# Patient Record
Sex: Female | Born: 1945
Health system: Southern US, Community
[De-identification: ages and names within clinical notes are randomized; demographics above are authoritative.]

## PROBLEM LIST (undated history)

## (undated) DIAGNOSIS — E119 Type 2 diabetes mellitus without complications: Secondary | ICD-10-CM

## (undated) DIAGNOSIS — E785 Hyperlipidemia, unspecified: Secondary | ICD-10-CM

## (undated) DIAGNOSIS — B059 Measles without complication: Secondary | ICD-10-CM

## (undated) DIAGNOSIS — M1990 Unspecified osteoarthritis, unspecified site: Secondary | ICD-10-CM

## (undated) DIAGNOSIS — N993 Prolapse of vaginal vault 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: Secondary | ICD-10-CM

## (undated) DIAGNOSIS — I10 Essential (primary) hypertension: Secondary | ICD-10-CM

## (undated) DIAGNOSIS — K635 Polyp of colon: Secondary | ICD-10-CM

## (undated) DIAGNOSIS — K08109 Complete loss of teeth, unspecified cause, unspecified class: Secondary | ICD-10-CM

## (undated) DIAGNOSIS — D649 Anemia, unspecified: Secondary | ICD-10-CM

## (undated) DIAGNOSIS — K259 Gastric ulcer, unspecified as acute or chronic, without hemorrhage or perforation: Secondary | ICD-10-CM

## (undated) DIAGNOSIS — Z96642 Presence of left artificial hip joint: Secondary | ICD-10-CM

## (undated) DIAGNOSIS — Z96659 Presence of unspecified artificial knee joint: Secondary | ICD-10-CM

## (undated) DIAGNOSIS — Z960 Presence of urogenital implants: Secondary | ICD-10-CM

## (undated) DIAGNOSIS — E876 Hypokalemia: Secondary | ICD-10-CM

## (undated) DIAGNOSIS — N3946 Mixed incontinence: Secondary | ICD-10-CM

## (undated) DIAGNOSIS — Z972 Presence of dental prosthetic device (complete) (partial): Secondary | ICD-10-CM

## (undated) DIAGNOSIS — N183 Chronic kidney disease, stage 3 unspecified: Secondary | ICD-10-CM

## (undated) DIAGNOSIS — N189 Chronic kidney disease, unspecified: Secondary | ICD-10-CM

## (undated) DIAGNOSIS — G43909 Migraine, unspecified, not intractable, without status migrainosus: Secondary | ICD-10-CM

## (undated) DIAGNOSIS — Z780 Asymptomatic menopausal state: Secondary | ICD-10-CM

## (undated) DIAGNOSIS — Z973 Presence of spectacles and contact lenses: Secondary | ICD-10-CM

## (undated) HISTORY — DX: Anemia, unspecified: D64.9

## (undated) HISTORY — DX: Presence of left artificial hip joint: Z96.642

## (undated) HISTORY — PX: CATARACT EXTRACTION: SUR2

## (undated) HISTORY — DX: Hypokalemia: E87.6

## (undated) HISTORY — DX: Unspecified osteoarthritis, unspecified site: M19.90

## (undated) HISTORY — DX: Hyperlipidemia, unspecified: E78.5

## (undated) HISTORY — DX: Migraine, unspecified, not intractable, without status migrainosus: G43.909

## (undated) HISTORY — DX: Polyp of colon: K63.5

## (undated) HISTORY — DX: Presence of unspecified artificial knee joint: Z96.659

## (undated) HISTORY — DX: Chronic kidney disease, unspecified: N18.9

## (undated) HISTORY — PX: RETINAL DETACHMENT SURGERY: SHX105

## (undated) HISTORY — DX: Type 2 diabetes mellitus without complications: E11.9

## (undated) HISTORY — PX: JOINT REPLACEMENT: SHX530

## (undated) HISTORY — PX: CARPAL TUNNEL RELEASE: SHX101

## (undated) HISTORY — DX: Gastric ulcer, unspecified as acute or chronic, without hemorrhage or perforation: K25.9

## (undated) HISTORY — DX: Essential (primary) hypertension: I10

---

## 1998-01-21 ENCOUNTER — Ambulatory Visit (HOSPITAL_BASED_OUTPATIENT_CLINIC_OR_DEPARTMENT_OTHER): Admission: RE | Admit: 1998-01-21 | Discharge: 1998-01-21 | Payer: Self-pay | Admitting: Orthopedic Surgery

## 2000-11-07 ENCOUNTER — Other Ambulatory Visit: Admission: RE | Admit: 2000-11-07 | Discharge: 2000-11-07 | Payer: Self-pay | Admitting: Obstetrics and Gynecology

## 2001-05-26 ENCOUNTER — Ambulatory Visit (HOSPITAL_COMMUNITY): Admission: RE | Admit: 2001-05-26 | Discharge: 2001-05-27 | Payer: Self-pay | Admitting: Ophthalmology

## 2001-05-26 ENCOUNTER — Encounter: Payer: Self-pay | Admitting: Ophthalmology

## 2008-02-22 ENCOUNTER — Ambulatory Visit: Payer: Self-pay | Admitting: Obstetrics & Gynecology

## 2008-02-23 ENCOUNTER — Ambulatory Visit (HOSPITAL_COMMUNITY): Admission: RE | Admit: 2008-02-23 | Discharge: 2008-02-23 | Payer: Self-pay | Admitting: Obstetrics & Gynecology

## 2008-04-22 ENCOUNTER — Encounter: Payer: Self-pay | Admitting: Obstetrics & Gynecology

## 2008-04-22 ENCOUNTER — Inpatient Hospital Stay (HOSPITAL_COMMUNITY): Admission: RE | Admit: 2008-04-22 | Discharge: 2008-04-23 | Payer: Self-pay | Admitting: Obstetrics & Gynecology

## 2008-04-22 ENCOUNTER — Ambulatory Visit: Payer: Self-pay | Admitting: Obstetrics & Gynecology

## 2008-05-30 ENCOUNTER — Ambulatory Visit: Payer: Self-pay | Admitting: Obstetrics & Gynecology

## 2008-07-31 ENCOUNTER — Ambulatory Visit: Payer: Self-pay | Admitting: Obstetrics & Gynecology

## 2008-08-22 ENCOUNTER — Ambulatory Visit: Payer: Self-pay | Admitting: Obstetrics & Gynecology

## 2008-09-20 ENCOUNTER — Ambulatory Visit: Payer: Self-pay | Admitting: Obstetrics & Gynecology

## 2008-09-20 ENCOUNTER — Encounter: Payer: Self-pay | Admitting: Obstetrics and Gynecology

## 2008-09-23 ENCOUNTER — Ambulatory Visit (HOSPITAL_COMMUNITY): Admission: RE | Admit: 2008-09-23 | Discharge: 2008-09-23 | Payer: Self-pay | Admitting: Obstetrics and Gynecology

## 2008-10-11 ENCOUNTER — Ambulatory Visit: Payer: Self-pay | Admitting: Obstetrics & Gynecology

## 2009-03-15 DIAGNOSIS — Z96642 Presence of left artificial hip joint: Secondary | ICD-10-CM

## 2009-03-15 HISTORY — DX: Presence of left artificial hip joint: Z96.642

## 2009-03-15 HISTORY — PX: HIP ARTHROPLASTY: SHX981

## 2009-03-19 ENCOUNTER — Ambulatory Visit: Payer: Self-pay | Admitting: Family Medicine

## 2009-04-19 ENCOUNTER — Encounter: Payer: Self-pay | Admitting: Cardiology

## 2009-09-29 ENCOUNTER — Ambulatory Visit: Payer: Self-pay | Admitting: Obstetrics & Gynecology

## 2009-10-09 ENCOUNTER — Ambulatory Visit (HOSPITAL_COMMUNITY): Admission: RE | Admit: 2009-10-09 | Discharge: 2009-10-09 | Payer: Self-pay | Admitting: Obstetrics & Gynecology

## 2009-10-21 ENCOUNTER — Encounter
Admission: RE | Admit: 2009-10-21 | Discharge: 2009-10-21 | Payer: Self-pay | Source: Home / Self Care | Admitting: Obstetrics & Gynecology

## 2009-10-24 ENCOUNTER — Encounter: Admission: RE | Admit: 2009-10-24 | Discharge: 2009-10-24 | Payer: Self-pay | Admitting: Obstetrics & Gynecology

## 2010-02-16 ENCOUNTER — Encounter: Payer: Self-pay | Admitting: Cardiology

## 2010-02-17 ENCOUNTER — Encounter: Payer: Self-pay | Admitting: Cardiology

## 2010-02-25 ENCOUNTER — Ambulatory Visit: Payer: Self-pay | Admitting: Cardiology

## 2010-02-25 DIAGNOSIS — E663 Overweight: Secondary | ICD-10-CM

## 2010-02-25 DIAGNOSIS — I10 Essential (primary) hypertension: Secondary | ICD-10-CM

## 2010-02-25 DIAGNOSIS — E1159 Type 2 diabetes mellitus with other circulatory complications: Secondary | ICD-10-CM | POA: Insufficient documentation

## 2010-02-26 ENCOUNTER — Encounter: Payer: Self-pay | Admitting: Cardiology

## 2010-02-26 ENCOUNTER — Ambulatory Visit (HOSPITAL_COMMUNITY)
Admission: RE | Admit: 2010-02-26 | Discharge: 2010-02-26 | Payer: Self-pay | Source: Home / Self Care | Attending: Internal Medicine | Admitting: Internal Medicine

## 2010-02-26 ENCOUNTER — Ambulatory Visit: Payer: Self-pay

## 2010-02-27 ENCOUNTER — Encounter: Payer: Self-pay | Admitting: Cardiology

## 2010-02-27 ENCOUNTER — Telehealth: Payer: Self-pay | Admitting: Cardiology

## 2010-03-02 ENCOUNTER — Inpatient Hospital Stay (HOSPITAL_COMMUNITY)
Admission: RE | Admit: 2010-03-02 | Discharge: 2010-03-06 | Payer: Self-pay | Source: Home / Self Care | Attending: Orthopedic Surgery | Admitting: Orthopedic Surgery

## 2010-03-15 HISTORY — PX: TOTAL VAGINAL HYSTERECTOMY: SHX2548

## 2010-03-30 ENCOUNTER — Ambulatory Visit: Admit: 2010-03-30 | Payer: Self-pay | Admitting: Obstetrics & Gynecology

## 2010-04-05 ENCOUNTER — Encounter: Payer: Self-pay | Admitting: Obstetrics & Gynecology

## 2010-04-15 NOTE — Discharge Summary (Signed)
[name], [name]               ACCOUNT NO.:  [id number]  MEDICAL RECORD NO.:  [id number]          PATIENT TYPE:  INP  LOCATION:  1617                         FACILITY:  [facility]  PHYSICIAN:  Ollen Gross, M.D.    DATE OF BIRTH:  1946-01-29  DATE OF ADMISSION:  03/02/2010 DATE OF DISCHARGE:  03/06/2010                              DISCHARGE SUMMARY   ADMITTING DIAGNOSES: 1. Osteoarthritis, left hip. 2. Migraines. 3. Cataracts. 4. Borderline hypertension. 5. Degenerative disk disease. 6. Postmenopausal. 7. Childhood illnesses of measles, mumps, and rubella.  DISCHARGE DIAGNOSES: 1. Osteoarthritis, left hip status post left total hip replacement     arthroplasty. 2. Postop acute blood loss anemia. 3. Postop hypokalemia, mild. 4. Postop orthostatic hypotension. 5. Remaining discharge diagnoses same as admitting diagnoses.  PROCEDURE:  On March 02, 2010, left total hip.  Surgeon: Dr. Lequita Halt. Assistant Avel Peace, PA-C.  Anesthesia: General.  Blood loss 300 mL.  CONSULTS:  None.  BRIEF HISTORY:  Ms Misty Cervantes is a 65 year old female who has had advanced end-stage arthritis of left hip with progressive worsening pain dysfunction.  She has failed nonoperative management and now presents for a total hip arthroplasty.  LABORATORY DATA:  Preop CBC showed a hemoglobin of 14.5, hematocrit of 44.6, white cell count 5.1, platelets 231.  Postop hemoglobin 11.5 and then 10.4, and got as low as 9.3.  Her last hemoglobin and hematocrit at discharge was 8.9 with a hematocrit of 27.6.  PT/INR 12.7 and 0.93 with a PTT of 28.  Serial pro time followed per Coumadin protocol.  Last PT/INR 20.0 and 1.68.  Chemistry panel on admission:  Elevated ALT at 50, low sodium of 134.  Remaining chemistry panel all within normal limits.  Serial BMETs were followed for 3 days.  Sodium came up to 138, potassium dropped to 3.9, and got as low as 3.4 were it stabilized. Preop UA:  Positive nitrite,  trace leukocyte esterase, 11-20 white cells, many bacteria addressed preoperatively.  Blood group type O+. Nasal swabs were negative for Staphylococcus aureus and negative for MRSA.  X-RAYS:  Left hip film:  Preop advanced arthritis, left hip.  Portable pelvis and left hip films show a left total hip replacement in good position and alignment.  HOSPITAL COURSE: 1. The patient was admitted to [facility], taken to OR, and     underwent the above-stated procedure without complication.  The     patient tolerated the procedure well and later transferred to the     recovery room on orthopedic floor.  Started on p.o. and IV     analgesic pain control following surgery and had decent pain     control on day 1, diuresing fluids well.  Day 1 along the evening     of surgery, doing well.  On day 1, started to get up with partial     weightbearing. 2. Borderline hypertension.  Her blood pressure medications were     started back on her parameter.  By day two, she was doing well with     therapy.  Blood pressure was stable.  Dressing changes and incision  looked good.  Hemoglobin was 10.4 by day 3.  There was a     possibility for going home; however, when she was getting up with     therapy, she was getting lightheaded and dizzy.  We checked     orthostatics, and she was found to be a little hypotensive with     change of position and felt to be volume issue, so we gave her     extra fluids and kept her that day.  She tolerated the fluids well.     By the following day, postop day #4, she was doing well.  She had     no dizziness with therapies, tolerating medications, and discharged     home.  DISCHARGE/PLAN: 1. The patient discharged home on March 14, 2010. 2. Discharge diagnoses: Please see above. 3. Discharge medications: Vicodin, Robaxin, Coumadin.  Continue her     amlodipine, ex-lax, Lasix, and potassium.  DIET:  Heart-healthy diet.  ACTIVITY:  She is partial  weightbearing, 25% to 30% in left lower extremity.  Hip precautions, total hip protocol.  FOLLOW-UP:  2 weeks.  DISPOSITION:  Home.  CONDITION ON DISCHARGE:  Improved.     Alexzandrew L. Julien Girt, P.A.C.   ______________________________ Ollen Gross, M.D.    ALP/MEDQ  D:  03/26/2010  T:  03/26/2010  Job:  [id number]  cc:   Ernestina Penna, M.D. Fax: [phone]  Electronically Signed by Patrica Duel P.A.C. on 04/02/2010 11:08:12 AM Electronically Signed by Ollen Gross M.D. on 04/15/2010 03:32:47 PM

## 2010-04-16 NOTE — Progress Notes (Signed)
Summary: status of echo results- surgery on monday  Phone Note Call from Patient Call back at [facility] Phone [phone]   Caller: Patient Reason for Call: Talk to Nurse, Lab or Test Results Summary of Call: echo done on yesterday @ 11:30. pt having surgery on monday. pt need to know if she is clear for surgery or not. Initial call taken by: Lorne Skeens,  February 27, 2010 12:34 PM  Follow-up for Phone Call        Per Cordelia Pen Dr Despina Hick ofc pt needs clearance today. Is she ok to have this done, ofc [phone] faz [phone]  February 27, 2010 2:34 PM  Per Dr Jacinto Reap - Echo is normal with NL EF of 65%, no sign. valvular issues.  Per Dr Antoine Poche if echo is normal - pt is OK for surgery.  Will fax note to surgeon Follow-up by: Charolotte Capuchin, RN,  February 27, 2010 5:09 PM

## 2010-04-16 NOTE — Assessment & Plan Note (Signed)
Summary: [facility]   Visit Type:  Pre-op Evaluation Primary Provider:  Dr. Elana Alm  CC:  Pre op and Abnormal EKG.  History of Present Illness: The patient presents for evaluation of an abnormal ECG. This was done preoperatively prior to hip replacement. This surgery is to be done Monday. However, her EKG demonstrated old anteroseptal infarct. There were inferior Q waves which did not meet criteria for an inferior infarct. She reports an abnormal EKG years ago and was evaluated apparently with an echo and perhaps other testing and was told she had no heart disease. She did undergo an hysterectomy last year without problems and was told that her EKG was abnormal then. She denies any cardiovascular symptoms. She is limited recently by hip pain but can still climb stairs. She denies any chest pressure, neck or arm discomfort. She does not report palpitations, presyncope or syncope. He has had no PND or orthopnea. She has had no weight gain or edema.   Current Medications (verified): 1)  Multivitamins   Tabs (Multiple Vitamin) .Marland Kitchen.. 1 By Mouth Daily 2)  Potassium 95 Mg Cr-Tabs (Potassium Gluconate) .Marland Kitchen.. 1 By Mouth Daily 3)  Diclofenac Sodium 75 Mg Tbec (Diclofenac Sodium) .Marland Kitchen.. 1 By Mouth Two Times A Day--As Needed 4)  Vicodin 5-500 Mg Tabs (Hydrocodone-Acetaminophen) .... As Needed 5)  Furosemide 40 Mg Tabs (Furosemide) .Marland Kitchen.. 1 By Mouth As Needed 6)  Amlodipine Besylate 5 Mg Tabs (Amlodipine Besylate) .Marland Kitchen.. 1 By Mouth Dialy  Allergies (verified): No Known Drug Allergies  Past History:  Past Medical History: Cataracts Recent hypertension  Past Surgical History: Hysterectomy Cataract Retinal repair  Family History: Mother died with diabetes, two sisters died with diabetes.  Social History: Retired, divorced, never smoked. Two children.  Review of Systems       As stated in the HPI and negative for all other systems.   Vital Signs:  Patient profile:   65 year old  female Height:      60 inches Weight:      162 pounds BMI:     31.75 Pulse rate:   65 / minute Resp:     18 per minute BP sitting:   138 / 88  (right arm)  Vitals Entered By: Marrion Coy, CNA (February 25, 2010 2:25 PM)  Physical Exam  General:  Well developed, well nourished, in no acute distress. Head:  normocephalic and atraumatic Eyes:  PERRLA/EOM intact; conjunctiva and lids normal. Mouth:  Teeth, gums and palate normal. Oral mucosa normal. Neck:  Neck supple, no JVD. No masses, thyromegaly or abnormal cervical nodes. Chest Wall:  no deformities or breast masses noted Lungs:  Clear bilaterally to auscultation and percussion. Abdomen:  Bowel sounds positive; abdomen soft and non-tender without masses, organomegaly, or hernias noted. No hepatosplenomegaly. Msk:  Back normal, normal gait. Muscle strength and tone normal. Extremities:  No clubbing or cyanosis. Neurologic:  Alert and oriented x 3. Skin:  Intact without lesions or rashes. Cervical Nodes:  no significant adenopathy Axillary Nodes:  no significant adenopathy Inguinal Nodes:  no significant adenopathy Psych:  Normal affect.   Detailed Cardiovascular Exam  Neck    Carotids: Carotids full and equal bilaterally without bruits.      Neck Veins: Normal, no JVD.    Heart    Inspection: no deformities or lifts noted.      Palpation: normal PMI with no thrills palpable.      Auscultation: regular rate and rhythm, S1, S2 without murmurs, rubs, gallops, or clicks.  Vascular    Abdominal Aorta: no palpable masses, pulsations, or audible bruits.      Femoral Pulses: normal femoral pulses bilaterally.      Pedal Pulses: normal pedal pulses bilaterally.      Radial Pulses: normal radial pulses bilaterally.      Peripheral Circulation: no clubbing, cyanosis, or edema noted with normal capillary refill.     EKG  Procedure date:  02/16/2010  Findings:      Sinus rhythm, rate 91, axis within normal limits, QTC  slightly prolonged, poor anterior R-wave progression suggestive of old anteroseptal infarct  Impression & Recommendations:  Problem # 1:  PREOPERATIVE EXAMINATION (ICD-V72.84)  The patient has no overt symptoms are reasonable functional level. She does have an abnormal EKG. I cannot find an old EKG for comparison. I would like to have an echocardiogram to make sure she has normal wall motion. If so I do not think further cardiovascular testing is suggested prior to surgery.  Orders: Echocardiogram (Echo)  Problem # 2:  ESSENTIAL HYPERTENSION, BENIGN (ICD-401.1) Her blood pressure control. She will continue the meds as listed.  Problem # 3:  OVERWEIGHT (ICD-278.02) she understands the need to lose weight with diet and exercise.  Patient Instructions: 1)  Your physician has requested that you have an echocardiogram.  Echocardiography is a painless test that uses sound waves to create images of your heart. It provides your doctor with information about the size and shape of your heart and how well your heart's chambers and valves are working.  This procedure takes approximately one hour. There are no restrictions for this procedure.

## 2010-04-16 NOTE — Letter (Signed)
Summary: Clearance Letter  [facility] [employer], Main Office  [address]   [place], [place] [address]   Phone: [phone]  Fax: [phone]        February 27, 2010  Re:     Caryn Section Address:   [address] [place], [place]  [address] DOB:     [birth year] MRN:     [id number]   Dear Dr. Lequita Halt,    The above patient has been cleared for surgery.       Sincerely,      Charolotte Capuchin, RN for Dr. Rollene Rotunda

## 2010-04-17 ENCOUNTER — Inpatient Hospital Stay (HOSPITAL_COMMUNITY): Admission: RE | Admit: 2010-04-17 | Payer: MEDICARE | Source: Home / Self Care | Admitting: Orthopedic Surgery

## 2010-05-14 ENCOUNTER — Ambulatory Visit: Payer: Self-pay | Admitting: Obstetrics and Gynecology

## 2010-05-25 LAB — CBC
HCT: 27.6 % — ABNORMAL LOW (ref 36.0–46.0)
HCT: 28.5 % — ABNORMAL LOW (ref 36.0–46.0)
HCT: 29.2 % — ABNORMAL LOW (ref 36.0–46.0)
HCT: 31.7 % — ABNORMAL LOW (ref 36.0–46.0)
Hemoglobin: 10.4 g/dL — ABNORMAL LOW (ref 12.0–15.0)
Hemoglobin: 11.5 g/dL — ABNORMAL LOW (ref 12.0–15.0)
Hemoglobin: 8.9 g/dL — ABNORMAL LOW (ref 12.0–15.0)
MCH: 31.7 pg (ref 26.0–34.0)
MCH: 31.9 pg (ref 26.0–34.0)
MCHC: 32.2 g/dL (ref 30.0–36.0)
MCHC: 32.8 g/dL (ref 30.0–36.0)
MCHC: 33 g/dL (ref 30.0–36.0)
MCV: 97.2 fL (ref 78.0–100.0)
MCV: 97.5 fL (ref 78.0–100.0)
MCV: 97.6 fL (ref 78.0–100.0)
Platelets: 189 10*3/uL (ref 150–400)
Platelets: 194 K/uL (ref 150–400)
Platelets: 198 10*3/uL (ref 150–400)
Platelets: 220 10*3/uL (ref 150–400)
Platelets: 237 10*3/uL (ref 150–400)
RBC: 2.92 MIL/uL — ABNORMAL LOW (ref 3.87–5.11)
RBC: 3 MIL/uL — ABNORMAL LOW (ref 3.87–5.11)
RBC: 3.26 MIL/uL — ABNORMAL LOW (ref 3.87–5.11)
RBC: 3.6 MIL/uL — ABNORMAL LOW (ref 3.87–5.11)
RDW: 12.3 % (ref 11.5–15.5)
WBC: 5.1 10*3/uL (ref 4.0–10.5)
WBC: 6.2 10*3/uL (ref 4.0–10.5)
WBC: 7.6 K/uL (ref 4.0–10.5)
WBC: 7.7 10*3/uL (ref 4.0–10.5)
WBC: 8 10*3/uL (ref 4.0–10.5)

## 2010-05-25 LAB — BASIC METABOLIC PANEL WITH GFR
BUN: 7 mg/dL (ref 6–23)
CO2: 28 meq/L (ref 19–32)
Calcium: 8.3 mg/dL — ABNORMAL LOW (ref 8.4–10.5)
Chloride: 103 meq/L (ref 96–112)
Creatinine, Ser: 0.66 mg/dL (ref 0.4–1.2)
GFR calc non Af Amer: >60 mL/min
Glucose, Bld: 141 mg/dL — ABNORMAL HIGH (ref 70–99)
Potassium: 4 meq/L (ref 3.5–5.1)
Sodium: 137 meq/L (ref 135–145)

## 2010-05-25 LAB — ABO/RH: ABO/RH(D): O POS

## 2010-05-25 LAB — PROTIME-INR
INR: 1.02 (ref 0.00–1.49)
INR: 1.26 (ref 0.00–1.49)
INR: 1.33 (ref 0.00–1.49)
Prothrombin Time: 13.6 s (ref 11.6–15.2)
Prothrombin Time: 16 s — ABNORMAL HIGH (ref 11.6–15.2)
Prothrombin Time: 16.7 s — ABNORMAL HIGH (ref 11.6–15.2)
Prothrombin Time: 20 seconds — ABNORMAL HIGH (ref 11.6–15.2)

## 2010-05-25 LAB — BASIC METABOLIC PANEL
BUN: 12 mg/dL (ref 6–23)
BUN: 6 mg/dL (ref 6–23)
CO2: 29 mEq/L (ref 19–32)
Calcium: 8.3 mg/dL — ABNORMAL LOW (ref 8.4–10.5)
GFR calc non Af Amer: >60 mL/min (ref >60)
Glucose, Bld: 144 mg/dL — ABNORMAL HIGH (ref 70–99)
Sodium: 135 mEq/L (ref 135–145)
Sodium: 138 mEq/L (ref 135–145)

## 2010-05-25 LAB — TYPE AND SCREEN

## 2010-05-26 LAB — URINALYSIS, ROUTINE W REFLEX MICROSCOPIC
Ketones, ur: NEGATIVE mg/dL
Nitrite: POSITIVE — AB
Protein, ur: NEGATIVE mg/dL

## 2010-05-26 LAB — CBC
MCH: 31.7 pg (ref 26.0–34.0)
Platelets: 231 10*3/uL (ref 150–400)
WBC: 5.1 10*3/uL (ref 4.0–10.5)

## 2010-05-26 LAB — PROTIME-INR: Prothrombin Time: 12.7 seconds (ref 11.6–15.2)

## 2010-05-26 LAB — COMPREHENSIVE METABOLIC PANEL
Albumin: 4.1 g/dL (ref 3.5–5.2)
Alkaline Phosphatase: 74 U/L (ref 39–117)
Chloride: 101 mEq/L (ref 96–112)
Glucose, Bld: 108 mg/dL — ABNORMAL HIGH (ref 70–99)
Sodium: 134 mEq/L — ABNORMAL LOW (ref 135–145)

## 2010-05-26 LAB — URINE MICROSCOPIC-ADD ON

## 2010-05-26 LAB — APTT: aPTT: 28 seconds (ref 24–37)

## 2010-06-08 ENCOUNTER — Ambulatory Visit: Payer: MEDICARE | Admitting: Advanced Practice Midwife

## 2010-06-08 ENCOUNTER — Ambulatory Visit: Payer: Self-pay | Admitting: Obstetrics and Gynecology

## 2010-06-08 DIAGNOSIS — N814 Uterovaginal prolapse, unspecified: Secondary | ICD-10-CM

## 2010-06-09 NOTE — Progress Notes (Unsigned)
NAMEMarland Kitchen  SAVREEN, GEBHARDT NO.:  [id number]  MEDICAL RECORD NO.:  [id number]           PATIENT TYPE:  A  LOCATION:  [facility]                   FACILITY:  [facility]  PHYSICIAN:  Wynelle Bourgeois, CNM    DATE OF BIRTH:  1945-03-29  DATE OF SERVICE:  06/08/2010                                 CLINIC NOTE  This is a 65 year old gravida 2, para 0 who is here today for pessary recheck.  She underwent a TVH with anterior repair for cystocele in 2010, by Dr. Marice Potter for an uterine prolapse in the third-degree and symptomatic cystocele.  She had a total vaginal hysterectomy and tension- free vaginal tape with anterior repair done.  She has done well but redeveloped a cystocele after surgery and was having some incontinence with that.  She was treated with a pessary which she originally was wearing every other day and got relief from that with no problems. Recently she has not had where her pessary very much.  She said she only wears when she is on her feet for long time and does not have leaking very often.  She does feels some pressure when she sits on the toilet and feels like she can feel the bulging through the vagina.  She has recently had a total hip replacement 3 months ago and has done well with that and is currently walking 2 miles a day.  PHYSICAL EXAMINATION:  VITAL SIGNS:  Temperature 97.5, pulse 77, blood pressure 147/86, weight 160.8 which is down 4-1/2 pounds. GU:  External genitalia normal with some atrophy.  Vagina is atrophic. Cystocele is noted with Valsalva and also possible small rectocele. There are no vaginal wall erosions and no abnormal discharge or lesions. She does not have her pessary with her today but states that it still fits well and is not causing any discomfort.  IMPRESSION: 28. A 65 year old gravida 2, para 2 female with a vaginal wall defect     status post repair. 2. Status post hip replacement on the left knee  PLAN:  The patient reassured  that vagina is without signs of any trauma. She will continue to use her pessary as needed.  She asked about having another repair since she redeveloped her cystocele, but we discussed the fact that she is not having very much incontinence with it and is doing well with her pessary, so the patient will think about it and if she decides that she is interested in any more surgery, she will come back and make an appointment with Dr. Marice Potter to discuss that, but for now she wants to just keep on the same plan that she has been for now and continue her followup with orthopedics.          ______________________________ Wynelle Bourgeois, CNM    MW/MEDQ  D:  06/08/2010  T:  06/09/2010  Job:  [id number]

## 2010-06-30 LAB — CBC
HCT: 34.9 % — ABNORMAL LOW (ref 36.0–46.0)
HCT: 38.9 % (ref 36.0–46.0)
Hemoglobin: 11.7 g/dL — ABNORMAL LOW (ref 12.0–15.0)
MCHC: 33.5 g/dL (ref 30.0–36.0)
MCHC: 34 g/dL (ref 30.0–36.0)
MCV: 95.4 fL (ref 78.0–100.0)
MCV: 95.7 fL (ref 78.0–100.0)
Platelets: 214 10*3/uL (ref 150–400)
RDW: 12.6 % (ref 11.5–15.5)
RDW: 12.7 % (ref 11.5–15.5)

## 2010-07-28 NOTE — Group Therapy Note (Signed)
NAME:  Misty Cervantes, Misty Cervantes NO.:  [id number]   MEDICAL RECORD NO.:  [id number]          PATIENT TYPE:  WOC   LOCATION:  [facility]                   FACILITY:  [facility]   PHYSICIAN:  Elsie Lincoln, MD      DATE OF BIRTH:  1946/02/12   DATE OF SERVICE:  10/11/2008                                  CLINIC NOTE   This is a 65 year old Caucasian female who is here for results of MRI  lumbar spine, which was done on September 23, 2008.  Since last seen here by  Dr. Okey Dupre on September 20, 2008, she has continued to have left lower back  pain, which radiates into her left groin.  She has difficulty in  changing position from standing to sitting and vice versa.  She does  have an appointment Dr. Penni Bombard, [facility], appointment was  changed to October 24, 2008.  She is requesting more analgesia.  She  states that for postop pain she did better with Vicodin and Tylenol No.  3, so would like to have that instead.   As far as her GYN issues, she is 7 months status post TVH and anterior  repair done by Drs. Marice Potter and Debroah Loop and  since then she has a pessary  for her vaginal wall prolapse with cystocele and she is doing well with  that.  Her next appointment for recheck of that is already made and she  is happy with the pessary.   OBJECTIVE:  Gait is guarded and she does have apparent pain with  position changes.  Orthopedic exam is deferred.  Please see MRI report  of September 24, 2008, regarding her degenerative joint disease of the lumbar  spine and central disk protrusion at L2 and L3.   PLAN:  The results were discussed, she declined handout from [place], and  will be looking up her condition on the internet herself prior to her  repeat appointment at Orthopedics.  She was given a prescription for  Vicodin 5/500, #30, to take one every 4 hours as needed, not to exceed 5  per day, and she was given one refill on that prescription.   Addendum to above, of note, her creatinine is normal at  0.9 and her MRI  results have been faxed to Dr. Penni Bombard.     ______________________________  Caren Griffins, CNM    ______________________________  Elsie Lincoln, MD    DP/MEDQ  D:  10/11/2008  T:  10/12/2008  Job:  [id number]

## 2010-07-28 NOTE — Op Note (Signed)
NAME:  Misty Cervantes, Misty Cervantes               ACCOUNT NO.:  [id number]   MEDICAL RECORD NO.:  [id number]          PATIENT TYPE:  INP   LOCATION:  9305                          [facility]   PHYSICIAN:  Allie Bossier, MD        DATE OF BIRTH:  05/11/1945   DATE OF PROCEDURE:  DATE OF DISCHARGE:                               OPERATIVE REPORT   PREOPERATIVE DIAGNOSES:  1. General stress urinary incontinence.  2. Third-degree uterine prolapse.  3. Symptomatic cystocele.   POSTOPERATIVE DIAGNOSES:  1. General stress urinary incontinence.  2. Third-degree uterine prolapse.  3. Symptomatic cystocele.   PROCEDURE:  Total vaginal hysterectomy, tension-free vaginal tape, and  anterior repair.   SURGEON:  Allie Bossier, MD   ASSISTANT:  Scheryl Darter, MD   ANESTHESIA:  General anesthesia.   COMPLICATIONS:  None.   ESTIMATED BLOOD LOSS:  200 mL.   SPECIMENS:  Uterus.   FINDINGS:  Atrophic adnexa.   DETAILED PROCEDURE AND FINDINGS:  The risks, benefits, alternatives of  the surgery were explained, understood, and accepted.  Consents were  signed.  She was taken to the operating room and once she was  comfortable in the dorsal lithotomy position, general anesthesia was  applied without complication.  Her vagina and lower abdomen were prepped  and draped in usual sterile fashion.  Bimanual exam revealed nonpalpable  adnexa and a small anteverted mobile uterus.  With traction, the uterus  with prolapse to the introitus.  At the beginning of the case, a  weighted speculum was placed posteriorly, Deaver anteriorly and the  cervix was grasped with single-tooth tenaculum.  A mixture of injectable  solution was made up, it consisted of 30 mL of 0.5% Marcaine and 100 mL  of injectable saline.  The mixture injection was used throughout this  case.  Approximately 80 mL of this mixture was injected in a  circumferential fashion at the cervicovaginal junction.  Circumferential  incision was made at the  same site.  The posterior peritoneum was  entered, tagged, and held.  The anterior peritoneum was entered, tagged,  and held.  Please note that 2-0 Vicryl suture was used throughout this  case once otherwise specified.  The uterosacral ligaments were  identified, clamped, cut, and doubly ligated.  These were tagged and  held.  The remainder of the pelvic attachments of the very small uterus  were clamped, cut, and ligated.  All pedicles were noted to be  hemostatic.  Once the very tiny uterus was removed, I placed a sponge  stick to keep the bowel out of the way and I looked at all pedicle, they  were all hemostatic.  I was able to visualize the ovaries, they were no  bigger than half a centimeter of each and they were very small and  appeared quite normal, but atrophic.  I left them in situ because I  deemed that it would be dangerous to try to remove them since they were  so small.  At this point, I used the tagged uterosacral ligament sutures  to do a mattress  suture at each angle for vaginal support in the future.  The cuff was hemostatic.  The peritoneum was closed in a pursestring  fashion.  I then proceeded with the anterior repair portion of the case.  The vaginal mucosa was injected with the injection solution for  hydrodissection.  A vertical incision was made from the middle of the  vaginal cuff towards the urethra, stopped about 2 cm distal to the  urethra.  The overlying vaginal mucosa was sharply and bluntly dissected  off the underlying vesicovaginal fashion.  The defect was closed with  interrupted 2-0 Vicryl sutures.  The excess vaginal mucosa was excised  and the vaginal mucosa was then closed with a 2-0 Vicryl running locking  suture.  Excellent cosmetic results were obtained.  The bladder was in a  normal anatomic position at this point.  I then closed the remainder of  the vaginal cuff in a running locking fashion with 2-0 Vicryl suture.  Excellent hemostasis was noted  throughout the vagina.  I then proceeded  with a TVT portion.  I measured and marked 1.5 cm lateral to the midline  just behind the symphysis pubis.  I then measured and clamped the  vaginal mucosa approximately a centimeter below the urethral meatus.  I  made a vertical incision with a #11 blade and hydrodissected with the  solution.  The bladder was drained once more, clear urine was noted, and  the stylus was placed for bladder manipulation.  I began on the  patient's right-hand side with the GYNECARE tension-free vaginal tape  trocar.  I placed it with the bladder deviated well out of the operative  site.  I then proceeded to do the patient's left-hand side.  At the  completion, I removed the Foley and did a cystoscopy.  There were no  perforations noted.  The urine was clear.  The Foley was again placed in  the bladder and was emptied.  The tension-free vaginal tapes were cut,  and there was no tension between the tape and the urethra as I placed a  Kelly clamp in between those walls elevating the tapes.  The tapes were  cut at the skin level, skin was closed with a Steri-Strip.  The vaginal  incision was then closed with a 3-0 Vicryl running locking suture  yielding excellent hemostasis.  She was extubated and taken to recovery  room in stable condition.  Instrument, sponge, and needle counts were  correct.      Allie Bossier, MD  Electronically Signed     MCD/MEDQ  D:  04/22/2008  T:  04/23/2008  Job:  [id number]

## 2010-07-28 NOTE — Group Therapy Note (Signed)
NAME:  Misty, Cervantes NO.:  [id number]   MEDICAL RECORD NO.:  [id number]          PATIENT TYPE:  WOC   LOCATION:  [facility]                   FACILITY:  [facility]   PHYSICIAN:  Allie Bossier, MD        DATE OF BIRTH:  January 23, 1946   DATE OF SERVICE:                                  CLINIC NOTE   HISTORY OF PRESENT ILLNESS:  Misty Cervantes is a 65 year old divorced white  gravida 2, para 2 who comes here as a referral from the health  department for a prolapsed uterus.  She says that she noted that her  uterus fell down approximately one year ago, but has become much worse  in the last several months.  She has had increasing pain and pelvic  pressure, especially in her lower back and her vaginal area.  She has  had to manually replace her uterus.  She has done internet searches and  feels that she might have a fibroid as well.  She denies any problems  with bowel movements.  She about a bowel movement three times a day and  does not have to require splinting.  She does have stress urinary  incontinence.   PAST MEDICAL HISTORY:  1. Obesity.  2. Stress incontinence.  3. Enlarged uterus uterine prolapse.   REVIEW OF SYSTEMS:  She is unemployed.  She has been divorced for the  last 20 years and lives with her son.  She has been abstinent for the  last 2 years.   PAST SURGICAL HISTORY:  She has had right detached retina repair.  She  has had bilateral cataract surgeries and a rectal abscess drainage in  the distant past.   ALLERGIES:  NO LATEX ALLERGIES.  NO DRUG ALLERGIES.   GYNECOLOGY HISTORY:  Her Pap smear done at the health department was  negative in October 2009.   PHYSICAL EXAMINATION:  VITAL SIGNS:  Weight 164 pounds, height 5 feet 1  inch, blood pressure 140/85, pulse 77, afebrile.  HEENT:  Normal.  HEART:  Regular rate and rhythm.  ABDOMEN:  Obese, benign.  EXTERNAL GENITALIA:  Positive for third degree prolapse.  There is  hypertrophy noted of her cervix.  This  was also seen on her Pap smear.  With bimanual exam,m I am able to palpate the prolapsed uterus up to 2  cm below the umbilicus.  This is consistent with a fibroid.   ASSESSMENT/PLAN:  1. Enlarged uterus on exam.  I will order an ultrasound.  2. Symptomatic uterine prolapse, and generous stress urinary      incontinence.  Will go ahead and turn in the      paperwork to schedule a TAH-BSO and Burch urethropexy as surgeries      are now not even being scheduled until March or April, but in the      meantime, I will have an ultrasound done.      Allie Bossier, MD     MCD/MEDQ  D:  02/22/2008  T:  02/22/2008  Job:  [id number]

## 2010-07-28 NOTE — Group Therapy Note (Signed)
NAME:  Misty Cervantes, MANS NO.:  [id number]   MEDICAL RECORD NO.:  [id number]          PATIENT TYPE:  WOC   LOCATION:  [facility]                   FACILITY:  [facility]   PHYSICIAN:  Argentina Donovan, MD        DATE OF BIRTH:  05/03/1945   DATE OF SERVICE:  09/20/2008                                  CLINIC NOTE   The patient is a 65 year old, white female, who underwent total vaginal  hysterectomy with tension-free vaginal tape and anterior repair about 7  months ago.  She came back 2 months ago because of pelvic pressure and  she is having a recurrence of some amount of vaginal vault prolapse with  cystocele.  She was given a pessary to use which she is able to put in  and remove herself which left when last seen 6 weeks ago gave her a very  good relief.  Since that time, however, she has developed severe pain on  her left groin and lower back that radiates down into her left leg,  thigh, and into her foot even makes it difficult sometimes for her to  walk and she tends to have some pressure and pain when you push on the  sacroiliac area and when we try and extend or retract the leg on that  side.  I think that she has probably some type of nerve compression, and  I am going to get an MRI of her lumbar spine and have or come back for  results in a couple weeks, and then decide whether refer her to a  neurosurgeon or an orthopedic doctor at that point.   IMPRESSION:  Recurrent vaginal wall prolapse with cystocele, severe back  pain with radiation to left groin, and leg into foot.           ______________________________  Argentina Donovan, MD     PR/MEDQ  D:  09/20/2008  T:  09/20/2008  Job:  [id number]

## 2010-07-31 NOTE — Op Note (Signed)
[facility]  Patient:    Misty Cervantes, Misty Cervantes Visit Number: [id number] MRN: [id number]          Service Type: DSU Location: [phone] Attending Physician:  Ernesto Rutherford Dictated by:   Ernesto Rutherford, M.D. Proc. 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  PROCEDURE: 1. Scleral buckle with 287, 240, and 70 elements combined with retinal    cryopexy, OD. 2. External drainage of subretinal fluid, OD.  SURGEON:  Ernesto Rutherford, M.D.  ANESTHESIA:  General endotracheal anesthesia.  INDICATION FOR PROCEDURE:  The patient is a 65 year old woman with profound visual loss of the right eye on the basis of rhegmatogenous retinal detachment.  This is an attempt to reattach the retina and allow for visual acuity improvement.  The patient understands the risks and benefits of anesthesia including the occurence of death, possible injury to the eye including hemorrhage, infection, scarring, need for further surgery, no change in vision, loss of vision or progression of the disease despite intervention.  DESCRIPTION OF PROCEDURE:  After appropriate signed consent was obtained, the patient was taken to the operating room.  In the operating room, general endotracheal anesthesia was instituted without difficulty. The right eye perioperative region was shaved, prepped, and draped in the usual ophthalmic fashion. A lid speculum applied to the conjunctiva in routine fashion. A 360 degrees relaxing incision was made superotemporally and inferonasally. At this time, the rectus muscle was isolated with 2-0 silk ties.   Indirect ophthalmoscopy confirmed the presence of a retinal tear centered on the positioner approximately at 11 oclock.  The edges of the tear were surrounded with retinal cryopexy under direct observation.  The posterior margin of the break was then marked with a sterile diathermy for positioning on the scleral buckle.  A type 287 solid silicone implant was selected and passed under each of the rectus muscles.  The therapeutic paracentesis was performed to allow for softening of the globe to allow for passing of the buckle.  At this time, the buckle was placed under the lateral superior rectus, and the band was placed under all the muscles and joined in the inferonasal quadrant with 7-0 Watzke sleeve.  Temporary 5-0 Mersilene mattress sutures were placed in each of the quadrants.  At this time, external drainage of subretinal fluid then indirect ophthalmoscopy was then carried out in the superotemporal quadrant.  All subretinal fluid in this fashion was drained.  Continuous tension was applied to the globe so as to avoid softening.  At this time, the Mersilene mattress sutures were tied permanently.  At this time, the bed of the buckle was irrigated with Bug Juice.  The conjunctiva and Tenons were brought forward and closed with interrupted and running 7-0 Vicryl sutures.  Subconjunctival injection of antibiotics were applied. Intraocular pressure was found to be adequate.  Indirect ophthalmoscopy confirmed all subretinal fluid had been drained.  The optic nerve was perfused.  The patient was awakened from anesthesia and taken to the recovery room in good and stable condition. Dictated by:   Ernesto Rutherford, M.D. Attending Physician:  Ernesto Rutherford  DD:  05/26/01 TD:  05/29/01 Job: [id number]

## 2010-08-12 ENCOUNTER — Other Ambulatory Visit: Payer: Self-pay | Admitting: Obstetrics & Gynecology

## 2010-08-12 DIAGNOSIS — R921 Mammographic calcification found on diagnostic imaging of breast: Secondary | ICD-10-CM

## 2010-08-24 ENCOUNTER — Ambulatory Visit
Admission: RE | Admit: 2010-08-24 | Discharge: 2010-08-24 | Disposition: A | Discharge status: Home / Self Care | Payer: Medicare Other | Source: Ambulatory Visit | Attending: Obstetrics & Gynecology | Admitting: Obstetrics & Gynecology

## 2010-08-24 DIAGNOSIS — R921 Mammographic calcification found on diagnostic imaging of breast: Secondary | ICD-10-CM

## 2010-10-16 ENCOUNTER — Encounter: Payer: Self-pay | Admitting: Cardiology

## 2011-01-20 ENCOUNTER — Other Ambulatory Visit: Payer: Self-pay | Admitting: Obstetrics & Gynecology

## 2011-01-20 DIAGNOSIS — R921 Mammographic calcification found on diagnostic imaging of breast: Secondary | ICD-10-CM

## 2011-02-03 ENCOUNTER — Ambulatory Visit
Admission: RE | Admit: 2011-02-03 | Discharge: 2011-02-03 | Disposition: A | Discharge status: Home / Self Care | Payer: Medicare Other | Source: Ambulatory Visit | Attending: Obstetrics & Gynecology | Admitting: Obstetrics & Gynecology

## 2011-02-03 DIAGNOSIS — R921 Mammographic calcification found on diagnostic imaging of breast: Secondary | ICD-10-CM

## 2011-03-16 DIAGNOSIS — Z8711 Personal history of peptic ulcer disease: Secondary | ICD-10-CM

## 2011-03-16 HISTORY — DX: Personal history of peptic ulcer disease: Z87.11

## 2011-10-14 ENCOUNTER — Other Ambulatory Visit: Payer: Self-pay | Admitting: Family Medicine

## 2011-10-14 DIAGNOSIS — R1012 Left upper quadrant pain: Secondary | ICD-10-CM

## 2011-10-15 ENCOUNTER — Inpatient Hospital Stay
Admission: RE | Admit: 2011-10-15 | Discharge: 2011-10-15 | Payer: Medicare Other | Source: Ambulatory Visit | Attending: Family Medicine | Admitting: Family Medicine

## 2011-10-15 ENCOUNTER — Ambulatory Visit
Admission: RE | Admit: 2011-10-15 | Discharge: 2011-10-15 | Disposition: A | Discharge status: Home / Self Care | Payer: Medicare Other | Source: Ambulatory Visit | Attending: Family Medicine | Admitting: Family Medicine

## 2011-10-15 DIAGNOSIS — R1012 Left upper quadrant pain: Secondary | ICD-10-CM

## 2011-11-08 ENCOUNTER — Encounter: Payer: Self-pay | Admitting: *Deleted

## 2011-11-08 ENCOUNTER — Other Ambulatory Visit: Payer: Self-pay | Admitting: Family Medicine

## 2011-11-08 DIAGNOSIS — N281 Cyst of kidney, acquired: Secondary | ICD-10-CM

## 2011-11-09 ENCOUNTER — Other Ambulatory Visit (INDEPENDENT_AMBULATORY_CARE_PROVIDER_SITE_OTHER): Payer: Medicare Other

## 2011-11-09 ENCOUNTER — Encounter: Payer: Self-pay | Admitting: Gastroenterology

## 2011-11-09 ENCOUNTER — Ambulatory Visit (INDEPENDENT_AMBULATORY_CARE_PROVIDER_SITE_OTHER): Payer: Medicare Other | Admitting: Gastroenterology

## 2011-11-09 VITALS — BP 102/70 | HR 88 | Ht 60.0 in | Wt 150.0 lb

## 2011-11-09 DIAGNOSIS — R110 Nausea: Secondary | ICD-10-CM

## 2011-11-09 DIAGNOSIS — D649 Anemia, unspecified: Secondary | ICD-10-CM

## 2011-11-09 DIAGNOSIS — Z79899 Other long term (current) drug therapy: Secondary | ICD-10-CM

## 2011-11-09 DIAGNOSIS — K760 Fatty (change of) liver, not elsewhere classified: Secondary | ICD-10-CM

## 2011-11-09 DIAGNOSIS — R1012 Left upper quadrant pain: Secondary | ICD-10-CM

## 2011-11-09 DIAGNOSIS — Z1211 Encounter for screening for malignant neoplasm of colon: Secondary | ICD-10-CM

## 2011-11-09 DIAGNOSIS — K7689 Other specified diseases of liver: Secondary | ICD-10-CM

## 2011-11-09 DIAGNOSIS — R1013 Epigastric pain: Secondary | ICD-10-CM

## 2011-11-09 LAB — CBC WITH DIFFERENTIAL/PLATELET
Eosinophils Relative: 3.5 % (ref 0.0–5.0)
HCT: 37.8 % (ref 36.0–46.0)
Hemoglobin: 12.5 g/dL (ref 12.0–15.0)
Lymphs Abs: 1.6 10*3/uL (ref 0.7–4.0)
Monocytes Relative: 7.8 % (ref 3.0–12.0)
Platelets: 245 10*3/uL (ref 150.0–400.0)
RBC: 4 Mil/uL (ref 3.87–5.11)
WBC: 5.3 10*3/uL (ref 4.5–10.5)

## 2011-11-09 LAB — FOLATE: Folate: 17.2 ng/mL (ref >5.9)

## 2011-11-09 LAB — IBC PANEL: Saturation Ratios: 14.8 % — ABNORMAL LOW (ref 20.0–50.0)

## 2011-11-09 NOTE — Progress Notes (Signed)
History of Present Illness:  This is a very pleasant 66 year old Caucasian female referred by Dr. Leodis Sias at [facility] for evaluation of 2 months of left lower quadrant discomfort described as a recurrent nagging sensation alleviated mostly by assuming the supine position. She denies dyspepsia or her acid reflux, but does have severe nausea with this pain, he gives a history of peptic ulcer disease some 34 years ago. She has been on diclofenac 75 mg twice a day for many years because of chronic back pain. Patient also uses when necessary hydrocodone for pain. There is no history of severe constipation, melena, hematochezia, or specific hepatobiliary or other general medical problems. She was recently treated for urinary tract infection, and had a rather severe allergic mucosal reaction to Bactrim medication. CT scan of the abdomen in June was unremarkable except for some left peripelvic renal cysts. Labs show a normal metabolic profile except for hyperlipidemia. The patient's family history is noncontributory except for a sister with autoimmune hepatitis. Patient follows a regular diet, and denies a specific food intolerances, anorexia or weight loss. She has had previous hip replacement surgery, and vaginal hysterectomy. She does suffer from hypertension but gives no history of MIs, angina, or arrhythmias.   ,I have reviewed this patient's present history, medical and surgical past history, allergies and medications.     ROS: The remainder of the 10 point ROS is negative   Allergies  Allergen Reactions  . Probiotic (Acidophilus)     Pt not sure of which probiotic it was   Outpatient Prescriptions Prior to Visit  Medication Sig Dispense Refill  . amLODipine (NORVASC) 5 MG tablet Take 5 mg by mouth daily.        . diclofenac (VOLTAREN) 75 MG EC tablet Take 75 mg by mouth 2 (two) times daily as needed.        . furosemide (LASIX) 40 MG tablet Take 40 mg by mouth as  needed.        Marland Kitchen HYDROcodone-acetaminophen (VICODIN) 5-500 MG per tablet Take 1 tablet by mouth every 6 (six) hours as needed.        . Multiple Vitamin (MULTIVITAMIN) tablet Take 1 tablet by mouth daily.        . Potassium 95 MG TBCR Take 1 tablet by mouth daily.        . Rosuvastatin Calcium (CRESTOR PO) Take by mouth. Takes every other day       Past Medical History  Diagnosis Date  . Cataract   . HTN (hypertension)   . Osteoarthritis   . Hyperlipemia    Past Surgical History  Procedure Date  . Total vaginal hysterectomy 2012   . Cataract extraction   . Retinal detachment surgery   . Hip arthroplasty 2011    Left    History   Social History  . Marital Status: Divorced    Spouse Name: N/A    Number of Children: N/A  . Years of Education: N/A   Occupational History  . retired    Social History Main Topics  . Smoking status: Never Smoker   . Smokeless tobacco: Never Used  . Alcohol Use: No  . Drug Use: No  . Sexually Active: None   Other Topics Concern  . None   Social History Narrative   Divorced, 2 children; retired.    Family History  Problem Relation Age of Onset  . Diabetes Mother     deceased  . Diabetes Sister  x2  . Kidney cancer Father        Physical Exam: Blood pressure 102/70, pulse 80 and regular, weight 150 pounds with a BMI of 29.83. General well developed well nourished patient in no acute distress, appearing their stated age Eyes PERRLA, no icterus, fundoscopic exam per opthamologist Skin no lesions noted Neck supple, no adenopathy, no thyroid enlargement, no tenderness Chest clear to percussion and auscultation Heart no significant murmurs, gallops or rubs noted Abdomen no hepatosplenomegaly masses or tenderness, BS normal.  Rectal inspection normal no fissures, or fistulae noted.  No masses or tenderness on digital exam. Stool guaiac negative. Extremities no acute joint lesions, edema, phlebitis or evidence of  cellulitis. Neurologic patient oriented x 3, cranial nerves intact, no focal neurologic deficits noted. Psychological mental status normal and normal affect.  Assessment and plan: Probable NSAID gastric ulceration with her left upper quadrant pain and severe nausea. I have placed her on Nexium 40 mg a day and scheduled endoscopic exam. This patient has not had previous colonoscopy which also has been scheduled as a colon cancer screening procedure. She had recent CT scan of the abdomen that was negative except for fatty infiltration of her liver. I have ordered CBC and anemia profile.  Encounter Diagnoses  Name Primary?  . Epigastric abdominal pain Yes  . Anemia

## 2011-11-09 NOTE — Patient Instructions (Addendum)
Colonoscopy has been scheduled. Go to the basement today for your labs. We sent your prescription for Nexium to your pharmacy

## 2011-11-11 ENCOUNTER — Ambulatory Visit
Admission: RE | Admit: 2011-11-11 | Discharge: 2011-11-11 | Disposition: A | Discharge status: Home / Self Care | Payer: Medicare Other | Source: Ambulatory Visit | Attending: Family Medicine | Admitting: Family Medicine

## 2011-11-11 DIAGNOSIS — N281 Cyst of kidney, acquired: Secondary | ICD-10-CM

## 2011-11-11 MED ORDER — IOHEXOL 300 MG/ML  SOLN
100.0000 mL | Freq: Once | INTRAMUSCULAR | Status: AC | PRN
  Administered 2011-11-11: 100 mL via INTRAVENOUS

## 2011-11-12 ENCOUNTER — Telehealth: Payer: Self-pay | Admitting: Gastroenterology

## 2011-11-12 MED ORDER — ESOMEPRAZOLE MAGNESIUM 40 MG PO CPDR: 40.0000 mg | DELAYED_RELEASE_CAPSULE | Freq: Every day | ORAL | Status: DC

## 2011-11-12 MED ORDER — PEG-KCL-NACL-NASULF-NA ASC-C 100 G PO SOLR: 1.0000 | Freq: Once | ORAL | Status: DC

## 2011-11-12 NOTE — Telephone Encounter (Signed)
Notified patient the prep and Nexium were sent to her pharmacy.

## 2011-11-17 ENCOUNTER — Ambulatory Visit (AMBULATORY_SURGERY_CENTER): Payer: Medicare Other | Admitting: Gastroenterology

## 2011-11-17 ENCOUNTER — Encounter: Payer: Self-pay | Admitting: Gastroenterology

## 2011-11-17 VITALS — BP 120/69 | HR 73 | Temp 98.3°F | Resp 17 | Ht 60.0 in | Wt 150.0 lb

## 2011-11-17 DIAGNOSIS — K635 Polyp of colon: Secondary | ICD-10-CM

## 2011-11-17 DIAGNOSIS — D126 Benign neoplasm of colon, unspecified: Secondary | ICD-10-CM

## 2011-11-17 DIAGNOSIS — K259 Gastric ulcer, unspecified as acute or chronic, without hemorrhage or perforation: Secondary | ICD-10-CM

## 2011-11-17 DIAGNOSIS — K2970 Gastritis, unspecified, without bleeding: Secondary | ICD-10-CM

## 2011-11-17 DIAGNOSIS — Z1211 Encounter for screening for malignant neoplasm of colon: Secondary | ICD-10-CM

## 2011-11-17 DIAGNOSIS — D649 Anemia, unspecified: Secondary | ICD-10-CM

## 2011-11-17 DIAGNOSIS — R1012 Left upper quadrant pain: Secondary | ICD-10-CM

## 2011-11-17 HISTORY — DX: Polyp of colon: K63.5

## 2011-11-17 MED ORDER — SODIUM CHLORIDE 0.9 % IV SOLN: 500.0000 mL | INTRAVENOUS | Status: DC

## 2011-11-17 NOTE — Op Note (Signed)
[facility] [address] [place], [address]   ENDOSCOPY PROCEDURE REPORT  PATIENT: Misty, Cervantes  MR#: [id number] BIRTHDATE: 03-07-1946 , 65  yrs. old GENDER: Female ENDOSCOPIST:David Hale Bogus, MD, Clementeen Graham REFERRED BY: Leodis Sias, M.D. PROCEDURE DATE:  11/17/2011 PROCEDURE:   EGD w/ biopsy for H.pylori ASA CLASS:    Class II INDICATIONS: luq pain. MEDICATION: There was residual sedation effect present from prior procedure and Propofol (Diprivan) 50 mg IV TOPICAL ANESTHETIC:  DESCRIPTION OF PROCEDURE:   After the risks and benefits of the procedure were explained, informed consent was obtained.  The LB GIF-H180 [id number]  endoscope was introduced through the mouth  and advanced to the second portion of the duodenum .  The instrument was slowly withdrawn as the mucosa was fully examined.    The upper, middle and distal third of the esophagus were carefully inspected and no abnormalities were noted.  The z-line was well seen at the GEJ.  The endoscope was pushed into the fundus which was normal including a retroflexed view.  The antrum, gastric body, first and second part of the duodenum were unremarkable.  A biopsy CLO was performed. Heaking fundal ulcer noted,see pictures. Retroflexed views revealed .    The scope was then withdrawn from the patient and the procedure completed.  COMPLICATIONS: There were no complications.   ENDOSCOPIC IMPRESSION: healing NSAID induced gastric ulcer in fundus,r/o H.pylori infection.  RECOMMENDATIONS: 1.  await biopsy results 2.  Avoid NSAIDS for two weeks 3.  continue PPI 4.  Rx CLO if positive    _______________________________ eSigned:  Mardella Layman, MD, [facility] 11/17/2011 9:10 AM   standard discharge

## 2011-11-17 NOTE — Patient Instructions (Addendum)

## 2011-11-17 NOTE — Progress Notes (Signed)
Patient did not experience any of the following events: a burn prior to discharge; a fall within the facility; wrong site/side/patient/procedure/implant event; or a hospital transfer or hospital admission upon discharge from the facility. (G8907) Patient did not have preoperative order for IV antibiotic SSI prophylaxis. (G8918)  

## 2011-11-17 NOTE — Op Note (Addendum)
[facility] [address] [place], [address]   COLONOSCOPY PROCEDURE REPORT  PATIENT: Misty Cervantes, Misty Cervantes  MR#: [id number] BIRTHDATE: [birth year] , 65  yrs. old GENDER: Female ENDOSCOPIST: Mardella Layman, MD, Clementeen Graham REFERRED BY:  Leodis Sias, M.D. PROCEDURE DATE:  11/17/2011 PROCEDURE:   Colonoscopy with biopsy ASA CLASS:   Class II INDICATIONS:average risk patient for colon cancer. MEDICATIONS: Propofol (Diprivan) 80 mg IV  DESCRIPTION OF PROCEDURE:   After the risks and benefits and of the procedure were explained, informed consent was obtained.  A digital rectal exam revealed no abnormalities of the rectum.    The LB CF-H180AL [id number]  endoscope was introduced through the anus and advanced to the cecum, which was identified by both the appendix and ileocecal valve .  The quality of the prep was excellent, using MoviPrep .  The instrument was then slowly withdrawn as the colon was fully examined.     COLON FINDINGS: A diminutive flat polyp was found.  Multiple biopsies were performed using cold forceps.   The colon was otherwise normal.  There was no diverticulosis, inflamation, polyps or cancers unless previously stated.     Retroflexed views revealed no abnormalities.     The scope was then withdrawn from the patient and the procedure completed.  COMPLICATIONS: There were no complications. ENDOSCOPIC IMPRESSION: 1.   Diminutive flat polyp was found; multiple biopsies were performed using cold forceps 2.   The colon was otherwise normal  RECOMMENDATIONS: 1.  Repeat colonoscopy in 5 years if polyp adenomatous; otherwise 10 years 2.  Upper endoscopy will be scheduled 3.  await pathology results   REPEAT EXAM:  cc:  _______________________________ eSignedMardella Layman, MD, [facility] 11/17/2011 9:00 AM

## 2011-11-18 ENCOUNTER — Telehealth: Payer: Self-pay | Admitting: *Deleted

## 2011-11-18 NOTE — Telephone Encounter (Signed)
  Follow up Call-  Call back number 11/17/2011  Post procedure Call Back phone  # [phone]  Permission to leave phone message Yes     Patient questions:  Do you have a fever, pain , or abdominal swelling? no Pain Score  0 *  Have you tolerated food without any problems? yes  Have you been able to return to your normal activities? yes  Do you have any questions about your discharge instructions: Diet   no Medications  no Follow up visit  no  Do you have questions or concerns about your Care? no  Actions: * If pain score is 4 or above: No action needed, pain <4.

## 2011-11-22 ENCOUNTER — Encounter: Payer: Self-pay | Admitting: Gastroenterology

## 2011-12-24 ENCOUNTER — Ambulatory Visit: Payer: Medicare Other | Admitting: Gastroenterology

## 2012-01-06 ENCOUNTER — Encounter: Payer: Self-pay | Admitting: Gastroenterology

## 2012-01-06 ENCOUNTER — Ambulatory Visit (INDEPENDENT_AMBULATORY_CARE_PROVIDER_SITE_OTHER): Payer: Medicare Other | Admitting: Gastroenterology

## 2012-01-06 VITALS — BP 128/80 | HR 86 | Ht 60.0 in | Wt 148.3 lb

## 2012-01-06 DIAGNOSIS — G894 Chronic pain syndrome: Secondary | ICD-10-CM

## 2012-01-06 DIAGNOSIS — R1012 Left upper quadrant pain: Secondary | ICD-10-CM

## 2012-01-06 DIAGNOSIS — K259 Gastric ulcer, unspecified as acute or chronic, without hemorrhage or perforation: Secondary | ICD-10-CM

## 2012-01-06 DIAGNOSIS — T490X5A Adverse effect of local antifungal, anti-infective and anti-inflammatory drugs, initial encounter: Secondary | ICD-10-CM

## 2012-01-06 DIAGNOSIS — M7918 Myalgia, other site: Secondary | ICD-10-CM

## 2012-01-06 DIAGNOSIS — IMO0001 Reserved for inherently not codable concepts without codable children: Secondary | ICD-10-CM

## 2012-01-06 MED ORDER — OMEPRAZOLE 20 MG PO CPDR: 20.0000 mg | DELAYED_RELEASE_CAPSULE | Freq: Every day | ORAL | Status: DC

## 2012-01-06 MED ORDER — PROMETHAZINE-PHENYLEPHRINE 6.25-5 MG/5ML PO SYRP: 5.0000 mL | ORAL_SOLUTION | Freq: Four times a day (QID) | ORAL | Status: DC | PRN

## 2012-01-06 NOTE — Progress Notes (Signed)
This is a 66 year old Caucasian female undergoing workup for periodic left upper quadrant pain. The patient been on chronic NSAIDs, and recent endoscopy showed a healed fundal ulcer with negative biopsies for H. pylori. She is on daily PPI therapy and his restart of all tear and 75 mg once a day for her arthritis problems in her knees. She continues with occasional left upper quadrant musculoskeletal pain without other GI relationships are issues.  Current Medications, Allergies, Past Medical History, Past Surgical History, Family History and Social History were reviewed in [employer] record.  Pertinent Review of Systems Negative   Physical Exam: Healthy patient in no distress. Blood pressure 120/80, pulse 86 and regular, weight 148 pounds with BMI of 28.96. Chest is clear and I cannot appreciate murmurs gallops or rubs. She appear to be in a regular rhythm. Abdominal exam shows some obesity but no organomegaly, or definite masses. She does have a floating rib and her left upper quadrant area which is tender to palpation. Bowel sounds are normal. Mental status is normal.    Assessment and Plan: Musculoskeletal left upper quadrant pain related to a floating rib palpable in that area. She had evidence on endoscopy of a previous NSAID ulcer that had healed on PPI therapy. I have urged her to continue daily PPI therapy as per her insurance company. She relates that Nexium is too expensive for her. She is up-to-date on her colonoscopy and endoscopy, and we will see her on a when necessary basis as needed. I do not think further GI evaluation is indicated at this time. No diagnosis found.

## 2012-01-06 NOTE — Patient Instructions (Addendum)
We have sent the following medications to your pharmacy for you to pick up at your convenience: Prilosec Tussionex  CC: Dr Leodis Sias

## 2012-05-30 ENCOUNTER — Other Ambulatory Visit: Payer: Self-pay | Admitting: *Deleted

## 2012-05-30 MED ORDER — TRIAMTERENE-HCTZ 37.5-25 MG PO CAPS: 1.0000 | ORAL_CAPSULE | Freq: Every day | ORAL | Status: DC

## 2012-06-28 ENCOUNTER — Other Ambulatory Visit: Payer: Self-pay

## 2012-06-28 DIAGNOSIS — Z1231 Encounter for screening mammogram for malignant neoplasm of breast: Secondary | ICD-10-CM

## 2012-07-07 ENCOUNTER — Other Ambulatory Visit: Payer: Self-pay | Admitting: Family Medicine

## 2012-07-10 ENCOUNTER — Ambulatory Visit: Payer: Self-pay | Admitting: Family Medicine

## 2012-07-10 NOTE — Telephone Encounter (Signed)
Called rx to [facility].

## 2012-07-10 NOTE — Telephone Encounter (Signed)
LAST OV 1/14. LAST LABS 7/13. HAS APPT TODAY

## 2012-07-10 NOTE — Telephone Encounter (Signed)
Okay to refill x2 

## 2012-08-03 ENCOUNTER — Ambulatory Visit
Admission: RE | Admit: 2012-08-03 | Discharge: 2012-08-03 | Disposition: A | Discharge status: Home / Self Care | Payer: Medicare Other | Source: Ambulatory Visit

## 2012-08-03 DIAGNOSIS — Z1231 Encounter for screening mammogram for malignant neoplasm of breast: Secondary | ICD-10-CM

## 2012-08-11 ENCOUNTER — Ambulatory Visit (INDEPENDENT_AMBULATORY_CARE_PROVIDER_SITE_OTHER): Payer: Medicare Other | Admitting: Family Medicine

## 2012-08-11 ENCOUNTER — Encounter: Payer: Self-pay | Admitting: Family Medicine

## 2012-08-11 VITALS — BP 136/92 | HR 87 | Temp 97.1°F | Wt 152.0 lb

## 2012-08-11 DIAGNOSIS — M21069 Valgus deformity, not elsewhere classified, unspecified knee: Secondary | ICD-10-CM | POA: Insufficient documentation

## 2012-08-11 DIAGNOSIS — E663 Overweight: Secondary | ICD-10-CM

## 2012-08-11 DIAGNOSIS — E785 Hyperlipidemia, unspecified: Secondary | ICD-10-CM

## 2012-08-11 DIAGNOSIS — K259 Gastric ulcer, unspecified as acute or chronic, without hemorrhage or perforation: Secondary | ICD-10-CM

## 2012-08-11 DIAGNOSIS — M170 Bilateral primary osteoarthritis of knee: Secondary | ICD-10-CM

## 2012-08-11 DIAGNOSIS — I10 Essential (primary) hypertension: Secondary | ICD-10-CM

## 2012-08-11 DIAGNOSIS — M1710 Unilateral primary osteoarthritis, unspecified knee: Secondary | ICD-10-CM

## 2012-08-11 LAB — COMPLETE METABOLIC PANEL WITH GFR
ALT: 48 U/L — ABNORMAL HIGH (ref 0–35)
AST: 34 U/L (ref 0–37)
Albumin: 4.9 g/dL (ref 3.5–5.2)
Alkaline Phosphatase: 69 U/L (ref 39–117)
BUN: 21 mg/dL (ref 6–23)
CO2: 29 mEq/L (ref 19–32)
Calcium: 9.7 mg/dL (ref 8.4–10.5)
Chloride: 98 mEq/L (ref 96–112)
Creat: 1 mg/dL (ref 0.50–1.10)
GFR, Est African American: 67 mL/min
GFR, Est Non African American: 58 mL/min — ABNORMAL LOW
Glucose, Bld: 132 mg/dL — ABNORMAL HIGH (ref 70–99)
Potassium: 3.8 mEq/L (ref 3.5–5.3)
Sodium: 138 mEq/L (ref 135–145)
Total Bilirubin: 0.8 mg/dL (ref 0.3–1.2)
Total Protein: 7.3 g/dL (ref 6.0–8.3)

## 2012-08-11 MED ORDER — CELECOXIB 200 MG PO CAPS: 200.0000 mg | ORAL_CAPSULE | Freq: Two times a day (BID) | ORAL | Status: DC

## 2012-08-11 NOTE — Progress Notes (Signed)
Patient ID: Misty Cervantes, female   DOB: 1945-08-17, 67 y.o.   MRN: [id number] SUBJECTIVE: HPI: Patient is here for follow up of hyperlipidemia/Hypertension/ :denies Headache;denies Chest Pain;denies weakness;denies Shortness of Breath and orthopnea;denies Visual changes;denies palpitations;denies cough;denies pedal edema;denies symptoms of TIA or stroke;deniesClaudication symptoms. admits to Compliance with medications; denies Problems with medications.  GERD:stable.  OA of the knees: has had 3 shots into the knees with orthopedics. The shots work. Has been doing fine.  Diet: loves cheese.   PMH/PSH: reviewed/updated in Epic  SH/FH: reviewed/updated in Epic  Allergies: reviewed/updated in Epic  Medications: reviewed/updated in Epic  Immunizations: reviewed/updated in Epic  ROS: As above in the HPI. All other systems are stable or negative.  OBJECTIVE: APPEARANCE:  Patient in no acute distress.The patient appeared well nourished and normally developed. Acyanotic. Waist: VITAL SIGNS:BP 136/92  Pulse 87  Temp(Src) 97.1 F (36.2 C) (Oral)  Wt 152 lb (68.947 kg)  BMI 29.69 kg/m2 WF overweight  SKIN: warm and  Dry without overt rashes, tattoos and scars  HEAD and Neck: without JVD, Head and scalp: normal Eyes:No scleral icterus. Fundi normal, eye movements normal. Ears: Auricle normal, canal normal, Tympanic membranes normal, insufflation normal. Nose: normal Throat: normal Neck & thyroid: normal  CHEST & LUNGS: Chest wall: normal Lungs: Clear  CVS: Reveals the PMI to be normally located. Regular rhythm, First and Second Heart sounds are normal,  absence of murmurs, rubs or gallops. Peripheral vasculature: Radial pulses: normal Dorsal pedis pulses: normal Posterior pulses: normal  ABDOMEN:  Appearance:obese Benign,, no organomegaly, no masses, no Abdominal Aortic enlargement. No Guarding , no rebound. No Bruits. Bowel sounds: normal  RECTAL: N/A GU:  N/A  EXTREMETIES: nonedematous. Both Femoral and Pedal pulses are normal.  MUSCULOSKELETAL:  Spine: normal Valgus deformity, with arthritic findings of the knees.  NEUROLOGIC: oriented to time,place and person; nonfocal.  ASSESSMENT: OVERWEIGHT  Essential hypertension, benign - Plan: COMPLETE METABOLIC PANEL WITH GFR  Other and unspecified hyperlipidemia - Plan: COMPLETE METABOLIC PANEL WITH GFR, NMR Lipoprofile with Lipids  Arthritis of both knees - Plan: celecoxib (CELEBREX) 200 MG capsule  Genu valgum, unspecified laterality - Plan: celecoxib (CELEBREX) 200 MG capsule  Gastric ulcer, unspecified ulcer chronicity  PLAN: D/C the voltaren because of nausea and risk of recurrence of gastric ulcer.  Orders Placed This Encounter  Procedures  . COMPLETE METABOLIC PANEL WITH GFR  . NMR Lipoprofile with Lipids   Meds ordered this encounter  Medications  . celecoxib (CELEBREX) 200 MG capsule    Sig: Take 1 capsule (200 mg total) by mouth 2 (two) times daily.    Dispense:  30 capsule    Refill:  2  samples #15 of  celebrex given      Dr Woodroe Mode Recommendations  Diet and Exercise discussed with patient.  For nutrition information, I recommend books:  1).Eat to Live by Dr Misty Cervantes. 2).Prevent and Reverse Heart Disease by Dr Misty Cervantes. 3) Dr Misty Cervantes Book: Reversing Diabetes  Exercise recommendations are:  If unable to walk, then the patient can exercise in a chair 3 times a day. By flapping arms like a bird gently and raising legs outwards to the front.  If ambulatory, the patient can go for walks for 30 minutes 3 times a week. Then increase the intensity and duration as tolerated.  Goal is to try to attain exercise frequency to 5 times a week.  If applicable: Best to perform resistance exercises (machines or weights) 2 days  a week and cardio type exercises 3 days per week.  Return in about 3 months (around 11/11/2012) for Recheck medical  problems.  Misty Cervantes P. Modesto Charon, M.D.

## 2012-08-11 NOTE — Patient Instructions (Addendum)
      Dr Tresa Jolley's Recommendations  Diet and Exercise discussed with patient.  For nutrition information, I recommend books:  1).Eat to Live by Dr Joel Fuhrman. 2).Prevent and Reverse Heart Disease by Dr Caldwell Esselstyn. 3) Dr Neal Barnard's Book: Reversing Diabetes  Exercise recommendations are:  If unable to walk, then the patient can exercise in a chair 3 times a day. By flapping arms like a bird gently and raising legs outwards to the front.  If ambulatory, the patient can go for walks for 30 minutes 3 times a week. Then increase the intensity and duration as tolerated.  Goal is to try to attain exercise frequency to 5 times a week.  If applicable: Best to perform resistance exercises (machines or weights) 2 days a week and cardio type exercises 3 days per week.  

## 2012-08-15 ENCOUNTER — Other Ambulatory Visit: Payer: Self-pay | Admitting: Family Medicine

## 2012-08-15 ENCOUNTER — Telehealth: Payer: Self-pay | Admitting: Family Medicine

## 2012-08-15 LAB — NMR LIPOPROFILE WITH LIPIDS
Cholesterol, Total: 203 mg/dL — ABNORMAL HIGH (ref <200)
HDL Particle Number: 33 umol/L (ref >=30.5)
HDL Size: 8.3 nm — ABNORMAL LOW (ref >=9.2)
HDL-C: 42 mg/dL (ref >=40)
LDL (calc): 126 mg/dL — ABNORMAL HIGH (ref <100)
LDL Particle Number: 2045 nmol/L — ABNORMAL HIGH (ref <1000)
LDL Size: 20.2 nm — ABNORMAL LOW (ref >20.5)
LP-IR Score: 79 — ABNORMAL HIGH (ref <=45)
Large HDL-P: 1.4 umol/L — ABNORMAL LOW (ref >=4.8)
Large VLDL-P: 4.4 nmol/L — ABNORMAL HIGH (ref <=2.7)
Small LDL Particle Number: 1279 nmol/L — ABNORMAL HIGH (ref <=527)
Triglycerides: 173 mg/dL — ABNORMAL HIGH (ref <150)
VLDL Size: 50.5 nm — ABNORMAL HIGH (ref <=46.6)

## 2012-08-15 NOTE — Progress Notes (Signed)
Quick Note:  Labs abnormal. Needs a HGBA1C because of the elevated sugar and need to double the pravastatin because the lipids are high. Please call in the higher dose of the pravastatin 40 mg #30 refill 3 ______

## 2012-08-16 LAB — POCT GLYCOSYLATED HEMOGLOBIN (HGB A1C): Hemoglobin A1C: 6.2

## 2012-08-16 NOTE — Addendum Note (Signed)
Addended by: Orma Render F on: 08/16/2012 11:10 AM   Modules accepted: Orders

## 2012-08-16 NOTE — Telephone Encounter (Signed)
Misty Cervantes called in this am

## 2012-08-18 ENCOUNTER — Encounter: Payer: Self-pay | Admitting: Obstetrics & Gynecology

## 2012-08-18 ENCOUNTER — Ambulatory Visit (INDEPENDENT_AMBULATORY_CARE_PROVIDER_SITE_OTHER): Payer: Medicare Other | Admitting: Obstetrics & Gynecology

## 2012-08-18 VITALS — BP 134/75 | HR 81 | Temp 97.0°F | Ht 59.75 in | Wt 156.0 lb

## 2012-08-18 DIAGNOSIS — Z4689 Encounter for fitting and adjustment of other specified devices: Secondary | ICD-10-CM

## 2012-08-18 NOTE — Progress Notes (Signed)
Subjective:     Patient ID: Misty Cervantes, female   DOB: 1945-05-01, 67 y.o.   MRN: [id number]  HPI Pt with h/o cystocele.  She has a pessary that she is comfortable removing but, noted a slight reddish brown color and was worried.  She denies pain or frank bleeding. She has assumed custody of her 2 grandchildren and want to make sure she's healthy.  Feels her bladder more with voiding but, does not feel like it is a problem.  Mammogram:  May 2014 normal   Review of Systems     Objective:   Physical Exam BP 134/75  Pulse 81  Temp(Src) 97 F (36.1 C)  Ht 4' 11.75" (1.518 m)  Wt 156 lb (70.761 kg)  BMI 30.71 kg/m2 Pt in NAD GU: EGBUS: no lesions Vagina: no blood in vault; no abnormal discharge noted Tissue well healed; slightly atrophic; grade III cystocele Pessary replaced and held the prolapse in place            Assessment:     Grade III cystocele- discussed with pt additional treatment options including referral to Urogyn for repeat procedure or topical EES for atrophic vaginitis.   Pt reports that she would like to stay the course for now.  She feel better knowing that there is no acute problem       Plan:     F/u in 4 months or sooner prn

## 2012-08-18 NOTE — Patient Instructions (Addendum)
Prolapse  Prolapse means the falling down, bulging, dropping, or drooping of a body part. Organs that commonly prolapse include the rectum, small intestine, bladder, urethra, vagina (birth canal), uterus (womb), and cervix. Prolapse occurs when the ligaments and muscle tissue around the rectum, bladder, and uterus are damaged or weakened.  CAUSES  This happens especially with:  Childbirth. Some women feel pelvic pressure or have trouble holding their urine right after childbirth, because of stretching and tearing of pelvic tissues. This generally gets better with time and the feeling usually goes away, but it may return with aging.  Chronic heavy lifting.  Aging.  Menopause, with loss of estrogen production weakening the pelvic ligaments and muscles.  Past pelvic surgery.  Obesity.  Chronic constipation.  Chronic cough. Prolapse may affect a single organ, or several organs may prolapse at the same time. The front wall of the vagina holds up the bladder. The back wall holds up part of the lower intestine, or rectum. The uterus fills a spot in the middle. All these organs can be involved when the ligaments and muscles around the vagina relax too much. This often gets worse when women stop producing estrogen (menopause). SYMPTOMS  Uncontrolled loss of urine (incontinence) with cough, sneeze, straining, and exercise.  More force may be required to have a bowel movement, due to trapping of the stool.  When part of an organ bulges through the opening of the vagina, there is sometimes a feeling of heaviness or pressure. It may feel as though something is falling out. This sensation increases with coughing or bearing down.  If the organs protrude through the opening of the vagina and rub against the clothing, there may be soreness, ulcers, infection, pain, and bleeding.  Lower back pain.  Pushing in the upper or lower part of the vagina, to pass urine or have a bowel movement.  Problems  having sexual intercourse.  Being unable to insert a tampon or applicator. DIAGNOSIS  Usually, a physical exam is all that is needed to identify the problem. During the examination, you may be asked to cough and strain while lying down, sitting up, and standing up. Your caregiver will determine if more testing is required, such as bladder function tests. Some diagnoses are:  Cystocele: Bulging and falling of the bladder into the top of the vagina.  Rectocele: Part of the rectum bulging into the vagina.  Prolapse of the uterus: The uterus falls or drops into the vagina.  Enterocele: Bulging of the top of the vagina, after a hysterectomy (uterus removal), with the small intestine bulging into the vagina. A hernia in the top of the vagina.  Urethrocele: The urethra (urine carrying tube) bulging into the vagina. TREATMENT  In most cases, prolapse needs to be treated only if it produces symptoms. If the symptoms are interfering with your usual daily or sexual activities, treatment may be necessary. The following are some measures that may be used to treat prolapse.  Estrogen may help elderly women with mild prolapse.  Kegel exercises may help mild cases of prolapse, by strengthening and tightening the muscles of the pelvic floor.  Pessaries are used in women who choose not to, or are unable to, have surgery. A pessary is a doughnut-shaped piece of plastic or rubber that is put into the vagina to keep the organs in place. This device must be fitted by your caregiver. Your caregiver will also explain how to care for yourself with the pessary. If it works well for you,   this may be the only treatment required.  Surgery is often the only form of treatment for more severe prolapses. There are different types of surgery available. You should discuss what the best procedure is for you. If the uterus is prolapsed, it may be removed (hysterectomy) as part of the surgical treatment. Your caregiver will  discuss the risks and benefits with you.  Uterine-vaginal suspension (surgery to hold up the organs) may be used, especially if you want to maintain your fertility. No form of treatment is guaranteed to correct the prolapse or relieve the symptoms. HOME CARE INSTRUCTIONS   Wear a sanitary pad or absorbent product if you have incontinence of urine.  Avoid heavy lifting and straining with exercise and work.  Take over-the-counter pain medicine for minor discomfort.  Try taking estrogen or using estrogen vaginal cream.  Try Kegel exercises or use a pessary, before deciding to have surgery.  Do Kegel exercises after having a baby. SEEK MEDICAL CARE IF:   Your symptoms interfere with your daily activities.  You need medicine to help with the discomfort.  You need to be fitted with a pessary.  You notice bleeding from the vagina.  You think you have ulcers or you notice ulcers on the cervix.  You have an oral temperature above 102 F (38.9 C).  You develop pain or blood with urination.  You have bleeding with a bowel movement.  The symptoms are interfering with your sex life.  You have urinary incontinence that interferes with your daily activities.  You lose urine with sexual intercourse.  You have a chronic cough.  You have chronic constipation. Document Released: 09/05/2002 Document Revised: 05/24/2011 Document Reviewed: 03/16/2009 [employer] Patient Information 2014 [employer].  

## 2012-08-18 NOTE — Progress Notes (Signed)
Here history of hysterectomy with bladder issues, has pessary and states last few weeks is causing her some discomfort and vaginal discharge.

## 2012-11-02 ENCOUNTER — Other Ambulatory Visit: Payer: Self-pay | Admitting: Family Medicine

## 2012-11-14 ENCOUNTER — Ambulatory Visit: Payer: Medicare Other | Admitting: Family Medicine

## 2012-12-14 ENCOUNTER — Ambulatory Visit (INDEPENDENT_AMBULATORY_CARE_PROVIDER_SITE_OTHER): Payer: Medicare Other | Admitting: Family Medicine

## 2012-12-14 ENCOUNTER — Encounter: Payer: Self-pay | Admitting: Family Medicine

## 2012-12-14 VITALS — BP 128/81 | HR 96 | Temp 98.0°F | Wt 159.0 lb

## 2012-12-14 DIAGNOSIS — E663 Overweight: Secondary | ICD-10-CM

## 2012-12-14 DIAGNOSIS — R635 Abnormal weight gain: Secondary | ICD-10-CM

## 2012-12-14 DIAGNOSIS — M170 Bilateral primary osteoarthritis of knee: Secondary | ICD-10-CM

## 2012-12-14 DIAGNOSIS — M21069 Valgus deformity, not elsewhere classified, unspecified knee: Secondary | ICD-10-CM

## 2012-12-14 DIAGNOSIS — R7309 Other abnormal glucose: Secondary | ICD-10-CM

## 2012-12-14 DIAGNOSIS — M1710 Unilateral primary osteoarthritis, unspecified knee: Secondary | ICD-10-CM

## 2012-12-14 DIAGNOSIS — E785 Hyperlipidemia, unspecified: Secondary | ICD-10-CM

## 2012-12-14 DIAGNOSIS — R739 Hyperglycemia, unspecified: Secondary | ICD-10-CM

## 2012-12-14 DIAGNOSIS — I10 Essential (primary) hypertension: Secondary | ICD-10-CM

## 2012-12-14 LAB — POCT GLYCOSYLATED HEMOGLOBIN (HGB A1C): Hemoglobin A1C: 6.3

## 2012-12-14 MED ORDER — PRAVASTATIN SODIUM 40 MG PO TABS: 40.0000 mg | ORAL_TABLET | Freq: Every day | ORAL | Status: DC

## 2012-12-14 NOTE — Progress Notes (Signed)
Patient ID: Misty Cervantes, female   DOB: Apr 13, 1945, 67 y.o.   MRN: [id number] SUBJECTIVE: CC: Chief Complaint  Patient presents with  . Follow-up  . Medication Refill    refill paravastatain dyazide,    HPI: Has been fine. The knees still bothers her, starting a new round of the injections onto the knees. Saw dermatologist and this was good. 10 lesions were frozen off.  Patient is here for follow up of hyperlipidemia/Hypertension.: denies Headache;denies Chest Pain;denies weakness;denies Shortness of Breath and orthopnea;denies Visual changes;denies palpitations;denies cough;denies pedal edema;denies symptoms of TIA or stroke;deniesClaudication symptoms. admits to Compliance with medications; denies Problems with medications.  Diet: could be better Past Medical History  Diagnosis Date  . Cataract   . HTN (hypertension)   . Osteoarthritis   . Hyperlipemia   . Anemia   . Gastric ulcer 11-17-11    egd  . Hyperplastic polyp of intestine 11-17-11    colonoscopy   Past Surgical History  Procedure Laterality Date  . Total vaginal hysterectomy  2012   . Cataract extraction    . Retinal detachment surgery    . Hip arthroplasty  2011    Left   . Colonoscopy  2013  . Upper gi endoscopy  2013   History   Social History  . Marital Status: Divorced    Spouse Name: N/A    Number of Children: N/A  . Years of Education: N/A   Occupational History  . retired    Social History Main Topics  . Smoking status: Never Smoker   . Smokeless tobacco: Never Used  . Alcohol Use: No  . Drug Use: No  . Sexual Activity: No   Other Topics Concern  . Not on file   Social History Narrative   Divorced, 2 children; retired.    Family History  Problem Relation Age of Onset  . Diabetes Mother     deceased  . Diabetes Sister     x2  . Kidney cancer Father    Current Outpatient Prescriptions on File Prior to Visit  Medication Sig Dispense Refill  . aspirin 81 MG chewable tablet Chew  81 mg by mouth daily.      . celecoxib (CELEBREX) 200 MG capsule Take 1 capsule (200 mg total) by mouth 2 (two) times daily.  30 capsule  2  . DM-Phenylephrine-Acetaminophen (ALKA-SELTZER PLS SINUS & COUGH) 10-5-325 MG CAPS Take 2 capsules by mouth as needed.      Marland Kitchen HYDROcodone-acetaminophen (VICODIN) 5-500 MG per tablet Take 1 tablet by mouth every 6 (six) hours as needed.        . Multiple Vitamin (MULTIVITAMIN) tablet Take 1 tablet by mouth daily.        Marland Kitchen omeprazole (PRILOSEC) 20 MG capsule Take 1 capsule (20 mg total) by mouth daily.  90 capsule  3  . Potassium 95 MG TBCR Take 1 tablet by mouth daily.        . pravastatin (PRAVACHOL) 40 MG tablet Take 40 mg by mouth daily.      Marland Kitchen triamterene-hydrochlorothiazide (DYAZIDE) 37.5-25 MG per capsule Take one capsule by mouth one time daily  30 capsule  2   No current facility-administered medications on file prior to visit.   Allergies  Allergen Reactions  . Probiotic [Acidophilus]     Pt not sure of which probiotic it was    There is no immunization history on file for this patient. Prior to Admission medications   Medication Sig Start Date End  Date Taking? Authorizing Provider  aspirin 81 MG chewable tablet Chew 81 mg by mouth daily.   Yes Historical Provider, MD  celecoxib (CELEBREX) 200 MG capsule Take 1 capsule (200 mg total) by mouth 2 (two) times daily. 08/11/12  Yes Ileana Ladd, MD  DM-Phenylephrine-Acetaminophen (ALKA-SELTZER PLS SINUS & COUGH) 10-5-325 MG CAPS Take 2 capsules by mouth as needed.   Yes Historical Provider, MD  HYDROcodone-acetaminophen (VICODIN) 5-500 MG per tablet Take 1 tablet by mouth every 6 (six) hours as needed.     Yes Historical Provider, MD  Multiple Vitamin (MULTIVITAMIN) tablet Take 1 tablet by mouth daily.     Yes Historical Provider, MD  omeprazole (PRILOSEC) 20 MG capsule Take 1 capsule (20 mg total) by mouth daily. 01/06/12  Yes Mardella Layman, MD  Potassium 95 MG TBCR Take 1 tablet by mouth  daily.     Yes Historical Provider, MD  pravastatin (PRAVACHOL) 40 MG tablet Take 40 mg by mouth daily.   Yes Historical Provider, MD  triamterene-hydrochlorothiazide (DYAZIDE) 37.5-25 MG per capsule Take one capsule by mouth one time daily 11/02/12  Yes Ernestina Penna, MD     ROS: As above in the HPI. All other systems are stable or negative.  OBJECTIVE: APPEARANCE:  Patient in no acute distress.The patient appeared well nourished and normally developed. Acyanotic. Waist: VITAL SIGNS:BP 128/81  Pulse 96  Temp(Src) 98 F (36.7 C) (Oral)  Wt 159 lb (72.122 kg)  BMI 31.3 kg/m2 WF Obese  SKIN: warm and  Dry without overt rashes, tattoos and scars  HEAD and Neck: without JVD, Head and scalp: normal Eyes:No scleral icterus. Fundi normal, eye movements normal. Ears: Auricle normal, canal normal, Tympanic membranes normal, insufflation normal. Nose: normal Throat: normal Neck & thyroid: normal  CHEST & LUNGS: Chest wall: normal Lungs: Clear  CVS: Reveals the PMI to be normally located. Regular rhythm, First and Second Heart sounds are normal,  absence of murmurs, rubs or gallops. Peripheral vasculature: Radial pulses: normal Dorsal pedis pulses: normal Posterior pulses: normal  ABDOMEN:  Appearance: Obese Benign, no organomegaly, no masses, no Abdominal Aortic enlargement. No Guarding , no rebound. No Bruits. Bowel sounds: normal  RECTAL: N/A GU: N/A  EXTREMETIES: nonedematous.  MUSCULOSKELETAL:  Spine: normal Joints: Genu Valgum, bilaterally  NEUROLOGIC: oriented to time,place and person; nonfocal.  ASSESSMENT: Other and unspecified hyperlipidemia - Plan: CMP14+EGFR, NMR, lipoprofile, pravastatin (PRAVACHOL) 40 MG tablet  Essential hypertension, benign - Plan: CMP14+EGFR  Genu valgum, unspecified laterality  OVERWEIGHT  Arthritis of both knees  Hyperglycemia - Plan: POCT glycosylated hemoglobin (Hb A1C)   PLAN: Orders Placed This Encounter   Procedures  . CMP14+EGFR  . NMR, lipoprofile  . POCT glycosylated hemoglobin (Hb A1C)    Meds ordered this encounter  Medications  . pravastatin (PRAVACHOL) 40 MG tablet    Sig: Take 1 tablet (40 mg total) by mouth daily.    Dispense:  30 tablet    Refill:  11    Diet, exercise weight loss.  Continue with present medication regimen.  Return in about 4 months (around 04/16/2013) for Recheck medical problems.  Kerry Odonohue P. [place]esto Charon, M.D.

## 2012-12-16 LAB — CMP14+EGFR
ALT: 59 IU/L — ABNORMAL HIGH (ref 0–32)
AST: 37 IU/L (ref 0–40)
Albumin/Globulin Ratio: 2.1 (ref 1.1–2.5)
Albumin: 4.6 g/dL (ref 3.6–4.8)
Alkaline Phosphatase: 76 IU/L (ref 39–117)
BUN/Creatinine Ratio: 18 (ref 11–26)
BUN: 20 mg/dL (ref 8–27)
CO2: 28 mmol/L (ref 18–29)
Calcium: 9.7 mg/dL (ref 8.6–10.2)
Chloride: 98 mmol/L (ref 97–108)
Creatinine, Ser: 1.11 mg/dL — ABNORMAL HIGH (ref 0.57–1.00)
GFR calc Af Amer: 59 mL/min/{1.73_m2} — ABNORMAL LOW (ref >59)
GFR calc non Af Amer: 52 mL/min/{1.73_m2} — ABNORMAL LOW (ref >59)
Globulin, Total: 2.2 g/dL (ref 1.5–4.5)
Glucose: 122 mg/dL — ABNORMAL HIGH (ref 65–99)
Potassium: 4 mmol/L (ref 3.5–5.2)
Sodium: 141 mmol/L (ref 134–144)
Total Bilirubin: 0.7 mg/dL (ref 0.0–1.2)
Total Protein: 6.8 g/dL (ref 6.0–8.5)

## 2012-12-16 LAB — NMR, LIPOPROFILE
Cholesterol: 216 mg/dL — ABNORMAL HIGH (ref <200)
HDL Cholesterol by NMR: 46 mg/dL (ref >=40)
HDL Particle Number: 35.6 umol/L (ref >=30.5)
LDL Particle Number: 2031 nmol/L — ABNORMAL HIGH (ref <1000)
LDL Size: 20.3 nm — ABNORMAL LOW (ref >20.5)
LDLC SERPL CALC-MCNC: 130 mg/dL — ABNORMAL HIGH (ref <100)
LP-IR Score: 75 — ABNORMAL HIGH (ref <=45)
Small LDL Particle Number: 1218 nmol/L — ABNORMAL HIGH (ref <=527)
Triglycerides by NMR: 200 mg/dL — ABNORMAL HIGH (ref <150)

## 2013-01-09 ENCOUNTER — Other Ambulatory Visit: Payer: Self-pay | Admitting: Gastroenterology

## 2013-02-06 ENCOUNTER — Other Ambulatory Visit: Payer: Self-pay | Admitting: Family Medicine

## 2013-02-19 ENCOUNTER — Ambulatory Visit (INDEPENDENT_AMBULATORY_CARE_PROVIDER_SITE_OTHER): Payer: Medicare Other | Admitting: Pharmacist

## 2013-02-19 VITALS — BP 128/70 | HR 74 | Ht 59.25 in | Wt 161.0 lb

## 2013-02-19 DIAGNOSIS — R7303 Prediabetes: Secondary | ICD-10-CM | POA: Insufficient documentation

## 2013-02-19 DIAGNOSIS — E785 Hyperlipidemia, unspecified: Secondary | ICD-10-CM

## 2013-02-19 DIAGNOSIS — R7309 Other abnormal glucose: Secondary | ICD-10-CM

## 2013-02-19 DIAGNOSIS — R739 Hyperglycemia, unspecified: Secondary | ICD-10-CM

## 2013-02-19 MED ORDER — ATORVASTATIN CALCIUM 40 MG PO TABS: 40.0000 mg | ORAL_TABLET | Freq: Every day | ORAL | Status: DC

## 2013-02-19 NOTE — Progress Notes (Signed)
Lipid Clinic Consultation  Chief Complaint:   Chief Complaint  Patient presents with  . Hyperlipidemia     HPI:  This is patient's first visit to lipid clinic.  She has been taking pravasatin 40mg  daily but this medication has not gotten her lipids to goal.  She has taken other statins in the past but she experienced nausea which she actually thinks may have been related to an ulcer.   Patient states she is the legal guardian of her 68yo and 11yo grandchildren and she wishes to be around for them for a long time.   She has a family significant for CAD / MI and type 2 DM.      Component Value Date/Time   CHOL 216 12/14/2012 0927   LDL-P 2031 12/14/2012   LDL-C 130 12/14/2012   TRIG 200 12/14/2012    CHD/CHF Risk Equivalents:  CAD and elevated BG / Pre diabetes NCEP Risk Factors Present:  HTN, family history and age Primary Problem(s):  LDL or LDL-P elevated and TG elevated  Current NCEP Goals: LDL Goal < 100 HDL Goal >/= 45 Tg Goal < 409 Non-HDL Goal < 130  Secondary cause of hyperlipidemia present:  Elevated Tg and high CHO intake Low fat diet followed?  No  Low carb diet followed?  No  Exercise?  No   Filed Vitals:   02/19/13 1030  BP: 128/70  Pulse: 74   Body mass index is 32.24 kg/(m^2). Filed Weights   02/19/13 1030  Weight: 161 lb (73.029 kg)      Assessment: Dyslipidemia Pre Diabetes / Metabolic Syndrome  Plan: D/c pravastatin and change to atorvastatin 40mg  daily Reviewed limiting fat and CHO to help with both LDL and Tg. Recommended start exercise program - patient to look into Silver [employer]neakers program or aquatics program. Goal is to work up to 30 minutes daily as able.  Recheck Lipid Panel:  4-6 weeks Other labs needed:  LFTs and A1c  Time spent counseling patient:  30 minutes    Henrene Pastor, PharmD, CPP

## 2013-02-26 ENCOUNTER — Telehealth: Payer: Self-pay | Admitting: Pharmacist

## 2013-02-26 NOTE — Telephone Encounter (Signed)
Called patient to let her know we have flu vaccine available now.   She has a cold and will call back when feeling better to schedule appt for vaccine.

## 2013-03-05 ENCOUNTER — Telehealth: Payer: Self-pay | Admitting: Family Medicine

## 2013-03-05 NOTE — Telephone Encounter (Signed)
Spoke with pt regarding appt She is currently taking an antibiotic for pneumonia She states she is feeling better Denies fever or SOB Will come in for follow up after Christmas or sooner if worsens

## 2013-03-12 ENCOUNTER — Ambulatory Visit (INDEPENDENT_AMBULATORY_CARE_PROVIDER_SITE_OTHER): Payer: Medicare Other | Admitting: Family Medicine

## 2013-03-12 ENCOUNTER — Ambulatory Visit (INDEPENDENT_AMBULATORY_CARE_PROVIDER_SITE_OTHER): Payer: Medicare Other

## 2013-03-12 ENCOUNTER — Encounter: Payer: Self-pay | Admitting: Family Medicine

## 2013-03-12 ENCOUNTER — Telehealth: Payer: Self-pay | Admitting: Family Medicine

## 2013-03-12 VITALS — BP 153/99 | HR 87 | Temp 97.8°F | Ht 59.0 in | Wt 160.0 lb

## 2013-03-12 DIAGNOSIS — R05 Cough: Secondary | ICD-10-CM

## 2013-03-12 DIAGNOSIS — J209 Acute bronchitis, unspecified: Secondary | ICD-10-CM

## 2013-03-12 MED ORDER — BENZONATATE 100 MG PO CAPS: 100.0000 mg | ORAL_CAPSULE | Freq: Three times a day (TID) | ORAL | Status: DC | PRN

## 2013-03-12 MED ORDER — AZITHROMYCIN 250 MG PO TABS: ORAL_TABLET | ORAL | Status: DC

## 2013-03-12 NOTE — Telephone Encounter (Signed)
appt today with Misty Cervantes

## 2013-03-12 NOTE — Progress Notes (Signed)
   Subjective:    Patient ID: Misty Cervantes, female    DOB: May 12, 1945, 67 y.o.   MRN: [id number]  HPI This 67 y.o. female presents for evaluation of follow up on pneumonia.  She was seen At an Urgent Care and she was tx'd with doxycycline and tessalon perles.  She was seen 11days ago and she took 10 days of doxycycline and she is still coughing and having Fever.   Review of Systems C/o fever, sob, and cough No chest pain, SOB, HA, dizziness, vision change, N/V, diarrhea, constipation, dysuria, urinary urgency or frequency, myalgias, arthralgias or rash.     Objective:   Physical Exam Vital signs noted  Well developed well nourished female.  HEENT - Head atraumatic Normocephalic                Eyes - PERRLA, Conjuctiva - clear Sclera- Clear EOMI                Ears - EAC's Wnl TM's Wnl Gross Hearing                 Throat - oropharanx wnl Respiratory - Lungs CTA bilateral Cardiac - RRR S1 and S2 without murmur GI - Abdomen soft Nontender and bowel sounds active x 4 Extremities - No edema. Neuro - Grossly intact.   cxr - no infiltrates Prelimnary reading by Angeline Slim    Assessment & Plan:  Cough - Plan: DG Chest 2 View, azithromycin (ZITHROMAX) 250 MG tablet, benzonatate (TESSALON PERLES) 100 MG capsule  Acute bronchitis - Plan: azithromycin (ZITHROMAX) 250 MG tablet, benzonatate (TESSALON PERLES) 100 MG capsule Push po fluids, rest, tylenol and motrin otc prn as directed for fever, arthralgias, and myalgias.  Follow up prn if sx's continue or persist.  Deatra Canter FNP

## 2013-03-12 NOTE — Patient Instructions (Signed)

## 2013-04-10 ENCOUNTER — Other Ambulatory Visit: Payer: Self-pay | Admitting: Pharmacist

## 2013-04-10 ENCOUNTER — Ambulatory Visit: Payer: Medicare Other

## 2013-04-10 DIAGNOSIS — Z13820 Encounter for screening for osteoporosis: Secondary | ICD-10-CM

## 2013-04-17 ENCOUNTER — Ambulatory Visit: Payer: Medicare Other | Admitting: Family Medicine

## 2013-05-02 ENCOUNTER — Other Ambulatory Visit: Payer: Medicare Other

## 2013-05-02 ENCOUNTER — Ambulatory Visit: Payer: Medicare Other

## 2013-05-04 ENCOUNTER — Ambulatory Visit: Payer: Medicare Other | Admitting: Family Medicine

## 2013-05-24 ENCOUNTER — Ambulatory Visit: Payer: Medicare Other | Admitting: Family Medicine

## 2013-05-31 ENCOUNTER — Ambulatory Visit: Payer: Medicare Other | Admitting: Family Medicine

## 2013-06-05 ENCOUNTER — Ambulatory Visit: Payer: Medicare Other | Admitting: Family Medicine

## 2013-06-13 ENCOUNTER — Other Ambulatory Visit: Payer: Self-pay | Admitting: Family Medicine

## 2013-07-06 ENCOUNTER — Ambulatory Visit: Payer: Medicare Other | Admitting: Family Medicine

## 2013-07-31 ENCOUNTER — Other Ambulatory Visit: Payer: Self-pay

## 2013-07-31 DIAGNOSIS — Z1231 Encounter for screening mammogram for malignant neoplasm of breast: Secondary | ICD-10-CM

## 2013-08-16 ENCOUNTER — Ambulatory Visit
Admission: RE | Admit: 2013-08-16 | Discharge: 2013-08-16 | Disposition: A | Discharge status: Home / Self Care | Payer: Medicare Other | Source: Ambulatory Visit

## 2013-08-16 ENCOUNTER — Encounter (INDEPENDENT_AMBULATORY_CARE_PROVIDER_SITE_OTHER): Payer: Self-pay

## 2013-08-16 DIAGNOSIS — Z1231 Encounter for screening mammogram for malignant neoplasm of breast: Secondary | ICD-10-CM

## 2013-09-24 ENCOUNTER — Telehealth: Payer: Self-pay | Admitting: Family Medicine

## 2013-09-24 MED ORDER — TRIAMTERENE-HCTZ 37.5-25 MG PO CAPS: ORAL_CAPSULE | ORAL | Status: DC

## 2013-09-24 NOTE — Telephone Encounter (Signed)
done

## 2013-09-25 ENCOUNTER — Other Ambulatory Visit: Payer: Self-pay | Admitting: *Deleted

## 2013-09-25 ENCOUNTER — Telehealth: Payer: Self-pay | Admitting: *Deleted

## 2013-09-25 MED ORDER — TRIAMTERENE-HCTZ 37.5-25 MG PO CAPS
ORAL_CAPSULE | ORAL | Status: DC
Start: 2013-09-25 — End: 2013-10-30

## 2013-09-25 MED ORDER — TRIAMTERENE-HCTZ 37.5-25 MG PO CAPS: ORAL_CAPSULE | ORAL | Status: DC

## 2013-09-25 NOTE — Telephone Encounter (Signed)
done

## 2013-10-29 ENCOUNTER — Encounter: Payer: Self-pay | Admitting: Family Medicine

## 2013-10-29 ENCOUNTER — Ambulatory Visit (INDEPENDENT_AMBULATORY_CARE_PROVIDER_SITE_OTHER): Payer: Medicare Other | Admitting: Family Medicine

## 2013-10-29 ENCOUNTER — Other Ambulatory Visit: Payer: Self-pay | Admitting: Family Medicine

## 2013-10-29 VITALS — BP 143/84 | HR 80 | Temp 97.9°F | Ht 59.0 in | Wt 158.2 lb

## 2013-10-29 DIAGNOSIS — E785 Hyperlipidemia, unspecified: Secondary | ICD-10-CM

## 2013-10-29 DIAGNOSIS — I10 Essential (primary) hypertension: Secondary | ICD-10-CM

## 2013-10-29 MED ORDER — PRAVASTATIN SODIUM 40 MG PO TABS: 40.0000 mg | ORAL_TABLET | Freq: Every day | ORAL | Status: DC

## 2013-10-29 MED ORDER — FUROSEMIDE 20 MG PO TABS: 20.0000 mg | ORAL_TABLET | Freq: Every day | ORAL | Status: DC

## 2013-10-29 NOTE — Progress Notes (Addendum)
   Subjective:    Patient ID: Misty Cervantes, female    DOB: 04/06/1945, 68 y.o.   MRN: [id number]  HPI 68 year old female who is here to followup hypertension and hyperlipidemia. She is status post left hip replacement but still has issues with her niece and will probably result in the replacement in the future. She has guardianship of her oldest son's 2 children, a boy and girl. They are with her today. 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 She does complain of some intermittent dependent edema and is requesting furosemide to take on an as-needed basis; I have no problem with this. She was given atorvastatin but it seemed to disagree with her stomach and she has gone back to the pravastatin.    Review of Systems  Constitutional: Negative.   HENT: Negative.   Eyes: Negative.   Respiratory: Negative.   Cardiovascular: Negative.   Gastrointestinal: Negative.   Endocrine: Negative.   Genitourinary: Negative.   Musculoskeletal: Positive for myalgias.       Bilateral thumb pain  Skin: Rash: not true rash but healing intertrigo.  Hematological: Negative.   Psychiatric/Behavioral: Negative.        Objective:   Physical Exam  Constitutional: She is oriented to person, place, and time. She appears well-developed and well-nourished.  Eyes: Conjunctivae and EOM are normal.  Neck: Normal range of motion. Neck supple.  Cardiovascular: Normal rate, regular rhythm and normal heart sounds.   Pulmonary/Chest: Effort normal and breath sounds normal.  Abdominal: Soft. Bowel sounds are normal.  Musculoskeletal: Normal range of motion.  R knee with valgus deformity and crepitance on exam  Neurological: She is alert and oriented to person, place, and time. She has normal reflexes.  Skin: Skin is warm and dry.  Psychiatric: She has a normal mood and affect. Her behavior is normal. Thought content normal.   BP 143/84  Pulse 80  Temp(Src) 97.9 F  (36.6 C) (Oral)  Ht _0  (1.499 m)  Wt 158 lb 3.2 oz (71.759 kg)  BMI 31.94 kg/m2       Assessment & Plan:  1. Essential hypertension, benign Continue with triamterterene - CMP14+EGFR  2. Other and unspecified hyperlipidemia Continue pravastatin - Lipid panel  Wardell Honour MD

## 2013-10-29 NOTE — Patient Instructions (Signed)

## 2013-10-30 ENCOUNTER — Telehealth: Payer: Self-pay | Admitting: Family Medicine

## 2013-10-30 LAB — LIPID PANEL
CHOLESTEROL TOTAL: 230 mg/dL — AB (ref 100–199)
Chol/HDL Ratio: 5.8 ratio units — ABNORMAL HIGH (ref 0.0–4.4)
HDL: 40 mg/dL (ref >39)
LDL Calculated: 148 mg/dL — ABNORMAL HIGH (ref 0–99)
TRIGLYCERIDES: 212 mg/dL — AB (ref 0–149)
VLDL Cholesterol Cal: 42 mg/dL — ABNORMAL HIGH (ref 5–40)

## 2013-10-30 LAB — CMP14+EGFR
ALT: 59 IU/L — AB (ref 0–32)
AST: 40 IU/L (ref 0–40)
Albumin/Globulin Ratio: 2 (ref 1.1–2.5)
Albumin: 4.2 g/dL (ref 3.6–4.8)
Alkaline Phosphatase: 74 IU/L (ref 39–117)
BUN/Creatinine Ratio: 14 (ref 11–26)
BUN: 13 mg/dL (ref 8–27)
CALCIUM: 9.2 mg/dL (ref 8.7–10.3)
CO2: 26 mmol/L (ref 18–29)
Chloride: 103 mmol/L (ref 97–108)
Creatinine, Ser: 0.96 mg/dL (ref 0.57–1.00)
GFR calc Af Amer: 70 mL/min/{1.73_m2} (ref >59)
GFR calc non Af Amer: 61 mL/min/{1.73_m2} (ref >59)
GLUCOSE: 150 mg/dL — AB (ref 65–99)
Globulin, Total: 2.1 g/dL (ref 1.5–4.5)
POTASSIUM: 4.5 mmol/L (ref 3.5–5.2)
SODIUM: 141 mmol/L (ref 134–144)
Total Bilirubin: 0.6 mg/dL (ref 0.0–1.2)
Total Protein: 6.3 g/dL (ref 6.0–8.5)

## 2013-10-30 MED ORDER — TRIAMTERENE-HCTZ 37.5-25 MG PO CAPS: ORAL_CAPSULE | ORAL | Status: DC

## 2013-10-30 NOTE — Telephone Encounter (Signed)
done

## 2013-11-26 ENCOUNTER — Encounter: Payer: Self-pay | Admitting: *Deleted

## 2014-01-14 ENCOUNTER — Encounter: Payer: Self-pay | Admitting: Family Medicine

## 2014-03-04 ENCOUNTER — Ambulatory Visit (INDEPENDENT_AMBULATORY_CARE_PROVIDER_SITE_OTHER): Payer: Medicare Other | Admitting: Obstetrics & Gynecology

## 2014-03-04 ENCOUNTER — Encounter: Payer: Self-pay | Admitting: Obstetrics & Gynecology

## 2014-03-04 VITALS — BP 122/77 | HR 80 | Temp 97.5°F | Wt 153.7 lb

## 2014-03-04 DIAGNOSIS — N819 Female genital prolapse, unspecified: Secondary | ICD-10-CM

## 2014-03-04 DIAGNOSIS — N952 Postmenopausal atrophic vaginitis: Secondary | ICD-10-CM

## 2014-03-04 MED ORDER — ESTROGENS, CONJUGATED 0.625 MG/GM VA CREA: 1.0000 | TOPICAL_CREAM | VAGINAL | Status: DC

## 2014-03-04 NOTE — Progress Notes (Signed)
Subjective:     Patient ID: Misty Cervantes, female   DOB: 03/07/1946, 68 y.o.   MRN: [id number]  HPI Pt noted bleeding when removing her pessary and for 2 days after the fact.  There are also very small clots that were passed.  She had a hyst years prev.  The pt reports that she feels more pressure but, she feels like the pessary is working well. She is worried tha tmaybe her 'ovary' is affected as her ovary was left in place after the hyst.    Review of Systems     Objective:   Physical Exam BP 122/77 mmHg  Pulse 80  Temp(Src) 97.5 F (36.4 C)  Wt 153 lb 11.2 oz (69.718 kg) Pt in NAD GU: EGBUS: no lesions Vagina: very small amount of blood in vault; lots of redundant tissue in vagina.  The cuff is prolapsing to the introitus. Tissue is atrophic Adnexa: no masses; non tender      Assessment:     Pelvic prolapse.  Atrophic vaginitis      Plan:     EES cream when putting on the pessary F/u in 3 months or sooner prn

## 2014-03-04 NOTE — Progress Notes (Signed)
Pt states she is bleeding after taking out her device.

## 2014-03-04 NOTE — Patient Instructions (Signed)

## 2014-03-06 ENCOUNTER — Telehealth: Payer: Self-pay | Admitting: *Deleted

## 2014-03-06 NOTE — Telephone Encounter (Signed)
Pt called the clinic requesting a different medication than the one ordered on 03/04/14 due to cost ($96) and she still continues to bleed with the pessary and wants to know if this is normal.  Contacted [facility] pharmacy, Premarin is $305.42, Estrace $278 without filing insurance at both locations. Informed patient of the cost of medication at both stores.  Discussed with Dr. Ihor Dow the cost of the medication and the bleeding the patient is experiencing.  She states the bleeding is normal.  Notified patient that bleeding is normal. Pt verbalizes understanding.

## 2014-05-01 ENCOUNTER — Ambulatory Visit: Payer: Medicare Other | Admitting: Family Medicine

## 2014-05-03 ENCOUNTER — Ambulatory Visit: Payer: Medicare Other | Admitting: Family Medicine

## 2014-05-04 ENCOUNTER — Ambulatory Visit (INDEPENDENT_AMBULATORY_CARE_PROVIDER_SITE_OTHER): Payer: Medicare Other | Admitting: Family Medicine

## 2014-05-04 VITALS — BP 149/90 | HR 84 | Temp 97.3°F | Ht 59.0 in | Wt 154.0 lb

## 2014-05-04 DIAGNOSIS — R7309 Other abnormal glucose: Secondary | ICD-10-CM

## 2014-05-04 DIAGNOSIS — R52 Pain, unspecified: Secondary | ICD-10-CM

## 2014-05-04 DIAGNOSIS — R61 Generalized hyperhidrosis: Secondary | ICD-10-CM

## 2014-05-04 DIAGNOSIS — B349 Viral infection, unspecified: Secondary | ICD-10-CM

## 2014-05-04 DIAGNOSIS — R7303 Prediabetes: Secondary | ICD-10-CM

## 2014-05-04 LAB — POCT INFLUENZA A/B
INFLUENZA A, POC: NEGATIVE
Influenza B, POC: NEGATIVE

## 2014-05-04 MED ORDER — OSELTAMIVIR PHOSPHATE 75 MG PO CAPS: 75.0000 mg | ORAL_CAPSULE | Freq: Two times a day (BID) | ORAL | Status: DC

## 2014-05-04 NOTE — Progress Notes (Signed)
Subjective:  Patient ID: Misty Cervantes, female    DOB: 1945/08/20  Age: 69 y.o. MRN: [id number]  CC: cold sweats; Cough; Nausea; and Headache   HPI Misty Cervantes presents for 5 days of night sweats over the last 2 days has been associated with fatigue. No fever but perhaps a little bit of a chill. The headache has been frontal.. She does note some vague chest discomfort. There has been no radiation. She says that she has been told she had an unusual EKG in the past she is concerned that the sweats might represent angina. Cardiac risk factors include age postmenopausal. Hypertension as well as family history. She is not a diabetic denies smoking. She has been diagnosed with prediabetes and hyperlipidemia. She did not check her blood sugar during the sweating spells. There was no change in level of consciousness associated. However, they do interfere with sleep and have caused moderate exhaustion.  History Misty Cervantes has a past medical history of Cataract; HTN (hypertension); Osteoarthritis; Hyperlipemia; Anemia; Gastric ulcer (11-17-11); and Hyperplastic polyp of intestine (11-17-11).   She has past surgical history that includes Total vaginal hysterectomy (2012 ); Cataract extraction; Retinal detachment surgery; Hip Arthroplasty (2011); Colonoscopy (2013); and Upper gi endoscopy (2013).   Her family history includes Diabetes in her mother, sister, and sister; Heart attack in her father and mother; Kidney cancer in her father.She reports that she has never smoked. She has never used smokeless tobacco. She reports that she does not drink alcohol or use illicit drugs.  Current Outpatient Prescriptions on File Prior to Visit  Medication Sig Dispense Refill  . aspirin 81 MG chewable tablet Chew 81 mg by mouth daily.    . diclofenac (VOLTAREN) 75 MG EC tablet Take 75 mg by mouth 2 (two) times daily.    Marland Kitchen DM-Phenylephrine-Acetaminophen (ALKA-SELTZER PLS SINUS & COUGH) 10-5-325 MG CAPS Take 2 capsules by  mouth as needed.    . furosemide (LASIX) 20 MG tablet Take 1 tablet (20 mg total) by mouth daily. 30 tablet 3  . HYDROcodone-acetaminophen (VICODIN) 5-500 MG per tablet Take 1 tablet by mouth every 6 (six) hours as needed.      . Multiple Vitamin (MULTIVITAMIN) tablet Take 1 tablet by mouth daily.      . Potassium 95 MG TBCR Take 1 tablet by mouth daily.      . pravastatin (PRAVACHOL) 40 MG tablet Take 1 tablet (40 mg total) by mouth daily. 90 tablet 3  . triamterene-hydrochlorothiazide (DYAZIDE) 37.5-25 MG per capsule TAKE ONE CAPSULE BY MOUTH ONE TIME DAILY 90 capsule 1   No current facility-administered medications on file prior to visit.    ROS Review of Systems  Constitutional: Negative for fever, appetite change and unexpected weight change.  HENT: Negative for congestion, ear pain, hearing loss, postnasal drip, rhinorrhea, sneezing, sore throat and trouble swallowing.   Eyes: Negative for pain.  Respiratory: Positive for chest tightness. Negative for shortness of breath.   Cardiovascular: Negative for palpitations.  Gastrointestinal: Negative for nausea, vomiting, abdominal pain, diarrhea and constipation.  Genitourinary: Negative for dysuria, frequency and menstrual problem.  Musculoskeletal: Negative for joint swelling and arthralgias.  Skin: Negative for rash.  Neurological: Negative for dizziness, weakness, numbness and headaches.  Psychiatric/Behavioral: Negative for dysphoric mood and agitation.    Objective:  BP 149/90 mmHg  Pulse 84  Temp(Src) 97.3 F (36.3 C) (Oral)  Ht 4' 11"  (1.499 m)  Wt 154 lb (69.854 kg)  BMI 31.09 kg/m2  BP Readings  from Last 3 Encounters:  05/04/14 149/90  03/04/14 122/77  10/29/13 143/84    Wt Readings from Last 3 Encounters:  05/04/14 154 lb (69.854 kg)  03/04/14 153 lb 11.2 oz (69.718 kg)  10/29/13 158 lb 3.2 oz (71.759 kg)     Physical Exam  Constitutional: She is oriented to person, place, and time. She appears  well-developed and well-nourished. No distress.  HENT:  Head: Normocephalic and atraumatic.  Right Ear: External ear normal.  Left Ear: External ear normal.  Nose: Nose normal.  Mouth/Throat: Oropharynx is clear and moist.  Eyes: Conjunctivae and EOM are normal. Pupils are equal, round, and reactive to light.  Neck: Normal range of motion. Neck supple. No thyromegaly present.  Cardiovascular: Normal rate, regular rhythm and normal heart sounds.   No murmur heard. Pulmonary/Chest: Effort normal and breath sounds normal. No respiratory distress. She has no wheezes. She has no rales.  Abdominal: Soft. Bowel sounds are normal. She exhibits no distension. There is no tenderness.  Lymphadenopathy:    She has no cervical adenopathy.  Neurological: She is alert and oriented to person, place, and time. She has normal reflexes.  Skin: Skin is warm and dry.  Psychiatric: She has a normal mood and affect. Her behavior is normal. Judgment and thought content normal.    Lab Results  Component Value Date   HGBA1C 6.3% 12/14/2012   HGBA1C 6.2% 08/16/2012    Lab Results  Component Value Date   WBC 5.3 11/09/2011   HGB 12.5 11/09/2011   HCT 37.8 11/09/2011   PLT 245.0 11/09/2011   GLUCOSE 150* 10/29/2013   CHOL 216* 12/14/2012   TRIG 212* 10/29/2013   HDL 40 10/29/2013   LDLCALC 148* 10/29/2013   ALT 59* 10/29/2013   AST 40 10/29/2013   NA 141 10/29/2013   K 4.5 10/29/2013   CL 103 10/29/2013   CREATININE 0.96 10/29/2013   BUN 13 10/29/2013   CO2 26 10/29/2013   INR 1.68* 03/06/2010   HGBA1C 6.3% 12/14/2012    Mm Digital Screening Bilateral  08/18/2013   CLINICAL DATA:  Screening. Prior benign stereotactic biopsy of the right breast in 2011.  EXAM: DIGITAL SCREENING BILATERAL MAMMOGRAM WITH CAD  COMPARISON:  Previous exam(s)  ACR Breast Density Category a: The breast tissue is almost entirely fatty.  FINDINGS: There are no findings suspicious for malignancy. Images were processed with  CAD.  IMPRESSION: No mammographic evidence of malignancy. A result letter of this screening mammogram will be mailed directly to the patient.  RECOMMENDATION: Screening mammogram in one year. (Code:SM-B-01Y)  BI-RADS CATEGORY  1: Negative.   Electronically Signed   By: Willadean Carol M.D.   On: 08/18/2013 13:18    Assessment & Plan:   Misty Cervantes was seen today for cold sweats, cough, nausea and headache.  Diagnoses and all orders for this visit:  Acute viral syndrome Orders: -     CBC with Differential/Platelet -     CMP14+EGFR -     oseltamivir (TAMIFLU) 75 MG capsule; Take 1 capsule (75 mg total) by mouth 2 (two) times daily.  Body aches Orders: -     POCT Influenza A/B -     CBC with Differential/Platelet -     CMP14+EGFR -     TSH  Night sweats Orders: -     EKG 12-Lead -     CBC with Differential/Platelet -     CMP14+EGFR -     TSH  Pre-diabetes Orders: -  CBC with Differential/Platelet -     CMP14+EGFR -     TSH   I have discontinued Ms. Fahrner's omeprazole and conjugated estrogens. I am also having her start on oseltamivir. Additionally, I am having her maintain her multivitamin, Potassium, HYDROcodone-acetaminophen, DM-Phenylephrine-Acetaminophen, aspirin, diclofenac, pravastatin, furosemide, and triamterene-hydrochlorothiazide.  Meds ordered this encounter  Medications  . oseltamivir (TAMIFLU) 75 MG capsule    Sig: Take 1 capsule (75 mg total) by mouth 2 (two) times daily.    Dispense:  10 capsule    Refill:  0    EKG shows no acute changes. Follow-up: Return if symptoms worsen or fail to improve.  Claretta Fraise, M.D.

## 2014-05-08 ENCOUNTER — Other Ambulatory Visit: Payer: Self-pay | Admitting: Family Medicine

## 2014-05-10 ENCOUNTER — Telehealth: Payer: Self-pay | Admitting: Family Medicine

## 2014-05-10 NOTE — Telephone Encounter (Signed)
Mailbox was full  Unable to leave message Lab order is entered in [employer]

## 2014-06-19 ENCOUNTER — Ambulatory Visit: Payer: Medicare Other

## 2014-06-19 ENCOUNTER — Other Ambulatory Visit: Payer: Medicare Other

## 2014-06-26 ENCOUNTER — Ambulatory Visit: Payer: Self-pay | Admitting: Family Medicine

## 2014-06-26 ENCOUNTER — Other Ambulatory Visit: Payer: Self-pay | Admitting: Family

## 2014-06-26 ENCOUNTER — Telehealth: Payer: Self-pay | Admitting: Family

## 2014-06-26 DIAGNOSIS — Z780 Asymptomatic menopausal state: Secondary | ICD-10-CM

## 2014-07-04 ENCOUNTER — Ambulatory Visit (INDEPENDENT_AMBULATORY_CARE_PROVIDER_SITE_OTHER): Payer: Medicare Other | Admitting: Obstetrics & Gynecology

## 2014-07-04 ENCOUNTER — Encounter: Payer: Self-pay | Admitting: Obstetrics & Gynecology

## 2014-07-04 VITALS — BP 122/89 | HR 82 | Temp 98.0°F | Ht 60.5 in | Wt 153.8 lb

## 2014-07-04 DIAGNOSIS — N952 Postmenopausal atrophic vaginitis: Secondary | ICD-10-CM | POA: Diagnosis not present

## 2014-07-04 NOTE — Progress Notes (Signed)
Called Estradiol vaginal cream .2mg /.02% (1/2 gram per vagina 3X per week; dispense 30grams) into [facility] [phone]. Information regarding prescription and pharmacy number and location given to patient.

## 2014-07-04 NOTE — Progress Notes (Signed)
Subjective:     Patient ID: Misty Cervantes, female   DOB: 1945-11-08, 69 y.o.   MRN: [id number]  HPI Pt presents with c/o bleeding from vagina today.  She reports that her pessary was out for a week but she went ot wipe and noticed blood.  (she brought in her tissue to show the blood).  This is the first bleeding she noted since 02/2014 when she was seen for the same thing.  She  Was prescribed topical EES but, she was not able to get it due to cost.  She is s/p hyst    Review of Systems     Objective:   Physical Exam BP 122/89 mmHg  Pulse 82  Temp(Src) 98 F (36.7 C)  Ht 5' 0.5" (1.537 m)  Wt 153 lb 12.8 oz (69.763 kg)  BMI 29.53 kg/m2 GU: EGBUS: no lesions Vagina: no blood in vault; there is some evidence on the outer vulva of prev blood.  This is very scant.  (she did bring tissue with blood on it in a small baggie).  The tissue is very thin and atrophic. Adnexa: no masses; non tender       Assessment:     PMPB- thought due to atrophic vaginitis as prev. Pt not able to get topical EES due to high cost of meds    Plan:     Topical EES will order from [facility] F/u in 3 month or sooner prn

## 2014-07-04 NOTE — Progress Notes (Signed)
Patient ID: Misty Cervantes, female   DOB: June 29, 1945, 69 y.o.   MRN: [id number] Pt has not had pessary in for the past week, seen bright red bleeding with clot today after going to the bathroom.;

## 2014-07-10 ENCOUNTER — Ambulatory Visit: Payer: Medicare Other

## 2014-07-10 ENCOUNTER — Other Ambulatory Visit: Payer: Medicare Other

## 2014-07-15 ENCOUNTER — Other Ambulatory Visit: Payer: Medicare Other

## 2014-07-15 ENCOUNTER — Ambulatory Visit: Payer: Medicare Other | Admitting: Family

## 2014-07-23 ENCOUNTER — Ambulatory Visit: Payer: Medicare Other

## 2014-07-23 ENCOUNTER — Ambulatory Visit (INDEPENDENT_AMBULATORY_CARE_PROVIDER_SITE_OTHER): Payer: Medicare Other | Admitting: Family Medicine

## 2014-07-23 ENCOUNTER — Encounter: Payer: Self-pay | Admitting: Family Medicine

## 2014-07-23 VITALS — BP 126/86 | HR 90 | Temp 97.1°F | Ht 60.6 in | Wt 154.0 lb

## 2014-07-23 DIAGNOSIS — I10 Essential (primary) hypertension: Secondary | ICD-10-CM

## 2014-07-23 DIAGNOSIS — E785 Hyperlipidemia, unspecified: Secondary | ICD-10-CM

## 2014-07-23 DIAGNOSIS — E1169 Type 2 diabetes mellitus with other specified complication: Secondary | ICD-10-CM | POA: Insufficient documentation

## 2014-07-23 NOTE — Progress Notes (Deleted)
   Subjective:    Patient ID: Misty Cervantes, female    DOB: 07/16/1945, 68 y.o.   MRN: [id number]  HPI 68-year-old female here to follow-up hypertension hyperlipidemia and early diabetes. She really has no complaints but is having some problems with bladder prolapse. She had surgery that failed and has been using a pessary but now has some bleeding. She was seen by gynecologist who wanted to give her estrogen cream but cost was prohibitive. I did find a source who would compounded cream and she asked about using that today Otherwise she is exercising regularly about 4 days a week at the [employer]  Patient Active Problem List   Diagnosis Date Noted  . Pre-diabetes 02/19/2013  . Hyperglycemia 12/14/2012  . Other and unspecified hyperlipidemia 08/11/2012  . Genu valgum 08/11/2012  . Arthritis of both knees 08/11/2012  . Overweight(278.02) 02/25/2010  . Essential hypertension, benign 02/25/2010   Outpatient Encounter Prescriptions as of 07/23/2014  Medication Sig  . aspirin 81 MG chewable tablet Chew 81 mg by mouth daily.  . diclofenac (VOLTAREN) 75 MG EC tablet Take 75 mg by mouth 2 (two) times daily.  . furosemide (LASIX) 20 MG tablet Take 1 tablet (20 mg total) by mouth daily.  . HYDROcodone-acetaminophen (VICODIN) 5-500 MG per tablet Take 1 tablet by mouth every 6 (six) hours as needed.    . Multiple Vitamin (MULTIVITAMIN) tablet Take 1 tablet by mouth daily.    . pravastatin (PRAVACHOL) 40 MG tablet Take 1 tablet (40 mg total) by mouth daily.  . triamterene-hydrochlorothiazide (DYAZIDE) 37.5-25 MG per capsule TAKE ONE CAPSULE BY MOUTH ONE TIME DAILY   . DM-Phenylephrine-Acetaminophen (ALKA-SELTZER PLS SINUS & COUGH) 10-5-325 MG CAPS Take 2 capsules by mouth as needed.  . Potassium 95 MG TBCR Take 1 tablet by mouth daily.     No facility-administered encounter medications on file as of 07/23/2014.      Review of Systems  Constitutional: Negative.   HENT: Negative.   Eyes: Negative.     Respiratory: Negative.   Cardiovascular: Negative.   Gastrointestinal: Negative.   Endocrine: Negative.   Genitourinary: Positive for vaginal bleeding.  Musculoskeletal: Positive for arthralgias.  Hematological: Negative.   Psychiatric/Behavioral: Negative.        Objective:   Physical Exam  Constitutional: She is oriented to person, place, and time. She appears well-developed and well-nourished.  Cardiovascular: Normal rate and regular rhythm.   Pulmonary/Chest: Effort normal and breath sounds normal.  Neurological: She is alert and oriented to person, place, and time.  Psychiatric: She has a normal mood and affect.          Assessment & Plan:  1. Essential hypertension, benign Blood pressure fairly well controlled on current regimen of triamterene hydrochlorothiazide plan continue same - CMP14+EGFR; Future  2. Hyperlipidemia When last checked, LDL cholesterol was not at goal. This takes on more importance if she truly does have diabetes which we will check when we do the lipids on a fasting specimen in the morning  Stephen M Miller MD    - Lipid panel; Future 

## 2014-07-24 ENCOUNTER — Other Ambulatory Visit (INDEPENDENT_AMBULATORY_CARE_PROVIDER_SITE_OTHER): Payer: Medicare Other

## 2014-07-24 DIAGNOSIS — E785 Hyperlipidemia, unspecified: Secondary | ICD-10-CM

## 2014-07-24 DIAGNOSIS — I10 Essential (primary) hypertension: Secondary | ICD-10-CM

## 2014-07-24 NOTE — Progress Notes (Signed)
Lab only 

## 2014-07-25 LAB — LIPID PANEL
CHOL/HDL RATIO: 4.7 ratio — AB (ref 0.0–4.4)
Cholesterol, Total: 208 mg/dL — ABNORMAL HIGH (ref 100–199)
HDL: 44 mg/dL (ref >39)
LDL Calculated: 125 mg/dL — ABNORMAL HIGH (ref 0–99)
TRIGLYCERIDES: 196 mg/dL — AB (ref 0–149)
VLDL Cholesterol Cal: 39 mg/dL (ref 5–40)

## 2014-07-25 LAB — CMP14+EGFR
ALT: 48 IU/L — AB (ref 0–32)
AST: 35 IU/L (ref 0–40)
Albumin/Globulin Ratio: 1.8 (ref 1.1–2.5)
Albumin: 4.6 g/dL (ref 3.6–4.8)
Alkaline Phosphatase: 81 IU/L (ref 39–117)
BUN/Creatinine Ratio: 20 (ref 11–26)
BUN: 21 mg/dL (ref 8–27)
Bilirubin Total: 0.7 mg/dL (ref 0.0–1.2)
CO2: 26 mmol/L (ref 18–29)
Calcium: 9.7 mg/dL (ref 8.7–10.3)
Chloride: 95 mmol/L — ABNORMAL LOW (ref 97–108)
Creatinine, Ser: 1.04 mg/dL — ABNORMAL HIGH (ref 0.57–1.00)
GFR calc Af Amer: 63 mL/min/{1.73_m2} (ref >59)
GFR, EST NON AFRICAN AMERICAN: 55 mL/min/{1.73_m2} — AB (ref >59)
Globulin, Total: 2.6 g/dL (ref 1.5–4.5)
Glucose: 132 mg/dL — ABNORMAL HIGH (ref 65–99)
POTASSIUM: 3.7 mmol/L (ref 3.5–5.2)
SODIUM: 138 mmol/L (ref 134–144)
Total Protein: 7.2 g/dL (ref 6.0–8.5)

## 2014-09-26 NOTE — Progress Notes (Signed)
   Subjective:    Patient ID: Misty Cervantes, female    DOB: [birth year], 69 y.o.   MRN: [id number]  HPI 69 year old female here to follow-up hypertension hyperlipidemia and early diabetes. She really has no complaints but is having some problems with bladder prolapse. She had surgery that failed and has been using a pessary but now has some bleeding. She was seen by gynecologist who wanted to give her estrogen cream but cost was prohibitive. I did find a source who would compounded cream and she asked about using that today Otherwise she is exercising regularly about 4 days a week at the [facility]  Patient Active Problem List   Diagnosis Date Noted  . Pre-diabetes 02/19/2013  . Hyperglycemia 12/14/2012  . Other and unspecified hyperlipidemia 08/11/2012  . Genu valgum 08/11/2012  . Arthritis of both knees 08/11/2012  . Overweight(278.02) 02/25/2010  . Essential hypertension, benign 02/25/2010   Outpatient Encounter Prescriptions as of 07/23/2014  Medication Sig  . aspirin 81 MG chewable tablet Chew 81 mg by mouth daily.  . diclofenac (VOLTAREN) 75 MG EC tablet Take 75 mg by mouth 2 (two) times daily.  . furosemide (LASIX) 20 MG tablet Take 1 tablet (20 mg total) by mouth daily.  Marland Kitchen HYDROcodone-acetaminophen (VICODIN) 5-500 MG per tablet Take 1 tablet by mouth every 6 (six) hours as needed.    . Multiple Vitamin (MULTIVITAMIN) tablet Take 1 tablet by mouth daily.    . pravastatin (PRAVACHOL) 40 MG tablet Take 1 tablet (40 mg total) by mouth daily.  Marland Kitchen triamterene-hydrochlorothiazide (DYAZIDE) 37.5-25 MG per capsule TAKE ONE CAPSULE BY MOUTH ONE TIME DAILY   . DM-Phenylephrine-Acetaminophen (ALKA-SELTZER PLS SINUS & COUGH) 10-5-325 MG CAPS Take 2 capsules by mouth as needed.  . Potassium 95 MG TBCR Take 1 tablet by mouth daily.     No facility-administered encounter medications on file as of 07/23/2014.      Review of Systems  Constitutional: Negative.   HENT: Negative.   Eyes: Negative.     Respiratory: Negative.   Cardiovascular: Negative.   Gastrointestinal: Negative.   Endocrine: Negative.   Genitourinary: Positive for vaginal bleeding.  Musculoskeletal: Positive for arthralgias.  Hematological: Negative.   Psychiatric/Behavioral: Negative.        Objective:   Physical Exam  Constitutional: She is oriented to person, place, and time. She appears well-developed and well-nourished.  Cardiovascular: Normal rate and regular rhythm.   Pulmonary/Chest: Effort normal and breath sounds normal.  Neurological: She is alert and oriented to person, place, and time.  Psychiatric: She has a normal mood and affect.          Assessment & Plan:  1. Essential hypertension, benign Blood pressure fairly well controlled on current regimen of triamterene hydrochlorothiazide plan continue same - CMP14+EGFR; Future  2. Hyperlipidemia When last checked, LDL cholesterol was not at goal. This takes on more importance if she truly does have diabetes which we will check when we do the lipids on a fasting specimen in the morning  Wardell Honour MD    - Lipid panel; Future

## 2014-10-04 ENCOUNTER — Other Ambulatory Visit: Payer: Self-pay | Admitting: *Deleted

## 2014-10-04 MED ORDER — FUROSEMIDE 20 MG PO TABS: 20.0000 mg | ORAL_TABLET | Freq: Every day | ORAL | Status: DC

## 2014-11-06 ENCOUNTER — Other Ambulatory Visit: Payer: Self-pay

## 2014-11-06 MED ORDER — PRAVASTATIN SODIUM 40 MG PO TABS: 40.0000 mg | ORAL_TABLET | Freq: Every day | ORAL | Status: DC

## 2014-11-06 MED ORDER — TRIAMTERENE-HCTZ 37.5-25 MG PO CAPS
1.0000 | ORAL_CAPSULE | Freq: Every day | ORAL | Status: DC
Start: 2014-11-06 — End: 2015-01-28

## 2015-01-28 ENCOUNTER — Encounter: Payer: Self-pay | Admitting: Family Medicine

## 2015-01-28 ENCOUNTER — Ambulatory Visit (INDEPENDENT_AMBULATORY_CARE_PROVIDER_SITE_OTHER): Payer: Medicare Other | Admitting: Family Medicine

## 2015-01-28 VITALS — BP 123/70 | HR 85 | Temp 97.3°F | Ht 60.6 in | Wt 158.0 lb

## 2015-01-28 DIAGNOSIS — Z23 Encounter for immunization: Secondary | ICD-10-CM | POA: Diagnosis not present

## 2015-01-28 DIAGNOSIS — I10 Essential (primary) hypertension: Secondary | ICD-10-CM

## 2015-01-28 DIAGNOSIS — R739 Hyperglycemia, unspecified: Secondary | ICD-10-CM | POA: Diagnosis not present

## 2015-01-28 LAB — POCT GLYCOSYLATED HEMOGLOBIN (HGB A1C): HEMOGLOBIN A1C: 6.2

## 2015-01-28 MED ORDER — TRIAMTERENE-HCTZ 37.5-25 MG PO CAPS: 1.0000 | ORAL_CAPSULE | Freq: Every day | ORAL | Status: DC

## 2015-01-28 NOTE — Progress Notes (Signed)
   Subjective:    Patient ID: Misty Cervantes, female    DOB: 04/22/1945, 69 y.o.   MRN: [id number]  HPI 69 year old female with borderline diabetes, hyperlipidemia, and hypertension. She is status post left hip replacement with pending knee replacement whenever symptoms warrant. Her A1c was elevated at 6.32 years ago. Fasting blood sugar 6 months ago showed mild diabetes. She was screened for cognitive abilities depression both of which are negative. She exercises regularly at [facility] and has primary responsibilities for a 51 an 34-year-old.  Patient Active Problem List   Diagnosis Date Noted  . Hyperlipidemia 07/23/2014  . Pre-diabetes 02/19/2013  . Hyperglycemia 12/14/2012  . Other and unspecified hyperlipidemia 08/11/2012  . Genu valgum 08/11/2012  . Arthritis of both knees 08/11/2012  . Overweight(278.02) 02/25/2010  . Essential hypertension, benign 02/25/2010   Outpatient Encounter Prescriptions as of 01/28/2015  Medication Sig  . aspirin 81 MG chewable tablet Chew 81 mg by mouth daily.  . diclofenac (VOLTAREN) 75 MG EC tablet Take 75 mg by mouth 2 (two) times daily.  Marland Kitchen DM-Phenylephrine-Acetaminophen (ALKA-SELTZER PLS SINUS & COUGH) 10-5-325 MG CAPS Take 2 capsules by mouth as needed.  . furosemide (LASIX) 20 MG tablet Take 1 tablet (20 mg total) by mouth daily.  Marland Kitchen HYDROcodone-acetaminophen (VICODIN) 5-500 MG per tablet Take 1 tablet by mouth every 6 (six) hours as needed.    . Multiple Vitamin (MULTIVITAMIN) tablet Take 1 tablet by mouth daily.    . Potassium 95 MG TBCR Take 1 tablet by mouth daily.    . pravastatin (PRAVACHOL) 40 MG tablet Take 1 tablet (40 mg total) by mouth daily.  Marland Kitchen triamterene-hydrochlorothiazide (DYAZIDE) 37.5-25 MG per capsule Take 1 each (1 capsule total) by mouth daily.   No facility-administered encounter medications on file as of 01/28/2015.      Review of Systems  Constitutional: Negative.   HENT: Negative.   Cardiovascular: Negative.     Gastrointestinal: Negative.   Psychiatric/Behavioral: Negative.        Objective:   Physical Exam  Constitutional: She is oriented to person, place, and time. She appears well-developed and well-nourished.  Cardiovascular: Normal rate, regular rhythm and normal heart sounds.   Pulmonary/Chest: Effort normal and breath sounds normal.  Neurological: She is alert and oriented to person, place, and time.          Assessment & Plan:  1. Essential hypertension, benign Blood pressures are controlled with diuretic - POCT glycosylated hemoglobin (Hb A1C)  2. Hyperglycemia As noted above she probably has mild diabetes. Even though do not typically use A1c to make a diagnosis if greater than 6.5 I think that is consistent with diabetes.  Wardell Honour MD

## 2015-01-29 ENCOUNTER — Telehealth: Payer: Self-pay | Admitting: Family Medicine

## 2015-01-29 NOTE — Telephone Encounter (Signed)
Patient aware of results.

## 2015-02-23 ENCOUNTER — Other Ambulatory Visit: Payer: Self-pay | Admitting: Family Medicine

## 2015-03-13 ENCOUNTER — Other Ambulatory Visit: Payer: Self-pay

## 2015-03-13 DIAGNOSIS — Z1231 Encounter for screening mammogram for malignant neoplasm of breast: Secondary | ICD-10-CM

## 2015-03-16 DIAGNOSIS — Z96659 Presence of unspecified artificial knee joint: Secondary | ICD-10-CM

## 2015-03-16 HISTORY — DX: Presence of unspecified artificial knee joint: Z96.659

## 2015-04-10 ENCOUNTER — Ambulatory Visit
Admission: RE | Admit: 2015-04-10 | Discharge: 2015-04-10 | Disposition: A | Discharge status: Home / Self Care | Payer: Medicare Other | Source: Ambulatory Visit

## 2015-04-10 DIAGNOSIS — Z1231 Encounter for screening mammogram for malignant neoplasm of breast: Secondary | ICD-10-CM

## 2015-04-14 ENCOUNTER — Other Ambulatory Visit: Payer: Self-pay | Admitting: Family Medicine

## 2015-04-14 DIAGNOSIS — R928 Other abnormal and inconclusive findings on diagnostic imaging of breast: Secondary | ICD-10-CM

## 2015-04-18 ENCOUNTER — Ambulatory Visit
Admission: RE | Admit: 2015-04-18 | Discharge: 2015-04-18 | Disposition: A | Discharge status: Home / Self Care | Payer: Medicare Other | Source: Ambulatory Visit | Attending: Family Medicine | Admitting: Family Medicine

## 2015-04-18 DIAGNOSIS — R928 Other abnormal and inconclusive findings on diagnostic imaging of breast: Secondary | ICD-10-CM

## 2015-05-21 ENCOUNTER — Other Ambulatory Visit: Payer: Self-pay | Admitting: Family Medicine

## 2015-06-18 ENCOUNTER — Other Ambulatory Visit: Payer: Self-pay

## 2015-06-18 MED ORDER — FUROSEMIDE 20 MG PO TABS: 20.0000 mg | ORAL_TABLET | Freq: Every day | ORAL | Status: DC

## 2015-07-24 ENCOUNTER — Other Ambulatory Visit: Payer: Self-pay | Admitting: Family Medicine

## 2015-08-16 ENCOUNTER — Other Ambulatory Visit: Payer: Self-pay | Admitting: Family Medicine

## 2015-08-25 ENCOUNTER — Ambulatory Visit (INDEPENDENT_AMBULATORY_CARE_PROVIDER_SITE_OTHER): Payer: Medicare Other | Admitting: Family Medicine

## 2015-08-25 ENCOUNTER — Encounter: Payer: Self-pay | Admitting: Family Medicine

## 2015-08-25 VITALS — BP 129/76 | HR 91 | Temp 97.4°F | Ht 60.6 in | Wt 156.0 lb

## 2015-08-25 DIAGNOSIS — I10 Essential (primary) hypertension: Secondary | ICD-10-CM

## 2015-08-25 DIAGNOSIS — E785 Hyperlipidemia, unspecified: Secondary | ICD-10-CM

## 2015-08-25 DIAGNOSIS — Z0000 Encounter for general adult medical examination without abnormal findings: Secondary | ICD-10-CM

## 2015-08-25 DIAGNOSIS — R7303 Prediabetes: Secondary | ICD-10-CM

## 2015-08-25 MED ORDER — PRAVASTATIN SODIUM 40 MG PO TABS: 40.0000 mg | ORAL_TABLET | Freq: Every day | ORAL | Status: DC

## 2015-08-25 NOTE — Patient Instructions (Signed)
Medicare Annual Wellness Visit  [place] and the medical providers at [facility] strive to bring you the best medical care.  In doing so we not only want to address your current medical conditions and concerns but also to detect new conditions early and prevent illness, disease and health-related problems.    Medicare offers a yearly Wellness Visit which allows our clinical staff to assess your need for preventative services including immunizations, lifestyle education, counseling to decrease risk of preventable diseases and screening for fall risk and other medical concerns.    This visit is provided free of charge (no copay) for all Medicare recipients. The clinical pharmacists at [facility] have begun to conduct these Wellness Visits which will also include a thorough review of all your medications.    As you primary medical provider recommend that you make an appointment for your Annual Wellness Visit if you have not done so already this year.  You may set up this appointment before you leave today or you may call back ([phone]) and schedule an appointment.  Please make sure when you call that you mention that you are scheduling your Annual Wellness Visit with the clinical pharmacist so that the appointment may be made for the proper length of time.     Continue current medications. Continue good therapeutic lifestyle changes which include good diet and exercise. Fall precautions discussed with patient. If an FOBT was given today- please return it to our front desk. If you are over 50 years old - you may need Prevnar 13 or the adult Pneumonia vaccine.  **Flu shots are available--- please call and schedule a FLU-CLINIC appointment**  After your visit with us today you will receive a survey in the mail or online from [employer] regarding your care with us. Please take a moment to fill this out. Your feedback is very  important to us as you can help us better understand your patient needs as well as improve your experience and satisfaction. WE CARE ABOUT YOU!!!    

## 2015-08-25 NOTE — Progress Notes (Signed)
Subjective:    Patient ID: Misty Cervantes, female    DOB: [birth year], 70 y.o.   MRN: [id number]  HPI Patient is here today for annual wellness exam and follow up of chronic medical problems which includes hypertension and hyperlipidemia. She is taking medications regularly. She is scheduled for Dr Elmyra Ricks to do a Total right knee replacement on 11/03/15.  No other specific complaints today we did not do EKG or chest x-ray but she may need that prior to her surgery which is scheduled for third week in August     Patient Active Problem List   Diagnosis Date Noted  . Hyperlipidemia 07/23/2014  . Pre-diabetes 02/19/2013  . Hyperglycemia 12/14/2012  . Other and unspecified hyperlipidemia 08/11/2012  . Genu valgum 08/11/2012  . Arthritis of both knees 08/11/2012  . Overweight(278.02) 02/25/2010  . Essential hypertension, benign 02/25/2010   Outpatient Encounter Prescriptions as of 08/25/2015  Medication Sig  . aspirin 81 MG chewable tablet Chew 81 mg by mouth daily.  . diclofenac (VOLTAREN) 75 MG EC tablet Take 75 mg by mouth 2 (two) times daily.  Marland Kitchen HYDROcodone-acetaminophen (VICODIN) 5-500 MG per tablet Take 1 tablet by mouth every 6 (six) hours as needed.    . Multiple Vitamin (MULTIVITAMIN) tablet Take 1 tablet by mouth daily.    . Potassium 95 MG TBCR Take 1 tablet by mouth daily.    . pravastatin (PRAVACHOL) 40 MG tablet Take 1 tablet (40 mg total) by mouth daily.  Marland Kitchen triamterene-hydrochlorothiazide (DYAZIDE) 37.5-25 MG capsule TAKE 1 CAPSULE EACH BY MOUTH DAILY.  . [DISCONTINUED] DM-Phenylephrine-Acetaminophen (ALKA-SELTZER PLS SINUS & COUGH) 10-5-325 MG CAPS Take 2 capsules by mouth as needed.  . [DISCONTINUED] pravastatin (PRAVACHOL) 40 MG tablet TAKE 1 TABLET BY MOUTH DAILY.  . furosemide (LASIX) 20 MG tablet Take 1 tablet (20 mg total) by mouth daily. (Patient not taking: Reported on 08/25/2015)   No facility-administered encounter medications on file as of 08/25/2015.       Review of Systems  Constitutional: Negative.   HENT: Negative.   Eyes: Negative.   Respiratory: Negative.   Cardiovascular: Negative.   Gastrointestinal: Negative.   Endocrine: Negative.   Genitourinary: Negative.   Musculoskeletal: Positive for arthralgias (right knee pain).  Skin: Negative.   Allergic/Immunologic: Negative.   Neurological: Negative.   Hematological: Negative.   Psychiatric/Behavioral: Negative.        Objective:   Physical Exam  Constitutional: She is oriented to person, place, and time. She appears well-developed and well-nourished.  Eyes: Conjunctivae and EOM are normal.  Neck: Normal range of motion. Neck supple.  Cardiovascular: Normal rate, regular rhythm and normal heart sounds.   Pulmonary/Chest: Effort normal and breath sounds normal.  Abdominal: Soft. Bowel sounds are normal.  Musculoskeletal: Normal range of motion.  Neurological: She is alert and oriented to person, place, and time. She has normal reflexes.  Skin: Skin is warm and dry.  Psychiatric: She has a normal mood and affect. Her behavior is normal. Thought content normal.    BP 129/76 mmHg  Pulse 91  Temp(Src) 97.4 F (36.3 C) (Oral)  Ht 5' 0.6" (1.539 m)  Wt 156 lb (70.761 kg)  BMI 29.88 kg/m2       Assessment & Plan:  1. Annual physical exam Exam within normal limits. Blood pressure is well controlled. - CMP14+EGFR - Lipid panel  2. Essential hypertension, benign Continue with triamterene hydrochlorothiazide. Blood pressure is good - CMP14+EGFR  3. Hyperlipidemia Lipids were last checked in May  2016. LDL not quite at goal - Lipid panel  Wardell Honour MD

## 2015-08-26 LAB — CMP14+EGFR
ALBUMIN: 4.4 g/dL (ref 3.5–4.8)
ALT: 48 IU/L — ABNORMAL HIGH (ref 0–32)
AST: 34 IU/L (ref 0–40)
Albumin/Globulin Ratio: 1.8 (ref 1.2–2.2)
Alkaline Phosphatase: 90 IU/L (ref 39–117)
BUN / CREAT RATIO: 20 (ref 12–28)
BUN: 23 mg/dL (ref 8–27)
Bilirubin Total: 0.7 mg/dL (ref 0.0–1.2)
CO2: 27 mmol/L (ref 18–29)
CREATININE: 1.16 mg/dL — AB (ref 0.57–1.00)
Calcium: 9.9 mg/dL (ref 8.7–10.3)
Chloride: 92 mmol/L — ABNORMAL LOW (ref 96–106)
GFR, EST AFRICAN AMERICAN: 55 mL/min/{1.73_m2} — AB (ref >59)
GFR, EST NON AFRICAN AMERICAN: 48 mL/min/{1.73_m2} — AB (ref >59)
Globulin, Total: 2.4 g/dL (ref 1.5–4.5)
Glucose: 229 mg/dL — ABNORMAL HIGH (ref 65–99)
Potassium: 4.1 mmol/L (ref 3.5–5.2)
Sodium: 137 mmol/L (ref 134–144)
TOTAL PROTEIN: 6.8 g/dL (ref 6.0–8.5)

## 2015-08-26 LAB — LIPID PANEL
Chol/HDL Ratio: 5 ratio units — ABNORMAL HIGH (ref 0.0–4.4)
Cholesterol, Total: 203 mg/dL — ABNORMAL HIGH (ref 100–199)
HDL: 41 mg/dL (ref >39)
LDL CALC: 116 mg/dL — AB (ref 0–99)
Triglycerides: 232 mg/dL — ABNORMAL HIGH (ref 0–149)
VLDL CHOLESTEROL CAL: 46 mg/dL — AB (ref 5–40)

## 2015-08-28 NOTE — Addendum Note (Signed)
Addended by: Michaela Corner on: 08/28/2015 11:53 AM   Modules accepted: Orders

## 2015-09-24 ENCOUNTER — Encounter: Payer: Self-pay | Admitting: Family Medicine

## 2015-09-24 ENCOUNTER — Ambulatory Visit (INDEPENDENT_AMBULATORY_CARE_PROVIDER_SITE_OTHER): Payer: Medicare Other | Admitting: Family Medicine

## 2015-09-24 ENCOUNTER — Other Ambulatory Visit: Payer: Medicare Other

## 2015-09-24 VITALS — BP 110/73 | HR 104 | Temp 97.2°F | Ht 60.5 in | Wt 153.8 lb

## 2015-09-24 DIAGNOSIS — J069 Acute upper respiratory infection, unspecified: Secondary | ICD-10-CM | POA: Diagnosis not present

## 2015-09-24 DIAGNOSIS — R7303 Prediabetes: Secondary | ICD-10-CM

## 2015-09-24 LAB — GLUCOSE HEMOCUE WAIVED: GLU HEMOCUE WAIVED: 245 mg/dL — AB (ref 65–99)

## 2015-09-24 MED ORDER — FLUTICASONE PROPIONATE 50 MCG/ACT NA SUSP: 1.0000 | Freq: Two times a day (BID) | NASAL | Status: DC | PRN

## 2015-09-24 NOTE — Progress Notes (Signed)
BP 110/73 mmHg  Pulse 104  Temp(Src) 97.2 F (36.2 C) (Oral)  Ht 5' 0.5" (1.537 m)  Wt 153 lb 12.8 oz (69.763 kg)  BMI 29.53 kg/m2   Subjective:    Patient ID: Misty Cervantes, female    DOB: March 27, 1945, 70 y.o.   MRN: [id number]  HPI: Misty Cervantes is a 70 y.o. female presenting on 09/24/2015 for Cough   HPI Cough and congestion Patient comes in today because she's been having 2-3 days of cough and congestion and sinus drainage. She denies any fevers or chills or shortness of breath. She does feel like she started to get some in her upper chest. Her cough is productive of yellow-green sputum and then she's been having a lot of nasal drainage and congestion she denies any shortness of breath or wheezing. She has been using Delsym which been helping some.  Relevant past medical, surgical, family and social history reviewed and updated as indicated. Interim medical history since our last visit reviewed. Allergies and medications reviewed and updated.  Review of Systems  Constitutional: Negative for fever and chills.  HENT: Positive for postnasal drip, rhinorrhea, sinus pressure, sneezing and sore throat. Negative for congestion, ear discharge and ear pain.   Eyes: Negative for pain, redness and visual disturbance.  Respiratory: Negative for chest tightness and shortness of breath.   Cardiovascular: Negative for chest pain and leg swelling.  Genitourinary: Negative for dysuria and difficulty urinating.  Musculoskeletal: Negative for back pain and gait problem.  Skin: Negative for rash.  Neurological: Negative for light-headedness and headaches.  Psychiatric/Behavioral: Negative for behavioral problems and agitation.  All other systems reviewed and are negative.   Per HPI unless specifically indicated above     Medication List       This list is accurate as of: 09/24/15  2:53 PM.  Always use your most recent med list.               aspirin 81 MG chewable tablet  Chew  81 mg by mouth daily.     diclofenac 75 MG EC tablet  Commonly known as:  VOLTAREN  Take 75 mg by mouth 2 (two) times daily.     fluticasone 50 MCG/ACT nasal spray  Commonly known as:  FLONASE  Place 1 spray into both nostrils 2 (two) times daily as needed for allergies or rhinitis.     furosemide 20 MG tablet  Commonly known as:  LASIX  Take 1 tablet (20 mg total) by mouth daily.     HYDROcodone-acetaminophen 5-500 MG tablet  Commonly known as:  VICODIN  Take 1 tablet by mouth every 6 (six) hours as needed.     HYDROcodone-acetaminophen 5-325 MG tablet  Commonly known as:  NORCO/VICODIN  TAKE 1-2 TABLET BY MOUTH DAILY     multivitamin tablet  Take 1 tablet by mouth daily.     Potassium 95 MG Tbcr  Take 1 tablet by mouth daily.     pravastatin 40 MG tablet  Commonly known as:  PRAVACHOL  Take 1 tablet (40 mg total) by mouth daily.     triamterene-hydrochlorothiazide 37.5-25 MG capsule  Commonly known as:  DYAZIDE  TAKE 1 CAPSULE EACH BY MOUTH DAILY.           Objective:    BP 110/73 mmHg  Pulse 104  Temp(Src) 97.2 F (36.2 C) (Oral)  Ht 5' 0.5" (1.537 m)  Wt 153 lb 12.8 oz (69.763 kg)  BMI 29.53 kg/m2  Wt Readings from Last 3 Encounters:  09/24/15 153 lb 12.8 oz (69.763 kg)  08/25/15 156 lb (70.761 kg)  01/28/15 158 lb (71.668 kg)    Physical Exam  Constitutional: She is oriented to person, place, and time. She appears well-developed and well-nourished. No distress.  HENT:  Right Ear: Tympanic membrane, external ear and ear canal normal.  Left Ear: Tympanic membrane, external ear and ear canal normal.  Nose: Mucosal edema and rhinorrhea present. No epistaxis. Right sinus exhibits no maxillary sinus tenderness and no frontal sinus tenderness. Left sinus exhibits no maxillary sinus tenderness and no frontal sinus tenderness.  Mouth/Throat: Uvula is midline and mucous membranes are normal. Posterior oropharyngeal edema and posterior oropharyngeal erythema  present. No oropharyngeal exudate or tonsillar abscesses.  Eyes: Conjunctivae and EOM are normal.  Cardiovascular: Normal rate, regular rhythm, normal heart sounds and intact distal pulses.   No murmur heard. Pulmonary/Chest: Effort normal and breath sounds normal. No respiratory distress. She has no wheezes.  Musculoskeletal: Normal range of motion. She exhibits no edema or tenderness.  Neurological: She is alert and oriented to person, place, and time. Coordination normal.  Skin: Skin is warm and dry. No rash noted. She is not diaphoretic.  Psychiatric: She has a normal mood and affect. Her behavior is normal.  Vitals reviewed.     Assessment & Plan:   Problem List Items Addressed This Visit    None    Visit Diagnoses    Viral upper respiratory infection    -  Primary    Relevant Medications    fluticasone (FLONASE) 50 MCG/ACT nasal spray        Follow up plan: Return if symptoms worsen or fail to improve.  Counseling provided for all of the vaccine components No orders of the defined types were placed in this encounter.    Caryl Pina, MD [facility] 09/24/2015, 2:53 PM

## 2015-09-29 ENCOUNTER — Ambulatory Visit (INDEPENDENT_AMBULATORY_CARE_PROVIDER_SITE_OTHER): Payer: Medicare Other | Admitting: Family Medicine

## 2015-09-29 ENCOUNTER — Encounter: Payer: Self-pay | Admitting: Family Medicine

## 2015-09-29 VITALS — BP 139/90 | HR 91 | Temp 98.0°F | Ht 60.5 in | Wt 152.0 lb

## 2015-09-29 DIAGNOSIS — J209 Acute bronchitis, unspecified: Secondary | ICD-10-CM | POA: Diagnosis not present

## 2015-09-29 MED ORDER — AZITHROMYCIN 250 MG PO TABS: ORAL_TABLET | ORAL | Status: DC

## 2015-09-29 NOTE — Progress Notes (Signed)
BP 139/90 mmHg  Pulse 91  Temp(Src) 98 F (36.7 C) (Oral)  Ht 5' 0.5" (1.537 m)  Wt 152 lb (68.947 kg)  BMI 29.19 kg/m2   Subjective:    Patient ID: Misty Cervantes, female    DOB: October 15, 1945, 70 y.o.   MRN: [id number]  HPI: Misty Cervantes is a 70 y.o. female presenting on 09/29/2015 for Nasal Congestion and Cough   HPI Nasal congestion and cough and chest congestion Patient has been having nasal congestion and cough and chest congestion. She denies any fevers or chills. She denies any shortness of breath or wheezing. She does feel like she is starting get some rattling down in her chest. She has been trying to use Flonase and Mucinex without much success. She is also been using Allegra Delsym and even used one Alka-Seltzer. She was seen 5 days ago and has not seen any improvement.  Relevant past medical, surgical, family and social history reviewed and updated as indicated. Interim medical history since our last visit reviewed. Allergies and medications reviewed and updated.  Review of Systems  Constitutional: Negative for chills and fever.  HENT: Positive for congestion, postnasal drip, rhinorrhea, sinus pressure, sneezing and sore throat. Negative for ear discharge and ear pain.   Eyes: Negative for pain, redness and visual disturbance.  Respiratory: Positive for cough. Negative for chest tightness, shortness of breath and wheezing.   Cardiovascular: Negative for chest pain and leg swelling.  Genitourinary: Negative for difficulty urinating and dysuria.  Musculoskeletal: Negative for back pain and gait problem.  Skin: Negative for rash.  Neurological: Negative for light-headedness and headaches.  Psychiatric/Behavioral: Negative for agitation and behavioral problems.  All other systems reviewed and are negative.   Per HPI unless specifically indicated above     Medication List       This list is accurate as of: 09/29/15  6:37 PM.  Always use your most recent med list.                aspirin 81 MG chewable tablet  Chew 81 mg by mouth daily.     azithromycin 250 MG tablet  Commonly known as:  ZITHROMAX  Take 2 the first day and then one each day after.     diclofenac 75 MG EC tablet  Commonly known as:  VOLTAREN  Take 75 mg by mouth 2 (two) times daily.     fluticasone 50 MCG/ACT nasal spray  Commonly known as:  FLONASE  Place 1 spray into both nostrils 2 (two) times daily as needed for allergies or rhinitis.     furosemide 20 MG tablet  Commonly known as:  LASIX  Take 1 tablet (20 mg total) by mouth daily.     HYDROcodone-acetaminophen 5-500 MG tablet  Commonly known as:  VICODIN  Take 1 tablet by mouth every 6 (six) hours as needed.     HYDROcodone-acetaminophen 5-325 MG tablet  Commonly known as:  NORCO/VICODIN  TAKE 1-2 TABLET BY MOUTH DAILY     multivitamin tablet  Take 1 tablet by mouth daily.     Potassium 95 MG Tbcr  Take 1 tablet by mouth daily.     pravastatin 40 MG tablet  Commonly known as:  PRAVACHOL  Take 1 tablet (40 mg total) by mouth daily.     triamterene-hydrochlorothiazide 37.5-25 MG capsule  Commonly known as:  DYAZIDE  TAKE 1 CAPSULE EACH BY MOUTH DAILY.           Objective:  BP 139/90 mmHg  Pulse 91  Temp(Src) 98 F (36.7 C) (Oral)  Ht 5' 0.5" (1.537 m)  Wt 152 lb (68.947 kg)  BMI 29.19 kg/m2  Wt Readings from Last 3 Encounters:  09/29/15 152 lb (68.947 kg)  09/24/15 153 lb 12.8 oz (69.763 kg)  08/25/15 156 lb (70.761 kg)    Physical Exam  Constitutional: She is oriented to person, place, and time. She appears well-developed and well-nourished. No distress.  HENT:  Right Ear: Tympanic membrane, external ear and ear canal normal.  Left Ear: Tympanic membrane, external ear and ear canal normal.  Nose: Mucosal edema and rhinorrhea present. No epistaxis. Right sinus exhibits no maxillary sinus tenderness and no frontal sinus tenderness. Left sinus exhibits no maxillary sinus tenderness and no  frontal sinus tenderness.  Mouth/Throat: Uvula is midline and mucous membranes are normal. Posterior oropharyngeal edema and posterior oropharyngeal erythema present. No oropharyngeal exudate or tonsillar abscesses.  Eyes: Conjunctivae and EOM are normal.  Cardiovascular: Normal rate, regular rhythm, normal heart sounds and intact distal pulses.   No murmur heard. Pulmonary/Chest: Effort normal and breath sounds normal. No respiratory distress. She has no wheezes.  Musculoskeletal: Normal range of motion. She exhibits no edema or tenderness.  Neurological: She is alert and oriented to person, place, and time. Coordination normal.  Skin: Skin is warm and dry. No rash noted. She is not diaphoretic.  Psychiatric: She has a normal mood and affect. Her behavior is normal.  Vitals reviewed.     Assessment & Plan:       Problem List Items Addressed This Visit    None    Visit Diagnoses    Acute bronchitis, unspecified organism    -  Primary    Relevant Medications    azithromycin (ZITHROMAX) 250 MG tablet        Follow up plan: Return if symptoms worsen or fail to improve.  Counseling provided for all of the vaccine components No orders of the defined types were placed in this encounter.    Caryl Pina, MD [facility] 09/29/2015, 6:37 PM

## 2015-10-17 ENCOUNTER — Ambulatory Visit: Payer: Self-pay | Admitting: Orthopedic Surgery

## 2015-10-22 ENCOUNTER — Telehealth: Payer: Self-pay | Admitting: Family Medicine

## 2015-10-22 NOTE — Telephone Encounter (Signed)
Patient saw you on 6-12 for surgical clearance and wants to know if you need to see her again because now she needs a clearance letter. Surgery is scheduled for 8-21. Also wants to know if labwork was reviewed that she had rechecked and what she should do next. Please advise

## 2015-10-23 ENCOUNTER — Other Ambulatory Visit: Payer: Self-pay | Admitting: Family Medicine

## 2015-10-24 NOTE — Telephone Encounter (Signed)
My exam done in June is sufficient for me to clear.  The most recent labs done on 8-4 are not visible to me

## 2015-10-24 NOTE — Telephone Encounter (Signed)
Patient aware and will have [place] office fax form

## 2015-10-27 ENCOUNTER — Encounter (HOSPITAL_COMMUNITY): Payer: Self-pay | Admitting: *Deleted

## 2015-10-27 ENCOUNTER — Encounter (HOSPITAL_COMMUNITY)
Admission: RE | Admit: 2015-10-27 | Discharge: 2015-10-27 | Disposition: A | Discharge status: Home / Self Care | Payer: Medicare Other | Source: Ambulatory Visit | Attending: Orthopedic Surgery | Admitting: Orthopedic Surgery

## 2015-10-27 DIAGNOSIS — M1711 Unilateral primary osteoarthritis, right knee: Secondary | ICD-10-CM | POA: Diagnosis not present

## 2015-10-27 DIAGNOSIS — I10 Essential (primary) hypertension: Secondary | ICD-10-CM | POA: Insufficient documentation

## 2015-10-27 DIAGNOSIS — Z01818 Encounter for other preprocedural examination: Secondary | ICD-10-CM | POA: Insufficient documentation

## 2015-10-27 DIAGNOSIS — R9431 Abnormal electrocardiogram [ECG] [EKG]: Secondary | ICD-10-CM | POA: Diagnosis not present

## 2015-10-27 DIAGNOSIS — I440 Atrioventricular block, first degree: Secondary | ICD-10-CM | POA: Insufficient documentation

## 2015-10-27 DIAGNOSIS — Z0182 Encounter for allergy testing: Secondary | ICD-10-CM | POA: Insufficient documentation

## 2015-10-27 DIAGNOSIS — Z0183 Encounter for blood typing: Secondary | ICD-10-CM | POA: Insufficient documentation

## 2015-10-27 HISTORY — DX: Asymptomatic menopausal state: Z78.0

## 2015-10-27 HISTORY — DX: Measles without complication: B05.9

## 2015-10-27 HISTORY — DX: Essential (primary) hypertension: I10

## 2015-10-27 LAB — SURGICAL PCR SCREEN
MRSA, PCR: NEGATIVE
STAPHYLOCOCCUS AUREUS: NEGATIVE

## 2015-10-27 LAB — CBC
HEMATOCRIT: 38.8 % (ref 36.0–46.0)
Hemoglobin: 12.9 g/dL (ref 12.0–15.0)
MCH: 31.2 pg (ref 26.0–34.0)
MCHC: 33.2 g/dL (ref 30.0–36.0)
MCV: 93.9 fL (ref 78.0–100.0)
Platelets: 211 10*3/uL (ref 150–400)
RBC: 4.13 MIL/uL (ref 3.87–5.11)
RDW: 12.7 % (ref 11.5–15.5)
WBC: 4.6 10*3/uL (ref 4.0–10.5)

## 2015-10-27 LAB — URINALYSIS, ROUTINE W REFLEX MICROSCOPIC
Bilirubin Urine: NEGATIVE
Glucose, UA: >1000 mg/dL — AB
Hgb urine dipstick: NEGATIVE
Ketones, ur: NEGATIVE mg/dL
NITRITE: NEGATIVE
PH: 7 (ref 5.0–8.0)
Protein, ur: NEGATIVE mg/dL
SPECIFIC GRAVITY, URINE: 1.016 (ref 1.005–1.030)

## 2015-10-27 LAB — COMPREHENSIVE METABOLIC PANEL
ALT: 47 U/L (ref 14–54)
AST: 38 U/L (ref 15–41)
Albumin: 4.4 g/dL (ref 3.5–5.0)
Alkaline Phosphatase: 79 U/L (ref 38–126)
Anion gap: 8 (ref 5–15)
BILIRUBIN TOTAL: 0.4 mg/dL (ref 0.3–1.2)
BUN: 22 mg/dL — AB (ref 6–20)
CO2: 26 mmol/L (ref 22–32)
Calcium: 9.4 mg/dL (ref 8.9–10.3)
Chloride: 98 mmol/L — ABNORMAL LOW (ref 101–111)
Creatinine, Ser: 1.12 mg/dL — ABNORMAL HIGH (ref 0.44–1.00)
GFR, EST AFRICAN AMERICAN: 56 mL/min — AB (ref >60)
GFR, EST NON AFRICAN AMERICAN: 49 mL/min — AB (ref >60)
Glucose, Bld: 342 mg/dL — ABNORMAL HIGH (ref 65–99)
POTASSIUM: 3.4 mmol/L — AB (ref 3.5–5.1)
Sodium: 132 mmol/L — ABNORMAL LOW (ref 135–145)
TOTAL PROTEIN: 7.2 g/dL (ref 6.5–8.1)

## 2015-10-27 LAB — URINE MICROSCOPIC-ADD ON

## 2015-10-27 LAB — APTT: aPTT: 27 seconds (ref 24–36)

## 2015-10-27 LAB — PROTIME-INR
INR: 0.98
PROTHROMBIN TIME: 13 s (ref 11.4–15.2)

## 2015-10-27 NOTE — Progress Notes (Addendum)
CMP, urinalysis and micro results/epic per PAT visit 10/27/2015 sent to Dr Wynelle Link

## 2015-10-27 NOTE — Patient Instructions (Signed)
Misty Cervantes  10/27/2015   Your procedure is scheduled on: Monday November 03, 2015  Report to [facility] Main  Entrance take [place]  elevators to 3rd floor to  [place] at 6:15 AM.  Call this number if you have problems the morning of surgery [phone]   Remember: ONLY 1 PERSON MAY GO WITH YOU TO SHORT STAY TO GET  READY MORNING OF [place].  Do not eat food or drink liquids :After Midnight.     Take these medicines the morning of surgery with A SIP OF WATER: Hydrocodone-Acetaminophen if needed                                You may not have any metal on your body including hair pins and              piercings  Do not wear jewelry, make-up, lotions, powders or perfumes, deodorant             Do not wear nail polish.  Do not shave  48 hours prior to surgery.               Do not bring valuables to the hospital. [place].  Contacts, dentures or bridgework may not be worn into surgery.  Leave suitcase in the car. After surgery it may be brought to your room.      Special Instructions: NO SMOKING 24 HOURS PRIOR TO SURGICAL PROCEDURE DATE               Please read over the following fact sheets you were given: MRSA INFORMATION SHEET; INCENTIVE SPIROMETER; BLOOD TRANSFUSION INFORMATION SHEET  _____________________________________________________________________             [facility] - Preparing for Surgery Before surgery, you can play an important role.  Because skin is not sterile, your skin needs to be as free of germs as possible.  You can reduce the number of germs on your skin by washing with CHG (chlorahexidine gluconate) soap before surgery.  CHG is an antiseptic cleaner which kills germs and bonds with the skin to continue killing germs even after washing. Please DO NOT use if you have an allergy to CHG or antibacterial soaps.  If your skin becomes reddened/irritated stop using the CHG and  inform your nurse when you arrive at Short Stay. Do not shave (including legs and underarms) for at least 48 hours prior to the first CHG shower.  You may shave your face/neck. Please follow these instructions carefully:  1.  Shower with CHG Soap the night before surgery and the  morning of Surgery.  2.  If you choose to wash your hair, wash your hair first as usual with your  normal  shampoo.  3.  After you shampoo, rinse your hair and body thoroughly to remove the  shampoo.                           4.  Use CHG as you would any other liquid soap.  You can apply chg directly  to the skin and wash  Gently with a scrungie or clean washcloth.  5.  Apply the CHG Soap to your body ONLY FROM THE NECK DOWN.   Do not use on face/ open                           Wound or open sores. Avoid contact with eyes, ears mouth and genitals (private parts).                       Wash face,  Genitals (private parts) with your normal soap.             6.  Wash thoroughly, paying special attention to the area where your surgery  will be performed.  7.  Thoroughly rinse your body with warm water from the neck down.  8.  DO NOT shower/wash with your normal soap after using and rinsing off  the CHG Soap.                9.  Pat yourself dry with a clean towel.            10.  Wear clean pajamas.            11.  Place clean sheets on your bed the night of your first shower and do not  sleep with pets. Day of Surgery : Do not apply any lotions/deodorants the morning of surgery.  Please wear clean clothes to the hospital/surgery center.  FAILURE TO FOLLOW THESE INSTRUCTIONS MAY RESULT IN THE CANCELLATION OF YOUR SURGERY PATIENT SIGNATURE_________________________________  NURSE SIGNATURE__________________________________  ________________________________________________________________________   Adam Phenix  An incentive spirometer is a tool that can help keep your lungs clear and  active. This tool measures how well you are filling your lungs with each breath. Taking long deep breaths may help reverse or decrease the chance of developing breathing (pulmonary) problems (especially infection) following:  A long period of time when you are unable to move or be active. BEFORE THE PROCEDURE   If the spirometer includes an indicator to show your best effort, your nurse or respiratory therapist will set it to a desired goal.  If possible, sit up straight or lean slightly forward. Try not to slouch.  Hold the incentive spirometer in an upright position. INSTRUCTIONS FOR USE  1. Sit on the edge of your bed if possible, or sit up as far as you can in bed or on a chair. 2. Hold the incentive spirometer in an upright position. 3. Breathe out normally. 4. Place the mouthpiece in your mouth and seal your lips tightly around it. 5. Breathe in slowly and as deeply as possible, raising the piston or the ball toward the top of the column. 6. Hold your breath for 3-5 seconds or for as long as possible. Allow the piston or ball to fall to the bottom of the column. 7. Remove the mouthpiece from your mouth and breathe out normally. 8. Rest for a few seconds and repeat Steps 1 through 7 at least 10 times every 1-2 hours when you are awake. Take your time and take a few normal breaths between deep breaths. 9. The spirometer may include an indicator to show your best effort. Use the indicator as a goal to work toward during each repetition. 10. After each set of 10 deep breaths, practice coughing to be sure your lungs are clear. If you have an incision (the cut made at the time of surgery),  support your incision when coughing by placing a pillow or rolled up towels firmly against it. Once you are able to get out of bed, walk around indoors and cough well. You may stop using the incentive spirometer when instructed by your caregiver.  RISKS AND COMPLICATIONS  Take your time so you do not get  dizzy or light-headed.  If you are in pain, you may need to take or ask for pain medication before doing incentive spirometry. It is harder to take a deep breath if you are having pain. AFTER USE  Rest and breathe slowly and easily.  It can be helpful to keep track of a log of your progress. Your caregiver can provide you with a simple table to help with this. If you are using the spirometer at home, follow these instructions: [place] IF:   You are having difficultly using the spirometer.  You have trouble using the spirometer as often as instructed.  Your pain medication is not giving enough relief while using the spirometer.  You develop fever of 100.5 F (38.1 C) or higher. SEEK IMMEDIATE MEDICAL CARE IF:   You cough up bloody sputum that had not been present before.  You develop fever of 102 F (38.9 C) or greater.  You develop worsening pain at or near the incision site. MAKE SURE YOU:   Understand these instructions.  Will watch your condition.  Will get help right away if you are not doing well or get worse. Document Released: 07/12/2006 Document Revised: 05/24/2011 Document Reviewed: 09/12/2006 Exit[employer] Patient Information 2014 [employer], [place].   ________________________________________________________________________  WHAT IS A BLOOD TRANSFUSION? Blood Transfusion Information  A transfusion is the replacement of blood or some of its parts. Blood is made up of multiple cells which provide different functions.  Red blood cells carry oxygen and are used for blood loss replacement.  White blood cells fight against infection.  Platelets control bleeding.  Plasma helps clot blood.  Other blood products are available for specialized needs, such as hemophilia or other clotting disorders. BEFORE THE TRANSFUSION  Who gives blood for transfusions?   Healthy volunteers who are fully evaluated to make sure their blood is safe. This is blood bank  blood. Transfusion therapy is the safest it has ever been in the practice of medicine. Before blood is taken from a donor, a complete history is taken to make sure that person has no history of diseases nor engages in risky social behavior (examples are intravenous drug use or sexual activity with multiple partners). The donor's travel history is screened to minimize risk of transmitting infections, such as malaria. The donated blood is tested for signs of infectious diseases, such as HIV and hepatitis. The blood is then tested to be sure it is compatible with you in order to minimize the chance of a transfusion reaction. If you or a relative donates blood, this is often done in anticipation of surgery and is not appropriate for emergency situations. It takes many days to process the donated blood. RISKS AND COMPLICATIONS Although transfusion therapy is very safe and saves many lives, the main dangers of transfusion include:   Getting an infectious disease.  Developing a transfusion reaction. This is an allergic reaction to something in the blood you were given. Every precaution is taken to prevent this. The decision to have a blood transfusion has been considered carefully by your caregiver before blood is given. Blood is not given unless the benefits outweigh the risks. AFTER THE TRANSFUSION  Right after receiving a blood transfusion, you will usually feel much better and more energetic. This is especially true if your red blood cells have gotten low (anemic). The transfusion raises the level of the red blood cells which carry oxygen, and this usually causes an energy increase.  The nurse administering the transfusion will monitor you carefully for complications. HOME CARE INSTRUCTIONS  No special instructions are needed after a transfusion. You may find your energy is better. Speak with your caregiver about any limitations on activity for underlying diseases you may have. SEEK MEDICAL CARE IF:    Your condition is not improving after your transfusion.  You develop redness or irritation at the intravenous (IV) site. SEEK IMMEDIATE MEDICAL CARE IF:  Any of the following symptoms occur over the next 12 hours:  Shaking chills.  You have a temperature by mouth above 102 F (38.9 C), not controlled by medicine.  Chest, back, or muscle pain.  People around you feel you are not acting correctly or are confused.  Shortness of breath or difficulty breathing.  Dizziness and fainting.  You get a rash or develop hives.  You have a decrease in urine output.  Your urine turns a dark color or changes to pink, red, or brown. Any of the following symptoms occur over the next 10 days:  You have a temperature by mouth above 102 F (38.9 C), not controlled by medicine.  Shortness of breath.  Weakness after normal activity.  The white part of the eye turns yellow (jaundice).  You have a decrease in the amount of urine or are urinating less often.  Your urine turns a dark color or changes to pink, red, or brown. Document Released: 02/27/2000 Document Revised: 05/24/2011 Document Reviewed: 10/16/2007 [facility] Patient Information 2014 [place], [place].  _______________________________________________________________________

## 2015-10-29 NOTE — Progress Notes (Signed)
Spoke with [employer] Mc[employer]ride/scheduler with Dr Wynelle Link in regards to lab results from 10/27/2015 that were faxed to surgeon. Velvet stated she would make sure they received and review.

## 2015-11-02 ENCOUNTER — Ambulatory Visit: Payer: Self-pay | Admitting: Orthopedic Surgery

## 2015-11-02 NOTE — H&P (Signed)
Misty Cervantes DOB: 06/16/1945 Divorced / Language: Cleophus Molt / Race: White Female Date of Admission:  11/03/2015 CC:  Right Knee Pain History of Present Illness  The patient is a 70 year old female who comes in for a preoperative History and Physical. The patient is scheduled for a right total knee arthroplasty to be performed by Dr. Dione Plover. Aluisio, MD at [facility] on 11/03/2015. The patient is a 70 year old female who presented for follow up of their knee. The patient is being followed for their right osteoarthritis. They are now 1 1/2 years out from Synvisc series (4 weeks s/p new injury). Symptoms reported include: pain, aching and difficulty ambulating. The patient feels that they are doing poorly and report their pain level to be moderate. Current treatment includes: home exercise program, bracing (economy hinge), NSAIDs and icing. The following medication has been used for pain control: Hydrocodone (needs refill; she normally only takes it 2-3 times per day). The patient has reported improvement of their symptoms with: bracing and ice. She is scheduled for TKA on 11/03/15. She is ready to proceed with knee replacement. I have previously replaced her hip and she did beautifully with that. She was not really familiar with the knee replacement. Knee has gotten progressively worse over time. Injections are no longer beneficial. They have been treated conservatively in the past for the above stated problem and despite conservative measures, they continue to have progressive pain and severe functional limitations and dysfunction. They have failed non-operative management including home exercise, medications, and injections. It is felt that they would benefit from undergoing total joint replacement. Risks and benefits of the procedure have been discussed with the patient and they elect to proceed with surgery. There are no active contraindications to surgery such as ongoing infection or rapidly  progressive neurological disease.  Problem List/Past Medical S/P Left total hip arthroplasty (V43.64)  Primary osteoarthritis of one knee, right (M17.11)  NSAID long-term use (Z79.1)  High blood pressure  Migraine Headache  Osteoarthritis  Bronchitis  Pneumonia  Past History Hypercholesterolemia  Urinary Tract Infection  Menopause  Measles  Cataract  Allergies Probiotic & Acidophilus Ex St *ANTIDIARRHEALS*   Family History  Cancer  father and grandfather mothers side Congestive Heart Failure  mother Diabetes Mellitus  mother, sister and child Drug / Alcohol Addiction  child Heart disease in female family member before age 53  Kidney disease  father Liver Disease, Chronic  sister  Social History Alcohol use  never consumed alcohol Children  2 Current work status  retired Engineer, agricultural (Currently)  no Exercise  Exercises daily; does running / walking Illicit drug use  no Living situation  live alone Marital status  divorced Number of flights of stairs before winded  2-3 Previously in rehab  no Tobacco / smoke exposure  no Tobacco use  Never smoker. never smoker  Medication History  Aspirin (81MG  Tablet, 1 Oral) Active. Diclofenac Sodium (75MG  Tablet DR, 1 (one) Oral two times daily, Taken starting 09/18/2015) Active. Flonase Active. Furosemide (20MG  Tablet, Oral) Active. Norco (5-325MG  Tablet, 1-2 Oral po qd, Taken starting 10/14/2015) Active. Multiple Vitamin (1 Oral) Active. (Centrum Silver) Potassium Chloride (Oral) Specific strength unknown - Active. Pravastatin Sodium (40MG  Tablet, Oral) Active. Triamterene-HCTZ (37.5-25MG  Capsule, Oral) Active. Omeprazole (20MG  Capsule DR, Oral) Active.  Past Surgical History Carpal Tunnel Repair  right Cataract Surgery  bilateral Total Hip Replacement  Date: 02/2010. left   Review of Systems  General Not Present- Chills, Fatigue, Fever,  Memory Loss, Night Sweats,  Weight Gain and Weight Loss. Skin Not Present- Eczema, Hives, Itching, Lesions and Rash. HEENT Not Present- Dentures, Double Vision, Headache, Hearing Loss, Tinnitus and Visual Loss. Respiratory Not Present- Allergies, Chronic Cough, Coughing up blood, Shortness of breath at rest and Shortness of breath with exertion. Cardiovascular Not Present- Chest Pain, Difficulty Breathing Lying Down, Murmur, Palpitations, Racing/skipping heartbeats and Swelling. Gastrointestinal Not Present- Abdominal Pain, Bloody Stool, Constipation, Diarrhea, Difficulty Swallowing, Heartburn, Jaundice, Loss of appetitie, Nausea and Vomiting. Female Genitourinary Not Present- Blood in Urine, Discharge, Flank Pain, Incontinence, Painful Urination, Urgency, Urinary frequency, Urinary Retention, Urinating at Night and Weak urinary stream. Musculoskeletal Present- Joint Pain, Joint Swelling and Morning Stiffness. Not Present- Back Pain, Muscle Pain, Muscle Weakness and Spasms. Neurological Not Present- Blackout spells, Difficulty with balance, Dizziness, Paralysis, Tremor and Weakness. Psychiatric Not Present- Insomnia.  Vitals Weight: 152 lb Height: 60in Weight was reported by patient. Height was reported by patient. Body Surface Area: 1.66 m Body Mass Index: 29.69 kg/m  Pulse: 84 (Regular)  BP: 138/76 (Sitting, Left Arm, Standard)   Physical Exam General Mental Status -Alert, cooperative and good historian. General Appearance-pleasant, Not in acute distress. Orientation-Oriented X3. Build & Nutrition-Well nourished and Well developed.  Head and Neck Head-normocephalic, atraumatic . Neck Global Assessment - supple, no bruit auscultated on the right, no bruit auscultated on the left.  Eye Pupil - Bilateral-Regular and Round. Motion - Bilateral-EOMI.  Chest and Lung Exam Auscultation Breath sounds - clear at anterior chest wall and clear at posterior chest wall. Adventitious sounds -  No Adventitious sounds.  Cardiovascular Auscultation Rhythm - Regular rate and rhythm. Heart Sounds - S1 WNL and S2 WNL. Murmurs & Other Heart Sounds - Auscultation of the heart reveals - No Murmurs.  Abdomen Palpation/Percussion Tenderness - Abdomen is non-tender to palpation. Rigidity (guarding) - Abdomen is soft. Auscultation Auscultation of the abdomen reveals - Bowel sounds normal.  Female Genitourinary Note: Not done, not pertinent to present illness   Musculoskeletal Note: She is alert and oriented in no apparent distress. Her right hip has excellent range of motion with no discomfort. Her right knee shows no effusion. Her range is about 5 to 125. She has got a valgus deformity. She is tender lateral greater than medial and she has got some slight laxity to varus and valgus stressing. Pulse, sensation and motor are intact. Left lower extremity exam is unremarkable.  RADIOGRAPHS Reviewed AP and lateral of the right knee showing valgus deformity with bone on bone change in the lateral and patellofemoral compartments.   Assessment & Plan  Primary osteoarthritis of one knee, right (M17.11)  Note:Surgical Plans: Right Total Knee Replacement  Disposition: Home with Son  PCP: Dr. Sabra Heck, [facility]  IV TXA  Anesthesia Issues: None  Veritas Study Patient Traditional PT  Signed electronically by Ok Edwards, III PA-C

## 2015-11-03 ENCOUNTER — Inpatient Hospital Stay (HOSPITAL_COMMUNITY): Payer: Medicare Other | Admitting: Certified Registered"

## 2015-11-03 ENCOUNTER — Encounter (HOSPITAL_COMMUNITY)
Admission: RE | Disposition: A | Discharge status: Home Health | Payer: Self-pay | Source: Ambulatory Visit | Attending: Orthopedic Surgery

## 2015-11-03 ENCOUNTER — Encounter (HOSPITAL_COMMUNITY): Payer: Self-pay | Admitting: Certified Registered"

## 2015-11-03 ENCOUNTER — Inpatient Hospital Stay (HOSPITAL_COMMUNITY)
Admission: RE | Admit: 2015-11-03 | Discharge: 2015-11-06 | DRG: 470 | Disposition: A | Discharge status: Home Health | Payer: Medicare Other | Source: Ambulatory Visit | Attending: Orthopedic Surgery | Admitting: Orthopedic Surgery

## 2015-11-03 DIAGNOSIS — E7800 Pure hypercholesterolemia, unspecified: Secondary | ICD-10-CM | POA: Diagnosis present

## 2015-11-03 DIAGNOSIS — Z7951 Long term (current) use of inhaled steroids: Secondary | ICD-10-CM | POA: Diagnosis not present

## 2015-11-03 DIAGNOSIS — Z87440 Personal history of urinary (tract) infections: Secondary | ICD-10-CM | POA: Diagnosis not present

## 2015-11-03 DIAGNOSIS — Z888 Allergy status to other drugs, medicaments and biological substances status: Secondary | ICD-10-CM | POA: Diagnosis not present

## 2015-11-03 DIAGNOSIS — Z79899 Other long term (current) drug therapy: Secondary | ICD-10-CM

## 2015-11-03 DIAGNOSIS — Z8619 Personal history of other infectious and parasitic diseases: Secondary | ICD-10-CM | POA: Diagnosis not present

## 2015-11-03 DIAGNOSIS — M25561 Pain in right knee: Secondary | ICD-10-CM | POA: Diagnosis present

## 2015-11-03 DIAGNOSIS — Z96642 Presence of left artificial hip joint: Secondary | ICD-10-CM | POA: Diagnosis present

## 2015-11-03 DIAGNOSIS — M25761 Osteophyte, right knee: Secondary | ICD-10-CM | POA: Diagnosis present

## 2015-11-03 DIAGNOSIS — M1711 Unilateral primary osteoarthritis, right knee: Secondary | ICD-10-CM | POA: Diagnosis present

## 2015-11-03 DIAGNOSIS — H269 Unspecified cataract: Secondary | ICD-10-CM | POA: Diagnosis present

## 2015-11-03 DIAGNOSIS — I10 Essential (primary) hypertension: Secondary | ICD-10-CM | POA: Diagnosis present

## 2015-11-03 DIAGNOSIS — Z8701 Personal history of pneumonia (recurrent): Secondary | ICD-10-CM | POA: Diagnosis not present

## 2015-11-03 DIAGNOSIS — Z7982 Long term (current) use of aspirin: Secondary | ICD-10-CM | POA: Diagnosis not present

## 2015-11-03 DIAGNOSIS — M21061 Valgus deformity, not elsewhere classified, right knee: Secondary | ICD-10-CM | POA: Diagnosis present

## 2015-11-03 DIAGNOSIS — M1710 Unilateral primary osteoarthritis, unspecified knee: Secondary | ICD-10-CM | POA: Diagnosis present

## 2015-11-03 DIAGNOSIS — Z8249 Family history of ischemic heart disease and other diseases of the circulatory system: Secondary | ICD-10-CM

## 2015-11-03 DIAGNOSIS — Z8711 Personal history of peptic ulcer disease: Secondary | ICD-10-CM | POA: Diagnosis not present

## 2015-11-03 DIAGNOSIS — M179 Osteoarthritis of knee, unspecified: Secondary | ICD-10-CM | POA: Diagnosis present

## 2015-11-03 HISTORY — PX: TOTAL KNEE ARTHROPLASTY: SHX125

## 2015-11-03 LAB — TYPE AND SCREEN
ABO/RH(D): O POS
ANTIBODY SCREEN: NEGATIVE

## 2015-11-03 LAB — GLUCOSE, CAPILLARY: GLUCOSE-CAPILLARY: 231 mg/dL — AB (ref 65–99)

## 2015-11-03 SURGERY — ARTHROPLASTY, KNEE, TOTAL
Anesthesia: Spinal | Site: Knee | Laterality: Right

## 2015-11-03 MED ORDER — MIDAZOLAM HCL 2 MG/2ML IJ SOLN
INTRAMUSCULAR | Status: AC
  Filled 2015-11-03: qty 2

## 2015-11-03 MED ORDER — TRIAMTERENE-HCTZ 37.5-25 MG PO CAPS
1.0000 | ORAL_CAPSULE | Freq: Every day | ORAL | Status: DC
  Administered 2015-11-04 – 2015-11-05 (×2): 1 via ORAL
  Filled 2015-11-03 (×3): qty 1

## 2015-11-03 MED ORDER — ACETAMINOPHEN 10 MG/ML IV SOLN
INTRAVENOUS | Status: AC
  Filled 2015-11-03: qty 100

## 2015-11-03 MED ORDER — FENTANYL CITRATE (PF) 100 MCG/2ML IJ SOLN
INTRAMUSCULAR | Status: AC
  Filled 2015-11-03: qty 2

## 2015-11-03 MED ORDER — LACTATED RINGERS IV SOLN
INTRAVENOUS | Status: DC
  Administered 2015-11-03: 11:00:00 via INTRAVENOUS

## 2015-11-03 MED ORDER — LACTATED RINGERS IV SOLN: INTRAVENOUS | Status: DC

## 2015-11-03 MED ORDER — CHLORHEXIDINE GLUCONATE 4 % EX LIQD
60.0000 mL | Freq: Once | CUTANEOUS | Status: DC
Start: 2015-11-03 — End: 2015-11-03

## 2015-11-03 MED ORDER — ONDANSETRON HCL 4 MG/2ML IJ SOLN
INTRAMUSCULAR | Status: DC | PRN
  Administered 2015-11-03: 4 mg via INTRAVENOUS

## 2015-11-03 MED ORDER — ACETAMINOPHEN 10 MG/ML IV SOLN
1000.0000 mg | Freq: Once | INTRAVENOUS | Status: DC
  Filled 2015-11-03: qty 100

## 2015-11-03 MED ORDER — PHENYLEPHRINE HCL 10 MG/ML IJ SOLN
INTRAMUSCULAR | Status: DC | PRN
  Administered 2015-11-03 (×3): 80 ug via INTRAVENOUS

## 2015-11-03 MED ORDER — ACETAMINOPHEN 10 MG/ML IV SOLN
1000.0000 mg | Freq: Once | INTRAVENOUS | Status: AC
  Administered 2015-11-03: 1000 mg via INTRAVENOUS
  Filled 2015-11-03: qty 100

## 2015-11-03 MED ORDER — SODIUM CHLORIDE 0.9 % IV SOLN
1000.0000 mg | INTRAVENOUS | Status: AC
  Administered 2015-11-03: 1000 mg via INTRAVENOUS
  Filled 2015-11-03: qty 10

## 2015-11-03 MED ORDER — METOCLOPRAMIDE HCL 5 MG/ML IJ SOLN
5.0000 mg | Freq: Three times a day (TID) | INTRAMUSCULAR | Status: DC | PRN
Start: 2015-11-03 — End: 2015-11-06

## 2015-11-03 MED ORDER — FLEET ENEMA 7-19 GM/118ML RE ENEM: 1.0000 | ENEMA | Freq: Once | RECTAL | Status: DC | PRN

## 2015-11-03 MED ORDER — TRAMADOL HCL 50 MG PO TABS: 50.0000 mg | ORAL_TABLET | Freq: Four times a day (QID) | ORAL | Status: DC | PRN

## 2015-11-03 MED ORDER — SODIUM CHLORIDE 0.9 % IV SOLN
INTRAVENOUS | Status: DC
  Administered 2015-11-03: 13:00:00 via INTRAVENOUS

## 2015-11-03 MED ORDER — LACTATED RINGERS IV SOLN
INTRAVENOUS | Status: DC | PRN
  Administered 2015-11-03 (×2): via INTRAVENOUS

## 2015-11-03 MED ORDER — BUPIVACAINE HCL 0.25 % IJ SOLN
INTRAMUSCULAR | Status: DC | PRN
  Administered 2015-11-03: 30 mL

## 2015-11-03 MED ORDER — POLYETHYLENE GLYCOL 3350 17 G PO PACK: 17.0000 g | PACK | Freq: Every day | ORAL | Status: DC | PRN

## 2015-11-03 MED ORDER — SODIUM CHLORIDE 0.9 % IJ SOLN
INTRAMUSCULAR | Status: AC
  Filled 2015-11-03: qty 50

## 2015-11-03 MED ORDER — BUPIVACAINE LIPOSOME 1.3 % IJ SUSP
20.0000 mL | Freq: Once | INTRAMUSCULAR | Status: DC
  Filled 2015-11-03: qty 20

## 2015-11-03 MED ORDER — TRANEXAMIC ACID 1000 MG/10ML IV SOLN
1000.0000 mg | Freq: Once | INTRAVENOUS | Status: AC
  Administered 2015-11-03: 1000 mg via INTRAVENOUS
  Filled 2015-11-03: qty 1100

## 2015-11-03 MED ORDER — CEFAZOLIN SODIUM-DEXTROSE 2-4 GM/100ML-% IV SOLN
2.0000 g | Freq: Four times a day (QID) | INTRAVENOUS | Status: AC
  Administered 2015-11-03 (×2): 2 g via INTRAVENOUS
  Filled 2015-11-03 (×2): qty 100

## 2015-11-03 MED ORDER — BISACODYL 10 MG RE SUPP: 10.0000 mg | Freq: Every day | RECTAL | Status: DC | PRN

## 2015-11-03 MED ORDER — FENTANYL CITRATE (PF) 100 MCG/2ML IJ SOLN
INTRAMUSCULAR | Status: DC | PRN
  Administered 2015-11-03: 100 ug via INTRAVENOUS

## 2015-11-03 MED ORDER — PHENOL 1.4 % MT LIQD
1.0000 | OROMUCOSAL | Status: DC | PRN
  Filled 2015-11-03: qty 177

## 2015-11-03 MED ORDER — DEXTROSE 5 % IV SOLN
500.0000 mg | Freq: Four times a day (QID) | INTRAVENOUS | Status: DC | PRN
  Administered 2015-11-03: 500 mg via INTRAVENOUS
  Filled 2015-11-03: qty 5
  Filled 2015-11-03: qty 550

## 2015-11-03 MED ORDER — OXYCODONE HCL 5 MG PO TABS
5.0000 mg | ORAL_TABLET | ORAL | Status: DC | PRN
  Administered 2015-11-03 – 2015-11-05 (×13): 10 mg via ORAL
  Filled 2015-11-03 (×13): qty 2

## 2015-11-03 MED ORDER — ACETAMINOPHEN 500 MG PO TABS
1000.0000 mg | ORAL_TABLET | Freq: Four times a day (QID) | ORAL | Status: AC
  Administered 2015-11-03 – 2015-11-04 (×4): 1000 mg via ORAL
  Filled 2015-11-03 (×4): qty 2

## 2015-11-03 MED ORDER — MIDAZOLAM HCL 5 MG/5ML IJ SOLN
INTRAMUSCULAR | Status: DC | PRN
  Administered 2015-11-03: 2 mg via INTRAVENOUS

## 2015-11-03 MED ORDER — FUROSEMIDE 20 MG PO TABS: 20.0000 mg | ORAL_TABLET | ORAL | Status: DC | PRN

## 2015-11-03 MED ORDER — METHOCARBAMOL 500 MG PO TABS
500.0000 mg | ORAL_TABLET | Freq: Four times a day (QID) | ORAL | Status: DC | PRN
  Administered 2015-11-03 – 2015-11-06 (×7): 500 mg via ORAL
  Filled 2015-11-03 (×8): qty 1

## 2015-11-03 MED ORDER — RIVAROXABAN 10 MG PO TABS
10.0000 mg | ORAL_TABLET | Freq: Every day | ORAL | Status: DC
  Administered 2015-11-04 – 2015-11-06 (×3): 10 mg via ORAL
  Filled 2015-11-03 (×3): qty 1

## 2015-11-03 MED ORDER — DEXAMETHASONE SODIUM PHOSPHATE 10 MG/ML IJ SOLN
10.0000 mg | Freq: Once | INTRAMUSCULAR | Status: AC
  Administered 2015-11-04: 10 mg via INTRAVENOUS
  Filled 2015-11-03: qty 1

## 2015-11-03 MED ORDER — FLUTICASONE PROPIONATE 50 MCG/ACT NA SUSP
1.0000 | Freq: Two times a day (BID) | NASAL | Status: DC | PRN
  Filled 2015-11-03: qty 16

## 2015-11-03 MED ORDER — PNEUMOCOCCAL VAC POLYVALENT 25 MCG/0.5ML IJ INJ
0.5000 mL | INJECTION | INTRAMUSCULAR | Status: DC
  Filled 2015-11-03 (×2): qty 0.5

## 2015-11-03 MED ORDER — PROMETHAZINE HCL 25 MG/ML IJ SOLN: 6.2500 mg | INTRAMUSCULAR | Status: DC | PRN

## 2015-11-03 MED ORDER — PHENYLEPHRINE 40 MCG/ML (10ML) SYRINGE FOR IV PUSH (FOR BLOOD PRESSURE SUPPORT)
PREFILLED_SYRINGE | INTRAVENOUS | Status: AC
  Filled 2015-11-03: qty 10

## 2015-11-03 MED ORDER — MENTHOL 3 MG MT LOZG: 1.0000 | LOZENGE | OROMUCOSAL | Status: DC | PRN

## 2015-11-03 MED ORDER — ONDANSETRON HCL 4 MG PO TABS: 4.0000 mg | ORAL_TABLET | Freq: Four times a day (QID) | ORAL | Status: DC | PRN

## 2015-11-03 MED ORDER — PROPOFOL 500 MG/50ML IV EMUL
INTRAVENOUS | Status: DC | PRN
  Administered 2015-11-03: 75 ug/kg/min via INTRAVENOUS

## 2015-11-03 MED ORDER — METOCLOPRAMIDE HCL 5 MG PO TABS: 5.0000 mg | ORAL_TABLET | Freq: Three times a day (TID) | ORAL | Status: DC | PRN

## 2015-11-03 MED ORDER — LIDOCAINE HCL (CARDIAC) 20 MG/ML IV SOLN
INTRAVENOUS | Status: AC
  Filled 2015-11-03: qty 5

## 2015-11-03 MED ORDER — TRANEXAMIC ACID 1000 MG/10ML IV SOLN
1000.0000 mg | INTRAVENOUS | Status: DC
  Filled 2015-11-03: qty 10

## 2015-11-03 MED ORDER — PROPOFOL 10 MG/ML IV BOLUS
INTRAVENOUS | Status: AC
  Filled 2015-11-03: qty 20

## 2015-11-03 MED ORDER — HYDROMORPHONE HCL 1 MG/ML IJ SOLN: 0.2500 mg | INTRAMUSCULAR | Status: DC | PRN

## 2015-11-03 MED ORDER — BUPIVACAINE IN DEXTROSE 0.75-8.25 % IT SOLN
INTRATHECAL | Status: DC | PRN
  Administered 2015-11-03: 1.5 mL via INTRATHECAL

## 2015-11-03 MED ORDER — DOCUSATE SODIUM 100 MG PO CAPS
100.0000 mg | ORAL_CAPSULE | Freq: Two times a day (BID) | ORAL | Status: DC
  Administered 2015-11-03 – 2015-11-05 (×5): 100 mg via ORAL
  Filled 2015-11-03 (×6): qty 1

## 2015-11-03 MED ORDER — MEPERIDINE HCL 50 MG/ML IJ SOLN: 6.2500 mg | INTRAMUSCULAR | Status: DC | PRN

## 2015-11-03 MED ORDER — ONDANSETRON HCL 4 MG/2ML IJ SOLN
4.0000 mg | Freq: Four times a day (QID) | INTRAMUSCULAR | Status: DC | PRN
  Administered 2015-11-05: 4 mg via INTRAVENOUS
  Filled 2015-11-03 (×2): qty 2

## 2015-11-03 MED ORDER — BUPIVACAINE LIPOSOME 1.3 % IJ SUSP
INTRAMUSCULAR | Status: DC | PRN
  Administered 2015-11-03: 20 mL

## 2015-11-03 MED ORDER — ONDANSETRON HCL 4 MG/2ML IJ SOLN
INTRAMUSCULAR | Status: AC
  Filled 2015-11-03: qty 2

## 2015-11-03 MED ORDER — PRAVASTATIN SODIUM 20 MG PO TABS
40.0000 mg | ORAL_TABLET | Freq: Every day | ORAL | Status: DC
  Administered 2015-11-03 – 2015-11-05 (×3): 40 mg via ORAL
  Filled 2015-11-03 (×3): qty 2

## 2015-11-03 MED ORDER — DEXAMETHASONE SODIUM PHOSPHATE 10 MG/ML IJ SOLN
INTRAMUSCULAR | Status: DC | PRN
  Administered 2015-11-03: 10 mg via INTRAVENOUS

## 2015-11-03 MED ORDER — DEXAMETHASONE SODIUM PHOSPHATE 10 MG/ML IJ SOLN
INTRAMUSCULAR | Status: AC
  Filled 2015-11-03: qty 1

## 2015-11-03 MED ORDER — CEFAZOLIN SODIUM-DEXTROSE 2-4 GM/100ML-% IV SOLN
INTRAVENOUS | Status: AC
  Filled 2015-11-03: qty 100

## 2015-11-03 MED ORDER — ACETAMINOPHEN 325 MG PO TABS: 650.0000 mg | ORAL_TABLET | Freq: Four times a day (QID) | ORAL | Status: DC | PRN

## 2015-11-03 MED ORDER — SODIUM CHLORIDE 0.9 % IJ SOLN
INTRAMUSCULAR | Status: DC | PRN
Start: 2015-11-03 — End: 2015-11-03
  Administered 2015-11-03: 30 mL

## 2015-11-03 MED ORDER — DEXAMETHASONE SODIUM PHOSPHATE 10 MG/ML IJ SOLN: 10.0000 mg | Freq: Once | INTRAMUSCULAR | Status: DC

## 2015-11-03 MED ORDER — CEFAZOLIN SODIUM-DEXTROSE 2-4 GM/100ML-% IV SOLN
2.0000 g | Freq: Once | INTRAVENOUS | Status: AC
Start: 2015-11-03 — End: 2015-11-03
  Administered 2015-11-03: 2 g via INTRAVENOUS
  Filled 2015-11-03: qty 100

## 2015-11-03 MED ORDER — BUPIVACAINE HCL (PF) 0.25 % IJ SOLN
INTRAMUSCULAR | Status: AC
  Filled 2015-11-03: qty 30

## 2015-11-03 MED ORDER — CEFAZOLIN SODIUM-DEXTROSE 2-4 GM/100ML-% IV SOLN
2.0000 g | INTRAVENOUS | Status: DC
  Filled 2015-11-03: qty 100

## 2015-11-03 MED ORDER — DIPHENHYDRAMINE HCL 12.5 MG/5ML PO ELIX: 12.5000 mg | ORAL_SOLUTION | ORAL | Status: DC | PRN

## 2015-11-03 MED ORDER — MORPHINE SULFATE (PF) 2 MG/ML IV SOLN
1.0000 mg | INTRAVENOUS | Status: DC | PRN
  Administered 2015-11-03 (×2): 1 mg via INTRAVENOUS
  Filled 2015-11-03 (×2): qty 1

## 2015-11-03 MED ORDER — ACETAMINOPHEN 650 MG RE SUPP: 650.0000 mg | Freq: Four times a day (QID) | RECTAL | Status: DC | PRN

## 2015-11-03 MED ORDER — 0.9 % SODIUM CHLORIDE (POUR BTL) OPTIME
TOPICAL | Status: DC | PRN
  Administered 2015-11-03: 1000 mL

## 2015-11-03 SURGICAL SUPPLY — 54 items
BAG DECANTER FOR FLEXI CONT (MISCELLANEOUS) ×1 IMPLANT
BAG SPEC THK2 15X12 ZIP CLS (MISCELLANEOUS) ×1
BAG ZIPLOCK 12X15 (MISCELLANEOUS) ×3 IMPLANT
BANDAGE ACE 6X5 VEL STRL LF (GAUZE/BANDAGES/DRESSINGS) ×3 IMPLANT
BLADE SAG 18X100X1.27 (BLADE) ×3 IMPLANT
BLADE SAW SGTL 11.0X1.19X90.0M (BLADE) ×3 IMPLANT
BOWL SMART MIX CTS (DISPOSABLE) ×3 IMPLANT
CEMENT HV SMART SET (Cement) ×6 IMPLANT
CLOSURE WOUND 1/2 X4 (GAUZE/BANDAGES/DRESSINGS) ×2
CLOTH BEACON ORANGE TIMEOUT ST (SAFETY) ×3 IMPLANT
CUFF TOURN SGL QUICK 34 (TOURNIQUET CUFF) ×3
CUFF TRNQT CYL 34X4X40X1 (TOURNIQUET CUFF) ×1 IMPLANT
DECANTER SPIKE VIAL GLASS SM (MISCELLANEOUS) ×3 IMPLANT
DRAPE U-SHAPE 47X51 STRL (DRAPES) ×3 IMPLANT
DRSG ADAPTIC 3X8 NADH LF (GAUZE/BANDAGES/DRESSINGS) ×3 IMPLANT
DRSG PAD ABDOMINAL 8X10 ST (GAUZE/BANDAGES/DRESSINGS) ×3 IMPLANT
DURAPREP 26ML APPLICATOR (WOUND CARE) ×3 IMPLANT
ELECT REM PT RETURN 9FT ADLT (ELECTROSURGICAL) ×3
ELECTRODE REM PT RTRN 9FT ADLT (ELECTROSURGICAL) ×1 IMPLANT
EVACUATOR 1/8 PVC DRAIN (DRAIN) ×3 IMPLANT
GAUZE SPONGE 4X4 12PLY STRL (GAUZE/BANDAGES/DRESSINGS) ×3 IMPLANT
GLOVE BIO SURGEON STRL SZ7.5 (GLOVE) IMPLANT
GLOVE BIO SURGEON STRL SZ8 (GLOVE) ×3 IMPLANT
GLOVE BIOGEL PI IND STRL 6.5 (GLOVE) IMPLANT
GLOVE BIOGEL PI IND STRL 8 (GLOVE) ×1 IMPLANT
GLOVE BIOGEL PI INDICATOR 6.5 (GLOVE)
GLOVE BIOGEL PI INDICATOR 8 (GLOVE) ×2
GLOVE SURG SS PI 6.5 STRL IVOR (GLOVE) IMPLANT
GOWN STRL REUS W/TWL LRG LVL3 (GOWN DISPOSABLE) ×3 IMPLANT
GOWN STRL REUS W/TWL XL LVL3 (GOWN DISPOSABLE) IMPLANT
HANDPIECE INTERPULSE COAX TIP (DISPOSABLE) ×3
IMMOBILIZER KNEE 20 (SOFTGOODS) ×5 IMPLANT
IMMOBILIZER KNEE 20 THIGH 36 (SOFTGOODS) ×1 IMPLANT
MANIFOLD NEPTUNE II (INSTRUMENTS) ×3 IMPLANT
NS IRRIG 1000ML POUR BTL (IV SOLUTION) ×3 IMPLANT
PACK TOTAL KNEE CUSTOM (KITS) ×3 IMPLANT
PAD ABD 8X10 STRL (GAUZE/BANDAGES/DRESSINGS) ×2 IMPLANT
PADDING CAST ABS 6INX4YD NS (CAST SUPPLIES) ×4
PADDING CAST ABS COTTON 6X4 NS (CAST SUPPLIES) IMPLANT
PADDING CAST COTTON 6X4 STRL (CAST SUPPLIES) ×9 IMPLANT
POSITIONER SURGICAL ARM (MISCELLANEOUS) ×3 IMPLANT
SET HNDPC FAN SPRY TIP SCT (DISPOSABLE) ×1 IMPLANT
STRIP CLOSURE SKIN 1/2X4 (GAUZE/BANDAGES/DRESSINGS) ×4 IMPLANT
SUT MNCRL AB 4-0 PS2 18 (SUTURE) ×3 IMPLANT
SUT VIC AB 2-0 CT1 27 (SUTURE) ×9
SUT VIC AB 2-0 CT1 TAPERPNT 27 (SUTURE) ×3 IMPLANT
SUT VLOC 180 0 24IN GS25 (SUTURE) ×3 IMPLANT
SYR 50ML LL SCALE MARK (SYRINGE) ×3 IMPLANT
TRAY FOLEY W/METER SILVER 14FR (SET/KITS/TRAYS/PACK) ×3 IMPLANT
TRAY FOLEY W/METER SILVER 16FR (SET/KITS/TRAYS/PACK) ×1 IMPLANT
WATER STERILE IRR 1000ML POUR (IV SOLUTION) ×4 IMPLANT
WATER STERILE IRR 1500ML POUR (IV SOLUTION) ×1 IMPLANT
WRAP KNEE MAXI GEL POST OP (GAUZE/BANDAGES/DRESSINGS) ×3 IMPLANT
YANKAUER SUCT BULB TIP 10FT TU (MISCELLANEOUS) ×3 IMPLANT

## 2015-11-03 NOTE — H&P (View-Only) (Signed)
Misty Cervantes DOB: [birth year] Divorced / Language: Cleophus Molt / Race: White Female Date of Admission:  11/03/2015 CC:  Right Knee Pain History of Present Illness  The patient is a 70 year old female who comes in for a preoperative History and Physical. The patient is scheduled for a right total knee arthroplasty to be performed by Dr. Dione Plover. Aluisio, MD at [facility] on 11/03/2015. The patient is a 70 year old female who presented for follow up of their knee. The patient is being followed for their right osteoarthritis. They are now 1 1/2 years out from Synvisc series (4 weeks s/p new injury). Symptoms reported include: pain, aching and difficulty ambulating. The patient feels that they are doing poorly and report their pain level to be moderate. Current treatment includes: home exercise program, bracing (economy hinge), NSAIDs and icing. The following medication has been used for pain control: Hydrocodone (needs refill; she normally only takes it 2-3 times per day). The patient has reported improvement of their symptoms with: bracing and ice. She is scheduled for TKA on 11/03/15. She is ready to proceed with knee replacement. I have previously replaced her hip and she did beautifully with that. She was not really familiar with the knee replacement. Knee has gotten progressively worse over time. Injections are no longer beneficial. They have been treated conservatively in the past for the above stated problem and despite conservative measures, they continue to have progressive pain and severe functional limitations and dysfunction. They have failed non-operative management including home exercise, medications, and injections. It is felt that they would benefit from undergoing total joint replacement. Risks and benefits of the procedure have been discussed with the patient and they elect to proceed with surgery. There are no active contraindications to surgery such as ongoing infection or rapidly  progressive neurological disease.  Problem List/Past Medical S/P Left total hip arthroplasty (V43.64)  Primary osteoarthritis of one knee, right (M17.11)  NSAID long-term use (Z79.1)  High blood pressure  Migraine Headache  Osteoarthritis  Bronchitis  Pneumonia  Past History Hypercholesterolemia  Urinary Tract Infection  Menopause  Measles  Cataract  Allergies Probiotic & Acidophilus Ex St *ANTIDIARRHEALS*   Family History  Cancer  father and grandfather mothers side Congestive Heart Failure  mother Diabetes Mellitus  mother, sister and child Drug / Alcohol Addiction  child Heart disease in female family member before age 68  Kidney disease  father Liver Disease, Chronic  sister  Social History Alcohol use  never consumed alcohol Children  2 Current work status  retired Engineer, agricultural (Currently)  no Exercise  Exercises daily; does running / walking Illicit drug use  no Living situation  live alone Marital status  divorced Number of flights of stairs before winded  2-3 Previously in rehab  no Tobacco / smoke exposure  no Tobacco use  Never smoker. never smoker  Medication History  Aspirin (81MG  Tablet, 1 Oral) Active. Diclofenac Sodium (75MG  Tablet DR, 1 (one) Oral two times daily, Taken starting 09/18/2015) Active. Flonase Active. Furosemide (20MG  Tablet, Oral) Active. Norco (5-325MG  Tablet, 1-2 Oral po qd, Taken starting 10/14/2015) Active. Multiple Vitamin (1 Oral) Active. (Centrum Silver) Potassium Chloride (Oral) Specific strength unknown - Active. Pravastatin Sodium (40MG  Tablet, Oral) Active. Triamterene-HCTZ (37.5-25MG  Capsule, Oral) Active. Omeprazole (20MG  Capsule DR, Oral) Active.  Past Surgical History Carpal Tunnel Repair  right Cataract Surgery  bilateral Total Hip Replacement  Date: 02/2010. left   Review of Systems  General Not Present- Chills, Fatigue, Fever,  Memory Loss, Night Sweats,  Weight Gain and Weight Loss. Skin Not Present- Eczema, Hives, Itching, Lesions and Rash. HEENT Not Present- Dentures, Double Vision, Headache, Hearing Loss, Tinnitus and Visual Loss. Respiratory Not Present- Allergies, Chronic Cough, Coughing up blood, Shortness of breath at rest and Shortness of breath with exertion. Cardiovascular Not Present- Chest Pain, Difficulty Breathing Lying Down, Murmur, Palpitations, Racing/skipping heartbeats and Swelling. Gastrointestinal Not Present- Abdominal Pain, Bloody Stool, Constipation, Diarrhea, Difficulty Swallowing, Heartburn, Jaundice, Loss of appetitie, Nausea and Vomiting. Female Genitourinary Not Present- Blood in Urine, Discharge, Flank Pain, Incontinence, Painful Urination, Urgency, Urinary frequency, Urinary Retention, Urinating at Night and Weak urinary stream. Musculoskeletal Present- Joint Pain, Joint Swelling and Morning Stiffness. Not Present- Back Pain, Muscle Pain, Muscle Weakness and Spasms. Neurological Not Present- Blackout spells, Difficulty with balance, Dizziness, Paralysis, Tremor and Weakness. Psychiatric Not Present- Insomnia.  Vitals Weight: 152 lb Height: 60in Weight was reported by patient. Height was reported by patient. Body Surface Area: 1.66 m Body Mass Index: 29.69 kg/m  Pulse: 84 (Regular)  BP: 138/76 (Sitting, Left Arm, Standard)   Physical Exam General Mental Status -Alert, cooperative and good historian. General Appearance-pleasant, Not in acute distress. Orientation-Oriented X3. Build & Nutrition-Well nourished and Well developed.  Head and Neck Head-normocephalic, atraumatic . Neck Global Assessment - supple, no bruit auscultated on the right, no bruit auscultated on the left.  Eye Pupil - Bilateral-Regular and Round. Motion - Bilateral-EOMI.  Chest and Lung Exam Auscultation Breath sounds - clear at anterior chest wall and clear at posterior chest wall. Adventitious sounds -  No Adventitious sounds.  Cardiovascular Auscultation Rhythm - Regular rate and rhythm. Heart Sounds - S1 WNL and S2 WNL. Murmurs & Other Heart Sounds - Auscultation of the heart reveals - No Murmurs.  Abdomen Palpation/Percussion Tenderness - Abdomen is non-tender to palpation. Rigidity (guarding) - Abdomen is soft. Auscultation Auscultation of the abdomen reveals - Bowel sounds normal.  Female Genitourinary Note: Not done, not pertinent to present illness   Musculoskeletal Note: She is alert and oriented in no apparent distress. Her right hip has excellent range of motion with no discomfort. Her right knee shows no effusion. Her range is about 5 to 125. She has got a valgus deformity. She is tender lateral greater than medial and she has got some slight laxity to varus and valgus stressing. Pulse, sensation and motor are intact. Left lower extremity exam is unremarkable.  RADIOGRAPHS Reviewed AP and lateral of the right knee showing valgus deformity with bone on bone change in the lateral and patellofemoral compartments.   Assessment & Plan  Primary osteoarthritis of one knee, right (M17.11)  Note:Surgical Plans: Right Total Knee Replacement  Disposition: Home with Son  PCP: Dr. Sabra Heck, [facility]  IV TXA  Anesthesia Issues: None  Veritas Study Patient Traditional PT  Signed electronically by Ok Edwards, III PA-C

## 2015-11-03 NOTE — Interval H&P Note (Signed)
History and Physical Interval Note:  11/03/2015 6:47 AM  Misty Cervantes  has presented today for surgery, with the diagnosis of RIGHT KNEE OA  The various methods of treatment have been discussed with the patient and family. After consideration of risks, benefits and other options for treatment, the patient has consented to  Procedure(s): RIGHT TOTAL KNEE ARTHROPLASTY (Right) as a surgical intervention .  The patient's history has been reviewed, patient examined, no change in status, stable for surgery.  I have reviewed the patient's chart and labs.  Questions were answered to the patient's satisfaction.     Gearlean Alf

## 2015-11-03 NOTE — Anesthesia Preprocedure Evaluation (Addendum)
Anesthesia Evaluation  Patient identified by MRN, date of birth, ID band Patient awake    Reviewed: Allergy & Precautions, NPO status , Patient's Chart, lab work & pertinent test results  Airway Mallampati: II  TM Distance: >3 FB Neck ROM: Full    Dental  (+) Dental Advisory Given, Upper Dentures, Lower Dentures   Pulmonary neg pulmonary ROS,    breath sounds clear to auscultation       Cardiovascular hypertension,  Rhythm:Regular Rate:Normal     Neuro/Psych  Headaches, negative psych ROS   GI/Hepatic Neg liver ROS, PUD,   Endo/Other  negative endocrine ROS  Renal/GU negative Renal ROS  negative genitourinary   Musculoskeletal  (+) Arthritis ,   Abdominal   Peds negative pediatric ROS (+)  Hematology negative hematology ROS (+)   Anesthesia Other Findings   Reproductive/Obstetrics negative OB ROS                            Lab Results  Component Value Date   WBC 4.6 10/27/2015   HGB 12.9 10/27/2015   HCT 38.8 10/27/2015   MCV 93.9 10/27/2015   PLT 211 10/27/2015   Lab Results  Component Value Date   INR 0.98 10/27/2015   INR 1.68 (H) 03/06/2010   INR 1.33 03/05/2010   10/2015 EKG: normal sinus rhythm, 1st degree AV block.  Anesthesia Physical Anesthesia Plan  ASA: II  Anesthesia Plan: Spinal   Post-op Pain Management:    Induction: Intravenous  Airway Management Planned: Natural Airway  Additional Equipment:   Intra-op Plan:   Post-operative Plan:   Informed Consent: I have reviewed the patients History and Physical, chart, labs and discussed the procedure including the risks, benefits and alternatives for the proposed anesthesia with the patient or authorized representative who has indicated his/her understanding and acceptance.     Plan Discussed with: CRNA  Anesthesia Plan Comments:        Anesthesia Quick Evaluation

## 2015-11-03 NOTE — Evaluation (Signed)
Physical Therapy Evaluation Patient Details Name: Misty Cervantes MRN: [id number] DOB: 1946/02/07 Today's Date: 11/03/2015   History of Present Illness  s/p RTKA; PMHx: L THA  Clinical Impression  Pt admitted with above diagnosis. Pt currently with functional limitations due to the deficits listed below (see PT Problem List). Pt will benefit from skilled PT to increase their independence and safety with mobility to allow discharge to the venue listed below.  Pt should progress well, amb 40' with min/guard     Follow Up Recommendations Home health PT    Equipment Recommendations  None recommended by PT    Recommendations for Other Services       Precautions / Restrictions Precautions Precautions: Fall;Knee Required Braces or Orthoses: Knee Immobilizer - Right Knee Immobilizer - Right: Discontinue once straight leg raise with < 10 degree lag Restrictions Weight Bearing Restrictions: No RLE Weight Bearing: Weight bearing as tolerated      Mobility  Bed Mobility Overal bed mobility: Needs Assistance Bed Mobility: Supine to Sit     Supine to sit: Min guard     General bed mobility comments: for safety, incr time  Transfers Overall transfer level: Needs assistance Equipment used: Rolling walker (2 wheeled) Transfers: Sit to/from Stand Sit to Stand: Min assist         General transfer comment: cues for hand placement and RLE position  Ambulation/Gait Ambulation/Gait assistance: Min guard;Min assist Ambulation Distance (Feet): 40 Feet Assistive device: Rolling walker (2 wheeled) Gait Pattern/deviations: Step-to pattern;Decreased step length - right;Decreased step length - left;Decreased weight shift to right;Antalgic     General Gait Details: cues for sequence and RW safety  Stairs            Wheelchair Mobility    Modified Rankin (Stroke Patients Only)       Balance                                             Pertinent  Vitals/Pain Pain Assessment: 0-10 Pain Score: 7  Pain Location: righ tknee Pain Descriptors / Indicators: Sore Pain Intervention(s): Limited activity within patient's tolerance;Monitored during session;Premedicated before session;Ice applied;Repositioned    Home Living Family/patient expects to be discharged to:: Private residence Living Arrangements:  (son and grandchildren) Available Help at Discharge: Family Type of Home: House (townhouse) Home Access: Stairs to enter   Technical brewer of Steps: 3 separated  Home Layout: Two level;1/2 bath on main level Home Equipment: Walker - 2 wheels;Bedside commode;Grab bars - toilet;Cane - single point      Prior Function Level of Independence: Independent               Hand Dominance        Extremity/Trunk Assessment   Upper Extremity Assessment: Overall WFL for tasks assessed;Defer to OT evaluation           Lower Extremity Assessment: RLE deficits/detail RLE Deficits / Details: ankle WFL, knee ext and hip flexion 2/5, limited by post op pain and weakness       Communication   Communication: No difficulties  Cognition Arousal/Alertness: Awake/alert Behavior During Therapy: WFL for tasks assessed/performed Overall Cognitive Status: Within Functional Limits for tasks assessed                      General Comments      Exercises Total Joint Exercises Ankle  Circles/Pumps: AROM;Both;10 reps Quad Sets: AROM;10 reps;Both      Assessment/Plan    PT Assessment Patient needs continued PT services  PT Diagnosis Difficulty walking   PT Problem List Decreased strength;Decreased range of motion;Decreased activity tolerance;Decreased balance;Decreased mobility;Decreased knowledge of use of DME;Pain  PT Treatment Interventions Functional mobility training;Gait training;DME instruction;Therapeutic activities;Therapeutic exercise;Stair training;Patient/family education   PT Goals (Current goals can be found  in the Care Plan section) Acute Rehab PT Goals Patient Stated Goal: return to [facility], be able to take care of her grandchildren PT Goal Formulation: With patient Time For Goal Achievement: 11/06/15 Potential to Achieve Goals: Good    Frequency 7X/week   Barriers to discharge        Co-evaluation               End of Session Equipment Utilized During Treatment: Gait belt;Right knee immobilizer Activity Tolerance: Patient tolerated treatment well Patient left: with call bell/phone within reach;in bed;with bed alarm set;with family/visitor present           Time: [id number] PT Time Calculation (min) (ACUTE ONLY): 23 min   Charges:   PT Evaluation $PT Eval Low Complexity: 1 Procedure PT Treatments $Gait Training: 8-22 mins   PT G Codes:        Misty Cervantes 2015/11/18, 6:07 PM

## 2015-11-03 NOTE — Anesthesia Procedure Notes (Signed)
Spinal  Patient location during procedure: OR End time: 11/03/2015 9:22 AM Staffing Resident/CRNA: Noralyn Pick D Performed: anesthesiologist and resident/CRNA  Preanesthetic Checklist Completed: patient identified, site marked, surgical consent, pre-op evaluation, timeout performed, IV checked, risks and benefits discussed and monitors and equipment checked Spinal Block Patient position: sitting Prep: Betadine Patient monitoring: heart rate, continuous pulse ox and blood pressure Approach: midline Location: L3-4 Injection technique: single-shot Needle Needle type: Sprotte  Needle gauge: 24 G Needle length: 9 cm Assessment Sensory level: T6 Additional Notes Expiration date of kit checked and confirmed. Patient tolerated procedure well, without complications.

## 2015-11-03 NOTE — Op Note (Signed)
Pre-operative diagnosis- Osteoarthritis Right knee(s)  Post-operative diagnosis- Osteoarthritis  Right knee(s)  Procedure-   Right Total Knee Arthroplasty  Surgeon- Dione Plover. Cyrus Ramsburg, MD  Assistant- Ardeen Jourdain, PA-C   Anesthesia-  Spinal   EBL- * No blood loss amount entered *   Drains Hemovac x 1   Tourniquet time  Total Tourniquet Time Documented: Thigh (Right) - 30 minutes Total: Thigh (Right) - 30 minutes    Complications- None  Condition-PACU - hemodynamically stable.   Brief Clinical Note  Misty Cervantes is a 70 y.o. year old female with end stage OA of her right knee with progressively worsening pain and dysfunction. She has constant pain, with activity and at rest and significant functional deficits with difficulties even with ADLs. She has had extensive non-op management including analgesics, injections of cortisone and viscosupplements, and home exercise program, but remains in significant pain with significant dysfunction.Radiographs show bone on bone arthritis lateral and patellofemoral with severe valgus deformity. She presents now for right Total Knee Arthroplasty.    Procedure in detail---       The patient is brought into the operating room and positioned supine on the operating table. After successful administration of Spinal anesthetic, a tourniquet is placed high on the Right thigh(s) and the lower extremity is prepped and draped in the usual sterile fashion. Time out is performed by the operating team and then the Right  lower extremity is wrapped in Esmarch, knee flexed and the tourniquet inflated to 300 mmHg.       A midline incision is made with a ten blade through the subcutaneous tissue to the level of the extensor mechanism. A fresh blade is used to make a lateral parapatellar arthrotomy due to the patients' valgus deformity. Soft tissue over the proximal lateral tibia is subperiosteally elevated to the joint line with a knife to the posterolateral  corner but not including the structures of the posterolateral corner. Soft tissue over the proximal medial tibia is elevated with attention being paid to avoiding the patellar tendon on the tibial tubercle. The patella is everted medially, knee flexed 90 degrees and the ACL and PCL are removed. Findings are bone on bone lateral and patellofemoral with large global osteophytes. .       The drill is used to create a starting hole in the distal femur and the canal is thoroughly irrigated with sterile saline to remove the fatty contents. The 5 degree Right  valgus alignment guide is placed into the femoral canal and the distal femoral cutting block is pinned to remove 10  mm off the distal femur. Resection is made with an oscillating saw.      The tibia is subluxed forward and the menisci are removed. The extramedullary alignment guide is placed referencing proximally at the medial aspect of the tibial tubercle and distally along the second metatarsal axis and tibial crest. The block is pinned to remove 63mm off the more deficient lateral side. Resection is made with an oscillating saw. Size 2.5  is the most appropriate size for the tibia and the proximal tibia is prepared with the modular drill and keel punch for that size.      The femoral sizing guide is placed and size 2.5  is most appropriate. Rotation is marked off the epicondylar axis and confirmed by creating a rectangular flexion gap at 90 degrees. The size 2.5  cutting block is pinned in this rotation and the anterior, posterior and chamfer cuts are made with the  oscillating saw. The intercondylar block is then placed and that cut is made.      Trial size 2.5  tibial component, trial size 2.5  posterior stabilized femur and a 12.5  mm posterior stabilized rotating platform insert trial is placed. Full extension is achieved with excellent varus/valgus and   anterior/posterior balance throughout full range of motion. The patella is everted and thickness  measured to be 22  mm. Free hand resection is taken to 12 mm, a 35 template is placed, lug holes are drilled, trial patella is placed, and it tracks normally. Osteophytes are removed off the posterior femur with the trial in place. All trials are removed and the cut bone surfaces prepared with pulsatile lavage. Cement is mixed and once ready for implantation, the size 2.5  tibial implant, size 2.5 posterior stabilized femoral component, and the size 35  patella are cemented in place and the patella is held with the clamp. The trial insert is placed and the knee held in full extension. The Exparel (20 ml mixed with 30 ml saline) and then 20 ml of .25% Bupivicaine is injected into the extensor mechanism, posterior capsule, medial and lateral gutters and subcutaneous tissues. All extruded cement is removed and once the cement is hard the permanent 12.5  mm posterior stabilized rotating platform insert is placed into the tibial tray.      The wound is copiously irrigated with saline solution and the tourniquet is released for a total   tourniquet time of 30  minutes. Bleeding is identified and controlled with electrocautery. The extensor mechanism is closed with interrupted #1 V-loc leaving open a small area from the superior to inferior pole of the patella to serve as a mini lateral release. Flexion against gravity is 140  degrees and the patella tracks normally. Subcutaneous tissue is closed with 2.0 vicryl and subcuticular with running 4.0 Monocryl.The incision is cleaned and dried and steri-strips and a bulky sterile dressing are applied. The limb is placed into a knee immobilizer and the patient is awakened and transported to recovery in stable condition.      Please note that a surgical assistant was a medical necessity for this procedure in order to perform it in a safe and expeditious manner. Surgical assistant was necessary to retract the ligaments and vital neurovascular structures to prevent injury to them  and also necessary for proper positioning of the limb to allow for anatomic placement of the prosthesis.    Dione Plover Etheridge Geil, MD    11/03/2015, 10:18 AM

## 2015-11-03 NOTE — Anesthesia Postprocedure Evaluation (Signed)
Anesthesia Post Note  Patient: Misty Cervantes  Procedure(s) Performed: Procedure(s) (LRB): RIGHT TOTAL KNEE ARTHROPLASTY (Right)  Patient location during evaluation: PACU Anesthesia Type: Spinal Level of consciousness: oriented and awake and alert Pain management: pain level controlled Vital Signs Assessment: post-procedure vital signs reviewed and stable Respiratory status: spontaneous breathing, respiratory function stable and patient connected to nasal cannula oxygen Cardiovascular status: blood pressure returned to baseline and stable Postop Assessment: no headache and no backache Anesthetic complications: no    Last Vitals:  Vitals:   11/03/15 1200 11/03/15 1219  BP: 115/64 113/62  Pulse: 70 70  Resp: 20 18  Temp:  36.7 C    Last Pain:  Vitals:   11/03/15 1238  TempSrc:   PainSc: 0-No pain    LLE Motor Response: (P) Purposeful movement (pt is able to move knee from side to side) (11/03/15 1239) LLE Sensation: (P) Numbness;Tingling (11/03/15 1239) RLE Motor Response: (P) Purposeful movement (pt is able to move knee from side to side) (11/03/15 1239) RLE Sensation: (P) Numbness;Tingling (11/03/15 1239) L Sensory Level: (P) L4-Anterior knee, lower leg (11/03/15 1239) R Sensory Level: (P) L4-Anterior knee, lower leg (11/03/15 1239)  Effie Berkshire

## 2015-11-03 NOTE — Transfer of Care (Signed)
Immediate Anesthesia Transfer of Care Note  Patient: Misty Cervantes  Procedure(s) Performed: Procedure(s): RIGHT TOTAL KNEE ARTHROPLASTY (Right)  Patient Location: PACU  Anesthesia Type:Spinal  Level of Consciousness:  sedated, patient cooperative and responds to stimulation  Airway & Oxygen Therapy:Patient Spontanous Breathing and Patient connected to face mask oxgen  Post-op Assessment:  Report given to PACU RN and Post -op Vital signs reviewed and stable  Post vital signs:  Reviewed and stable  Last Vitals:  Vitals:   11/03/15 0634  BP: (!) 145/102  Pulse: 75  Resp: 16  Temp: [id number] C    Complications: No apparent anesthesia complications

## 2015-11-04 LAB — CBC
HCT: 33.9 % — ABNORMAL LOW (ref 36.0–46.0)
HEMOGLOBIN: 11.4 g/dL — AB (ref 12.0–15.0)
MCH: 31.3 pg (ref 26.0–34.0)
MCHC: 33.6 g/dL (ref 30.0–36.0)
MCV: 93.1 fL (ref 78.0–100.0)
PLATELETS: 207 10*3/uL (ref 150–400)
RBC: 3.64 MIL/uL — ABNORMAL LOW (ref 3.87–5.11)
RDW: 12.3 % (ref 11.5–15.5)
WBC: 9 10*3/uL (ref 4.0–10.5)

## 2015-11-04 LAB — BASIC METABOLIC PANEL
Anion gap: 9 (ref 5–15)
BUN: 22 mg/dL — AB (ref 6–20)
CALCIUM: 8.2 mg/dL — AB (ref 8.9–10.3)
CHLORIDE: 98 mmol/L — AB (ref 101–111)
CO2: 26 mmol/L (ref 22–32)
CREATININE: 1.12 mg/dL — AB (ref 0.44–1.00)
GFR, EST AFRICAN AMERICAN: 56 mL/min — AB (ref >60)
GFR, EST NON AFRICAN AMERICAN: 49 mL/min — AB (ref >60)
Glucose, Bld: 319 mg/dL — ABNORMAL HIGH (ref 65–99)
Potassium: 3.8 mmol/L (ref 3.5–5.1)
SODIUM: 133 mmol/L — AB (ref 135–145)

## 2015-11-04 MED ORDER — RIVAROXABAN 10 MG PO TABS: 10.0000 mg | ORAL_TABLET | Freq: Every day | ORAL | 0 refills | Status: DC

## 2015-11-04 MED ORDER — TRAMADOL HCL 50 MG PO TABS: 50.0000 mg | ORAL_TABLET | Freq: Four times a day (QID) | ORAL | 1 refills | Status: DC | PRN

## 2015-11-04 MED ORDER — METHOCARBAMOL 500 MG PO TABS: 500.0000 mg | ORAL_TABLET | Freq: Four times a day (QID) | ORAL | 0 refills | Status: DC | PRN

## 2015-11-04 MED ORDER — OXYCODONE HCL 5 MG PO TABS: 5.0000 mg | ORAL_TABLET | ORAL | 0 refills | Status: DC | PRN

## 2015-11-04 NOTE — Evaluation (Signed)
Occupational Therapy Evaluation Patient Details Name: Misty Cervantes MRN: [id number] DOB: [birth year] Today's Date: 11/04/2015    History of Present Illness s/p RTKA; PMHx: L THA in '11   Clinical Impression   Pt was admitted for the above sx. She got up into chair but was limited by pain and nausea for further mobility. Will follow in acute setting with supervision level goals.    Follow Up Recommendations  Supervision/Assistance - 24 hour    Equipment Recommendations  None recommended by OT    Recommendations for Other Services       Precautions / Restrictions Precautions Precautions: Fall;Knee Required Braces or Orthoses: Knee Immobilizer - Right Knee Immobilizer - Right: Discontinue once straight leg raise with < 10 degree lag Restrictions RLE Weight Bearing: Weight bearing as tolerated      Mobility Bed Mobility   Bed Mobility: Supine to Sit     Supine to sit: Min guard     General bed mobility comments: KI on, extra time  Transfers   Equipment used: Rolling walker (2 wheeled) Transfers: Sit to/from Stand Sit to Stand: Min assist         General transfer comment: cues for hand placement and RLE position    Balance                                            ADL Overall ADL's : Needs assistance/impaired             Lower Body Bathing: Minimal assistance;Sit to/from stand       Lower Body Dressing: Sit to/from stand;Minimal assistance   Toilet Transfer: Minimal assistance;Stand-pivot;BSC             General ADL Comments: pt plans to sponge bathe initially. She will have family's assistance.  She is able to perform UB adls with set up. Educated on knee precautions     Vision     Perception     Praxis      Pertinent Vitals/Pain Pain Assessment: 0-10 Pain Score: 6  Pain Location: R knee Pain Descriptors / Indicators: Sore Pain Intervention(s): Limited activity within patient's tolerance;Monitored during  session;Premedicated before session;Repositioned;Ice applied     Hand Dominance     Extremity/Trunk Assessment Upper Extremity Assessment Upper Extremity Assessment: Overall WFL for tasks assessed           Communication Communication Communication: No difficulties   Cognition Arousal/Alertness: Awake/alert Behavior During Therapy: WFL for tasks assessed/performed Overall Cognitive Status: Within Functional Limits for tasks assessed                     General Comments       Exercises       Shoulder Instructions      Home Living Family/patient expects to be discharged to:: Private residence   Available Help at Discharge: Family               Bathroom Shower/Tub: Teacher, early years/pre: Standard     Home Equipment: Environmental consultant - 2 wheels;Bedside commode;Grab bars - toilet;Cane - single point   Additional Comments: 1/2 bath downstairs      Prior Functioning/Environment Level of Independence: Independent             OT Diagnosis: Acute pain   OT Problem List:     OT Treatment/Interventions: Self-care/ADL training;DME and/or AE  instruction;Patient/family education    OT Goals(Current goals can be found in the care plan section) Acute Rehab OT Goals Patient Stated Goal: return to [facility], be able to take care of her grandchildren OT Goal Formulation: With patient Time For Goal Achievement: 11/11/15 Potential to Achieve Goals: Good ADL Goals Pt Will Perform Grooming: with supervision;standing Pt Will Transfer to Toilet: with supervision;ambulating;bedside commode Pt Will Perform Toileting - Clothing Manipulation and hygiene: with supervision;sit to/from stand  OT Frequency: Min 2X/week   Barriers to D/C:            Co-evaluation              End of Session    Activity Tolerance: Patient limited by pain (felt a little nauseous but passed) Patient left: in chair;with call bell/phone within reach;with chair alarm set    Time: [id number] OT Time Calculation (min): 24 min Charges:  OT General Charges $OT Visit: 1 Procedure OT Evaluation $OT Eval Low Complexity: 1 Procedure G-Codes:    Misty Cervantes 11/12/2015, 8:52 AM  Misty Cervantes, OTR/L [phone] 11/12/15

## 2015-11-04 NOTE — Progress Notes (Signed)
   Subjective: 1 Day Post-Op Procedure(s) (LRB): RIGHT TOTAL KNEE ARTHROPLASTY (Right) Patient reports pain as mild.   Patient seen in rounds with Dr. Wynelle Link. Patient is well, but has had some minor complaints of pain in the knee, requiring pain medications We will resume therapy today.  She walked 40 feet day of surgery. Plan is to go Home after hospital stay.  Objective: Vital signs in last 24 hours: Temp:  [97.8 F (36.6 C)-98.8 F (37.1 C)] 98.2 F (36.8 C) (08/22 0550) Pulse Rate:  [66-104] 84 (08/22 0550) Resp:  [16-21] 16 (08/22 0550) BP: (106-145)/(61-89) 145/89 (08/22 0550) SpO2:  [94 %-100 %] 96 % (08/22 0550)  Intake/Output from previous day:  Intake/Output Summary (Last 24 hours) at 11/04/15 0845 Last data filed at 11/04/15 0600  Gross per 24 hour  Intake          3848.75 ml  Output             2235 ml  Net          1613.75 ml    Intake/Output this shift: No intake/output data recorded.  Labs:  Recent Labs  11/04/15 0417  HGB 11.4*    Recent Labs  11/04/15 0417  WBC 9.0  RBC 3.64*  HCT 33.9*  PLT 207    Recent Labs  11/04/15 0417  NA 133*  K 3.8  CL 98*  CO2 26  BUN 22*  CREATININE 1.12*  GLUCOSE 319*  CALCIUM 8.2*   No results for input(s): LABPT, INR in the last 72 hours.  EXAM General - Patient is Alert, Appropriate and Oriented Extremity - Neurovascular intact Sensation intact distally Dorsiflexion/Plantar flexion intact Dressing - dressing C/D/I Motor Function - intact, moving foot and toes well on exam.  Hemovac pulled without difficulty.  Past Medical History:  Diagnosis Date  . Anemia   . Cataract   . Gastric ulcer 11-17-11   egd  . Headache    migraines  . History of bronchitis   . History of urinary tract infection   . HTN (hypertension)   . Hypercholesterolemia   . Hyperlipemia   . Hyperplastic polyp of intestine 11-17-11   colonoscopy  . Hypertension   . Measles   . Menopause   . Osteoarthritis   .  Pneumonia     Assessment/Plan: 1 Day Post-Op Procedure(s) (LRB): RIGHT TOTAL KNEE ARTHROPLASTY (Right) Principal Problem:   OA (osteoarthritis) of knee  Estimated body mass index is 30.48 kg/m as calculated from the following:   Height as of this encounter: 5' (1.524 m).   Weight as of this encounter: 70.8 kg (156 lb 1 oz). Advance diet Up with therapy Plan for discharge tomorrow Discharge home with home health  DVT Prophylaxis - Xarelto Weight-Bearing as tolerated to right leg D/C O2 and Pulse OX and try on Room Air  Arlee Muslim, PA-C Orthopaedic Surgery 11/04/2015, 8:45 AM

## 2015-11-04 NOTE — Discharge Summary (Signed)
Physician Discharge Summary   Patient ID: Misty Cervantes MRN: [id number] DOB/AGE: 71-Nov-1947 71 y.o.  Admit date: 11/03/2015 Discharge date: 11/06/2015  Primary Diagnosis:  Osteoarthritis Right knee(s)  Admission Diagnoses:  Past Medical History:  Diagnosis Date  . Anemia   . Cataract   . Gastric ulcer 11-17-11   egd  . Headache    migraines  . History of bronchitis   . History of urinary tract infection   . HTN (hypertension)   . Hypercholesterolemia   . Hyperlipemia   . Hyperplastic polyp of intestine 11-17-11   colonoscopy  . Hypertension   . Measles   . Menopause   . Osteoarthritis   . Pneumonia    Discharge Diagnoses:   Principal Problem:   OA (osteoarthritis) of knee  Estimated body mass index is 30.48 kg/m as calculated from the following:   Height as of this encounter: 5' (1.524 m).   Weight as of this encounter: 70.8 kg (156 lb 1 oz).  Procedure:  Procedure(s) (LRB): RIGHT TOTAL KNEE ARTHROPLASTY (Right)   Consults: None  HPI: Misty Cervantes is a 70 y.o. year old female with end stage OA of her right knee with progressively worsening pain and dysfunction. She has constant pain, with activity and at rest and significant functional deficits with difficulties even with ADLs. She has had extensive non-op management including analgesics, injections of cortisone and viscosupplements, and home exercise program, but remains in significant pain with significant dysfunction.Radiographs show bone on bone arthritis lateral and patellofemoral with severe valgus deformity. She presents now for right Total Knee Arthroplasty.  Laboratory Data: Admission on 11/03/2015  Component Date Value Ref Range Status  . Glucose-Capillary 11/03/2015 231* 65 - 99 mg/dL Final  . WBC 11/04/2015 9.0  4.0 - 10.5 K/uL Final  . RBC 11/04/2015 3.64* 3.87 - 5.11 MIL/uL Final  . Hemoglobin 11/04/2015 11.4* 12.0 - 15.0 g/dL Final  . HCT 11/04/2015 33.9* 36.0 - 46.0 % Final  . MCV 11/04/2015  93.1  78.0 - 100.0 fL Final  . MCH 11/04/2015 31.3  26.0 - 34.0 pg Final  . MCHC 11/04/2015 33.6  30.0 - 36.0 g/dL Final  . RDW 11/04/2015 12.3  11.5 - 15.5 % Final  . Platelets 11/04/2015 207  150 - 400 K/uL Final  . Sodium 11/04/2015 133* 135 - 145 mmol/L Final  . Potassium 11/04/2015 3.8  3.5 - 5.1 mmol/L Final  . Chloride 11/04/2015 98* 101 - 111 mmol/L Final  . CO2 11/04/2015 26  22 - 32 mmol/L Final  . Glucose, Bld 11/04/2015 319* 65 - 99 mg/dL Final  . BUN 11/04/2015 22* 6 - 20 mg/dL Final  . Creatinine, Ser 11/04/2015 1.12* 0.44 - 1.00 mg/dL Final  . Calcium 11/04/2015 8.2* 8.9 - 10.3 mg/dL Final  . GFR calc non Af Amer 11/04/2015 49* >60 mL/min Final  . GFR calc Af Amer 11/04/2015 56* >60 mL/min Final   Comment: (NOTE) The eGFR has been calculated using the CKD EPI equation. This calculation has not been validated in all clinical situations. eGFR's persistently <60 mL/min signify possible Chronic Kidney Disease.   Georgiann Hahn gap 11/04/2015 9  5 - 15 Final  Hospital Outpatient Visit on 10/27/2015  Component Date Value Ref Range Status  . aPTT 10/27/2015 27  24 - 36 seconds Final  . WBC 10/27/2015 4.6  4.0 - 10.5 K/uL Final  . RBC 10/27/2015 4.13  3.87 - 5.11 MIL/uL Final  . Hemoglobin 10/27/2015 12.9  12.0 - 15.0  g/dL Final  . HCT 10/27/2015 38.8  36.0 - 46.0 % Final  . MCV 10/27/2015 93.9  78.0 - 100.0 fL Final  . MCH 10/27/2015 31.2  26.0 - 34.0 pg Final  . MCHC 10/27/2015 33.2  30.0 - 36.0 g/dL Final  . RDW 10/27/2015 12.7  11.5 - 15.5 % Final  . Platelets 10/27/2015 211  150 - 400 K/uL Final  . Sodium 10/27/2015 132* 135 - 145 mmol/L Final  . Potassium 10/27/2015 3.4* 3.5 - 5.1 mmol/L Final  . Chloride 10/27/2015 98* 101 - 111 mmol/L Final  . CO2 10/27/2015 26  22 - 32 mmol/L Final  . Glucose, Bld 10/27/2015 342* 65 - 99 mg/dL Final  . BUN 10/27/2015 22* 6 - 20 mg/dL Final  . Creatinine, Ser 10/27/2015 1.12* 0.44 - 1.00 mg/dL Final  . Calcium 10/27/2015 9.4  8.9 -  10.3 mg/dL Final  . Total Protein 10/27/2015 7.2  6.5 - 8.1 g/dL Final  . Albumin 10/27/2015 4.4  3.5 - 5.0 g/dL Final  . AST 10/27/2015 38  15 - 41 U/L Final  . ALT 10/27/2015 47  14 - 54 U/L Final  . Alkaline Phosphatase 10/27/2015 79  38 - 126 U/L Final  . Total Bilirubin 10/27/2015 0.4  0.3 - 1.2 mg/dL Final  . GFR calc non Af Amer 10/27/2015 49* >60 mL/min Final  . GFR calc Af Amer 10/27/2015 56* >60 mL/min Final   Comment: (NOTE) The eGFR has been calculated using the CKD EPI equation. This calculation has not been validated in all clinical situations. eGFR's persistently <60 mL/min signify possible Chronic Kidney Disease.   . Anion gap 10/27/2015 8  5 - 15 Final  . Prothrombin Time 10/27/2015 13.0  11.4 - 15.2 seconds Final  . INR 10/27/2015 0.98   Final  . ABO/RH(D) 11/03/2015 O POS   Final  . Antibody Screen 11/03/2015 NEG   Final  . Sample Expiration 11/03/2015 11/06/2015   Final  . Extend sample reason 11/03/2015 NO TRANSFUSIONS OR PREGNANCY IN THE PAST 3 MONTHS   Final  . Color, Urine 10/27/2015 YELLOW  YELLOW Final  . APPearance 10/27/2015 CLEAR  CLEAR Final  . Specific Gravity, Urine 10/27/2015 1.016  1.005 - 1.030 Final  . pH 10/27/2015 7.0  5.0 - 8.0 Final  . Glucose, UA 10/27/2015 >1000* NEGATIVE mg/dL Final  . Hgb urine dipstick 10/27/2015 NEGATIVE  NEGATIVE Final  . Bilirubin Urine 10/27/2015 NEGATIVE  NEGATIVE Final  . Ketones, ur 10/27/2015 NEGATIVE  NEGATIVE mg/dL Final  . Protein, ur 10/27/2015 NEGATIVE  NEGATIVE mg/dL Final  . Nitrite 10/27/2015 NEGATIVE  NEGATIVE Final  . Leukocytes, UA 10/27/2015 MODERATE* NEGATIVE Final  . MRSA, PCR 10/27/2015 NEGATIVE  NEGATIVE Final  . Staphylococcus aureus 10/27/2015 NEGATIVE  NEGATIVE Final   Comment:        The Xpert SA Assay (FDA approved for NASAL specimens in patients over 45 years of age), is one component of a comprehensive surveillance program.  Test performance has been validated by [facility] for  patients greater than or equal to 43 year old. It is not intended to diagnose infection nor to guide or monitor treatment.   . Squamous Epithelial / LPF 10/27/2015 0-5* NONE SEEN Final  . WBC, UA 10/27/2015 0-5  0 - 5 WBC/hpf Final  . RBC / HPF 10/27/2015 0-5  0 - 5 RBC/hpf Final  . Bacteria, UA 10/27/2015 RARE* NONE SEEN Final  Appointment on 09/24/2015  Component Date Value Ref Range Status  .  Glu Hemocue Waived 09/24/2015 245* 65 - 99 mg/dL Final     X-Rays:No results found.  EKG: Orders placed or performed during the hospital encounter of 10/27/15  . EKG 12 lead  . EKG 12 lead     Hospital Course: Misty Cervantes is a 70 y.o. who was admitted to [facility]. They were brought to the operating room on 11/03/2015 and underwent Procedure(s): RIGHT TOTAL KNEE ARTHROPLASTY.  Patient tolerated the procedure well and was later transferred to the recovery room and then to the orthopaedic floor for postoperative care.  They were given PO and IV analgesics for pain control following their surgery.  They were given 24 hours of postoperative antibiotics of  Anti-infectives    Start     Dose/Rate Route Frequency Ordered Stop   11/03/15 1600  ceFAZolin (ANCEF) IVPB 2g/100 mL premix     2 g 200 mL/hr over 30 Minutes Intravenous Every 6 hours 11/03/15 1236 11/03/15 2223   11/03/15 0930  ceFAZolin (ANCEF) IVPB 2g/100 mL premix     2 g 200 mL/hr over 30 Minutes Intravenous  Once 11/03/15 0908 11/03/15 0925   11/03/15 0643  ceFAZolin (ANCEF) IVPB 2g/100 mL premix  Status:  Discontinued     2 g 200 mL/hr over 30 Minutes Intravenous On call to O.R. 11/03/15 2831 11/03/15 0729     and started on DVT prophylaxis in the form of Xarelto.   PT and OT were ordered for total joint protocol.  Discharge planning consulted to help with postop disposition and equipment needs.  Patient had a decent night on the evening of surgery and walked 40 feet day of surgery.  They started to get up OOB with  therapy on day one. Hemovac drain was pulled without difficulty.  Continued to work with therapy into day two.  Dressing was changed on day two and the incision was healing well.  Had some dizziness with ambulation so medications were changed and kept one more day.  By day three, the patient had progressed with therapy and meeting their goals.  Incision was healing well.  Patient was seen in rounds and was ready to go home.  Diet - Cardiac diet Follow up - in 2 weeks Activity - WBAT Disposition - Home Condition Upon Discharge - pending D/C Meds - See DC Summary DVT Prophylaxis - Xarelto    Medication List    STOP taking these medications   aspirin 81 MG chewable tablet   diclofenac 75 MG EC tablet Commonly known as:  VOLTAREN   HYDROcodone-acetaminophen 5-325 MG tablet Commonly known as:  NORCO/VICODIN   HYDROcodone-acetaminophen 5-500 MG tablet Commonly known as:  VICODIN   L-LYSINE HCL PO   multivitamin tablet     TAKE these medications   fluticasone 50 MCG/ACT nasal spray Commonly known as:  FLONASE Place 1 spray into both nostrils 2 (two) times daily as needed for allergies or rhinitis.   furosemide 20 MG tablet Commonly known as:  LASIX Take 1 tablet (20 mg total) by mouth daily. What changed:  when to take this  reasons to take this   methocarbamol 500 MG tablet Commonly known as:  ROBAXIN Take 1 tablet (500 mg total) by mouth every 6 (six) hours as needed for muscle spasms.   oxyCODONE 5 MG immediate release tablet Commonly known as:  Oxy IR/ROXICODONE Take 1-2 tablets (5-10 mg total) by mouth every 3 (three) hours as needed for moderate pain or severe pain.   pravastatin 40  MG tablet Commonly known as:  PRAVACHOL Take 1 tablet (40 mg total) by mouth daily.   rivaroxaban 10 MG Tabs tablet Commonly known as:  XARELTO Take 1 tablet (10 mg total) by mouth daily with breakfast. Take Xarelto for two and a half more weeks, then discontinue Xarelto. Once the  patient has completed the Xarelto, they may resume the 81 mg Aspirin.   traMADol 50 MG tablet Commonly known as:  ULTRAM Take 1-2 tablets (50-100 mg total) by mouth every 6 (six) hours as needed for moderate pain.   triamterene-hydrochlorothiazide 37.5-25 MG capsule Commonly known as:  DYAZIDE TAKE 1 CAPSULE EACH BY MOUTH DAILY.      Follow-up [facility] .   Why:  hospital bed Contact information: [address] [address] [place] .   Why:  now known as [facility]; will provide your home health physical therapy Contact information: [address] [place] [address] [phone]        Gearlean Alf, MD. Schedule an appointment as soon as possible for a visit on 11/18/2015.   Specialty:  Orthopedic Surgery Why:  Call office to setup appt on Tuesday 11/18/2015 Contact information: [address] [place] [address] [phone]           Signed: Arlee Muslim, PA-C Orthopaedic Surgery 11/04/2015, 10:31 PM

## 2015-11-04 NOTE — Discharge Instructions (Addendum)
° °Dr. Frank Aluisio °Total Joint Specialist °[facility] °[address] °[place], [place] [address] °[phone] ° °TOTAL KNEE REPLACEMENT POSTOPERATIVE DIRECTIONS ° °Knee Rehabilitation, Guidelines Following Surgery  °Results after knee surgery are often greatly improved when you follow the exercise, range of motion and muscle strengthening exercises prescribed by your doctor. Safety measures are also important to protect the knee from further injury. Any time any of these exercises cause you to have increased pain or swelling in your knee joint, decrease the amount until you are comfortable again and slowly increase them. If you have problems or questions, call your caregiver or physical therapist for advice.  ° °HOME CARE INSTRUCTIONS  °Remove items at home which could result in a fall. This includes throw rugs or furniture in walking pathways.  °· ICE to the affected knee every three hours for 30 minutes at a time and then as needed for pain and swelling.  Continue to use ice on the knee for pain and swelling from surgery. You may notice swelling that will progress down to the foot and ankle.  This is normal after surgery.  Elevate the leg when you are not up walking on it.   °· Continue to use the breathing machine which will help keep your temperature down.  It is common for your temperature to cycle up and down following surgery, especially at night when you are not up moving around and exerting yourself.  The breathing machine keeps your lungs expanded and your temperature down. °· Do not place pillow under knee, focus on keeping the knee straight while resting ° °DIET °You may resume your previous home diet once your are discharged from the hospital. ° °DRESSING / WOUND CARE / SHOWERING °You may shower 3 days after surgery, but keep the wounds dry during showering.  You may use an occlusive plastic wrap (Press'n Seal for example), NO SOAKING/SUBMERGING IN THE BATHTUB.  If the  bandage gets wet, change with a clean dry gauze.  If the incision gets wet, pat the wound dry with a clean towel. °You may start showering once you are discharged home but do not submerge the incision under water. Just pat the incision dry and apply a dry gauze dressing on daily. °Change the surgical dressing daily and reapply a dry dressing each time. ° °ACTIVITY °Walk with your walker as instructed. °Use walker as long as suggested by your caregivers. °Avoid periods of inactivity such as sitting longer than an hour when not asleep. This helps prevent blood clots.  °You may resume a sexual relationship in one month or when given the OK by your doctor.  °You may return to work once you are cleared by your doctor.  °Do not drive a car for 6 weeks or until released by you surgeon.  °Do not drive while taking narcotics. ° °WEIGHT BEARING °Weight bearing as tolerated with assist device (walker, cane, etc) as directed, use it as long as suggested by your surgeon or therapist, typically at least 4-6 weeks. ° °POSTOPERATIVE CONSTIPATION PROTOCOL °Constipation - defined medically as fewer than three stools per week and severe constipation as less than one stool per week. ° °One of the most common issues patients have following surgery is constipation.  Even if you have a regular bowel pattern at home, your normal regimen is likely to be disrupted due to multiple reasons following surgery.  Combination of anesthesia, postoperative narcotics, change in appetite and fluid intake all can affect your bowels.    In order to avoid complications following surgery, here are some recommendations in order to help you during your recovery period. ° °Colace (docusate) - Pick up an over-the-counter form of Colace or another stool softener and take twice a day as long as you are requiring postoperative pain medications.  Take with a full glass of water daily.  If you experience loose stools or diarrhea, hold the colace until you stool forms  back up.  If your symptoms do not get better within 1 week or if they get worse, check with your doctor. ° °Dulcolax (bisacodyl) - Pick up over-the-counter and take as directed by the product packaging as needed to assist with the movement of your bowels.  Take with a full glass of water.  Use this product as needed if not relieved by Colace only.  ° °MiraLax (polyethylene glycol) - Pick up over-the-counter to have on hand.  MiraLax is a solution that will increase the amount of water in your bowels to assist with bowel movements.  Take as directed and can mix with a glass of water, juice, soda, coffee, or tea.  Take if you go more than two days without a movement. °Do not use MiraLax more than once per day. Call your doctor if you are still constipated or irregular after using this medication for 7 days in a row. ° °If you continue to have problems with postoperative constipation, please contact the office for further assistance and recommendations.  If you experience "the worst abdominal pain ever" or develop nausea or vomiting, please contact the office immediatly for further recommendations for treatment. ° °ITCHING ° If you experience itching with your medications, try taking only a single pain pill, or even half a pain pill at a time.  You can also use Benadryl over the counter for itching or also to help with sleep.  ° °TED HOSE STOCKINGS °Wear the elastic stockings on both legs for three weeks following surgery during the day but you may remove then at night for sleeping. ° °MEDICATIONS °See your medication summary on the “After Visit Summary” that the nursing staff will review with you prior to discharge.  You may have some home medications which will be placed on hold until you complete the course of blood thinner medication.  It is important for you to complete the blood thinner medication as prescribed by your surgeon.  Continue your approved medications as instructed at time of  discharge. ° °PRECAUTIONS °If you experience chest pain or shortness of breath - call 911 immediately for transfer to the hospital emergency department.  °If you develop a fever greater that 101 F, purulent drainage from wound, increased redness or drainage from wound, foul odor from the wound/dressing, or calf pain - CONTACT YOUR SURGEON.   °                                                °FOLLOW-UP APPOINTMENTS °Make sure you keep all of your appointments after your operation with your surgeon and caregivers. You should call the office at the above phone number and make an appointment for approximately two weeks after the date of your surgery or on the date instructed by your surgeon outlined in the "After Visit Summary". ° ° °RANGE OF MOTION AND STRENGTHENING EXERCISES  °Rehabilitation of the knee is important following a knee injury or   an operation. After just a few days of immobilization, the muscles of the thigh which control the knee become weakened and shrink (atrophy). Knee exercises are designed to build up the tone and strength of the thigh muscles and to improve knee motion. Often times heat used for twenty to thirty minutes before working out will loosen up your tissues and help with improving the range of motion but do not use heat for the first two weeks following surgery. These exercises can be done on a training (exercise) mat, on the floor, on a table or on a bed. Use what ever works the best and is most comfortable for you Knee exercises include:  °Leg Lifts - While your knee is still immobilized in a splint or cast, you can do straight leg raises. Lift the leg to 60 degrees, hold for 3 sec, and slowly lower the leg. Repeat 10-20 times 2-3 times daily. Perform this exercise against resistance later as your knee gets better.  °Quad and Hamstring Sets - Tighten up the muscle on the front of the thigh (Quad) and hold for 5-10 sec. Repeat this 10-20 times hourly. Hamstring sets are done by pushing the  foot backward against an object and holding for 5-10 sec. Repeat as with quad sets.  °· Leg Slides: Lying on your back, slowly slide your foot toward your buttocks, bending your knee up off the floor (only go as far as is comfortable). Then slowly slide your foot back down until your leg is flat on the floor again. °· Angel Wings: Lying on your back spread your legs to the side as far apart as you can without causing discomfort.  °A rehabilitation program following serious knee injuries can speed recovery and prevent re-injury in the future due to weakened muscles. Contact your doctor or a physical therapist for more information on knee rehabilitation.  ° °IF YOU ARE TRANSFERRED TO A SKILLED REHAB FACILITY °If the patient is transferred to a skilled rehab facility following release from the hospital, a list of the current medications will be sent to the facility for the patient to continue.  When discharged from the skilled rehab facility, please have the facility set up the patient's Home Health Physical Therapy prior to being released. Also, the skilled facility will be responsible for providing the patient with their medications at time of release from the facility to include their pain medication, the muscle relaxants, and their blood thinner medication. If the patient is still at the rehab facility at time of the two week follow up appointment, the skilled rehab facility will also need to assist the patient in arranging follow up appointment in our office and any transportation needs. ° °MAKE SURE YOU:  °Understand these instructions.  °Get help right away if you are not doing well or get worse.  ° ° °Pick up stool softner and laxative for home use following surgery while on pain medications. °Do not submerge incision under water. °Please use good hand washing techniques while changing dressing each day. °May shower starting three days after surgery. °Please use a clean towel to pat the incision dry following  showers. °Continue to use ice for pain and swelling after surgery. °Do not use any lotions or creams on the incision until instructed by your surgeon. ° °Take Xarelto for two and a half more weeks, then discontinue Xarelto. °Once the patient has completed the Xarelto, they may resume the 81 mg Aspirin. ° ° °Information on my medicine - XARELTO® (Rivaroxaban) ° °  medication education was reviewed with me or my healthcare representative as part of my discharge preparation.  The pharmacist that spoke with me during my hospital stay was:  Michael D Li ° °Why was Xarelto® prescribed for you? °Xarelto® was prescribed for you to reduce the risk of blood clots forming after orthopedic surgery. The medical term for these abnormal blood clots is venous thromboembolism (VTE). ° °What do you need to know about xarelto® ? °Take your Xarelto® ONCE DAILY at the same time every day. °You may take it either with or without food. ° °If you have difficulty swallowing the tablet whole, you may crush it and mix in applesauce just prior to taking your dose. ° °Take Xarelto® exactly as prescribed by your doctor and DO NOT stop taking Xarelto® without talking to the doctor who prescribed the medication.  Stopping without other VTE prevention medication to take the place of Xarelto® may increase your risk of developing a clot. ° °After discharge, you should have regular check-up appointments with your healthcare provider that is prescribing your Xarelto®.   ° °What do you do if you miss a dose? °If you miss a dose, take it as soon as you remember on the same day then continue your regularly scheduled once daily regimen the next day. Do not take two doses of Xarelto® on the same day.  ° °Important Safety Information °A possible side effect of Xarelto® is bleeding. You should call your healthcare provider right away if you experience any of the following: °? Bleeding from an injury or your nose that does not stop. °? Unusual colored urine  (red or dark brown) or unusual colored stools (red or black). °? Unusual bruising for unknown reasons. °? A serious fall or if you hit your head (even if there is no bleeding). ° °Some medicines may interact with Xarelto® and might increase your risk of bleeding while on Xarelto®. To help avoid this, consult your healthcare provider or pharmacist prior to using any new prescription or non-prescription medications, including herbals, vitamins, non-steroidal anti-inflammatory drugs (NSAIDs) and supplements. ° °This website has more information on Xarelto®: [email or web address].xarelto.com. ° ° ° °

## 2015-11-04 NOTE — Progress Notes (Signed)
Physical Therapy Treatment Patient Details Name: Misty Cervantes MRN: [id number] DOB: 1945/04/03 Today's Date: 11/04/2015    History of Present Illness s/p RTKA; PMHx: L THA    PT Comments    POD # 1 am session Applied KI and instructed on use for amb and stairs for increased support.  Amb to bathroom to void then in hallway a limited distance.  Assisted back to bed per pt request to rest.  Performed some TKR TE's followed by ICE.    Follow Up Recommendations  Home health PT     Equipment Recommendations  None recommended by PT    Recommendations for Other Services       Precautions / Restrictions Precautions Precautions: Fall;Knee Precaution Comments: instructed on KI use for amb and stairs Required Braces or Orthoses: Knee Immobilizer - Right Knee Immobilizer - Right: Discontinue once straight leg raise with < 10 degree lag Restrictions Weight Bearing Restrictions: No RLE Weight Bearing: Weight bearing as tolerated    Mobility  Bed Mobility Overal bed mobility: Needs Assistance Bed Mobility: Sit to Supine           General bed mobility comments: assisted back to bed Min Assist for R LE  Transfers Overall transfer level: Needs assistance Equipment used: Rolling walker (2 wheeled) Transfers: Sit to/from Stand Sit to Stand: Min guard;Min assist         General transfer comment: 50% VC's on proper tech and handplacement  Ambulation/Gait Ambulation/Gait assistance: Min guard;Min assist Ambulation Distance (Feet): 28 Feet Assistive device: Rolling walker (2 wheeled) Gait Pattern/deviations: Step-to pattern;Decreased stance time - right Gait velocity: decreased   General Gait Details: increased, increased time and 25% VC's on proper sequencing and walker to self distance   Science writer    Modified Rankin (Stroke Patients Only)       Balance                                    Cognition  Arousal/Alertness: Awake/alert Behavior During Therapy: WFL for tasks assessed/performed Overall Cognitive Status: Within Functional Limits for tasks assessed                      Exercises   Total Knee Replacement TE's 10 reps B LE ankle pumps 10 reps towel squeezes 10 reps knee presses 10 reps heel slides  10 reps SLR's 10 reps ABD Followed by ICE     General Comments        Pertinent Vitals/Pain Pain Assessment: 0-10 Pain Score: 5  Pain Location: R knee Pain Descriptors / Indicators: Sore Pain Intervention(s): Monitored during session;Repositioned;Ice applied    Home Living                      Prior Function            PT Goals (current goals can now be found in the care plan section) Progress towards PT goals: Progressing toward goals    Frequency  7X/week    PT Plan Current plan remains appropriate    Co-evaluation             End of Session Equipment Utilized During Treatment: Gait belt;Right knee immobilizer Activity Tolerance: Patient tolerated treatment well Patient left: in bed;with call bell/phone within reach;with bed alarm set     Time: [id number]  PT Time Calculation (min) (ACUTE ONLY): 26 min  Charges:  $Gait Training: 8-22 mins $Therapeutic Activity: 8-22 mins                    G Codes:      Rica Koyanagi  PTA [facility]  Acute  Rehab Pager      [phone]

## 2015-11-04 NOTE — Care Management Note (Signed)
Case Management Note  Patient Details  Name: Misty Cervantes MRN: [id number] Date of Birth: Jun 01, 1945  Subjective/Objective:                  RIGHT TOTAL KNEE ARTHROPLASTY (Right) Action/Plan: Discharge planning Expected Discharge Date:                  Expected Discharge Plan:  [place]  In-House Referral:     Discharge planning Services  CM Consult  Post Acute Care Choice:  Durable Medical Equipment, Home Health Choice offered to:     DME Arranged:  Hospital bed DME Agency:  [place]:  PT [place]:  [facility] ([facility])  Status of Service:  Completed, signed off  If discussed at [employer] of Stay Meetings, dates discussed:    Additional Comments: CM met with pt in room to offer choice of home health agency. Pt chooses [facility] to render HHPT.  CM notified [place] DME rep, Jermaine to please arrange for hospital bed.  Referral given to [facility] rep.  NO other CM needs were communicated. Dellie Catholic, RN 11/04/2015, 3:12 PM

## 2015-11-04 NOTE — Progress Notes (Signed)
Physical Therapy Treatment Patient Details Name: Misty Cervantes MRN: [id number] DOB: [birth year] Today's Date: 11/04/2015    History of Present Illness s/p RTKA; PMHx: L THA    PT Comments    POD # 1 pm session Assisted OOB to amb a second time an increased distance then assisted back to bed for CPM. Pt worried about getting upstairs to her bedroom.  Instructed her that we would practice stairs in the am before discharge.   Follow Up Recommendations  Home health PT     Equipment Recommendations  None recommended by PT    Recommendations for Other Services       Precautions / Restrictions Precautions Precautions: Fall;Knee Precaution Comments: instructed on KI use for amb and stairs Required Braces or Orthoses: Knee Immobilizer - Right Knee Immobilizer - Right: Discontinue once straight leg raise with < 10 degree lag Restrictions Weight Bearing Restrictions: No RLE Weight Bearing: Weight bearing as tolerated    Mobility  Bed Mobility Overal bed mobility: Needs Assistance Bed Mobility: Sit to Supine;Supine to Sit           General bed mobility comments: assisted OOB and back to bed with increased time  Transfers Overall transfer level: Needs assistance Equipment used: Rolling walker (2 wheeled) Transfers: Sit to/from Stand Sit to Stand: Min guard;Min assist         General transfer comment: 50% VC's on proper tech and handplacement  Ambulation/Gait Ambulation/Gait assistance: Min guard;Min assist Ambulation Distance (Feet): 34 Feet Assistive device: Rolling walker (2 wheeled) Gait Pattern/deviations: Step-to pattern;Decreased stance time - right Gait velocity: decreased   General Gait Details: increased, increased time and 25% VC's on proper sequencing and walker to self distance   Stairs            Wheelchair Mobility    Modified Rankin (Stroke Patients Only)       Balance                                    Cognition  Arousal/Alertness: Awake/alert Behavior During Therapy: WFL for tasks assessed/performed Overall Cognitive Status: Within Functional Limits for tasks assessed                      Exercises      General Comments        Pertinent Vitals/Pain Pain Assessment: 0-10 Pain Score: 5  Pain Location: R knee Pain Descriptors / Indicators: Sore Pain Intervention(s): Monitored during session;Repositioned;Ice applied    Home Living                      Prior Function            PT Goals (current goals can now be found in the care plan section) Progress towards PT goals: Progressing toward goals    Frequency  7X/week    PT Plan Current plan remains appropriate    Co-evaluation             End of Session Equipment Utilized During Treatment: Gait belt;Right knee immobilizer Activity Tolerance: Patient tolerated treatment well Patient left: in bed;with call bell/phone within reach;with bed alarm set     Time: [id number] PT Time Calculation (min) (ACUTE ONLY): 27 min  Charges:  $Gait Training: 8-22 mins $Therapeutic Activity: 8-22 mins  G Codes:      Rica Koyanagi  PTA [facility]  Acute  Rehab Pager      [phone]

## 2015-11-05 LAB — CBC
HCT: 33.3 % — ABNORMAL LOW (ref 36.0–46.0)
Hemoglobin: 11.5 g/dL — ABNORMAL LOW (ref 12.0–15.0)
MCH: 31.5 pg (ref 26.0–34.0)
MCHC: 34.5 g/dL (ref 30.0–36.0)
MCV: 91.2 fL (ref 78.0–100.0)
PLATELETS: 228 10*3/uL (ref 150–400)
RBC: 3.65 MIL/uL — AB (ref 3.87–5.11)
RDW: 12.4 % (ref 11.5–15.5)
WBC: 9.9 10*3/uL (ref 4.0–10.5)

## 2015-11-05 LAB — BASIC METABOLIC PANEL
ANION GAP: 9 (ref 5–15)
BUN: 25 mg/dL — ABNORMAL HIGH (ref 6–20)
CALCIUM: 8.7 mg/dL — AB (ref 8.9–10.3)
CO2: 26 mmol/L (ref 22–32)
Chloride: 98 mmol/L — ABNORMAL LOW (ref 101–111)
Creatinine, Ser: 0.91 mg/dL (ref 0.44–1.00)
Glucose, Bld: 284 mg/dL — ABNORMAL HIGH (ref 65–99)
POTASSIUM: 3.9 mmol/L (ref 3.5–5.1)
Sodium: 133 mmol/L — ABNORMAL LOW (ref 135–145)

## 2015-11-05 MED ORDER — TRAMADOL HCL 50 MG PO TABS: 50.0000 mg | ORAL_TABLET | Freq: Four times a day (QID) | ORAL | 1 refills | Status: DC | PRN

## 2015-11-05 MED ORDER — HYDROCODONE-ACETAMINOPHEN 5-325 MG PO TABS
1.0000 | ORAL_TABLET | ORAL | Status: DC | PRN
  Administered 2015-11-05 – 2015-11-06 (×6): 2 via ORAL
  Filled 2015-11-05 (×6): qty 2

## 2015-11-05 MED ORDER — HYDROCODONE-ACETAMINOPHEN 5-325 MG PO TABS: 1.0000 | ORAL_TABLET | ORAL | 0 refills | Status: DC | PRN

## 2015-11-05 MED ORDER — METHOCARBAMOL 500 MG PO TABS: 500.0000 mg | ORAL_TABLET | Freq: Four times a day (QID) | ORAL | 0 refills | Status: DC | PRN

## 2015-11-05 MED ORDER — RIVAROXABAN 10 MG PO TABS: 10.0000 mg | ORAL_TABLET | Freq: Every day | ORAL | 0 refills | Status: DC

## 2015-11-05 NOTE — Progress Notes (Signed)
Physical Therapy Treatment Patient Details Name: Misty Cervantes MRN: [id number] DOB: July 08, 1945 Today's Date: 11/05/2015    History of Present Illness s/p RTKA; PMHx: L THA    PT Comments    POD # 2 am session Pt having a rough morning with increased pain and increased c/o dizziness.  Limited amb distance.  Assisted back to bed.  Will reattempt later in day.  Follow Up Recommendations  Home health PT     Equipment Recommendations  None recommended by PT    Recommendations for Other Services       Precautions / Restrictions Precautions Precautions: Fall;Knee Restrictions Weight Bearing Restrictions: No RLE Weight Bearing: Weight bearing as tolerated    Mobility  Bed Mobility Overal bed mobility: Needs Assistance Bed Mobility: Sit to Supine       Sit to supine: Min assist   General bed mobility comments: assist for RLE  Transfers Overall transfer level: Needs assistance Equipment used: Rolling walker (2 wheeled) Transfers: Sit to/from Stand Sit to Stand: Min guard         General transfer comment: for safety. Cues for UE placement  Ambulation/Gait Ambulation/Gait assistance: Min guard Ambulation Distance (Feet): 8 Feet Assistive device: Rolling walker (2 wheeled) Gait Pattern/deviations: Step-to pattern Gait velocity: decreased   General Gait Details: decreased amb distance due to increased c/o dizziness and fatigue.      Stairs            Wheelchair Mobility    Modified Rankin (Stroke Patients Only)       Balance                                    Cognition                            Exercises      General Comments        Pertinent Vitals/Pain Pain Assessment: 0-10    Home Living                      Prior Function            PT Goals (current goals can now be found in the care plan section) Progress towards PT goals: Progressing toward goals    Frequency  7X/week    PT Plan  Current plan remains appropriate    Co-evaluation             End of Session Equipment Utilized During Treatment: Gait belt Activity Tolerance: Other (comment) Patient left: in bed;with call bell/phone within reach     Time: [id number] PT Time Calculation (min) (ACUTE ONLY): 27 min  Charges:  $Gait Training: 8-22 mins $Therapeutic Activity: 8-22 mins                    G Codes:      Rica Koyanagi  PTA [facility]  Acute  Rehab Pager      [phone]

## 2015-11-05 NOTE — Progress Notes (Signed)
Physical Therapy Treatment Patient Details Name: Misty Cervantes MRN: [id number] DOB: 23-Nov-1945 Today's Date: 11/05/2015    History of Present Illness s/p RTKA; PMHx: L THA    PT Comments    POD # 2 pm session Pt performed slightly better but was unable to attempt stairs.  Pt having increased c/o pain and dizziness.   Follow Up Recommendations  Home health PT     Equipment Recommendations  None recommended by PT    Recommendations for Other Services       Precautions / Restrictions Precautions Precautions: Fall;Knee Restrictions Weight Bearing Restrictions: No RLE Weight Bearing: Weight bearing as tolerated    Mobility  Bed Mobility Overal bed mobility: Needs Assistance Bed Mobility: Supine to Sit;Sit to Supine     Supine to sit: Min assist Sit to supine: Min assist   General bed mobility comments: assist for RLE OOB and back onto bed due to pain level  Transfers Overall transfer level: Needs assistance Equipment used: Rolling walker (2 wheeled) Transfers: Sit to/from Stand Sit to Stand: Min guard         General transfer comment: for safety. Cues for UE placement  Ambulation/Gait Ambulation/Gait assistance: Min guard Ambulation Distance (Feet): 18 Feet Assistive device: Rolling walker (2 wheeled) Gait Pattern/deviations: Step-to pattern Gait velocity: decreased   General Gait Details: tolerated increased distance however still unsteady with c/o fatigue   Stairs Stairs:  (unable to attempt due to pt's present condition)          Wheelchair Mobility    Modified Rankin (Stroke Patients Only)       Balance                                    Cognition                            Exercises   Total Knee Replacement TE's 10 reps B LE ankle pumps 10 reps towel squeezes 10 reps knee presses 10 reps heel slides  10 reps SAQ's 10 reps SLR's 10 reps ABD Followed by ICE     General Comments         Pertinent Vitals/Pain Pain Assessment: 0-10    Home Living                      Prior Function            PT Goals (current goals can now be found in the care plan section) Progress towards PT goals: Progressing toward goals    Frequency  7X/week    PT Plan Current plan remains appropriate    Co-evaluation             End of Session Equipment Utilized During Treatment: Gait belt Activity Tolerance: Other (comment) Patient left: in bed;with call bell/phone within reach     Time: [id number] PT Time Calculation (min) (ACUTE ONLY): 26 min  Charges:  $Gait Training: 8-22 mins $Therapeutic Exercise: 8-22 mins                    G Codes:      Rica Koyanagi  PTA [facility]  Acute  Rehab Pager      [phone]

## 2015-11-05 NOTE — Progress Notes (Signed)
Occupational Therapy Treatment Patient Details Name: Misty Cervantes MRN: [id number] DOB: 1945/06/27 Today's Date: 11/05/2015    History of present illness s/p RTKA; PMHx: L THA   OT comments  Pt walked to bathroom; limited by nausea this session.  BP 142/66 sitting  Follow Up Recommendations  Supervision/Assistance - 24 hour    Equipment Recommendations  None recommended by OT    Recommendations for Other Services      Precautions / Restrictions Precautions Precautions: Fall;Knee Restrictions Weight Bearing Restrictions: No RLE Weight Bearing: Weight bearing as tolerated       Mobility Bed Mobility         Supine to sit: Min assist     General bed mobility comments: assist for RLE. Pt felt very stiff  Transfers   Equipment used: Rolling walker (2 wheeled) Transfers: Sit to/from Stand Sit to Stand: Min guard         General transfer comment: for safety. Cues for UE placement    Balance                                   ADL                           Toilet Transfer: Min guard;Ambulation;Comfort height toilet;Grab bars;Requires wide/bariatric   Toileting- Clothing Manipulation and Hygiene: Min guard;Sit to/from stand         General ADL Comments: ambulated to bathroom and pt felt nauseous which got worse.  Sat on commode and used this.  RN brought IV medication.  Pt unable to tolerate standing at sink at this time.      Vision                     Perception     Praxis      Cognition   Behavior During Therapy: WFL for tasks assessed/performed Overall Cognitive Status: Within Functional Limits for tasks assessed                       Extremity/Trunk Assessment               Exercises     Shoulder Instructions       General Comments      Pertinent Vitals/ Pain       Pain Assessment: 0-10 Pain Score: 6  Pain Location: R thigh Pain Descriptors / Indicators: Aching;Sore Pain  Intervention(s): Limited activity within patient's tolerance;Monitored during session;Premedicated before session;Repositioned;Ice applied  Home Living                                          Prior Functioning/Environment              Frequency       Progress Toward Goals  OT Goals(current goals can now be found in the care plan section)  Progress towards OT goals: Goals met/education completed, patient discharged from OT (did not meet grooming goal; no further OT needs)  Acute Rehab OT Goals Patient Stated Goal: return to [place], be able to take care of her grandchildren  Plan      Co-evaluation                 End of Session CPM Right Knee  CPM Right Knee: Off   Activity Tolerance  (limited by nausea)   Patient Left in chair;with call bell/phone within reach;with chair alarm set   Nurse Communication          Time: [phone] OT Time Calculation (min): 25 min  Charges: OT General Charges $OT Visit: 1 Procedure OT Treatments $Self Care/Home Management : 8-22 mins  Shalee Paolo 11/05/2015, 8:56 AM Lesle Chris, OTR/L [phone] 11/05/2015

## 2015-11-05 NOTE — Progress Notes (Signed)
   Subjective: 2 Days Post-Op Procedure(s) (LRB): RIGHT TOTAL KNEE ARTHROPLASTY (Right) Patient reports pain as mild.   Patient seen in rounds for Dr. Wynelle Link.  Dizzy this morning up to bathroom.  HGB and BP okay.  Questionable pain meds. Will change oral meds and see how she does with that. Patient is well, but has had some minor complaints of pain in the knee, requiring pain medications Plan is to go home but will see how she does this afternoon following the change in the medication.  Objective: Vital signs in last 24 hours: Temp:  [97.8 F (36.6 C)-98.7 F (37.1 C)] 98.7 F (37.1 C) (08/22 2303) Pulse Rate:  [77-89] 78 (08/23 0753) Resp:  [15-16] 16 (08/22 2303) BP: (145-182)/(72-78) 145/74 (08/23 0753) SpO2:  [94 %-98 %] 94 % (08/22 2303)  Intake/Output from previous day:  Intake/Output Summary (Last 24 hours) at 11/05/15 1206 Last data filed at 11/05/15 0900  Gross per 24 hour  Intake             1335 ml  Output              700 ml  Net              635 ml    Intake/Output this shift: Total I/O In: 120 [P.O.:120] Out: -   Labs:  Recent Labs  11/04/15 0417 11/05/15 0418  HGB 11.4* 11.5*    Recent Labs  11/04/15 0417 11/05/15 0418  WBC 9.0 9.9  RBC 3.64* 3.65*  HCT 33.9* 33.3*  PLT 207 228    Recent Labs  11/04/15 0417 11/05/15 0418  NA 133* 133*  K 3.8 3.9  CL 98* 98*  CO2 26 26  BUN 22* 25*  CREATININE 1.12* 0.91  GLUCOSE 319* 284*  CALCIUM 8.2* 8.7*   No results for input(s): LABPT, INR in the last 72 hours.  EXAM: General - Patient is Alert, Appropriate and Oriented Extremity - Neurovascular intact Sensation intact distally Dorsiflexion/Plantar flexion intact Incision - clean, dry, no drainage Motor Function - intact, moving foot and toes well on exam.   Assessment/Plan: 2 Days Post-Op Procedure(s) (LRB): RIGHT TOTAL KNEE ARTHROPLASTY (Right) Procedure(s) (LRB): RIGHT TOTAL KNEE ARTHROPLASTY (Right) Past Medical History:    Diagnosis Date  . Anemia   . Cataract   . Gastric ulcer 11-17-11   egd  . Headache    migraines  . History of bronchitis   . History of urinary tract infection   . HTN (hypertension)   . Hypercholesterolemia   . Hyperlipemia   . Hyperplastic polyp of intestine 11-17-11   colonoscopy  . Hypertension   . Measles   . Menopause   . Osteoarthritis   . Pneumonia    Principal Problem:   OA (osteoarthritis) of knee  Estimated body mass index is 30.48 kg/m as calculated from the following:   Height as of this encounter: 5' (1.524 m).   Weight as of this encounter: 70.8 kg (156 lb 1 oz). Up with therapy Discharge home with home health Diet - Cardiac diet Follow up - in 2 weeks Activity - WBAT Disposition - Home Condition Upon Discharge - pending D/C Meds - See DC Summary DVT Prophylaxis - [place], PA-C Orthopaedic Surgery 11/05/2015, 12:06 PM

## 2015-11-06 LAB — CBC
HEMATOCRIT: 34.5 % — AB (ref 36.0–46.0)
HEMOGLOBIN: 11.5 g/dL — AB (ref 12.0–15.0)
MCH: 31.1 pg (ref 26.0–34.0)
MCHC: 33.3 g/dL (ref 30.0–36.0)
MCV: 93.2 fL (ref 78.0–100.0)
Platelets: 230 10*3/uL (ref 150–400)
RBC: 3.7 MIL/uL — ABNORMAL LOW (ref 3.87–5.11)
RDW: 12.7 % (ref 11.5–15.5)
WBC: 7.2 10*3/uL (ref 4.0–10.5)

## 2015-11-06 NOTE — Progress Notes (Signed)
Physical Therapy Treatment Patient Details Name: Misty Cervantes MRN: [id number] DOB: 03/16/1945 Today's Date: 11/06/2015    History of Present Illness s/p RTKA; PMHx: L THA    PT Comments    Improved mobility and activity tolerance on today. Pt denied dizziness during session. Practiced exercises, gait training, and stair training. All education completed. Ready to d/c from PT standpoint.   Follow Up Recommendations  Home health PT     Equipment Recommendations  None recommended by PT    Recommendations for Other Services       Precautions / Restrictions Precautions Precautions: Fall;Knee Precaution Comments: instructed on KI use for amb and stairs Required Braces or Orthoses: Knee Immobilizer - Right Knee Immobilizer - Right: Discontinue once straight leg raise with < 10 degree lag Restrictions Weight Bearing Restrictions: No RLE Weight Bearing: Weight bearing as tolerated    Mobility  Bed Mobility Overal bed mobility: Needs Assistance Bed Mobility: Supine to Sit     Supine to sit: Min assist     General bed mobility comments: Assist for R LE  Transfers Overall transfer level: Needs assistance Equipment used: Rolling walker (2 wheeled) Transfers: Sit to/from Stand Sit to Stand: Min guard         General transfer comment: close guard for safety. VCs hand/LE placement  Ambulation/Gait Ambulation/Gait assistance: Min guard Ambulation Distance (Feet): 75 Feet Assistive device: Rolling walker (2 wheeled) Gait Pattern/deviations: Step-to pattern;Antalgic     General Gait Details: close guard for safety. slow but steady gait speed.    Stairs Stairs: Yes Stairs assistance: Min assist Stair Management: Step to pattern;Forwards Number of Stairs: 1 General stair comments: Assist to stabilize pt. VCs safety, technique. Some difficulty due to pain but pt was able to complete task.   Wheelchair Mobility    Modified Rankin (Stroke Patients Only)        Balance                                    Cognition Arousal/Alertness: Awake/alert Behavior During Therapy: WFL for tasks assessed/performed Overall Cognitive Status: Within Functional Limits for tasks assessed                      Exercises Total Joint Exercises Ankle Circles/Pumps: AROM;Both;10 reps Quad Sets: AROM;10 reps;Both Heel Slides: AAROM;10 reps;Supine;Right Hip ABduction/ADduction: AAROM;Right;10 reps;Supine Straight Leg Raises: AAROM;Right;10 reps;Supine Goniometric ROM: ~10-70 degrees    General Comments        Pertinent Vitals/Pain Pain Assessment: 0-10 Pain Score: 7  Pain Location: R LE Pain Descriptors / Indicators: Aching;Sore Pain Intervention(s): Limited activity within patient's tolerance;Ice applied;Repositioned    Home Living                      Prior Function            PT Goals (current goals can now be found in the care plan section) Progress towards PT goals: Progressing toward goals    Frequency  7X/week    PT Plan Current plan remains appropriate    Co-evaluation             End of Session Equipment Utilized During Treatment: Gait belt;Right knee immobilizer Activity Tolerance: Patient tolerated treatment well Patient left: in chair;with call bell/phone within reach     Time: [id number] PT Time Calculation (min) (ACUTE ONLY): 23 min  Charges:  $Gait Training: 8-22  mins $Therapeutic Exercise: 8-22 mins                    G Codes:      Weston Anna, MPT Pager: [phone]

## 2015-11-06 NOTE — Progress Notes (Signed)
   Subjective: 3 Days Post-Op Procedure(s) (LRB): RIGHT TOTAL KNEE ARTHROPLASTY (Right) Patient reports pain as mild.  Did a lot better last night. Pain well controlled. Dizziness resolved Plan is to go Home after hospital stay.  Objective: Vital signs in last 24 hours: Temp:  [98.3 F (36.8 C)-98.9 F (37.2 C)] 98.6 F (37 C) (08/24 0603) Pulse Rate:  [78-88] 88 (08/24 0603) Resp:  [16] 16 (08/24 0603) BP: (130-152)/(69-75) 152/75 (08/24 0603) SpO2:  [90 %-94 %] 94 % (08/24 0603)  Intake/Output from previous day:  Intake/Output Summary (Last 24 hours) at 11/06/15 0649 Last data filed at 11/06/15 0616  Gross per 24 hour  Intake             1110 ml  Output                0 ml  Net             1110 ml    Intake/Output this shift: Total I/O In: 750 [P.O.:750] Out: -   Labs:  Recent Labs  11/04/15 0417 11/05/15 0418 11/06/15 0434  HGB 11.4* 11.5* 11.5*    Recent Labs  11/05/15 0418 11/06/15 0434  WBC 9.9 7.2  RBC 3.65* 3.70*  HCT 33.3* 34.5*  PLT 228 230    Recent Labs  11/04/15 0417 11/05/15 0418  NA 133* 133*  K 3.8 3.9  CL 98* 98*  CO2 26 26  BUN 22* 25*  CREATININE 1.12* 0.91  GLUCOSE 319* 284*  CALCIUM 8.2* 8.7*   No results for input(s): LABPT, INR in the last 72 hours.  EXAM General - Patient is Alert, Appropriate and Oriented Extremity - Neurologically intact Neurovascular intact Incision: dressing C/D/I No cellulitis present Compartment soft Dressing/Incision - clean, dry, no drainage Motor Function - intact, moving foot and toes well on exam.   Past Medical History:  Diagnosis Date  . Anemia   . Cataract   . Gastric ulcer 11-17-11   egd  . Headache    migraines  . History of bronchitis   . History of urinary tract infection   . HTN (hypertension)   . Hypercholesterolemia   . Hyperlipemia   . Hyperplastic polyp of intestine 11-17-11   colonoscopy  . Hypertension   . Measles   . Menopause   . Osteoarthritis   . Pneumonia      Assessment/Plan: 3 Days Post-Op Procedure(s) (LRB): RIGHT TOTAL KNEE ARTHROPLASTY (Right) Principal Problem:   OA (osteoarthritis) of knee   Discharge home with home health  DVT Prophylaxis - Xarelto Weight-Bearing as tolerated to right leg  Trygg Mantz V 11/06/2015, 6:49 AM

## 2016-02-17 ENCOUNTER — Other Ambulatory Visit: Payer: Self-pay | Admitting: Family Medicine

## 2016-02-17 DIAGNOSIS — N6310 Unspecified lump in the right breast, unspecified quadrant: Secondary | ICD-10-CM

## 2016-04-09 ENCOUNTER — Telehealth: Payer: Self-pay

## 2016-04-09 DIAGNOSIS — N6310 Unspecified lump in the right breast, unspecified quadrant: Secondary | ICD-10-CM

## 2016-04-09 NOTE — Telephone Encounter (Signed)
Orders placed.

## 2016-04-12 ENCOUNTER — Other Ambulatory Visit: Payer: No Typology Code available for payment source

## 2016-04-12 ENCOUNTER — Ambulatory Visit
Admission: RE | Admit: 2016-04-12 | Discharge: 2016-04-12 | Disposition: A | Discharge status: Home / Self Care | Payer: No Typology Code available for payment source | Source: Ambulatory Visit | Attending: Family Medicine | Admitting: Family Medicine

## 2016-04-12 DIAGNOSIS — N6310 Unspecified lump in the right breast, unspecified quadrant: Secondary | ICD-10-CM

## 2016-04-19 ENCOUNTER — Other Ambulatory Visit: Payer: Self-pay | Admitting: Family Medicine

## 2016-06-17 ENCOUNTER — Encounter: Payer: Self-pay | Admitting: Family Medicine

## 2016-06-17 ENCOUNTER — Ambulatory Visit (INDEPENDENT_AMBULATORY_CARE_PROVIDER_SITE_OTHER): Payer: Medicare Other | Admitting: Family Medicine

## 2016-06-17 VITALS — BP 114/68 | Temp 98.2°F | Ht 60.5 in | Wt 153.8 lb

## 2016-06-17 DIAGNOSIS — R300 Dysuria: Secondary | ICD-10-CM | POA: Diagnosis not present

## 2016-06-17 DIAGNOSIS — H6981 Other specified disorders of Eustachian tube, right ear: Secondary | ICD-10-CM | POA: Diagnosis not present

## 2016-06-17 DIAGNOSIS — R42 Dizziness and giddiness: Secondary | ICD-10-CM

## 2016-06-17 DIAGNOSIS — Z96651 Presence of right artificial knee joint: Secondary | ICD-10-CM | POA: Insufficient documentation

## 2016-06-17 DIAGNOSIS — R7303 Prediabetes: Secondary | ICD-10-CM

## 2016-06-17 DIAGNOSIS — Z90710 Acquired absence of both cervix and uterus: Secondary | ICD-10-CM | POA: Insufficient documentation

## 2016-06-17 DIAGNOSIS — N3000 Acute cystitis without hematuria: Secondary | ICD-10-CM

## 2016-06-17 DIAGNOSIS — H6991 Unspecified Eustachian tube disorder, right ear: Secondary | ICD-10-CM

## 2016-06-17 DIAGNOSIS — Z96642 Presence of left artificial hip joint: Secondary | ICD-10-CM | POA: Insufficient documentation

## 2016-06-17 LAB — COMPREHENSIVE METABOLIC PANEL
ALT: 29 U/L (ref 0–35)
AST: 22 U/L (ref 0–37)
Albumin: 4.4 g/dL (ref 3.5–5.2)
Alkaline Phosphatase: 89 U/L (ref 39–117)
BUN: 21 mg/dL (ref 6–23)
CO2: 32 mEq/L (ref 19–32)
Calcium: 9.9 mg/dL (ref 8.4–10.5)
Chloride: 93 mEq/L — ABNORMAL LOW (ref 96–112)
Creatinine, Ser: 1.16 mg/dL (ref 0.40–1.20)
GFR: 48.88 mL/min — ABNORMAL LOW (ref >60.00)
Glucose, Bld: 471 mg/dL — ABNORMAL HIGH (ref 70–99)
Potassium: 3.3 mEq/L — ABNORMAL LOW (ref 3.5–5.1)
Sodium: 133 mEq/L — ABNORMAL LOW (ref 135–145)
Total Bilirubin: 0.6 mg/dL (ref 0.2–1.2)
Total Protein: 7.2 g/dL (ref 6.0–8.3)

## 2016-06-17 LAB — CBC WITH DIFFERENTIAL/PLATELET
Basophils Absolute: 0 10*3/uL (ref 0.0–0.1)
Basophils Relative: 0.6 % (ref 0.0–3.0)
Eosinophils Absolute: 0.1 10*3/uL (ref 0.0–0.7)
Eosinophils Relative: 1.3 % (ref 0.0–5.0)
HCT: 42.7 % (ref 36.0–46.0)
Hemoglobin: 14.4 g/dL (ref 12.0–15.0)
Lymphocytes Relative: 20.5 % (ref 12.0–46.0)
Lymphs Abs: 0.9 10*3/uL (ref 0.7–4.0)
MCHC: 33.8 g/dL (ref 30.0–36.0)
MCV: 91.7 fl (ref 78.0–100.0)
Monocytes Absolute: 0.4 10*3/uL (ref 0.1–1.0)
Monocytes Relative: 8 % (ref 3.0–12.0)
Neutro Abs: 3.1 10*3/uL (ref 1.4–7.7)
Neutrophils Relative %: 69.6 % (ref 43.0–77.0)
Platelets: 225 10*3/uL (ref 150.0–400.0)
RBC: 4.66 Mil/uL (ref 3.87–5.11)
RDW: 13.1 % (ref 11.5–15.5)
WBC: 4.5 10*3/uL (ref 4.0–10.5)

## 2016-06-17 LAB — POC URINALSYSI DIPSTICK (AUTOMATED)
Bilirubin, UA: NEGATIVE
Blood, UA: 10
Glucose, UA: 1000
Ketones, UA: NEGATIVE
Nitrite, UA: NEGATIVE
Protein, UA: 15
Spec Grav, UA: 1.015 (ref 1.030–1.035)
Urobilinogen, UA: 0.2 (ref ?–2.0)
pH, UA: 6 (ref 5.0–8.0)

## 2016-06-17 LAB — HEMOGLOBIN A1C: Hgb A1c MFr Bld: 10.6 % — ABNORMAL HIGH (ref 4.6–6.5)

## 2016-06-17 MED ORDER — NITROFURANTOIN MONOHYD MACRO 100 MG PO CAPS: 100.0000 mg | ORAL_CAPSULE | Freq: Two times a day (BID) | ORAL | 0 refills | Status: DC

## 2016-06-17 MED ORDER — MECLIZINE HCL 25 MG PO TABS: 25.0000 mg | ORAL_TABLET | Freq: Three times a day (TID) | ORAL | 0 refills | Status: DC | PRN

## 2016-06-17 NOTE — Progress Notes (Signed)
Misty Cervantes is a 71 y.o. female is here to [facility].   History of Present Illness:  Misty Cervantes, CMA, acting as scribe for Dr. Juleen China.  Dizziness  This is a new problem. The current episode started in the past 7 days. The problem occurs intermittently. Associated symptoms include coughing, headaches and nausea. Pertinent negatives include no abdominal pain, chest pain, chills, congestion, fever, neck pain, sore throat or vomiting. The symptoms are aggravated by standing and bending.  Otalgia   There is pain in the right ear. This is a recurrent problem. The current episode started in the past 7 days. The problem occurs every few hours. There has been no fever. The pain is mild. Associated symptoms include coughing and headaches. Pertinent negatives include no abdominal pain, diarrhea, neck pain, sore throat or vomiting. She has tried nothing for the symptoms.  Dysuria   This is a new problem. The current episode started in the past 7 days. The problem has been gradually worsening. The quality of the pain is described as burning. There has been no fever. She is not sexually active. There is no history of pyelonephritis. Associated symptoms include frequency, nausea and urgency. Pertinent negatives include no chills, discharge, flank pain, hematuria or vomiting.   Health Maintenance Due  Topic Date Due  . Hepatitis C Screening  12-Apr-1945  . FOOT EXAM  12/31/1954  . OPHTHALMOLOGY EXAM  12/31/1954  . URINE MICROALBUMIN  12/31/1954  . TETANUS/TDAP  12/31/1963  . PNA vac Low Risk Adult (2 of 2 - PCV13) 01/01/2012    PMHx, SurgHx, SocialHx, Medications, and Allergies were reviewed in the Visit Navigator and updated as appropriate.   Past Medical History:  Diagnosis Date  . Anemia   . Cataract   . Gastric ulcer 11/17/2011  . HTN (hypertension)   . Hyperlipemia   . Hyperplastic polyp of intestine 11/17/2011  . Hypertension   . Measles   . Menopause   . Migraine   .  Osteoarthritis    Past Surgical History:  Procedure Laterality Date  . ABDOMINAL HYSTERECTOMY    . CARPAL TUNNEL RELEASE Right   . CATARACT EXTRACTION Bilateral   . HIP ARTHROPLASTY Left 2011  . JOINT REPLACEMENT    . RETINAL DETACHMENT SURGERY    . TOTAL KNEE ARTHROPLASTY Right 11/03/2015   Procedure: RIGHT TOTAL KNEE ARTHROPLASTY;  Surgeon: Gaynelle Arabian, MD;  Location: [facility];  Service: Orthopedics;  Laterality: Right;  . TOTAL VAGINAL HYSTERECTOMY  2012    Family History  Problem Relation Age of Onset  . Diabetes Mother     deceased  . Heart attack Mother   . Diabetes Sister     x2  . Kidney cancer Father   . Heart attack Father   . Diabetes Sister    Social History  Substance Use Topics  . Smoking status: Never Smoker  . Smokeless tobacco: Never Used  . Alcohol use No   Current Medications and Allergies:   .  aspirin EC 81 MG tablet, Take 81 mg by mouth daily., Disp: , Rfl:  .  fluticasone (FLONASE) 50 MCG/ACT nasal spray, Place 1 spray into both nostrils 2 (two) times daily as needed for allergies or rhinitis., Disp: 16 g, Rfl: 6 .  furosemide (LASIX) 20 MG tablet, Take 1 tablet (20 mg total) by mouth daily. (Patient taking differently: Take 20 mg by mouth as needed for fluid or edema. ), Disp: 30 tablet, Rfl: 0 .  methocarbamol (ROBAXIN) 500  MG tablet, Take 1 tablet (500 mg total) by mouth every 6 (six) hours as needed for muscle spasms., Disp: 80 tablet, Rfl: 0 .  pravastatin (PRAVACHOL) 40 MG tablet, Take 1 tablet (40 mg total) by mouth daily., Disp: 90 tablet, Rfl: 1 .  triamterene-hydrochlorothiazide (DYAZIDE) 37.5-25 MG capsule, TAKE 1 CAPSULE EACH BY MOUTH DAILY., Disp: 90 capsule, Rfl: 0   Allergies  Allergen Reactions  . Probiotic [Acidophilus]      Review of Systems:   Review of Systems  Constitutional: Positive for malaise/fatigue. Negative for chills and fever.  HENT: Positive for ear pain, sinus pain and tinnitus. Negative for congestion and sore  throat.   Eyes: Positive for blurred vision. Negative for double vision.  Respiratory: Positive for cough. Negative for shortness of breath and wheezing.   Cardiovascular: Negative for chest pain, palpitations and leg swelling.  Gastrointestinal: Positive for nausea. Negative for abdominal pain, constipation, diarrhea and vomiting.  Genitourinary: Positive for dysuria, frequency and urgency. Negative for flank pain and hematuria.  Musculoskeletal: Negative for back pain, joint pain and neck pain.  Neurological: Positive for dizziness and headaches.  Psychiatric/Behavioral: Negative for depression and hallucinations.    Vitals:   Vitals:   06/17/16 1102  BP: 114/68  Temp: 98.2 F (36.8 C)  TempSrc: Oral  Weight: 153 lb 12.8 oz (69.8 kg)  Height: 5' 0.5" (1.537 m)     Body mass index is 29.54 kg/m.   Physical Exam:   Physical Exam  Constitutional: She is oriented to person, place, and time. She appears well-developed and well-nourished. No distress.  HENT:  Head: Normocephalic and atraumatic.  Right Ear: Hearing, tympanic membrane and ear canal normal.  Left Ear: Hearing, tympanic membrane and ear canal normal.  Nose: Mucosal edema present.  Mouth/Throat: Oropharynx is clear and moist.  Eyes: Conjunctivae and EOM are normal. Pupils are equal, round, and reactive to light.  Neck: Normal range of motion. Neck supple. No thyromegaly present.  Cardiovascular: Normal rate, regular rhythm, normal heart sounds and intact distal pulses.   Pulmonary/Chest: Effort normal and breath sounds normal.  Abdominal: Soft. Bowel sounds are normal.  Musculoskeletal: Normal range of motion.  Lymphadenopathy:    She has no cervical adenopathy.  Neurological: She is alert and oriented to person, place, and time. She displays normal reflexes. No cranial nerve deficit or sensory deficit. She exhibits normal muscle tone. Coordination normal.  Skin: Skin is warm.  Psychiatric: She has a normal mood  and affect. Her behavior is normal. Judgment and thought content normal.  Nursing note and vitals reviewed.    Assessment and Plan:    Misty Cervantes was seen today for establish care.  Diagnoses and all orders for this visit:  Dysuria -     POCT Urinalysis Dipstick (Automated)  Acute cystitis without hematuria Comments: Will go ahead and treat while culturing urine.  Orders: -     Urine Culture -     nitrofurantoin, macrocrystal-monohydrate, (MACROBID) 100 MG capsule; Take 1 capsule (100 mg total) by mouth 2 (two) times daily.  Dizziness -     CBC with Differential/Platelet -     Comprehensive metabolic panel -     meclizine (ANTIVERT) 25 MG tablet; Take 1 tablet (25 mg total) by mouth 3 (three) times daily as needed for dizziness.  Vertigo Comments: Due to ETD.  Dysfunction of right eustachian tube Comments: Recommended OTC antihistamine and flonase.  Pre-diabetes -     Hemoglobin A1c    . Reviewed expectations  re: course of current medical issues. . Discussed self-management of symptoms. . Outlined signs and symptoms indicating need for more acute intervention. . Patient verbalized understanding and all questions were answered. . See orders for this visit as documented in the electronic medical record. . Patient received an After Visit Summary.  Records requested if needed. I spent 45 minutes with this patient, greater than 50% was face-to-face time counseling regarding the above diagnoses.  CMA served as Education administrator during this visit. History, Physical, and Plan performed by medical provider. Documentation and orders reviewed and attested to. Briscoe Deutscher, D.O.  Briscoe Deutscher, [place], [place] 06/19/2016   Follow-up: Return in about 6 months (around 12/17/2016).  Meds ordered this encounter  Medications  . aspirin EC 81 MG tablet    Sig: Take 81 mg by mouth daily.  . nitrofurantoin, macrocrystal-monohydrate, (MACROBID) 100 MG capsule    Sig: Take 1  capsule (100 mg total) by mouth 2 (two) times daily.    Dispense:  14 capsule    Refill:  0  . meclizine (ANTIVERT) 25 MG tablet    Sig: Take 1 tablet (25 mg total) by mouth 3 (three) times daily as needed for dizziness.    Dispense:  30 tablet    Refill:  0   Medications Discontinued During This Encounter  Medication Reason  . HYDROcodone-acetaminophen (NORCO/VICODIN) 5-325 MG tablet Error  . rivaroxaban (XARELTO) 10 MG TABS tablet Error  . traMADol (ULTRAM) 50 MG tablet Error   Orders Placed This Encounter  Procedures  . Urine Culture  . CBC with Differential/Platelet  . Comprehensive metabolic panel  . Hemoglobin A1c  . POCT Urinalysis Dipstick (Automated)

## 2016-06-17 NOTE — Progress Notes (Signed)
Pre visit review using our clinic review tool, if applicable. No additional management support is needed unless otherwise documented below in the visit note. 

## 2016-06-18 ENCOUNTER — Telehealth: Payer: Self-pay | Admitting: Surgical

## 2016-06-18 ENCOUNTER — Ambulatory Visit (INDEPENDENT_AMBULATORY_CARE_PROVIDER_SITE_OTHER): Payer: Medicare Other | Admitting: Family Medicine

## 2016-06-18 ENCOUNTER — Encounter: Payer: Self-pay | Admitting: Family Medicine

## 2016-06-18 VITALS — BP 150/90 | HR 101 | Ht 60.5 in | Wt 153.4 lb

## 2016-06-18 DIAGNOSIS — E1165 Type 2 diabetes mellitus with hyperglycemia: Secondary | ICD-10-CM | POA: Diagnosis not present

## 2016-06-18 DIAGNOSIS — D649 Anemia, unspecified: Secondary | ICD-10-CM

## 2016-06-18 DIAGNOSIS — I10 Essential (primary) hypertension: Secondary | ICD-10-CM | POA: Diagnosis not present

## 2016-06-18 LAB — URINE CULTURE: Organism ID, Bacteria: NO GROWTH

## 2016-06-18 MED ORDER — LIRAGLUTIDE 18 MG/3ML ~~LOC~~ SOPN: PEN_INJECTOR | SUBCUTANEOUS | 3 refills | Status: DC

## 2016-06-18 MED ORDER — METFORMIN HCL 1000 MG PO TABS: 1000.0000 mg | ORAL_TABLET | Freq: Two times a day (BID) | ORAL | 3 refills | Status: DC

## 2016-06-18 NOTE — Telephone Encounter (Signed)
Patient came in for appointment.  

## 2016-06-18 NOTE — Telephone Encounter (Signed)
Left message for patient to return call. Per Dr. Juleen China need to get schedule for today.

## 2016-06-18 NOTE — Progress Notes (Signed)
Misty Cervantes is a 71 y.o. female is here to discuss:  History of Present Illness:   Chief Complaint  Patient presents with  . Discuss Lab Results   HPI:  1. Type 2 diabetes mellitus with hyperglycemia, without long-term current use of insulin ([place]). New. On ACE inhibitor or angiotensin II receptor blocker?  No. On Aspirin? Yes. Current diet: in general, a "healthy" diet. Current exercise: none.   Lab Results  Component Value Date   HGBA1C 10.6 (H) 06/17/2016    2. Essential hypertension, benign. Home blood pressure readings at goal. Avoiding excessive salt intake. Denies chest pain. Taking medications as prescribed without side effects.   Wt Readings from Last 3 Encounters:  06/18/16 153 lb 6.4 oz (69.6 kg)  06/17/16 153 lb 12.8 oz (69.8 kg)  11/03/15 156 lb 1 oz (70.8 kg)    reports that she has never smoked. She has never used smokeless tobacco. BP Readings from Last 3 Encounters:  06/18/16 (!) 150/90  06/17/16 114/68  11/06/15 119/60   Lab Results  Component Value Date   CREATININE 1.16 06/17/2016    3. Anemia, unspecified type. Improved.  CBC Latest Ref Rng & Units 06/17/2016 11/06/2015 11/05/2015  WBC 4.0 - 10.5 K/uL 4.5 7.2 9.9  Hemoglobin 12.0 - 15.0 g/dL 14.4 11.5(L) 11.5(L)  Hematocrit 36.0 - 46.0 % 42.7 34.5(L) 33.3(L)  Platelets 150.0 - 400.0 K/uL 225.0 230 228     Health Maintenance Due  Topic Date Due  . Hepatitis C Screening  04-Feb-1946  . FOOT EXAM  12/31/1954  . OPHTHALMOLOGY EXAM  12/31/1954  . TETANUS/TDAP  12/31/1963  . PNA vac Low Risk Adult (2 of 2 - PCV13) 01/01/2012    PMHx, SurgHx, SocialHx, FamHx, Medications, and Allergies were reviewed in the Visit Navigator and updated as appropriate.   Patient Active Problem List   Diagnosis Date Noted  . Type 2 diabetes mellitus with hyperglycemia, without long-term current use of insulin ([place]) 06/18/2016  . History of right knee joint replacement 06/17/2016  . History of left hip replacement  06/17/2016  . History of vaginal hysterectomy 06/17/2016  . OA (osteoarthritis) of knee 11/03/2015  . Hyperlipidemia 07/23/2014  . Other and unspecified hyperlipidemia 08/11/2012  . Genu valgum 08/11/2012  . Overweight(278.02) 02/25/2010  . Essential hypertension, benign 02/25/2010    Social History  Substance Use Topics  . Smoking status: Never Smoker  . Smokeless tobacco: Never Used  . Alcohol use No    Current Medications and Allergies:   .  aspirin EC 81 MG tablet, Take 81 mg by mouth daily., Disp: , Rfl:  .  fluticasone (FLONASE) 50 MCG/ACT nasal spray, Place 1 spray into both nostrils 2 (two) times daily as needed for allergies or rhinitis., Disp: 16 g, Rfl: 6 .  furosemide (LASIX) 20 MG tablet, Take 1 tablet (20 mg total) by mouth daily. (Patient taking differently: Take 20 mg by mouth as needed for fluid or edema. ), Disp: 30 tablet, Rfl: 0 .  nitrofurantoin, macrocrystal-monohydrate, (MACROBID) 100 MG capsule, Take 1 capsule (100 mg total) by mouth 2 (two) times daily., Disp: 14 capsule, Rfl: 0 .  pravastatin (PRAVACHOL) 40 MG tablet, Take 1 tablet (40 mg total) by mouth daily., Disp: 90 tablet, Rfl: 1 .  triamterene-hydrochlorothiazide (DYAZIDE) 37.5-25 MG capsule, TAKE 1 CAPSULE EACH BY MOUTH DAILY., Disp: 90 capsule, Rfl: 0    Allergies  Allergen Reactions  . Probiotic [Acidophilus]    Review of Systems   Review of  Systems  Constitutional: Negative for chills and fever.  Eyes: Positive for blurred vision. Negative for double vision.  Respiratory: Negative for cough and wheezing.   Cardiovascular: Negative for chest pain and palpitations.  Gastrointestinal: Negative for abdominal pain, constipation, diarrhea, nausea and vomiting.  Genitourinary: Negative for dysuria.  Skin: Negative for rash.  Neurological: Positive for dizziness and headaches. Negative for sensory change, focal weakness and loss of consciousness.  Endo/Heme/Allergies: Does not bruise/bleed  easily.  Psychiatric/Behavioral: Negative for depression, memory loss and suicidal ideas. The patient is not nervous/anxious.     Vitals:   Vitals:   06/18/16 1107  BP: (!) 150/90  Pulse: (!) 101  SpO2: 98%  Weight: 153 lb 6.4 oz (69.6 kg)  Height: 5' 0.5" (1.537 m)     Body mass index is 29.47 kg/m.   Physical Exam:    Physical Exam  Constitutional: She appears well-nourished.  HENT:  Head: Normocephalic and atraumatic.  Eyes: EOM are normal. Pupils are equal, round, and reactive to light.  Neck: Normal range of motion. Neck supple.  Cardiovascular: Normal rate, regular rhythm, normal heart sounds and intact distal pulses.   Pulmonary/Chest: Effort normal.  Abdominal: Soft.  Skin: Skin is warm.  Psychiatric: She has a normal mood and affect. Her behavior is normal.  Nursing note and vitals reviewed.    Assessment and Plan:    Misty Cervantes was seen today for discuss lab results.  Diagnoses and all orders for this visit:  Type 2 diabetes mellitus with hyperglycemia, without long-term current use of insulin ([place]) Comments: New. Time spent reviewing diagnosis, implications, management. Orders below. Recheck in 2-3 weeks.  Orders: -     metFORMIN (GLUCOPHAGE) 1000 MG tablet; Take 1 tablet (1,000 mg total) by mouth 2 (two) times daily with a meal. -     liraglutide 18 MG/3ML SOPN; 0.6 mg [place] qd x 1 week, then 1.2 mg [place] qd. -     Ambulatory referral to Ophthalmology -     Referral to Nutrition and Diabetes Services  Essential hypertension, benign Comments: Will add ACE at next visit. Did not want to overwhelm patient.   Anemia, unspecified type   . Reviewed expectations re: course of current medical issues. . Discussed self-management of symptoms. . Outlined signs and symptoms indicating need for more acute intervention. . Patient verbalized understanding and all questions were answered. . See orders for this visit as documented in the electronic medical  record. . Patient received an After Visit Summary.   Briscoe Deutscher, [place], [facility] 06/18/2016

## 2016-06-18 NOTE — Progress Notes (Signed)
Pre visit review using our clinic review tool, if applicable. No additional management support is needed unless otherwise documented below in the visit note. 

## 2016-06-19 ENCOUNTER — Encounter: Payer: Self-pay | Admitting: Family Medicine

## 2016-06-21 ENCOUNTER — Telehealth: Payer: Self-pay | Admitting: Family Medicine

## 2016-06-21 NOTE — Telephone Encounter (Signed)
Patient called and has questions about the medication prescribed on Friday, metFORMIN (GLUCOPHAGE) 1000 MG tablet and another that was sent directly to the  [facility] [place], [place] [phone] (Phone) [phone] (Fax)  . Patient would like to discuss substitutions possibly for the other medication she is unsure about that she has not had filled yet. Please call patient at [phone] to advise. Okay to leave a detailed message.

## 2016-06-21 NOTE — Telephone Encounter (Signed)
Left message for patient to return call.

## 2016-06-21 NOTE — Telephone Encounter (Signed)
Patient called back and would like to take another tablet instead of doing an injection. Can you send in something else in?

## 2016-06-22 MED ORDER — GLUCOSE BLOOD VI STRP: ORAL_STRIP | 6 refills | Status: DC

## 2016-06-22 MED ORDER — BLOOD GLUCOSE METER KIT: PACK | 0 refills | Status: DC

## 2016-06-22 NOTE — Telephone Encounter (Signed)
I have scheduled patient to come back in for a follow up. She is still confused on the medications so would like to discuss further.

## 2016-06-22 NOTE — Addendum Note (Signed)
Addended by: Briscoe Deutscher R on: 06/22/2016 08:40 AM   Modules accepted: Orders

## 2016-06-22 NOTE — Telephone Encounter (Signed)
I'm going to go ahead and refer this patient to Endocrine. Let me know how long it will take to get her in as it may change my plan a little. I'll also send in meter and supplies so she can start checking her blood sugars. If she would like to come by to pick up a kit, that would be great - someone can show her how to start checking right away. I had planned to do that at the next visit, but will start sooner if she is unable to do the Victoza. Make sure that she gets a meter, coupon for strips, and a book to log blood sugars.

## 2016-06-23 ENCOUNTER — Encounter: Payer: Self-pay | Admitting: Family Medicine

## 2016-06-23 ENCOUNTER — Ambulatory Visit (INDEPENDENT_AMBULATORY_CARE_PROVIDER_SITE_OTHER): Payer: Medicare Other | Admitting: Family Medicine

## 2016-06-23 VITALS — BP 112/68 | HR 99 | Temp 98.2°F | Ht 60.5 in | Wt 154.8 lb

## 2016-06-23 DIAGNOSIS — E785 Hyperlipidemia, unspecified: Secondary | ICD-10-CM

## 2016-06-23 DIAGNOSIS — E1165 Type 2 diabetes mellitus with hyperglycemia: Secondary | ICD-10-CM

## 2016-06-23 MED ORDER — GLIPIZIDE 5 MG PO TABS: 5.0000 mg | ORAL_TABLET | Freq: Every day | ORAL | 3 refills | Status: DC

## 2016-06-23 NOTE — Progress Notes (Signed)
Pre visit review using our clinic review tool, if applicable. No additional management support is needed unless otherwise documented below in the visit note. 

## 2016-06-23 NOTE — Progress Notes (Signed)
Misty Cervantes is a 71 y.o. female is here to discuss:  History of Present Illness:   Misty Cervantes CMA acting as scribe for Dr. Juleen China.  Chief Complaint  Patient presents with  . Follow-up    Diabetes    HPI Patient is coming in today to discuss new diabetes medication. She wants to discuss taking the pills verses the injection.   At the last visit, Rx Metformin and Victoza were given. She picked up the Metformin - has not started it yet. She did not pick up the Victoza due to expense and desire to avoid injections. She presents today to ask if she can try oral medications only along with lifestyle changes. She will be seeing a Nutritionist and increasing exercise at the [facility]. Other screening and education referrals in place but have not happened yet.   Health Maintenance Due  Topic Date Due  . Hepatitis C Screening  [birth year]  . OPHTHALMOLOGY EXAM  12/31/1954  . URINE MICROALBUMIN  12/31/1954  . TETANUS/TDAP  12/31/1963  . PNA vac Low Risk Adult (2 of 2 - PCV13) 01/01/2012   PMHx, SurgHx, SocialHx, FamHx, Medications, and Allergies were reviewed in the Visit Navigator and updated as appropriate.   Patient Active Problem List   Diagnosis Date Noted  . Type 2 diabetes mellitus with hyperglycemia, without long-term current use of insulin ([place]) 06/18/2016  . History of right knee joint replacement 06/17/2016  . History of left hip replacement 06/17/2016  . History of vaginal hysterectomy 06/17/2016  . OA (osteoarthritis) of knee 11/03/2015  . Hyperlipidemia 07/23/2014  . Other and unspecified hyperlipidemia 08/11/2012  . Genu valgum 08/11/2012  . Overweight(278.02) 02/25/2010  . Essential hypertension, benign 02/25/2010   Social History  Substance Use Topics  . Smoking status: Never Smoker  . Smokeless tobacco: Never Used  . Alcohol use No   Current Medications and Allergies:   Current Outpatient Prescriptions:  .  aspirin EC 81 MG tablet, Take 81 mg by mouth  daily., Disp: , Rfl:  .  blood glucose meter kit and supplies. .  fluticasone (FLONASE) 50 MCG/ACT nasal spray, Place 1 spray into both nostrils 2 (two) times daily as needed for allergies or rhinitis., Disp: 16 g, Rfl: 6 .  furosemide (LASIX) 20 MG tablet, Take 1 tablet (20 mg total) by mouth daily.  .  pravastatin (PRAVACHOL) 40 MG tablet, Take 1 tablet (40 mg total) by mouth daily., Disp: 90 tablet, Rfl: 1 .  triamterene-hydrochlorothiazide (DYAZIDE) 37.5-25 MG capsule, TAKE 1 CAPSULE EACH BY MOUTH DAILY., Disp: 90 capsule, Rfl: 0  Allergies  Allergen Reactions  . Probiotic [Acidophilus]    Review of Systems   Review of Systems  Constitutional: Negative for chills, fever and malaise/fatigue.  HENT: Positive for sinus pain. Negative for congestion, ear pain and sore throat.   Eyes: Negative for double vision.  Respiratory: Negative for cough, shortness of breath and wheezing.   Cardiovascular: Negative for palpitations and leg swelling.  Gastrointestinal: Negative for abdominal pain, constipation, diarrhea and vomiting.  Genitourinary: Negative for dysuria.  Musculoskeletal: Negative for back pain, joint pain and neck pain.  Neurological:       Vertigo is getting better.  Psychiatric/Behavioral: Negative for depression, hallucinations and memory loss.    Vitals:   Vitals:   06/23/16 1031  BP: 112/68  Pulse: 99  Temp: 98.2 F (36.8 C)  TempSrc: Oral  SpO2: 96%  Weight: 154 lb 12.8 oz (70.2 kg)  Height: 5' 0.5" (1.537  m)     Body mass index is 29.73 kg/m.   Physical Exam:    Physical Exam  Constitutional: She appears well-nourished.  HENT:  Head: Normocephalic and atraumatic.  Eyes: EOM are normal. Pupils are equal, round, and reactive to light.  Neck: Normal range of motion. Neck supple.  Cardiovascular: Normal rate, regular rhythm, normal heart sounds and intact distal pulses.   Pulmonary/Chest: Effort normal.  Abdominal: Soft.  Skin: Skin is warm.    Psychiatric: She has a normal mood and affect. Her behavior is normal.  Nursing note and vitals reviewed.  Assessment and Plan:   Misty Cervantes was seen today for follow-up.  Diagnoses and all orders for this visit:  Type 2 diabetes mellitus with hyperglycemia, without long-term current use of insulin ([place]) Comments: The patient would like to avoid injectable medications. She declines Endocrine referral just yet as well. Will see how she does x 3 months.  *PATIENT COMING IN FOR AMW ON Monday - I ASKED HER TO BE FASTING FOR LIPID PANEL AND GIVE UA FOR MICROALBUMIN.  Orders: -     glipiZIDE (GLUCOTROL) 5 MG tablet; Take 1 tablet (5 mg total) by mouth daily before breakfast.   . Reviewed expectations re: course of current medical issues. . Discussed self-management of symptoms. . Outlined signs and symptoms indicating need for more acute intervention. . Patient verbalized understanding and all questions were answered. . See orders for this visit as documented in the electronic medical record. . Patient received an After Visit Summary.   CMA served as Education administrator during this visit. History, Physical, and Plan performed by medical provider. Documentation and orders reviewed and attested to. Briscoe Deutscher, D.O.  Briscoe Deutscher, [place], [facility] [place] 06/27/2016  Follow-up: No Follow-up on file.

## 2016-06-25 NOTE — Progress Notes (Addendum)
Subjective:   Misty Cervantes is a 71 y.o. female who presents for an Initial Medicare Annual Wellness Visit.  Review of Systems    No ROS.  Medicare Wellness Visit.  Cardiac Risk Factors include: advanced age (>76mn, >>22women);diabetes mellitus;dyslipidemia;hypertension   Sleep patterns: Normally sleeps 5 hrs/night. States she has been trying to go to bed earlier. Also takes an afternoon nap some days. 6-7 hrs is ideal per pt. States she feels rested. Home Safety/Smoke Alarms:  Feels safe in home. Smoke alarms in place.  Living environment; residence and Firearm Safety: Lives with son and two grandchildren.  Seat Belt Safety/Bike Helmet: Wears seat belt.   Counseling:   Eye Exam- Will call [facility] schedule appt.  Dental- Upper and lower dentures.  Female:   Pap- Aged out. [facility]   Mammo-    04/12/16  Probably benign right breast asymmetry. Bilateral diagnostic mammogram and possible ultrasound in 12 months. Dexa scan-     07/14/14. Will place order. [facility]- 11/17/11. Normal. 10 year recall.      Objective:    Today's Vitals   06/28/16 1115  BP: 118/66  Pulse: 82  Resp: 14  SpO2: 96%  Weight: 154 lb 6.4 oz (70 kg)  Height: 5' 1"  (1.549 m)   Body mass index is 29.17 kg/m.   Current Medications (verified) Outpatient Encounter Prescriptions as of 06/28/2016  Medication Sig  . aspirin EC 81 MG tablet Take 81 mg by mouth daily.  . blood glucose meter kit and supplies Dispense based on patient and insurance preference. Use up to four times daily as directed. (FOR ICD-9 250.00, 250.01).  . fluticasone (FLONASE) 50 MCG/ACT nasal spray Place 1 spray into both nostrils 2 (two) times daily as needed for allergies or rhinitis.  . furosemide (LASIX) 20 MG tablet Take 1 tablet (20 mg total) by mouth daily. (Patient taking differently: Take 20 mg by mouth as needed for fluid or edema. )  . glipiZIDE (GLUCOTROL) 5 MG tablet Take  1 tablet (5 mg total) by mouth daily before breakfast.  . glucose blood test strip To check bg qid  . meclizine (ANTIVERT) 25 MG tablet Take 1 tablet (25 mg total) by mouth 3 (three) times daily as needed for dizziness.  . metFORMIN (GLUCOPHAGE) 1000 MG tablet Take 1 tablet (1,000 mg total) by mouth 2 (two) times daily with a meal.  . methocarbamol (ROBAXIN) 500 MG tablet Take 1 tablet (500 mg total) by mouth every 6 (six) hours as needed for muscle spasms.  . pravastatin (PRAVACHOL) 40 MG tablet Take 1 tablet (40 mg total) by mouth daily.  .Marland Kitchentriamterene-hydrochlorothiazide (DYAZIDE) 37.5-25 MG capsule TAKE 1 CAPSULE EACH BY MOUTH DAILY.  . [DISCONTINUED] nitrofurantoin, macrocrystal-monohydrate, (MACROBID) 100 MG capsule Take 1 capsule (100 mg total) by mouth 2 (two) times daily.   No facility-administered encounter medications on file as of 06/28/2016.     Allergies (verified) Probiotic [acidophilus]   History: Past Medical History:  Diagnosis Date  . Anemia   . Cataract   . Gastric ulcer 11/17/2011  . HTN (hypertension)   . Hyperlipemia   . Hyperplastic polyp of intestine 11/17/2011  . Hypertension   . Measles   . Menopause   . Migraine   . Osteoarthritis    Past Surgical History:  Procedure Laterality Date  . ABDOMINAL HYSTERECTOMY    . CARPAL TUNNEL RELEASE Right   . CATARACT EXTRACTION Bilateral   .  HIP ARTHROPLASTY Left 2011  . JOINT REPLACEMENT    . RETINAL DETACHMENT SURGERY    . TOTAL KNEE ARTHROPLASTY Right 11/03/2015   Procedure: RIGHT TOTAL KNEE ARTHROPLASTY;  Surgeon: Gaynelle Arabian, MD;  Location: [facility];  Service: Orthopedics;  Laterality: Right;  . TOTAL VAGINAL HYSTERECTOMY  2012    Family History  Problem Relation Age of Onset  . Diabetes Mother     deceased  . Heart attack Mother   . Diabetes Sister     x2  . Kidney cancer Father   . Heart attack Father   . Diabetes Sister    Social History   Occupational History  . retired Retired   Social  History Main Topics  . Smoking status: Never Smoker  . Smokeless tobacco: Never Used  . Alcohol use No  . Drug use: No  . Sexual activity: No    Tobacco Counseling Counseling given: Not Answered   Activities of Daily Living In your present state of health, do you have any difficulty performing the following activities: 06/28/2016 11/03/2015  Hearing? N -  Vision? N -  Difficulty concentrating or making decisions? N -  Walking or climbing stairs? N -  Dressing or bathing? N -  Doing errands, shopping? N N  Preparing Food and eating ? N -  Using the Toilet? N -  In the past six months, have you accidently leaked urine? N -  Do you have problems with loss of bowel control? N -  Managing your Medications? N -  Managing your Finances? N -  Housekeeping or managing your Housekeeping? N -  Some recent data might be hidden    Immunizations and Health Maintenance Immunization History  Administered Date(s) Administered  . Influenza,inj,Quad PF,36+ Mos 01/28/2015  . Pneumococcal Polysaccharide-23 01/01/2011   Health Maintenance Due  Topic Date Due  . Hepatitis C Screening  January 02, 1946  . OPHTHALMOLOGY EXAM  12/31/1954  . URINE MICROALBUMIN  12/31/1954  . TETANUS/TDAP  12/31/1963  . PNA vac Low Risk Adult (2 of 2 - PCV13) 01/01/2012    Patient Care Team: Briscoe Deutscher, DO as PCP - General (Family Medicine) Gaynelle Arabian, MD as Consulting Physician (Orthopedic Surgery)  Indicate any recent Medical Services you may have received from other than [facility] providers in the past year (date may be approximate).     Assessment:   This is a routine wellness examination for [name]. Physical assessment deferred to PCP.  Hearing/Vision screen Hearing Screening Comments: Able to hear conversational tones w/o difficulty. No issues reported.  Had hearing screen at [employer]. No hearing aids needed, 1 year ago. Vision Screening Comments: Last check 8 years ago. Pt will schedule visit. Wears  bifocals when reading.  Dietary issues and exercise activities discussed: Current Exercise Habits: Structured exercise class, Type of exercise: walking;strength training/weights, Time (Minutes): 60, Frequency (Times/Week): 3, Weekly Exercise (Minutes/Week): 180, Intensity: Mild  Diet (meal preparation, eat out, water intake, caffeinated beverages, dairy products, fruits and vegetables): 3 cups water/day Breakfast: 1 cup bran flakes, milk, 1/2 banana Lunch: Salad with vegetables and lite dressing Dinner: Meat, vegetables and small portion of carbs. Buys healthy choice frozen meals.      Snacks on small apples, 1/2 orange, strawberries.   Goals    . Increase exercise to 3 days per week for one hour      Depression Screen PHQ 2/9 Scores 06/28/2016 09/29/2015 09/24/2015 08/25/2015 07/23/2014  PHQ - 2 Score 0 0 0 0 1  Fall Risk Fall Risk  06/28/2016 09/24/2015 08/25/2015 07/23/2014 10/29/2013  Falls in the past year? No No No No No    Cognitive Function:   Ad8 score reviewed for issues:  Issues making decisions:no  Less interest in hobbies / activities:no  Repeats questions, stories (family complaining):no  Trouble using ordinary gadgets (microwave, computer, phone):no  Forgets the month or year: no  Mismanaging finances: no  Remembering appts:no  Daily problems with thinking and/or memory:no Ad8 score is=0       Screening Tests Health Maintenance  Topic Date Due  . Hepatitis C Screening  Aug 18, 1945  . OPHTHALMOLOGY EXAM  12/31/1954  . URINE MICROALBUMIN  12/31/1954  . TETANUS/TDAP  12/31/1963  . PNA vac Low Risk Adult (2 of 2 - PCV13) 01/01/2012  . INFLUENZA VACCINE  10/13/2016  . HEMOGLOBIN A1C  12/17/2016  . FOOT EXAM  06/18/2017  . MAMMOGRAM  04/12/2018  . COLONOSCOPY  11/16/2021  . DEXA SCAN  Completed      Plan:    Bring a copy of your advance directives to your next office visit. Schedule Bone Density. Schedule eye exam.  Return as scheduled for lab  draws. Complete Diabetic Emmi programs sent to your email.  During the course of the visit, Jayme was educated and counseled about the following appropriate screening and preventive services:   Vaccines to include Pneumoccal, Influenza, Hepatitis B, Td, Zostavax, HCV  Cardiovascular disease screening  Colorectal cancer screening  Bone density screening  Diabetes screening  Glaucoma screening  Mammography/PAP  Nutrition counseling  Smoking cessation counseling  Patient Instructions (the written plan) were given to the patient.    Ree Edman, RN   06/28/2016   I have personally reviewed the Medicare Annual Wellness questionnaire and have noted 1. The patient's medical and social history 2. Their use of alcohol, tobacco or illicit drugs 3. Their current medications and supplements 4. The patient's functional ability including ADL's, fall risks, home safety risks and hearing or visual impairment. 5. Diet and physical activities 6. Evidence for depression or mood disorders 7. Reviewed Updated provider list, see scanned forms and [facility] Snapshot.   The patients weight, height, BMI and visual acuity have been recorded in the chart I have made referrals, counseling and provided education to the patient based review of the above and I have provided the pt with a written personalized care plan for preventive services.  I have provided the patient with a copy of your personalized plan for preventive services. Instructed to take the time to review along with their updated medication list.  Briscoe Deutscher, D.O. [place], [facility]

## 2016-06-25 NOTE — Progress Notes (Signed)
Pre visit review using our clinic review tool, if applicable. No additional management support is needed unless otherwise documented below in the visit note. 

## 2016-06-28 ENCOUNTER — Ambulatory Visit (INDEPENDENT_AMBULATORY_CARE_PROVIDER_SITE_OTHER): Payer: Medicare Other | Admitting: *Deleted

## 2016-06-28 ENCOUNTER — Encounter: Payer: Self-pay | Admitting: *Deleted

## 2016-06-28 VITALS — BP 118/66 | HR 82 | Resp 14 | Ht 61.0 in | Wt 154.4 lb

## 2016-06-28 DIAGNOSIS — Z0000 Encounter for general adult medical examination without abnormal findings: Secondary | ICD-10-CM | POA: Diagnosis not present

## 2016-06-28 DIAGNOSIS — Z780 Asymptomatic menopausal state: Secondary | ICD-10-CM

## 2016-06-28 NOTE — Patient Instructions (Addendum)
Schedule eye exam with [facility]  [phone].  Bring a copy of your advance directives to your next office visit. Watch your email  For [facility] computer training.  Schedule with Gynecology, referral will be placed today.  Schedule bone density, referral will be placed today.   Hyperglycemia Hyperglycemia is when the sugar (glucose) level in your blood is too high. It may not cause symptoms. If you do have symptoms, they may include warning signs, such as:  Feeling more thirsty than normal.  Hunger.  Feeling tired.  Needing to pee (urinate) more than normal.  Blurry eyesight (vision). You may get other symptoms as it gets worse, such as:  Dry mouth.  Not being hungry (loss of appetite).  Fruity-smelling breath.  Weakness.  Weight gain or loss that is not planned. Weight loss may be fast.  A tingling or numb feeling in your hands or feet.  Headache.  Skin that does not bounce back quickly when it is lightly pinched and released (poor skin turgor).  Pain in your belly (abdomen).  Cuts or bruises that heal slowly. High blood sugar can happen to people who do or do not have diabetes. High blood sugar can happen slowly or quickly, and it can be an emergency. Follow these instructions at home: General instructions   Take over-the-counter and prescription medicines only as told by your doctor.  Do not use products that contain nicotine or tobacco, such as cigarettes and e-cigarettes. If you need help quitting, ask your doctor.  Limit alcohol intake to no more than 1 drink per day for nonpregnant women and 2 drinks per day for men. One drink equals 12 oz of beer, 5 oz of wine, or 1 oz of hard liquor.  Manage stress. If you need help with this, ask your doctor.  Keep all follow-up visits as told by your doctor. This is important. Eating and drinking   Stay at a healthy weight.  Exercise regularly, as told by your doctor.  Drink enough fluid, especially when  you:  Exercise.  Get sick.  Are in hot temperatures.  Eat healthy foods, such as:  Low-fat (lean) proteins.  Complex carbs (complex carbohydrates), such as whole wheat bread or brown rice.  Fresh fruits and vegetables.  Low-fat dairy products.  Healthy fats.  Drink enough fluid to keep your pee (urine) clear or pale yellow. If you have diabetes:   Make sure you know the symptoms of hyperglycemia.  Follow your diabetes management plan, as told by your doctor. Make sure you:  Take insulin and medicines as told.  Follow your exercise plan.  Follow your meal plan. Eat on time. Do not skip meals.  Check your blood sugar as often as told. Make sure to check before and after exercise. If you exercise longer or in a different way than you normally do, check your blood sugar more often.  Follow your sick day plan whenever you cannot eat or drink normally. Make this plan ahead of time with your doctor.  Share your diabetes management plan with people in your workplace, school, and household.  Check your urine for ketones when you are ill and as told by your doctor.  Carry a card or wear jewelry that says that you have diabetes. Contact a doctor if:  Your blood sugar level is higher than 240 mg/dL (13.3 mmol/L) for 2 days in a row.  You have problems keeping your blood sugar in your target range.  High blood sugar happens often for  you. Get help right away if:  You have trouble breathing.  You have a change in how you think, feel, or act (mental status).  You feel sick to your stomach (nauseous), and that feeling does not go away.  You cannot stop throwing up (vomiting). These symptoms may be an emergency. Do not wait to see if the symptoms will go away. Get medical help right away. Call your local emergency services (911 in the [place] Do not drive yourself to the hospital. Summary  Hyperglycemia is when the sugar (glucose) level in your blood is too high.  High  blood sugar can happen to people who do or do not have diabetes.  Make sure you drink enough fluids, eat healthy foods, and exercise regularly.  Contact your doctor if you have problems keeping your blood sugar in your target range. This information is not intended to replace advice given to you by your health care provider. Make sure you discuss any questions you have with your health care provider. Document Released: 12/27/2008 Document Revised: 11/17/2015 Document Reviewed: 11/17/2015 Elsevier Interactive Patient Education  2017 [place].  Diabetes Mellitus and Food It is important for you to manage your blood sugar (glucose) level. Your blood glucose level can be greatly affected by what you eat. Eating healthier foods in the appropriate amounts throughout the day at about the same time each day will help you control your blood glucose level. It can also help slow or prevent worsening of your diabetes mellitus. Healthy eating may even help you improve the level of your blood pressure and reach or maintain a healthy weight. General recommendations for healthful eating and cooking habits include:  Eating meals and snacks regularly. Avoid going long periods of time without eating to lose weight.  Eating a diet that consists mainly of plant-based foods, such as fruits, vegetables, nuts, legumes, and whole grains.  Using low-heat cooking methods, such as baking, instead of high-heat cooking methods, such as deep frying. Work with your dietitian to make sure you understand how to use the Nutrition Facts information on food labels. How can food affect me? Carbohydrates  Carbohydrates affect your blood glucose level more than any other type of food. Your dietitian will help you determine how many carbohydrates to eat at each meal and teach you how to count carbohydrates. Counting carbohydrates is important to keep your blood glucose at a healthy level, especially if you are using insulin or  taking certain medicines for diabetes mellitus. Alcohol  Alcohol can cause sudden decreases in blood glucose (hypoglycemia), especially if you use insulin or take certain medicines for diabetes mellitus. Hypoglycemia can be a life-threatening condition. Symptoms of hypoglycemia (sleepiness, dizziness, and disorientation) are similar to symptoms of having too much alcohol. If your health care provider has given you approval to drink alcohol, do so in moderation and use the following guidelines:  Women should not have more than one drink per day, and men should not have more than two drinks per day. One drink is equal to:  12 oz of beer.  5 oz of wine.  1 oz of hard liquor.  Do not drink on an empty stomach.  Keep yourself hydrated. Have water, diet soda, or unsweetened iced tea.  Regular soda, juice, and other mixers might contain a lot of carbohydrates and should be counted. What foods are not recommended? As you make food choices, it is important to remember that all foods are not the same. Some foods have fewer nutrients per  serving than other foods, even though they might have the same number of calories or carbohydrates. It is difficult to get your body what it needs when you eat foods with fewer nutrients. Examples of foods that you should avoid that are high in calories and carbohydrates but low in nutrients include:  Trans fats (most processed foods list trans fats on the Nutrition Facts label).  Regular soda.  Juice.  Candy.  Sweets, such as cake, pie, doughnuts, and cookies.  Fried foods. What foods can I eat? Eat nutrient-rich foods, which will nourish your body and keep you healthy. The food you should eat also will depend on several factors, including:  The calories you need.  The medicines you take.  Your weight.  Your blood glucose level.  Your blood pressure level.  Your cholesterol level. You should eat a variety of foods, including:  Protein.  Lean  cuts of meat.  Proteins low in saturated fats, such as fish, egg whites, and beans. Avoid processed meats.  Fruits and vegetables.  Fruits and vegetables that may help control blood glucose levels, such as apples, mangoes, and yams.  Dairy products.  Choose fat-free or low-fat dairy products, such as milk, yogurt, and cheese.  Grains, bread, pasta, and rice.  Choose whole grain products, such as multigrain bread, whole oats, and brown rice. These foods may help control blood pressure.  Fats.  Foods containing healthful fats, such as nuts, avocado, olive oil, canola oil, and fish. Does everyone with diabetes mellitus have the same meal plan? Because every person with diabetes mellitus is different, there is not one meal plan that works for everyone. It is very important that you meet with a dietitian who will help you create a meal plan that is just right for you. This information is not intended to replace advice given to you by your health care provider. Make sure you discuss any questions you have with your health care provider. Document Released: 11/26/2004 Document Revised: 08/07/2015 Document Reviewed: 01/26/2013 Elsevier Interactive Patient Education  2017 [employer].

## 2016-06-30 ENCOUNTER — Other Ambulatory Visit (INDEPENDENT_AMBULATORY_CARE_PROVIDER_SITE_OTHER): Payer: Medicare Other

## 2016-06-30 DIAGNOSIS — E1165 Type 2 diabetes mellitus with hyperglycemia: Secondary | ICD-10-CM | POA: Diagnosis not present

## 2016-06-30 DIAGNOSIS — E785 Hyperlipidemia, unspecified: Secondary | ICD-10-CM | POA: Diagnosis not present

## 2016-06-30 LAB — MICROALBUMIN / CREATININE URINE RATIO
Creatinine,U: 99.3 mg/dL
Microalb Creat Ratio: 0.9 mg/g (ref 0.0–30.0)
Microalb, Ur: 0.9 mg/dL (ref 0.0–1.9)

## 2016-06-30 LAB — LIPID PANEL
Cholesterol: 180 mg/dL (ref 0–200)
HDL: 41.7 mg/dL (ref >39.00)
LDL Cholesterol: 107 mg/dL — ABNORMAL HIGH (ref 0–99)
NonHDL: 138.75
Total CHOL/HDL Ratio: 4
Triglycerides: 157 mg/dL — ABNORMAL HIGH (ref 0.0–149.0)
VLDL: 31.4 mg/dL (ref 0.0–40.0)

## 2016-07-20 ENCOUNTER — Other Ambulatory Visit: Payer: Self-pay | Admitting: Family Medicine

## 2016-07-20 DIAGNOSIS — R42 Dizziness and giddiness: Secondary | ICD-10-CM

## 2016-07-20 NOTE — Telephone Encounter (Signed)
Please advise on refill.

## 2016-07-22 ENCOUNTER — Encounter: Payer: Self-pay | Admitting: Registered"

## 2016-07-22 ENCOUNTER — Encounter: Payer: Medicare Other | Attending: Family Medicine | Admitting: Registered"

## 2016-07-22 DIAGNOSIS — E119 Type 2 diabetes mellitus without complications: Secondary | ICD-10-CM | POA: Insufficient documentation

## 2016-07-22 DIAGNOSIS — Z713 Dietary counseling and surveillance: Secondary | ICD-10-CM | POA: Diagnosis present

## 2016-07-22 NOTE — Progress Notes (Signed)
Diabetes Self-Management Education  Visit Type: First/Initial  Appt. Start Time: 1030 Appt. End Time: 1200  07/22/2016  [name], identified by name and date of birth, is a 71 y.o. female with a diagnosis of Diabetes: Type 2.   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 Patient reports when she was preparing to have knee replacement surgery, nurse noticed she had high BG and suggested she follow up with her doctor after surgery. Patient states she has been very busy, but once she got into her doctor, she has made changes and followed doctors instructions as best she could.  Per chart A1c: 6.2 -1 yr ago; 10.6 -1 month ago. RD suspects next A1c will be much lower based on her SMBG data. Patient reports extensive label reading and appears to be making some appropriate food choices to manage blood sugar.       Diabetes Self-Management Education - 07/22/16 1033      Visit Information   Visit Type First/Initial     Initial Visit   Diabetes Type Type 2   Are you currently following a meal plan? Yes   What type of meal plan do you follow? watching carbs   Are you taking your medications as prescribed? Yes   Date Diagnosed April 2018     Health Coping   How would you rate your overall health? Good     Psychosocial Assessment   Patient Belief/Attitude about Diabetes Other (comment)  trying to do what is advised   What is the last grade level you completed in school? high school & 1 yr college     Complications   How often do you check your blood sugar? 3-4 times/day   Fasting Blood glucose range (mg/dL) 70-129  today was 140 but was stressed   Postprandial Blood glucose range (mg/dL) --  87-133   Number of hypoglycemic episodes per month 1  once before lunch was 64   Can you tell when your blood sugar is low? No   Have you had a dilated eye exam in the past 12 months? No   Have you had a dental exam in the  past 12 months? No   Are you checking your feet? No     Dietary Intake   Breakfast 1 c bran cereal, 1 c ff milk, 1/2 banana   Snack (morning) none OR fruit   Lunch healthy choice dinner, pours most juice and didn't eat potatoes and a slice of ww bread   Snack (afternoon) apple   Dinner tomato sandwich (no fat mayo)   Snack (evening) none   Beverage(s) water (16 oz), diet ginger ale     Exercise   Exercise Type Light (walking / raking leaves)  bike   How many days per week to you exercise? 3   How many minutes per day do you exercise? 45   Total minutes per week of exercise 135     Patient Education   Previous Diabetes Education No   Disease state  Definition of diabetes, type 1 and 2, and the diagnosis of diabetes;Factors that contribute to the development of diabetes   Nutrition management  Role of diet in the treatment of diabetes and the relationship between the three main macronutrients and blood glucose level;Food label reading, portion sizes and measuring food.;Carbohydrate counting;Reviewed blood glucose goals for pre and post meals and how to evaluate the patients' food intake on their  blood glucose level.   Physical activity and exercise  Role of exercise on diabetes management, blood pressure control and cardiac health.   Monitoring Identified appropriate SMBG and/or A1C goals.   Acute complications Taught treatment of hypoglycemia - the 15 rule.   Chronic complications Lipid levels, blood glucose control and heart disease;Relationship between chronic complications and blood glucose control     Individualized Goals (developed by patient)   Nutrition Follow meal plan discussed   Physical Activity Exercise 5-7 days per week     Outcomes   Expected Outcomes Demonstrated interest in learning. Expect positive outcomes   Future DMSE PRN   Program Status Completed      Individualized Plan for Diabetes Self-Management Training:   Learning Objective:  Patient will have a  greater understanding of diabetes self-management. Patient education plan is to attend individual and/or group sessions per assessed needs and concerns.   Patient Instructions  For Constipation: Be sure to drink lots of water especially around breakfast time because you are getting a good dose of fiber with your breakfast Magnesium supplement (Calm is a powder form) may help with sleep and constipation.  Plan:  Aim for 2-3 Carb Choices per meal (30-45 grams)   Aim for 0-1 Carbs per snack if hungry, if having carb pair it with a protein Include protein with your meals and snacks Continue reading food labels for Total Carbohydrate and Sat Fat Grams of foods When able consider increasing your activity level daily as tolerated Continue checking BG at alternate times per day as directed by MD  Consider taking medication as directed by MD    Expected Outcomes:  Demonstrated interest in learning. Expect positive outcomes  Education material provided: Living Well with Diabetes, Food label handouts, A1C conversion sheet, My Plate and Carbohydrate counting sheet  If problems or questions, patient to contact team via:  Phone and MyChart  Future DSME appointment: PRN

## 2016-07-22 NOTE — Patient Instructions (Signed)
For Constipation: Be sure to drink lots of water especially around breakfast time because you are getting a good dose of fiber with your breakfast Magnesium supplement (Calm is a powder form) may help with sleep and constipation.  Plan:  Aim for 2-3 Carb Choices per meal (30-45 grams)   Aim for 0-1 Carbs per snack if hungry, if having carb pair it with a protein Include protein with your meals and snacks Continue reading food labels for Total Carbohydrate and Sat Fat Grams of foods When able consider increasing your activity level daily as tolerated Continue checking BG at alternate times per day as directed by MD  Consider taking medication as directed by MD

## 2016-07-23 ENCOUNTER — Ambulatory Visit (INDEPENDENT_AMBULATORY_CARE_PROVIDER_SITE_OTHER): Payer: Medicare Other | Admitting: Family Medicine

## 2016-07-23 ENCOUNTER — Encounter: Payer: Self-pay | Admitting: Family Medicine

## 2016-07-23 VITALS — BP 122/76 | HR 86 | Temp 97.4°F | Ht 60.5 in | Wt 150.8 lb

## 2016-07-23 DIAGNOSIS — E1165 Type 2 diabetes mellitus with hyperglycemia: Secondary | ICD-10-CM | POA: Diagnosis not present

## 2016-07-23 DIAGNOSIS — I10 Essential (primary) hypertension: Secondary | ICD-10-CM

## 2016-07-23 MED ORDER — TRIAMTERENE-HCTZ 37.5-25 MG PO CAPS: ORAL_CAPSULE | ORAL | 3 refills | Status: DC

## 2016-07-23 NOTE — Progress Notes (Signed)
Misty Cervantes is a 71 y.o. female is here for follow up.  History of Present Illness:   Misty Cervantes CMA acting as scribe for Dr. Juleen China.  HPI Patient is coming in today to for a follow up for her diabetes. She has been tolerating medications very well. She has been taking her blood sugar readings and they are staying below 150 the last month. Lowest BS was 64 - no symptoms. She has since adjusted to keep them above 70. She met with a nutritionist, has learned to carb count. She is exercising at the [facility] 3-4 days per week.        Health Maintenance Due  Topic Date Due  . Hepatitis C Screening  [birth year]  . OPHTHALMOLOGY EXAM  12/31/1954  . TETANUS/TDAP  12/31/1963  . PNA vac Low Risk Adult (2 of 2 - PCV13) 01/01/2012    PMHx, SurgHx, SocialHx, FamHx, Medications, and Allergies were reviewed in the Visit Navigator and updated as appropriate.   Patient Active Problem List   Diagnosis Date Noted  . Type 2 diabetes mellitus with hyperglycemia, without long-term current use of insulin ([place]) 06/18/2016  . History of right knee joint replacement 06/17/2016  . History of left hip replacement 06/17/2016  . History of vaginal hysterectomy 06/17/2016  . OA (osteoarthritis) of knee 11/03/2015  . Hyperlipidemia 07/23/2014  . Other and unspecified hyperlipidemia 08/11/2012  . Genu valgum 08/11/2012  . Overweight(278.02) 02/25/2010  . Essential hypertension, benign 02/25/2010   Social History  Substance Use Topics  . Smoking status: Never Smoker  . Smokeless tobacco: Never Used  . Alcohol use No   Current Medications and Allergies:   Current Outpatient Prescriptions:  .  aspirin EC 81 MG tablet, Take 81 mg by mouth daily., Disp: , Rfl:  .  blood glucose meter kit and supplies, Dispense based on patient and insurance preference. Use up to four times daily as directed. (FOR ICD-9 250.00, 250.01)., Disp: 1 each, Rfl: 0 .  fluticasone (FLONASE) 50 MCG/ACT nasal spray, Place 1 spray  into both nostrils 2 (two) times daily as needed for allergies or rhinitis., Disp: 16 g, Rfl: 6 .  furosemide (LASIX) 20 MG tablet, Take 1 tablet (20 mg total) by mouth daily. (Patient taking differently: Take 20 mg by mouth as needed for fluid or edema. ), Disp: 30 tablet, Rfl: 0 .  glipiZIDE (GLUCOTROL) 5 MG tablet, Take 1 tablet (5 mg total) by mouth daily before breakfast., Disp: 90 tablet, Rfl: 3 .  glucose blood test strip, To check bg qid, Disp: 100 each, Rfl: 6 .  meclizine (ANTIVERT) 25 MG tablet, TAKE 1 TABLET (25 MG TOTAL) BY MOUTH 3 (THREE) TIMES DAILY AS NEEDED FOR DIZZINESS., Disp: 30 tablet, Rfl: 0 .  metFORMIN (GLUCOPHAGE) 1000 MG tablet, Take 1 tablet (1,000 mg total) by mouth 2 (two) times daily with a meal., Disp: 180 tablet, Rfl: 3 .  methocarbamol (ROBAXIN) 500 MG tablet, Take 1 tablet (500 mg total) by mouth every 6 (six) hours as needed for muscle spasms., Disp: 80 tablet, Rfl: 0 .  pravastatin (PRAVACHOL) 40 MG tablet, Take 1 tablet (40 mg total) by mouth daily., Disp: 90 tablet, Rfl: 1 .  triamterene-hydrochlorothiazide (DYAZIDE) 37.5-25 MG capsule, TAKE 1 CAPSULE EACH BY MOUTH DAILY., Disp: 90 capsule, Rfl: 3  Allergies  Allergen Reactions  . Probiotic [Acidophilus]    Review of Systems   Review of Systems  Constitutional: Negative for chills, fever and malaise/fatigue.  HENT: Positive for  sinus pain. Negative for congestion, ear pain and sore throat.   Eyes: Negative for blurred vision and double vision.  Respiratory: Negative for cough, shortness of breath and wheezing.   Cardiovascular: Negative for chest pain, palpitations and leg swelling.  Gastrointestinal: Negative for abdominal pain, nausea and vomiting.  Genitourinary: Negative for dysuria.  Musculoskeletal: Negative for back pain, joint pain and neck pain.  Neurological: Negative for dizziness and headaches.  Psychiatric/Behavioral: Negative for depression, hallucinations and memory loss.    Vitals:    Vitals:   07/23/16 0956  BP: 122/76  Pulse: 86  Temp: 97.4 F (36.3 C)  TempSrc: Oral  SpO2: 96%  Weight: 150 lb 12.8 oz (68.4 kg)  Height: 5' 0.5" (1.537 m)     Body mass index is 28.97 kg/m.   Physical Exam:   Physical Exam  Constitutional: She appears well-nourished.  HENT:  Head: Normocephalic and atraumatic.  Eyes: EOM are normal. Pupils are equal, round, and reactive to light.  Neck: Normal range of motion. Neck supple.  Cardiovascular: Normal rate, regular rhythm, normal heart sounds and intact distal pulses.   Pulmonary/Chest: Effort normal.  Abdominal: Soft.  Skin: Skin is warm.  Psychiatric: She has a normal mood and affect. Her behavior is normal.  Nursing note and vitals reviewed.    Results for orders placed or performed in visit on 06/30/16  Lipid panel  Result Value Ref Range   Cholesterol 180 0 - 200 mg/dL   Triglycerides 157.0 (H) 0.0 - 149.0 mg/dL   HDL 41.70 >39.00 mg/dL   VLDL 31.4 0.0 - 40.0 mg/dL   LDL Cholesterol 107 (H) 0 - 99 mg/dL   Total CHOL/HDL Ratio 4    NonHDL 138.75   Microalbumin / creatinine urine ratio  Result Value Ref Range   Microalb, Ur 0.9 0.0 - 1.9 mg/dL   Creatinine,U 99.3 mg/dL   Microalb Creat Ratio 0.9 0.0 - 30.0 mg/g    Assessment and Plan:    Misty Cervantes was seen today for follow-up.  Diagnoses and all orders for this visit:  Type 2 diabetes mellitus with hyperglycemia, without long-term current use of insulin ([place]) Comments: She is doing a wonderful job. I congratulated her and look forward to checking her A1c in 2 months.  Essential hypertension, benign -     triamterene-hydrochlorothiazide (DYAZIDE) 37.5-25 MG capsule; TAKE 1 CAPSULE EACH BY MOUTH DAILY.    Marland Kitchen Reviewed expectations re: course of current medical issues. . Discussed self-management of symptoms. . Outlined signs and symptoms indicating need for more acute intervention. . Patient verbalized understanding and all questions were  answered. . See orders for this visit as documented in the electronic medical record. . Patient received an After Visit Summary.   CMA served as Education administrator during this visit. History, Physical, and Plan performed by medical provider. Documentation and orders reviewed and attested to. Briscoe Deutscher, D.O.  Briscoe Deutscher, DO [place], [facility] [place] 07/24/2016  Future Appointments Date Time Provider [place]  09/22/2016 10:45 AM Briscoe Deutscher, DO [facility] None  12/17/2016 10:30 AM Briscoe Deutscher, DO [facility] None  06/29/2017 10:00 AM Stephanie Acre, RN [facility] None

## 2016-08-05 ENCOUNTER — Other Ambulatory Visit: Payer: Self-pay | Admitting: Family Medicine

## 2016-08-05 DIAGNOSIS — E2839 Other primary ovarian failure: Secondary | ICD-10-CM

## 2016-08-06 LAB — HM DIABETES EYE EXAM

## 2016-08-11 ENCOUNTER — Encounter: Payer: Self-pay | Admitting: Family Medicine

## 2016-08-26 ENCOUNTER — Ambulatory Visit
Admission: RE | Admit: 2016-08-26 | Discharge: 2016-08-26 | Disposition: A | Discharge status: Home / Self Care | Payer: Medicare Other | Source: Ambulatory Visit | Attending: Family Medicine | Admitting: Family Medicine

## 2016-08-26 DIAGNOSIS — E2839 Other primary ovarian failure: Secondary | ICD-10-CM

## 2016-08-28 ENCOUNTER — Other Ambulatory Visit: Payer: Self-pay | Admitting: Family Medicine

## 2016-08-30 ENCOUNTER — Telehealth: Payer: Self-pay | Admitting: *Deleted

## 2016-08-30 DIAGNOSIS — N8110 Cystocele, unspecified: Secondary | ICD-10-CM

## 2016-08-30 NOTE — Telephone Encounter (Signed)
Dr. Juleen China pt called and said that she would like a referral to GYN for Prolapsed bladder that she is having issues with. She said she mentioned it when she was in.

## 2016-08-31 NOTE — Telephone Encounter (Signed)
Okay to place referral

## 2016-08-31 NOTE — Addendum Note (Signed)
Addended by: Durwin Glaze on: 08/31/2016 09:02 AM   Modules accepted: Orders

## 2016-08-31 NOTE — Telephone Encounter (Signed)
Referral placed.

## 2016-09-01 ENCOUNTER — Telehealth: Payer: Self-pay | Admitting: Family Medicine

## 2016-09-01 DIAGNOSIS — E1165 Type 2 diabetes mellitus with hyperglycemia: Principal | ICD-10-CM

## 2016-09-01 DIAGNOSIS — IMO0001 Reserved for inherently not codable concepts without codable children: Secondary | ICD-10-CM

## 2016-09-01 DIAGNOSIS — Z794 Long term (current) use of insulin: Principal | ICD-10-CM

## 2016-09-01 MED ORDER — ACCU-CHEK MULTICLIX LANCETS MISC: 12 refills | Status: DC

## 2016-09-01 NOTE — Telephone Encounter (Signed)
Patient requesting script for Accu Check Lancets Catalog [id number].  Has the same Pharmacy [facility] in [place].  Thank you,  -LL

## 2016-09-01 NOTE — Telephone Encounter (Signed)
RX sent to pharmacy  

## 2016-09-02 ENCOUNTER — Telehealth: Payer: Self-pay | Admitting: Obstetrics and Gynecology

## 2016-09-02 NOTE — Telephone Encounter (Signed)
Called and left a message for patient to call back to schedule a new patient doctor referral for prolapse.

## 2016-09-03 ENCOUNTER — Telehealth: Payer: Self-pay | Admitting: Family Medicine

## 2016-09-03 DIAGNOSIS — E1165 Type 2 diabetes mellitus with hyperglycemia: Principal | ICD-10-CM

## 2016-09-03 DIAGNOSIS — Z794 Long term (current) use of insulin: Principal | ICD-10-CM

## 2016-09-03 DIAGNOSIS — IMO0001 Reserved for inherently not codable concepts without codable children: Secondary | ICD-10-CM

## 2016-09-03 MED ORDER — ACCU-CHEK FASTCLIX LANCETS MISC: 2 refills | Status: DC

## 2016-09-03 NOTE — Telephone Encounter (Signed)
Spoke with pharmacy--and changed script.

## 2016-09-03 NOTE — Telephone Encounter (Signed)
[facility] due to being out of the Lancets (ACCU-CHEK MULTICLIX) lancets and needs the fast clicks instead. Please call pharmacy to advise.

## 2016-09-03 NOTE — Telephone Encounter (Signed)
Called and left a message for patient to call back to schedule a new patient doctor referral. °

## 2016-09-06 ENCOUNTER — Other Ambulatory Visit: Payer: Self-pay

## 2016-09-06 MED ORDER — PRAVASTATIN SODIUM 40 MG PO TABS: 40.0000 mg | ORAL_TABLET | Freq: Every day | ORAL | 1 refills | Status: DC

## 2016-09-06 NOTE — Telephone Encounter (Signed)
Called and left a message for patient to call back to schedule a new patient doctor referral. °

## 2016-09-07 NOTE — Telephone Encounter (Signed)
Patient is returning a call to Starla. °

## 2016-09-21 ENCOUNTER — Other Ambulatory Visit: Payer: Self-pay | Admitting: Family Medicine

## 2016-09-21 DIAGNOSIS — R42 Dizziness and giddiness: Secondary | ICD-10-CM

## 2016-09-22 ENCOUNTER — Ambulatory Visit (INDEPENDENT_AMBULATORY_CARE_PROVIDER_SITE_OTHER): Payer: Medicare Other | Admitting: Family Medicine

## 2016-09-22 ENCOUNTER — Encounter: Payer: Self-pay | Admitting: Family Medicine

## 2016-09-22 VITALS — BP 114/72 | HR 86 | Temp 97.6°F | Ht 60.5 in | Wt 147.6 lb

## 2016-09-22 DIAGNOSIS — E1165 Type 2 diabetes mellitus with hyperglycemia: Secondary | ICD-10-CM | POA: Diagnosis not present

## 2016-09-22 DIAGNOSIS — I10 Essential (primary) hypertension: Secondary | ICD-10-CM | POA: Diagnosis not present

## 2016-09-22 DIAGNOSIS — E785 Hyperlipidemia, unspecified: Secondary | ICD-10-CM

## 2016-09-22 LAB — COMPREHENSIVE METABOLIC PANEL
ALT: 16 U/L (ref 0–35)
AST: 18 U/L (ref 0–37)
Albumin: 4.6 g/dL (ref 3.5–5.2)
Alkaline Phosphatase: 64 U/L (ref 39–117)
BUN: 18 mg/dL (ref 6–23)
CO2: 30 mEq/L (ref 19–32)
Calcium: 10.2 mg/dL (ref 8.4–10.5)
Chloride: 98 mEq/L (ref 96–112)
Creatinine, Ser: 1.07 mg/dL (ref 0.40–1.20)
GFR: 53.62 mL/min — ABNORMAL LOW (ref >60.00)
Glucose, Bld: 87 mg/dL (ref 70–99)
Potassium: 3.4 mEq/L — ABNORMAL LOW (ref 3.5–5.1)
Sodium: 140 mEq/L (ref 135–145)
Total Bilirubin: 0.5 mg/dL (ref 0.2–1.2)
Total Protein: 7 g/dL (ref 6.0–8.3)

## 2016-09-22 LAB — LIPID PANEL
Cholesterol: 182 mg/dL (ref 0–200)
HDL: 52.3 mg/dL (ref >39.00)
LDL Cholesterol: 109 mg/dL — ABNORMAL HIGH (ref 0–99)
NonHDL: 129.69
Total CHOL/HDL Ratio: 3
Triglycerides: 103 mg/dL (ref 0.0–149.0)
VLDL: 20.6 mg/dL (ref 0.0–40.0)

## 2016-09-22 LAB — POCT GLYCOSYLATED HEMOGLOBIN (HGB A1C): Hemoglobin A1C: 5.7

## 2016-09-22 NOTE — Progress Notes (Signed)
Misty Cervantes is a 71 y.o. female is here for follow up.  History of Present Illness:   Water quality scientist, CMA, acting as scribe for Dr. Juleen China.  HPI  Patient states she has been following a diet and trying to avoid sweets.  She is excited that she has lost 6 pounds since last visit.  She has been checking her blood sugars at least twice daily.  They have been in the normal range with the exception of a few outliers.  Patient can explain the increased sugars that occur when she eats something sweet or with a lot of carbohydrates.  She states she is feeling well.  No complaints today.    Current symptoms: no polyuria or polydipsia, no chest pain, dyspnea or TIA's, no numbness, tingling or pain in extremities.  Taking medication compliantly without noted sided effects [x]   YES  []   NO  Home glucose monitoring in the range of  80-170.  Episodes of hypoglycemia? []   YES  [x]   NO Maintaining a diabetic diet? [x]   YES  []   NO Trying to exercise on a regular basis? [x]   YES  []   NO  On ACE inhibitor or angiotensin II receptor blocker? []   YES  [x]   NO On Aspirin? [x]   YES  []   NO  Lab Results  Component Value Date   HGBA1C 5.7 09/22/2016    Lab Results  Component Value Date   MICROALBUR 0.9 06/30/2016    Lab Results  Component Value Date   CHOL 182 09/22/2016   HDL 52.30 09/22/2016   LDLCALC 109 (H) 09/22/2016   TRIG 103.0 09/22/2016   CHOLHDL 3 09/22/2016     Wt Readings from Last 3 Encounters:  09/22/16 147 lb 9.6 oz (67 kg)  07/23/16 150 lb 12.8 oz (68.4 kg)  06/28/16 154 lb 6.4 oz (70 kg)   BP Readings from Last 3 Encounters:  09/22/16 114/72  07/23/16 122/76  06/28/16 118/66   Lab Results  Component Value Date   CREATININE 1.07 09/22/2016    Health Maintenance Due  Topic Date Due  . Hepatitis C Screening  06-Aug-1945  . TETANUS/TDAP  12/31/1963  . PNA vac Low Risk Adult (2 of 2 - PCV13) 01/01/2012    PMHx, SurgHx, SocialHx, FamHx, Medications, and Allergies were  reviewed in the Visit Navigator and updated as appropriate.   Patient Active Problem List   Diagnosis Date Noted  . Type 2 diabetes mellitus with hyperglycemia, without long-term current use of insulin ([place]) 06/18/2016  . History of right knee joint replacement 06/17/2016  . History of left hip replacement 06/17/2016  . History of vaginal hysterectomy 06/17/2016  . OA (osteoarthritis) of knee 11/03/2015  . Hyperlipidemia 07/23/2014  . Other and unspecified hyperlipidemia 08/11/2012  . Genu valgum 08/11/2012  . Overweight(278.02) 02/25/2010  . Essential hypertension, benign 02/25/2010   Social History  Substance Use Topics  . Smoking status: Never Smoker  . Smokeless tobacco: Never Used  . Alcohol use No   Current Medications and Allergies:   .  ACCU-CHEK FASTCLIX LANCETS MISC, Check blood sugars 1-2 times per day., Disp: 100 each, Rfl: 2 .  aspirin EC 81 MG tablet, Take 81 mg by mouth daily., Disp: , Rfl:  .  blood glucose meter kit and supplies, Dispense based on patient and insurance preference. Use up to four times daily as directed. (FOR ICD-9 250.00, 250.01)., Disp: 1 each, Rfl: 0 .  fluticasone (FLONASE) 50 MCG/ACT nasal spray, Place 1  spray into both nostrils 2 (two) times daily as needed for allergies or rhinitis., Disp: 16 g, Rfl: 6 .  furosemide (LASIX) 20 MG tablet, Take 1 tablet (20 mg total) by mouth daily. (Patient taking differently: Take 20 mg by mouth as needed for fluid or edema. ), Disp: 30 tablet, Rfl: 0 .  glipiZIDE (GLUCOTROL) 5 MG tablet, Take 1 tablet (5 mg total) by mouth daily before breakfast., Disp: 90 tablet, Rfl: 3 .  glucose blood test strip, To check bg qid, Disp: 100 each, Rfl: 6 .  metFORMIN (GLUCOPHAGE) 1000 MG tablet, Take 1 tablet (1,000 mg total) by mouth 2 (two) times daily with a meal., Disp: 180 tablet, Rfl: 3 .  pravastatin (PRAVACHOL) 40 MG tablet, Take 1 tablet (40 mg total) by mouth daily., Disp: 90 tablet, Rfl: 1  Allergies  Allergen  Reactions  . Probiotic [Acidophilus]    Review of Systems   Review of Systems  Constitutional: Negative for chills, fever, malaise/fatigue and weight loss.  Respiratory: Negative for cough, shortness of breath and wheezing.   Cardiovascular: Negative for chest pain, palpitations and leg swelling.  Gastrointestinal: Negative for abdominal pain, constipation, diarrhea, nausea and vomiting.  Genitourinary: Negative for dysuria and urgency.  Musculoskeletal: Negative for joint pain and myalgias.  Skin: Negative for rash.  Neurological: Negative for dizziness and headaches.  Psychiatric/Behavioral: Negative for depression, substance abuse and suicidal ideas. The patient is not nervous/anxious.    Vitals:   Vitals:   09/22/16 1115  BP: 114/72  Pulse: 86  Temp: 97.6 F (36.4 C)  TempSrc: Oral  SpO2: 97%  Weight: 147 lb 9.6 oz (67 kg)  Height: 5' 0.5" (1.537 m)     Body mass index is 28.35 kg/m.  Physical Exam:   Physical Exam  Constitutional: She appears well-developed and well-nourished. No distress.  HENT:  Head: Normocephalic and atraumatic.  Eyes: EOM are normal. Pupils are equal, round, and reactive to light.  Neck: Normal range of motion. Neck supple.  Cardiovascular: Normal rate, regular rhythm, normal heart sounds and intact distal pulses.   Pulmonary/Chest: Effort normal.  Abdominal: Soft.  Skin: Skin is warm.  Psychiatric: She has a normal mood and affect. Her behavior is normal.  Nursing note and vitals reviewed.   Results for orders placed or performed in visit on 09/22/16  Comprehensive metabolic panel  Result Value Ref Range   Sodium 140 135 - 145 mEq/L   Potassium 3.4 (L) 3.5 - 5.1 mEq/L   Chloride 98 96 - 112 mEq/L   CO2 30 19 - 32 mEq/L   Glucose, Bld 87 70 - 99 mg/dL   BUN 18 6 - 23 mg/dL   Creatinine, Ser 1.07 0.40 - 1.20 mg/dL   Total Bilirubin 0.5 0.2 - 1.2 mg/dL   Alkaline Phosphatase 64 39 - 117 U/L   AST 18 0 - 37 U/L   ALT 16 0 - 35 U/L    Total Protein 7.0 6.0 - 8.3 g/dL   Albumin 4.6 3.5 - 5.2 g/dL   Calcium 10.2 8.4 - 10.5 mg/dL   GFR 53.62 (L) >60.00 mL/min  Lipid panel  Result Value Ref Range   Cholesterol 182 0 - 200 mg/dL   Triglycerides 103.0 0.0 - 149.0 mg/dL   HDL 52.30 >39.00 mg/dL   VLDL 20.6 0.0 - 40.0 mg/dL   LDL Cholesterol 109 (H) 0 - 99 mg/dL   Total CHOL/HDL Ratio 3    NonHDL 129.69   POCT glycosylated hemoglobin (Hb  A1C)  Result Value Ref Range   Hemoglobin A1C 5.7     Assessment and Plan:   Misty Cervantes was seen today for follow-up and diabetes.  Diagnoses and all orders for this visit:  Type 2 diabetes mellitus with hyperglycemia, without long-term current use of insulin ([facility]) Comments:  A1c down from > 10. She was congratulated on doing so well. We reviewed blood sugar log - concern for a few hypoglycemic episodes (60s). Reviewed strategies to minimize those events by including protein with each meal. If low blood sugars continue, plan to decrease glipizide by half.   Lab Results  Component Value Date   HGBA1C 5.7 09/22/2016   Orders: -     POCT glycosylated hemoglobin (Hb A1C) -     Comprehensive metabolic panel -     Lipid panel  Hyperlipidemia, unspecified hyperlipidemia type Comments:  Doing VERY WELL.  Lab Results  Component Value Date   CHOL 182 09/22/2016   CHOL 180 06/30/2016   CHOL 203 (H) 08/25/2015   Lab Results  Component Value Date   HDL 52.30 09/22/2016   HDL 41.70 06/30/2016   HDL 41 08/25/2015   Lab Results  Component Value Date   LDLCALC 109 (H) 09/22/2016   LDLCALC 107 (H) 06/30/2016   LDLCALC 116 (H) 08/25/2015   Lab Results  Component Value Date   TRIG 103.0 09/22/2016   TRIG 157.0 (H) 06/30/2016   TRIG 232 (H) 08/25/2015   Lab Results  Component Value Date   CHOLHDL 3 09/22/2016   CHOLHDL 4 06/30/2016   CHOLHDL 5.0 (H) 08/25/2015   Orders: -     Comprehensive metabolic panel -     Lipid panel  Essential hypertension, benign Comments:  Will  hold ACE for now.   BP Readings from Last 3 Encounters:  09/22/16 114/72  07/23/16 122/76  06/28/16 118/66   Lab Results  Component Value Date   MICROALBUR 0.9 06/30/2016    . Reviewed expectations re: course of current medical issues. . Discussed self-management of symptoms. . Outlined signs and symptoms indicating need for more acute intervention. . Patient verbalized understanding and all questions were answered. Marland Kitchen Health Maintenance issues including appropriate healthy diet, exercise, and smoking avoidance were discussed with patient. . See orders for this visit as documented in the electronic medical record. . Patient received an After Visit Summary.  CMA served as Education administrator during this visit. History, Physical, and Plan performed by medical provider. The above documentation has been reviewed and is accurate and complete. Briscoe Deutscher, D.O.  Briscoe Deutscher, DO [place], [facility] 09/22/2016  Future Appointments Date Time Provider [place]  09/24/2016 2:00 PM Salvadore Dom, MD [place] None  12/17/2016 10:30 AM Briscoe Deutscher, DO [facility] None  12/23/2016 10:30 AM Briscoe Deutscher, DO [facility] None  06/29/2017 10:00 AM Stephanie Acre, RN [facility] None

## 2016-09-23 ENCOUNTER — Telehealth: Payer: Self-pay | Admitting: Surgical

## 2016-09-23 ENCOUNTER — Ambulatory Visit: Payer: Medicare Other | Admitting: Obstetrics and Gynecology

## 2016-09-23 NOTE — Telephone Encounter (Signed)
Notified patient of lab results.  Patient verbalized understanding.  

## 2016-09-24 ENCOUNTER — Ambulatory Visit: Payer: Medicare Other | Admitting: Obstetrics and Gynecology

## 2016-10-20 ENCOUNTER — Ambulatory Visit (INDEPENDENT_AMBULATORY_CARE_PROVIDER_SITE_OTHER): Payer: Medicare Other | Admitting: Family Medicine

## 2016-10-20 ENCOUNTER — Telehealth: Payer: Self-pay | Admitting: Family Medicine

## 2016-10-20 VITALS — HR 81 | Temp 98.2°F | Ht 60.5 in | Wt 146.2 lb

## 2016-10-20 DIAGNOSIS — S20211A Contusion of right front wall of thorax, initial encounter: Secondary | ICD-10-CM

## 2016-10-20 DIAGNOSIS — S8001XA Contusion of right knee, initial encounter: Secondary | ICD-10-CM

## 2016-10-20 DIAGNOSIS — S60221A Contusion of right hand, initial encounter: Secondary | ICD-10-CM | POA: Diagnosis not present

## 2016-10-20 NOTE — Telephone Encounter (Signed)
[place] about a week ago and has rib pain on right side. Sched acute visit w/ Juleen China today 08/08 at 10:45am.  -LL

## 2016-10-20 NOTE — Progress Notes (Signed)
Misty Cervantes is a 71 y.o. female here for an acute visit.  History of Present Illness:   [name] CMA acting as scribe for Dr. Juleen China.  HPI: See Assessment and Plan section for Problem Based Charting of further issues discussed today.  PMHx, SurgHx, SocialHx, Medications, and Allergies were reviewed in the Visit Navigator and updated as appropriate.  Current Medications:   .  ACCU-CHEK FASTCLIX LANCETS MISC, Check blood sugars 1-2 times per day., Disp: 100 each, Rfl: 2 .  aspirin EC 81 MG tablet, Take 81 mg by mouth daily., Disp: , Rfl:  .  blood glucose meter kit and supplies, Dispense based on patient and insurance preference. Use up to four times daily as directed. (FOR ICD-9 250.00, 250.01)., Disp: 1 each, Rfl: 0 .  fluticasone (FLONASE) 50 MCG/ACT nasal spray, Place 1 spray into both nostrils 2 (two) times daily as needed for allergies or rhinitis., Disp: 16 g, Rfl: 6 .  furosemide (LASIX) 20 MG tablet, Take 1 tablet (20 mg total) by mouth daily. (Patient taking differently: Take 20 mg by mouth as needed for fluid or edema. ), Disp: 30 tablet, Rfl: 0 .  glipiZIDE (GLUCOTROL) 5 MG tablet, Take 1 tablet (5 mg total) by mouth daily before breakfast., Disp: 90 tablet, Rfl: 3 .  glucose blood test strip, To check bg qid, Disp: 100 each, Rfl: 6 .  metFORMIN (GLUCOPHAGE) 1000 MG tablet, Take 1 tablet (1,000 mg total) by mouth 2 (two) times daily with a meal., Disp: 180 tablet, Rfl: 3 .  pravastatin (PRAVACHOL) 40 MG tablet, Take 1 tablet (40 mg total) by mouth daily., Disp: 90 tablet, Rfl: 1   Allergies  Allergen Reactions  . Probiotic [Acidophilus]    Review of Systems:   Pertinent items are noted in the HPI. Otherwise, ROS is negative.  Vitals:   Vitals:   10/20/16 1046  Pulse: 81  Temp: 98.2 F (36.8 C)  TempSrc: Oral  SpO2: 97%  Weight: 146 lb 3.2 oz (66.3 kg)  Height: 5' 0.5" (1.537 m)     Body mass index is 28.08 kg/m.  Physical Exam:   Physical Exam    Constitutional: She appears well-nourished.  HENT:  Head: Normocephalic and atraumatic.  Eyes: Pupils are equal, round, and reactive to light. EOM are normal.  Neck: Normal range of motion. Neck supple.  Cardiovascular: Normal rate, regular rhythm, normal heart sounds and intact distal pulses.   Pulmonary/Chest: Effort normal.  Abdominal: Soft.  Musculoskeletal:       Right wrist: She exhibits tenderness.       Right knee: She exhibits ecchymosis and bony tenderness. Tenderness found. Lateral joint line tenderness noted.       Arms: Skin: Skin is warm.  Psychiatric: She has a normal mood and affect. Her behavior is normal.  Nursing note and vitals reviewed.   Assessment and Plan:   Patient comes in today due to a fall at [employer] on August 29 th 2018. Patient denies blacking out before fall. She stated that she felt like her shoe just caught the floor and she fell. She does not think that anything was in the floor to cause the fall. When she fell she landed on her right wrist, right elbow, right knee, and right side. She did have some bruising on the right knee and right hand. She states that pain scale is about a 3 at this time. Patient denies hitting her head. She did say that her glasses come off  her head. She did ice the hand and knee for swelling. She does have some pain in the rib area on the right side when she takes a deep breath. We will not get x rays at this time due to bruising has gotten better and patient stated that pain is continuing to get better.   Kimmarie was seen today for fall.  Diagnoses and all orders for this visit:  Contusion of right knee, initial encounter Comments: Healing. Good ROM. Patient will see her Orthopedist in 1 week. Will likely have xrays at that time.  Contusion of right hand, initial encounter Comments: Bruising resolved. Still ttp at thenar eminence. Will watch. Image if not improving in 1-2 weeks.  Contusion of rib on right side, initial  encounter Comments: No red flags. Symmetric chest wall motion. Lungs CTAB with good air movement. Will monitor.   . Reviewed expectations re: course of current medical issues. . Discussed self-management of symptoms. . Outlined signs and symptoms indicating need for more acute intervention. . Patient verbalized understanding and all questions were answered. Marland Kitchen Health Maintenance issues including appropriate healthy diet, exercise, and smoking avoidance were discussed with patient. . See orders for this visit as documented in the electronic medical record. . Patient received an After Visit Summary.  CMA served as Education administrator during this visit. History, Physical, and Plan performed by medical provider. The above documentation has been reviewed and is accurate and complete. Briscoe Deutscher, D.O.  Briscoe Deutscher, DO [place], [facility] 10/21/2016  Future Appointments Date Time Provider [place]  11/01/2016 2:00 PM Salvadore Dom, MD [place] None  12/17/2016 10:30 AM Briscoe Deutscher, DO [facility] None  12/23/2016 10:30 AM Briscoe Deutscher, DO [facility] None  06/29/2017 10:00 AM Stephanie Acre, RN [facility] None

## 2016-10-20 NOTE — Telephone Encounter (Signed)
Noted  

## 2016-10-21 ENCOUNTER — Encounter: Payer: Self-pay | Admitting: Family Medicine

## 2016-11-01 ENCOUNTER — Encounter: Payer: Self-pay | Admitting: Obstetrics and Gynecology

## 2016-11-01 ENCOUNTER — Ambulatory Visit (INDEPENDENT_AMBULATORY_CARE_PROVIDER_SITE_OTHER): Payer: Medicare Other | Admitting: Obstetrics and Gynecology

## 2016-11-01 VITALS — BP 122/60 | HR 84 | Resp 15 | Ht 60.0 in | Wt 144.0 lb

## 2016-11-01 DIAGNOSIS — N819 Female genital prolapse, unspecified: Secondary | ICD-10-CM

## 2016-11-01 DIAGNOSIS — N8111 Cystocele, midline: Secondary | ICD-10-CM | POA: Diagnosis not present

## 2016-11-01 MED ORDER — NONFORMULARY OR COMPOUNDED ITEM: 0 refills | Status: DC

## 2016-11-01 NOTE — Progress Notes (Signed)
71 y.o. [id number] DivorcedCaucasianF here referred by PCP for vaginal prolapse.  She had a TVH within the last 11 years and had her "bladder tacked up". She felt like her bladder was falling about 6 months after the surgery. She used a pessary for a while, stopped using it secondary to bleeding from vaginal irritation. She has estrogen cream that she used for a year, it wasn't helping so she stopped using it (doesn't think she used the estrogen with the pessary). The prolapse is so bad that it is outside of her body during the day. She feels a bulge, almost feels like she has a big pad there.  She has some urge incontinence, mostly in the morning with a full bladder. Can empty her bladder with the incontinence. Rare GSI. She will occasionally wear a pad. She feels she is emptying her bladder. Bowel movements are normal. No vaginal bleeding.  Not sexually active currently. She is the guardian of 2 of her grand children, 10 and 14.   She was diagnosed with diabetes in April, 2018. Hgb A1C went from 10.6 to 5.7 since April.     No LMP recorded. Patient has had a hysterectomy.          Sexually active: No.  The current method of family planning is status post hysterectomy.    Exercising: Yes.    walking/ bike at [facility]  Smoker:  no  Health Maintenance: Pap:  Unsure- hysterectomy about 10 years ago  History of abnormal Pap:  no MMG:  04-12-16 DX MMG- repeat in DX MMG12 months Colonoscopy:  11-17-11 polyp repeat in 5 yrs  BMD:   08-26-16 Normal TDaP:  unsure Gardasil: N/A   reports that she has never smoked. She has never used smokeless tobacco. She reports that she does not drink alcohol or use drugs. 2 sons, oldest passed away last year with drug problems. Her oldest sons 2 kids live with her.  6 grand kids.   Past Medical History:  Diagnosis Date  . Anemia   . Cataract   . Diabetes mellitus without complication ([place])   . Gastric ulcer 11/17/2011  . History of total left hip replacement 2011  .  HTN (hypertension)   . Hx of total knee replacement 2017   11-03-2015   . Hyperlipemia   . Hyperplastic polyp of intestine 11/17/2011  . Hypertension   . Measles   . Menopause   . Migraine   . Osteoarthritis     Past Surgical History:  Procedure Laterality Date  . ABDOMINAL HYSTERECTOMY    . CARPAL TUNNEL RELEASE Right   . CATARACT EXTRACTION Bilateral   . HIP ARTHROPLASTY Left 2011  . JOINT REPLACEMENT    . RETINAL DETACHMENT SURGERY    . TOTAL KNEE ARTHROPLASTY Right 11/03/2015   Procedure: RIGHT TOTAL KNEE ARTHROPLASTY;  Surgeon: Gaynelle Arabian, MD;  Location: [facility];  Service: Orthopedics;  Laterality: Right;  . TOTAL VAGINAL HYSTERECTOMY  2012     Current Outpatient Prescriptions  Medication Sig Dispense Refill  . ACCU-CHEK FASTCLIX LANCETS MISC Check blood sugars 1-2 times per day. 100 each 2  . aspirin EC 81 MG tablet Take 81 mg by mouth daily.    . blood glucose meter kit and supplies Dispense based on patient and insurance preference. Use up to four times daily as directed. (FOR ICD-9 250.00, 250.01). 1 each 0  . fluticasone (FLONASE) 50 MCG/ACT nasal spray Place 1 spray into both nostrils 2 (two) times daily as needed  for allergies or rhinitis. 16 g 6  . glipiZIDE (GLUCOTROL) 5 MG tablet Take 1 tablet (5 mg total) by mouth daily before breakfast. 90 tablet 3  . glucose blood test strip To check bg qid 100 each 6  . metFORMIN (GLUCOPHAGE) 1000 MG tablet Take 1 tablet (1,000 mg total) by mouth 2 (two) times daily with a meal. 180 tablet 3  . pravastatin (PRAVACHOL) 40 MG tablet Take 1 tablet (40 mg total) by mouth daily. 90 tablet 1  . meclizine (ANTIVERT) 25 MG tablet TAKE 1 TABLET (25 MG TOTAL) BY MOUTH 3 (THREE) TIMES DAILY AS NEEDED FOR DIZZINESS.  0   No current facility-administered medications for this visit.     Family History  Problem Relation Age of Onset  . Diabetes Mother        deceased  . Heart attack Mother   . Diabetes Sister        x2  . Kidney  cancer Father   . Heart attack Father   . Diabetes Sister     Review of Systems  Constitutional: Negative.   HENT: Negative.   Eyes: Negative.   Respiratory: Negative.   Cardiovascular: Negative.   Gastrointestinal: Negative.   Endocrine: Negative.   Genitourinary:       Vaginal prolapse   Musculoskeletal: Negative.   Skin: Negative.   Allergic/Immunologic: Negative.   Neurological: Negative.   Psychiatric/Behavioral: Negative.     Exam:   BP 122/60 (BP Location: Right Arm, Patient Position: Sitting, Cuff Size: Normal)   Pulse 84   Resp 15   Ht 5' (1.524 m)   Wt 144 lb (65.3 kg)   BMI 28.12 kg/m   Weight change: '@WEIGHTCHANGE'$ @ Height:   Height: 5' (152.4 cm)  Ht Readings from Last 3 Encounters:  11/01/16 5' (1.524 m)  10/20/16 5' 0.5" (1.537 m)  09/22/16 5' 0.5" (1.537 m)    General appearance: alert, cooperative and appears stated age Head: Normocephalic, without obvious abnormality, atraumatic Abdomen: soft, non-tender; bowel sounds normal; no masses,  no organomegaly   Pelvic: External genitalia:  no lesions              Urethra:  normal appearing urethra with no masses, tenderness or lesions              Bartholins and Skenes: normal                 Vagina: normal atrophic appearing vagina with normal color and discharge, no lesions              Cervix: absent               Bimanual Exam:  Uterus:  uterus absent              Adnexa: no mass, fullness, tenderness               Chaperone was present for exam.  A:  Genital prolapse, grade 3 cystocele, grade 1 vault prolapse  P: We discussed the option: do nothing, pessary, surgery (cystocele repair or sacrocolpopexy with cystocele repair). Discussed the risk of recurrent prolapse     She would like to retry the pessary  She has a pessary at home, stopped using it secondary to vaginal irritation  Will start vaginal estrogen (will get a compounded form to keep the cost down)  She will use the estrogen every  night for a few days, then change to 2 x a week  She will  insert her pessary and f/u in about a week  We can try a different pessary if needed  CC: Dr Briscoe Deutscher

## 2016-11-01 NOTE — Patient Instructions (Signed)

## 2016-11-02 ENCOUNTER — Telehealth: Payer: Self-pay | Admitting: Obstetrics and Gynecology

## 2016-11-02 NOTE — Telephone Encounter (Signed)
Spoke with patient. Patient request that Dr. Talbert Nan send in the compounded vaginal cream in the smallest quanity instead of several months. Patient states she wants to try the medication first.   Advised patient Rx for compounded vaginal estrogen was sent on 8/20 to [facility] for 3 month supply. Advised patient to f/u with pharmacy for cost and request 1 month supply vs 3. [employer] number provided. Advised patient to return call to office with any additional questions or if additional assistance needed. Patient thankful and verbalizes understanding.   Routing to provider for final review. Patient is agreeable to disposition. Will close encounter.

## 2016-11-02 NOTE — Telephone Encounter (Signed)
Patient was seen yesterday and is asking for the compound prescription to only have enough refills until she is to return for the recheck. Patient does not want to pay for more refills then she will need.

## 2016-11-16 ENCOUNTER — Encounter: Payer: Self-pay | Admitting: Family Medicine

## 2016-12-17 ENCOUNTER — Encounter: Payer: Self-pay | Admitting: Family Medicine

## 2016-12-17 ENCOUNTER — Ambulatory Visit (INDEPENDENT_AMBULATORY_CARE_PROVIDER_SITE_OTHER): Payer: Medicare Other | Admitting: Family Medicine

## 2016-12-17 VITALS — BP 124/68 | HR 81 | Temp 98.9°F | Wt 143.2 lb

## 2016-12-17 DIAGNOSIS — E1165 Type 2 diabetes mellitus with hyperglycemia: Secondary | ICD-10-CM | POA: Diagnosis not present

## 2016-12-17 DIAGNOSIS — I10 Essential (primary) hypertension: Secondary | ICD-10-CM

## 2016-12-17 DIAGNOSIS — E785 Hyperlipidemia, unspecified: Secondary | ICD-10-CM | POA: Diagnosis not present

## 2016-12-17 DIAGNOSIS — E663 Overweight: Secondary | ICD-10-CM | POA: Diagnosis not present

## 2016-12-17 DIAGNOSIS — E1159 Type 2 diabetes mellitus with other circulatory complications: Secondary | ICD-10-CM | POA: Diagnosis not present

## 2016-12-17 DIAGNOSIS — E1169 Type 2 diabetes mellitus with other specified complication: Secondary | ICD-10-CM | POA: Diagnosis not present

## 2016-12-17 DIAGNOSIS — I152 Hypertension secondary to endocrine disorders: Secondary | ICD-10-CM

## 2016-12-17 LAB — POCT GLYCOSYLATED HEMOGLOBIN (HGB A1C): Hemoglobin A1C: 5.6

## 2016-12-17 NOTE — Progress Notes (Signed)
 Misty Cervantes is a 70 y.o. female is here for follow up.  History of Present Illness:   HPI:  1. Type 2 diabetes mellitus with hyperglycemia, without long-term current use of insulin (HCC).  Current symptoms: no polyuria or polydipsia, no chest pain, dyspnea or TIA's, no numbness, tingling or pain in extremities.  Taking medication compliantly without noted sided effects [x]  YES  []  NO  Home glucose monitoring in the range of  90-146.  Episodes of hypoglycemia? []  YES  [x]  NO Maintaining a diabetic diet? [x]  YES  []  NO Trying to exercise on a regular basis? [x]  YES  []  NO  On ACE inhibitor or angiotensin II receptor blocker? []  YES  [x]  NO On Aspirin? [x]  YES  []  NO  Lab Results  Component Value Date   HGBA1C 5.6 12/17/2016    Lab Results  Component Value Date   MICROALBUR 0.9 06/30/2016    Lab Results  Component Value Date   CHOL 182 09/22/2016   HDL 52.30 09/22/2016   LDLCALC 109 (H) 09/22/2016   TRIG 103.0 09/22/2016   CHOLHDL 3 09/22/2016     Wt Readings from Last 3 Encounters:  12/17/16 143 lb 4 oz (65 kg)  11/01/16 144 lb (65.3 kg)  10/20/16 146 lb 3.2 oz (66.3 kg)   BP Readings from Last 3 Encounters:  12/17/16 124/68  11/01/16 122/60  09/22/16 114/72   Lab Results  Component Value Date   CREATININE 1.07 09/22/2016     2. Hypertension associated with diabetes (HCC).   Avoiding excessive salt intake? [x]  YES  []  NO Trying to exercise on a regular basis? []  YES  [x]  NO Review: taking medications as instructed, no medication side effects noted, no TIAs, no chest pain on exertion, no dyspnea on exertion, no swelling of ankles.   BP Readings from Last 3 Encounters:  12/17/16 124/68  11/01/16 122/60  09/22/16 114/72    3. Hyperlipidemia associated with type 2 diabetes mellitus (HCC).   Is the patient taking medications without problems? []  YES  []  NO Does the patient complain of muscle aches?   []  YES  []   NO Trying to exercise  on a regular basis? []  YES  []  NO Diet Compliance: compliant most of the time. Concerns: none. Cardiovascular ROS: no chest pain or dyspnea on exertion.   Lipids:    Component Value Date/Time   CHOL 182 09/22/2016 1158   CHOL 203 (H) 08/25/2015 0955   CHOL 203 (H) 08/11/2012 1228   TRIG 103.0 09/22/2016 1158   TRIG 200 (H) 12/14/2012 1137   TRIG 173 (H) 08/11/2012 1228   HDL 52.30 09/22/2016 1158   HDL 41 08/25/2015 0955   HDL 46 12/14/2012 1137   HDL 42 08/11/2012 1228   VLDL 20.6 09/22/2016 1158   CHOLHDL 3 09/22/2016 1158     Health Maintenance Due  Topic Date Due  . Hepatitis C Screening  05/12/1945  . TETANUS/TDAP  12/31/1963  . PNA vac Low Risk Adult (2 of 2 - PCV13) 01/01/2012   Depression screen PHQ 2/9 07/22/2016 06/28/2016 09/29/2015  Decreased Interest 0 0 0  Down, Depressed, Hopeless 0 0 0  PHQ - 2 Score 0 0 0   PMHx, SurgHx, SocialHx, FamHx, Medications, and Allergies were reviewed in the Visit Navigator and updated as appropriate.   Patient Active Problem List     Diagnosis Date Noted  . Type 2 diabetes mellitus with hyperglycemia, without long-term current use of insulin ([place]) 06/18/2016  . History of right knee joint replacement 06/17/2016  . History of left hip replacement 06/17/2016  . History of vaginal hysterectomy 06/17/2016  . OA (osteoarthritis) of knee 11/03/2015  . Hyperlipidemia associated with type 2 diabetes mellitus ([place]) 07/23/2014  . Genu valgum 08/11/2012  . Overweight 02/25/2010  . Hypertension associated with diabetes ([place]) 02/25/2010   Social History  Substance Use Topics  . Smoking status: Never Smoker  . Smokeless tobacco: Never Used  . Alcohol use No   Current Medications and Allergies:   .  ACCU-CHEK FASTCLIX LANCETS MISC, Check blood sugars 1-2 times per day., Disp: 100 each, Rfl: 2 .  aspirin EC 81 MG tablet, Take 81 mg by mouth daily., Disp: , Rfl:  .  blood glucose meter kit and supplies, Dispense based on patient and  insurance preference. Use up to four times daily as directed. (FOR ICD-9 250.00, 250.01)., Disp: 1 each, Rfl: 0 .  fluticasone (FLONASE) 50 MCG/ACT nasal spray, Place 1 spray into both nostrils 2 (two) times daily as needed for allergies or rhinitis., Disp: 16 g, Rfl: 6 .  glipiZIDE (GLUCOTROL) 5 MG tablet, Take 1 tablet (5 mg total) by mouth daily before breakfast., Disp: 90 tablet, Rfl: 3 .  glucose blood test strip, To check bg qid, Disp: 100 each, Rfl: 6 .  meclizine (ANTIVERT) 25 MG tablet, TAKE 1 TABLET (25 MG TOTAL) BY MOUTH 3 (THREE) TIMES DAILY AS NEEDED FOR DIZZINESS., Disp: , Rfl: 0 .  metFORMIN (GLUCOPHAGE) 1000 MG tablet, Take 1 tablet (1,000 mg total) by mouth 2 (two) times daily with a meal., Disp: 180 tablet, Rfl: 3 .  NONFORMULARY OR COMPOUNDED ITEM, Estradiol cream 0.02%, use 1/2 a gram vaginally qhs x 5 days, the 2 x a week at hs. 3 month supply sent, no refills, Disp: 3 each, Rfl: 0 .  pravastatin (PRAVACHOL) 40 MG tablet, Take 1 tablet (40 mg total) by mouth daily., Disp: 90 tablet, Rfl: 1   Allergies  Allergen Reactions  . Probiotic [Acidophilus]    Review of Systems   Pertinent items are noted in the HPI. Otherwise, ROS is negative.  Vitals:   Vitals:   12/17/16 1057  BP: 124/68  Pulse: 81  Temp: 98.9 F (37.2 C)  TempSrc: Oral  SpO2: 96%  Weight: 143 lb 4 oz (65 kg)     Body mass index is 27.98 kg/m.   Physical Exam:   Physical Exam  Constitutional: She appears well-nourished.  HENT:  Head: Normocephalic and atraumatic.  Eyes: Pupils are equal, round, and reactive to light. EOM are normal.  Neck: Normal range of motion. Neck supple.  Cardiovascular: Normal rate, regular rhythm, normal heart sounds and intact distal pulses.   Pulmonary/Chest: Effort normal.  Abdominal: Soft.  Skin: Skin is warm.  Psychiatric: She has a normal mood and affect. Her behavior is normal.  Nursing note and vitals reviewed.   Results for orders placed or performed in  visit on 12/17/16  POCT glycosylated hemoglobin (Hb A1C)  Result Value Ref Range   Hemoglobin A1C 5.6    Assessment and Plan:   Misty Cervantes was seen today for follow-up.  Diagnoses and all orders for this visit:  Type 2 diabetes mellitus with hyperglycemia, without long-term current use of insulin (HCC) Comments: As usual, she is doing amazingly well. Follow up in 6 months. Orders: -  POCT glycosylated hemoglobin (Hb A1C)  Hypertension associated with diabetes (HCC) Comments: Doing well.  Hyperlipidemia associated with type 2 diabetes mellitus (HCC) Comments: Doing well.  Overweight Comments: Exercising regularly. Eating well.    . Reviewed expectations re: course of current medical issues. . Discussed self-management of symptoms. . Outlined signs and symptoms indicating need for more acute intervention. . Patient verbalized understanding and all questions were answered. . Health Maintenance issues including appropriate healthy diet, exercise, and smoking avoidance were discussed with patient. . See orders for this visit as documented in the electronic medical record. . Patient received an After Visit Summary.  Erica Wallace, DO [facility] 12/17/2016  Future Appointments Date Time Provider Department Center  12/23/2016 10:30 AM Wallace, Erica, DO [facility] None  06/29/2017 10:00 AM Drummond, Cassandra J, RN [facility] None      

## 2016-12-17 NOTE — Patient Instructions (Signed)
Lab Results  Component Value Date   HGBA1C 5.6 12/17/2016

## 2016-12-23 ENCOUNTER — Ambulatory Visit: Payer: Medicare Other | Admitting: Family Medicine

## 2017-01-07 ENCOUNTER — Other Ambulatory Visit: Payer: Self-pay | Admitting: Family Medicine

## 2017-01-07 DIAGNOSIS — R42 Dizziness and giddiness: Secondary | ICD-10-CM

## 2017-01-07 NOTE — Telephone Encounter (Signed)
Please advise on refill. Historical provider.  

## 2017-01-07 NOTE — Telephone Encounter (Signed)
Okay 

## 2017-01-30 ENCOUNTER — Other Ambulatory Visit: Payer: Self-pay | Admitting: Family Medicine

## 2017-01-30 DIAGNOSIS — E1165 Type 2 diabetes mellitus with hyperglycemia: Secondary | ICD-10-CM

## 2017-02-15 ENCOUNTER — Telehealth: Payer: Self-pay | Admitting: Obstetrics and Gynecology

## 2017-02-15 NOTE — Telephone Encounter (Signed)
Spoke with patient. Patient states that she has been using Estradiol cream vaginally twice a week. States she has noticed some light vaginally bleeding. Felt it may be from the applicator, but has noticed it on days when she does not use the Estradiol. Denies any pain or heavy bleeding. Advised she will need to be seen for further evaluation. Patient is agreeable. Declines appointment for today. Appointment scheduled for 02/17/2017 at 10:30 am with Dr.Jertson. Aware if symptoms worsen or develops new symptoms will need to be seen earlier in office. Patient is agreeable.  Routing to provider for final review. Patient agreeable to disposition. Will close encounter.

## 2017-02-15 NOTE — Telephone Encounter (Signed)
Patient was seen for a prolapsed bladder and put on medication. She noticed today that she has started bleeding.

## 2017-02-17 ENCOUNTER — Other Ambulatory Visit: Payer: Self-pay

## 2017-02-17 ENCOUNTER — Encounter: Payer: Self-pay | Admitting: Obstetrics and Gynecology

## 2017-02-17 ENCOUNTER — Ambulatory Visit (INDEPENDENT_AMBULATORY_CARE_PROVIDER_SITE_OTHER): Payer: Medicare Other | Admitting: Obstetrics and Gynecology

## 2017-02-17 VITALS — BP 138/78 | HR 88 | Resp 16 | Wt 144.0 lb

## 2017-02-17 DIAGNOSIS — N950 Postmenopausal bleeding: Secondary | ICD-10-CM | POA: Diagnosis not present

## 2017-02-17 NOTE — Progress Notes (Signed)
GYNECOLOGY  VISIT   HPI: 71 y.o.   Divorced  Caucasian  female   G2P2002 with No LMP recorded. Patient has had a hysterectomy.   here c/o vaginal spotting X 3 days  The patient has a h/o a TVH and bladder surgery. She has genital prolapse, she restarted using the pessary in 9/18. A few weeks later she started vaginal estrogen. She felt she was prolapsing around the pessary, she stopped using it after a couple of weeks. Off and on she would use it for a day or so. She hasn't used it for 4-5 weeks. She started spotting 3 days ago, not sure if she scratched herself. Minimal, notices it when she wipes and a small amount on the pad. No urinary c/o. Voids very well in the am, she will leak on the way to the bathroom. The rest of the day she is usually okay. Voids frequently, thinks she empties her bladder. No GSI. With the pessary in she leaks less. Normal BM.  GYNECOLOGIC HISTORY: No LMP recorded. Patient has had a hysterectomy. Contraception:hysterectomy  Menopausal hormone therapy: none         OB History    Gravida Para Term Preterm AB Living   2 2 2     2    SAB TAB Ectopic Multiple Live Births           2         Patient Active Problem List   Diagnosis Date Noted  . Type 2 diabetes mellitus with hyperglycemia, without long-term current use of insulin ([place]) 06/18/2016  . History of right knee joint replacement 06/17/2016  . History of left hip replacement 06/17/2016  . History of vaginal hysterectomy 06/17/2016  . OA (osteoarthritis) of knee 11/03/2015  . Hyperlipidemia associated with type 2 diabetes mellitus ([place]) 07/23/2014  . Genu valgum 08/11/2012  . Overweight 02/25/2010  . Hypertension associated with diabetes ([place]) 02/25/2010    Past Medical History:  Diagnosis Date  . Anemia   . Cataract   . Diabetes mellitus without complication ([place])   . Gastric ulcer 11/17/2011  . History of total left hip replacement 2011  . HTN (hypertension)   . Hx of total knee replacement  2017   11-03-2015   . Hyperlipemia   . Hyperplastic polyp of intestine 11/17/2011  . Hypertension   . Measles   . Menopause   . Migraine   . Osteoarthritis     Past Surgical History:  Procedure Laterality Date  . ABDOMINAL HYSTERECTOMY    . CARPAL TUNNEL RELEASE Right   . CATARACT EXTRACTION Bilateral   . HIP ARTHROPLASTY Left 2011  . JOINT REPLACEMENT    . RETINAL DETACHMENT SURGERY    . TOTAL KNEE ARTHROPLASTY Right 11/03/2015   Procedure: RIGHT TOTAL KNEE ARTHROPLASTY;  Surgeon: Gaynelle Arabian, MD;  Location: [facility];  Service: Orthopedics;  Laterality: Right;  . TOTAL VAGINAL HYSTERECTOMY  2012     Current Outpatient Medications  Medication Sig Dispense Refill  . ACCU-CHEK FASTCLIX LANCETS MISC Check blood sugars 1-2 times per day. 100 each 2  . ACCU-CHEK GUIDE test strip TEST FOUR TIMES DAILY. 100 each 6  . aspirin EC 81 MG tablet Take 81 mg by mouth daily.    . blood glucose meter kit and supplies Dispense based on patient and insurance preference. Use up to four times daily as directed. (FOR ICD-9 250.00, 250.01). 1 each 0  . fluticasone (FLONASE) 50 MCG/ACT nasal spray Place 1 spray into both  nostrils 2 (two) times daily as needed for allergies or rhinitis. 16 g 6  . glipiZIDE (GLUCOTROL) 5 MG tablet Take 1 tablet (5 mg total) by mouth daily before breakfast. 90 tablet 3  . meclizine (ANTIVERT) 25 MG tablet TAKE 1 TABLET (25 MG TOTAL) BY MOUTH 3 (THREE) TIMES DAILY AS NEEDED FOR DIZZINESS. 30 tablet 0  . metFORMIN (GLUCOPHAGE) 1000 MG tablet Take 1 tablet (1,000 mg total) by mouth 2 (two) times daily with a meal. 180 tablet 3  . NONFORMULARY OR COMPOUNDED ITEM Estradiol cream 0.02%, use 1/2 a gram vaginally qhs x 5 days, the 2 x a week at hs. 3 month supply sent, no refills 3 each 0  . pravastatin (PRAVACHOL) 40 MG tablet Take 1 tablet (40 mg total) by mouth daily. 90 tablet 1   No current facility-administered medications for this visit.      ALLERGIES: Probiotic  [acidophilus]  Family History  Problem Relation Age of Onset  . Diabetes Mother        deceased  . Heart attack Mother   . Diabetes Sister        x2  . Kidney cancer Father   . Heart attack Father   . Diabetes Sister     Social History   Socioeconomic History  . Marital status: Divorced    Spouse name: Not on file  . Number of children: Not on file  . Years of education: Not on file  . Highest education level: Not on file  Social Needs  . Financial resource strain: Not on file  . Food insecurity - worry: Not on file  . Food insecurity - inability: Not on file  . Transportation needs - medical: Not on file  . Transportation needs - non-medical: Not on file  Occupational History  . Occupation: retired    Fish farm manager: RETIRED  Tobacco Use  . Smoking status: Never Smoker  . Smokeless tobacco: Never Used  Substance and Sexual Activity  . Alcohol use: No  . Drug use: No  . Sexual activity: No    Partners: Male    Birth control/protection: Post-menopausal, Surgical  Other Topics Concern  . Not on file  Social History Narrative   Divorced, 2 children; retired.     Review of Systems  Constitutional: Negative.   HENT: Negative.   Eyes: Negative.   Respiratory: Negative.   Cardiovascular: Negative.   Gastrointestinal: Negative.   Genitourinary:       Vaginal spotting  Small amount of vaginal discharge  Musculoskeletal: Negative.   Skin: Negative.   Neurological: Negative.   Endo/Heme/Allergies: Negative.   Psychiatric/Behavioral: Negative.     PHYSICAL EXAMINATION:    BP 138/78 (BP Location: Right Arm, Patient Position: Sitting, Cuff Size: Normal)   Pulse 88   Resp 16   Wt 144 lb (65.3 kg)   BMI 28.12 kg/m     General appearance: alert, cooperative and appears stated age  Pelvic: External genitalia:  no lesions              Urethra:  normal appearing urethra with no masses, tenderness or lesions              Bartholins and Skenes: normal                  Vagina: normal appearing atrophic vagina with grade 3 cystocele and grade 1 vault prolapse, no obvious source of bleeding noted. Looks irritated to the right of the urethra.  Cervix: absent              Bimanual Exam:  Uterus:  uterus absent              Adnexa: no mass, fullness, tenderness   Fitted with a #5 ring pessary with support, comfortable in the office.                Chaperone was present for exam.  ASSESSMENT Vaginal spotting, suspect from atrophy/irritation, no source seen on exam Genital prolapse, current pessary is too small    PLAN Fitted with a #5 ring pessary, will need to order one (patient can just pick it up) Return 1-2 weeks later for f/u She will restart the vaginal estrogen 2 x a week   An After Visit Summary was printed and given to the patient.  In addition to the pessary fitting, ~15 minutes face to face time of which over 50% was spent in counseling.

## 2017-02-18 ENCOUNTER — Encounter: Payer: Self-pay | Admitting: Obstetrics and Gynecology

## 2017-02-22 ENCOUNTER — Telehealth: Payer: Self-pay | Admitting: *Deleted

## 2017-02-22 ENCOUNTER — Other Ambulatory Visit: Payer: Self-pay | Admitting: Family Medicine

## 2017-02-22 NOTE — Telephone Encounter (Signed)
-----   Message from Salvadore Dom, MD sent at 02/18/2017  5:12 PM EST ----- HI Gay Filler, Please order a #5 ring pessary with support for this patient. She knows how to insert it, so she can just pick it up. Please have her come for a f/u pessary check one week after she picks it up. Thanks! Sharee Pimple

## 2017-02-22 NOTE — Telephone Encounter (Signed)
Call to patient. Advised order for pessary has arrived and ready for pick-up. Patient is unsure when she can get out to pick up due to winter weather. She will schedule the one week follow-up when she picks up the pessary.  Routing to provider for final review. Patient agreeable to disposition. Will close encounter.

## 2017-03-03 ENCOUNTER — Encounter: Payer: Self-pay | Admitting: Obstetrics and Gynecology

## 2017-03-03 ENCOUNTER — Other Ambulatory Visit: Payer: Self-pay

## 2017-03-03 ENCOUNTER — Ambulatory Visit: Payer: Medicare Other | Admitting: Obstetrics and Gynecology

## 2017-03-03 VITALS — BP 148/76 | HR 88 | Resp 16 | Wt 146.0 lb

## 2017-03-03 DIAGNOSIS — Z4689 Encounter for fitting and adjustment of other specified devices: Secondary | ICD-10-CM | POA: Diagnosis not present

## 2017-03-03 NOTE — Progress Notes (Signed)
GYNECOLOGY  VISIT   HPI: 71 y.o.   Divorced  Caucasian  female   G2P2002 with No LMP recorded. Patient has had a hysterectomy.   here for pessary check. She was recently fitted with a #5 ring pessary with support. She has had the pessary in for the last 6 days. Feels comfortable, no bleeding, no abnormal d/c. Voiding better. Using the estrogen 2 x a week.    She hasn't taken the pessary out since she put it in.   GYNECOLOGIC HISTORY: No LMP recorded. Patient has had a hysterectomy. Contraception:hysterectomy  Menopausal hormone therapy: none        OB History    Gravida Para Term Preterm AB Living   _0 SAB TAB Ectopic Multiple Live Births           2         Patient Active Problem List   Diagnosis Date Noted  . Type 2 diabetes mellitus with hyperglycemia, without long-term current use of insulin ([place]) 06/18/2016  . History of right knee joint replacement 06/17/2016  . History of left hip replacement 06/17/2016  . History of vaginal hysterectomy 06/17/2016  . OA (osteoarthritis) of knee 11/03/2015  . Hyperlipidemia associated with type 2 diabetes mellitus ([place]) 07/23/2014  . Genu valgum 08/11/2012  . Overweight 02/25/2010  . Hypertension associated with diabetes ([place]) 02/25/2010    Past Medical History:  Diagnosis Date  . Anemia   . Cataract   . Diabetes mellitus without complication ([place])   . Gastric ulcer 11/17/2011  . History of total left hip replacement 2011  . HTN (hypertension)   . Hx of total knee replacement 2017   11-03-2015   . Hyperlipemia   . Hyperplastic polyp of intestine 11/17/2011  . Hypertension   . Measles   . Menopause   . Migraine   . Osteoarthritis     Past Surgical History:  Procedure Laterality Date  . ABDOMINAL HYSTERECTOMY    . CARPAL TUNNEL RELEASE Right   . CATARACT EXTRACTION Bilateral   . HIP ARTHROPLASTY Left 2011  . JOINT REPLACEMENT    . RETINAL DETACHMENT SURGERY    . TOTAL KNEE ARTHROPLASTY Right 11/03/2015    Procedure: RIGHT TOTAL KNEE ARTHROPLASTY;  Surgeon: Gaynelle Arabian, MD;  Location: [facility];  Service: Orthopedics;  Laterality: Right;  . TOTAL VAGINAL HYSTERECTOMY  2012     Current Outpatient Medications  Medication Sig Dispense Refill  . ACCU-CHEK FASTCLIX LANCETS MISC Check blood sugars 1-2 times per day. 100 each 2  . ACCU-CHEK GUIDE test strip TEST FOUR TIMES DAILY. 100 each 6  . aspirin EC 81 MG tablet Take 81 mg by mouth daily.    . blood glucose meter kit and supplies Dispense based on patient and insurance preference. Use up to four times daily as directed. (FOR ICD-9 250.00, 250.01). 1 each 0  . fluticasone (FLONASE) 50 MCG/ACT nasal spray Place 1 spray into both nostrils 2 (two) times daily as needed for allergies or rhinitis. 16 g 6  . glipiZIDE (GLUCOTROL) 5 MG tablet Take 1 tablet (5 mg total) by mouth daily before breakfast. 90 tablet 3  . meclizine (ANTIVERT) 25 MG tablet TAKE 1 TABLET (25 MG TOTAL) BY MOUTH 3 (THREE) TIMES DAILY AS NEEDED FOR DIZZINESS. 30 tablet 0  . metFORMIN (GLUCOPHAGE) 1000 MG tablet Take 1 tablet (1,000 mg total) by mouth 2 (two) times daily with a meal. 180 tablet 3  .  NONFORMULARY OR COMPOUNDED ITEM Estradiol cream 0.02%, use 1/2 a gram vaginally qhs x 5 days, the 2 x a week at hs. 3 month supply sent, no refills 3 each 0  . pravastatin (PRAVACHOL) 40 MG tablet Take 1 tablet (40 mg total) by mouth daily. 90 tablet 1   No current facility-administered medications for this visit.      ALLERGIES: Probiotic [acidophilus]  Family History  Problem Relation Age of Onset  . Diabetes Mother        deceased  . Heart attack Mother   . Diabetes Sister        x2  . Kidney cancer Father   . Heart attack Father   . Diabetes Sister     Social History   Socioeconomic History  . Marital status: Divorced    Spouse name: Not on file  . Number of children: Not on file  . Years of education: Not on file  . Highest education level: Not on file  Social Needs   . Financial resource strain: Not on file  . Food insecurity - worry: Not on file  . Food insecurity - inability: Not on file  . Transportation needs - medical: Not on file  . Transportation needs - non-medical: Not on file  Occupational History  . Occupation: retired    Fish farm manager: RETIRED  Tobacco Use  . Smoking status: Never Smoker  . Smokeless tobacco: Never Used  Substance and Sexual Activity  . Alcohol use: No  . Drug use: No  . Sexual activity: No    Partners: Male    Birth control/protection: Post-menopausal, Surgical  Other Topics Concern  . Not on file  Social History Narrative   Divorced, 2 children; retired.     Review of Systems  Constitutional: Negative.   HENT: Positive for sinus pain.        Headahce  Eyes: Negative.   Respiratory: Negative.   Cardiovascular: Negative.   Gastrointestinal: Negative.   Genitourinary: Negative.   Musculoskeletal: Negative.   Skin: Negative.   Neurological: Positive for headaches.  Endo/Heme/Allergies: Negative.     PHYSICAL EXAMINATION:    BP (!) 148/76 (BP Location: Right Arm, Patient Position: Sitting, Cuff Size: Normal)   Pulse 88   Resp 16   Wt 146 lb (66.2 kg)   BMI 28.51 kg/m     General appearance: alert, cooperative and appears stated age  Pelvic: External genitalia:  no lesions              Urethra:  normal appearing urethra with no masses, tenderness or lesions              Bartholins and Skenes: normal                 Vagina: the pessary was removed and cleaned, slight erythema in the posterior vagina. The pessary was replaced.              Cervix: absent Chaperone was present for exam.  ASSESSMENT Fitted with a new #5 ring pessary with support, very comfortable, voiding better Very slight vaginal erythema from the pessary    PLAN She will take the pessary out 2 x a week at bedtime, use the estrogen cream, then replace the pessary in the am F/U pessary check in 2 weeks   An After Visit Summary was  printed and given to the patient.

## 2017-03-04 ENCOUNTER — Other Ambulatory Visit: Payer: Self-pay | Admitting: Family Medicine

## 2017-03-09 ENCOUNTER — Other Ambulatory Visit: Payer: Self-pay | Admitting: Family Medicine

## 2017-03-09 DIAGNOSIS — R42 Dizziness and giddiness: Secondary | ICD-10-CM

## 2017-03-09 NOTE — Telephone Encounter (Signed)
MEDICATION: Meclizine 25mg     PHARMACY:  [facility]  IS THIS A 90 DAY SUPPLY :   IS PATIENT OUT OF MEDICATION:   IF NOT; HOW MUCH IS LEFT:   LAST APPOINTMENT DATE: @11 /18/2018  NEXT APPOINTMENT DATE:@4 /07/2017  OTHER COMMENTS: looks like you have given several times in the past.    **Let patient know to contact pharmacy at the end of the day to make sure medication is ready. **  ** Please notify patient to allow 48-72 hours to process**  **Encourage patient to contact the pharmacy for refills or they can request refills through [facility]**

## 2017-03-18 ENCOUNTER — Ambulatory Visit: Payer: Medicare Other | Admitting: Obstetrics and Gynecology

## 2017-03-18 ENCOUNTER — Other Ambulatory Visit: Payer: Self-pay

## 2017-03-18 ENCOUNTER — Encounter: Payer: Self-pay | Admitting: Obstetrics and Gynecology

## 2017-03-18 VITALS — BP 120/80 | HR 70 | Resp 16 | Ht 60.0 in | Wt 143.0 lb

## 2017-03-18 DIAGNOSIS — N952 Postmenopausal atrophic vaginitis: Secondary | ICD-10-CM

## 2017-03-18 DIAGNOSIS — Z4689 Encounter for fitting and adjustment of other specified devices: Secondary | ICD-10-CM | POA: Diagnosis not present

## 2017-03-18 NOTE — Progress Notes (Signed)
. GYNECOLOGY  VISIT   HPI: 72 y.o.   Divorced  Caucasian  female   G2P2002 with No LMP recorded. Patient has had a hysterectomy.   here for 2week pessary check. Doing well, she is so much more comfortable with the pessary. At her last visit she was noted to have slight vaginal erythema. She has been leaving the pessary out more. Took it out Wednesday night and hasn't put it back in yet. Using her estrogen cream 2 x a week.   GYNECOLOGIC HISTORY: No LMP recorded. Patient has had a hysterectomy. Contraception:hysterectomy Menopausal hormone therapy: estradiol cream        OB History    Gravida Para Term Preterm AB Living   2 2 2     2    SAB TAB Ectopic Multiple Live Births           2         Patient Active Problem List   Diagnosis Date Noted  . Type 2 diabetes mellitus with hyperglycemia, without long-term current use of insulin ([place]) 06/18/2016  . History of right knee joint replacement 06/17/2016  . History of left hip replacement 06/17/2016  . History of vaginal hysterectomy 06/17/2016  . OA (osteoarthritis) of knee 11/03/2015  . Hyperlipidemia associated with type 2 diabetes mellitus ([place]) 07/23/2014  . Genu valgum 08/11/2012  . Overweight 02/25/2010  . Hypertension associated with diabetes ([place]) 02/25/2010    Past Medical History:  Diagnosis Date  . Anemia   . Cataract   . Diabetes mellitus without complication ([place])   . Gastric ulcer 11/17/2011  . History of total left hip replacement 2011  . HTN (hypertension)   . Hx of total knee replacement 2017   11-03-2015   . Hyperlipemia   . Hyperplastic polyp of intestine 11/17/2011  . Hypertension   . Measles   . Menopause   . Migraine   . Osteoarthritis     Past Surgical History:  Procedure Laterality Date  . ABDOMINAL HYSTERECTOMY    . CARPAL TUNNEL RELEASE Right   . CATARACT EXTRACTION Bilateral   . HIP ARTHROPLASTY Left 2011  . JOINT REPLACEMENT    . RETINAL DETACHMENT SURGERY    . TOTAL KNEE ARTHROPLASTY  Right 11/03/2015   Procedure: RIGHT TOTAL KNEE ARTHROPLASTY;  Surgeon: Gaynelle Arabian, MD;  Location: [facility];  Service: Orthopedics;  Laterality: Right;  . TOTAL VAGINAL HYSTERECTOMY  2012     Current Outpatient Medications  Medication Sig Dispense Refill  . ACCU-CHEK FASTCLIX LANCETS MISC Check blood sugars 1-2 times per day. 100 each 2  . ACCU-CHEK GUIDE test strip TEST FOUR TIMES DAILY. 100 each 6  . aspirin EC 81 MG tablet Take 81 mg by mouth daily.    . blood glucose meter kit and supplies Dispense based on patient and insurance preference. Use up to four times daily as directed. (FOR ICD-9 250.00, 250.01). 1 each 0  . fluticasone (FLONASE) 50 MCG/ACT nasal spray Place 1 spray into both nostrils 2 (two) times daily as needed for allergies or rhinitis. 16 g 6  . glipiZIDE (GLUCOTROL) 5 MG tablet Take 1 tablet (5 mg total) by mouth daily before breakfast. (Patient taking differently: Take 2.5 mg by mouth daily before breakfast. ) 90 tablet 3  . meclizine (ANTIVERT) 25 MG tablet TAKE 1 TABLET (25 MG TOTAL) BY MOUTH 3 (THREE) TIMES DAILY AS NEEDED FOR DIZZINESS. 30 tablet 0  . metFORMIN (GLUCOPHAGE) 1000 MG tablet Take 1 tablet (1,000 mg total)  by mouth 2 (two) times daily with a meal. 180 tablet 3  . NONFORMULARY OR COMPOUNDED ITEM Estradiol cream 0.02%, use 1/2 a gram vaginally qhs x 5 days, the 2 x a week at hs. 3 month supply sent, no refills 3 each 0  . pravastatin (PRAVACHOL) 40 MG tablet TAKE 1 TABLET BY MOUTH EVERY DAY 90 tablet 1   No current facility-administered medications for this visit.      ALLERGIES: Probiotic [acidophilus]  Family History  Problem Relation Age of Onset  . Diabetes Mother        deceased  . Heart attack Mother   . Diabetes Sister        x2  . Kidney cancer Father   . Heart attack Father   . Diabetes Sister     Social History   Socioeconomic History  . Marital status: Divorced    Spouse name: Not on file  . Number of children: Not on file  .  Years of education: Not on file  . Highest education level: Not on file  Social Needs  . Financial resource strain: Not on file  . Food insecurity - worry: Not on file  . Food insecurity - inability: Not on file  . Transportation needs - medical: Not on file  . Transportation needs - non-medical: Not on file  Occupational History  . Occupation: retired    Fish farm manager: RETIRED  Tobacco Use  . Smoking status: Never Smoker  . Smokeless tobacco: Never Used  Substance and Sexual Activity  . Alcohol use: No  . Drug use: No  . Sexual activity: No    Partners: Male    Birth control/protection: Post-menopausal, Surgical  Other Topics Concern  . Not on file  Social History Narrative   Divorced, 2 children; retired.     Review of Systems  HENT: Negative.        Sinusitis  Eyes: Negative.   Respiratory: Negative.   Cardiovascular: Negative.   Gastrointestinal: Negative.   Genitourinary:       Abnormal discharge, some bleeding  Musculoskeletal: Negative.   Skin: Negative.   Neurological: Negative.   Endo/Heme/Allergies: Negative.   Psychiatric/Behavioral: Negative.     PHYSICAL EXAMINATION:    BP 120/80   Pulse 70   Resp 16   Ht 5' (1.524 m)   Wt 143 lb (64.9 kg)   BMI 27.93 kg/m     General appearance: alert, cooperative and appears stated age  Pelvic: External genitalia:  no lesions              Urethra:  normal appearing urethra with no masses, tenderness or lesions              Bartholins and Skenes: normal                 Vagina: mildly atrophic appearing vagina with normal color. No discharge, no vaginal irritation  The pessary was replaced              Cervix:absent  Chaperone was present for exam.  ASSESSMENT Cystocele and vault prolapse s/p hysterectomy. Symptoms are controlled with the #5 ring pessary with support Vaginal atrophy, helped with the estrogen cream, prior area of vaginal erythema has resolved    PLAN Pessary replaced She will continue to use  the estrogen cream 2 x a week at hs. The nights she uses the cream she will take the pessary out overnight F/u in 1 month Call with any concerns  An After Visit Summary was printed and given to the patient.

## 2017-04-20 ENCOUNTER — Encounter: Payer: Self-pay | Admitting: Obstetrics and Gynecology

## 2017-04-20 ENCOUNTER — Ambulatory Visit: Payer: Medicare Other | Admitting: Obstetrics and Gynecology

## 2017-04-20 ENCOUNTER — Other Ambulatory Visit: Payer: Self-pay

## 2017-04-20 VITALS — BP 136/78 | HR 108 | Resp 14 | Wt 142.0 lb

## 2017-04-20 DIAGNOSIS — N898 Other specified noninflammatory disorders of vagina: Secondary | ICD-10-CM | POA: Diagnosis not present

## 2017-04-20 DIAGNOSIS — Z4689 Encounter for fitting and adjustment of other specified devices: Secondary | ICD-10-CM | POA: Diagnosis not present

## 2017-04-20 DIAGNOSIS — N899 Noninflammatory disorder of vagina, unspecified: Secondary | ICD-10-CM | POA: Diagnosis not present

## 2017-04-20 NOTE — Progress Notes (Signed)
GYNECOLOGY  VISIT   HPI: 72 y.o.   Divorced  Caucasian  female   G2P2002 with No LMP recorded. Patient has had a hysterectomy.   here for pessary check. Patient c/o increased vaginal discharge   She is s/p hysterectomy and has a cystocele and vault prolapse. Symptoms have been controlled with the #5 ring pessary with support. She is using estrogen cream 2 x a week and takes the pessary out overnight the nights she uses the estrogen.  She has noticed an increase in milky discharge in the last 3 weeks. No itching, burning, irritation or odor. No vaginal incontinence.  She has had a recent GI bug.  She is the guardian of 2 of her grandchildren.   GYNECOLOGIC HISTORY: No LMP recorded. Patient has had a hysterectomy. Contraception:postmenopause  Menopausal hormone therapy: none         OB History    Gravida Para Term Preterm AB Living   2 2 2     2    SAB TAB Ectopic Multiple Live Births           2         Patient Active Problem List   Diagnosis Date Noted  . Type 2 diabetes mellitus with hyperglycemia, without long-term current use of insulin ([place]) 06/18/2016  . History of right knee joint replacement 06/17/2016  . History of left hip replacement 06/17/2016  . History of vaginal hysterectomy 06/17/2016  . OA (osteoarthritis) of knee 11/03/2015  . Hyperlipidemia associated with type 2 diabetes mellitus ([place]) 07/23/2014  . Genu valgum 08/11/2012  . Overweight 02/25/2010  . Hypertension associated with diabetes ([place]) 02/25/2010    Past Medical History:  Diagnosis Date  . Anemia   . Cataract   . Diabetes mellitus without complication ([place])   . Gastric ulcer 11/17/2011  . History of total left hip replacement 2011  . HTN (hypertension)   . Hx of total knee replacement 2017   11-03-2015   . Hyperlipemia   . Hyperplastic polyp of intestine 11/17/2011  . Hypertension   . Measles   . Menopause   . Migraine   . Osteoarthritis     Past Surgical History:  Procedure Laterality  Date  . ABDOMINAL HYSTERECTOMY    . CARPAL TUNNEL RELEASE Right   . CATARACT EXTRACTION Bilateral   . HIP ARTHROPLASTY Left 2011  . JOINT REPLACEMENT    . RETINAL DETACHMENT SURGERY    . TOTAL KNEE ARTHROPLASTY Right 11/03/2015   Procedure: RIGHT TOTAL KNEE ARTHROPLASTY;  Surgeon: Gaynelle Arabian, MD;  Location: [facility];  Service: Orthopedics;  Laterality: Right;  . TOTAL VAGINAL HYSTERECTOMY  2012     Current Outpatient Medications  Medication Sig Dispense Refill  . ACCU-CHEK FASTCLIX LANCETS MISC Check blood sugars 1-2 times per day. 100 each 2  . ACCU-CHEK GUIDE test strip TEST FOUR TIMES DAILY. 100 each 6  . aspirin EC 81 MG tablet Take 81 mg by mouth daily.    . blood glucose meter kit and supplies Dispense based on patient and insurance preference. Use up to four times daily as directed. (FOR ICD-9 250.00, 250.01). 1 each 0  . fluticasone (FLONASE) 50 MCG/ACT nasal spray Place 1 spray into both nostrils 2 (two) times daily as needed for allergies or rhinitis. 16 g 6  . glipiZIDE (GLUCOTROL) 5 MG tablet Take 1 tablet (5 mg total) by mouth daily before breakfast. (Patient taking differently: Take 2.5 mg by mouth daily before breakfast. ) 90 tablet 3  .  meclizine (ANTIVERT) 25 MG tablet TAKE 1 TABLET (25 MG TOTAL) BY MOUTH 3 (THREE) TIMES DAILY AS NEEDED FOR DIZZINESS. 30 tablet 0  . metFORMIN (GLUCOPHAGE) 1000 MG tablet Take 1 tablet (1,000 mg total) by mouth 2 (two) times daily with a meal. 180 tablet 3  . NONFORMULARY OR COMPOUNDED ITEM Estradiol cream 0.02%, use 1/2 a gram vaginally qhs x 5 days, the 2 x a week at hs. 3 month supply sent, no refills 3 each 0  . pravastatin (PRAVACHOL) 40 MG tablet TAKE 1 TABLET BY MOUTH EVERY DAY 90 tablet 1   No current facility-administered medications for this visit.      ALLERGIES: Probiotic [acidophilus]  Family History  Problem Relation Age of Onset  . Diabetes Mother        deceased  . Heart attack Mother   . Diabetes Sister        x2   . Kidney cancer Father   . Heart attack Father   . Diabetes Sister     Social History   Socioeconomic History  . Marital status: Divorced    Spouse name: Not on file  . Number of children: Not on file  . Years of education: Not on file  . Highest education level: Not on file  Social Needs  . Financial resource strain: Not on file  . Food insecurity - worry: Not on file  . Food insecurity - inability: Not on file  . Transportation needs - medical: Not on file  . Transportation needs - non-medical: Not on file  Occupational History  . Occupation: retired    Fish farm manager: RETIRED  Tobacco Use  . Smoking status: Never Smoker  . Smokeless tobacco: Never Used  Substance and Sexual Activity  . Alcohol use: No  . Drug use: No  . Sexual activity: No    Partners: Male    Birth control/protection: Post-menopausal, Surgical  Other Topics Concern  . Not on file  Social History Narrative   Divorced, 2 children; retired.     Review of Systems  Constitutional: Negative.   HENT: Negative.   Eyes: Negative.   Respiratory: Negative.   Cardiovascular: Negative.   Gastrointestinal: Negative.   Genitourinary:       Vaginal discharge   Musculoskeletal: Negative.   Skin: Negative.   Neurological: Negative.   Endo/Heme/Allergies: Negative.   Psychiatric/Behavioral: Negative.     PHYSICAL EXAMINATION:    BP 136/78 (BP Location: Right Arm, Patient Position: Sitting, Cuff Size: Normal)   Pulse (!) 108   Resp 14   Wt 142 lb (64.4 kg)   BMI 27.73 kg/m     General appearance: alert, cooperative and appears stated age  Pelvic: External genitalia:  no lesions              Urethra:  normal appearing urethra with no masses, tenderness or lesions              Bartholins and Skenes: normal                 Vagina: the pessary was removed and cleaned. The vaginal apex was erythematous and friable, no granulation tissue. The friable region was treated with silver nitrate. The area of  irritation was approximately 3 x 2 cm. The pessary was left out. No abnormal d/c was noted              Cervix: absent Chaperone was present for exam.  ASSESSMENT Pessary maintenance, she has new vaginal irritation at  the vaginal cuff (this is with taking the pessary out 2 x a week) C/O vaginal discharge    PLAN Pessary left out Increase premarin cream to 0.5 grams qhs x 1 week F/U in one week Affirm sent   An After Visit Summary was printed and given to the patient.

## 2017-04-21 ENCOUNTER — Telehealth: Payer: Self-pay | Admitting: *Deleted

## 2017-04-21 LAB — VAGINITIS/VAGINOSIS, DNA PROBE
CANDIDA SPECIES: NEGATIVE
Gardnerella vaginalis: NEGATIVE
Trichomonas vaginosis: NEGATIVE

## 2017-04-21 NOTE — Telephone Encounter (Signed)
-----   Message from Salvadore Dom, MD sent at 04/21/2017  1:46 PM EST ----- Please advise the patient of normal results.

## 2017-04-21 NOTE — Telephone Encounter (Signed)
Spoke with Dr. Talbert Nan- Patient needs Evaluation. Called patient and scheduled at 8:45 04/22/17 -eh

## 2017-04-21 NOTE — Telephone Encounter (Signed)
Called patient with results. Patient states she has been bleeding since having the pessary taken out yesterday. She has been wearing a pad since yesterday. She has changed her pad twice today. -eh

## 2017-04-22 ENCOUNTER — Encounter: Payer: Self-pay | Admitting: Obstetrics and Gynecology

## 2017-04-22 ENCOUNTER — Ambulatory Visit: Payer: Medicare Other | Admitting: Obstetrics and Gynecology

## 2017-04-22 ENCOUNTER — Other Ambulatory Visit: Payer: Self-pay

## 2017-04-22 VITALS — BP 122/70 | HR 84 | Resp 16 | Wt 142.0 lb

## 2017-04-22 DIAGNOSIS — N898 Other specified noninflammatory disorders of vagina: Secondary | ICD-10-CM

## 2017-04-22 DIAGNOSIS — N8111 Cystocele, midline: Secondary | ICD-10-CM | POA: Diagnosis not present

## 2017-04-22 DIAGNOSIS — N819 Female genital prolapse, unspecified: Secondary | ICD-10-CM

## 2017-04-22 NOTE — Progress Notes (Signed)
GYNECOLOGY  VISIT   HPI: 72 y.o.   Divorced  Caucasian  female   G2P2002 with No LMP recorded. Patient has had a hysterectomy.   here for vaginal bleeding. The patient was seen on 2/6 for a pessary check, she was noted to have vaginal irritation and the pessary was left out. She started having bleeding after she left and it picked up yesterday. She was needing a pad, not filling it. This morning it is a little better, but she has been in bed all night. She was having some increase in back pain yesterday.   GYNECOLOGIC HISTORY: No LMP recorded. Patient has had a hysterectomy. Contraception:NA Menopausal hormone therapy: NA        OB History    Gravida Para Term Preterm AB Living   2 2 2     2    SAB TAB Ectopic Multiple Live Births           2         Patient Active Problem List   Diagnosis Date Noted  . Type 2 diabetes mellitus with hyperglycemia, without long-term current use of insulin ([place]) 06/18/2016  . History of right knee joint replacement 06/17/2016  . History of left hip replacement 06/17/2016  . History of vaginal hysterectomy 06/17/2016  . OA (osteoarthritis) of knee 11/03/2015  . Hyperlipidemia associated with type 2 diabetes mellitus ([place]) 07/23/2014  . Genu valgum 08/11/2012  . Overweight 02/25/2010  . Hypertension associated with diabetes ([place]) 02/25/2010    Past Medical History:  Diagnosis Date  . Anemia   . Cataract   . Diabetes mellitus without complication ([place])   . Gastric ulcer 11/17/2011  . History of total left hip replacement 2011  . HTN (hypertension)   . Hx of total knee replacement 2017   11-03-2015   . Hyperlipemia   . Hyperplastic polyp of intestine 11/17/2011  . Hypertension   . Measles   . Menopause   . Migraine   . Osteoarthritis     Past Surgical History:  Procedure Laterality Date  . ABDOMINAL HYSTERECTOMY    . CARPAL TUNNEL RELEASE Right   . CATARACT EXTRACTION Bilateral   . HIP ARTHROPLASTY Left 2011  . JOINT REPLACEMENT     . RETINAL DETACHMENT SURGERY    . TOTAL KNEE ARTHROPLASTY Right 11/03/2015   Procedure: RIGHT TOTAL KNEE ARTHROPLASTY;  Surgeon: Gaynelle Arabian, MD;  Location: [facility];  Service: Orthopedics;  Laterality: Right;  . TOTAL VAGINAL HYSTERECTOMY  2012     Current Outpatient Medications  Medication Sig Dispense Refill  . ACCU-CHEK FASTCLIX LANCETS MISC Check blood sugars 1-2 times per day. 100 each 2  . ACCU-CHEK GUIDE test strip TEST FOUR TIMES DAILY. 100 each 6  . aspirin EC 81 MG tablet Take 81 mg by mouth daily.    . blood glucose meter kit and supplies Dispense based on patient and insurance preference. Use up to four times daily as directed. (FOR ICD-9 250.00, 250.01). 1 each 0  . fluticasone (FLONASE) 50 MCG/ACT nasal spray Place 1 spray into both nostrils 2 (two) times daily as needed for allergies or rhinitis. 16 g 6  . glipiZIDE (GLUCOTROL) 5 MG tablet Take 1 tablet (5 mg total) by mouth daily before breakfast. (Patient taking differently: Take 2.5 mg by mouth daily before breakfast. ) 90 tablet 3  . meclizine (ANTIVERT) 25 MG tablet TAKE 1 TABLET (25 MG TOTAL) BY MOUTH 3 (THREE) TIMES DAILY AS NEEDED FOR DIZZINESS. 30 tablet 0  .  metFORMIN (GLUCOPHAGE) 1000 MG tablet Take 1 tablet (1,000 mg total) by mouth 2 (two) times daily with a meal. 180 tablet 3  . NONFORMULARY OR COMPOUNDED ITEM Estradiol cream 0.02%, use 1/2 a gram vaginally qhs x 5 days, the 2 x a week at hs. 3 month supply sent, no refills 3 each 0  . pravastatin (PRAVACHOL) 40 MG tablet TAKE 1 TABLET BY MOUTH EVERY DAY 90 tablet 1   No current facility-administered medications for this visit.      ALLERGIES: Probiotic [acidophilus]  Family History  Problem Relation Age of Onset  . Diabetes Mother        deceased  . Heart attack Mother   . Diabetes Sister        x2  . Kidney cancer Father   . Heart attack Father   . Diabetes Sister     Social History   Socioeconomic History  . Marital status: Divorced    Spouse  name: Not on file  . Number of children: Not on file  . Years of education: Not on file  . Highest education level: Not on file  Social Needs  . Financial resource strain: Not on file  . Food insecurity - worry: Not on file  . Food insecurity - inability: Not on file  . Transportation needs - medical: Not on file  . Transportation needs - non-medical: Not on file  Occupational History  . Occupation: retired    Fish farm manager: RETIRED  Tobacco Use  . Smoking status: Never Smoker  . Smokeless tobacco: Never Used  Substance and Sexual Activity  . Alcohol use: No  . Drug use: No  . Sexual activity: No    Partners: Male    Birth control/protection: Post-menopausal, Surgical  Other Topics Concern  . Not on file  Social History Narrative   Divorced, 2 children; retired.     Review of Systems  Constitutional: Negative.   HENT: Negative.   Eyes: Negative.   Respiratory: Negative.   Cardiovascular: Negative.   Gastrointestinal: Negative.   Genitourinary:       Vaginal bleeding    Musculoskeletal: Negative.   Skin: Negative.   Neurological: Negative.   Endo/Heme/Allergies: Negative.   Psychiatric/Behavioral: Negative.     PHYSICAL EXAMINATION:    BP 122/70 (BP Location: Right Arm, Patient Position: Sitting, Cuff Size: Normal)   Pulse 84   Resp 16   Wt 142 lb (64.4 kg)   BMI 27.73 kg/m     General appearance: alert, cooperative and appears stated age  Pelvic: External genitalia:  no lesions              Urethra:  normal appearing urethra with no masses, tenderness or lesions              Bartholins and Skenes: normal                 Vagina: the area of irritation at the vaginal apex has actually improved, only a one cm area of irritation currently. Not actively bleeding              Cervix:absent  Chaperone was present for exam.  ASSESSMENT Grade 3 cystocele, grade one vault prolapse. Well controlled with the pessary, but developed some vaginal irritation from the  pessary. She had some vaginal bleeding with the pessary out. The area is actually healing.     PLAN She will use the estrogen cream every night (hasn't since her last visit) She will use the  pessary intermittently over the weekend (Grandsons birthday) F/U next week   An After Visit Summary was printed and given to the patient.

## 2017-04-28 ENCOUNTER — Encounter: Payer: Self-pay | Admitting: Obstetrics and Gynecology

## 2017-04-28 ENCOUNTER — Other Ambulatory Visit: Payer: Self-pay

## 2017-04-28 ENCOUNTER — Ambulatory Visit: Payer: Medicare Other | Admitting: Obstetrics and Gynecology

## 2017-04-28 VITALS — BP 128/76 | HR 84 | Resp 14 | Wt 141.0 lb

## 2017-04-28 DIAGNOSIS — N899 Noninflammatory disorder of vagina, unspecified: Secondary | ICD-10-CM | POA: Diagnosis not present

## 2017-04-28 DIAGNOSIS — Z4689 Encounter for fitting and adjustment of other specified devices: Secondary | ICD-10-CM

## 2017-04-28 DIAGNOSIS — N898 Other specified noninflammatory disorders of vagina: Secondary | ICD-10-CM

## 2017-04-28 NOTE — Progress Notes (Signed)
GYNECOLOGY  VISIT   HPI: 72 y.o.   Divorced  Caucasian  female   G2P2001 with No LMP recorded. Patient has had a hysterectomy.   here for pessary follow up, she had some vaginal irritation from her pessary. The pessary was out for a few days, then she started using it only intermittently (only one time). Using nightly estrogen cream. The bleeding stopped, still with some d/c (may be the cream)   She is the guardian for her 27 year old grandson and 64 year old granddaughter (their dad, her son died last year, issues with drugs and diabetes). The kids mother has issues with drugs and has been in and out of jail. Her other son and is female partner live with her and the kids and help her.    GYNECOLOGIC HISTORY: No LMP recorded. Patient has had a hysterectomy. Contraception:postmenopause Menopausal hormone therapy: none         OB History    Gravida Para Term Preterm AB Living   _0 SAB TAB Ectopic Multiple Live Births           2         Patient Active Problem List   Diagnosis Date Noted  . Type 2 diabetes mellitus with hyperglycemia, without long-term current use of insulin ([place]) 06/18/2016  . History of right knee joint replacement 06/17/2016  . History of left hip replacement 06/17/2016  . History of vaginal hysterectomy 06/17/2016  . OA (osteoarthritis) of knee 11/03/2015  . Hyperlipidemia associated with type 2 diabetes mellitus ([place]) 07/23/2014  . Genu valgum 08/11/2012  . Overweight 02/25/2010  . Hypertension associated with diabetes ([place]) 02/25/2010    Past Medical History:  Diagnosis Date  . Anemia   . Cataract   . Diabetes mellitus without complication ([place])   . Gastric ulcer 11/17/2011  . History of total left hip replacement 2011  . HTN (hypertension)   . Hx of total knee replacement 2017   11-03-2015   . Hyperlipemia   . Hyperplastic polyp of intestine 11/17/2011  . Hypertension   . Measles   . Menopause   . Migraine   . Osteoarthritis     Past  Surgical History:  Procedure Laterality Date  . ABDOMINAL HYSTERECTOMY    . CARPAL TUNNEL RELEASE Right   . CATARACT EXTRACTION Bilateral   . HIP ARTHROPLASTY Left 2011  . JOINT REPLACEMENT    . RETINAL DETACHMENT SURGERY    . TOTAL KNEE ARTHROPLASTY Right 11/03/2015   Procedure: RIGHT TOTAL KNEE ARTHROPLASTY;  Surgeon: Gaynelle Arabian, MD;  Location: [facility];  Service: Orthopedics;  Laterality: Right;  . TOTAL VAGINAL HYSTERECTOMY  2012     Current Outpatient Medications  Medication Sig Dispense Refill  . ACCU-CHEK FASTCLIX LANCETS MISC Check blood sugars 1-2 times per day. 100 each 2  . ACCU-CHEK GUIDE test strip TEST FOUR TIMES DAILY. 100 each 6  . aspirin EC 81 MG tablet Take 81 mg by mouth daily.    . blood glucose meter kit and supplies Dispense based on patient and insurance preference. Use up to four times daily as directed. (FOR ICD-9 250.00, 250.01). 1 each 0  . fluticasone (FLONASE) 50 MCG/ACT nasal spray Place 1 spray into both nostrils 2 (two) times daily as needed for allergies or rhinitis. 16 g 6  . glipiZIDE (GLUCOTROL) 5 MG tablet Take 1 tablet (5 mg total) by mouth daily before breakfast. (Patient taking differently: Take  2.5 mg by mouth daily before breakfast. ) 90 tablet 3  . meclizine (ANTIVERT) 25 MG tablet TAKE 1 TABLET (25 MG TOTAL) BY MOUTH 3 (THREE) TIMES DAILY AS NEEDED FOR DIZZINESS. 30 tablet 0  . metFORMIN (GLUCOPHAGE) 1000 MG tablet Take 1 tablet (1,000 mg total) by mouth 2 (two) times daily with a meal. 180 tablet 3  . NONFORMULARY OR COMPOUNDED ITEM Estradiol cream 0.02%, use 1/2 a gram vaginally qhs x 5 days, the 2 x a week at hs. 3 month supply sent, no refills 3 each 0  . pravastatin (PRAVACHOL) 40 MG tablet TAKE 1 TABLET BY MOUTH EVERY DAY 90 tablet 1   No current facility-administered medications for this visit.      ALLERGIES: Probiotic [acidophilus]  Family History  Problem Relation Age of Onset  . Diabetes Mother        deceased  . Heart attack  Mother   . Diabetes Sister        x2  . Kidney cancer Father   . Heart attack Father   . Diabetes Sister     Social History   Socioeconomic History  . Marital status: Divorced    Spouse name: Not on file  . Number of children: Not on file  . Years of education: Not on file  . Highest education level: Not on file  Social Needs  . Financial resource strain: Not on file  . Food insecurity - worry: Not on file  . Food insecurity - inability: Not on file  . Transportation needs - medical: Not on file  . Transportation needs - non-medical: Not on file  Occupational History  . Occupation: retired    Fish farm manager: RETIRED  Tobacco Use  . Smoking status: Never Smoker  . Smokeless tobacco: Never Used  Substance and Sexual Activity  . Alcohol use: No  . Drug use: No  . Sexual activity: No    Partners: Male    Birth control/protection: Post-menopausal, Surgical  Other Topics Concern  . Not on file  Social History Narrative   Divorced, 2 children; retired.     Review of Systems  Constitutional: Negative.   HENT: Negative for hearing loss.        Colds, headache, sinusitis, nasal drainage   Eyes: Negative.   Respiratory: Negative.   Cardiovascular: Negative.   Gastrointestinal: Negative.   Genitourinary: Negative.   Musculoskeletal: Negative.   Skin: Negative.   Neurological: Negative.   Endo/Heme/Allergies: Negative.   Psychiatric/Behavioral: Negative.     PHYSICAL EXAMINATION:    BP 128/76 (BP Location: Right Arm, Patient Position: Sitting, Cuff Size: Normal)   Pulse 84   Resp 14   Wt 141 lb (64 kg)   BMI 27.54 kg/m     General appearance: alert, cooperative and appears stated age  Pelvic: External genitalia:  no lesions              Urethra:  normal appearing urethra with no masses, tenderness or lesions              Bartholins and Skenes: normal                 Vagina: prior area of irritation at the apex has healed, now with only minimal erythema. Pessary  placed              Cervix: absent  Chaperone was present for exam.  ASSESSMENT Vaginal irritation from the pessary has healed. Pessary replaced    PLAN She will  take the pessary out 3 x a week and use the estrogen cream those 3 nights.  F/U in 1.5 weeks.    An After Visit Summary was printed and given to the patient.

## 2017-05-06 ENCOUNTER — Ambulatory Visit: Payer: Medicare Other | Admitting: Obstetrics and Gynecology

## 2017-05-11 ENCOUNTER — Encounter: Payer: Self-pay | Admitting: Obstetrics and Gynecology

## 2017-05-11 ENCOUNTER — Ambulatory Visit: Payer: Medicare Other | Admitting: Obstetrics and Gynecology

## 2017-05-11 ENCOUNTER — Other Ambulatory Visit: Payer: Self-pay

## 2017-05-11 VITALS — BP 122/80 | HR 84 | Resp 14 | Wt 145.0 lb

## 2017-05-11 DIAGNOSIS — Z4689 Encounter for fitting and adjustment of other specified devices: Secondary | ICD-10-CM

## 2017-05-11 DIAGNOSIS — N899 Noninflammatory disorder of vagina, unspecified: Secondary | ICD-10-CM | POA: Diagnosis not present

## 2017-05-11 DIAGNOSIS — N3941 Urge incontinence: Secondary | ICD-10-CM

## 2017-05-11 DIAGNOSIS — N8111 Cystocele, midline: Secondary | ICD-10-CM

## 2017-05-11 DIAGNOSIS — N898 Other specified noninflammatory disorders of vagina: Secondary | ICD-10-CM

## 2017-05-11 LAB — POCT URINALYSIS DIPSTICK
Bilirubin, UA: NEGATIVE
Blood, UA: NEGATIVE
Glucose, UA: NEGATIVE
Ketones, UA: NEGATIVE
LEUKOCYTES UA: NEGATIVE
NITRITE UA: NEGATIVE
Protein, UA: NEGATIVE
pH, UA: 6 (ref 5.0–8.0)

## 2017-05-11 NOTE — Progress Notes (Signed)
GYNECOLOGY  VISIT   HPI: 72 y.o.   Divorced  Caucasian  female   G2P2001 with No LMP recorded. Patient has had a hysterectomy.   here for pessary check. She had developed some irritation from her pessary (#5 ring pessary with support). It was replaced on 2/14 after a break. She is taking the pessary out 3 x a week overnight and using estrogen cream on those days. She took it out 2 days ago and hasn't put it back in. No problems. No vaginal bleeding or discharge.  She was bulging around her prior pessary.  She does c/o urge incontinence, intermittent, occurs with a full bladder. Worse in the last year. She doesn't drink caffeine.    GYNECOLOGIC HISTORY: No LMP recorded. Patient has had a hysterectomy. Contraception:postmenopause Menopausal hormone therapy: none         OB History    Gravida Para Term Preterm AB Living   _0 SAB TAB Ectopic Multiple Live Births           2         Patient Active Problem List   Diagnosis Date Noted  . Type 2 diabetes mellitus with hyperglycemia, without long-term current use of insulin ([place]) 06/18/2016  . History of right knee joint replacement 06/17/2016  . History of left hip replacement 06/17/2016  . History of vaginal hysterectomy 06/17/2016  . OA (osteoarthritis) of knee 11/03/2015  . Hyperlipidemia associated with type 2 diabetes mellitus ([place]) 07/23/2014  . Genu valgum 08/11/2012  . Overweight 02/25/2010  . Hypertension associated with diabetes ([place]) 02/25/2010    Past Medical History:  Diagnosis Date  . Anemia   . Cataract   . Diabetes mellitus without complication ([place])   . Gastric ulcer 11/17/2011  . History of total left hip replacement 2011  . HTN (hypertension)   . Hx of total knee replacement 2017   11-03-2015   . Hyperlipemia   . Hyperplastic polyp of intestine 11/17/2011  . Hypertension   . Measles   . Menopause   . Migraine   . Osteoarthritis     Past Surgical History:  Procedure Laterality Date  .  ABDOMINAL HYSTERECTOMY    . CARPAL TUNNEL RELEASE Right   . CATARACT EXTRACTION Bilateral   . HIP ARTHROPLASTY Left 2011  . JOINT REPLACEMENT    . RETINAL DETACHMENT SURGERY    . TOTAL KNEE ARTHROPLASTY Right 11/03/2015   Procedure: RIGHT TOTAL KNEE ARTHROPLASTY;  Surgeon: Gaynelle Arabian, MD;  Location: [facility];  Service: Orthopedics;  Laterality: Right;  . TOTAL VAGINAL HYSTERECTOMY  2012     Current Outpatient Medications  Medication Sig Dispense Refill  . ACCU-CHEK FASTCLIX LANCETS MISC Check blood sugars 1-2 times per day. 100 each 2  . ACCU-CHEK GUIDE test strip TEST FOUR TIMES DAILY. 100 each 6  . aspirin EC 81 MG tablet Take 81 mg by mouth daily.    . blood glucose meter kit and supplies Dispense based on patient and insurance preference. Use up to four times daily as directed. (FOR ICD-9 250.00, 250.01). 1 each 0  . fluticasone (FLONASE) 50 MCG/ACT nasal spray Place 1 spray into both nostrils 2 (two) times daily as needed for allergies or rhinitis. 16 g 6  . glipiZIDE (GLUCOTROL) 5 MG tablet Take 1 tablet (5 mg total) by mouth daily before breakfast. (Patient taking differently: Take 2.5 mg by mouth daily before breakfast. ) 90 tablet 3  . meclizine (  ANTIVERT) 25 MG tablet TAKE 1 TABLET (25 MG TOTAL) BY MOUTH 3 (THREE) TIMES DAILY AS NEEDED FOR DIZZINESS. 30 tablet 0  . metFORMIN (GLUCOPHAGE) 1000 MG tablet Take 1 tablet (1,000 mg total) by mouth 2 (two) times daily with a meal. 180 tablet 3  . NONFORMULARY OR COMPOUNDED ITEM Estradiol cream 0.02%, use 1/2 a gram vaginally qhs x 5 days, the 2 x a week at hs. 3 month supply sent, no refills 3 each 0  . pravastatin (PRAVACHOL) 40 MG tablet TAKE 1 TABLET BY MOUTH EVERY DAY 90 tablet 1   No current facility-administered medications for this visit.      ALLERGIES: Probiotic [acidophilus]  Family History  Problem Relation Age of Onset  . Diabetes Mother        deceased  . Heart attack Mother   . Diabetes Sister        x2  . Kidney  cancer Father   . Heart attack Father   . Diabetes Sister     Social History   Socioeconomic History  . Marital status: Divorced    Spouse name: Not on file  . Number of children: Not on file  . Years of education: Not on file  . Highest education level: Not on file  Social Needs  . Financial resource strain: Not on file  . Food insecurity - worry: Not on file  . Food insecurity - inability: Not on file  . Transportation needs - medical: Not on file  . Transportation needs - non-medical: Not on file  Occupational History  . Occupation: retired    Fish farm manager: RETIRED  Tobacco Use  . Smoking status: Never Smoker  . Smokeless tobacco: Never Used  Substance and Sexual Activity  . Alcohol use: No  . Drug use: No  . Sexual activity: No    Partners: Male    Birth control/protection: Post-menopausal, Surgical  Other Topics Concern  . Not on file  Social History Narrative   Divorced, 2 children; retired.     Review of Systems  Constitutional: Negative.   HENT: Positive for sinus pain.   Eyes: Negative.   Respiratory: Negative.   Cardiovascular: Negative.   Gastrointestinal: Negative.        Bloating  Genitourinary: Negative.   Musculoskeletal: Negative.   Skin: Negative.   Neurological: Negative.   Endo/Heme/Allergies: Negative.   Psychiatric/Behavioral: Negative.     PHYSICAL EXAMINATION:    BP 122/80 (BP Location: Right Arm, Patient Position: Sitting, Cuff Size: Normal)   Pulse 84   Resp 14   Wt 145 lb (65.8 kg)   BMI 28.32 kg/m     General appearance: alert, cooperative and appears stated age   Pelvic: External genitalia:  no lesions              Urethra:  normal appearing urethra with no masses, tenderness or lesions              Bartholins and Skenes: normal                 Vagina: slight vaginal irritation at the apex. Grade 3 cystocele, no significant vault prolapse noted today (previously noted grade 1)  Fitted with a #6 ring pessary with support, not  comfortable, tried a #2 cube (very uncomfortable with insertion and prolapsed around it), fitted with a #5 incontinence dish, too big, fitted with a #4 incontinence dish, also uncomfortable              Cervix:  absent  Chaperone was present for exam.  ASSESSMENT Midline cystocele, helped with the #5 ring pessary with support, but repeated vaginal irritation. Unable to find a comfortable cube, incontinence dish, uncomfortable with the bigger ring pessary Urge incontinence    PLAN She will try to leave the pessary out more, declines trying a [place] Use premarin cream 0.5 gram 3 x a week F/U in 2 weeks If she isn't better, she would like to consider surgery Send urine for ua, c&s    An After Visit Summary was printed and given to the patient.  Over 30 minutes face to face time of which over 50% was spent in counseling.

## 2017-05-12 LAB — URINE CULTURE: ORGANISM ID, BACTERIA: NO GROWTH

## 2017-05-12 LAB — URINALYSIS, MICROSCOPIC ONLY
Casts: NONE SEEN /lpf
RBC MICROSCOPIC, UA: NONE SEEN /HPF (ref 0–?)

## 2017-05-25 ENCOUNTER — Encounter: Payer: Self-pay | Admitting: Obstetrics and Gynecology

## 2017-05-25 ENCOUNTER — Other Ambulatory Visit: Payer: Self-pay

## 2017-05-25 ENCOUNTER — Ambulatory Visit (INDEPENDENT_AMBULATORY_CARE_PROVIDER_SITE_OTHER): Payer: Medicare Other | Admitting: Obstetrics and Gynecology

## 2017-05-25 VITALS — BP 142/70 | HR 82 | Resp 14 | Wt 143.4 lb

## 2017-05-25 DIAGNOSIS — Z4689 Encounter for fitting and adjustment of other specified devices: Secondary | ICD-10-CM | POA: Diagnosis not present

## 2017-05-25 DIAGNOSIS — N899 Noninflammatory disorder of vagina, unspecified: Secondary | ICD-10-CM | POA: Diagnosis not present

## 2017-05-25 DIAGNOSIS — N898 Other specified noninflammatory disorders of vagina: Secondary | ICD-10-CM

## 2017-05-25 NOTE — Progress Notes (Signed)
GYNECOLOGY  VISIT   HPI: 72 y.o.   Divorced  Caucasian  female   G2P2001 with No LMP recorded. Patient has had a hysterectomy.   here for   2 week pessary check. She has good support with the #5 ring with support, but has repeated vaginal irritation. Unable to find a comfortable cube, incontinence dish or other ring pessary. She declines using the gellhorn. She is trying to leave the pessary out more, using the premarin cream 3 x a week.  She is using the pessary when she is going to be out for a while, when she is home she tries to leave the pessary out.  She had a TVH about 10 years ago at [facility] hospital. She says they tacked her bladder up at the same time. She noticed her bladder prolapsing about 2 weeks after surgery.   GYNECOLOGIC HISTORY: No LMP recorded. Patient has had a hysterectomy. Contraception: Hysterectomy  Menopausal hormone therapy: none         OB History    Gravida Para Term Preterm AB Living   2 2 2     1    SAB TAB Ectopic Multiple Live Births           2         Patient Active Problem List   Diagnosis Date Noted  . Type 2 diabetes mellitus with hyperglycemia, without long-term current use of insulin ([place]) 06/18/2016  . History of right knee joint replacement 06/17/2016  . History of left hip replacement 06/17/2016  . History of vaginal hysterectomy 06/17/2016  . OA (osteoarthritis) of knee 11/03/2015  . Hyperlipidemia associated with type 2 diabetes mellitus ([place]) 07/23/2014  . Genu valgum 08/11/2012  . Overweight 02/25/2010  . Hypertension associated with diabetes ([place]) 02/25/2010    Past Medical History:  Diagnosis Date  . Anemia   . Cataract   . Diabetes mellitus without complication ([place])   . Gastric ulcer 11/17/2011  . History of total left hip replacement 2011  . HTN (hypertension)   . Hx of total knee replacement 2017   11-03-2015   . Hyperlipemia   . Hyperplastic polyp of intestine 11/17/2011  . Hypertension   . Measles   . Menopause    . Migraine   . Osteoarthritis     Past Surgical History:  Procedure Laterality Date  . CARPAL TUNNEL RELEASE Right   . CATARACT EXTRACTION Bilateral   . HIP ARTHROPLASTY Left 2011  . JOINT REPLACEMENT    . RETINAL DETACHMENT SURGERY    . TOTAL KNEE ARTHROPLASTY Right 11/03/2015   Procedure: RIGHT TOTAL KNEE ARTHROPLASTY;  Surgeon: Gaynelle Arabian, MD;  Location: [facility];  Service: Orthopedics;  Laterality: Right;  . TOTAL VAGINAL HYSTERECTOMY  2012     Current Outpatient Medications  Medication Sig Dispense Refill  . ACCU-CHEK FASTCLIX LANCETS MISC Check blood sugars 1-2 times per day. 100 each 2  . ACCU-CHEK GUIDE test strip TEST FOUR TIMES DAILY. 100 each 6  . aspirin EC 81 MG tablet Take 81 mg by mouth daily.    . blood glucose meter kit and supplies Dispense based on patient and insurance preference. Use up to four times daily as directed. (FOR ICD-9 250.00, 250.01). 1 each 0  . fluticasone (FLONASE) 50 MCG/ACT nasal spray Place 1 spray into both nostrils 2 (two) times daily as needed for allergies or rhinitis. 16 g 6  . glipiZIDE (GLUCOTROL) 5 MG tablet Take 1 tablet (5 mg total) by mouth daily  before breakfast. (Patient taking differently: Take 2.5 mg by mouth daily before breakfast. ) 90 tablet 3  . meclizine (ANTIVERT) 25 MG tablet TAKE 1 TABLET (25 MG TOTAL) BY MOUTH 3 (THREE) TIMES DAILY AS NEEDED FOR DIZZINESS. 30 tablet 0  . metFORMIN (GLUCOPHAGE) 1000 MG tablet Take 1 tablet (1,000 mg total) by mouth 2 (two) times daily with a meal. 180 tablet 3  . NONFORMULARY OR COMPOUNDED ITEM Estradiol cream 0.02%, use 1/2 a gram vaginally qhs x 5 days, the 2 x a week at hs. 3 month supply sent, no refills 3 each 0  . pravastatin (PRAVACHOL) 40 MG tablet TAKE 1 TABLET BY MOUTH EVERY DAY 90 tablet 1   No current facility-administered medications for this visit.      ALLERGIES: Probiotic [acidophilus]  Family History  Problem Relation Age of Onset  . Diabetes Mother        deceased   . Heart attack Mother   . Diabetes Sister        x2  . Kidney cancer Father   . Heart attack Father   . Diabetes Sister     Social History   Socioeconomic History  . Marital status: Divorced    Spouse name: Not on file  . Number of children: Not on file  . Years of education: Not on file  . Highest education level: Not on file  Social Needs  . Financial resource strain: Not on file  . Food insecurity - worry: Not on file  . Food insecurity - inability: Not on file  . Transportation needs - medical: Not on file  . Transportation needs - non-medical: Not on file  Occupational History  . Occupation: retired    Fish farm manager: RETIRED  Tobacco Use  . Smoking status: Never Smoker  . Smokeless tobacco: Never Used  Substance and Sexual Activity  . Alcohol use: No  . Drug use: No  . Sexual activity: No    Partners: Male    Birth control/protection: Post-menopausal, Surgical  Other Topics Concern  . Not on file  Social History Narrative   Divorced, 2 children; retired.     Review of Systems  Constitutional: Negative.   HENT: Negative.   Eyes: Negative.   Respiratory: Negative.   Cardiovascular: Negative.   Gastrointestinal: Negative.   Genitourinary: Negative.   Musculoskeletal: Negative.   Skin: Negative.   Neurological: Negative.   Endo/Heme/Allergies: Negative.   Psychiatric/Behavioral: Negative.     PHYSICAL EXAMINATION:    BP (!) 142/70 (BP Location: Right Arm, Patient Position: Sitting, Cuff Size: Normal)   Pulse 82   Resp 14   Wt 143 lb 6.4 oz (65 kg)   BMI 28.01 kg/m     General appearance: alert, cooperative and appears stated age  Pelvic: External genitalia:  no lesions              Urethra:  normal appearing urethra with no masses, tenderness or lesions              Bartholins and Skenes: normal                 Vagina: Mild vaginal irritation at the vaginal apex, slightly friable, 1-2 cm area. Superficially irritated. Pessary replaced (it was out when  she got here).  Chaperone was present for exam.  ASSESSMENT Genital prolapse, grade 3 cystocele. Symptoms controlled with the pessary, but she has recurrent vaginal irritation, currently mild    PLAN She will continue to use the pessary  intermittently, will try using a lubricant when she puts the pessary in Use the premarin cream 3 x a week F/U in 2 weeks Will get a copy of her op note if possible   An After Visit Summary was printed and given to the patient.

## 2017-05-26 ENCOUNTER — Other Ambulatory Visit: Payer: Self-pay | Admitting: Family Medicine

## 2017-05-26 DIAGNOSIS — R42 Dizziness and giddiness: Secondary | ICD-10-CM

## 2017-05-26 NOTE — Telephone Encounter (Signed)
Copied from [place] [id number]. Topic: Quick Communication - Rx Refill/Question >> May 26, 2017  3:26 PM Cleaster Corin, [place] wrote: Medication:   meclizine (ANTIVERT) 25 MG tablet [[id number]]   Has the patient contacted their pharmacy? yes   (Agent: If no, request that the patient contact the pharmacy for the refill.)   Preferred Pharmacy (with phone number or street name): [facility] [place] - [address] [place] [address] Phone: [phone] Fax: [phone]     Agent: Please be advised that RX refills may take up to 3 business days. We ask that you follow-up with your pharmacy.

## 2017-05-27 MED ORDER — MECLIZINE HCL 25 MG PO TABS: 25.0000 mg | ORAL_TABLET | Freq: Three times a day (TID) | ORAL | 0 refills | Status: DC | PRN

## 2017-05-27 NOTE — Telephone Encounter (Signed)
OK to approve. 

## 2017-05-27 NOTE — Telephone Encounter (Signed)
Refill of Antivert  LOV 12/17/16  Dr. Juleen China  [facility] [phone] [address]

## 2017-06-08 ENCOUNTER — Ambulatory Visit (INDEPENDENT_AMBULATORY_CARE_PROVIDER_SITE_OTHER): Payer: Medicare Other | Admitting: Obstetrics and Gynecology

## 2017-06-08 ENCOUNTER — Encounter: Payer: Self-pay | Admitting: Obstetrics and Gynecology

## 2017-06-08 ENCOUNTER — Other Ambulatory Visit: Payer: Self-pay

## 2017-06-08 VITALS — BP 124/78 | HR 84 | Resp 14 | Wt 142.0 lb

## 2017-06-08 DIAGNOSIS — Z4689 Encounter for fitting and adjustment of other specified devices: Secondary | ICD-10-CM | POA: Diagnosis not present

## 2017-06-08 NOTE — Progress Notes (Addendum)
GYNECOLOGY  VISIT   HPI: 72 y.o.   Divorced  Caucasian  female   G2P2001 with No LMP recorded. Patient has had a hysterectomy.   here for follow up pessary. She is just using the pessary prn. She has used it about 3 different days since her last visit.  No bleeding, no significant d/c. Using lubricant with placing the pessary. She is using compounded estrogen 3 x a week.    GYNECOLOGIC HISTORY: No LMP recorded. Patient has had a hysterectomy. Contraception: hysterectomy  Menopausal hormone therapy: none         OB History    Gravida  2   Para  2   Term  2   Preterm      AB      Living  1     SAB      TAB      Ectopic      Multiple      Live Births  2              Patient Active Problem List   Diagnosis Date Noted  . Type 2 diabetes mellitus with hyperglycemia, without long-term current use of insulin ([place]) 06/18/2016  . History of right knee joint replacement 06/17/2016  . History of left hip replacement 06/17/2016  . History of vaginal hysterectomy 06/17/2016  . OA (osteoarthritis) of knee 11/03/2015  . Hyperlipidemia associated with type 2 diabetes mellitus ([place]) 07/23/2014  . Genu valgum 08/11/2012  . Overweight 02/25/2010  . Hypertension associated with diabetes ([place]) 02/25/2010    Past Medical History:  Diagnosis Date  . Anemia   . Cataract   . Diabetes mellitus without complication ([place])   . Gastric ulcer 11/17/2011  . History of total left hip replacement 2011  . HTN (hypertension)   . Hx of total knee replacement 2017   11-03-2015   . Hyperlipemia   . Hyperplastic polyp of intestine 11/17/2011  . Hypertension   . Measles   . Menopause   . Migraine   . Osteoarthritis     Past Surgical History:  Procedure Laterality Date  . CARPAL TUNNEL RELEASE Right   . CATARACT EXTRACTION Bilateral   . HIP ARTHROPLASTY Left 2011  . JOINT REPLACEMENT    . RETINAL DETACHMENT SURGERY    . TOTAL KNEE ARTHROPLASTY Right 11/03/2015   Procedure: RIGHT  TOTAL KNEE ARTHROPLASTY;  Surgeon: Gaynelle Arabian, MD;  Location: [facility];  Service: Orthopedics;  Laterality: Right;  . TOTAL VAGINAL HYSTERECTOMY  2012     Current Outpatient Medications  Medication Sig Dispense Refill  . ACCU-CHEK FASTCLIX LANCETS MISC Check blood sugars 1-2 times per day. 100 each 2  . ACCU-CHEK GUIDE test strip TEST FOUR TIMES DAILY. 100 each 6  . aspirin EC 81 MG tablet Take 81 mg by mouth daily.    . blood glucose meter kit and supplies Dispense based on patient and insurance preference. Use up to four times daily as directed. (FOR ICD-9 250.00, 250.01). 1 each 0  . fluticasone (FLONASE) 50 MCG/ACT nasal spray Place 1 spray into both nostrils 2 (two) times daily as needed for allergies or rhinitis. 16 g 6  . glipiZIDE (GLUCOTROL) 5 MG tablet Take 1 tablet (5 mg total) by mouth daily before breakfast. (Patient taking differently: Take 2.5 mg by mouth daily before breakfast. ) 90 tablet 3  . meclizine (ANTIVERT) 25 MG tablet Take 1 tablet (25 mg total) by mouth 3 (three) times daily as needed for  dizziness. 30 tablet 0  . metFORMIN (GLUCOPHAGE) 1000 MG tablet Take 1 tablet (1,000 mg total) by mouth 2 (two) times daily with a meal. 180 tablet 3  . NONFORMULARY OR COMPOUNDED ITEM Estradiol cream 0.02%, use 1/2 a gram vaginally qhs x 5 days, the 2 x a week at hs. 3 month supply sent, no refills 3 each 0  . pravastatin (PRAVACHOL) 40 MG tablet TAKE 1 TABLET BY MOUTH EVERY DAY 90 tablet 1   No current facility-administered medications for this visit.      ALLERGIES: Probiotic [acidophilus]  Family History  Problem Relation Age of Onset  . Diabetes Mother        deceased  . Heart attack Mother   . Diabetes Sister        x2  . Kidney cancer Father   . Heart attack Father   . Diabetes Sister     Social History   Socioeconomic History  . Marital status: Divorced    Spouse name: Not on file  . Number of children: Not on file  . Years of education: Not on file  .  Highest education level: Not on file  Occupational History  . Occupation: retired    Fish farm manager: RETIRED  Social Needs  . Financial resource strain: Not on file  . Food insecurity:    Worry: Not on file    Inability: Not on file  . Transportation needs:    Medical: Not on file    Non-medical: Not on file  Tobacco Use  . Smoking status: Never Smoker  . Smokeless tobacco: Never Used  Substance and Sexual Activity  . Alcohol use: No  . Drug use: No  . Sexual activity: Never    Partners: Male    Birth control/protection: Post-menopausal, Surgical  Lifestyle  . Physical activity:    Days per week: Not on file    Minutes per session: Not on file  . Stress: Not on file  Relationships  . Social connections:    Talks on phone: Not on file    Gets together: Not on file    Attends religious service: Not on file    Active member of club or organization: Not on file    Attends meetings of clubs or organizations: Not on file    Relationship status: Not on file  . Intimate partner violence:    Fear of current or ex partner: Not on file    Emotionally abused: Not on file    Physically abused: Not on file    Forced sexual activity: Not on file  Other Topics Concern  . Not on file  Social History Narrative   Divorced, 2 children; retired.     Review of Systems  Constitutional: Negative.   HENT: Negative.   Eyes: Negative.   Respiratory: Negative.   Cardiovascular: Negative.   Gastrointestinal: Negative.   Genitourinary: Negative.   Musculoskeletal: Negative.   Skin: Negative.   Neurological: Negative.   Endo/Heme/Allergies: Negative.   Psychiatric/Behavioral: Negative.     PHYSICAL EXAMINATION:    BP 124/78 (BP Location: Right Arm, Patient Position: Sitting, Cuff Size: Normal)   Pulse 84   Resp 14   Wt 142 lb (64.4 kg)   BMI 27.73 kg/m     General appearance: alert, cooperative and appears stated age   Pelvic: External genitalia:  no lesions              Urethra:   normal appearing urethra with no masses, tenderness or  lesions              Bartholins and Skenes: normal                 Vagina: normal appearing vagina with normal color and discharge, no lesions. The apex is healed, no vaginal irritation. Pessary replacement              Cervix: absent  Chaperone was present for exam.  ASSESSMENT Genital prolapse, issues with vaginal irritation, currently healed    PLAN She will use her estrogen cream 3 x a week Try and just use the pessary as needed F/U in one month Annual exam with Dr Juleen China in April   An After Visit Summary was printed and given to the patient.  Addendum: op note from 04/22/08 reviewed. Procedure: TVH, TVT, anterior repair. No mesh was used for the anterior repair, just the TVT.

## 2017-06-11 ENCOUNTER — Other Ambulatory Visit: Payer: Self-pay | Admitting: Family Medicine

## 2017-06-11 DIAGNOSIS — E1165 Type 2 diabetes mellitus with hyperglycemia: Secondary | ICD-10-CM

## 2017-06-16 NOTE — Progress Notes (Signed)
Misty Cervantes is a 72 y.o. female is here for follow up.  History of Present Illness:   HPI: Patient is coming in for her 16-month diabetes follow-up.  As before, she is doing very well.  She checks her blood sugars about twice daily.  Morning her fasting was 125.  She reports that her highest blood sugar in the afternoon has been 160 and that her lowest blood sugar, during which time she felt sweaty, was 60.  She continues to monitor her diet.  She is not exercising as much as she was before.  She denies any chest pain, shortness of breath, headaches, dizziness, edema.  She is currently being followed by gynecology and they are working on finding the right pessary fit.  She feels hopeful that this current one will be the one that works.  She has an appointment on the 18th to meet with Cassie, our health coach.  Health Maintenance Due  Topic Date Due  . Hepatitis C Screening  03/20/1945  . TETANUS/TDAP  12/31/1963  . PNA vac Low Risk Adult (2 of 2 - PCV13) 01/01/2012  . HEMOGLOBIN A1C  06/17/2017   Depression screen PHQ 2/9 07/22/2016 06/28/2016 09/29/2015  Decreased Interest 0 0 0  Down, Depressed, Hopeless 0 0 0  PHQ - 2 Score 0 0 0   PMHx, SurgHx, SocialHx, FamHx, Medications, and Allergies were reviewed in the Visit Navigator and updated as appropriate.   Patient Active Problem List   Diagnosis Date Noted  . Type 2 diabetes mellitus with hyperglycemia, without long-term current use of insulin ([place]) 06/18/2016  . History of right knee joint replacement 06/17/2016  . History of left hip replacement 06/17/2016  . History of vaginal hysterectomy 06/17/2016  . OA (osteoarthritis) of knee 11/03/2015  . Hyperlipidemia associated with type 2 diabetes mellitus ([place]) 07/23/2014  . Genu valgum 08/11/2012  . Overweight 02/25/2010  . Hypertension associated with diabetes ([place]) 02/25/2010   Social History   Tobacco Use  . Smoking status: Never Smoker  . Smokeless tobacco: Never Used    Substance Use Topics  . Alcohol use: No  . Drug use: No   Current Medications and Allergies:   .  aspirin EC 81 MG tablet, Take 81 mg by mouth daily., Disp: , Rfl:  .  fluticasone (FLONASE) 50 MCG/ACT nasal spray, Place 1 spray into both nostrils 2 (two) times daily as needed for allergies or rhinitis., Disp: 16 g, Rfl: 6 .  glipiZIDE (GLUCOTROL) 5 MG tablet, TAKE 1 TABLET (5 MG TOTAL) BY MOUTH DAILY BEFORE BREAKFAST., Disp: 90 tablet, Rfl: 3 .  meclizine (ANTIVERT) 25 MG tablet, Take 1 tablet (25 mg total) by mouth 3 (three) times daily as needed for dizziness., Disp: 30 tablet, Rfl: 0 .  metFORMIN (GLUCOPHAGE) 1000 MG tablet, TAKE 1 TABLET BY MOUTH 2 (TWO) TIMES DAILY WITH A MEAL., Disp: 180 tablet, Rfl: 3 .  NONFORMULARY OR COMPOUNDED ITEM, Estradiol cream 0.02%, use 1/2 a gram vaginally qhs x 5 days, the 2 x a week at hs. 3 month supply sent, no refills, Disp: 3 each, Rfl: 0 .  pravastatin (PRAVACHOL) 40 MG tablet, TAKE 1 TABLET BY MOUTH EVERY DAY, Disp: 90 tablet, Rfl: 1   Allergies  Allergen Reactions  . Probiotic [Acidophilus]    Review of Systems   Pertinent items are noted in the HPI. Otherwise, ROS is negative.  Vitals:   Vitals:   06/17/17 0957  BP: (!) 136/94  Pulse: (!) 101  Temp: 98.2 F (36.8 C)  TempSrc: Oral  SpO2: 98%  Weight: 142 lb 12.8 oz (64.8 kg)  Height: 5' (1.524 m)     Body mass index is 27.89 kg/m.   Physical Exam:   Physical Exam  Constitutional: She is oriented to person, place, and time. She appears well-developed and well-nourished. No distress.  HENT:  Head: Normocephalic and atraumatic.  Eyes: Pupils are equal, round, and reactive to light. EOM are normal.  Neck: Normal range of motion. Neck supple.  Cardiovascular: Normal rate, regular rhythm, normal heart sounds and intact distal pulses.  Pulmonary/Chest: Effort normal.  Abdominal: Soft.  Musculoskeletal: Normal range of motion.  Neurological: She is alert and oriented to person,  place, and time.  Skin: Skin is warm.  Psychiatric: She has a normal mood and affect. Her behavior is normal.  Nursing note and vitals reviewed.   Diabetic Foot Exam - Simple   Simple Foot Form Diabetic Foot exam was performed with the following findings:  Yes 06/17/2017 10:51 AM  Visual Inspection No deformities, no ulcerations, no other skin breakdown bilaterally:  Yes Sensation Testing Intact to touch and monofilament testing bilaterally:  Yes Pulse Check Posterior Tibialis and Dorsalis pulse intact bilaterally:  Yes Comments     Assessment and Plan:   Misty Cervantes was seen today for diabetes.  Diagnoses and all orders for this visit:  Type 2 diabetes mellitus with hyperglycemia, without long-term current use of insulin ([facility]) Comments: A1c is controlled at 5.6.  Okay to discontinue glipizide.  Decreasing metformin to 750 mg XR. Orders: -     metFORMIN (GLUCOPHAGE XR) 750 MG 24 hr tablet; Take 1 tablet (750 mg total) by mouth daily with breakfast. -     POCT glycosylated hemoglobin (Hb A1C)  Hyperlipidemia associated with type 2 diabetes mellitus ([place]) Comments: Controlled.  Not due yet for a fasting lipid panel.  Hypertension associated with diabetes ([place]) Comments: Slightly elevated today.  She left prior to the recheck.This is usually elevated at the beginning of the visit.  Overweight Comments: Weight is stable.  We reviewed healthy food choices and regular exercise.   . Reviewed expectations re: course of current medical issues. . Discussed self-management of symptoms. . Outlined signs and symptoms indicating need for more acute intervention. . Patient verbalized understanding and all questions were answered. Marland Kitchen Health Maintenance issues including appropriate healthy diet, exercise, and smoking avoidance were discussed with patient. . See orders for this visit as documented in the electronic medical record. . Patient received an After Visit Summary.  Briscoe Deutscher,  DO [place] 06/17/2017  Future Appointments  Date Time Provider [place]  06/30/2017 10:00 AM Williemae Area, RN [facility]  07/08/2017 10:00 AM Salvadore Dom, MD [facility] None  12/19/2017 10:00 AM Briscoe Deutscher, DO [facility]

## 2017-06-17 ENCOUNTER — Encounter: Payer: Self-pay | Admitting: Family Medicine

## 2017-06-17 ENCOUNTER — Ambulatory Visit (INDEPENDENT_AMBULATORY_CARE_PROVIDER_SITE_OTHER): Payer: Medicare Other | Admitting: Family Medicine

## 2017-06-17 VITALS — BP 136/94 | HR 101 | Temp 98.2°F | Ht 60.0 in | Wt 142.8 lb

## 2017-06-17 DIAGNOSIS — E1169 Type 2 diabetes mellitus with other specified complication: Secondary | ICD-10-CM | POA: Diagnosis not present

## 2017-06-17 DIAGNOSIS — E785 Hyperlipidemia, unspecified: Secondary | ICD-10-CM | POA: Diagnosis not present

## 2017-06-17 DIAGNOSIS — E663 Overweight: Secondary | ICD-10-CM

## 2017-06-17 DIAGNOSIS — I10 Essential (primary) hypertension: Secondary | ICD-10-CM

## 2017-06-17 DIAGNOSIS — E1165 Type 2 diabetes mellitus with hyperglycemia: Secondary | ICD-10-CM | POA: Diagnosis not present

## 2017-06-17 DIAGNOSIS — E1159 Type 2 diabetes mellitus with other circulatory complications: Secondary | ICD-10-CM

## 2017-06-17 DIAGNOSIS — I152 Hypertension secondary to endocrine disorders: Secondary | ICD-10-CM

## 2017-06-17 LAB — POCT GLYCOSYLATED HEMOGLOBIN (HGB A1C): Hemoglobin A1C: 5.6

## 2017-06-17 MED ORDER — METFORMIN HCL ER 750 MG PO TB24: 750.0000 mg | ORAL_TABLET | Freq: Every day | ORAL | 2 refills | Status: DC

## 2017-06-17 NOTE — Patient Instructions (Signed)
Lab Results  Component Value Date   HGBA1C 5.6 12/17/2016   Great Job!  Okay to stop the glipizide. I also called in the lower dose of Metformin so that you can take it once daily.

## 2017-06-20 ENCOUNTER — Other Ambulatory Visit: Payer: Self-pay | Admitting: Family Medicine

## 2017-06-20 DIAGNOSIS — J069 Acute upper respiratory infection, unspecified: Secondary | ICD-10-CM

## 2017-06-20 NOTE — Telephone Encounter (Signed)
Copied from [place] [id number]. Topic: Inquiry >> Jun 20, 2017  1:48 PM Pricilla Handler wrote: Reason for CRM: Patient called requesting a refill of fluticasone (FLONASE) 50 MCG/ACT nasal spray. [facility] [place] [phone] (Phone) [phone] (Fax)

## 2017-06-20 NOTE — Telephone Encounter (Signed)
Request for Flonase nasal spray refill; never been filled per Dr. Juleen China before Last office visit 06/17/17 PCP Dr. Juleen China [facility] [address]

## 2017-06-20 NOTE — Telephone Encounter (Signed)
See note

## 2017-06-21 MED ORDER — FLUTICASONE PROPIONATE 50 MCG/ACT NA SUSP: 1.0000 | Freq: Two times a day (BID) | NASAL | 6 refills | Status: DC | PRN

## 2017-06-29 ENCOUNTER — Ambulatory Visit: Payer: No Typology Code available for payment source | Admitting: *Deleted

## 2017-06-30 ENCOUNTER — Ambulatory Visit: Payer: Medicare Other | Admitting: *Deleted

## 2017-07-04 NOTE — Progress Notes (Deleted)
Subjective:   Misty Cervantes is a 72 y.o. female who presents for Medicare Annual (Subsequent) preventive examination.  Review of Systems:  No ROS.  Medicare Wellness Visit. Additional risk factors are reflected in the social history.        Objective:     Vitals: There were no vitals taken for this visit.  There is no height or weight on file to calculate BMI.  Advanced Directives 07/22/2016 06/28/2016 11/03/2015 10/27/2015  Does Patient Have a Medical Advance Directive? No No No No  Does patient want to make changes to medical advance directive? - - - Yes - information given  Copy of [place] in Chart? - - No - copy requested No - copy requested  Would patient like information on creating a medical advance directive? No - Patient declined Yes (MAU/Ambulatory/Procedural Areas - Information given) No - patient declined information No - patient declined information    Tobacco Social History   Tobacco Use  Smoking Status Never Smoker  Smokeless Tobacco Never Used     Counseling given: Not Answered  Past Medical History:  Diagnosis Date  . Anemia   . Cataract   . Diabetes mellitus without complication ([place])   . Gastric ulcer 11/17/2011  . History of total left hip replacement 2011  . HTN (hypertension)   . Hx of total knee replacement 2017   11-03-2015   . Hyperlipemia   . Hyperplastic polyp of intestine 11/17/2011  . Hypertension   . Measles   . Menopause   . Migraine   . Osteoarthritis    Past Surgical History:  Procedure Laterality Date  . CARPAL TUNNEL RELEASE Right   . CATARACT EXTRACTION Bilateral   . HIP ARTHROPLASTY Left 2011  . JOINT REPLACEMENT    . RETINAL DETACHMENT SURGERY    . TOTAL KNEE ARTHROPLASTY Right 11/03/2015   Procedure: RIGHT TOTAL KNEE ARTHROPLASTY;  Surgeon: Gaynelle Arabian, MD;  Location: [facility];  Service: Orthopedics;  Laterality: Right;  . TOTAL VAGINAL HYSTERECTOMY  2012   TVH, anterior repair, TVT (no mesh used  for the anterior repair)   Family History  Problem Relation Age of Onset  . Diabetes Mother        deceased  . Heart attack Mother   . Diabetes Sister        x2  . Kidney cancer Father   . Heart attack Father   . Diabetes Sister    Social History   Socioeconomic History  . Marital status: Divorced    Spouse name: Not on file  . Number of children: Not on file  . Years of education: Not on file  . Highest education level: Not on file  Occupational History  . Occupation: retired    Fish farm manager: RETIRED  Social Needs  . Financial resource strain: Not on file  . Food insecurity:    Worry: Not on file    Inability: Not on file  . Transportation needs:    Medical: Not on file    Non-medical: Not on file  Tobacco Use  . Smoking status: Never Smoker  . Smokeless tobacco: Never Used  Substance and Sexual Activity  . Alcohol use: No  . Drug use: No  . Sexual activity: Never    Partners: Male    Birth control/protection: Post-menopausal, Surgical  Lifestyle  . Physical activity:    Days per week: Not on file    Minutes per session: Not on file  . Stress: Not  on file  Relationships  . Social connections:    Talks on phone: Not on file    Gets together: Not on file    Attends religious service: Not on file    Active member of club or organization: Not on file    Attends meetings of clubs or organizations: Not on file    Relationship status: Not on file  Other Topics Concern  . Not on file  Social History Narrative   Divorced, 2 children; retired.     Outpatient Encounter Medications as of 07/06/2017  Medication Sig  . ACCU-CHEK FASTCLIX LANCETS MISC Check blood sugars 1-2 times per day.  Marland Kitchen ACCU-CHEK GUIDE test strip TEST FOUR TIMES DAILY.  Marland Kitchen aspirin EC 81 MG tablet Take 81 mg by mouth daily.  . blood glucose meter kit and supplies Dispense based on patient and insurance preference. Use up to four times daily as directed. (FOR ICD-9 250.00, 250.01).  . fluticasone  (FLONASE) 50 MCG/ACT nasal spray Place 1 spray into both nostrils 2 (two) times daily as needed for allergies or rhinitis.  Marland Kitchen meclizine (ANTIVERT) 25 MG tablet Take 1 tablet (25 mg total) by mouth 3 (three) times daily as needed for dizziness.  . metFORMIN (GLUCOPHAGE XR) 750 MG 24 hr tablet Take 1 tablet (750 mg total) by mouth daily with breakfast.  . NONFORMULARY OR COMPOUNDED ITEM Estradiol cream 0.02%, use 1/2 a gram vaginally qhs x 5 days, the 2 x a week at hs. 3 month supply sent, no refills  . pravastatin (PRAVACHOL) 40 MG tablet TAKE 1 TABLET BY MOUTH EVERY DAY   No facility-administered encounter medications on file as of 07/06/2017.     Activities of Daily Living No flowsheet data found.  Patient Care Team: Briscoe Deutscher, DO as PCP - General (Family Medicine) Gaynelle Arabian, MD as Consulting Physician (Orthopedic Surgery)    Assessment:   This is a routine wellness examination for Fox.  Exercise Activities and Dietary recommendations    Goals    None      Fall Risk Fall Risk  07/22/2016 06/28/2016 09/24/2015 08/25/2015 07/23/2014  Falls in the past year? _0    Depression Screen PHQ 2/9 Scores 07/22/2016 06/28/2016 09/29/2015 09/24/2015  PHQ - 2 Score 0 0 0 0     Cognitive Function        Immunization History  Administered Date(s) Administered  . Influenza,inj,Quad PF,6+ Mos 01/28/2015  . Pneumococcal Polysaccharide-23 01/01/2011   Screening Tests Health Maintenance  Topic Date Due  . Hepatitis C Screening  May 27, 1945  . TETANUS/TDAP  12/31/1963  . PNA vac Low Risk Adult (2 of 2 - PCV13) 01/01/2012  . URINE MICROALBUMIN  06/30/2017  . OPHTHALMOLOGY EXAM  08/06/2017  . INFLUENZA VACCINE  10/13/2017  . HEMOGLOBIN A1C  12/17/2017  . MAMMOGRAM  04/12/2018  . FOOT EXAM  06/18/2018  . COLONOSCOPY  11/16/2021  . DEXA SCAN  Completed      Plan:   Follow up with PCP as directed.  I have personally reviewed and noted the following in the patient's  chart:   . Medical and social history . Use of alcohol, tobacco or illicit drugs  . Current medications and supplements . Functional ability and status . Nutritional status . Physical activity . Advanced directives . List of other physicians . Vitals . Screenings to include cognitive, depression, and falls . Referrals and appointments  In addition, I have reviewed and discussed with patient certain preventive protocols, quality  metrics, and best practice recommendations. A written personalized care plan for preventive services as well as general preventive health recommendations were provided to patient.     Williemae Area, RN  07/04/2017

## 2017-07-04 NOTE — Progress Notes (Deleted)
PCP notes:   Health maintenance: Hepatitis C Screening: Tdap: PCV13:  Abnormal screenings:    Patient concerns:    Nurse concerns:   Next PCP appt: 12/19/2017

## 2017-07-06 ENCOUNTER — Ambulatory Visit: Payer: Medicare Other | Admitting: *Deleted

## 2017-07-08 ENCOUNTER — Ambulatory Visit: Payer: Medicare Other | Admitting: Obstetrics and Gynecology

## 2017-07-10 ENCOUNTER — Other Ambulatory Visit: Payer: Self-pay | Admitting: Family Medicine

## 2017-07-10 DIAGNOSIS — I10 Essential (primary) hypertension: Secondary | ICD-10-CM

## 2017-07-13 ENCOUNTER — Ambulatory Visit: Payer: Medicare Other | Admitting: Obstetrics and Gynecology

## 2017-07-21 ENCOUNTER — Ambulatory Visit: Payer: Medicare Other | Admitting: Obstetrics and Gynecology

## 2017-07-21 ENCOUNTER — Telehealth: Payer: Self-pay | Admitting: Obstetrics and Gynecology

## 2017-07-21 ENCOUNTER — Encounter: Payer: Self-pay | Admitting: Obstetrics and Gynecology

## 2017-07-21 NOTE — Telephone Encounter (Signed)
Patient called and rescheduled her missed appointment from today to tomorrow. Patient apologized for missing her appointment today.

## 2017-07-21 NOTE — Telephone Encounter (Signed)
Patient did not keep today's 11:30 pessary check. Left a voicemail for her to reschedule.

## 2017-07-22 ENCOUNTER — Ambulatory Visit (INDEPENDENT_AMBULATORY_CARE_PROVIDER_SITE_OTHER): Payer: Medicare Other | Admitting: Obstetrics and Gynecology

## 2017-07-22 ENCOUNTER — Encounter: Payer: Self-pay | Admitting: Obstetrics and Gynecology

## 2017-07-22 ENCOUNTER — Other Ambulatory Visit: Payer: Self-pay

## 2017-07-22 VITALS — BP 136/70 | HR 84 | Resp 14 | Wt 143.0 lb

## 2017-07-22 DIAGNOSIS — N8111 Cystocele, midline: Secondary | ICD-10-CM | POA: Diagnosis not present

## 2017-07-22 DIAGNOSIS — N819 Female genital prolapse, unspecified: Secondary | ICD-10-CM

## 2017-07-22 DIAGNOSIS — Z4689 Encounter for fitting and adjustment of other specified devices: Secondary | ICD-10-CM

## 2017-07-22 NOTE — Progress Notes (Signed)
GYNECOLOGY  VISIT   HPI: 72 y.o.   Divorced  Caucasian  female   G2P2001 with No LMP recorded. Patient has had a hysterectomy.   here for follow up pessary check. She is using the pessary intermittently secondary to vaginal irritation with regular use. She has been using the pessary 3-4 x a week for a few hours to a full day. Using a compounded estrogen cream 3 x a week.   H/O TVH.  GYNECOLOGIC HISTORY: No LMP recorded. Patient has had a hysterectomy. Contraception:hysterectomy  Menopausal hormone therapy: none         OB History    Gravida  2   Para  2   Term  2   Preterm      AB      Living  1     SAB      TAB      Ectopic      Multiple      Live Births  2              Patient Active Problem List   Diagnosis Date Noted  . Type 2 diabetes mellitus with hyperglycemia, without long-term current use of insulin ([place]) 06/18/2016  . History of right knee joint replacement 06/17/2016  . History of left hip replacement 06/17/2016  . History of vaginal hysterectomy 06/17/2016  . OA (osteoarthritis) of knee 11/03/2015  . Hyperlipidemia associated with type 2 diabetes mellitus ([place]) 07/23/2014  . Genu valgum 08/11/2012  . Overweight 02/25/2010  . Hypertension associated with diabetes ([place]) 02/25/2010    Past Medical History:  Diagnosis Date  . Anemia   . Cataract   . Diabetes mellitus without complication ([place])   . Gastric ulcer 11/17/2011  . History of total left hip replacement 2011  . HTN (hypertension)   . Hx of total knee replacement 2017   11-03-2015   . Hyperlipemia   . Hyperplastic polyp of intestine 11/17/2011  . Hypertension   . Measles   . Menopause   . Migraine   . Osteoarthritis     Past Surgical History:  Procedure Laterality Date  . CARPAL TUNNEL RELEASE Right   . CATARACT EXTRACTION Bilateral   . HIP ARTHROPLASTY Left 2011  . JOINT REPLACEMENT    . RETINAL DETACHMENT SURGERY    . TOTAL KNEE ARTHROPLASTY Right 11/03/2015    Procedure: RIGHT TOTAL KNEE ARTHROPLASTY;  Surgeon: Gaynelle Arabian, MD;  Location: [facility];  Service: Orthopedics;  Laterality: Right;  . TOTAL VAGINAL HYSTERECTOMY  2012   TVH, anterior repair, TVT (no mesh used for the anterior repair)    Current Outpatient Medications  Medication Sig Dispense Refill  . ACCU-CHEK FASTCLIX LANCETS MISC Check blood sugars 1-2 times per day. 100 each 2  . ACCU-CHEK GUIDE test strip TEST FOUR TIMES DAILY. 100 each 6  . aspirin EC 81 MG tablet Take 81 mg by mouth daily.    . blood glucose meter kit and supplies Dispense based on patient and insurance preference. Use up to four times daily as directed. (FOR ICD-9 250.00, 250.01). 1 each 0  . fluticasone (FLONASE) 50 MCG/ACT nasal spray Place 1 spray into both nostrils 2 (two) times daily as needed for allergies or rhinitis. 16 g 6  . meclizine (ANTIVERT) 25 MG tablet Take 1 tablet (25 mg total) by mouth 3 (three) times daily as needed for dizziness. 30 tablet 0  . metFORMIN (GLUCOPHAGE XR) 750 MG 24 hr tablet Take 1 tablet (750 mg  total) by mouth daily with breakfast. 30 tablet 2  . NONFORMULARY OR COMPOUNDED ITEM Estradiol cream 0.02%, use 1/2 a gram vaginally qhs x 5 days, the 2 x a week at hs. 3 month supply sent, no refills 3 each 0  . pravastatin (PRAVACHOL) 40 MG tablet TAKE 1 TABLET BY MOUTH EVERY DAY 90 tablet 1  . triamterene-hydrochlorothiazide (DYAZIDE) 37.5-25 MG capsule TAKE 1 CAPSULE EACH BY MOUTH DAILY. 90 capsule 3  . phentermine (ADIPEX-P) 37.5 MG tablet   0   No current facility-administered medications for this visit.      ALLERGIES: Probiotic [acidophilus]  Family History  Problem Relation Age of Onset  . Diabetes Mother        deceased  . Heart attack Mother   . Diabetes Sister        x2  . Kidney cancer Father   . Heart attack Father   . Diabetes Sister     Social History   Socioeconomic History  . Marital status: Divorced    Spouse name: Not on file  . Number of children: Not  on file  . Years of education: Not on file  . Highest education level: Not on file  Occupational History  . Occupation: retired    Fish farm manager: RETIRED  Social Needs  . Financial resource strain: Not on file  . Food insecurity:    Worry: Not on file    Inability: Not on file  . Transportation needs:    Medical: Not on file    Non-medical: Not on file  Tobacco Use  . Smoking status: Never Smoker  . Smokeless tobacco: Never Used  Substance and Sexual Activity  . Alcohol use: No  . Drug use: No  . Sexual activity: Never    Partners: Male    Birth control/protection: Post-menopausal, Surgical  Lifestyle  . Physical activity:    Days per week: Not on file    Minutes per session: Not on file  . Stress: Not on file  Relationships  . Social connections:    Talks on phone: Not on file    Gets together: Not on file    Attends religious service: Not on file    Active member of club or organization: Not on file    Attends meetings of clubs or organizations: Not on file    Relationship status: Not on file  . Intimate partner violence:    Fear of current or ex partner: Not on file    Emotionally abused: Not on file    Physically abused: Not on file    Forced sexual activity: Not on file  Other Topics Concern  . Not on file  Social History Narrative   Divorced, 2 children; retired.     Review of Systems  Constitutional: Negative.   HENT: Negative.   Eyes: Negative.   Respiratory: Negative.   Cardiovascular: Negative.   Gastrointestinal: Negative.   Genitourinary: Negative.   Musculoskeletal: Negative.   Skin: Negative.   Neurological: Negative.   Endo/Heme/Allergies: Negative.   Psychiatric/Behavioral: Negative.     PHYSICAL EXAMINATION:    BP 136/70 (BP Location: Right Arm, Patient Position: Sitting, Cuff Size: Normal)   Pulse 84   Resp 14   Wt 143 lb (64.9 kg)   BMI 27.93 kg/m     General appearance: alert, cooperative and appears stated age  Pelvic: External  genitalia:  no lesions              Urethra:  normal appearing  urethra with no masses, tenderness or lesions              Bartholins and Skenes: normal                 Vagina: healthy, estrogenized appearing vagina with normal color and discharge, no lesions or irritation. Prolapse unchanged              Cervix:absent  Chaperone was present for exam.  ASSESSMENT H/O grade 3 cystocele, grade 1 vault prolapse H/O recurrent vaginal irritation with the pessary, currently doing very well. No vaginal irritation    PLAN She will try increasing the use of the pessary  For now will continue with the vaginal estrogen 3 x a week F/U in 6 weeks, call with any concerns   An After Visit Summary was printed and given to the patient.

## 2017-07-26 ENCOUNTER — Other Ambulatory Visit: Payer: Self-pay | Admitting: Family Medicine

## 2017-07-26 DIAGNOSIS — R42 Dizziness and giddiness: Secondary | ICD-10-CM

## 2017-07-26 NOTE — Telephone Encounter (Signed)
Please advise on refill.

## 2017-08-03 ENCOUNTER — Ambulatory Visit: Payer: Medicare Other | Admitting: *Deleted

## 2017-08-23 ENCOUNTER — Ambulatory Visit: Payer: Medicare Other | Admitting: *Deleted

## 2017-08-25 ENCOUNTER — Other Ambulatory Visit: Payer: Self-pay | Admitting: Family Medicine

## 2017-09-05 ENCOUNTER — Other Ambulatory Visit: Payer: Self-pay | Admitting: Family Medicine

## 2017-09-05 DIAGNOSIS — Z1231 Encounter for screening mammogram for malignant neoplasm of breast: Secondary | ICD-10-CM

## 2017-09-07 ENCOUNTER — Ambulatory Visit
Admission: RE | Admit: 2017-09-07 | Discharge: 2017-09-07 | Disposition: A | Discharge status: Home / Self Care | Payer: Medicare Other | Source: Ambulatory Visit | Attending: Family Medicine | Admitting: Family Medicine

## 2017-09-07 ENCOUNTER — Ambulatory Visit: Payer: Medicare Other | Admitting: Obstetrics and Gynecology

## 2017-09-07 DIAGNOSIS — Z1231 Encounter for screening mammogram for malignant neoplasm of breast: Secondary | ICD-10-CM | POA: Diagnosis not present

## 2017-09-08 ENCOUNTER — Other Ambulatory Visit: Payer: Self-pay | Admitting: Family Medicine

## 2017-09-08 DIAGNOSIS — E1165 Type 2 diabetes mellitus with hyperglycemia: Secondary | ICD-10-CM

## 2017-09-20 ENCOUNTER — Ambulatory Visit: Payer: Medicare Other | Admitting: *Deleted

## 2017-09-20 ENCOUNTER — Other Ambulatory Visit: Payer: Self-pay | Admitting: Family Medicine

## 2017-09-20 DIAGNOSIS — R42 Dizziness and giddiness: Secondary | ICD-10-CM

## 2017-09-20 NOTE — Telephone Encounter (Signed)
Please advise on refill.

## 2017-09-21 NOTE — Progress Notes (Signed)
GYNECOLOGY  VISIT   HPI: 72 y.o.   Divorced  Caucasian  female   G2P2001 with No LMP recorded. Patient has had a hysterectomy.   here for pessary recheck. The patient has a h/o a grade 3 cystocele and grade 1 vault prolapse. She has had recurrent issues with vaginal irritation from the pessary. At her last visit 2 months ago her vagina was not irritated. She has increased her use of the pessary, uses it when she is very active. Not using it daily.  She feels she voids better with the pessary in. When she first puts the pessary in she has some urge to void. She can have a small amount of urge incontinence with or without the pessary. No change. Tolerable.   GYNECOLOGIC HISTORY: No LMP recorded. Patient has had a hysterectomy. Contraception: Hysterectomy  Menopausal hormone therapy: None        OB History    Gravida  2   Para  2   Term  2   Preterm      AB      Living  1     SAB      TAB      Ectopic      Multiple      Live Births  2              Patient Active Problem List   Diagnosis Date Noted  . Type 2 diabetes mellitus with hyperglycemia, without long-term current use of insulin ([place]) 06/18/2016  . History of right knee joint replacement 06/17/2016  . History of left hip replacement 06/17/2016  . History of vaginal hysterectomy 06/17/2016  . OA (osteoarthritis) of knee 11/03/2015  . Hyperlipidemia associated with type 2 diabetes mellitus ([place]) 07/23/2014  . Genu valgum 08/11/2012  . Overweight 02/25/2010  . Hypertension associated with diabetes ([place]) 02/25/2010    Past Medical History:  Diagnosis Date  . Anemia   . Cataract   . Diabetes mellitus without complication ([place])   . Gastric ulcer 11/17/2011  . History of total left hip replacement 2011  . HTN (hypertension)   . Hx of total knee replacement 2017   11-03-2015   . Hyperlipemia   . Hyperplastic polyp of intestine 11/17/2011  . Hypertension   . Measles   . Menopause   . Migraine   .  Osteoarthritis     Past Surgical History:  Procedure Laterality Date  . CARPAL TUNNEL RELEASE Right   . CATARACT EXTRACTION Bilateral   . HIP ARTHROPLASTY Left 2011  . JOINT REPLACEMENT    . RETINAL DETACHMENT SURGERY    . TOTAL KNEE ARTHROPLASTY Right 11/03/2015   Procedure: RIGHT TOTAL KNEE ARTHROPLASTY;  Surgeon: Gaynelle Arabian, MD;  Location: [facility];  Service: Orthopedics;  Laterality: Right;  . TOTAL VAGINAL HYSTERECTOMY  2012   TVH, anterior repair, TVT (no mesh used for the anterior repair)    Current Outpatient Medications  Medication Sig Dispense Refill  . ACCU-CHEK FASTCLIX LANCETS MISC Check blood sugars 1-2 times per day. 100 each 2  . ACCU-CHEK GUIDE test strip TEST FOUR TIMES DAILY. 100 each 6  . aspirin EC 81 MG tablet Take 81 mg by mouth daily.    . blood glucose meter kit and supplies Dispense based on patient and insurance preference. Use up to four times daily as directed. (FOR ICD-9 250.00, 250.01). 1 each 0  . fluticasone (FLONASE) 50 MCG/ACT nasal spray Place 1 spray into both nostrils 2 (two) times  daily as needed for allergies or rhinitis. 16 g 6  . meclizine (ANTIVERT) 25 MG tablet TAKE 1 TABLET (25 MG TOTAL) BY MOUTH 3 (THREE) TIMES DAILY AS NEEDED FOR DIZZINESS. 30 tablet 0  . metFORMIN (GLUCOPHAGE-XR) 750 MG 24 hr tablet TAKE 1 TABLET BY MOUTH DAILY WITH BREAKFAST. 90 tablet 0  . NONFORMULARY OR COMPOUNDED ITEM Estradiol cream 0.02%, use 1/2 a gram vaginally qhs x 5 days, the 2 x a week at hs. 3 month supply sent, no refills 3 each 0  . phentermine (ADIPEX-P) 37.5 MG tablet   0  . pravastatin (PRAVACHOL) 40 MG tablet TAKE 1 TABLET BY MOUTH EVERY DAY 90 tablet 1  . triamterene-hydrochlorothiazide (DYAZIDE) 37.5-25 MG capsule TAKE 1 CAPSULE EACH BY MOUTH DAILY. 90 capsule 3   No current facility-administered medications for this visit.      ALLERGIES: Probiotic [acidophilus]  Family History  Problem Relation Age of Onset  . Diabetes Mother        deceased   . Heart attack Mother   . Diabetes Sister        x2  . Kidney cancer Father   . Heart attack Father   . Diabetes Sister     Social History   Socioeconomic History  . Marital status: Divorced    Spouse name: Not on file  . Number of children: Not on file  . Years of education: Not on file  . Highest education level: Not on file  Occupational History  . Occupation: retired    Fish farm manager: RETIRED  Social Needs  . Financial resource strain: Not on file  . Food insecurity:    Worry: Not on file    Inability: Not on file  . Transportation needs:    Medical: Not on file    Non-medical: Not on file  Tobacco Use  . Smoking status: Never Smoker  . Smokeless tobacco: Never Used  Substance and Sexual Activity  . Alcohol use: No  . Drug use: No  . Sexual activity: Not Currently    Partners: Male    Birth control/protection: Post-menopausal, Surgical  Lifestyle  . Physical activity:    Days per week: Not on file    Minutes per session: Not on file  . Stress: Not on file  Relationships  . Social connections:    Talks on phone: Not on file    Gets together: Not on file    Attends religious service: Not on file    Active member of club or organization: Not on file    Attends meetings of clubs or organizations: Not on file    Relationship status: Not on file  . Intimate partner violence:    Fear of current or ex partner: Not on file    Emotionally abused: Not on file    Physically abused: Not on file    Forced sexual activity: Not on file  Other Topics Concern  . Not on file  Social History Narrative   Divorced, 2 children; retired.     Review of Systems  Constitutional: Negative.   HENT: Negative.   Eyes: Negative.   Respiratory: Negative.   Cardiovascular: Negative.   Gastrointestinal: Negative.   Genitourinary: Negative.   Musculoskeletal: Negative.   Skin: Negative.   Neurological: Negative.   Endo/Heme/Allergies: Negative.   Psychiatric/Behavioral: Negative.      PHYSICAL EXAMINATION:    BP 126/78 (BP Location: Right Arm, Patient Position: Sitting)   Pulse 88   Wt 144 lb (65.3 kg)  BMI 28.12 kg/m     General appearance: alert, cooperative and appears stated age  Pelvic: External genitalia:  no lesions              Urethra:  normal appearing urethra with no masses, tenderness or lesions              Bartholins and Skenes: normal                 Vagina: well estrogenized appearing vagina with normal color and discharge, no lesions or irritation. Cystocele noted.               Cervix: absent  Chaperone was present for exam.  ASSESSMENT Genital prolapse Pessary check, doing better, no vaginal irritation Mild urge incontinence, tolerable    PLAN She will increase the use of her pessary some Continue with the compounded vaginal estrogen cream 2 x a week F/U in 3 months Call with any concerns   An After Visit Summary was printed and given to the patient.  ~15 minutes face to face time of which over 50% was spent in counseling.

## 2017-09-22 ENCOUNTER — Encounter: Payer: Self-pay | Admitting: Obstetrics and Gynecology

## 2017-09-22 ENCOUNTER — Ambulatory Visit (INDEPENDENT_AMBULATORY_CARE_PROVIDER_SITE_OTHER): Payer: Medicare Other | Admitting: Obstetrics and Gynecology

## 2017-09-22 ENCOUNTER — Other Ambulatory Visit: Payer: Self-pay

## 2017-09-22 VITALS — BP 126/78 | HR 88 | Wt 144.0 lb

## 2017-09-22 DIAGNOSIS — N8111 Cystocele, midline: Secondary | ICD-10-CM | POA: Diagnosis not present

## 2017-09-22 DIAGNOSIS — N3941 Urge incontinence: Secondary | ICD-10-CM | POA: Diagnosis not present

## 2017-09-22 DIAGNOSIS — Z4689 Encounter for fitting and adjustment of other specified devices: Secondary | ICD-10-CM

## 2017-09-22 DIAGNOSIS — N952 Postmenopausal atrophic vaginitis: Secondary | ICD-10-CM

## 2017-09-22 MED ORDER — NONFORMULARY OR COMPOUNDED ITEM: 2 refills | Status: DC

## 2017-09-22 NOTE — Progress Notes (Signed)
Prescription for estradiol cream 0.02%, #3, 2RF faxed to [facility]. Fax #: [phone].

## 2017-10-21 ENCOUNTER — Ambulatory Visit (INDEPENDENT_AMBULATORY_CARE_PROVIDER_SITE_OTHER): Payer: Medicare Other

## 2017-10-21 VITALS — BP 128/80 | HR 98 | Ht 60.0 in | Wt 143.2 lb

## 2017-10-21 DIAGNOSIS — E119 Type 2 diabetes mellitus without complications: Secondary | ICD-10-CM | POA: Diagnosis not present

## 2017-10-21 DIAGNOSIS — Z1159 Encounter for screening for other viral diseases: Secondary | ICD-10-CM | POA: Diagnosis not present

## 2017-10-21 DIAGNOSIS — Z0000 Encounter for general adult medical examination without abnormal findings: Secondary | ICD-10-CM

## 2017-10-21 LAB — MICROALBUMIN / CREATININE URINE RATIO
Creatinine,U: 78.5 mg/dL
Microalb Creat Ratio: 1.5 mg/g (ref 0.0–30.0)
Microalb, Ur: 1.2 mg/dL (ref 0.0–1.9)

## 2017-10-21 NOTE — Progress Notes (Signed)
Subjective:   Misty Cervantes is a 72 y.o. female who presents for Medicare Annual (Subsequent) preventive examination.  Reports health as Last OV 4/5 2019  She raises 2 grand children one 58 and 9  Was in retail sales  Lives with younger son. The oldest son passes away  Has nieces and nephew  Diet BMI 27/9 Chol/hdl 3  hdl up 41 to 52 A1c 5.6   Keeping BS under 170 and not below 70  Tries to fix meals or plan meals on the run  Very busy taking care of 2 grandchildren  Exercise Tries to walk as much as possible. Will try to get back to exercise    Health Maintenance Due  Topic Date Due  . Hepatitis C Screening  04-21-1945  . TETANUS/TDAP  12/31/1963  . PNA vac Low Risk Adult (2 of 2 - PCV13) 01/01/2012  . URINE MICROALBUMIN  06/30/2017  . OPHTHALMOLOGY EXAM  08/06/2017  . INFLUENZA VACCINE  10/13/2017   Hep C ordered today and will draw at her next blood draw Ur Microablbumin due and specimen taken today  Cardiac Risk Factors include: dyslipidemiaDiabetic eye exam due 07/2016  Colonoscopy 11/2011 repeat 10 years  Mammogram June 2019  Bone density 08/2016  -0.6  Needs Prevnar  And discussed; has function to go to tonight and wanted to postpone.   meds taking compound vaginally and GYN following pessary        Objective:     Vitals: BP 128/80   Pulse 98   Ht 5' (1.524 m)   Wt 143 lb 4 oz (65 kg)   SpO2 93%   BMI 27.98 kg/m   Body mass index is 27.98 kg/m.  Advanced Directives 10/21/2017 07/22/2016 06/28/2016 11/03/2015 10/27/2015  Does Patient Have a Medical Advance Directive? Yes No No No No  Does patient want to make changes to medical advance directive? - - - - Yes - information given  Copy of [place] in Chart? - - - No - copy requested No - copy requested  Would patient like information on creating a medical advance directive? - No - Patient declined Yes (MAU/Ambulatory/Procedural Areas - Information given) No - patient declined  information No - patient declined information    Tobacco Social History   Tobacco Use  Smoking Status Never Smoker  Smokeless Tobacco Never Used     Counseling given: Yes   Clinical Intake:  Past Medical History:  Diagnosis Date  . Anemia   . Cataract   . Diabetes mellitus without complication ([place])   . Gastric ulcer 11/17/2011  . History of total left hip replacement 2011  . HTN (hypertension)   . Hx of total knee replacement 2017   11-03-2015   . Hyperlipemia   . Hyperplastic polyp of intestine 11/17/2011  . Hypertension   . Measles   . Menopause   . Migraine   . Osteoarthritis    Past Surgical History:  Procedure Laterality Date  . CARPAL TUNNEL RELEASE Right   . CATARACT EXTRACTION Bilateral   . HIP ARTHROPLASTY Left 2011  . JOINT REPLACEMENT    . RETINAL DETACHMENT SURGERY    . TOTAL KNEE ARTHROPLASTY Right 11/03/2015   Procedure: RIGHT TOTAL KNEE ARTHROPLASTY;  Surgeon: Gaynelle Arabian, MD;  Location: [facility];  Service: Orthopedics;  Laterality: Right;  . TOTAL VAGINAL HYSTERECTOMY  2012   TVH, anterior repair, TVT (no mesh used for the anterior repair)   Family History  Problem Relation  Age of Onset  . Diabetes Mother        deceased  . Heart attack Mother   . Diabetes Sister        x2  . Kidney cancer Father   . Heart attack Father   . Diabetes Sister    Social History   Socioeconomic History  . Marital status: Divorced    Spouse name: Not on file  . Number of children: Not on file  . Years of education: Not on file  . Highest education level: Not on file  Occupational History  . Occupation: retired    Fish farm manager: RETIRED  Social Needs  . Financial resource strain: Not on file  . Food insecurity:    Worry: Not on file    Inability: Not on file  . Transportation needs:    Medical: Not on file    Non-medical: Not on file  Tobacco Use  . Smoking status: Never Smoker  . Smokeless tobacco: Never Used  Substance and Sexual Activity  . Alcohol  use: No  . Drug use: No  . Sexual activity: Not Currently    Partners: Male    Birth control/protection: Post-menopausal, Surgical  Lifestyle  . Physical activity:    Days per week: Not on file    Minutes per session: Not on file  . Stress: Not on file  Relationships  . Social connections:    Talks on phone: Not on file    Gets together: Not on file    Attends religious service: Not on file    Active member of club or organization: Not on file    Attends meetings of clubs or organizations: Not on file    Relationship status: Not on file  Other Topics Concern  . Not on file  Social History Narrative   Divorced, 2 children; retired.     Outpatient Encounter Medications as of 10/21/2017  Medication Sig  . ACCU-CHEK FASTCLIX LANCETS MISC Check blood sugars 1-2 times per day.  Marland Kitchen ACCU-CHEK GUIDE test strip TEST FOUR TIMES DAILY.  Marland Kitchen aspirin EC 81 MG tablet Take 81 mg by mouth daily.  . blood glucose meter kit and supplies Dispense based on patient and insurance preference. Use up to four times daily as directed. (FOR ICD-9 250.00, 250.01).  . fluticasone (FLONASE) 50 MCG/ACT nasal spray Place 1 spray into both nostrils 2 (two) times daily as needed for allergies or rhinitis.  Marland Kitchen meclizine (ANTIVERT) 25 MG tablet TAKE 1 TABLET (25 MG TOTAL) BY MOUTH 3 (THREE) TIMES DAILY AS NEEDED FOR DIZZINESS.  . metFORMIN (GLUCOPHAGE-XR) 750 MG 24 hr tablet TAKE 1 TABLET BY MOUTH DAILY WITH BREAKFAST.  . NONFORMULARY OR COMPOUNDED ITEM Estradiol cream 0.02%, use 1/2 a gram vaginally 2 x a week at hs. 3 month supply sent, 2 refills  . phentermine (ADIPEX-P) 37.5 MG tablet   . pravastatin (PRAVACHOL) 40 MG tablet TAKE 1 TABLET BY MOUTH EVERY DAY  . triamterene-hydrochlorothiazide (DYAZIDE) 37.5-25 MG capsule TAKE 1 CAPSULE EACH BY MOUTH DAILY.   No facility-administered encounter medications on file as of 10/21/2017.     Activities of Daily Living In your present state of health, do you have any  difficulty performing the following activities: 10/21/2017  Hearing? N  Vision? N  Difficulty concentrating or making decisions? N  Comment has 2 grandchildren   Walking or climbing stairs? N  Dressing or bathing? N  Doing errands, shopping? N  Preparing Food and eating ? N  Using the Toilet?  N  In the past six months, have you accidently leaked urine? Y  Comment wears a pessary  Do you have problems with loss of bowel control? N  Managing your Medications? N  Managing your Finances? N  Housekeeping or managing your Housekeeping? N  Some recent data might be hidden    Patient Care Team: Briscoe Deutscher, DO as PCP - General (Family Medicine) Gaynelle Arabian, MD as Consulting Physician (Orthopedic Surgery)    Assessment:   This is a routine wellness examination for [place].  Exercise Activities and Dietary recommendations Current Exercise Habits: Home exercise routine(carrying 2 children from place to place ), Type of exercise: walking;Other - see comments, Time (Minutes): 60, Frequency (Times/Week): 5, Weekly Exercise (Minutes/Week): 300, Intensity: Moderate  Goals    . DIET - INCREASE WATER INTAKE     Try to drink water at meals; Keep a cooler in the car  Will exercise when she eats or bs is high  [facility] are in [facility]     . Increase exercise to 3 days per week for one hour    . patient     Try to learn how to unwind...  Can try to [facility] back [facility] [employer] and meet back up with friends        Fall Risk Fall Risk  10/21/2017 07/22/2016 06/28/2016 09/24/2015 08/25/2015  Falls in the past year? Yes No No No No  Comment fell on her knee she just had replaced / has been one year  - - - -  Number falls in past yr: 1 - - - -  Injury with Fall? No - - - -  Follow up Education provided - - - -    Depression Screen PHQ 2/9 Scores 10/21/2017 07/22/2016 06/28/2016 09/29/2015  PHQ - 2 Score 0 0 0 0     Cognitive Function MMSE - Mini Mental State Exam 10/21/2017  Not  completed: (No Data)   Ad8 score reviewed for issues:  Issues making decisions:  Less interest in hobbies / activities:  Repeats questions, stories (family complaining):  Trouble using ordinary gadgets (microwave, computer, phone):  Forgets the month or year:   Mismanaging finances:   Remembering appts:  Daily problems with thinking and/or memory: Ad8 score is=          Immunization History  Administered Date(s) Administered  . Influenza,inj,Quad PF,6+ Mos 01/28/2015  . Pneumococcal Polysaccharide-23 01/01/2011     Screening Tests Health Maintenance  Topic Date Due  . Hepatitis C Screening  October 02, 1945  . TETANUS/TDAP  12/31/1963  . PNA vac Low Risk Adult (2 of 2 - PCV13) 01/01/2012  . URINE MICROALBUMIN  06/30/2017  . OPHTHALMOLOGY EXAM  08/06/2017  . INFLUENZA VACCINE  10/13/2017  . HEMOGLOBIN A1C  12/17/2017  . FOOT EXAM  06/18/2018  . MAMMOGRAM  09/08/2019  . COLONOSCOPY  11/16/2021  . DEXA SCAN  Completed         Plan:      PCP Notes   Health Maintenance Hep C ordered today and will draw at her next blood draw Ur Microablbumin due and specimen taken today Colonoscopy 11/2011 repeat 10 years  Mammogram June 2019  Bone density 08/2016  -0.6  Needs Prevnar  And discussed; has function to go to tonight and wanted to postpone.   meds taking compound vaginally and GYN following pessary      Abnormal Screens  none  Referrals  none  Patient concerns;  Left shoulder pain when taking her shirt off; if it gets worse, she will call  And make an apt  Nurse Concerns; As noted  Next PCP apt 10/7     I have personally reviewed and noted the following in the patient's chart:   . Medical and social history . Use of alcohol, tobacco or illicit drugs  . Current medications and supplements . Functional ability and status . Nutritional status . Physical activity . Advanced directives . List of other physicians . Hospitalizations,  surgeries, and ER visits in previous 12 months . Vitals . Screenings to include cognitive, depression, and falls . Referrals and appointments  In addition, I have reviewed and discussed with patient certain preventive protocols, quality metrics, and best practice recommendations. A written personalized care plan for preventive services as well as general preventive health recommendations were provided to patient.     Wynetta Fines, RN  10/21/2017

## 2017-10-21 NOTE — Patient Instructions (Addendum)
[name] , Thank you for taking time to come for your Medicare Wellness Visit. I appreciate your ongoing commitment to your health goals. Please review the following plan we discussed and let me know if I can assist you in the future.   Please schedule an eye exam. You need an eye exam every year   Prefers to take prevnar 13 and flu vaccine the same when she sees Dr. Juleen China. Has plans tonight   Will get your Ua Albumin today    If you have access to the HCPOA or Living will, please bring [place] a copy for the chart  A Tetanus is recommended every 10 years. Medicare covers a tetanus if you have a cut or wound; otherwise, there may be a charge. If you had not had a tetanus with pertusses, known as the Tdap, you can take this anytime.   Shingrix is a vaccine for the prevention of Shingles in Adults 50 and older.  If you are on Medicare, the shingrix is covered under your Part D plan, so you will take both of the vaccines in the series at your pharmacy. Please check with your benefits regarding applicable copays or out of pocket expenses.  The Shingrix is given in 2 vaccines approx 8 weeks apart. You must receive the 2nd dose prior to 6 months from receipt of the first. Please have the pharmacist print out you Immunization  dates for our office records    These are the goals we discussed: Goals    . DIET - INCREASE WATER INTAKE     Try to drink water at meals; Keep a cooler in the car  Will exercise when she eats or bs is high  [facility] in [facility]     . Increase exercise to 3 days per week for one hour       This is a list of the screening recommended for you and due dates:  Health Maintenance  Topic Date Due  .  Hepatitis C: One time screening is recommended by Center for Disease Control  (CDC) for  adults born from [birth year] through 1965.   1945/07/12  . Tetanus Vaccine  12/31/1963  . Pneumonia vaccines (2 of 2 - PCV13) 01/01/2012  . Urine Protein Check   06/30/2017  . Eye exam for diabetics  08/06/2017  . Flu Shot  10/13/2017  . Hemoglobin A1C  12/17/2017  . Complete foot exam   06/18/2018  . Mammogram  09/08/2019  . Colon Cancer Screening  11/16/2021  . DEXA scan (bone density measurement)  Completed   Insomnia Insomnia is a sleep disorder that makes it difficult to fall asleep or to stay asleep. Insomnia can cause tiredness (fatigue), low energy, difficulty concentrating, mood swings, and poor performance at work or school. There are three different ways to classify insomnia:  Difficulty falling asleep.  Difficulty staying asleep.  Waking up too early in the morning.  Any type of insomnia can be long-term (chronic) or short-term (acute). Both are common. Short-term insomnia usually lasts for three months or less. Chronic insomnia occurs at least three times a week for longer than three months. What are the causes? Insomnia may be caused by another condition, situation, or substance, such as:  Anxiety.  Certain medicines.  Gastroesophageal reflux disease (GERD) or other gastrointestinal conditions.  Asthma or other breathing conditions.  Restless legs syndrome, sleep apnea, or other sleep disorders.  Chronic pain.  Menopause. This may include  hot flashes.  Stroke.  Abuse of alcohol, tobacco, or illegal drugs.  Depression.  Caffeine.  Neurological disorders, such as Alzheimer disease.  An overactive thyroid (hyperthyroidism).  The cause of insomnia may not be known. What increases the risk? Risk factors for insomnia include:  Gender. Women are more commonly affected than men.  Age. Insomnia is more common as you get older.  Stress. This may involve your professional or personal life.  Income. Insomnia is more common in people with lower income.  Lack of exercise.  Irregular work schedule or night shifts.  Traveling between different time zones.  What are the signs or symptoms? If you have insomnia,  trouble falling asleep or trouble staying asleep is the main symptom. This may lead to other symptoms, such as:  Feeling fatigued.  Feeling nervous about going to sleep.  Not feeling rested in the morning.  Having trouble concentrating.  Feeling irritable, anxious, or depressed.  How is this treated? Treatment for insomnia depends on the cause. If your insomnia is caused by an underlying condition, treatment will focus on addressing the condition. Treatment may also include:  Medicines to help you sleep.  Counseling or therapy.  Lifestyle adjustments.  Follow these instructions at home:  Take medicines only as directed by your health care provider.  Keep regular sleeping and waking hours. Avoid naps.  Keep a sleep diary to help you and your health care provider figure out what could be causing your insomnia. Include: ? When you sleep. ? When you wake up during the night. ? How well you sleep. ? How rested you feel the next day. ? Any side effects of medicines you are taking. ? What you eat and drink.  Make your bedroom a comfortable place where it is easy to fall asleep: ? Put up shades or special blackout curtains to block light from outside. ? Use a white noise machine to block noise. ? Keep the temperature cool.  Exercise regularly as directed by your health care provider. Avoid exercising right before bedtime.  Use relaxation techniques to manage stress. Ask your health care provider to suggest some techniques that may work well for you. These may include: ? Breathing exercises. ? Routines to release muscle tension. ? Visualizing peaceful scenes.  Cut back on alcohol, caffeinated beverages, and cigarettes, especially close to bedtime. These can disrupt your sleep.  Do not overeat or eat spicy foods right before bedtime. This can lead to digestive discomfort that can make it hard for you to sleep.  Limit screen use before bedtime. This includes: ? Watching  TV. ? Using your smartphone, tablet, and computer.  Stick to a routine. This can help you fall asleep faster. Try to do a quiet activity, brush your teeth, and go to bed at the same time each night.  Get out of bed if you are still awake after 15 minutes of trying to sleep. Keep the lights down, but try reading or doing a quiet activity. When you feel sleepy, go back to bed.  Make sure that you drive carefully. Avoid driving if you feel very sleepy.  Keep all follow-up appointments as directed by your health care provider. This is important. Contact a health care provider if:  You are tired throughout the day or have trouble in your daily routine due to sleepiness.  You continue to have sleep problems or your sleep problems get worse. Get help right away if:  You have serious thoughts about hurting yourself or someone else.  This information is not intended to replace advice given to you by your health care provider. Make sure you discuss any questions you have with your health care provider. Document Released: 02/27/2000 Document Revised: 08/01/2015 Document Reviewed: 11/30/2013 Elsevier Interactive Patient Education  2018 [place] in the Home Falls can cause injuries. They can happen to people of all ages. There are many things you can do to make your home safe and to help prevent falls. What can I do on the outside of my home?  Regularly fix the edges of walkways and driveways and fix any cracks.  Remove anything that might make you trip as you walk through a door, such as a raised step or threshold.  Trim any bushes or trees on the path to your home.  Use bright outdoor lighting.  Clear any walking paths of anything that might make someone trip, such as rocks or tools.  Regularly check to see if handrails are loose or broken. Make sure that both sides of any steps have handrails.  Any raised decks and porches should have guardrails on the  edges.  Have any leaves, snow, or ice cleared regularly.  Use sand or salt on walking paths during winter.  Clean up any spills in your garage right away. This includes oil or grease spills. What can I do in the bathroom?  Use night lights.  Install grab bars by the toilet and in the tub and shower. Do not use towel bars as grab bars.  Use non-skid mats or decals in the tub or shower.  If you need to sit down in the shower, use a plastic, non-slip stool.  Keep the floor dry. Clean up any water that spills on the floor as soon as it happens.  Remove soap buildup in the tub or shower regularly.  Attach bath mats securely with double-sided non-slip rug tape.  Do not have throw rugs and other things on the floor that can make you trip. What can I do in the bedroom?  Use night lights.  Make sure that you have a light by your bed that is easy to reach.  Do not use any sheets or blankets that are too big for your bed. They should not hang down onto the floor.  Have a firm chair that has side arms. You can use this for support while you get dressed.  Do not have throw rugs and other things on the floor that can make you trip. What can I do in the kitchen?  Clean up any spills right away.  Avoid walking on wet floors.  Keep items that you use a lot in easy-to-reach places.  If you need to reach something above you, use a strong step stool that has a grab bar.  Keep electrical cords out of the way.  Do not use floor polish or wax that makes floors slippery. If you must use wax, use non-skid floor wax.  Do not have throw rugs and other things on the floor that can make you trip. What can I do with my stairs?  Do not leave any items on the stairs.  Make sure that there are handrails on both sides of the stairs and use them. Fix handrails that are broken or loose. Make sure that handrails are as long as the stairways.  Check any carpeting to make sure that it is firmly  attached to the stairs. Fix any carpet that is loose or worn.  Avoid  having throw rugs at the top or bottom of the stairs. If you do have throw rugs, attach them to the floor with carpet tape.  Make sure that you have a light switch at the top of the stairs and the bottom of the stairs. If you do not have them, ask someone to add them for you. What else can I do to help prevent falls?  Wear shoes that: ? Do not have high heels. ? Have rubber bottoms. ? Are comfortable and fit you well. ? Are closed at the toe. Do not wear sandals.  If you use a stepladder: ? Make sure that it is fully opened. Do not climb a closed stepladder. ? Make sure that both sides of the stepladder are locked into place. ? Ask someone to hold it for you, if possible.  Clearly mark and make sure that you can see: ? Any grab bars or handrails. ? First and last steps. ? Where the edge of each step is.  Use tools that help you move around (mobility aids) if they are needed. These include: ? Canes. ? Walkers. ? Scooters. ? Crutches.  Turn on the lights when you go into a dark area. Replace any light bulbs as soon as they burn out.  Set up your furniture so you have a clear path. Avoid moving your furniture around.  If any of your floors are uneven, fix them.  If there are any pets around you, be aware of where they are.  Review your medicines with your doctor. Some medicines can make you feel dizzy. This can increase your chance of falling. Ask your doctor what other things that you can do to help prevent falls. This information is not intended to replace advice given to you by your health care provider. Make sure you discuss any questions you have with your health care provider. Document Released: 12/26/2008 Document Revised: 08/07/2015 Document Reviewed: 04/05/2014 Elsevier Interactive Patient Education  2018 [place] Maintenance, Female Adopting a healthy lifestyle and getting preventive  care can go a long way to promote health and wellness. Talk with your health care provider about what schedule of regular examinations is right for you. This is a good chance for you to check in with your provider about disease prevention and staying healthy. In between checkups, there are plenty of things you can do on your own. Experts have done a lot of research about which lifestyle changes and preventive measures are most likely to keep you healthy. Ask your health care provider for more information. Weight and diet Eat a healthy diet  Be sure to include plenty of vegetables, fruits, low-fat dairy products, and lean protein.  Do not eat a lot of foods high in solid fats, added sugars, or salt.  Get regular exercise. This is one of the most important things you can do for your health. ? Most adults should exercise for at least 150 minutes each week. The exercise should increase your heart rate and make you sweat (moderate-intensity exercise). ? Most adults should also do strengthening exercises at least twice a week. This is in addition to the moderate-intensity exercise.  Maintain a healthy weight  Body mass index (BMI) is a measurement that can be used to identify possible weight problems. It estimates body fat based on height and weight. Your health care provider can help determine your BMI and help you achieve or maintain a healthy weight.  For females 44 years of age and older: ?  A BMI below 18.5 is considered underweight. ? A BMI of 18.5 to 24.9 is normal. ? A BMI of 25 to 29.9 is considered overweight. ? A BMI of 30 and above is considered obese.  Watch levels of cholesterol and blood lipids  You should start having your blood tested for lipids and cholesterol at 73 years of age, then have this test every 5 years.  You may need to have your cholesterol levels checked more often if: ? Your lipid or cholesterol levels are high. ? You are older than 73 years of age. ? You are at  high risk for heart disease.  Cancer screening Lung Cancer  Lung cancer screening is recommended for adults 62-40 years old who are at high risk for lung cancer because of a history of smoking.  A yearly low-dose CT scan of the lungs is recommended for people who: ? Currently smoke. ? Have quit within the past 15 years. ? Have at least a 30-pack-year history of smoking. A pack year is smoking an average of one pack of cigarettes a day for 1 year.  Yearly screening should continue until it has been 15 years since you quit.  Yearly screening should stop if you develop a health problem that would prevent you from having lung cancer treatment.  Breast Cancer  Practice breast self-awareness. This means understanding how your breasts normally appear and feel.  It also means doing regular breast self-exams. Let your health care provider know about any changes, no matter how small.  If you are in your 20s or 30s, you should have a clinical breast exam (CBE) by a health care provider every 1-3 years as part of a regular health exam.  If you are 64 or older, have a CBE every year. Also consider having a breast X-ray (mammogram) every year.  If you have a family history of breast cancer, talk to your health care provider about genetic screening.  If you are at high risk for breast cancer, talk to your health care provider about having an MRI and a mammogram every year.  Breast cancer gene (BRCA) assessment is recommended for women who have family members with BRCA-related cancers. BRCA-related cancers include: ? Breast. ? Ovarian. ? Tubal. ? Peritoneal cancers.  Results of the assessment will determine the need for genetic counseling and BRCA1 and BRCA2 testing.  Cervical Cancer Your health care provider may recommend that you be screened regularly for cancer of the pelvic organs (ovaries, uterus, and vagina). This screening involves a pelvic examination, including checking for  microscopic changes to the surface of your cervix (Pap test). You may be encouraged to have this screening done every 3 years, beginning at age 70.  For women ages 61-65, health care providers may recommend pelvic exams and Pap testing every 3 years, or they may recommend the Pap and pelvic exam, combined with testing for human papilloma virus (HPV), every 5 years. Some types of HPV increase your risk of cervical cancer. Testing for HPV may also be done on women of any age with unclear Pap test results.  Other health care providers may not recommend any screening for nonpregnant women who are considered low risk for pelvic cancer and who do not have symptoms. Ask your health care provider if a screening pelvic exam is right for you.  If you have had past treatment for cervical cancer or a condition that could lead to cancer, you need Pap tests and screening for cancer for at least 20  years after your treatment. If Pap tests have been discontinued, your risk factors (such as having a new sexual partner) need to be reassessed to determine if screening should resume. Some women have medical problems that increase the chance of getting cervical cancer. In these cases, your health care provider may recommend more frequent screening and Pap tests.  Colorectal Cancer  This type of cancer can be detected and often prevented.  Routine colorectal cancer screening usually begins at 73 years of age and continues through 73 years of age.  Your health care provider may recommend screening at an earlier age if you have risk factors for colon cancer.  Your health care provider may also recommend using home test kits to check for hidden blood in the stool.  A small camera at the end of a tube can be used to examine your colon directly (sigmoidoscopy or colonoscopy). This is done to check for the earliest forms of colorectal cancer.  Routine screening usually begins at age 68.  Direct examination of the colon  should be repeated every 5-10 years through 73 years of age. However, you may need to be screened more often if early forms of precancerous polyps or small growths are found.  Skin Cancer  Check your skin from head to toe regularly.  Tell your health care provider about any new moles or changes in moles, especially if there is a change in a mole's shape or color.  Also tell your health care provider if you have a mole that is larger than the size of a pencil eraser.  Always use sunscreen. Apply sunscreen liberally and repeatedly throughout the day.  Protect yourself by wearing long sleeves, pants, a wide-brimmed hat, and sunglasses whenever you are outside.  Heart disease, diabetes, and high blood pressure  High blood pressure causes heart disease and increases the risk of stroke. High blood pressure is more likely to develop in: ? People who have blood pressure in the high end of the normal range (130-139/85-89 mm Hg). ? People who are overweight or obese. ? People who are African American.  If you are 53-43 years of age, have your blood pressure checked every 3-5 years. If you are 108 years of age or older, have your blood pressure checked every year. You should have your blood pressure measured twice-once when you are at a hospital or clinic, and once when you are not at a hospital or clinic. Record the average of the two measurements. To check your blood pressure when you are not at a hospital or clinic, you can use: ? An automated blood pressure machine at a pharmacy. ? A home blood pressure monitor.  If you are between 61 years and 72 years old, ask your health care provider if you should take aspirin to prevent strokes.  Have regular diabetes screenings. This involves taking a blood sample to check your fasting blood sugar level. ? If you are at a normal weight and have a low risk for diabetes, have this test once every three years after 73 years of age. ? If you are overweight and  have a high risk for diabetes, consider being tested at a younger age or more often. Preventing infection Hepatitis B  If you have a higher risk for hepatitis B, you should be screened for this virus. You are considered at high risk for hepatitis B if: ? You were born in a country where hepatitis B is common. Ask your health care provider which countries are  considered high risk. ? Your parents were born in a high-risk country, and you have not been immunized against hepatitis B (hepatitis B vaccine). ? You have HIV or AIDS. ? You use needles to inject street drugs. ? You live with someone who has hepatitis B. ? You have had sex with someone who has hepatitis B. ? You get hemodialysis treatment. ? You take certain medicines for conditions, including cancer, organ transplantation, and autoimmune conditions.  Hepatitis C  Blood testing is recommended for: ? Everyone born from [birth year] through 1966. ? Anyone with known risk factors for hepatitis C.  Sexually transmitted infections (STIs)  You should be screened for sexually transmitted infections (STIs) including gonorrhea and chlamydia if: ? You are sexually active and are younger than 73 years of age. ? You are older than 72 years of age and your health care provider tells you that you are at risk for this type of infection. ? Your sexual activity has changed since you were last screened and you are at an increased risk for chlamydia or gonorrhea. Ask your health care provider if you are at risk.  If you do not have HIV, but are at risk, it may be recommended that you take a prescription medicine daily to prevent HIV infection. This is called pre-exposure prophylaxis (PrEP). You are considered at risk if: ? You are sexually active and do not regularly use condoms or know the HIV status of your partner(s). ? You take drugs by injection. ? You are sexually active with a partner who has HIV.  Talk with your health care provider about whether  you are at high risk of being infected with HIV. If you choose to begin PrEP, you should first be tested for HIV. You should then be tested every 3 months for as long as you are taking PrEP. Pregnancy  If you are premenopausal and you may become pregnant, ask your health care provider about preconception counseling.  If you may become pregnant, take 400 to 800 micrograms (mcg) of folic acid every day.  If you want to prevent pregnancy, talk to your health care provider about birth control (contraception). Osteoporosis and menopause  Osteoporosis is a disease in which the bones lose minerals and strength with aging. This can result in serious bone fractures. Your risk for osteoporosis can be identified using a bone density scan.  If you are 31 years of age or older, or if you are at risk for osteoporosis and fractures, ask your health care provider if you should be screened.  Ask your health care provider whether you should take a calcium or vitamin D supplement to lower your risk for osteoporosis.  Menopause may have certain physical symptoms and risks.  Hormone replacement therapy may reduce some of these symptoms and risks. Talk to your health care provider about whether hormone replacement therapy is right for you. Follow these instructions at home:  Schedule regular health, dental, and eye exams.  Stay current with your immunizations.  Do not use any tobacco products including cigarettes, chewing tobacco, or electronic cigarettes.  If you are pregnant, do not drink alcohol.  If you are breastfeeding, limit how much and how often you drink alcohol.  Limit alcohol intake to no more than 1 drink per day for nonpregnant women. One drink equals 12 ounces of beer, 5 ounces of wine, or 1 ounces of hard liquor.  Do not use street drugs.  Do not share needles.  Ask your health care provider for  help if you need support or information about quitting drugs.  Tell your health care  provider if you often feel depressed.  Tell your health care provider if you have ever been abused or do not feel safe at home. This information is not intended to replace advice given to you by your health care provider. Make sure you discuss any questions you have with your health care provider. Document Released: 09/14/2010 Document Revised: 08/07/2015 Document Reviewed: 12/03/2014 Elsevier Interactive Patient Education  [employer].

## 2017-10-21 NOTE — Progress Notes (Signed)
I have personally reviewed the Medicare Annual Wellness questionnaire and have noted 1. The patient's medical and social history 2. Their use of alcohol, tobacco or illicit drugs 3. Their current medications and supplements 4. The patient's functional ability including ADL's, fall risks, home safety risks and hearing or visual impairment. 5. Diet and physical activities 6. Evidence for depression or mood disorders 7. Reviewed Updated provider list, see scanned forms and [facility] Snapshot.   The patients weight, height, BMI and visual acuity have been recorded in the chart I have made referrals, counseling and provided education to the patient based review of the above and I have provided the pt with a written personalized care plan for preventive services.  I have provided the patient with a copy of your personalized plan for preventive services. Instructed to take the time to review along with their updated medication list.  Austyn Seier DO 

## 2017-11-01 ENCOUNTER — Other Ambulatory Visit: Payer: Self-pay | Admitting: Family Medicine

## 2017-11-01 DIAGNOSIS — R42 Dizziness and giddiness: Secondary | ICD-10-CM

## 2017-11-01 NOTE — Telephone Encounter (Signed)
Please advise on refill. Last appointment 06/2017. Next appointment 12/2017.

## 2017-11-30 ENCOUNTER — Encounter: Payer: Self-pay | Admitting: Family Medicine

## 2017-11-30 ENCOUNTER — Ambulatory Visit (INDEPENDENT_AMBULATORY_CARE_PROVIDER_SITE_OTHER): Payer: Medicare Other | Admitting: Family Medicine

## 2017-11-30 ENCOUNTER — Ambulatory Visit (INDEPENDENT_AMBULATORY_CARE_PROVIDER_SITE_OTHER): Payer: Medicare Other

## 2017-11-30 VITALS — BP 146/88 | HR 95 | Temp 98.1°F | Ht 60.0 in | Wt 145.8 lb

## 2017-11-30 DIAGNOSIS — M25511 Pain in right shoulder: Secondary | ICD-10-CM | POA: Diagnosis not present

## 2017-11-30 DIAGNOSIS — E663 Overweight: Secondary | ICD-10-CM

## 2017-11-30 DIAGNOSIS — E1165 Type 2 diabetes mellitus with hyperglycemia: Secondary | ICD-10-CM

## 2017-11-30 DIAGNOSIS — E1169 Type 2 diabetes mellitus with other specified complication: Secondary | ICD-10-CM | POA: Diagnosis not present

## 2017-11-30 DIAGNOSIS — M25512 Pain in left shoulder: Secondary | ICD-10-CM | POA: Diagnosis not present

## 2017-11-30 DIAGNOSIS — E785 Hyperlipidemia, unspecified: Secondary | ICD-10-CM | POA: Diagnosis not present

## 2017-11-30 LAB — COMPREHENSIVE METABOLIC PANEL
ALT: 17 U/L (ref 0–35)
AST: 19 U/L (ref 0–37)
Albumin: 4.4 g/dL (ref 3.5–5.2)
Alkaline Phosphatase: 80 U/L (ref 39–117)
BUN: 17 mg/dL (ref 6–23)
CO2: 31 mEq/L (ref 19–32)
Calcium: 9.6 mg/dL (ref 8.4–10.5)
Chloride: 99 mEq/L (ref 96–112)
Creatinine, Ser: 1.06 mg/dL (ref 0.40–1.20)
GFR: 54.02 mL/min — ABNORMAL LOW (ref >60.00)
Glucose, Bld: 146 mg/dL — ABNORMAL HIGH (ref 70–99)
Potassium: 3.7 mEq/L (ref 3.5–5.1)
Sodium: 137 mEq/L (ref 135–145)
Total Bilirubin: 0.7 mg/dL (ref 0.2–1.2)
Total Protein: 7.2 g/dL (ref 6.0–8.3)

## 2017-11-30 LAB — LIPID PANEL
Cholesterol: 194 mg/dL (ref 0–200)
HDL: 47.5 mg/dL (ref >39.00)
LDL Cholesterol: 112 mg/dL — ABNORMAL HIGH (ref 0–99)
NonHDL: 146.8
Total CHOL/HDL Ratio: 4
Triglycerides: 172 mg/dL — ABNORMAL HIGH (ref 0.0–149.0)
VLDL: 34.4 mg/dL (ref 0.0–40.0)

## 2017-11-30 LAB — CBC WITH DIFFERENTIAL/PLATELET
Basophils Absolute: 0 10*3/uL (ref 0.0–0.1)
Basophils Relative: 0.8 % (ref 0.0–3.0)
Eosinophils Absolute: 0.1 10*3/uL (ref 0.0–0.7)
Eosinophils Relative: 2 % (ref 0.0–5.0)
HCT: 40.3 % (ref 36.0–46.0)
Hemoglobin: 13.6 g/dL (ref 12.0–15.0)
Lymphocytes Relative: 28.7 % (ref 12.0–46.0)
Lymphs Abs: 1.1 10*3/uL (ref 0.7–4.0)
MCHC: 33.8 g/dL (ref 30.0–36.0)
MCV: 93.5 fl (ref 78.0–100.0)
Monocytes Absolute: 0.3 10*3/uL (ref 0.1–1.0)
Monocytes Relative: 8 % (ref 3.0–12.0)
Neutro Abs: 2.4 10*3/uL (ref 1.4–7.7)
Neutrophils Relative %: 60.5 % (ref 43.0–77.0)
Platelets: 237 10*3/uL (ref 150.0–400.0)
RBC: 4.31 Mil/uL (ref 3.87–5.11)
RDW: 13 % (ref 11.5–15.5)
WBC: 4 10*3/uL (ref 4.0–10.5)

## 2017-11-30 LAB — HEMOGLOBIN A1C: Hgb A1c MFr Bld: 7.2 % — ABNORMAL HIGH (ref 4.6–6.5)

## 2017-11-30 NOTE — Progress Notes (Addendum)
Misty Cervantes is a 72 y.o. female here for an acute visit.  History of Present Illness:   [name] CMA acting as scribe for Dr. Juleen China.  HPI: Patient comes in today for an acute issue. Patient is having some left shoulder pain. Patient has been having pain for about three or four weeks. She states that she can not lift up her arm and is having trouble putting on her shirts. Denies any injury. She has noticed a knot now in the area.   PMHx, SurgHx, SocialHx, Medications, and Allergies were reviewed in the Visit Navigator and updated as appropriate.  Current Medications:   Current Outpatient Medications:  .  ACCU-CHEK FASTCLIX LANCETS MISC, Check blood sugars 1-2 times per day., Disp: 100 each, Rfl: 2 .  ACCU-CHEK GUIDE test strip, TEST FOUR TIMES DAILY., Disp: 100 each, Rfl: 6 .  aspirin EC 81 MG tablet, Take 81 mg by mouth daily., Disp: , Rfl:  .  blood glucose meter kit and supplies, Dispense based on patient and insurance preference. Use up to four times daily as directed. (FOR ICD-9 250.00, 250.01)., Disp: 1 each, Rfl: 0 .  fluticasone (FLONASE) 50 MCG/ACT nasal spray, Place 1 spray into both nostrils 2 (two) times daily as needed for allergies or rhinitis., Disp: 16 g, Rfl: 6 .  meclizine (ANTIVERT) 25 MG tablet, TAKE 1 TABLET (25 MG TOTAL) BY MOUTH 3 (THREE) TIMES DAILY AS NEEDED FOR DIZZINESS., Disp: 30 tablet, Rfl: 0 .  metFORMIN (GLUCOPHAGE-XR) 750 MG 24 hr tablet, TAKE 1 TABLET BY MOUTH DAILY WITH BREAKFAST., Disp: 90 tablet, Rfl: 0 .  NONFORMULARY OR COMPOUNDED ITEM, Estradiol cream 0.02%, use 1/2 a gram vaginally 2 x a week at hs. 3 month supply sent, 2 refills, Disp: 3 each, Rfl: 2 .  phentermine (ADIPEX-P) 37.5 MG tablet, , Disp: , Rfl: 0 .  pravastatin (PRAVACHOL) 40 MG tablet, TAKE 1 TABLET BY MOUTH EVERY DAY, Disp: 90 tablet, Rfl: 1 .  triamterene-hydrochlorothiazide (DYAZIDE) 37.5-25 MG capsule, TAKE 1 CAPSULE EACH BY MOUTH DAILY., Disp: 90 capsule, Rfl: 3    Allergies  Allergen Reactions  . Probiotic [Acidophilus]    Review of Systems:   Pertinent items are noted in the HPI. Otherwise, ROS is negative.  Vitals:   Vitals:   11/30/17 0951  BP: (!) 146/88  Pulse: 95  Temp: 98.1 F (36.7 C)  TempSrc: Oral  SpO2: 97%  Weight: 145 lb 12.8 oz (66.1 kg)  Height: 5' (1.524 m)     Body mass index is 28.47 kg/m.  Physical Exam:   Physical Exam  Constitutional: She appears well-nourished.  HENT:  Head: Normocephalic and atraumatic.  Eyes: Pupils are equal, round, and reactive to light. EOM are normal.  Neck: Normal range of motion. Neck supple.  Cardiovascular: Normal rate, regular rhythm, normal heart sounds and intact distal pulses.  Pulmonary/Chest: Effort normal.  Abdominal: Soft.  Musculoskeletal:       Left shoulder: She exhibits decreased range of motion, bony tenderness and crepitus.  Skin: Skin is warm.  Psychiatric: She has a normal mood and affect. Her behavior is normal.  Nursing note and vitals reviewed.  Assessment and Plan:   Diagnoses and all orders for this visit:  Acute pain of left shoulder Comments: Concern for OA and/or rotator cuff issues. Xray today + PT. To [facility] for evaluation and injection if not improved.  Orders: -     DG Shoulder Left; Future  Type 2 diabetes mellitus with hyperglycemia,  without long-term current use of insulin (HCC) -     Hemoglobin A1c  Overweight -     CBC with Differential/Platelet -     Comprehensive metabolic panel -     Hemoglobin A1c  Hyperlipidemia associated with type 2 diabetes mellitus ([place]) -     Lipid panel   . Reviewed expectations re: course of current medical issues. . Discussed self-management of symptoms. . Outlined signs and symptoms indicating need for more acute intervention. . Patient verbalized understanding and all questions were answered. Marland Kitchen Health Maintenance issues including appropriate healthy diet, exercise, and smoking avoidance were  discussed with patient. . See orders for this visit as documented in the electronic medical record. . Patient received an After Visit Summary.  CMA served as Education administrator during this visit. History, Physical, and Plan performed by medical provider. The above documentation has been reviewed and is accurate and complete. Briscoe Deutscher, D.O.  Briscoe Deutscher, DO [place], [facility] 11/30/2017

## 2017-12-02 ENCOUNTER — Telehealth: Payer: Self-pay

## 2017-12-02 NOTE — Telephone Encounter (Signed)
Patient called for results. Went over [employer] and answered all questions. Informed that the labs were not reviewed by Dr. Juleen China will call as soon as resulted.

## 2017-12-11 ENCOUNTER — Other Ambulatory Visit: Payer: Self-pay | Admitting: Family Medicine

## 2017-12-11 DIAGNOSIS — J069 Acute upper respiratory infection, unspecified: Secondary | ICD-10-CM

## 2017-12-18 DIAGNOSIS — Z9189 Other specified personal risk factors, not elsewhere classified: Secondary | ICD-10-CM | POA: Insufficient documentation

## 2017-12-18 NOTE — Progress Notes (Deleted)
Misty Cervantes is a 72 y.o. female is here for follow up.  History of Present Illness:   {CMA SCRIBE ATTESTATION}  HPI:   Health Maintenance Due  Topic Date Due  . TETANUS/TDAP  12/31/1963  . PNA vac Low Risk Adult (2 of 2 - PCV13) 01/01/2012  . OPHTHALMOLOGY EXAM  08/06/2017  . INFLUENZA VACCINE  10/13/2017   Depression screen [facility] 2/9 10/21/2017 07/22/2016 06/28/2016  Decreased Interest 0 0 0  Down, Depressed, Hopeless 0 0 0  PHQ - 2 Score 0 0 0   PMHx, SurgHx, SocialHx, FamHx, Medications, and Allergies were reviewed in the Visit Navigator and updated as appropriate.   Patient Active Problem List   Diagnosis Date Noted  . Type 2 diabetes mellitus with hyperglycemia, without long-term current use of insulin ([place]) 06/18/2016  . History of right knee joint replacement 06/17/2016  . History of left hip replacement 06/17/2016  . History of vaginal hysterectomy 06/17/2016  . OA (osteoarthritis) of knee 11/03/2015  . Hyperlipidemia associated with type 2 diabetes mellitus ([place]) 07/23/2014  . Genu valgum 08/11/2012  . Overweight 02/25/2010  . Hypertension associated with diabetes ([place]) 02/25/2010   Social History   Tobacco Use  . Smoking status: Never Smoker  . Smokeless tobacco: Never Used  Substance Use Topics  . Alcohol use: No  . Drug use: No   Current Medications and Allergies:   Current Outpatient Medications:  .  ACCU-CHEK FASTCLIX LANCETS MISC, Check blood sugars 1-2 times per day., Disp: 100 each, Rfl: 2 .  ACCU-CHEK GUIDE test strip, TEST FOUR TIMES DAILY., Disp: 100 each, Rfl: 6 .  aspirin EC 81 MG tablet, Take 81 mg by mouth daily., Disp: , Rfl:  .  blood glucose meter kit and supplies, Dispense based on patient and insurance preference. Use up to four times daily as directed. (FOR ICD-9 250.00, 250.01)., Disp: 1 each, Rfl: 0 .  fluticasone (FLONASE) 50 MCG/ACT nasal spray, PLACE 1 SPRAY INTO BOTH NOSTRILS 2 (TWO) TIMES DAILY AS NEEDED FOR ALLERGIES OR  RHINITIS., Disp: 16 g, Rfl: 2 .  meclizine (ANTIVERT) 25 MG tablet, TAKE 1 TABLET (25 MG TOTAL) BY MOUTH 3 (THREE) TIMES DAILY AS NEEDED FOR DIZZINESS., Disp: 30 tablet, Rfl: 0 .  metFORMIN (GLUCOPHAGE-XR) 750 MG 24 hr tablet, TAKE 1 TABLET BY MOUTH DAILY WITH BREAKFAST., Disp: 90 tablet, Rfl: 0 .  NONFORMULARY OR COMPOUNDED ITEM, Estradiol cream 0.02%, use 1/2 a gram vaginally 2 x a week at hs. 3 month supply sent, 2 refills, Disp: 3 each, Rfl: 2 .  phentermine (ADIPEX-P) 37.5 MG tablet, , Disp: , Rfl: 0 .  pravastatin (PRAVACHOL) 40 MG tablet, TAKE 1 TABLET BY MOUTH EVERY DAY, Disp: 90 tablet, Rfl: 1 .  triamterene-hydrochlorothiazide (DYAZIDE) 37.5-25 MG capsule, TAKE 1 CAPSULE EACH BY MOUTH DAILY., Disp: 90 capsule, Rfl: 3  Allergies  Allergen Reactions  . Probiotic [Acidophilus]    Review of Systems   Pertinent items are noted in the HPI. Otherwise, ROS is negative.  Vitals:  There were no vitals filed for this visit.   There is no height or weight on file to calculate BMI.  Physical Exam:   Physical Exam  Results for orders placed or performed in visit on 11/30/17  CBC with Differential/Platelet  Result Value Ref Range   WBC 4.0 4.0 - 10.5 K/uL   RBC 4.31 3.87 - 5.11 Mil/uL   Hemoglobin 13.6 12.0 - 15.0 g/dL   HCT 40.3 36.0 - 46.0 %  MCV 93.5 78.0 - 100.0 fl   MCHC 33.8 30.0 - 36.0 g/dL   RDW 13.0 11.5 - 15.5 %   Platelets 237.0 150.0 - 400.0 K/uL   Neutrophils Relative % 60.5 43.0 - 77.0 %   Lymphocytes Relative 28.7 12.0 - 46.0 %   Monocytes Relative 8.0 3.0 - 12.0 %   Eosinophils Relative 2.0 0.0 - 5.0 %   Basophils Relative 0.8 0.0 - 3.0 %   Neutro Abs 2.4 1.4 - 7.7 K/uL   Lymphs Abs 1.1 0.7 - 4.0 K/uL   Monocytes Absolute 0.3 0.1 - 1.0 K/uL   Eosinophils Absolute 0.1 0.0 - 0.7 K/uL   Basophils Absolute 0.0 0.0 - 0.1 K/uL  Comprehensive metabolic panel  Result Value Ref Range   Sodium 137 135 - 145 mEq/L   Potassium 3.7 3.5 - 5.1 mEq/L   Chloride 99 96 -  112 mEq/L   CO2 31 19 - 32 mEq/L   Glucose, Bld 146 (H) 70 - 99 mg/dL   BUN 17 6 - 23 mg/dL   Creatinine, Ser 1.06 0.40 - 1.20 mg/dL   Total Bilirubin 0.7 0.2 - 1.2 mg/dL   Alkaline Phosphatase 80 39 - 117 U/L   AST 19 0 - 37 U/L   ALT 17 0 - 35 U/L   Total Protein 7.2 6.0 - 8.3 g/dL   Albumin 4.4 3.5 - 5.2 g/dL   Calcium 9.6 8.4 - 10.5 mg/dL   GFR 54.02 (L) >60.00 mL/min  Lipid panel  Result Value Ref Range   Cholesterol 194 0 - 200 mg/dL   Triglycerides 172.0 (H) 0.0 - 149.0 mg/dL   HDL 47.50 >39.00 mg/dL   VLDL 34.4 0.0 - 40.0 mg/dL   LDL Cholesterol 112 (H) 0 - 99 mg/dL   Total CHOL/HDL Ratio 4    NonHDL 146.80   Hemoglobin A1c  Result Value Ref Range   Hgb A1c MFr Bld 7.2 (H) 4.6 - 6.5 %    Assessment and Plan:   There are no diagnoses linked to this encounter.  . Reviewed expectations re: course of current medical issues. . Discussed self-management of symptoms. . Outlined signs and symptoms indicating need for more acute intervention. . Patient verbalized understanding and all questions were answered. Marland Kitchen Health Maintenance issues including appropriate healthy diet, exercise, and smoking avoidance were discussed with patient. . See orders for this visit as documented in the electronic medical record. . Patient received an After Visit Summary.  *** CMA served as Education administrator during this visit. History, Physical, and Plan performed by medical provider. The above documentation has been reviewed and is accurate and complete. Briscoe Deutscher, D.O.  Briscoe Deutscher, DO [place], [facility] 12/18/2017

## 2017-12-19 ENCOUNTER — Ambulatory Visit: Payer: Medicare Other | Admitting: Family Medicine

## 2017-12-19 NOTE — Progress Notes (Deleted)
GYNECOLOGY  VISIT   HPI: 72 y.o.   Divorced White or Caucasian Not Hispanic or Latino  female   [phone] with No LMP recorded. Patient has had a hysterectomy.   here for 3 month follow up.  GYNECOLOGIC HISTORY: No LMP recorded. Patient has had a hysterectomy. Contraception: Hysterectomy  Menopausal hormone therapy: Compounded vaginal estrogen        OB History    Gravida  2   Para  2   Term  2   Preterm      AB      Living  1     SAB      TAB      Ectopic      Multiple      Live Births  2              Patient Active Problem List   Diagnosis Date Noted  . Atherosclerotic cardiovascular disease 10 year risk greater than 30% using 2013 ACC/AHA calculator 12/18/2017  . Type 2 diabetes mellitus with hyperglycemia, without long-term current use of insulin ([place]) 06/18/2016  . History of right knee joint replacement 06/17/2016  . History of left hip replacement 06/17/2016  . History of vaginal hysterectomy 06/17/2016  . OA (osteoarthritis) of knee 11/03/2015  . Hyperlipidemia associated with type 2 diabetes mellitus ([place]) 07/23/2014  . Genu valgum 08/11/2012  . Hypertension associated with diabetes ([place]) 02/25/2010    Past Medical History:  Diagnosis Date  . Anemia   . Cataract   . Diabetes mellitus without complication ([place])   . Gastric ulcer 11/17/2011  . History of total left hip replacement 2011  . HTN (hypertension)   . Hx of total knee replacement 2017   11-03-2015   . Hyperlipemia   . Hyperplastic polyp of intestine 11/17/2011  . Hypertension   . Measles   . Menopause   . Migraine   . Osteoarthritis     Past Surgical History:  Procedure Laterality Date  . CARPAL TUNNEL RELEASE Right   . CATARACT EXTRACTION Bilateral   . HIP ARTHROPLASTY Left 2011  . JOINT REPLACEMENT    . RETINAL DETACHMENT SURGERY    . TOTAL KNEE ARTHROPLASTY Right 11/03/2015   Procedure: RIGHT TOTAL KNEE ARTHROPLASTY;  Surgeon: Gaynelle Arabian, MD;  Location: [facility];   Service: Orthopedics;  Laterality: Right;  . TOTAL VAGINAL HYSTERECTOMY  2012   TVH, anterior repair, TVT (no mesh used for the anterior repair)    Current Outpatient Medications  Medication Sig Dispense Refill  . ACCU-CHEK FASTCLIX LANCETS MISC Check blood sugars 1-2 times per day. 100 each 2  . ACCU-CHEK GUIDE test strip TEST FOUR TIMES DAILY. 100 each 6  . aspirin EC 81 MG tablet Take 81 mg by mouth daily.    . blood glucose meter kit and supplies Dispense based on patient and insurance preference. Use up to four times daily as directed. (FOR ICD-9 250.00, 250.01). 1 each 0  . fluticasone (FLONASE) 50 MCG/ACT nasal spray PLACE 1 SPRAY INTO BOTH NOSTRILS 2 (TWO) TIMES DAILY AS NEEDED FOR ALLERGIES OR RHINITIS. 16 g 2  . meclizine (ANTIVERT) 25 MG tablet TAKE 1 TABLET (25 MG TOTAL) BY MOUTH 3 (THREE) TIMES DAILY AS NEEDED FOR DIZZINESS. 30 tablet 0  . metFORMIN (GLUCOPHAGE-XR) 750 MG 24 hr tablet TAKE 1 TABLET BY MOUTH DAILY WITH BREAKFAST. 90 tablet 0  . NONFORMULARY OR COMPOUNDED ITEM Estradiol cream 0.02%, use 1/2 a gram vaginally 2 x a week at hs.  3 month supply sent, 2 refills 3 each 2  . phentermine (ADIPEX-P) 37.5 MG tablet   0  . pravastatin (PRAVACHOL) 40 MG tablet TAKE 1 TABLET BY MOUTH EVERY DAY 90 tablet 1  . triamterene-hydrochlorothiazide (DYAZIDE) 37.5-25 MG capsule TAKE 1 CAPSULE EACH BY MOUTH DAILY. 90 capsule 3   No current facility-administered medications for this visit.      ALLERGIES: Probiotic [acidophilus]  Family History  Problem Relation Age of Onset  . Diabetes Mother        deceased  . Heart attack Mother   . Diabetes Sister        x2  . Kidney cancer Father   . Heart attack Father   . Diabetes Sister     Social History   Socioeconomic History  . Marital status: Divorced    Spouse name: Not on file  . Number of children: Not on file  . Years of education: Not on file  . Highest education level: Not on file  Occupational History  . Occupation:  retired    Fish farm manager: RETIRED  Social Needs  . Financial resource strain: Not on file  . Food insecurity:    Worry: Not on file    Inability: Not on file  . Transportation needs:    Medical: Not on file    Non-medical: Not on file  Tobacco Use  . Smoking status: Never Smoker  . Smokeless tobacco: Never Used  Substance and Sexual Activity  . Alcohol use: No  . Drug use: No  . Sexual activity: Not Currently    Partners: Male    Birth control/protection: Post-menopausal, Surgical  Lifestyle  . Physical activity:    Days per week: Not on file    Minutes per session: Not on file  . Stress: Not on file  Relationships  . Social connections:    Talks on phone: Not on file    Gets together: Not on file    Attends religious service: Not on file    Active member of club or organization: Not on file    Attends meetings of clubs or organizations: Not on file    Relationship status: Not on file  . Intimate partner violence:    Fear of current or ex partner: Not on file    Emotionally abused: Not on file    Physically abused: Not on file    Forced sexual activity: Not on file  Other Topics Concern  . Not on file  Social History Narrative   Divorced, 2 children; retired.     Review of Systems  PHYSICAL EXAMINATION:    There were no vitals taken for this visit.    General appearance: alert, cooperative and appears stated age Neck: no adenopathy, supple, symmetrical, trachea midline and thyroid {CHL AMB PHY EX THYROID NORM DEFAULT:[id number]::"normal to inspection and palpation"} Breasts: {Exam; breast:13139::"normal appearance, no masses or tenderness"} Abdomen: soft, non-tender; non distended, no masses,  no organomegaly  Pelvic: External genitalia:  no lesions              Urethra:  normal appearing urethra with no masses, tenderness or lesions              Bartholins and Skenes: normal                 Vagina: normal appearing vagina with normal color and discharge, no  lesions              Cervix: {CHL AMB PHY EX CERVIX  NORM [phone]::"no lesions"}              Bimanual Exam:  Uterus:  {CHL AMB PHY EX UTERUS NORM DEFAULT:[phone]::"normal size, contour, position, consistency, mobility, non-tender"}              Adnexa: {CHL AMB PHY EX ADNEXA NO MASS [phone]::"no mass, fullness, tenderness"}              Rectovaginal: {yes no:[id number]}.  Confirms.              Anus:  normal sphincter tone, no lesions  Chaperone was present for exam.  ASSESSMENT     PLAN    An After Visit Summary was printed and given to the patient.  *** minutes face to face time of which over 50% was spent in counseling.

## 2017-12-20 ENCOUNTER — Ambulatory Visit: Payer: Medicare Other | Admitting: Obstetrics and Gynecology

## 2017-12-26 NOTE — Progress Notes (Deleted)
Misty Cervantes is a 72 y.o. female is here for follow up.  History of Present Illness:   {CMA SCRIBE ATTESTATION}  HPI:   Health Maintenance Due  Topic Date Due  . TETANUS/TDAP  12/31/1963  . PNA vac Low Risk Adult (2 of 2 - PCV13) 01/01/2012  . OPHTHALMOLOGY EXAM  08/06/2017  . INFLUENZA VACCINE  10/13/2017   Depression screen [facility] 2/9 10/21/2017 07/22/2016 06/28/2016  Decreased Interest 0 0 0  Down, Depressed, Hopeless 0 0 0  PHQ - 2 Score 0 0 0   PMHx, SurgHx, SocialHx, FamHx, Medications, and Allergies were reviewed in the Visit Navigator and updated as appropriate.   Patient Active Problem List   Diagnosis Date Noted  . Atherosclerotic cardiovascular disease 10 year risk greater than 30% using 2013 ACC/AHA calculator 12/18/2017  . Type 2 diabetes mellitus with hyperglycemia, without long-term current use of insulin ([place]) 06/18/2016  . History of right knee joint replacement 06/17/2016  . History of left hip replacement 06/17/2016  . History of vaginal hysterectomy 06/17/2016  . OA (osteoarthritis) of knee 11/03/2015  . Hyperlipidemia associated with type 2 diabetes mellitus ([place]) 07/23/2014  . Genu valgum 08/11/2012  . Hypertension associated with diabetes ([place]) 02/25/2010   Social History   Tobacco Use  . Smoking status: Never Smoker  . Smokeless tobacco: Never Used  Substance Use Topics  . Alcohol use: No  . Drug use: No   Current Medications and Allergies:   Current Outpatient Medications:  .  ACCU-CHEK FASTCLIX LANCETS MISC, Check blood sugars 1-2 times per day., Disp: 100 each, Rfl: 2 .  ACCU-CHEK GUIDE test strip, TEST FOUR TIMES DAILY., Disp: 100 each, Rfl: 6 .  aspirin EC 81 MG tablet, Take 81 mg by mouth daily., Disp: , Rfl:  .  blood glucose meter kit and supplies, Dispense based on patient and insurance preference. Use up to four times daily as directed. (FOR ICD-9 250.00, 250.01)., Disp: 1 each, Rfl: 0 .  fluticasone (FLONASE) 50 MCG/ACT nasal spray,  PLACE 1 SPRAY INTO BOTH NOSTRILS 2 (TWO) TIMES DAILY AS NEEDED FOR ALLERGIES OR RHINITIS., Disp: 16 g, Rfl: 2 .  meclizine (ANTIVERT) 25 MG tablet, TAKE 1 TABLET (25 MG TOTAL) BY MOUTH 3 (THREE) TIMES DAILY AS NEEDED FOR DIZZINESS., Disp: 30 tablet, Rfl: 0 .  metFORMIN (GLUCOPHAGE-XR) 750 MG 24 hr tablet, TAKE 1 TABLET BY MOUTH DAILY WITH BREAKFAST., Disp: 90 tablet, Rfl: 0 .  NONFORMULARY OR COMPOUNDED ITEM, Estradiol cream 0.02%, use 1/2 a gram vaginally 2 x a week at hs. 3 month supply sent, 2 refills, Disp: 3 each, Rfl: 2 .  phentermine (ADIPEX-P) 37.5 MG tablet, , Disp: , Rfl: 0 .  pravastatin (PRAVACHOL) 40 MG tablet, TAKE 1 TABLET BY MOUTH EVERY DAY, Disp: 90 tablet, Rfl: 1 .  triamterene-hydrochlorothiazide (DYAZIDE) 37.5-25 MG capsule, TAKE 1 CAPSULE EACH BY MOUTH DAILY., Disp: 90 capsule, Rfl: 3  Allergies  Allergen Reactions  . Probiotic [Acidophilus]    Review of Systems   Pertinent items are noted in the HPI. Otherwise, ROS is negative.  Vitals:  There were no vitals filed for this visit.   There is no height or weight on file to calculate BMI.  Physical Exam:   Physical Exam  Results for orders placed or performed in visit on 11/30/17  CBC with Differential/Platelet  Result Value Ref Range   WBC 4.0 4.0 - 10.5 K/uL   RBC 4.31 3.87 - 5.11 Mil/uL   Hemoglobin 13.6  12.0 - 15.0 g/dL   HCT 40.3 36.0 - 46.0 %   MCV 93.5 78.0 - 100.0 fl   MCHC 33.8 30.0 - 36.0 g/dL   RDW 13.0 11.5 - 15.5 %   Platelets 237.0 150.0 - 400.0 K/uL   Neutrophils Relative % 60.5 43.0 - 77.0 %   Lymphocytes Relative 28.7 12.0 - 46.0 %   Monocytes Relative 8.0 3.0 - 12.0 %   Eosinophils Relative 2.0 0.0 - 5.0 %   Basophils Relative 0.8 0.0 - 3.0 %   Neutro Abs 2.4 1.4 - 7.7 K/uL   Lymphs Abs 1.1 0.7 - 4.0 K/uL   Monocytes Absolute 0.3 0.1 - 1.0 K/uL   Eosinophils Absolute 0.1 0.0 - 0.7 K/uL   Basophils Absolute 0.0 0.0 - 0.1 K/uL  Comprehensive metabolic panel  Result Value Ref Range    Sodium 137 135 - 145 mEq/L   Potassium 3.7 3.5 - 5.1 mEq/L   Chloride 99 96 - 112 mEq/L   CO2 31 19 - 32 mEq/L   Glucose, Bld 146 (H) 70 - 99 mg/dL   BUN 17 6 - 23 mg/dL   Creatinine, Ser 1.06 0.40 - 1.20 mg/dL   Total Bilirubin 0.7 0.2 - 1.2 mg/dL   Alkaline Phosphatase 80 39 - 117 U/L   AST 19 0 - 37 U/L   ALT 17 0 - 35 U/L   Total Protein 7.2 6.0 - 8.3 g/dL   Albumin 4.4 3.5 - 5.2 g/dL   Calcium 9.6 8.4 - 10.5 mg/dL   GFR 54.02 (L) >60.00 mL/min  Lipid panel  Result Value Ref Range   Cholesterol 194 0 - 200 mg/dL   Triglycerides 172.0 (H) 0.0 - 149.0 mg/dL   HDL 47.50 >39.00 mg/dL   VLDL 34.4 0.0 - 40.0 mg/dL   LDL Cholesterol 112 (H) 0 - 99 mg/dL   Total CHOL/HDL Ratio 4    NonHDL 146.80   Hemoglobin A1c  Result Value Ref Range   Hgb A1c MFr Bld 7.2 (H) 4.6 - 6.5 %    Assessment and Plan:   There are no diagnoses linked to this encounter.  . Reviewed expectations re: course of current medical issues. . Discussed self-management of symptoms. . Outlined signs and symptoms indicating need for more acute intervention. . Patient verbalized understanding and all questions were answered. Marland Kitchen Health Maintenance issues including appropriate healthy diet, exercise, and smoking avoidance were discussed with patient. . See orders for this visit as documented in the electronic medical record. . Patient received an After Visit Summary.  *** CMA served as Education administrator during this visit. History, Physical, and Plan performed by medical provider. The above documentation has been reviewed and is accurate and complete. Briscoe Deutscher, D.O.  Briscoe Deutscher, DO [place], [facility] 12/26/2017

## 2017-12-26 NOTE — Progress Notes (Signed)
GYNECOLOGY  VISIT   HPI: 72 y.o.   Divorced White or Caucasian Not Hispanic or Latino  female   [phone] with No LMP recorded. Patient has had a hysterectomy.   here for 3 month recheck with pessary usage and estrogen cream.  She has a h/o a grade 3 cystocele and grade 1 vault prolapse. She has been using her pessary intermittently, had vaginal irritation with continued use. She has a compounded estradiol cream that she uses 2 x week. She has been using the pessary if she is busy, leaves it out for days at a time. No irritation, no bleeding, no significant vaginal discharge. Bladder is working okay, BM are fine.   GYNECOLOGIC HISTORY: No LMP recorded. Patient has had a hysterectomy. Contraception: Hysterectomy Menopausal hormone therapy:  Estrogen cream        OB History    Gravida  2   Para  2   Term  2   Preterm      AB      Living  1     SAB      TAB      Ectopic      Multiple      Live Births  2              Patient Active Problem List   Diagnosis Date Noted  . Atherosclerotic cardiovascular disease 10 year risk greater than 30% using 2013 ACC/AHA calculator 12/18/2017  . Type 2 diabetes mellitus with hyperglycemia, without long-term current use of insulin ([place]) 06/18/2016  . History of right knee joint replacement 06/17/2016  . History of left hip replacement 06/17/2016  . History of vaginal hysterectomy 06/17/2016  . OA (osteoarthritis) of knee 11/03/2015  . Hyperlipidemia associated with type 2 diabetes mellitus ([place]) 07/23/2014  . Genu valgum 08/11/2012  . Hypertension associated with diabetes ([place]) 02/25/2010    Past Medical History:  Diagnosis Date  . Anemia   . Cataract   . Diabetes mellitus without complication ([place])   . Gastric ulcer 11/17/2011  . History of total left hip replacement 2011  . HTN (hypertension)   . Hx of total knee replacement 2017   11-03-2015   . Hyperlipemia   . Hyperplastic polyp of intestine 11/17/2011  . Hypertension    . Measles   . Menopause   . Migraine   . Osteoarthritis     Past Surgical History:  Procedure Laterality Date  . CARPAL TUNNEL RELEASE Right   . CATARACT EXTRACTION Bilateral   . HIP ARTHROPLASTY Left 2011  . JOINT REPLACEMENT    . RETINAL DETACHMENT SURGERY    . TOTAL KNEE ARTHROPLASTY Right 11/03/2015   Procedure: RIGHT TOTAL KNEE ARTHROPLASTY;  Surgeon: Gaynelle Arabian, MD;  Location: [facility];  Service: Orthopedics;  Laterality: Right;  . TOTAL VAGINAL HYSTERECTOMY  2012   TVH, anterior repair, TVT (no mesh used for the anterior repair)    Current Outpatient Medications  Medication Sig Dispense Refill  . ACCU-CHEK FASTCLIX LANCETS MISC Check blood sugars 1-2 times per day. 100 each 2  . ACCU-CHEK GUIDE test strip TEST FOUR TIMES DAILY. 100 each 6  . aspirin EC 81 MG tablet Take 81 mg by mouth daily.    . blood glucose meter kit and supplies Dispense based on patient and insurance preference. Use up to four times daily as directed. (FOR ICD-9 250.00, 250.01). 1 each 0  . fluticasone (FLONASE) 50 MCG/ACT nasal spray PLACE 1 SPRAY INTO BOTH NOSTRILS 2 (TWO)  TIMES DAILY AS NEEDED FOR ALLERGIES OR RHINITIS. 16 g 2  . meclizine (ANTIVERT) 25 MG tablet TAKE 1 TABLET (25 MG TOTAL) BY MOUTH 3 (THREE) TIMES DAILY AS NEEDED FOR DIZZINESS. 30 tablet 0  . metFORMIN (GLUCOPHAGE-XR) 750 MG 24 hr tablet TAKE 1 TABLET BY MOUTH DAILY WITH BREAKFAST. 90 tablet 0  . NONFORMULARY OR COMPOUNDED ITEM Estradiol cream 0.02%, use 1/2 a gram vaginally 2 x a week at hs. 3 month supply sent, 2 refills 3 each 2  . phentermine (ADIPEX-P) 37.5 MG tablet   0  . pravastatin (PRAVACHOL) 40 MG tablet TAKE 1 TABLET BY MOUTH EVERY DAY 90 tablet 1  . triamterene-hydrochlorothiazide (DYAZIDE) 37.5-25 MG capsule TAKE 1 CAPSULE EACH BY MOUTH DAILY. 90 capsule 3   No current facility-administered medications for this visit.      ALLERGIES: Probiotic [acidophilus]  Family History  Problem Relation Age of Onset  .  Diabetes Mother        deceased  . Heart attack Mother   . Diabetes Sister        x2  . Kidney cancer Father   . Heart attack Father   . Diabetes Sister     Social History   Socioeconomic History  . Marital status: Divorced    Spouse name: Not on file  . Number of children: Not on file  . Years of education: Not on file  . Highest education level: Not on file  Occupational History  . Occupation: retired    Fish farm manager: RETIRED  Social Needs  . Financial resource strain: Not on file  . Food insecurity:    Worry: Not on file    Inability: Not on file  . Transportation needs:    Medical: Not on file    Non-medical: Not on file  Tobacco Use  . Smoking status: Never Smoker  . Smokeless tobacco: Never Used  Substance and Sexual Activity  . Alcohol use: No  . Drug use: No  . Sexual activity: Not Currently    Partners: Male    Birth control/protection: Post-menopausal, Surgical  Lifestyle  . Physical activity:    Days per week: Not on file    Minutes per session: Not on file  . Stress: Not on file  Relationships  . Social connections:    Talks on phone: Not on file    Gets together: Not on file    Attends religious service: Not on file    Active member of club or organization: Not on file    Attends meetings of clubs or organizations: Not on file    Relationship status: Not on file  . Intimate partner violence:    Fear of current or ex partner: Not on file    Emotionally abused: Not on file    Physically abused: Not on file    Forced sexual activity: Not on file  Other Topics Concern  . Not on file  Social History Narrative   Divorced, 2 children; retired.     Review of Systems  Constitutional: Negative.   HENT: Negative.   Eyes: Negative.   Respiratory: Negative.   Cardiovascular: Negative.   Gastrointestinal: Negative.   Genitourinary:       Urgency to urinate  Musculoskeletal: Negative.   Skin: Negative.   Neurological: Negative.    Endo/Heme/Allergies: Negative.   Psychiatric/Behavioral: Negative.     PHYSICAL EXAMINATION:    BP 128/78 (BP Location: Right Arm, Patient Position: Sitting, Cuff Size: Normal)   Pulse 80  Wt 145 lb (65.8 kg)   BMI 28.32 kg/m     General appearance: alert, cooperative and appears stated age  Pelvic: External genitalia:  no lesions              Urethra:  normal appearing urethra with no masses, tenderness or lesions              Bartholins and Skenes: normal                 Vagina: pessary was removed and cleaned. No vaginal irritation. Grade 3 cystocele, grade 1 vault prolapse. Pessary replaced.               Cervix: absent   Chaperone was present for exam.  ASSESSMENT Genital prolapse, controlled with prn use of the pessary (daily use led to vaginal irritation)    PLAN Continue with prn use of the pessary Vaginal estrogen 2 x a week F/U in 3 months for an annual exam and pessary check   An After Visit Summary was printed and given to the patient.

## 2017-12-27 ENCOUNTER — Ambulatory Visit: Payer: Medicare Other | Admitting: Family Medicine

## 2017-12-29 ENCOUNTER — Encounter: Payer: Self-pay | Admitting: Obstetrics and Gynecology

## 2017-12-29 ENCOUNTER — Other Ambulatory Visit: Payer: Self-pay

## 2017-12-29 ENCOUNTER — Ambulatory Visit (INDEPENDENT_AMBULATORY_CARE_PROVIDER_SITE_OTHER): Payer: Medicare Other | Admitting: Obstetrics and Gynecology

## 2017-12-29 VITALS — BP 128/78 | HR 80 | Wt 145.0 lb

## 2017-12-29 DIAGNOSIS — Z4689 Encounter for fitting and adjustment of other specified devices: Secondary | ICD-10-CM | POA: Diagnosis not present

## 2017-12-29 NOTE — Progress Notes (Signed)
Misty Cervantes is a 72 y.o. female is here for follow up.  History of Present Illness:   HPI:   1. Type 2 diabetes mellitus with hyperglycemia, without long-term current use of insulin ([place]).   Current symptoms: no polyuria or polydipsia, no chest pain, dyspnea or TIA's, no numbness, tingling or pain in extremities.   Lab Results  Component Value Date   HGBA1C 7.2 (H) 11/30/2017    Lab Results  Component Value Date   MICROALBUR 1.2 10/21/2017    Lab Results  Component Value Date   CHOL 194 11/30/2017   HDL 47.50 11/30/2017   LDLCALC 112 (H) 11/30/2017   TRIG 172.0 (H) 11/30/2017   CHOLHDL 4 11/30/2017     Wt Readings from Last 3 Encounters:  12/30/17 146 lb 3.2 oz (66.3 kg)  12/29/17 145 lb (65.8 kg)  11/30/17 145 lb 12.8 oz (66.1 kg)   BP Readings from Last 3 Encounters:  12/30/17 120/76  12/29/17 128/78  11/30/17 (!) 146/88   Lab Results  Component Value Date   CREATININE 1.06 11/30/2017     2. Hyperlipidemia associated with type 2 diabetes mellitus ([place]).   Trying to exercise on a regular basis? _0   YES  _1   NO Compliant with diet? _2   YES  _3   NO   Lipids:    Component Value Date/Time   CHOL 194 11/30/2017 1011   CHOL 203 (H) 08/25/2015 0955   CHOL 203 (H) 08/11/2012 1228   TRIG 172.0 (H) 11/30/2017 1011   TRIG 200 (H) 12/14/2012 1137   TRIG 173 (H) 08/11/2012 1228   HDL 47.50 11/30/2017 1011   HDL 41 08/25/2015 0955   HDL 46 12/14/2012 1137   HDL 42 08/11/2012 1228   VLDL 34.4 11/30/2017 1011   CHOLHDL 4 11/30/2017 1011    3. Hypertension associated with diabetes ([place]). .    4. Atherosclerotic cardiovascular disease 10 year risk greater than 30% using 2013 ACC/AHA calculator   5. Dizziness. Intermittent. Vertigo controlled with Meclizine. Needs refill. No new dizziness.    6. Acute pain of left shoulder. Continues since last visit. She still declines PT and injection. She will d/w her Orthopedist.    Health Maintenance Due  Topic Date  Due  . TETANUS/TDAP  12/31/1963  . PNA vac Low Risk Adult (2 of 2 - PCV13) 01/01/2012  . OPHTHALMOLOGY EXAM  08/06/2017  . INFLUENZA VACCINE  10/13/2017   Depression screen [facility] 2/9 10/21/2017 07/22/2016 06/28/2016  Decreased Interest 0 0 0  Down, Depressed, Hopeless 0 0 0  PHQ - 2 Score 0 0 0   PMHx, SurgHx, SocialHx, FamHx, Medications, and Allergies were reviewed in the Visit Navigator and updated as appropriate.   Patient Active Problem List   Diagnosis Date Noted  . Atherosclerotic cardiovascular disease 10 year risk greater than 30% using 2013 ACC/AHA calculator 12/18/2017  . Type 2 diabetes mellitus with hyperglycemia, without long-term current use of insulin ([place]) 06/18/2016  . History of right knee joint replacement 06/17/2016  . History of left hip replacement 06/17/2016  . History of vaginal hysterectomy 06/17/2016  . OA (osteoarthritis) of knee 11/03/2015  . Hyperlipidemia associated with type 2 diabetes mellitus ([place]) 07/23/2014  . Genu valgum 08/11/2012  . Hypertension associated with diabetes ([place]) 02/25/2010   Social History   Tobacco Use  . Smoking status: Never Smoker  . Smokeless tobacco: Never Used  Substance Use Topics  . Alcohol use: No  . Drug use: No  Current Medications and Allergies:   .  ACCU-CHEK FASTCLIX LANCETS MISC, Check blood sugars 1-2 times per day., Disp: 100 each, Rfl: 2 .  ACCU-CHEK GUIDE test strip, TEST FOUR TIMES DAILY., Disp: 100 each, Rfl: 6 .  aspirin EC 81 MG tablet, Take 81 mg by mouth daily., Disp: , Rfl:  .  blood glucose meter kit and supplies, Dispense based on patient and insurance preference. Use up to four times daily as directed. (FOR ICD-9 250.00, 250.01)., Disp: 1 each, Rfl: 0 .  fluticasone (FLONASE) 50 MCG/ACT nasal spray, PLACE 1 SPRAY INTO BOTH NOSTRILS 2 (TWO) TIMES DAILY AS NEEDED FOR ALLERGIES OR RHINITIS., Disp: 16 g, Rfl: 2 .  meclizine (ANTIVERT) 25 MG tablet, Take 1 tablet (25 mg total) by mouth 3 (three) times  daily as needed for dizziness., Disp: 30 tablet, Rfl: 0 .  metFORMIN (GLUCOPHAGE-XR) 750 MG 24 hr tablet, TAKE 1 TABLET BY MOUTH DAILY WITH BREAKFAST., Disp: 90 tablet, Rfl: 0 .  NONFORMULARY OR COMPOUNDED ITEM, Estradiol cream 0.02%, use 1/2 a gram vaginally 2 x a week at hs. 3 month supply sent, 2 refills, Disp: 3 each, Rfl: 2 .  pravastatin (PRAVACHOL) 40 MG tablet, TAKE 1 TABLET BY MOUTH EVERY DAY, Disp: 90 tablet, Rfl: 1 .  triamterene-hydrochlorothiazide (DYAZIDE) 37.5-25 MG capsule, TAKE 1 CAPSULE EACH BY MOUTH DAILY., Disp: 90 capsule, Rfl: 3   Allergies  Allergen Reactions  . Probiotic [Acidophilus]    Review of Systems   Pertinent items are noted in the HPI. Otherwise, ROS is negative.  Vitals:   Vitals:   12/30/17 0947  BP: 120/76  Pulse: 85  Temp: 97.8 F (36.6 C)  TempSrc: Oral  SpO2: 98%  Weight: 146 lb 3.2 oz (66.3 kg)  Height: 5' (1.524 m)     Body mass index is 28.55 kg/m.  Physical Exam:   Physical Exam  Constitutional: She appears well-nourished.  HENT:  Head: Normocephalic and atraumatic.  Eyes: Pupils are equal, round, and reactive to light. EOM are normal.  Neck: Normal range of motion. Neck supple.  Cardiovascular: Normal rate, regular rhythm, normal heart sounds and intact distal pulses.  Pulmonary/Chest: Effort normal.  Abdominal: Soft.  Skin: Skin is warm.  Psychiatric: She has a normal mood and affect. Her behavior is normal.  Nursing note and vitals reviewed.  Results for orders placed or performed in visit on 11/30/17  CBC with Differential/Platelet  Result Value Ref Range   WBC 4.0 4.0 - 10.5 K/uL   RBC 4.31 3.87 - 5.11 Mil/uL   Hemoglobin 13.6 12.0 - 15.0 g/dL   HCT 40.3 36.0 - 46.0 %   MCV 93.5 78.0 - 100.0 fl   MCHC 33.8 30.0 - 36.0 g/dL   RDW 13.0 11.5 - 15.5 %   Platelets 237.0 150.0 - 400.0 K/uL   Neutrophils Relative % 60.5 43.0 - 77.0 %   Lymphocytes Relative 28.7 12.0 - 46.0 %   Monocytes Relative 8.0 3.0 - 12.0 %    Eosinophils Relative 2.0 0.0 - 5.0 %   Basophils Relative 0.8 0.0 - 3.0 %   Neutro Abs 2.4 1.4 - 7.7 K/uL   Lymphs Abs 1.1 0.7 - 4.0 K/uL   Monocytes Absolute 0.3 0.1 - 1.0 K/uL   Eosinophils Absolute 0.1 0.0 - 0.7 K/uL   Basophils Absolute 0.0 0.0 - 0.1 K/uL  Comprehensive metabolic panel  Result Value Ref Range   Sodium 137 135 - 145 mEq/L   Potassium 3.7 3.5 - 5.1  mEq/L   Chloride 99 96 - 112 mEq/L   CO2 31 19 - 32 mEq/L   Glucose, Bld 146 (H) 70 - 99 mg/dL   BUN 17 6 - 23 mg/dL   Creatinine, Ser 1.06 0.40 - 1.20 mg/dL   Total Bilirubin 0.7 0.2 - 1.2 mg/dL   Alkaline Phosphatase 80 39 - 117 U/L   AST 19 0 - 37 U/L   ALT 17 0 - 35 U/L   Total Protein 7.2 6.0 - 8.3 g/dL   Albumin 4.4 3.5 - 5.2 g/dL   Calcium 9.6 8.4 - 10.5 mg/dL   GFR 54.02 (L) >60.00 mL/min  Lipid panel  Result Value Ref Range   Cholesterol 194 0 - 200 mg/dL   Triglycerides 172.0 (H) 0.0 - 149.0 mg/dL   HDL 47.50 >39.00 mg/dL   VLDL 34.4 0.0 - 40.0 mg/dL   LDL Cholesterol 112 (H) 0 - 99 mg/dL   Total CHOL/HDL Ratio 4    NonHDL 146.80   Hemoglobin A1c  Result Value Ref Range   Hgb A1c MFr Bld 7.2 (H) 4.6 - 6.5 %    Assessment and Plan:   Diagnoses and all orders for this visit:  Type 2 diabetes mellitus with hyperglycemia, without long-term current use of insulin ([facility])  Hyperlipidemia associated with type 2 diabetes mellitus ([place])  Hypertension associated with diabetes ([place])  Atherosclerotic cardiovascular disease 10 year risk greater than 30% using 2013 ACC/AHA calculator  Dizziness -     meclizine (ANTIVERT) 25 MG tablet; Take 1 tablet (25 mg total) by mouth 3 (three) times daily as needed for dizziness.  Acute pain of left shoulder  Primary insomnia Comments: Will trial below. Not sleeping until 2 am. Chronic, intermittent issue for her. Orders: -     traZODone (DESYREL) 50 MG tablet; Take 0.5-1 tablets (25-50 mg total) by mouth at bedtime as needed for sleep.   . Reviewed  expectations re: course of current medical issues. . Discussed self-management of symptoms. . Outlined signs and symptoms indicating need for more acute intervention. . Patient verbalized understanding and all questions were answered. Marland Kitchen Health Maintenance issues including appropriate healthy diet, exercise, and smoking avoidance were discussed with patient. . See orders for this visit as documented in the electronic medical record. . Patient received an After Visit Summary.  Briscoe Deutscher, DO [place] 12/30/2017

## 2017-12-30 ENCOUNTER — Ambulatory Visit (INDEPENDENT_AMBULATORY_CARE_PROVIDER_SITE_OTHER): Payer: Medicare Other | Admitting: Family Medicine

## 2017-12-30 ENCOUNTER — Encounter: Payer: Self-pay | Admitting: Family Medicine

## 2017-12-30 VITALS — BP 120/76 | HR 85 | Temp 97.8°F | Ht 60.0 in | Wt 146.2 lb

## 2017-12-30 DIAGNOSIS — E1159 Type 2 diabetes mellitus with other circulatory complications: Secondary | ICD-10-CM | POA: Diagnosis not present

## 2017-12-30 DIAGNOSIS — Z9189 Other specified personal risk factors, not elsewhere classified: Secondary | ICD-10-CM | POA: Diagnosis not present

## 2017-12-30 DIAGNOSIS — R42 Dizziness and giddiness: Secondary | ICD-10-CM

## 2017-12-30 DIAGNOSIS — E1169 Type 2 diabetes mellitus with other specified complication: Secondary | ICD-10-CM | POA: Diagnosis not present

## 2017-12-30 DIAGNOSIS — E1165 Type 2 diabetes mellitus with hyperglycemia: Secondary | ICD-10-CM

## 2017-12-30 DIAGNOSIS — E785 Hyperlipidemia, unspecified: Secondary | ICD-10-CM

## 2017-12-30 DIAGNOSIS — I152 Hypertension secondary to endocrine disorders: Secondary | ICD-10-CM

## 2017-12-30 DIAGNOSIS — F5101 Primary insomnia: Secondary | ICD-10-CM

## 2017-12-30 DIAGNOSIS — I10 Essential (primary) hypertension: Secondary | ICD-10-CM

## 2017-12-30 DIAGNOSIS — M25512 Pain in left shoulder: Secondary | ICD-10-CM

## 2017-12-30 MED ORDER — MECLIZINE HCL 25 MG PO TABS: 25.0000 mg | ORAL_TABLET | Freq: Three times a day (TID) | ORAL | 0 refills | Status: DC | PRN

## 2017-12-30 MED ORDER — TRAZODONE HCL 50 MG PO TABS: 25.0000 mg | ORAL_TABLET | Freq: Every evening | ORAL | 3 refills | Status: DC | PRN

## 2017-12-30 NOTE — Patient Instructions (Signed)
You are still doing very well! Don't be hard on yourself.  Increase your water and walking.  Decrease your Dyazide to every other day.

## 2018-01-02 ENCOUNTER — Other Ambulatory Visit: Payer: Self-pay | Admitting: Family Medicine

## 2018-01-02 DIAGNOSIS — E1165 Type 2 diabetes mellitus with hyperglycemia: Secondary | ICD-10-CM

## 2018-01-21 ENCOUNTER — Other Ambulatory Visit: Payer: Self-pay | Admitting: Family Medicine

## 2018-01-21 DIAGNOSIS — F5101 Primary insomnia: Secondary | ICD-10-CM

## 2018-02-02 ENCOUNTER — Other Ambulatory Visit: Payer: Self-pay | Admitting: Family Medicine

## 2018-02-02 DIAGNOSIS — E1165 Type 2 diabetes mellitus with hyperglycemia: Secondary | ICD-10-CM

## 2018-02-13 ENCOUNTER — Other Ambulatory Visit: Payer: Self-pay | Admitting: Family Medicine

## 2018-02-22 ENCOUNTER — Other Ambulatory Visit: Payer: Self-pay | Admitting: Family Medicine

## 2018-02-22 DIAGNOSIS — R42 Dizziness and giddiness: Secondary | ICD-10-CM

## 2018-03-27 ENCOUNTER — Ambulatory Visit: Payer: Medicare Other | Admitting: Family Medicine

## 2018-03-27 ENCOUNTER — Other Ambulatory Visit: Payer: Self-pay | Admitting: Family Medicine

## 2018-03-27 DIAGNOSIS — E1165 Type 2 diabetes mellitus with hyperglycemia: Secondary | ICD-10-CM

## 2018-04-03 NOTE — Progress Notes (Deleted)
73 y.o. [id number] Divorced White or Caucasian Not Hispanic or Latino female here for annual exam and pessary recheck.      No LMP recorded. Patient has had a hysterectomy.          Sexually active: {yes no:314532}  The current method of family planning is {contraception:315051}.    Exercising: {yes no:314532}  {types:19826} Smoker:  {YES NO:22349}  Health Maintenance: Pap:  Unsure- hysterectomy about 10 years ago  History of abnormal Pap:  no MMG:  09/07/2017 [place] 1 negative Colonoscopy:  11-17-11 polyp repeat in 5 yrs  BMD:   08-26-16 Normal TDaP:  unsure Gardasil: N/A   reports that she has never smoked. She has never used smokeless tobacco. She reports that she does not drink alcohol or use drugs.  Past Medical History:  Diagnosis Date  . Anemia   . Cataract   . Diabetes mellitus without complication ([place])   . Gastric ulcer 11/17/2011  . History of total left hip replacement 2011  . HTN (hypertension)   . Hx of total knee replacement 2017   11-03-2015   . Hyperlipemia   . Hyperplastic polyp of intestine 11/17/2011  . Hypertension   . Measles   . Menopause   . Migraine   . Osteoarthritis     Past Surgical History:  Procedure Laterality Date  . CARPAL TUNNEL RELEASE Right   . CATARACT EXTRACTION Bilateral   . HIP ARTHROPLASTY Left 2011  . JOINT REPLACEMENT    . RETINAL DETACHMENT SURGERY    . TOTAL KNEE ARTHROPLASTY Right 11/03/2015   Procedure: RIGHT TOTAL KNEE ARTHROPLASTY;  Surgeon: Gaynelle Arabian, MD;  Location: [facility];  Service: Orthopedics;  Laterality: Right;  . TOTAL VAGINAL HYSTERECTOMY  2012   TVH, anterior repair, TVT (no mesh used for the anterior repair)    Current Outpatient Medications  Medication Sig Dispense Refill  . ACCU-CHEK FASTCLIX LANCETS MISC Check blood sugars 1-2 times per day. 100 each 2  . ACCU-CHEK GUIDE test strip TEST FOUR TIMES DAILY. 100 each 6  . aspirin EC 81 MG tablet Take 81 mg by mouth daily.    . blood glucose meter kit and  supplies Dispense based on patient and insurance preference. Use up to four times daily as directed. (FOR ICD-9 250.00, 250.01). 1 each 0  . fluticasone (FLONASE) 50 MCG/ACT nasal spray PLACE 1 SPRAY INTO BOTH NOSTRILS 2 (TWO) TIMES DAILY AS NEEDED FOR ALLERGIES OR RHINITIS. 16 g 2  . meclizine (ANTIVERT) 25 MG tablet TAKE 1 TABLET (25 MG TOTAL) BY MOUTH 3 (THREE) TIMES DAILY AS NEEDED FOR DIZZINESS. 30 tablet 0  . metFORMIN (GLUCOPHAGE-XR) 750 MG 24 hr tablet TAKE 1 TABLET BY MOUTH DAILY WITH BREAKFAST. 90 tablet 0  . NONFORMULARY OR COMPOUNDED ITEM Estradiol cream 0.02%, use 1/2 a gram vaginally 2 x a week at hs. 3 month supply sent, 2 refills 3 each 2  . pravastatin (PRAVACHOL) 40 MG tablet TAKE 1 TABLET BY MOUTH EVERY DAY 90 tablet 0  . traZODone (DESYREL) 50 MG tablet TAKE 0.5-1 TABLETS BY MOUTH AT BEDTIME AS NEEDED FOR SLEEP. 90 tablet 2  . triamterene-hydrochlorothiazide (DYAZIDE) 37.5-25 MG capsule TAKE 1 CAPSULE EACH BY MOUTH DAILY. 90 capsule 3   No current facility-administered medications for this visit.     Family History  Problem Relation Age of Onset  . Diabetes Mother        deceased  . Heart attack Mother   . Diabetes Sister  x2  . Kidney cancer Father   . Heart attack Father   . Diabetes Sister     Review of Systems  Exam:   There were no vitals taken for this visit.  Weight change: @WEIGHTCHANGE @ Height:      Ht Readings from Last 3 Encounters:  12/30/17 5' (1.524 m)  11/30/17 5' (1.524 m)  10/21/17 5' (1.524 m)    General appearance: alert, cooperative and appears stated age Head: Normocephalic, without obvious abnormality, atraumatic Neck: no adenopathy, supple, symmetrical, trachea midline and thyroid {CHL AMB PHY EX THYROID NORM DEFAULT:[id number]::"normal to inspection and palpation"} Lungs: clear to auscultation bilaterally Cardiovascular: regular rate and rhythm Breasts: {Exam; breast:13139::"normal appearance, no masses or tenderness"} Abdomen:  soft, non-tender; non distended,  no masses,  no organomegaly Extremities: extremities normal, atraumatic, no cyanosis or edema Skin: Skin color, texture, turgor normal. No rashes or lesions Lymph nodes: Cervical, supraclavicular, and axillary nodes normal. No abnormal inguinal nodes palpated Neurologic: Grossly normal   Pelvic: External genitalia:  no lesions              Urethra:  normal appearing urethra with no masses, tenderness or lesions              Bartholins and Skenes: normal                 Vagina: normal appearing vagina with normal color and discharge, no lesions              Cervix: {CHL AMB PHY EX CERVIX NORM [phone]::"no lesions"}               Bimanual Exam:  Uterus:  {CHL AMB PHY EX UTERUS NORM DEFAULT:[phone]::"normal size, contour, position, consistency, mobility, non-tender"}              Adnexa: {CHL AMB PHY EX ADNEXA NO MASS DEFAULT:[phone]::"no mass, fullness, tenderness"}               Rectovaginal: Confirms               Anus:  normal sphincter tone, no lesions  Chaperone was present for exam.  A:  Well Woman with normal exam  P:

## 2018-04-05 ENCOUNTER — Ambulatory Visit: Payer: Medicare Other | Admitting: Obstetrics and Gynecology

## 2018-04-10 NOTE — Progress Notes (Signed)
Misty Cervantes is a 73 y.o. female is here for follow up.  History of Present Illness:   HPI:   DM. Medication compliance: compliant all of the time, compliant most of the time, diabetic diet compliance: compliant most of the time, home glucose monitoring: is performed regularly, further diabetic ROS: no polyuria or polydipsia, no chest pain, dyspnea or TIA's, no numbness, tingling or pain in extremities.  HTN: Review: taking medications as instructed, no medication side effects noted, no TIAs, no chest pain on exertion, no dyspnea on exertion, no swelling of ankles. Smoker: No. BP Readings from Last 3 Encounters:  04/11/18 128/78  12/30/17 120/76  12/29/17 128/78   Lab Results  Component Value Date   CREATININE 1.06 11/30/2017   CREATININE 1.07 09/22/2016   CREATININE 1.16 06/17/2016     Health Maintenance Due  Topic Date Due  . TETANUS/TDAP  12/31/1964  . OPHTHALMOLOGY EXAM  08/06/2017   Depression screen [facility] 2/9 04/11/2018 10/21/2017 07/22/2016  Decreased Interest 1 0 0  Down, Depressed, Hopeless 0 0 0  PHQ - 2 Score 1 0 0  Altered sleeping 0 - -  Tired, decreased energy 1 - -  Change in appetite 0 - -  Feeling bad or failure about yourself  1 - -  Trouble concentrating 0 - -  Moving slowly or fidgety/restless 0 - -  Suicidal thoughts 0 - -  PHQ-9 Score 3 - -  Difficult doing work/chores Not difficult at all - -   PMHx, SurgHx, SocialHx, FamHx, Medications, and Allergies were reviewed in the Visit Navigator and updated as appropriate.   Patient Active Problem List   Diagnosis Date Noted  . Atherosclerotic cardiovascular disease 10 year risk greater than 30% using 2013 ACC/AHA calculator 12/18/2017  . Type 2 diabetes mellitus with hyperglycemia, without long-term current use of insulin ([place]) 06/18/2016  . History of right knee joint replacement 06/17/2016  . History of left hip replacement 06/17/2016  . History of vaginal hysterectomy 06/17/2016  . OA  (osteoarthritis) of knee 11/03/2015  . Hyperlipidemia associated with type 2 diabetes mellitus ([place]) 07/23/2014  . Genu valgum 08/11/2012  . Hypertension associated with diabetes ([place]) 02/25/2010   Social History   Tobacco Use  . Smoking status: Never Smoker  . Smokeless tobacco: Never Used  Substance Use Topics  . Alcohol use: No  . Drug use: No   Current Medications and Allergies   Current Outpatient Medications:  .  ACCU-CHEK FASTCLIX LANCETS MISC, Check blood sugars 1-2 times per day., Disp: 100 each, Rfl: 2 .  ACCU-CHEK GUIDE test strip, TEST FOUR TIMES DAILY., Disp: 100 each, Rfl: 6 .  aspirin EC 81 MG tablet, Take 81 mg by mouth daily., Disp: , Rfl:  .  fluticasone (FLONASE) 50 MCG/ACT nasal spray, PLACE 1 SPRAY INTO BOTH NOSTRILS 2 (TWO) TIMES DAILY AS NEEDED FOR ALLERGIES OR RHINITIS., Disp: 16 g, Rfl: 2 .  meclizine (ANTIVERT) 25 MG tablet, TAKE 1 TABLET (25 MG TOTAL) BY MOUTH 3 (THREE) TIMES DAILY AS NEEDED FOR DIZZINESS., Disp: 30 tablet, Rfl: 0 .  metFORMIN (GLUCOPHAGE-XR) 750 MG 24 hr tablet, TAKE 1 TABLET BY MOUTH DAILY WITH BREAKFAST., Disp: 90 tablet, Rfl: 0 .  NONFORMULARY OR COMPOUNDED ITEM, Estradiol cream 0.02%, use 1/2 a gram vaginally 2 x a week at hs. 3 month supply sent, 2 refills, Disp: 3 each, Rfl: 2 .  phentermine (ADIPEX-P) 37.5 MG tablet, Take 37.5 mg by mouth 2 (two) times daily., Disp: , Rfl:  .  pravastatin (PRAVACHOL) 40 MG tablet, TAKE 1 TABLET BY MOUTH EVERY DAY, Disp: 90 tablet, Rfl: 0 .  traZODone (DESYREL) 50 MG tablet, TAKE 0.5-1 TABLETS BY MOUTH AT BEDTIME AS NEEDED FOR SLEEP., Disp: 90 tablet, Rfl: 2 .  triamterene-hydrochlorothiazide (DYAZIDE) 37.5-25 MG capsule, TAKE 1 CAPSULE EACH BY MOUTH DAILY., Disp: 90 capsule, Rfl: 3   Allergies  Allergen Reactions  . Probiotic [Acidophilus]    Review of Systems   Pertinent items are noted in the HPI. Otherwise, a complete ROS is negative.  Vitals   Vitals:   04/11/18 1501  BP: 128/78    Pulse: 85  Temp: 98.6 F (37 C)  TempSrc: Oral  SpO2: 98%  Weight: 147 lb 9.6 oz (67 kg)  Height: 5' (1.524 m)     Body mass index is 28.83 kg/m.  Physical Exam   Physical Exam Vitals signs and nursing note reviewed.  Constitutional:      General: She is not in acute distress. HENT:     Head: Normocephalic and atraumatic.     Nose: Nose normal.     Mouth/Throat:     Mouth: Mucous membranes are moist.  Eyes:     Pupils: Pupils are equal, round, and reactive to light.  Neck:     Musculoskeletal: Normal range of motion and neck supple.  Cardiovascular:     Rate and Rhythm: Normal rate and regular rhythm.     Heart sounds: Normal heart sounds.  Pulmonary:     Effort: Pulmonary effort is normal.  Abdominal:     Palpations: Abdomen is soft.  Skin:    General: Skin is warm.  Neurological:     Mental Status: She is alert.  Psychiatric:        Behavior: Behavior normal.     Results for orders placed or performed in visit on 04/11/18  POCT glycosylated hemoglobin (Hb A1C)  Result Value Ref Range   Hemoglobin A1C 7.4 (A) 4.0 - 5.6 %    Assessment and Plan   Misty Cervantes was seen today for follow-up.  Diagnoses and all orders for this visit:  Hypertension associated with diabetes ([place])  Hyperlipidemia associated with type 2 diabetes mellitus ([place])  Type 2 diabetes mellitus with hyperglycemia, without long-term current use of insulin ([place]) -     Ambulatory referral to Ophthalmology -     Cancel: Tdap vaccine greater than or equal to 7yo IM -     POCT glycosylated hemoglobin (Hb A1C)  Atherosclerotic cardiovascular disease 10 year risk greater than 30% using 2013 ACC/AHA calculator  Need for prophylactic vaccination and inoculation against influenza -     Flu vaccine HIGH DOSE PF (Fluzone High dose)  Need for prophylactic vaccination against Streptococcus pneumoniae (pneumococcus) -     Pneumococcal polysaccharide vaccine 23-valent greater than or equal to 2yo  subcutaneous/IM  Need for diphtheria-tetanus-pertussis (Tdap) vaccine -     Tdap (BOOSTRIX) 5-2.5-18.5 LF-MCG/0.5 injection; Inject 0.5 mLs into the muscle once for 1 dose.  High risk medication use Comments: Patient going to weight loss center and Rx phentermine with Rx written as BID. Confirmed database. Discussed cardiac risks.    . Orders and follow up as documented in [place], reviewed diet, exercise and weight control, cardiovascular risk and specific lipid/LDL goals reviewed, reviewed medications and side effects in detail.  . Reviewed expectations re: course of current medical issues. . Outlined signs and symptoms indicating need for more acute intervention. . Patient verbalized understanding and all questions were answered. Marland Kitchen  Patient received an After Visit Summary.  Briscoe Deutscher, DO [place], [facility] 04/12/2018

## 2018-04-11 ENCOUNTER — Ambulatory Visit (INDEPENDENT_AMBULATORY_CARE_PROVIDER_SITE_OTHER): Payer: Medicare Other | Admitting: Family Medicine

## 2018-04-11 ENCOUNTER — Encounter: Payer: Self-pay | Admitting: Family Medicine

## 2018-04-11 VITALS — BP 128/78 | HR 85 | Temp 98.6°F | Ht 60.0 in | Wt 147.6 lb

## 2018-04-11 DIAGNOSIS — Z23 Encounter for immunization: Secondary | ICD-10-CM

## 2018-04-11 DIAGNOSIS — E785 Hyperlipidemia, unspecified: Secondary | ICD-10-CM

## 2018-04-11 DIAGNOSIS — Z9189 Other specified personal risk factors, not elsewhere classified: Secondary | ICD-10-CM | POA: Diagnosis not present

## 2018-04-11 DIAGNOSIS — I10 Essential (primary) hypertension: Secondary | ICD-10-CM

## 2018-04-11 DIAGNOSIS — E1159 Type 2 diabetes mellitus with other circulatory complications: Secondary | ICD-10-CM

## 2018-04-11 DIAGNOSIS — E1169 Type 2 diabetes mellitus with other specified complication: Secondary | ICD-10-CM

## 2018-04-11 DIAGNOSIS — Z79899 Other long term (current) drug therapy: Secondary | ICD-10-CM

## 2018-04-11 DIAGNOSIS — I152 Hypertension secondary to endocrine disorders: Secondary | ICD-10-CM

## 2018-04-11 DIAGNOSIS — E1165 Type 2 diabetes mellitus with hyperglycemia: Secondary | ICD-10-CM | POA: Diagnosis not present

## 2018-04-11 LAB — POCT GLYCOSYLATED HEMOGLOBIN (HGB A1C): Hemoglobin A1C: 7.4 % — AB (ref 4.0–5.6)

## 2018-04-11 MED ORDER — TETANUS-DIPHTH-ACELL PERTUSSIS 5-2.5-18.5 LF-MCG/0.5 IM SUSP: 0.5000 mL | Freq: Once | INTRAMUSCULAR | 0 refills | Status: AC

## 2018-04-14 ENCOUNTER — Other Ambulatory Visit: Payer: Self-pay | Admitting: Family Medicine

## 2018-04-14 DIAGNOSIS — R42 Dizziness and giddiness: Secondary | ICD-10-CM

## 2018-04-18 ENCOUNTER — Other Ambulatory Visit: Payer: Self-pay

## 2018-04-18 ENCOUNTER — Telehealth: Payer: Self-pay

## 2018-04-18 ENCOUNTER — Encounter: Payer: Self-pay | Admitting: Obstetrics and Gynecology

## 2018-04-18 ENCOUNTER — Ambulatory Visit: Payer: Medicare Other | Admitting: Obstetrics and Gynecology

## 2018-04-18 VITALS — BP 116/72 | HR 88 | Ht 59.5 in | Wt 146.6 lb

## 2018-04-18 DIAGNOSIS — Z4689 Encounter for fitting and adjustment of other specified devices: Secondary | ICD-10-CM

## 2018-04-18 DIAGNOSIS — N952 Postmenopausal atrophic vaginitis: Secondary | ICD-10-CM

## 2018-04-18 DIAGNOSIS — N8111 Cystocele, midline: Secondary | ICD-10-CM

## 2018-04-18 DIAGNOSIS — N819 Female genital prolapse, unspecified: Secondary | ICD-10-CM | POA: Diagnosis not present

## 2018-04-18 DIAGNOSIS — Z01419 Encounter for gynecological examination (general) (routine) without abnormal findings: Secondary | ICD-10-CM | POA: Diagnosis not present

## 2018-04-18 MED ORDER — NONFORMULARY OR COMPOUNDED ITEM: 3 refills | Status: DC

## 2018-04-18 NOTE — Telephone Encounter (Signed)
-----   Message from Salvadore Dom, MD sent at 04/18/2018 10:30 AM EST ----- Please refill her vaginal estrogen x 1 year (compounded). Thanks, Sharee Pimple

## 2018-04-18 NOTE — Patient Instructions (Signed)
EXERCISE AND DIET:  We recommended that you start or continue a regular exercise program for good health. Regular exercise means any activity that makes your heart beat faster and makes you sweat.  We recommend exercising at least 30 minutes per day at least 3 days a week, preferably 4 or 5.  We also recommend a diet low in fat and sugar.  Inactivity, poor dietary choices and obesity can cause diabetes, heart attack, stroke, and kidney damage, among others.    ALCOHOL AND SMOKING:  Women should limit their alcohol intake to no more than 7 drinks/beers/glasses of wine (combined, not each!) per week. Moderation of alcohol intake to this level decreases your risk of breast cancer and liver damage. And of course, no recreational drugs are part of a healthy lifestyle.  And absolutely no smoking or even second hand smoke. Most people know smoking can cause heart and lung diseases, but did you know it also contributes to weakening of your bones? Aging of your skin?  Yellowing of your teeth and nails?  CALCIUM AND VITAMIN D:  Adequate intake of calcium and Vitamin D are recommended.  The recommendations for exact amounts of these supplements seem to change often, but generally speaking 1,200 mg of calcium (between diet and supplement) and 800 units of Vitamin D per day seems prudent. Certain women may benefit from higher intake of Vitamin D.  If you are among these women, your doctor will have told you during your visit.    PAP SMEARS:  Pap smears, to check for cervical cancer or precancers,  have traditionally been done yearly, although recent scientific advances have shown that most women can have pap smears less often.  However, every woman still should have a physical exam from her gynecologist every year. It will include a breast check, inspection of the vulva and vagina to check for abnormal growths or skin changes, a visual exam of the cervix, and then an exam to evaluate the size and shape of the uterus and  ovaries.  And after 74 years of age, a rectal exam is indicated to check for rectal cancers. We will also provide age appropriate advice regarding health maintenance, like when you should have certain vaccines, screening for sexually transmitted diseases, bone density testing, colonoscopy, mammograms, etc.   MAMMOGRAMS:  All women over 40 years old should have a yearly mammogram. Many facilities now offer a "3D" mammogram, which may cost around $50 extra out of pocket. If possible,  we recommend you accept the option to have the 3D mammogram performed.  It both reduces the number of women who will be called back for extra views which then turn out to be normal, and it is better than the routine mammogram at detecting truly abnormal areas.    COLON CANCER SCREENING: Now recommend starting at age 45. At this time colonoscopy is not covered for routine screening until 50. There are take home tests that can be done between 45-49.   COLONOSCOPY:  Colonoscopy to screen for colon cancer is recommended for all women at age 50.  We know, you hate the idea of the prep.  We agree, BUT, having colon cancer and not knowing it is worse!!  Colon cancer so often starts as a polyp that can be seen and removed at colonscopy, which can quite literally save your life!  And if your first colonoscopy is normal and you have no family history of colon cancer, most women don't have to have it again for   10 years.  Once every ten years, you can do something that may end up saving your life, right?  We will be happy to help you get it scheduled when you are ready.  Be sure to check your insurance coverage so you understand how much it will cost.  It may be covered as a preventative service at no cost, but you should check your particular policy.      Breast Self-Awareness Breast self-awareness means being familiar with how your breasts look and feel. It involves checking your breasts regularly and reporting any changes to your  health care provider. Practicing breast self-awareness is important. A change in your breasts can be a sign of a serious medical problem. Being familiar with how your breasts look and feel allows you to find any problems early, when treatment is more likely to be successful. All women should practice breast self-awareness, including women who have had breast implants. How to do a breast self-exam One way to learn what is normal for your breasts and whether your breasts are changing is to do a breast self-exam. To do a breast self-exam: Look for Changes  1. Remove all the clothing above your waist. 2. Stand in front of a mirror in a room with good lighting. 3. Put your hands on your hips. 4. Push your hands firmly downward. 5. Compare your breasts in the mirror. Look for differences between them (asymmetry), such as: ? Differences in shape. ? Differences in size. ? Puckers, dips, and bumps in one breast and not the other. 6. Look at each breast for changes in your skin, such as: ? Redness. ? Scaly areas. 7. Look for changes in your nipples, such as: ? Discharge. ? Bleeding. ? Dimpling. ? Redness. ? A change in position. Feel for Changes Carefully feel your breasts for lumps and changes. It is best to do this while lying on your back on the floor and again while sitting or standing in the shower or tub with soapy water on your skin. Feel each breast in the following way:  Place the arm on the side of the breast you are examining above your head.  Feel your breast with the other hand.  Start in the nipple area and make  inch (2 cm) overlapping circles to feel your breast. Use the pads of your three middle fingers to do this. Apply light pressure, then medium pressure, then firm pressure. The light pressure will allow you to feel the tissue closest to the skin. The medium pressure will allow you to feel the tissue that is a little deeper. The firm pressure will allow you to feel the tissue  close to the ribs.  Continue the overlapping circles, moving downward over the breast until you feel your ribs below your breast.  Move one finger-width toward the center of the body. Continue to use the  inch (2 cm) overlapping circles to feel your breast as you move slowly up toward your collarbone.  Continue the up and down exam using all three pressures until you reach your armpit.  Write Down What You Find  Write down what is normal for each breast and any changes that you find. Keep a written record with breast changes or normal findings for each breast. By writing this information down, you do not need to depend only on memory for size, tenderness, or location. Write down where you are in your menstrual cycle, if you are still menstruating. If you are having trouble noticing differences   in your breasts, do not get discouraged. With time you will become more familiar with the variations in your breasts and more comfortable with the exam. How often should I examine my breasts? Examine your breasts every month. If you are breastfeeding, the best time to examine your breasts is after a feeding or after using a breast pump. If you menstruate, the best time to examine your breasts is 5-7 days after your period is over. During your period, your breasts are lumpier, and it may be more difficult to notice changes. When should I see my health care provider? See your health care provider if you notice:  A change in shape or size of your breasts or nipples.  A change in the skin of your breast or nipples, such as a reddened or scaly area.  Unusual discharge from your nipples.  A lump or thick area that was not there before.  Pain in your breasts.  Anything that concerns you.  

## 2018-04-18 NOTE — Progress Notes (Signed)
73 y.o. G10P2001 Divorced White or Caucasian Not Hispanic or Latino female here for annual exam and pessary recheck.  H/O TVH, still has her ovaries. H/O grade 3 cystocele and grade 1 vault prolapse.  She is uses her pessary intermittently (was irritated with continued use). She leaves it in for a few days, then takes it our for a couple of days.  Stable urge incontinence, can leak first thing in the morning on the way to the bathroom. She leaks a small amount a couple of times a week. Wears a pad. Feels she is emptying her bladder.  No bowel c/o. No vaginal bleeding.  Currently she has a URI.     No LMP recorded. Patient has had a hysterectomy.          Sexually active: No.  The current method of family planning is status post hysterectomy.    Exercising: Yes.    [facility] Smoker:  no  Health Maintenance: Pap:  11 years ago per patient   History of abnormal Pap:  No MMG:  09/08/2017 Birads 1 negative Colonoscopy:  11-17-11 polyp repeat in 10 years per patient  BMD:   08-26-16 Normal TDaP:  Unsure, has rx from PCP to get this at the pharmacy Gardasil: N/A   reports that she has never smoked. She has never used smokeless tobacco. She reports that she does not drink alcohol or use drugs. Cares for her 2 grandchildren, Markus Daft is 20, Rolla Plate is 12 this week. Also lives with her Son (not the Father of the kids). Other granddaughter has a 88 year old and is pregnant.   Past Medical History:  Diagnosis Date  . Anemia   . Cataract   . Diabetes mellitus without complication ([place])   . Gastric ulcer 11/17/2011  . History of total left hip replacement 2011  . HTN (hypertension)   . Hx of total knee replacement 2017   11-03-2015   . Hyperlipemia   . Hyperplastic polyp of intestine 11/17/2011  . Hypertension   . Measles   . Menopause   . Migraine   . Osteoarthritis     Past Surgical History:  Procedure Laterality Date  . CARPAL TUNNEL RELEASE Right   . CATARACT  EXTRACTION Bilateral   . HIP ARTHROPLASTY Left 2011  . JOINT REPLACEMENT    . RETINAL DETACHMENT SURGERY    . TOTAL KNEE ARTHROPLASTY Right 11/03/2015   Procedure: RIGHT TOTAL KNEE ARTHROPLASTY;  Surgeon: Gaynelle Arabian, MD;  Location: [facility];  Service: Orthopedics;  Laterality: Right;  . TOTAL VAGINAL HYSTERECTOMY  2012   TVH, anterior repair, TVT (no mesh used for the anterior repair)    Current Outpatient Medications  Medication Sig Dispense Refill  . ACCU-CHEK FASTCLIX LANCETS MISC Check blood sugars 1-2 times per day. 100 each 2  . ACCU-CHEK GUIDE test strip TEST FOUR TIMES DAILY. 100 each 6  . aspirin EC 81 MG tablet Take 81 mg by mouth daily.    . fluticasone (FLONASE) 50 MCG/ACT nasal spray PLACE 1 SPRAY INTO BOTH NOSTRILS 2 (TWO) TIMES DAILY AS NEEDED FOR ALLERGIES OR RHINITIS. 16 g 2  . meclizine (ANTIVERT) 25 MG tablet TAKE 1 TABLET (25 MG TOTAL) BY MOUTH 3 (THREE) TIMES DAILY AS NEEDED FOR DIZZINESS. 30 tablet 0  . metFORMIN (GLUCOPHAGE-XR) 750 MG 24 hr tablet TAKE 1 TABLET BY MOUTH DAILY WITH BREAKFAST. 90 tablet 0  . NONFORMULARY OR COMPOUNDED ITEM Estradiol cream 0.02%, use 1/2 a  gram vaginally 2 x a week at hs. 3 month supply sent, 2 refills 3 each 2  . phentermine (ADIPEX-P) 37.5 MG tablet Take 37.5 mg by mouth 2 (two) times daily.    . pravastatin (PRAVACHOL) 40 MG tablet TAKE 1 TABLET BY MOUTH EVERY DAY 90 tablet 0  . traZODone (DESYREL) 50 MG tablet TAKE 0.5-1 TABLETS BY MOUTH AT BEDTIME AS NEEDED FOR SLEEP. 90 tablet 2  . triamterene-hydrochlorothiazide (DYAZIDE) 37.5-25 MG capsule TAKE 1 CAPSULE EACH BY MOUTH DAILY. 90 capsule 3   No current facility-administered medications for this visit.     Family History  Problem Relation Age of Onset  . Diabetes Mother        deceased  . Heart attack Mother   . Diabetes Sister        x2  . Kidney cancer Father   . Heart attack Father   . Diabetes Sister     Review of Systems  Constitutional: Negative.   HENT:  Positive for postnasal drip and sinus pressure.   Eyes: Negative.   Respiratory: Negative.   Cardiovascular: Negative.   Gastrointestinal: Negative.   Endocrine: Negative.   Genitourinary: Positive for urgency.  Musculoskeletal: Negative.   Skin: Negative.   Allergic/Immunologic: Negative.   Neurological: Positive for headaches.  Hematological: Negative.   Psychiatric/Behavioral: Negative.     Exam:   BP 116/72 (BP Location: Right Arm, Patient Position: Sitting, Cuff Size: Normal)   Pulse 88   Ht 4' 11.5" (1.511 m)   Wt 146 lb 9.6 oz (66.5 kg)   BMI 29.11 kg/m   Weight change: @WEIGHTCHANGE @ Height:   Height: 4' 11.5" (151.1 cm)  Ht Readings from Last 3 Encounters:  04/18/18 4' 11.5" (1.511 m)  04/11/18 5' (1.524 m)  12/30/17 5' (1.524 m)    General appearance: alert, cooperative and appears stated age Head: Normocephalic, without obvious abnormality, atraumatic Neck: no adenopathy, supple, symmetrical, trachea midline and thyroid normal to inspection and palpation Lungs: clear to auscultation bilaterally Cardiovascular: regular rate and rhythm Breasts: normal appearance, no masses or tenderness Abdomen: soft, non-tender; non distended,  no masses,  no organomegaly Extremities: extremities normal, atraumatic, no cyanosis or edema Skin: Skin color, texture, turgor normal. No rashes or lesions Lymph nodes: Cervical, supraclavicular, and axillary nodes normal. No abnormal inguinal nodes palpated Neurologic: Grossly normal   Pelvic: External genitalia:  no lesions              Urethra:  normal appearing urethra with no masses, tenderness or lesions              Bartholins and Skenes: normal                 Vagina: the pessary was removed and cleaned. No significant vaginal irritation. Mild atrophy. Pessary replaced              Cervix: absent               Bimanual Exam:  Uterus:  uterus absent              Adnexa: no mass, fullness, tenderness                Rectovaginal: Confirms               Anus:  normal sphincter tone, no lesions  Chaperone was present for exam.  A:  Well Woman with normal exam  Cystocele and vault prolapse  Pessary check, doing well  H/O [facility]  P:   No pap needed  Mammogram in 6/20  Colonoscopy UTD  Discussed breast self exam  Discussed calcium and vit D intake  Continue with pessary and vaginal estrogen  F/U in 3 months

## 2018-04-18 NOTE — Telephone Encounter (Signed)
Rx for Estradiol cream 0.02%, use 1/2 gram vaginally 2 x a week at hs, 3 month supply 3RF sent to [facility] on file.

## 2018-05-14 ENCOUNTER — Other Ambulatory Visit: Payer: Self-pay | Admitting: Family Medicine

## 2018-06-13 ENCOUNTER — Other Ambulatory Visit: Payer: Self-pay | Admitting: Family Medicine

## 2018-06-13 DIAGNOSIS — R42 Dizziness and giddiness: Secondary | ICD-10-CM

## 2018-06-21 ENCOUNTER — Other Ambulatory Visit: Payer: Self-pay | Admitting: Family Medicine

## 2018-06-21 DIAGNOSIS — E1165 Type 2 diabetes mellitus with hyperglycemia: Secondary | ICD-10-CM

## 2018-06-21 NOTE — Telephone Encounter (Signed)
Last OV 04/11/2018 Last refill 03/27/2018 #90/0 Next OV 07/10/2018

## 2018-06-23 ENCOUNTER — Other Ambulatory Visit: Payer: Self-pay | Admitting: Family Medicine

## 2018-06-23 DIAGNOSIS — I10 Essential (primary) hypertension: Secondary | ICD-10-CM

## 2018-06-26 ENCOUNTER — Telehealth: Payer: Self-pay

## 2018-06-26 DIAGNOSIS — E1165 Type 2 diabetes mellitus with hyperglycemia: Secondary | ICD-10-CM

## 2018-06-26 NOTE — Telephone Encounter (Signed)
Metformin 750 mg Product is currently not available through mfg - please send alternative and notify patient

## 2018-06-27 NOTE — Telephone Encounter (Signed)
Forwarding to Dr. Juleen China regarding med change request.

## 2018-06-27 NOTE — Telephone Encounter (Signed)
Okay to change to Metformin 500 XR for equivalent dose.

## 2018-06-27 NOTE — Addendum Note (Signed)
Addended by: Jasper Loser on: 06/27/2018 10:28 AM   Modules accepted: Orders

## 2018-06-28 MED ORDER — METFORMIN HCL ER (MOD) 1000 MG PO TB24: ORAL_TABLET | ORAL | 0 refills | Status: DC

## 2018-06-28 NOTE — Telephone Encounter (Signed)
Pt is returning brandy call

## 2018-06-28 NOTE — Telephone Encounter (Signed)
Called pt and left VM to call the office.  

## 2018-06-28 NOTE — Telephone Encounter (Signed)
See note

## 2018-06-28 NOTE — Addendum Note (Signed)
Addended by: Jasper Loser on: 06/28/2018 02:28 PM   Modules accepted: Orders

## 2018-06-28 NOTE — Telephone Encounter (Signed)
Called pt and advised regarding medication change. New rx sent to pharmacy.

## 2018-06-28 NOTE — Addendum Note (Signed)
Addended by: Jasper Loser on: 06/28/2018 08:40 AM   Modules accepted: Orders

## 2018-06-28 NOTE — Telephone Encounter (Signed)
Changing from Metformin ER 750 mg daily with breakfast to  Metformin ER 1000 mg daily with breakfast.

## 2018-07-05 ENCOUNTER — Other Ambulatory Visit: Payer: Self-pay | Admitting: Family Medicine

## 2018-07-05 DIAGNOSIS — J069 Acute upper respiratory infection, unspecified: Secondary | ICD-10-CM

## 2018-07-09 NOTE — Progress Notes (Deleted)
Virtual Visit via Video   Due to the COVID-19 pandemic, this visit was completed with telemedicine (audio/video) technology to reduce patient and provider exposure as well as to preserve personal protective equipment.   I connected with Barron Alvine on 07/09/18 at  2:20 PM EDT by a video enabled telemedicine application and verified that I am speaking with the correct person using two identifiers. Location patient: Home Location provider:  [facility], Office Persons participating in the virtual visit: Myldred, Raju, DO   I discussed the limitations of evaluation and management by telemedicine and the availability of in person appointments. The patient expressed understanding and agreed to proceed.  Care Team   Patient Care Team: Briscoe Deutscher, DO as PCP - General (Family Medicine) Gaynelle Arabian, MD as Consulting Physician (Orthopedic Surgery)  Subjective:   HPI:   ROS   Patient Active Problem List   Diagnosis Date Noted  . Atherosclerotic cardiovascular disease 10 year risk greater than 30% using 2013 ACC/AHA calculator 12/18/2017  . Type 2 diabetes mellitus with hyperglycemia, without long-term current use of insulin ([place]) 06/18/2016  . History of right knee joint replacement 06/17/2016  . History of left hip replacement 06/17/2016  . History of vaginal hysterectomy 06/17/2016  . OA (osteoarthritis) of knee 11/03/2015  . Hyperlipidemia associated with type 2 diabetes mellitus ([place]) 07/23/2014  . Genu valgum 08/11/2012  . Hypertension associated with diabetes ([place]) 02/25/2010    Social History   Tobacco Use  . Smoking status: Never Smoker  . Smokeless tobacco: Never Used  Substance Use Topics  . Alcohol use: No    Current Outpatient Medications:  .  ACCU-CHEK FASTCLIX LANCETS MISC, Check blood sugars 1-2 times per day., Disp: 100 each, Rfl: 2 .  ACCU-CHEK GUIDE test strip, TEST FOUR TIMES DAILY., Disp: 100 each, Rfl: 6 .  aspirin EC 81 MG  tablet, Take 81 mg by mouth daily., Disp: , Rfl:  .  fluticasone (FLONASE) 50 MCG/ACT nasal spray, PLACE 1 SPRAY INTO BOTH NOSTRILS 2 (TWO) TIMES DAILY AS NEEDED FOR ALLERGIES OR RHINITIS., Disp: 16 g, Rfl: 0 .  meclizine (ANTIVERT) 25 MG tablet, TAKE 1 TABLET (25 MG TOTAL) BY MOUTH 3 (THREE) TIMES DAILY AS NEEDED FOR DIZZINESS., Disp: 30 tablet, Rfl: 0 .  metFORMIN (GLUMETZA) 1000 MG (MOD) 24 hr tablet, Take 1 tablet daily with breakfast., Disp: 90 tablet, Rfl: 0 .  NONFORMULARY OR COMPOUNDED ITEM, Estradiol cream 0.02%, use 1/2 a gram vaginally 2 x a week at hs. 3 month supply sent, 3 refills, Disp: 3 each, Rfl: 3 .  phentermine (ADIPEX-P) 37.5 MG tablet, Take 37.5 mg by mouth 2 (two) times daily., Disp: , Rfl:  .  pravastatin (PRAVACHOL) 40 MG tablet, TAKE 1 TABLET BY MOUTH EVERY DAY, Disp: 90 tablet, Rfl: 0 .  traZODone (DESYREL) 50 MG tablet, TAKE 0.5-1 TABLETS BY MOUTH AT BEDTIME AS NEEDED FOR SLEEP., Disp: 90 tablet, Rfl: 2 .  triamterene-hydrochlorothiazide (DYAZIDE) 37.5-25 MG capsule, TAKE 1 CAPSULE EACH BY MOUTH DAILY., Disp: 90 capsule, Rfl: 3  Allergies  Allergen Reactions  . Probiotic [Acidophilus]     Objective:   VITALS: Per patient if applicable, see vitals. GENERAL: Alert, appears well and in no acute distress. HEENT: Atraumatic, conjunctiva clear, no obvious abnormalities on inspection of external nose and ears. NECK: Normal movements of the head and neck. CARDIOPULMONARY: No increased WOB. Speaking in clear sentences. I:E ratio WNL.  MS: Moves all visible extremities without noticeable abnormality. PSYCH: Pleasant  and cooperative, well-groomed. Speech normal rate and rhythm. Affect is appropriate. Insight and judgement are appropriate. Attention is focused, linear, and appropriate.  NEURO: CN grossly intact. Oriented as arrived to appointment on time with no prompting. Moves both UE equally.  SKIN: No obvious lesions, wounds, erythema, or cyanosis noted on face or  hands.  Depression screen [facility] 2/9 04/11/2018 10/21/2017 07/22/2016  Decreased Interest 1 0 0  Down, Depressed, Hopeless 0 0 0  PHQ - 2 Score 1 0 0  Altered sleeping 0 - -  Tired, decreased energy 1 - -  Change in appetite 0 - -  Feeling bad or failure about yourself  1 - -  Trouble concentrating 0 - -  Moving slowly or fidgety/restless 0 - -  Suicidal thoughts 0 - -  PHQ-9 Score 3 - -  Difficult doing work/chores Not difficult at all - -    Assessment and Plan:   There are no diagnoses linked to this encounter.  Marland Kitchen COVID-19 Education:The signs and symptoms of COVID-19 were discussed with the patient and how to seek care for testing if needed. The importance of social distancing was discussed today. . Reviewed expectations re: course of current medical issues. . Discussed self-management of symptoms. . Outlined signs and symptoms indicating need for more acute intervention. . Patient verbalized understanding and all questions were answered. Marland Kitchen Health Maintenance issues including appropriate healthy diet, exercise, and smoking avoidance were discussed with patient. . See orders for this visit as documented in the electronic medical record.  Briscoe Deutscher, DO 07/09/2018  Records requested if needed. Time spent: *** minutes, of which >50% was spent in obtaining information about her symptoms, reviewing her previous labs, evaluations, and treatments, counseling her about her condition (please see the discussed topics above), and developing a plan to further investigate it; she had a number of questions which I addressed.

## 2018-07-10 ENCOUNTER — Ambulatory Visit: Payer: Medicare Other | Admitting: Family Medicine

## 2018-07-16 NOTE — Progress Notes (Signed)
Virtual Visit via Video   Due to the COVID-19 pandemic, this visit was completed with telemedicine (audio/video) technology to reduce patient and provider exposure as well as to preserve personal protective equipment.   I connected with Misty Cervantes by a video enabled telemedicine application and verified that I am speaking with the correct person using two identifiers. Location patient: Home Location provider: [facility], [facility] Persons participating in the virtual visit: Misty Cervantes, [place], DO   I discussed the limitations of evaluation and management by telemedicine and the availability of in person appointments. The patient expressed understanding and agreed to proceed.  Care Team   Patient Care Team: Briscoe Deutscher, DO as PCP - General (Family Medicine) Gaynelle Arabian, MD as Consulting Physician (Orthopedic Surgery)  Subjective:   HPI: Medication compliance: compliant all of the time, diabetic diet compliance: noncompliant some of the time, home glucose monitoring: is performed regularly, further diabetic ROS: no polyuria or polydipsia, no chest pain, dyspnea or TIA's, no numbness, tingling or pain in extremities.  Lab Results  Component Value Date   HGBA1C 7.4 (A) 04/11/2018   HGBA1C 7.2 (H) 11/30/2017   HGBA1C 5.6 06/17/2017   Lab Results  Component Value Date   MICROALBUR 1.2 10/21/2017   LDLCALC 112 (H) 11/30/2017   CREATININE 1.06 11/30/2017   Review of Systems  Constitutional: Negative for chills, fever, malaise/fatigue and weight loss.  Respiratory: Negative for cough, shortness of breath and wheezing.   Cardiovascular: Negative for chest pain, palpitations and leg swelling.  Gastrointestinal: Negative for abdominal pain, constipation, diarrhea, nausea and vomiting.  Genitourinary: Negative for dysuria and urgency.  Musculoskeletal: Negative for joint pain and myalgias.  Skin: Negative for rash.  Neurological: Negative for dizziness and  headaches.  Psychiatric/Behavioral: Negative for depression, substance abuse and suicidal ideas. The patient is not nervous/anxious.     Patient Active Problem List   Diagnosis Date Noted  . Atherosclerotic cardiovascular disease 10 year risk greater than 30% using 2013 ACC/AHA calculator 12/18/2017  . Type 2 diabetes mellitus with hyperglycemia, without long-term current use of insulin ([place]) 06/18/2016  . History of right knee joint replacement 06/17/2016  . History of left hip replacement 06/17/2016  . History of vaginal hysterectomy 06/17/2016  . OA (osteoarthritis) of knee 11/03/2015  . Hyperlipidemia associated with type 2 diabetes mellitus ([place]) 07/23/2014  . Genu valgum 08/11/2012  . Hypertension associated with diabetes ([place]) 02/25/2010    Social History   Tobacco Use  . Smoking status: Never Smoker  . Smokeless tobacco: Never Used  Substance Use Topics  . Alcohol use: No    Current Outpatient Medications:  .  ACCU-CHEK FASTCLIX LANCETS MISC, Check blood sugars 1-2 times per day., Disp: 100 each, Rfl: 2 .  ACCU-CHEK GUIDE test strip, TEST FOUR TIMES DAILY., Disp: 100 each, Rfl: 6 .  aspirin EC 81 MG tablet, Take 81 mg by mouth daily., Disp: , Rfl:  .  fluticasone (FLONASE) 50 MCG/ACT nasal spray, PLACE 1 SPRAY INTO BOTH NOSTRILS 2 (TWO) TIMES DAILY AS NEEDED FOR ALLERGIES OR RHINITIS., Disp: 16 g, Rfl: 0 .  meclizine (ANTIVERT) 25 MG tablet, Take 1 tablet (25 mg total) by mouth 3 (three) times daily as needed for dizziness., Disp: 30 tablet, Rfl: 0 .  metFORMIN (GLUMETZA) 1000 MG (MOD) 24 hr tablet, Take 1 tablet daily with breakfast., Disp: 90 tablet, Rfl: 0 .  NONFORMULARY OR COMPOUNDED ITEM, Estradiol cream 0.02%, use 1/2 a gram vaginally 2 x a week  at hs. 3 month supply sent, 3 refills, Disp: 3 each, Rfl: 3 .  phentermine (ADIPEX-P) 37.5 MG tablet, Take 37.5 mg by mouth 2 (two) times daily., Disp: , Rfl:  .  pravastatin (PRAVACHOL) 40 MG tablet, TAKE 1 TABLET BY MOUTH  EVERY DAY, Disp: 90 tablet, Rfl: 0 .  traZODone (DESYREL) 50 MG tablet, TAKE 0.5-1 TABLETS BY MOUTH AT BEDTIME AS NEEDED FOR SLEEP., Disp: 90 tablet, Rfl: 2 .  triamterene-hydrochlorothiazide (DYAZIDE) 37.5-25 MG capsule, TAKE 1 CAPSULE EACH BY MOUTH DAILY., Disp: 90 capsule, Rfl: 3  Allergies  Allergen Reactions  . Probiotic [Acidophilus]     Objective:   VITALS: Per patient if applicable, see vitals. GENERAL: Alert, appears well and in no acute distress. HEENT: Atraumatic, conjunctiva clear, no obvious abnormalities on inspection of external nose and ears. NECK: Normal movements of the head and neck. CARDIOPULMONARY: No increased WOB. Speaking in clear sentences. I:E ratio WNL.  MS: Moves all visible extremities without noticeable abnormality. PSYCH: Pleasant and cooperative, well-groomed. Speech normal rate and rhythm. Affect is appropriate. Insight and judgement are appropriate. Attention is focused, linear, and appropriate.  NEURO: CN grossly intact. Oriented as arrived to appointment on time with no prompting. Moves both UE equally.  SKIN: No obvious lesions, wounds, erythema, or cyanosis noted on face or hands.  Depression screen [facility] 2/9 04/11/2018 10/21/2017 07/22/2016  Decreased Interest 1 0 0  Down, Depressed, Hopeless 0 0 0  PHQ - 2 Score 1 0 0  Altered sleeping 0 - -  Tired, decreased energy 1 - -  Change in appetite 0 - -  Feeling bad or failure about yourself  1 - -  Trouble concentrating 0 - -  Moving slowly or fidgety/restless 0 - -  Suicidal thoughts 0 - -  PHQ-9 Score 3 - -  Difficult doing work/chores Not difficult at all - -    Assessment and Plan:   Misty Cervantes was seen today for follow-up.  Diagnoses and all orders for this visit:  Hypertension associated with diabetes ([place]) Well controlled.  No signs of complications, medication side effects, or red flags.  Continue current regimen.    Hyperlipidemia associated with type 2 diabetes mellitus ([place]) Well  controlled.  No signs of complications, medication side effects, or red flags.  Continue current regimen.    Type 2 diabetes mellitus with hyperglycemia, without long-term current use of insulin ([facility]) Well controlled.  No signs of complications, medication side effects, or red flags.  Continue current regimen.    Marland Kitchen COVID-19 Education: The signs and symptoms of COVID-19 were discussed with the patient and how to seek care for testing if needed. The importance of social distancing was discussed today. . Reviewed expectations re: course of current medical issues. . Discussed self-management of symptoms. . Outlined signs and symptoms indicating need for more acute intervention. . Patient verbalized understanding and all questions were answered. Marland Kitchen Health Maintenance issues including appropriate healthy diet, exercise, and smoking avoidance were discussed with patient. . See orders for this visit as documented in the electronic medical record.  Briscoe Deutscher, DO  Records requested if needed. Time spent: 25 minutes, of which >50% was spent in obtaining information about her symptoms, reviewing her previous labs, evaluations, and treatments, counseling her about her condition (please see the discussed topics above), and developing a plan to further investigate it; she had a number of questions which I addressed.

## 2018-07-17 ENCOUNTER — Other Ambulatory Visit: Payer: Self-pay

## 2018-07-17 ENCOUNTER — Ambulatory Visit (INDEPENDENT_AMBULATORY_CARE_PROVIDER_SITE_OTHER): Payer: Medicare Other | Admitting: Family Medicine

## 2018-07-17 ENCOUNTER — Encounter: Payer: Self-pay | Admitting: Family Medicine

## 2018-07-17 DIAGNOSIS — I152 Hypertension secondary to endocrine disorders: Secondary | ICD-10-CM

## 2018-07-17 DIAGNOSIS — I10 Essential (primary) hypertension: Secondary | ICD-10-CM

## 2018-07-17 DIAGNOSIS — E1169 Type 2 diabetes mellitus with other specified complication: Secondary | ICD-10-CM | POA: Diagnosis not present

## 2018-07-17 DIAGNOSIS — E1165 Type 2 diabetes mellitus with hyperglycemia: Secondary | ICD-10-CM | POA: Diagnosis not present

## 2018-07-17 DIAGNOSIS — E785 Hyperlipidemia, unspecified: Secondary | ICD-10-CM

## 2018-07-17 DIAGNOSIS — R42 Dizziness and giddiness: Secondary | ICD-10-CM

## 2018-07-17 DIAGNOSIS — E1159 Type 2 diabetes mellitus with other circulatory complications: Secondary | ICD-10-CM | POA: Diagnosis not present

## 2018-07-17 MED ORDER — MECLIZINE HCL 25 MG PO TABS: 25.0000 mg | ORAL_TABLET | Freq: Three times a day (TID) | ORAL | 0 refills | Status: DC | PRN

## 2018-07-19 ENCOUNTER — Telehealth: Payer: Self-pay | Admitting: Obstetrics and Gynecology

## 2018-07-19 ENCOUNTER — Ambulatory Visit: Payer: Medicare Other | Admitting: Obstetrics and Gynecology

## 2018-07-19 NOTE — Telephone Encounter (Signed)
Patient canceled her 3 month pessary check 07/19/18 via the automated reminder call . I left a message for her to call and reschedule.

## 2018-07-30 ENCOUNTER — Other Ambulatory Visit: Payer: Self-pay | Admitting: Family Medicine

## 2018-07-30 DIAGNOSIS — J069 Acute upper respiratory infection, unspecified: Secondary | ICD-10-CM

## 2018-08-02 ENCOUNTER — Other Ambulatory Visit: Payer: Self-pay | Admitting: Family Medicine

## 2018-08-02 DIAGNOSIS — E1165 Type 2 diabetes mellitus with hyperglycemia: Secondary | ICD-10-CM

## 2018-08-08 ENCOUNTER — Other Ambulatory Visit: Payer: Self-pay | Admitting: Family Medicine

## 2018-08-18 ENCOUNTER — Other Ambulatory Visit: Payer: Self-pay | Admitting: Family Medicine

## 2018-08-18 DIAGNOSIS — R42 Dizziness and giddiness: Secondary | ICD-10-CM

## 2018-08-31 ENCOUNTER — Telehealth: Payer: Self-pay | Admitting: Physical Therapy

## 2018-08-31 NOTE — Telephone Encounter (Signed)
Copied from [place] [id number]. Topic: General - Other >> Aug 31, 2018  3:39 PM Leward Quan A wrote: Reason for CRM: Patient called to say that she received a letter from [facility] saying that the metFORMIN (GLUMETZA) 1000 MG (MOD) 24 hr tablet has been recalled and she need to get something else. Patient is awaiting a call back at Ph# [phone]

## 2018-09-04 ENCOUNTER — Other Ambulatory Visit: Payer: Self-pay

## 2018-09-04 MED ORDER — METFORMIN HCL 850 MG PO TABS: 850.0000 mg | ORAL_TABLET | Freq: Two times a day (BID) | ORAL | 0 refills | Status: DC

## 2018-09-04 NOTE — Telephone Encounter (Signed)
I spoke with patient and informed her of the new protocol for our office concerning Metformin.  New script sent to pharmacy.

## 2018-09-05 ENCOUNTER — Telehealth: Payer: Self-pay | Admitting: Family Medicine

## 2018-09-05 NOTE — Telephone Encounter (Signed)
Called patient back reviewed all questions with patient she will call back if any further questions.

## 2018-09-05 NOTE — Telephone Encounter (Signed)
Pt was taking 750 MG Metformin 1x a day and now was sent in 850MG  to take 2x a day with meals/ Pt wants to know if she should be taking 1700Mg  a day coming from only taking 750Mg  / please advise for clarity

## 2018-09-05 NOTE — Telephone Encounter (Signed)
See note

## 2018-09-18 ENCOUNTER — Other Ambulatory Visit: Payer: Self-pay | Admitting: Family Medicine

## 2018-09-18 DIAGNOSIS — R42 Dizziness and giddiness: Secondary | ICD-10-CM

## 2018-09-27 ENCOUNTER — Other Ambulatory Visit: Payer: Self-pay | Admitting: Family Medicine

## 2018-10-09 NOTE — Telephone Encounter (Signed)
Okay to decrease to 500 mg dose BID. Have her come in for labs - CMP.

## 2018-10-09 NOTE — Telephone Encounter (Signed)
Pt states since she started the metFORMIN (GLUCOPHAGE) 850 MG tablet, she has not felt good.  Pt has no energy, fatigue, nausea.  Pt is concerned this may not be the right strength for her. Pt is taking 2 /850 mg.  But was only taking one of the 750 mg prior to recall. This had been an question since June, but pt did not have these other symptoms at that time.    Would like call back to discuss

## 2018-10-09 NOTE — Telephone Encounter (Signed)
Please advise 

## 2018-10-10 NOTE — Telephone Encounter (Signed)
Pt is returning joellen call

## 2018-10-10 NOTE — Telephone Encounter (Signed)
Patient was not willing to decrease the metformin she will d/c and we have made f/u to come in the office.

## 2018-10-10 NOTE — Telephone Encounter (Signed)
Left message to return call to our office.  

## 2018-10-10 NOTE — Telephone Encounter (Signed)
Patient called in stating she is still waiting for a call back from Ryan. Please advise as she has questions about her medication. Call back is [phone].

## 2018-10-17 ENCOUNTER — Encounter: Payer: Self-pay | Admitting: Family Medicine

## 2018-10-17 ENCOUNTER — Other Ambulatory Visit: Payer: Self-pay | Admitting: Family Medicine

## 2018-10-17 ENCOUNTER — Other Ambulatory Visit: Payer: Self-pay

## 2018-10-17 ENCOUNTER — Ambulatory Visit (INDEPENDENT_AMBULATORY_CARE_PROVIDER_SITE_OTHER): Payer: Medicare Other | Admitting: Family Medicine

## 2018-10-17 VITALS — BP 120/82 | HR 94 | Temp 97.8°F | Ht 59.5 in | Wt 148.0 lb

## 2018-10-17 DIAGNOSIS — Z9189 Other specified personal risk factors, not elsewhere classified: Secondary | ICD-10-CM

## 2018-10-17 DIAGNOSIS — Z79899 Other long term (current) drug therapy: Secondary | ICD-10-CM | POA: Diagnosis not present

## 2018-10-17 DIAGNOSIS — E1169 Type 2 diabetes mellitus with other specified complication: Secondary | ICD-10-CM

## 2018-10-17 DIAGNOSIS — E1159 Type 2 diabetes mellitus with other circulatory complications: Secondary | ICD-10-CM

## 2018-10-17 DIAGNOSIS — E785 Hyperlipidemia, unspecified: Secondary | ICD-10-CM | POA: Diagnosis not present

## 2018-10-17 DIAGNOSIS — I10 Essential (primary) hypertension: Secondary | ICD-10-CM

## 2018-10-17 DIAGNOSIS — I152 Hypertension secondary to endocrine disorders: Secondary | ICD-10-CM

## 2018-10-17 DIAGNOSIS — E1165 Type 2 diabetes mellitus with hyperglycemia: Secondary | ICD-10-CM | POA: Diagnosis not present

## 2018-10-17 DIAGNOSIS — Z1231 Encounter for screening mammogram for malignant neoplasm of breast: Secondary | ICD-10-CM

## 2018-10-17 DIAGNOSIS — R42 Dizziness and giddiness: Secondary | ICD-10-CM

## 2018-10-17 LAB — CBC WITH DIFFERENTIAL/PLATELET
Basophils Absolute: 0 10*3/uL (ref 0.0–0.1)
Basophils Relative: 0.9 % (ref 0.0–3.0)
Eosinophils Absolute: 0.1 10*3/uL (ref 0.0–0.7)
Eosinophils Relative: 2.4 % (ref 0.0–5.0)
HCT: 39.6 % (ref 36.0–46.0)
Hemoglobin: 13.3 g/dL (ref 12.0–15.0)
Lymphocytes Relative: 27.3 % (ref 12.0–46.0)
Lymphs Abs: 1 10*3/uL (ref 0.7–4.0)
MCHC: 33.6 g/dL (ref 30.0–36.0)
MCV: 92.8 fl (ref 78.0–100.0)
Monocytes Absolute: 0.3 10*3/uL (ref 0.1–1.0)
Monocytes Relative: 8.2 % (ref 3.0–12.0)
Neutro Abs: 2.2 10*3/uL (ref 1.4–7.7)
Neutrophils Relative %: 61.2 % (ref 43.0–77.0)
Platelets: 222 10*3/uL (ref 150.0–400.0)
RBC: 4.27 Mil/uL (ref 3.87–5.11)
RDW: 13.4 % (ref 11.5–15.5)
WBC: 3.6 10*3/uL — ABNORMAL LOW (ref 4.0–10.5)

## 2018-10-17 LAB — LIPID PANEL
Cholesterol: 174 mg/dL (ref 0–200)
HDL: 43.6 mg/dL (ref >39.00)
LDL Cholesterol: 103 mg/dL — ABNORMAL HIGH (ref 0–99)
NonHDL: 130.3
Total CHOL/HDL Ratio: 4
Triglycerides: 137 mg/dL (ref 0.0–149.0)
VLDL: 27.4 mg/dL (ref 0.0–40.0)

## 2018-10-17 LAB — COMPREHENSIVE METABOLIC PANEL
ALT: 22 U/L (ref 0–35)
AST: 19 U/L (ref 0–37)
Albumin: 4.2 g/dL (ref 3.5–5.2)
Alkaline Phosphatase: 73 U/L (ref 39–117)
BUN: 14 mg/dL (ref 6–23)
CO2: 30 mEq/L (ref 19–32)
Calcium: 9.4 mg/dL (ref 8.4–10.5)
Chloride: 101 mEq/L (ref 96–112)
Creatinine, Ser: 1.06 mg/dL (ref 0.40–1.20)
GFR: 50.7 mL/min — ABNORMAL LOW (ref >60.00)
Glucose, Bld: 157 mg/dL — ABNORMAL HIGH (ref 70–99)
Potassium: 3.7 mEq/L (ref 3.5–5.1)
Sodium: 138 mEq/L (ref 135–145)
Total Bilirubin: 0.8 mg/dL (ref 0.2–1.2)
Total Protein: 6.4 g/dL (ref 6.0–8.3)

## 2018-10-17 LAB — HEMOGLOBIN A1C: Hgb A1c MFr Bld: 7.9 % — ABNORMAL HIGH (ref 4.6–6.5)

## 2018-10-17 LAB — VITAMIN B12: Vitamin B-12: 196 pg/mL — ABNORMAL LOW (ref 211–911)

## 2018-10-17 NOTE — Progress Notes (Signed)
Misty Cervantes is a 73 y.o. female is here for follow up.  History of Present Illness:   Lonell Grandchild, CMA acting as scribe for Dr. Briscoe Deutscher.   HPI: Patient states that after starting new dose of metformin she started having some extreme nausea and vomiting. She stopped taking a week ago and has not had any symptoms.    She is watching her food intake.  She is still enjoying some sweets but make sure to keep her blood sugars between 70 and 170.  Weight is stable.  She still exercising.  Sleeping has improved with use of half tab trazodone at night as needed.  She has increased her water intake to 4-5 8 ounce drinks per day.  Cardiovascular ROS: negative for - chest pain, dyspnea on exertion, edema or palpitations.  Health Maintenance Due  Topic Date Due  . TETANUS/TDAP  12/31/1964  . OPHTHALMOLOGY EXAM  08/06/2017  . FOOT EXAM  06/18/2018  . HEMOGLOBIN A1C  10/10/2018  . INFLUENZA VACCINE  10/14/2018  . URINE MICROALBUMIN  10/22/2018   Depression screen [facility] 2/9 04/11/2018 10/21/2017 07/22/2016  Decreased Interest 1 0 0  Down, Depressed, Hopeless 0 0 0  PHQ - 2 Score 1 0 0  Altered sleeping 0 - -  Tired, decreased energy 1 - -  Change in appetite 0 - -  Feeling bad or failure about yourself  1 - -  Trouble concentrating 0 - -  Moving slowly or fidgety/restless 0 - -  Suicidal thoughts 0 - -  PHQ-9 Score 3 - -  Difficult doing work/chores Not difficult at all - -   PMHx, SurgHx, SocialHx, FamHx, Medications, and Allergies were reviewed in the Visit Navigator and updated as appropriate.   Patient Active Problem List   Diagnosis Date Noted  . Atherosclerotic cardiovascular disease 10 year risk greater than 30% using 2013 ACC/AHA calculator 12/18/2017  . Type 2 diabetes mellitus with hyperglycemia, without long-term current use of insulin ([place]) 06/18/2016  . History of right knee joint replacement 06/17/2016  . History of left hip replacement 06/17/2016  . History of  vaginal hysterectomy 06/17/2016  . OA (osteoarthritis) of knee 11/03/2015  . Hyperlipidemia associated with type 2 diabetes mellitus ([place]) 07/23/2014  . Genu valgum 08/11/2012  . Hypertension associated with diabetes ([place]) 02/25/2010   Social History   Tobacco Use  . Smoking status: Never Smoker  . Smokeless tobacco: Never Used  Substance Use Topics  . Alcohol use: No  . Drug use: No   Current Medications and Allergies   Current Outpatient Medications:  .  ACCU-CHEK FASTCLIX LANCETS MISC, Check blood sugars 1-2 times per day., Disp: 100 each, Rfl: 2 .  ACCU-CHEK GUIDE test strip, TEST FOUR TIMES DAILY., Disp: 100 each, Rfl: 6 .  aspirin EC 81 MG tablet, Take 81 mg by mouth daily., Disp: , Rfl:  .  fluticasone (FLONASE) 50 MCG/ACT nasal spray, PLACE 1 SPRAY INTO BOTH NOSTRILS 2 (TWO) TIMES DAILY AS NEEDED FOR ALLERGIES OR RHINITIS., Disp: 16 g, Rfl: 3 .  meclizine (ANTIVERT) 25 MG tablet, TAKE 1 TABLET BY MOUTH 3 TIMES A DAY AS NEEDED FOR DIZZINESS, Disp: 30 tablet, Rfl: 0 .  NONFORMULARY OR COMPOUNDED ITEM, Estradiol cream 0.02%, use 1/2 a gram vaginally 2 x a week at hs. 3 month supply sent, 3 refills, Disp: 3 each, Rfl: 3 .  phentermine (ADIPEX-P) 37.5 MG tablet, Take 37.5 mg by mouth 2 (two) times daily., Disp: , Rfl:  .  pravastatin (PRAVACHOL) 40 MG tablet, TAKE 1 TABLET BY MOUTH EVERY DAY, Disp: 90 tablet, Rfl: 0 .  traZODone (DESYREL) 50 MG tablet, TAKE 0.5-1 TABLETS BY MOUTH AT BEDTIME AS NEEDED FOR SLEEP., Disp: 90 tablet, Rfl: 2 .  triamterene-hydrochlorothiazide (DYAZIDE) 37.5-25 MG capsule, TAKE 1 CAPSULE EACH BY MOUTH DAILY., Disp: 90 capsule, Rfl: 3   Allergies  Allergen Reactions  . Probiotic [Acidophilus]    Review of Systems   Pertinent items are noted in the HPI. Otherwise, a complete ROS is negative.  Vitals   Vitals:   10/17/18 0909  BP: 120/82  Pulse: 94  Temp: 97.8 F (36.6 C)  TempSrc: Temporal  Weight: 148 lb (67.1 kg)  Height: 4' 11.5" (1.511  m)     Body mass index is 29.39 kg/m.  Physical Exam   Physical Exam Vitals signs and nursing note reviewed.  HENT:     Head: Normocephalic and atraumatic.  Eyes:     Pupils: Pupils are equal, round, and reactive to light.  Neck:     Musculoskeletal: Normal range of motion and neck supple.  Cardiovascular:     Rate and Rhythm: Normal rate and regular rhythm.     Heart sounds: Normal heart sounds.  Pulmonary:     Effort: Pulmonary effort is normal.  Abdominal:     Palpations: Abdomen is soft.  Skin:    General: Skin is warm.  Psychiatric:        Behavior: Behavior normal.    Assessment and Plan   Misty Cervantes was seen today for follow-up.  Diagnoses and all orders for this visit:  Type 2 diabetes mellitus with hyperglycemia, without long-term current use of insulin (HCC) -     CBC with Differential/Platelet -     Comprehensive metabolic panel -     Hemoglobin A1c  Atherosclerotic cardiovascular disease 10 year risk greater than 30% using 2013 ACC/AHA calculator  Hyperlipidemia associated with type 2 diabetes mellitus ([place]) -     Lipid panel  Hypertension associated with diabetes ([place])  Medication management -     Vitamin B12    . Orders and follow up as documented in [place], reviewed diet, exercise and weight control, cardiovascular risk and specific lipid/LDL goals reviewed, reviewed medications and side effects in detail.  . Reviewed expectations re: course of current medical issues. . Outlined signs and symptoms indicating need for more acute intervention. . Patient verbalized understanding and all questions were answered. . Patient received an After Visit Summary.  CMA served as Education administrator during this visit. History, Physical, and Plan performed by medical provider. The above documentation has been reviewed and is accurate and complete. Briscoe Deutscher, D.O.  Briscoe Deutscher, DO [place], [place] 10/17/2018

## 2018-10-18 ENCOUNTER — Telehealth: Payer: Self-pay | Admitting: Family Medicine

## 2018-10-18 NOTE — Telephone Encounter (Signed)
Copied from [place] [id number]. Topic: General - Other >> Oct 18, 2018  4:36 PM Wynetta Emery, [place] C wrote: Reason for CRM: pt called in to speak with providers assist. Pt says that they discussed her lab results however pt still has a concern.   Please call pt back at: [phone]

## 2018-10-19 NOTE — Telephone Encounter (Signed)
Called patient answered all questions. Will call back if any changes.

## 2018-10-23 ENCOUNTER — Ambulatory Visit: Payer: Medicare Other

## 2018-10-31 ENCOUNTER — Other Ambulatory Visit: Payer: Self-pay | Admitting: Family Medicine

## 2018-10-31 NOTE — Telephone Encounter (Signed)
Last fill 08/08/18  #90/0 Last OV 10/17/18

## 2018-11-17 ENCOUNTER — Other Ambulatory Visit: Payer: Self-pay | Admitting: Family Medicine

## 2018-11-17 DIAGNOSIS — R42 Dizziness and giddiness: Secondary | ICD-10-CM

## 2018-12-04 ENCOUNTER — Other Ambulatory Visit: Payer: Self-pay

## 2018-12-04 ENCOUNTER — Ambulatory Visit
Admission: RE | Admit: 2018-12-04 | Discharge: 2018-12-04 | Disposition: A | Discharge status: Home / Self Care | Payer: Medicare Other | Source: Ambulatory Visit | Attending: Family Medicine | Admitting: Family Medicine

## 2018-12-04 DIAGNOSIS — Z1231 Encounter for screening mammogram for malignant neoplasm of breast: Secondary | ICD-10-CM | POA: Diagnosis not present

## 2018-12-07 DIAGNOSIS — M25562 Pain in left knee: Secondary | ICD-10-CM | POA: Diagnosis not present

## 2018-12-07 DIAGNOSIS — M1712 Unilateral primary osteoarthritis, left knee: Secondary | ICD-10-CM | POA: Diagnosis not present

## 2018-12-07 DIAGNOSIS — Z471 Aftercare following joint replacement surgery: Secondary | ICD-10-CM | POA: Diagnosis not present

## 2018-12-07 DIAGNOSIS — Z96651 Presence of right artificial knee joint: Secondary | ICD-10-CM | POA: Diagnosis not present

## 2018-12-11 ENCOUNTER — Other Ambulatory Visit: Payer: Self-pay

## 2018-12-11 NOTE — Patient Outreach (Signed)
[facility]) Care Management  12/11/2018  DERIKA GUERRIERO 1945-08-11 [id number]   Medication Adherence call to Mrs. Zophia Dress left a message with patient husband to call back patient is past due on Pravastatin 40 mg under [place].   [employer] Management Direct Dial [phone]  Fax [phone] [email or web address]

## 2018-12-12 ENCOUNTER — Ambulatory Visit (INDEPENDENT_AMBULATORY_CARE_PROVIDER_SITE_OTHER): Payer: Medicare Other | Admitting: Obstetrics and Gynecology

## 2018-12-12 ENCOUNTER — Encounter: Payer: Self-pay | Admitting: Obstetrics and Gynecology

## 2018-12-12 ENCOUNTER — Other Ambulatory Visit: Payer: Self-pay

## 2018-12-12 VITALS — BP 134/78 | HR 88 | Temp 97.0°F | Wt 147.4 lb

## 2018-12-12 DIAGNOSIS — N8111 Cystocele, midline: Secondary | ICD-10-CM

## 2018-12-12 DIAGNOSIS — Z4689 Encounter for fitting and adjustment of other specified devices: Secondary | ICD-10-CM | POA: Diagnosis not present

## 2018-12-12 DIAGNOSIS — N952 Postmenopausal atrophic vaginitis: Secondary | ICD-10-CM

## 2018-12-12 DIAGNOSIS — N819 Female genital prolapse, unspecified: Secondary | ICD-10-CM

## 2018-12-12 NOTE — Progress Notes (Signed)
GYNECOLOGY  VISIT   HPI: 73 y.o.   Divorced White or Caucasian Not Hispanic or Latino  female   [phone] with No LMP recorded. Patient has had a hysterectomy.   here for pessary check.   H/O TVH. She has a grade 3 cystocele and grade 1 vault prolapse. Uses her pessary intermittently, had irritation with continued use. She is using her pessary ~2-3 days a week, takes it out at night. Occasionally leaves it in for 2 days. Using the vaginal estrogen 2 x weeks.  She has urge incontinence, worse in the am when she first gets up. Not leaking much during the day.    GYNECOLOGIC HISTORY: No LMP recorded. Patient has had a hysterectomy. Contraception: Hysterectomy Menopausal hormone therapy: Compounded vaginal estrogen        OB History    Gravida  2   Para  2   Term  2   Preterm      AB      Living  1     SAB      TAB      Ectopic      Multiple      Live Births  2              Patient Active Problem List   Diagnosis Date Noted  . Atherosclerotic cardiovascular disease 10 year risk greater than 30% using 2013 ACC/AHA calculator 12/18/2017  . Type 2 diabetes mellitus with hyperglycemia, without long-term current use of insulin ([place]) 06/18/2016  . History of right knee joint replacement 06/17/2016  . History of left hip replacement 06/17/2016  . History of vaginal hysterectomy 06/17/2016  . OA (osteoarthritis) of knee 11/03/2015  . Hyperlipidemia associated with type 2 diabetes mellitus ([place]) 07/23/2014  . Genu valgum 08/11/2012  . Hypertension associated with diabetes ([place]) 02/25/2010    Past Medical History:  Diagnosis Date  . Anemia   . Cataract   . Diabetes mellitus without complication ([place])   . Gastric ulcer 11/17/2011  . History of total left hip replacement 2011  . HTN (hypertension)   . Hx of total knee replacement 2017   11-03-2015   . Hyperlipemia   . Hyperplastic polyp of intestine 11/17/2011  . Hypertension   . Measles   . Menopause   . Migraine    . Osteoarthritis     Past Surgical History:  Procedure Laterality Date  . CARPAL TUNNEL RELEASE Right   . CATARACT EXTRACTION Bilateral   . HIP ARTHROPLASTY Left 2011  . JOINT REPLACEMENT    . RETINAL DETACHMENT SURGERY    . TOTAL KNEE ARTHROPLASTY Right 11/03/2015   Procedure: RIGHT TOTAL KNEE ARTHROPLASTY;  Surgeon: Gaynelle Arabian, MD;  Location: [facility];  Service: Orthopedics;  Laterality: Right;  . TOTAL VAGINAL HYSTERECTOMY  2012   TVH, anterior repair, TVT (no mesh used for the anterior repair)    Current Outpatient Medications  Medication Sig Dispense Refill  . ACCU-CHEK FASTCLIX LANCETS MISC Check blood sugars 1-2 times per day. 100 each 2  . ACCU-CHEK GUIDE test strip TEST FOUR TIMES DAILY. 100 each 6  . aspirin EC 81 MG tablet Take 81 mg by mouth daily.    . fluticasone (FLONASE) 50 MCG/ACT nasal spray PLACE 1 SPRAY INTO BOTH NOSTRILS 2 (TWO) TIMES DAILY AS NEEDED FOR ALLERGIES OR RHINITIS. 16 g 3  . meclizine (ANTIVERT) 25 MG tablet TAKE 1 TABLET BY MOUTH THREE TIMES A DAY AS NEEDED FOR DIZZINESS 30 tablet 0  .  NONFORMULARY OR COMPOUNDED ITEM Estradiol cream 0.02%, use 1/2 a gram vaginally 2 x a week at hs. 3 month supply sent, 3 refills 3 each 3  . phentermine (ADIPEX-P) 37.5 MG tablet Take 37.5 mg by mouth 2 (two) times daily.    . pravastatin (PRAVACHOL) 40 MG tablet TAKE 1 TABLET BY MOUTH EVERY DAY 90 tablet 0  . traZODone (DESYREL) 50 MG tablet TAKE 0.5-1 TABLETS BY MOUTH AT BEDTIME AS NEEDED FOR SLEEP. 90 tablet 2  . triamterene-hydrochlorothiazide (DYAZIDE) 37.5-25 MG capsule TAKE 1 CAPSULE EACH BY MOUTH DAILY. 90 capsule 3   No current facility-administered medications for this visit.      ALLERGIES: Probiotic [acidophilus]  Family History  Problem Relation Age of Onset  . Diabetes Mother        deceased  . Heart attack Mother   . Diabetes Sister        x2  . Kidney cancer Father   . Heart attack Father   . Diabetes Sister     Social History    Socioeconomic History  . Marital status: Divorced    Spouse name: Not on file  . Number of children: Not on file  . Years of education: Not on file  . Highest education level: Not on file  Occupational History  . Occupation: retired    Fish farm manager: RETIRED  Social Needs  . Financial resource strain: Not on file  . Food insecurity    Worry: Not on file    Inability: Not on file  . Transportation needs    Medical: Not on file    Non-medical: Not on file  Tobacco Use  . Smoking status: Never Smoker  . Smokeless tobacco: Never Used  Substance and Sexual Activity  . Alcohol use: No  . Drug use: No  . Sexual activity: Not Currently    Partners: Male    Birth control/protection: Post-menopausal, Surgical  Lifestyle  . Physical activity    Days per week: Not on file    Minutes per session: Not on file  . Stress: Not on file  Relationships  . Social Herbalist on phone: Not on file    Gets together: Not on file    Attends religious service: Not on file    Active member of club or organization: Not on file    Attends meetings of clubs or organizations: Not on file    Relationship status: Not on file  . Intimate partner violence    Fear of current or ex partner: Not on file    Emotionally abused: Not on file    Physically abused: Not on file    Forced sexual activity: Not on file  Other Topics Concern  . Not on file  Social History Narrative   Divorced, 2 children; retired.     Review of Systems  Constitutional: Negative.   HENT: Negative.   Eyes: Negative.   Respiratory: Negative.   Cardiovascular: Negative.   Gastrointestinal: Negative.   Genitourinary: Negative.   Musculoskeletal: Negative.   Skin: Negative.   Neurological: Negative.   Endo/Heme/Allergies: Negative.   Psychiatric/Behavioral: Negative.     PHYSICAL EXAMINATION:    BP 134/78 (BP Location: Right Arm, Patient Position: Sitting, Cuff Size: Normal)   Pulse 88   Temp (!) 97 F (36.1  C) (Skin)   Wt 147 lb 6.4 oz (66.9 kg)   BMI 29.27 kg/m     General appearance: alert, cooperative and appears stated age  Pelvic: External  genitalia:  no lesions              Urethra:  normal appearing urethra with no masses, tenderness or lesions              Bartholins and Skenes: normal                 Vagina: the pessary was removed and cleaned, minimal erythema at the vaginal apex. Pessary replaced.               Cervix: absent              Chaperone was present for exam.  ASSESSMENT Cystocele and vault prolapse controlled with the pessary. She uses the pessary intermittently secondary to vaginal irritation. Doing well. Vaginal atrophy, helped with vaginal estrogen    PLAN Continue with intermittent use of the pessary Refill vaginal estrogen   An After Visit Summary was printed and given to the patient.  ~15 minutes face to face time of which over 50% was spent in counseling.

## 2018-12-20 ENCOUNTER — Other Ambulatory Visit: Payer: Self-pay | Admitting: Family Medicine

## 2018-12-20 DIAGNOSIS — R42 Dizziness and giddiness: Secondary | ICD-10-CM

## 2018-12-21 ENCOUNTER — Telehealth: Payer: Self-pay | Admitting: Obstetrics and Gynecology

## 2018-12-21 NOTE — Telephone Encounter (Signed)
Patient returning call to [facility] [place]. Notified patient 5 refills on file at pharmacy. Patient states she spoke with pharmacy today and they told her the prescription they have on file is out of date. She is returning call to pharmacy.

## 2018-12-21 NOTE — Telephone Encounter (Signed)
Left message to call Sharee Pimple, RN at [facility].      Per review of Epic, RX for compounded vaginal estrogen cream sent to [facility] on 04/18/18, 3 mo supply with 3 refills.   Call placed to [facility], spoke with Ronalee Belts, confirmed 5 refills on file for compounded vaginal estrogen cream. Patient has been filing as month supply.

## 2018-12-21 NOTE — Telephone Encounter (Signed)
Patient calling to check on medication that was supposed to be called in to [facility] that needed to be compounded.

## 2018-12-21 NOTE — Telephone Encounter (Signed)
Spoke with patient, advised per pharmacy. Patient to f/u with pharmacy for filling vaginal estrogen cream, will return call to office if any additional assistance needed.   Routing to provider for final review. Patient is agreeable to disposition. Will close encounter.

## 2018-12-25 ENCOUNTER — Other Ambulatory Visit: Payer: Self-pay

## 2018-12-25 NOTE — Patient Outreach (Signed)
[facility]) Care Management  12/25/2018  TAKAIYA SMOLEN [birth year] [id number]   Medication Adherence call to Mrs. Caydee Boschee [facility] Identifiers Verify spoke with patient she is past due on Pravastatin 40 mg, she explain she take 1 tablet daily, patient has a pill box she use on a weekly basis she said she has enough for another week, patient ask if we can call [facility] to order this medication pharmacy will have ready for patient. Mrs. Leverone is showing past due under [place].   [facility] Management Direct Dial [phone]  Fax [phone] [email or web address]

## 2019-01-04 DIAGNOSIS — M25562 Pain in left knee: Secondary | ICD-10-CM | POA: Diagnosis not present

## 2019-01-04 DIAGNOSIS — M1712 Unilateral primary osteoarthritis, left knee: Secondary | ICD-10-CM | POA: Diagnosis not present

## 2019-01-04 DIAGNOSIS — Z471 Aftercare following joint replacement surgery: Secondary | ICD-10-CM | POA: Diagnosis not present

## 2019-01-04 DIAGNOSIS — Z96651 Presence of right artificial knee joint: Secondary | ICD-10-CM | POA: Diagnosis not present

## 2019-01-17 ENCOUNTER — Ambulatory Visit: Payer: Medicare Other | Admitting: Family Medicine

## 2019-01-19 ENCOUNTER — Other Ambulatory Visit: Payer: Self-pay | Admitting: Family Medicine

## 2019-01-19 DIAGNOSIS — J069 Acute upper respiratory infection, unspecified: Secondary | ICD-10-CM

## 2019-01-19 NOTE — Telephone Encounter (Signed)
Rx refilled x 30 days.  Please contact pt to schedule visit to establish with new PCP, thanks!

## 2019-01-23 ENCOUNTER — Other Ambulatory Visit: Payer: Self-pay | Admitting: Family Medicine

## 2019-01-23 DIAGNOSIS — R42 Dizziness and giddiness: Secondary | ICD-10-CM

## 2019-01-25 ENCOUNTER — Ambulatory Visit (INDEPENDENT_AMBULATORY_CARE_PROVIDER_SITE_OTHER): Payer: Medicare Other | Admitting: Family Medicine

## 2019-01-25 ENCOUNTER — Encounter: Payer: Self-pay | Admitting: Family Medicine

## 2019-01-25 VITALS — Ht 59.5 in | Wt 145.0 lb

## 2019-01-25 DIAGNOSIS — I10 Essential (primary) hypertension: Secondary | ICD-10-CM | POA: Diagnosis not present

## 2019-01-25 DIAGNOSIS — F5101 Primary insomnia: Secondary | ICD-10-CM | POA: Diagnosis not present

## 2019-01-25 DIAGNOSIS — E1165 Type 2 diabetes mellitus with hyperglycemia: Secondary | ICD-10-CM

## 2019-01-25 DIAGNOSIS — E1169 Type 2 diabetes mellitus with other specified complication: Secondary | ICD-10-CM

## 2019-01-25 DIAGNOSIS — J069 Acute upper respiratory infection, unspecified: Secondary | ICD-10-CM

## 2019-01-25 DIAGNOSIS — I152 Hypertension secondary to endocrine disorders: Secondary | ICD-10-CM

## 2019-01-25 DIAGNOSIS — E785 Hyperlipidemia, unspecified: Secondary | ICD-10-CM

## 2019-01-25 DIAGNOSIS — E1159 Type 2 diabetes mellitus with other circulatory complications: Secondary | ICD-10-CM

## 2019-01-25 MED ORDER — TRIAMTERENE-HCTZ 37.5-25 MG PO CAPS: ORAL_CAPSULE | ORAL | 3 refills | Status: DC

## 2019-01-25 MED ORDER — PRAVASTATIN SODIUM 40 MG PO TABS: 40.0000 mg | ORAL_TABLET | Freq: Every day | ORAL | 0 refills | Status: DC

## 2019-01-25 MED ORDER — ACCU-CHEK FASTCLIX LANCETS MISC: 2 refills | Status: DC

## 2019-01-25 MED ORDER — FLUTICASONE PROPIONATE 50 MCG/ACT NA SUSP: 1.0000 | Freq: Two times a day (BID) | NASAL | 0 refills | Status: DC | PRN

## 2019-01-25 MED ORDER — METFORMIN HCL 500 MG PO TABS: 500.0000 mg | ORAL_TABLET | Freq: Two times a day (BID) | ORAL | 3 refills | Status: DC

## 2019-01-25 MED ORDER — TRAZODONE HCL 50 MG PO TABS: ORAL_TABLET | ORAL | 2 refills | Status: DC

## 2019-01-25 MED ORDER — ACCU-CHEK GUIDE VI STRP: ORAL_STRIP | 6 refills | Status: DC

## 2019-01-25 NOTE — Progress Notes (Signed)
Patient: Misty Cervantes MRN: [id number] DOB: 07/22/1945 PCP: Briscoe Deutscher, DO     I connected with [place] on 01/25/19 at 1:42pm by a video enabled telemedicine application and verified that I am speaking with the correct person using two identifiers.  Location patient: Home Location provider: [facility], Office Persons participating in this virtual visit: Kiristen Snelling and Dr. Rogers Blocker   I discussed the limitations of evaluation and management by telemedicine and the availability of in person appointments. The patient expressed understanding and agreed to proceed.   Interactive audio and video telecommunications were attempted between this provider and patient, however failed, due to patient having technical difficulties OR patient did not have access to video capability.  We continued and completed visit with audio only.    Subjective:  Chief Complaint  Patient presents with  . Medication Refill  . Hyperlipidemia  . Hypertension  . Diabetes    HPI: The patient is a 73 y.o. female who presents today for transfer of care. She is new to me and chart has been reviewed with patient.   Diabetes: Patient is here for follow up of type 2 diabetes. First diagnosed she thinks about 2 years ago.  Currently on the following medications none.  Last A1C was 7.9. Currently not exercising and not following diabetic diet. . Denies any hypoglycemic events. Denies any vision changes, nausea, vomiting, abdominal pain, ulcers/paraesthesia in feet, polyuria, polydipsia or polyphagia. Denies any chest pain, shortness of breath. A1c was elevated, but no medication has been started. Was on metformin 750mg , but this was stopped when her a1c got better and not restarted when her a1c got worse. She thinks she was also on glipizide in the past.   Hyperlipidemia: hx of diabetes/htn and ascvd risk >30%. She is on a statin. Most recent labs to goal. Never smoked. +FH of heart disease in her parents. She has  no CAD hx.   Hypertension: Here for follow up of hypertension.  Currently on dyazide.  Takes medication as prescribed and denies any side effects. Exercise includes none. Weight has been stable. Denies any chest pain, headaches, shortness of breath, vision changes, swelling in lower extremities.    Review of Systems  Constitutional: Negative for chills, fatigue and fever.  HENT: Negative for dental problem, ear pain, hearing loss and trouble swallowing.   Eyes: Negative for visual disturbance.  Respiratory: Negative for cough, chest tightness and shortness of breath.   Cardiovascular: Negative for chest pain, palpitations and leg swelling.  Gastrointestinal: Negative for abdominal pain, blood in stool, diarrhea, nausea and vomiting.  Endocrine: Negative for cold intolerance, heat intolerance, polydipsia, polyphagia and polyuria.  Genitourinary: Negative for dysuria and hematuria.  Musculoskeletal: Negative for arthralgias.  Skin: Negative for rash.  Neurological: Negative for headaches.  Psychiatric/Behavioral: Positive for sleep disturbance. Negative for dysphoric mood. The patient is not nervous/anxious.     Allergies Patient is allergic to probiotic [acidophilus].  Past Medical History Patient  has a past medical history of Anemia, Cataract, Diabetes mellitus without complication ([place]), Gastric ulcer (11/17/2011), History of total left hip replacement (2011), HTN (hypertension), total knee replacement (2017), Hyperlipemia, Hyperplastic polyp of intestine (11/17/2011), Hypertension, Measles, Menopause, Migraine, and Osteoarthritis.  Surgical History Patient  has a past surgical history that includes Total vaginal hysterectomy (2012); Cataract extraction (Bilateral); Retinal detachment surgery; Hip Arthroplasty (Left, 2011); Joint replacement; Total knee arthroplasty (Right, 11/03/2015); and Carpal tunnel release (Right).  Family History Pateint's family history includes Diabetes in her  mother, sister,  and sister; Heart attack in her father and mother; Kidney cancer in her father.  Social History Patient  reports that she has never smoked. She has never used smokeless tobacco. She reports that she does not drink alcohol or use drugs.    Objective: Vitals:   01/25/19 1307  Weight: 145 lb (65.8 kg)  Height: 4' 11.5" (1.511 m)    Body mass index is 28.8 kg/m.  Physical Exam     Assessment/plan: 1. Essential hypertension, benign She states to goal at home. Continue current medication. Checking up/uc with hx of diabetes. Other labs wnl from 10/2018. Has appt with me in office in 3 months. Encouraged continued exercise and weight loss.  - triamterene-hydrochlorothiazide (DYAZIDE) 37.5-25 MG capsule; Take 1 capsule po qd  Dispense: 90 capsule; Refill: 3  2. Primary insomnia Trazodone prn works well. Refill given.  - traZODone (DESYREL) 50 MG tablet; TAKE 0.5-1 TABLETS BY MOUTH AT BEDTIME AS NEEDED FOR SLEEP.  Dispense: 90 tablet; Refill: 2  4. Type 2 diabetes mellitus with hyperglycemia, without long-term current use of insulin ([facility]) Not to goal. Having her come in for a1c check and renal function. Starting low dose metformin. At her age would like her less than 7.5. metformin 500mg  bid. Keep working on lifestyle changes. Has f/u in office in 3 months.  - glucose blood (ACCU-CHEK GUIDE) test strip; TEST FOUR TIMES DAILY.  Dispense: 100 each; Refill: 6 - Hemoglobin A1c; Future - Microalbumin / creatinine urine ratio; Future - Basic metabolic panel; Future  5. Hyperlipidemia associated with type 2 diabetes mellitus ([place]) Lipid panel to goal. Continue current statin. Refill given.   Return for keep scheduled appt. in february .  Records requested if needed. Time spent with patient: 25 minutes, of which >50% was spent in obtaining information about her symptoms, reviweing her previous labs, evaluations, and treatments, counseling her about her conditions (please see  discussed topics above), and developing a plan to further investigate it; she had a number of questions which I addressed.    Orma Flaming, MD [place]  01/25/2019

## 2019-02-22 ENCOUNTER — Other Ambulatory Visit: Payer: Self-pay | Admitting: Family Medicine

## 2019-02-22 DIAGNOSIS — R42 Dizziness and giddiness: Secondary | ICD-10-CM

## 2019-02-23 MED ORDER — MECLIZINE HCL 25 MG PO TABS: 25.0000 mg | ORAL_TABLET | Freq: Three times a day (TID) | ORAL | 0 refills | Status: DC | PRN

## 2019-02-28 ENCOUNTER — Ambulatory Visit (INDEPENDENT_AMBULATORY_CARE_PROVIDER_SITE_OTHER): Payer: Self-pay | Admitting: *Deleted

## 2019-02-28 NOTE — Telephone Encounter (Signed)
Pt called to say that she fell last night going down some steps . She fell backwards and hit the back of her head on concrete. she did not break the skin. She takes ASA everyday. She also has a history of vertigo. She denies nausea, vomiting, problems with vision or unsteadiness.  She does have a headache and has not taking any medication for that yet.  She also stated she fell a few weeks ago and hit the front of her head but she did not report this to any medical facility Per protocol she should be seen within 4 hours. She voiced understanding. Notified LB at [facility] for an appointment. No appointment available. Pt advised to go to an UC for assessment. She voiced understanding. She is also is advised to go to the ED if she starts having nausea, vomiting, blurred vision, severe headache, unsteadiness on feet.  She voiced understanding. Routing to the practice for review. sReason for Disposition . [1] Age over 61 years AND [2] swelling or bruise  Answer Assessment - Initial Assessment Questions 1. MECHANISM: "How did the injury happen?" For falls, ask: "What height did you fall from?" and "What surface did you fall against?"      Fell coming down the steps and fell backwards 2. ONSET: "When did the injury happen?" (Minutes or hours ago)      Last night 3. NEUROLOGIC SYMPTOMS: "Was there any loss of consciousness?" "Are there any other neurological symptoms?"      no 4. MENTAL STATUS: "Does the person know who he is, who you are, and where he is?"      yes 5. LOCATION: "What part of the head was hit?"      Back of the head 6. SCALP APPEARANCE: "What does the scalp look like? Is it bleeding now?" If so, ask: "Is it difficult to stop?"      No bleeding 7. SIZE: For cuts, bruises, or swelling, ask: "How large is it?" (e.g., inches or centimeters)      no 8. PAIN: "Is there any pain?" If so, ask: "How bad is it?"  (e.g., Scale 1-10; or mild, moderate, severe)     soreness 9.  TETANUS: For any breaks in the skin, ask: "When was the last tetanus booster?"     Did not break the skin 10. OTHER SYMPTOMS: "Do you have any other symptoms?" (e.g., neck pain, vomiting)       Has been nauseated and usually so in the morning 11. PREGNANCY: "Is there any chance you are pregnant?" "When was your last menstrual period?"       n/a  Protocols used: HEAD INJURY-A-AH

## 2019-02-28 NOTE — Telephone Encounter (Signed)
See note

## 2019-03-01 ENCOUNTER — Encounter (HOSPITAL_COMMUNITY): Payer: Self-pay

## 2019-03-01 ENCOUNTER — Emergency Department (HOSPITAL_COMMUNITY): Payer: Medicare Other

## 2019-03-01 ENCOUNTER — Emergency Department (HOSPITAL_COMMUNITY)
Admission: EM | Admit: 2019-03-01 | Discharge: 2019-03-01 | Disposition: A | Discharge status: Home / Self Care | Payer: Medicare Other | Attending: Emergency Medicine | Admitting: Emergency Medicine

## 2019-03-01 ENCOUNTER — Other Ambulatory Visit: Payer: Self-pay

## 2019-03-01 DIAGNOSIS — Z7982 Long term (current) use of aspirin: Secondary | ICD-10-CM | POA: Diagnosis not present

## 2019-03-01 DIAGNOSIS — R42 Dizziness and giddiness: Secondary | ICD-10-CM | POA: Diagnosis not present

## 2019-03-01 DIAGNOSIS — Z79899 Other long term (current) drug therapy: Secondary | ICD-10-CM | POA: Diagnosis not present

## 2019-03-01 DIAGNOSIS — S0990XA Unspecified injury of head, initial encounter: Secondary | ICD-10-CM | POA: Diagnosis not present

## 2019-03-01 DIAGNOSIS — W19XXXA Unspecified fall, initial encounter: Secondary | ICD-10-CM

## 2019-03-01 DIAGNOSIS — I10 Essential (primary) hypertension: Secondary | ICD-10-CM | POA: Insufficient documentation

## 2019-03-01 DIAGNOSIS — W108XXA Fall (on) (from) other stairs and steps, initial encounter: Secondary | ICD-10-CM | POA: Insufficient documentation

## 2019-03-01 DIAGNOSIS — M542 Cervicalgia: Secondary | ICD-10-CM

## 2019-03-01 DIAGNOSIS — Z7984 Long term (current) use of oral hypoglycemic drugs: Secondary | ICD-10-CM | POA: Diagnosis not present

## 2019-03-01 DIAGNOSIS — M479 Spondylosis, unspecified: Secondary | ICD-10-CM

## 2019-03-01 DIAGNOSIS — E119 Type 2 diabetes mellitus without complications: Secondary | ICD-10-CM | POA: Diagnosis not present

## 2019-03-01 DIAGNOSIS — S199XXA Unspecified injury of neck, initial encounter: Secondary | ICD-10-CM | POA: Diagnosis not present

## 2019-03-01 LAB — CBG MONITORING, ED: Glucose-Capillary: 113 mg/dL — ABNORMAL HIGH (ref 70–99)

## 2019-03-01 NOTE — ED Provider Notes (Signed)
[facility] DEPT Provider Note   CSN: [id number] Arrival date & time: 03/01/19  1803     History No chief complaint on file.   Misty Cervantes is a 73 y.o. female   HPI  Patient is a 73 year old female presented today for fall that occurred the night before last night.  Patient states that she was walking into her apartment with bags on her arms when she started falling backwards.  States that she is not sure if she was dizzy or not but did not lose consciousness and suddenly realized that she was falling backwards.  Patient states that she had one episode similar to this in the past and states that she usually uses a walker to get around at home.  Patient denies any loss of consciousness headache, focal weakness, nausea, vomiting, chest pain shortness of breath.  Patient does state that she has some mild neck pain.  Patient also endorses bilateral knee pain states that she has had knee replacement surgery and hip replacement surgery in the past.  States she is not on blood thinners.  She does take aspirin daily 81 mg.  Of note patient states that she truly dislikes drinking water and drinks as little as possible.  States that she has been told she is dehydrated in the past by her doctors and encouraged to drink more water.     Past Medical History:  Diagnosis Date  . Anemia   . Cataract   . Diabetes mellitus without complication ([place])   . Gastric ulcer 11/17/2011  . History of total left hip replacement 2011  . HTN (hypertension)   . Hx of total knee replacement 2017   11-03-2015   . Hyperlipemia   . Hyperplastic polyp of intestine 11/17/2011  . Hypertension   . Measles   . Menopause   . Migraine   . Osteoarthritis     Patient Active Problem List   Diagnosis Date Noted  . Type 2 diabetes mellitus with hyperglycemia, without long-term current use of insulin ([place]) 06/18/2016  . OA (osteoarthritis) of knee 11/03/2015  . Hyperlipidemia associated  with type 2 diabetes mellitus ([place]) 07/23/2014  . Genu valgum 08/11/2012  . Hypertension associated with diabetes ([place]) 02/25/2010    Past Surgical History:  Procedure Laterality Date  . CARPAL TUNNEL RELEASE Right   . CATARACT EXTRACTION Bilateral   . HIP ARTHROPLASTY Left 2011  . JOINT REPLACEMENT    . RETINAL DETACHMENT SURGERY    . TOTAL KNEE ARTHROPLASTY Right 11/03/2015   Procedure: RIGHT TOTAL KNEE ARTHROPLASTY;  Surgeon: Gaynelle Arabian, MD;  Location: [facility];  Service: Orthopedics;  Laterality: Right;  . TOTAL VAGINAL HYSTERECTOMY  2012   TVH, anterior repair, TVT (no mesh used for the anterior repair)     OB History    Gravida  2   Para  2   Term  2   Preterm      AB      Living  1     SAB      TAB      Ectopic      Multiple      Live Births  2           Family History  Problem Relation Age of Onset  . Diabetes Mother        deceased  . Heart attack Mother   . Diabetes Sister        x2  . Kidney cancer Father   .  Heart attack Father   . Diabetes Sister     Social History   Tobacco Use  . Smoking status: Never Smoker  . Smokeless tobacco: Never Used  Substance Use Topics  . Alcohol use: No  . Drug use: No    Home Medications Prior to Admission medications   Medication Sig Start Date End Date Taking? Authorizing Provider  aspirin EC 81 MG tablet Take 81 mg by mouth daily.   Yes [provider]  fluticasone (FLONASE) 50 MCG/ACT nasal spray Place 1 spray into both nostrils 2 (two) times daily as needed for allergies or rhinitis. 01/25/19  Yes Orma Flaming, MD  meclizine (ANTIVERT) 25 MG tablet Take 1 tablet (25 mg total) by mouth 3 (three) times daily as needed for dizziness. 02/23/19  Yes Orma Flaming, MD  metFORMIN (GLUCOPHAGE) 500 MG tablet Take 1 tablet (500 mg total) by mouth 2 (two) times daily with a meal. 01/25/19  Yes Orma Flaming, MD  NONFORMULARY OR COMPOUNDED ITEM Estradiol cream 0.02%, use 1/2 a gram vaginally  2 x a week at hs. 3 month supply sent, 3 refills 04/18/18  Yes Salvadore Dom, MD  pravastatin (PRAVACHOL) 40 MG tablet Take 1 tablet (40 mg total) by mouth daily. 01/25/19  Yes Orma Flaming, MD  traZODone (DESYREL) 50 MG tablet TAKE 0.5-1 TABLETS BY MOUTH AT BEDTIME AS NEEDED FOR SLEEP. Patient taking differently: Take 25-50 mg by mouth at bedtime as needed for sleep.  01/25/19  Yes Orma Flaming, MD  triamterene-hydrochlorothiazide (DYAZIDE) 37.5-25 MG capsule Take 1 capsule po qd Patient taking differently: Take 1 capsule by mouth daily.  01/25/19  Yes Orma Flaming, MD  Accu-Chek FastClix Lancets MISC Check blood sugars 1-2 times per day. 01/25/19   Orma Flaming, MD  glucose blood (ACCU-CHEK GUIDE) test strip TEST FOUR TIMES DAILY. 01/25/19   Orma Flaming, MD  phentermine (ADIPEX-P) 37.5 MG tablet Take 37.5 mg by mouth 2 (two) times daily. 02/17/18   [provider]    Allergies    Probiotic [acidophilus]  Review of Systems   Review of Systems  Constitutional: Negative for chills and fever.  HENT: Negative for congestion.   Eyes: Negative for pain.  Respiratory: Negative for cough and shortness of breath.   Cardiovascular: Negative for chest pain and leg swelling.  Gastrointestinal: Negative for abdominal pain and vomiting.  Genitourinary: Negative for dysuria.  Musculoskeletal: Negative for myalgias.       Neck pain  Skin: Negative for rash.  Neurological: Positive for light-headedness. Negative for seizures, speech difficulty, weakness, numbness and headaches.    Physical Exam Updated Vital Signs BP (!) 145/100 (BP Location: Left Arm)   Pulse 90   Temp 98.4 F (36.9 C) (Oral)   Resp 18   LMP  (LMP Unknown)   SpO2 97%   Physical Exam Vitals and nursing note reviewed.  Constitutional:      General: She is not in acute distress. HENT:     Head: Normocephalic and atraumatic.     Comments: No swelling or hematoma patient's head    Nose: Nose normal.   Eyes:     General: No scleral icterus. Neck:     Comments: Full range of motion of neck.  No midline tenderness. Cardiovascular:     Rate and Rhythm: Normal rate and regular rhythm.     Pulses: Normal pulses.     Heart sounds: Normal heart sounds.  Pulmonary:     Effort: Pulmonary effort is normal. No respiratory  distress.     Breath sounds: No wheezing.  Abdominal:     Palpations: Abdomen is soft.     Tenderness: There is no abdominal tenderness.  Musculoskeletal:     Cervical back: Normal range of motion.     Right lower leg: No edema.     Left lower leg: No edema.     Comments: Strength 5/5 bilateral lower and upper extremity flexion extension--wrist, grip, elbow, knee, ankle.  Skin:    General: Skin is warm and dry.     Capillary Refill: Capillary refill takes less than 2 seconds.     Comments: Small pencil eraser sized bruise to the lower back.   Patient has remarkably bad skin turgor  Neurological:     Mental Status: She is alert. Mental status is at baseline.     Comments: Sensation intact all 4 extremities.  Gait normal.  Alert and oriented x3, no sensory deficits.  Psychiatric:        Mood and Affect: Mood normal.        Behavior: Behavior normal.     ED Results / Procedures / Treatments   Labs (all labs ordered are listed, but only abnormal results are displayed) Labs Reviewed  CBG MONITORING, ED    EKG None  Radiology CT Head Wo Contrast  Result Date: 03/01/2019 CLINICAL DATA:  Fall 2 days ago.  Hit back of head. EXAM: CT HEAD WITHOUT CONTRAST TECHNIQUE: Contiguous axial images were obtained from the base of the skull through the vertex without intravenous contrast. COMPARISON:  None. FINDINGS: Brain: Physiologic calcifications in the basal ganglia bilaterally. Mild age related volume loss. No acute intracranial abnormality. Specifically, no hemorrhage, hydrocephalus, mass lesion, acute infarction, or significant intracranial injury. Vascular: No  hyperdense vessel or unexpected calcification. Skull: No acute calvarial abnormality. Sinuses/Orbits: Visualized paranasal sinuses and mastoids clear. Orbital soft tissues unremarkable. Other: None IMPRESSION: No acute intracranial abnormality. Electronically Signed   By: Rolm Baptise M.D.   On: 03/01/2019 18:57   CT Cervical Spine Wo Contrast  Result Date: 03/01/2019 CLINICAL DATA:  Fall 2 days ago. EXAM: CT CERVICAL SPINE WITHOUT CONTRAST TECHNIQUE: Multidetector CT imaging of the cervical spine was performed without intravenous contrast. Multiplanar CT image reconstructions were also generated. COMPARISON:  None. FINDINGS: Alignment: No subluxation Skull base and vertebrae: No acute fracture. No primary bone lesion or focal pathologic process. Soft tissues and spinal canal: No prevertebral fluid or swelling. No visible canal hematoma. Disc levels:  Diffuse degenerative disc and facet disease. Upper chest: Negative acute. Other: Carotid artery calcifications. IMPRESSION: Cervical spondylosis.  No acute bony abnormality. Electronically Signed   By: Rolm Baptise M.D.   On: 03/01/2019 18:58    Procedures Procedures (including critical care time)  Medications Ordered in ED Medications - No data to display  ED Course  I have reviewed the triage vital signs and the nursing notes.  Pertinent labs & imaging results that were available during my care of the patient were reviewed by me and considered in my medical decision making (see chart for details).    MDM Rules/Calculators/A&P                      Patient is well-appearing 73 year old female on no blood thinners presenting today for fall that occurred 2 days ago when she fell backwards and hit the back of her head.  Denies any neuro deficits weakness slurred speech or headache.  Does state that she is severely dehydrated at  baseline does not like to drink water.  Physical exam patient appears dehydrated with poor skin turgor.  Blood pressure  is mildly elevated she is not hypotensive.  Pulse within normal limits.  Patient has normal gait and is alert and oriented with no neurological deficits.  CT scan of head and neck without abnormalities.  There is evidence of spondylosis without spondylolisthesis.  There are no neurological deficits on exam I doubt that she is having any radicular symptoms she does not describe any.  We will check CBG to assess for hypoglycemia as patient is diabetic and describes some lightheadedness.  Most likely patient's lightheadedness is due to dehydration.  Will recommend follow-up with primary care doctor and increasing fluid intake.  Recommend that she use her walker at home.  Recommend that she not use any medications that cause drowsiness.     This patient appears reasonably screened and I doubt any other medical condition requiring further workup, evaluation, or treatment in the ED at this time prior to discharge.   Patient's vitals are WNL apart from vital sign abnormalities discussed above, patient is in NAD, and able to ambulate in the ED at their baseline. Pain has been managed or a plan has been made for home management and has no complaints prior to discharge. Patient is comfortable with above plan and is stable for discharge at this time. All questions were answered prior to disposition. Results from the ER workup discussed with the patient face to face and all questions answered to the best of my ability. The patient is safe for discharge with strict return precautions. Patient appears safe for discharge with appropriate follow-up. Conveyed my impression with the patient and they voiced understanding and are agreeable to plan.   An After Visit Summary was printed and given to the patient.  Portions of this note were generated with [employer]. Dictation errors may occur despite best attempts at proofreading.   I discussed this case with my attending physician who cosigned this note  including patient's presenting symptoms, physical exam, and planned diagnostics and interventions. Attending physician stated agreement with plan or made changes to plan which were implemented.   Attending physician assessed patient at bedside.   Final Clinical Impression(s) / ED Diagnoses Final diagnoses:  Spondylosis  Neck pain  Fall, initial encounter    Rx / DC Orders ED Discharge Orders    None       Tedd Sias, [place] 03/01/19 2224    Drenda Freeze, MD 03/01/19 [phone]

## 2019-03-01 NOTE — Discharge Instructions (Addendum)
Follow up with Dr. Eliberto Ivory in the next two weeks for reassessment.

## 2019-03-01 NOTE — ED Triage Notes (Signed)
Pt reports falling x2 days ago off a few steps in front of her apartment. Pt states she fell backwards and hit her head. Pt endorses neck and lower back pain. Pt states she became a little dizzy and lost her balance.

## 2019-03-12 ENCOUNTER — Other Ambulatory Visit: Payer: Self-pay

## 2019-03-12 NOTE — Progress Notes (Deleted)
GYNECOLOGY  VISIT   HPI: 73 y.o.   Divorced White or Caucasian Not Hispanic or Latino  female   [phone] with No LMP recorded (lmp unknown). Patient has had a hysterectomy.   here for     GYNECOLOGIC HISTORY: No LMP recorded (lmp unknown). Patient has had a hysterectomy. Contraception:*** Menopausal hormone therapy: ***        OB History    Gravida  2   Para  2   Term  2   Preterm      AB      Living  1     SAB      TAB      Ectopic      Multiple      Live Births  2              Patient Active Problem List   Diagnosis Date Noted  . Type 2 diabetes mellitus with hyperglycemia, without long-term current use of insulin ([place]) 06/18/2016  . OA (osteoarthritis) of knee 11/03/2015  . Hyperlipidemia associated with type 2 diabetes mellitus ([place]) 07/23/2014  . Genu valgum 08/11/2012  . Hypertension associated with diabetes ([place]) 02/25/2010    Past Medical History:  Diagnosis Date  . Anemia   . Cataract   . Diabetes mellitus without complication ([place])   . Gastric ulcer 11/17/2011  . History of total left hip replacement 2011  . HTN (hypertension)   . Hx of total knee replacement 2017   11-03-2015   . Hyperlipemia   . Hyperplastic polyp of intestine 11/17/2011  . Hypertension   . Measles   . Menopause   . Migraine   . Osteoarthritis     Past Surgical History:  Procedure Laterality Date  . CARPAL TUNNEL RELEASE Right   . CATARACT EXTRACTION Bilateral   . HIP ARTHROPLASTY Left 2011  . JOINT REPLACEMENT    . RETINAL DETACHMENT SURGERY    . TOTAL KNEE ARTHROPLASTY Right 11/03/2015   Procedure: RIGHT TOTAL KNEE ARTHROPLASTY;  Surgeon: Gaynelle Arabian, MD;  Location: [facility];  Service: Orthopedics;  Laterality: Right;  . TOTAL VAGINAL HYSTERECTOMY  2012   TVH, anterior repair, TVT (no mesh used for the anterior repair)    Current Outpatient Medications  Medication Sig Dispense Refill  . Accu-Chek FastClix Lancets MISC Check blood sugars 1-2 times per day.  100 each 2  . aspirin EC 81 MG tablet Take 81 mg by mouth daily.    . fluticasone (FLONASE) 50 MCG/ACT nasal spray Place 1 spray into both nostrils 2 (two) times daily as needed for allergies or rhinitis. 48 mL 0  . glucose blood (ACCU-CHEK GUIDE) test strip TEST FOUR TIMES DAILY. 100 each 6  . meclizine (ANTIVERT) 25 MG tablet Take 1 tablet (25 mg total) by mouth 3 (three) times daily as needed for dizziness. 30 tablet 0  . metFORMIN (GLUCOPHAGE) 500 MG tablet Take 1 tablet (500 mg total) by mouth 2 (two) times daily with a meal. 180 tablet 3  . NONFORMULARY OR COMPOUNDED ITEM Estradiol cream 0.02%, use 1/2 a gram vaginally 2 x a week at hs. 3 month supply sent, 3 refills 3 each 3  . phentermine (ADIPEX-P) 37.5 MG tablet Take 37.5 mg by mouth 2 (two) times daily.    . pravastatin (PRAVACHOL) 40 MG tablet Take 1 tablet (40 mg total) by mouth daily. 90 tablet 0  . traZODone (DESYREL) 50 MG tablet TAKE 0.5-1 TABLETS BY MOUTH AT BEDTIME AS NEEDED FOR SLEEP. (  Patient taking differently: Take 25-50 mg by mouth at bedtime as needed for sleep. ) 90 tablet 2  . triamterene-hydrochlorothiazide (DYAZIDE) 37.5-25 MG capsule Take 1 capsule po qd (Patient taking differently: Take 1 capsule by mouth daily. ) 90 capsule 3   No current facility-administered medications for this visit.     ALLERGIES: Probiotic [acidophilus]  Family History  Problem Relation Age of Onset  . Diabetes Mother        deceased  . Heart attack Mother   . Diabetes Sister        x2  . Kidney cancer Father   . Heart attack Father   . Diabetes Sister     Social History   Socioeconomic History  . Marital status: Divorced    Spouse name: Not on file  . Number of children: Not on file  . Years of education: Not on file  . Highest education level: Not on file  Occupational History  . Occupation: retired    Fish farm manager: RETIRED  Tobacco Use  . Smoking status: Never Smoker  . Smokeless tobacco: Never Used  Substance and Sexual  Activity  . Alcohol use: No  . Drug use: No  . Sexual activity: Not Currently    Partners: Male    Birth control/protection: Post-menopausal, Surgical  Other Topics Concern  . Not on file  Social History Narrative   Divorced, 2 children; retired.    Social Determinants of Health   Financial Resource Strain:   . Difficulty of Paying Living Expenses: Not on file  Food Insecurity:   . Worried About Charity fundraiser in the Last Year: Not on file  . Ran Out of Food in the Last Year: Not on file  Transportation Needs:   . Lack of Transportation (Medical): Not on file  . Lack of Transportation (Non-Medical): Not on file  Physical Activity:   . Days of Exercise per Week: Not on file  . Minutes of Exercise per Session: Not on file  Stress:   . Feeling of Stress : Not on file  Social Connections:   . Frequency of Communication with Friends and Family: Not on file  . Frequency of Social Gatherings with Friends and Family: Not on file  . Attends Religious Services: Not on file  . Active Member of Clubs or Organizations: Not on file  . Attends Archivist Meetings: Not on file  . Marital Status: Not on file  Intimate Partner Violence:   . Fear of Current or Ex-Partner: Not on file  . Emotionally Abused: Not on file  . Physically Abused: Not on file  . Sexually Abused: Not on file    ROS  PHYSICAL EXAMINATION:    LMP  (LMP Unknown)     General appearance: alert, cooperative and appears stated age Neck: no adenopathy, supple, symmetrical, trachea midline and thyroid {CHL AMB PHY EX THYROID NORM DEFAULT:[phone]::"normal to inspection and palpation"} Breasts: {Exam; breast:13139::"normal appearance, no masses or tenderness"} Abdomen: soft, non-tender; non distended, no masses,  no organomegaly  Pelvic: External genitalia:  no lesions              Urethra:  normal appearing urethra with no masses, tenderness or lesions              Bartholins and Skenes: normal                  Vagina: normal appearing vagina with normal color and discharge, no lesions  Cervix: {CHL AMB PHY EX CERVIX NORM DEFAULT:[phone]::"no lesions"}              Bimanual Exam:  Uterus:  {CHL AMB PHY EX UTERUS NORM [phone]::"normal size, contour, position, consistency, mobility, non-tender"}              Adnexa: {CHL AMB PHY EX ADNEXA NO MASS [phone]::"no mass, fullness, tenderness"}              Rectovaginal: {yes no:[id number]}.  Confirms.              Anus:  normal sphincter tone, no lesions  Chaperone was present for exam.  ASSESSMENT     PLAN    An After Visit Summary was printed and given to the patient.  *** minutes face to face time of which over 50% was spent in counseling.

## 2019-03-13 ENCOUNTER — Telehealth: Payer: Self-pay | Admitting: Obstetrics and Gynecology

## 2019-03-13 NOTE — Telephone Encounter (Signed)
Patient rescheduled 3 month pessary check to 04/12/19. She has a low grade fever and runny nose.

## 2019-03-14 ENCOUNTER — Ambulatory Visit: Payer: Medicare Other | Admitting: Obstetrics and Gynecology

## 2019-03-23 ENCOUNTER — Other Ambulatory Visit: Payer: Self-pay | Admitting: Family Medicine

## 2019-03-23 DIAGNOSIS — R42 Dizziness and giddiness: Secondary | ICD-10-CM

## 2019-03-30 ENCOUNTER — Telehealth: Payer: Self-pay | Admitting: Family Medicine

## 2019-03-30 NOTE — Telephone Encounter (Signed)
I called the patient to schedule AWV with Loma Sousa ([place]), but there was no answer and no option to leave a message. If patient calls back, please schedule Medicare Wellness Visit at next available opening. Last AWV 10/21/17 [facility] ([place])

## 2019-04-05 DIAGNOSIS — Z03818 Encounter for observation for suspected exposure to other biological agents ruled out: Secondary | ICD-10-CM | POA: Diagnosis not present

## 2019-04-05 DIAGNOSIS — Z8616 Personal history of COVID-19: Secondary | ICD-10-CM

## 2019-04-05 DIAGNOSIS — U071 COVID-19: Secondary | ICD-10-CM | POA: Diagnosis not present

## 2019-04-05 HISTORY — DX: Personal history of COVID-19: Z86.16

## 2019-04-12 ENCOUNTER — Ambulatory Visit: Payer: Medicare Other | Admitting: Obstetrics and Gynecology

## 2019-04-16 ENCOUNTER — Ambulatory Visit: Payer: Medicare Other

## 2019-04-16 ENCOUNTER — Encounter: Payer: Medicare Other | Admitting: Family Medicine

## 2019-04-18 ENCOUNTER — Other Ambulatory Visit: Payer: Self-pay | Admitting: Family Medicine

## 2019-04-18 DIAGNOSIS — J069 Acute upper respiratory infection, unspecified: Secondary | ICD-10-CM

## 2019-04-24 ENCOUNTER — Other Ambulatory Visit: Payer: Self-pay | Admitting: Family Medicine

## 2019-04-24 DIAGNOSIS — R42 Dizziness and giddiness: Secondary | ICD-10-CM

## 2019-05-24 ENCOUNTER — Other Ambulatory Visit: Payer: Self-pay

## 2019-05-24 ENCOUNTER — Ambulatory Visit (INDEPENDENT_AMBULATORY_CARE_PROVIDER_SITE_OTHER): Payer: Medicare Other

## 2019-05-24 VITALS — BP 130/70 | HR 98 | Temp 97.9°F | Ht 60.0 in | Wt 137.6 lb

## 2019-05-24 DIAGNOSIS — Z0000 Encounter for general adult medical examination without abnormal findings: Secondary | ICD-10-CM

## 2019-05-24 NOTE — Progress Notes (Signed)
Subjective:   Misty Cervantes is a 74 y.o. female who presents for Medicare Annual (Subsequent) preventive examination.  Review of Systems:   Cardiac Risk Factors include: advanced age (>71men, >34 women);hypertension;diabetes mellitus;dyslipidemia    Objective:     Vitals: BP 130/70   Pulse 98   Temp 97.9 F (36.6 C) (Temporal)   Ht 5' (1.524 m)   Wt 137 lb 9.6 oz (62.4 kg)   LMP  (LMP Unknown)   SpO2 100%   BMI 26.87 kg/m   Body mass index is 26.87 kg/m.  Advanced Directives 05/24/2019 03/01/2019 10/21/2017 07/22/2016 06/28/2016 11/03/2015 10/27/2015  Does Patient Have a Medical Advance Directive? No No Yes No No No No  Does patient want to make changes to medical advance directive? Yes (MAU/Ambulatory/Procedural Areas - Information given) - - - - - Yes - information given  Copy of Healthcare Power of Attorney in Chart? - - - - - No - copy requested No - copy requested  Would patient like information on creating a medical advance directive? - No - Patient declined - No - Patient declined Yes (MAU/Ambulatory/Procedural Areas - Information given) No - patient declined information No - patient declined information    Tobacco Social History   Tobacco Use  Smoking Status Never Smoker  Smokeless Tobacco Never Used     Counseling given: Not Answered   Clinical Intake:  Pre-visit preparation completed: Yes  Pain : No/denies pain  Diabetes: Yes(patient reported cbg to be 137 today) CBG done?: No Did pt. bring in CBG monitor from home?: No  How often do you need to have someone help you when you read instructions, pamphlets, or other written materials from your doctor or pharmacy?: 1 - Never  Interpreter Needed?: No  Information entered by :: Denman George LPN  Past Medical History:  Diagnosis Date  . Anemia   . Cataract   . Diabetes mellitus without complication ([place])   . Gastric ulcer 11/17/2011  . History of total left hip replacement 2011  . HTN  (hypertension)   . Hx of total knee replacement 2017   11-03-2015   . Hyperlipemia   . Hyperplastic polyp of intestine 11/17/2011  . Hypertension   . Measles   . Menopause   . Migraine   . Osteoarthritis    Past Surgical History:  Procedure Laterality Date  . CARPAL TUNNEL RELEASE Right   . CATARACT EXTRACTION Bilateral   . HIP ARTHROPLASTY Left 2011  . JOINT REPLACEMENT    . RETINAL DETACHMENT SURGERY    . TOTAL KNEE ARTHROPLASTY Right 11/03/2015   Procedure: RIGHT TOTAL KNEE ARTHROPLASTY;  Surgeon: Gaynelle Arabian, MD;  Location: [facility];  Service: Orthopedics;  Laterality: Right;  . TOTAL VAGINAL HYSTERECTOMY  2012   TVH, anterior repair, TVT (no mesh used for the anterior repair)   Family History  Problem Relation Age of Onset  . Diabetes Mother        deceased  . Heart attack Mother   . Diabetes Sister        x2  . Kidney cancer Father   . Heart attack Father   . Diabetes Sister    Social History   Socioeconomic History  . Marital status: Divorced    Spouse name: Not on file  . Number of children: Not on file  . Years of education: Not on file  . Highest education level: Not on file  Occupational History  . Occupation: retired    Fish farm manager:  RETIRED  Tobacco Use  . Smoking status: Never Smoker  . Smokeless tobacco: Never Used  Substance and Sexual Activity  . Alcohol use: No  . Drug use: No  . Sexual activity: Not Currently    Partners: Male    Birth control/protection: Post-menopausal, Surgical  Other Topics Concern  . Not on file  Social History Narrative   Divorced, 2 children; retired.    Custody of grandchildren    Social Determinants of Health   Financial Resource Strain:   . Difficulty of Paying Living Expenses:   Food Insecurity:   . Worried About Charity fundraiser in the Last Year:   . Arboriculturist in the Last Year:   Transportation Needs:   . Film/video editor (Medical):   Marland Kitchen Lack of Transportation (Non-Medical):   Physical  Activity:   . Days of Exercise per Week:   . Minutes of Exercise per Session:   Stress:   . Feeling of Stress :   Social Connections:   . Frequency of Communication with Friends and Family:   . Frequency of Social Gatherings with Friends and Family:   . Attends Religious Services:   . Active Member of Clubs or Organizations:   . Attends Archivist Meetings:   Marland Kitchen Marital Status:     Outpatient Encounter Medications as of 05/24/2019  Medication Sig  . Accu-Chek FastClix Lancets MISC Check blood sugars 1-2 times per day.  Marland Kitchen aspirin EC 81 MG tablet Take 81 mg by mouth daily.  . fluticasone (FLONASE) 50 MCG/ACT nasal spray PLACE 1 SPRAY INTO BOTH NOSTRILS 2 (TWO) TIMES DAILY AS NEEDED FOR ALLERGIES OR RHINITIS.  Marland Kitchen glucose blood (ACCU-CHEK GUIDE) test strip TEST FOUR TIMES DAILY.  . meclizine (ANTIVERT) 25 MG tablet TAKE 1 TABLET BY MOUTH 3 TIMES A DAY AS NEEDED FOR DIZZINESS  . metFORMIN (GLUCOPHAGE) 500 MG tablet Take 1 tablet (500 mg total) by mouth 2 (two) times daily with a meal.  . NONFORMULARY OR COMPOUNDED ITEM Estradiol cream 0.02%, use 1/2 a gram vaginally 2 x a week at hs. 3 month supply sent, 3 refills  . phentermine (ADIPEX-P) 37.5 MG tablet Take 37.5 mg by mouth 2 (two) times daily.  . pravastatin (PRAVACHOL) 40 MG tablet Take 1 tablet (40 mg total) by mouth daily.  . traZODone (DESYREL) 50 MG tablet TAKE 0.5-1 TABLETS BY MOUTH AT BEDTIME AS NEEDED FOR SLEEP. (Patient taking differently: Take 25-50 mg by mouth at bedtime as needed for sleep. )  . triamterene-hydrochlorothiazide (DYAZIDE) 37.5-25 MG capsule Take 1 capsule po qd (Patient taking differently: Take 1 capsule by mouth daily. )   No facility-administered encounter medications on file as of 05/24/2019.    Activities of Daily Living In your present state of health, do you have any difficulty performing the following activities: 05/24/2019  Hearing? N  Vision? N  Difficulty concentrating or making decisions? N    Walking or climbing stairs? N  Dressing or bathing? N  Doing errands, shopping? N  Preparing Food and eating ? N  Using the Toilet? N  In the past six months, have you accidently leaked urine? N  Do you have problems with loss of bowel control? N  Managing your Medications? N  Managing your Finances? N  Housekeeping or managing your Housekeeping? N  Some recent data might be hidden    Patient Care Team: Gaynelle Arabian, MD as Consulting Physician (Orthopedic Surgery) Salvadore Dom, MD as Consulting Physician (Obstetrics  and Gynecology) Calvert Cantor, MD as Consulting Physician (Ophthalmology) Florinda Marker as Physician Assistant (Physician Assistant)    Assessment:   This is a routine wellness examination for [place].  Exercise Activities and Dietary recommendations Current Exercise Habits: Home exercise routine, Type of exercise: walking, Time (Minutes): 30, Frequency (Times/Week): 4, Weekly Exercise (Minutes/Week): 120, Intensity: Mild  Goals    . DIET - INCREASE WATER INTAKE     Try to drink water at meals; Keep a cooler in the car  Will exercise when she eats or bs is high  [facility] when [facility] are in [place]     . Increase exercise to 3 days per week for one hour    . patient     Try to learn how to unwind...  Can try to get back [facility] [employer] and meet back up with friends        Fall Risk Fall Risk  05/24/2019 04/11/2018 10/21/2017 07/22/2016 06/28/2016  Falls in the past year? 1 0 Yes No No  Comment - - fell on her knee she just had replaced / has been one year  - -  Number falls in past yr: 0 0 1 - -  Injury with Fall? 1 0 No - -  Risk for fall due to : History of fall(s) - - - -  Follow up Falls evaluation completed;Education provided;Falls prevention discussed - Education provided - -   Is the patient's home free of loose throw rugs in walkways, pet beds, electrical cords, etc?   yes      Grab bars in the bathroom? yes      Handrails on the  stairs?   yes      Adequate lighting?   yes  Timed Get Up and Go performed: completed and within normal timeframe; no gait abnormalities noted   Depression Screen PHQ 2/9 Scores 05/24/2019 04/11/2018 10/21/2017 07/22/2016  PHQ - 2 Score 0 1 0 0  PHQ- 9 Score - 3 - -     Cognitive Function MMSE - Mini Mental State Exam 10/21/2017  Not completed: (No Data)     6CIT Screen 05/24/2019  What Year? 0 points  What month? 0 points  What time? 0 points  Count back from 20 0 points  Months in reverse 0 points  Repeat phrase 0 points  Total Score 0    Immunization History  Administered Date(s) Administered  . Fluad Quad(high Dose 65+) 11/19/2018  . Influenza, High Dose Seasonal PF 04/11/2018  . Influenza,inj,Quad PF,6+ Mos 01/28/2015  . Pneumococcal Polysaccharide-23 01/01/2011, 04/11/2018  . Zoster Recombinat (Shingrix) 03/26/2018, 11/19/2018    Qualifies for Shingles Vaccine? Shingrix completed   Screening Tests Health Maintenance  Topic Date Due  . TETANUS/TDAP  Never done  . OPHTHALMOLOGY EXAM  08/06/2017  . FOOT EXAM  06/18/2018  . URINE MICROALBUMIN  10/22/2018  . PNA vac Low Risk Adult (2 of 2 - PCV13) 04/12/2019  . HEMOGLOBIN A1C  04/19/2019  . MAMMOGRAM  12/03/2020  . COLONOSCOPY  11/16/2021  . INFLUENZA VACCINE  Completed  . DEXA SCAN  Completed  . Hepatitis C Screening  Discontinued    Cancer Screenings: Lung: Low Dose CT Chest recommended if Age 13-80 years, 30 pack-year currently smoking OR have quit w/in 15years. Patient does not qualify. Breast:  Up to date on Mammogram? Yes   Up to date of Bone Density/Dexa? Yes Colorectal: colonoscopy 11/17/11    Plan:    I  have personally reviewed and addressed the Medicare Annual Wellness questionnaire and have noted the following in the patient's chart:  A. Medical and social history B. Use of alcohol, tobacco or illicit drugs  C. Current medications and supplements D. Functional ability and status E.  Nutritional  status F.  Physical activity G. Advance directives H. List of other physicians I.  Hospitalizations, surgeries, and ER visits in previous 12 months J.  [place] such as hearing and vision if needed, cognitive and depression L. Referrals, records requested, and appointments- patient will self refer for diabetic eye exam   In addition, I have reviewed and discussed with patient certain preventive protocols, quality metrics, and best practice recommendations. A written personalized care plan for preventive services as well as general preventive health recommendations were provided to patient.   Signed,  Denman George, LPN  Nurse Health Advisor   Nurse Notes: No additional

## 2019-05-24 NOTE — Patient Instructions (Addendum)
[name] , Thank you for taking time to come for your Medicare Wellness Visit. I appreciate your ongoing commitment to your health goals. Please review the following plan we discussed and let me know if I can assist you in the future.   Screening recommendations/referrals: Colorectal Screening: up to date; colonoscopy 11/17/11 Mammogram: up to date;last 12/04/18 Bone Density: up to date; last 08/26/16   Vision and Dental Exams: Recommended annual ophthalmology exams for early detection of glaucoma and other disorders of the eye Recommended annual dental exams for proper oral hygiene  Diabetic Exams: Diabetic Eye Exam: recommended yearly Diabetic Foot Exam: recommended yearly  Vaccinations: Influenza vaccine: completed 11/19/18 Pneumococcal vaccine: Prevnar recommended; Pneumovax 04/11/18 Tdap vaccine: recommended; Please call your insurance company to determine your out of pocket expense. You may receive this vaccine at your local pharmacy or Health Dept. Shingles vaccine: Shingrix completed   Advanced directives: Advance directives discussed with you today. I have provided a copy for you to complete at home and have notarized. Once this is complete please bring a copy in to our office so we can scan it into your chart.  Goals: Recommend to drink at least 6-8 8oz glasses of water per day and consume a balanced diet rich in fresh fruits and vegetables.   Next appointment: Please schedule your Annual Wellness Visit with your Nurse Health Advisor in one year.  Preventive Care 40-64 Years, Female Preventive care refers to lifestyle choices and visits with your health care provider that can promote health and wellness. What does preventive care include?  A yearly physical exam. This is also called an annual well check.  Dental exams once or twice a year.  Routine eye exams. Ask your health care provider how often you should have your eyes checked.  Personal lifestyle choices,  including:  Daily care of your teeth and gums.  Regular physical activity.  Eating a healthy diet.  Avoiding tobacco and drug use.  Limiting alcohol use.  Practicing safe sex.  Taking low-dose aspirin daily starting at age 81 if recommended by your health care provider.  Taking vitamin and mineral supplements as recommended by your health care provider. What happens during an annual well check? The services and screenings done by your health care provider during your annual well check will depend on your age, overall health, lifestyle risk factors, and family history of disease. Counseling  Your health care provider may ask you questions about your:  Alcohol use.  Tobacco use.  Drug use.  Emotional well-being.  Home and relationship well-being.  Sexual activity.  Eating habits.  Work and work Statistician.  Method of birth control.  Menstrual cycle.  Pregnancy history. Screening  You may have the following tests or measurements:  Height, weight, and BMI.  Blood pressure.  Lipid and cholesterol levels. These may be checked every 5 years, or more frequently if you are over 91 years old.  Skin check.  Lung cancer screening. You may have this screening every year starting at age 55 if you have a 30-pack-year history of smoking and currently smoke or have quit within the past 15 years.  Fecal occult blood test (FOBT) of the stool. You may have this test every year starting at age 52.  Flexible sigmoidoscopy or colonoscopy. You may have a sigmoidoscopy every 5 years or a colonoscopy every 10 years starting at age 33.  Hepatitis C blood test.  Hepatitis B blood test.  Sexually transmitted disease (STD) testing.  Diabetes screening. This  is done by checking your blood sugar (glucose) after you have not eaten for a while (fasting). You may have this done every 1-3 years.  Mammogram. This may be done every 1-2 years. Talk to your health care provider about  when you should start having regular mammograms. This may depend on whether you have a family history of breast cancer.  BRCA-related cancer screening. This may be done if you have a family history of breast, ovarian, tubal, or peritoneal cancers.  Pelvic exam and Pap test. This may be done every 3 years starting at age 25. Starting at age 66, this may be done every 5 years if you have a Pap test in combination with an HPV test.  Bone density scan. This is done to screen for osteoporosis. You may have this scan if you are at high risk for osteoporosis. Discuss your test results, treatment options, and if necessary, the need for more tests with your health care provider. Vaccines  Your health care provider may recommend certain vaccines, such as:  Influenza vaccine. This is recommended every year.  Tetanus, diphtheria, and acellular pertussis (Tdap, Td) vaccine. You may need a Td booster every 10 years.  Zoster vaccine. You may need this after age 63.  Pneumococcal 13-valent conjugate (PCV13) vaccine. You may need this if you have certain conditions and were not previously vaccinated.  Pneumococcal polysaccharide (PPSV23) vaccine. You may need one or two doses if you smoke cigarettes or if you have certain conditions. Talk to your health care provider about which screenings and vaccines you need and how often you need them. This information is not intended to replace advice given to you by your health care provider. Make sure you discuss any questions you have with your health care provider. Document Released: 03/28/2015 Document Revised: 11/19/2015 Document Reviewed: 12/31/2014 Elsevier Interactive Patient Education  2017 [employer] Prevention in the Home Falls can cause injuries. They can happen to people of all ages. There are many things you can do to make your home safe and to help prevent falls. What can I do on the outside of my home?  Regularly fix the edges of  walkways and driveways and fix any cracks.  Remove anything that might make you trip as you walk through a door, such as a raised step or threshold.  Trim any bushes or trees on the path to your home.  Use bright outdoor lighting.  Clear any walking paths of anything that might make someone trip, such as rocks or tools.  Regularly check to see if handrails are loose or broken. Make sure that both sides of any steps have handrails.  Any raised decks and porches should have guardrails on the edges.  Have any leaves, snow, or ice cleared regularly.  Use sand or salt on walking paths during winter.  Clean up any spills in your garage right away. This includes oil or grease spills. What can I do in the bathroom?  Use night lights.  Install grab bars by the toilet and in the tub and shower. Do not use towel bars as grab bars.  Use non-skid mats or decals in the tub or shower.  If you need to sit down in the shower, use a plastic, non-slip stool.  Keep the floor dry. Clean up any water that spills on the floor as soon as it happens.  Remove soap buildup in the tub or shower regularly.  Attach bath mats securely with double-sided non-slip  rug tape.  Do not have throw rugs and other things on the floor that can make you trip. What can I do in the bedroom?  Use night lights.  Make sure that you have a light by your bed that is easy to reach.  Do not use any sheets or blankets that are too big for your bed. They should not hang down onto the floor.  Have a firm chair that has side arms. You can use this for support while you get dressed.  Do not have throw rugs and other things on the floor that can make you trip. What can I do in the kitchen?  Clean up any spills right away.  Avoid walking on wet floors.  Keep items that you use a lot in easy-to-reach places.  If you need to reach something above you, use a strong step stool that has a grab bar.  Keep electrical cords  out of the way.  Do not use floor polish or wax that makes floors slippery. If you must use wax, use non-skid floor wax.  Do not have throw rugs and other things on the floor that can make you trip. What can I do with my stairs?  Do not leave any items on the stairs.  Make sure that there are handrails on both sides of the stairs and use them. Fix handrails that are broken or loose. Make sure that handrails are as long as the stairways.  Check any carpeting to make sure that it is firmly attached to the stairs. Fix any carpet that is loose or worn.  Avoid having throw rugs at the top or bottom of the stairs. If you do have throw rugs, attach them to the floor with carpet tape.  Make sure that you have a light switch at the top of the stairs and the bottom of the stairs. If you do not have them, ask someone to add them for you. What else can I do to help prevent falls?  Wear shoes that:  Do not have high heels.  Have rubber bottoms.  Are comfortable and fit you well.  Are closed at the toe. Do not wear sandals.  If you use a stepladder:  Make sure that it is fully opened. Do not climb a closed stepladder.  Make sure that both sides of the stepladder are locked into place.  Ask someone to hold it for you, if possible.  Clearly mark and make sure that you can see:  Any grab bars or handrails.  First and last steps.  Where the edge of each step is.  Use tools that help you move around (mobility aids) if they are needed. These include:  Canes.  Walkers.  Scooters.  Crutches.  Turn on the lights when you go into a dark area. Replace any light bulbs as soon as they burn out.  Set up your furniture so you have a clear path. Avoid moving your furniture around.  If any of your floors are uneven, fix them.  If there are any pets around you, be aware of where they are.  Review your medicines with your doctor. Some medicines can make you feel dizzy. This can increase  your chance of falling. Ask your doctor what other things that you can do to help prevent falls. This information is not intended to replace advice given to you by your health care provider. Make sure you discuss any questions you have with your health care provider. Document Released:  12/26/2008 Document Revised: 08/07/2015 Document Reviewed: 04/05/2014 Elsevier Interactive Patient Education  2017 [employer].

## 2019-05-25 ENCOUNTER — Other Ambulatory Visit: Payer: Self-pay | Admitting: Family Medicine

## 2019-05-25 DIAGNOSIS — R42 Dizziness and giddiness: Secondary | ICD-10-CM

## 2019-05-31 ENCOUNTER — Other Ambulatory Visit: Payer: Self-pay

## 2019-05-31 ENCOUNTER — Encounter: Payer: Self-pay | Admitting: Obstetrics and Gynecology

## 2019-05-31 ENCOUNTER — Ambulatory Visit (INDEPENDENT_AMBULATORY_CARE_PROVIDER_SITE_OTHER): Payer: Medicare Other | Admitting: Obstetrics and Gynecology

## 2019-05-31 VITALS — BP 138/64 | HR 77 | Temp 97.9°F | Wt 137.0 lb

## 2019-05-31 DIAGNOSIS — N952 Postmenopausal atrophic vaginitis: Secondary | ICD-10-CM | POA: Diagnosis not present

## 2019-05-31 DIAGNOSIS — Z4689 Encounter for fitting and adjustment of other specified devices: Secondary | ICD-10-CM | POA: Diagnosis not present

## 2019-05-31 NOTE — Progress Notes (Signed)
GYNECOLOGY  VISIT   HPI: 74 y.o.   Divorced White or Caucasian Not Hispanic or Latino  female   [phone] with No LMP recorded (lmp unknown). Patient has had a hysterectomy.   here for Pessary Maintenance  H/O TVH. She has a grade 3 cystocele and grade 1 vault prolapse. Uses her pessary intermittently, secondary to h/o irritation. Sometimes leaves it in for a couple of days. She is using estrogen 2 x a week.  No vaginal bleeding.   She moved in January, renting to own. Her whole family got Covid in January.   She has lost 10 lbs.   GYNECOLOGIC HISTORY: No LMP recorded (lmp unknown). Patient has had a hysterectomy. Contraception:none  Menopausal hormone therapy: estradiol         OB History    Gravida  2   Para  2   Term  2   Preterm      AB      Living  1     SAB      TAB      Ectopic      Multiple      Live Births  2              Patient Active Problem List   Diagnosis Date Noted  . Type 2 diabetes mellitus with hyperglycemia, without long-term current use of insulin ([place]) 06/18/2016  . OA (osteoarthritis) of knee 11/03/2015  . Hyperlipidemia associated with type 2 diabetes mellitus ([place]) 07/23/2014  . Genu valgum 08/11/2012  . Hypertension associated with diabetes ([place]) 02/25/2010    Past Medical History:  Diagnosis Date  . Anemia   . Cataract   . Diabetes mellitus without complication ([place])   . Gastric ulcer 11/17/2011  . History of total left hip replacement 2011  . HTN (hypertension)   . Hx of total knee replacement 2017   11-03-2015   . Hyperlipemia   . Hyperplastic polyp of intestine 11/17/2011  . Hypertension   . Measles   . Menopause   . Migraine   . Osteoarthritis     Past Surgical History:  Procedure Laterality Date  . CARPAL TUNNEL RELEASE Right   . CATARACT EXTRACTION Bilateral   . HIP ARTHROPLASTY Left 2011  . JOINT REPLACEMENT    . RETINAL DETACHMENT SURGERY    . TOTAL KNEE ARTHROPLASTY Right 11/03/2015   Procedure: RIGHT  TOTAL KNEE ARTHROPLASTY;  Surgeon: Gaynelle Arabian, MD;  Location: [facility];  Service: Orthopedics;  Laterality: Right;  . TOTAL VAGINAL HYSTERECTOMY  2012   TVH, anterior repair, TVT (no mesh used for the anterior repair)    Current Outpatient Medications  Medication Sig Dispense Refill  . Accu-Chek FastClix Lancets MISC Check blood sugars 1-2 times per day. 100 each 2  . aspirin EC 81 MG tablet Take 81 mg by mouth daily.    . fluticasone (FLONASE) 50 MCG/ACT nasal spray PLACE 1 SPRAY INTO BOTH NOSTRILS 2 (TWO) TIMES DAILY AS NEEDED FOR ALLERGIES OR RHINITIS. 48 mL 0  . glucose blood (ACCU-CHEK GUIDE) test strip TEST FOUR TIMES DAILY. 100 each 6  . meclizine (ANTIVERT) 25 MG tablet TAKE 1 TABLET BY MOUTH 3 TIMES A DAY AS NEEDED FOR DIZZINESS 30 tablet 0  . metFORMIN (GLUCOPHAGE) 500 MG tablet Take 1 tablet (500 mg total) by mouth 2 (two) times daily with a meal. 180 tablet 3  . NONFORMULARY OR COMPOUNDED ITEM Estradiol cream 0.02%, use 1/2 a gram vaginally 2 x a week at  hs. 3 month supply sent, 3 refills 3 each 3  . phentermine (ADIPEX-P) 37.5 MG tablet Take 37.5 mg by mouth 2 (two) times daily.    . pravastatin (PRAVACHOL) 40 MG tablet TAKE 1 TABLET BY MOUTH EVERY DAY 90 tablet 0  . traZODone (DESYREL) 50 MG tablet TAKE 0.5-1 TABLETS BY MOUTH AT BEDTIME AS NEEDED FOR SLEEP. (Patient taking differently: Take 25-50 mg by mouth at bedtime as needed for sleep. ) 90 tablet 2  . triamterene-hydrochlorothiazide (DYAZIDE) 37.5-25 MG capsule Take 1 capsule po qd (Patient taking differently: Take 1 capsule by mouth daily. ) 90 capsule 3   No current facility-administered medications for this visit.     ALLERGIES: Probiotic [acidophilus]  Family History  Problem Relation Age of Onset  . Diabetes Mother        deceased  . Heart attack Mother   . Diabetes Sister        x2  . Kidney cancer Father   . Heart attack Father   . Diabetes Sister     Social History   Socioeconomic History  . Marital  status: Divorced    Spouse name: Not on file  . Number of children: Not on file  . Years of education: Not on file  . Highest education level: Not on file  Occupational History  . Occupation: retired    Fish farm manager: RETIRED  Tobacco Use  . Smoking status: Never Smoker  . Smokeless tobacco: Never Used  Substance and Sexual Activity  . Alcohol use: No  . Drug use: No  . Sexual activity: Not Currently    Partners: Male    Birth control/protection: Post-menopausal, Surgical  Other Topics Concern  . Not on file  Social History Narrative   Divorced, 2 children; retired.    Custody of grandchildren    Social Determinants of Health   Financial Resource Strain:   . Difficulty of Paying Living Expenses:   Food Insecurity:   . Worried About Charity fundraiser in the Last Year:   . Arboriculturist in the Last Year:   Transportation Needs:   . Film/video editor (Medical):   Marland Kitchen Lack of Transportation (Non-Medical):   Physical Activity:   . Days of Exercise per Week:   . Minutes of Exercise per Session:   Stress:   . Feeling of Stress :   Social Connections:   . Frequency of Communication with Friends and Family:   . Frequency of Social Gatherings with Friends and Family:   . Attends Religious Services:   . Active Member of Clubs or Organizations:   . Attends Archivist Meetings:   Marland Kitchen Marital Status:   Intimate Partner Violence:   . Fear of Current or Ex-Partner:   . Emotionally Abused:   Marland Kitchen Physically Abused:   . Sexually Abused:     Review of Systems  Constitutional: Negative.   HENT: Negative.   Eyes: Negative.   Respiratory: Negative.   Cardiovascular: Negative.   Gastrointestinal: Negative.   Genitourinary: Negative.   Musculoskeletal: Negative.   Skin: Negative.   Neurological: Negative.   Endo/Heme/Allergies: Negative.   Psychiatric/Behavioral: Negative.     PHYSICAL EXAMINATION:    Wt 137 lb (62.1 kg)   LMP  (LMP Unknown)   BMI 26.76 kg/m      General appearance: alert, cooperative and appears stated age  Pelvic: External genitalia:  no lesions              Urethra:  normal appearing urethra with no masses, tenderness or lesions              Bartholins and Skenes: normal                 Vagina: mild vaginal atrophy, no vaginal irritation, grade 3 cystocele, grade 1 vault prolapse. Pessary replaced.               Cervix: absent              Chaperone, Gae Dry, was present for exam.  ASSESSMENT Genital prolapse, controlled with the pessary, but has had issues with irritation in the past Vaginal atrophy, controlled with vaginal atrophy    PLAN Continue with intermittent use of her pessary and 2 x a week vaginal estrogen We discussed the option of trying a cube pessary, she will let me know if she wants to do this.   An After Visit Summary was printed and given to the patient.

## 2019-06-19 DIAGNOSIS — L821 Other seborrheic keratosis: Secondary | ICD-10-CM | POA: Diagnosis not present

## 2019-06-19 DIAGNOSIS — L308 Other specified dermatitis: Secondary | ICD-10-CM | POA: Diagnosis not present

## 2019-06-19 DIAGNOSIS — D1801 Hemangioma of skin and subcutaneous tissue: Secondary | ICD-10-CM | POA: Diagnosis not present

## 2019-06-20 ENCOUNTER — Emergency Department (HOSPITAL_COMMUNITY)
Admission: EM | Admit: 2019-06-20 | Discharge: 2019-06-20 | Disposition: A | Discharge status: Home / Self Care | Payer: Medicare Other | Attending: Emergency Medicine | Admitting: Emergency Medicine

## 2019-06-20 ENCOUNTER — Emergency Department (HOSPITAL_COMMUNITY): Payer: Medicare Other

## 2019-06-20 ENCOUNTER — Other Ambulatory Visit: Payer: Self-pay

## 2019-06-20 ENCOUNTER — Encounter (HOSPITAL_COMMUNITY): Payer: Self-pay

## 2019-06-20 DIAGNOSIS — Y999 Unspecified external cause status: Secondary | ICD-10-CM | POA: Diagnosis not present

## 2019-06-20 DIAGNOSIS — S299XXA Unspecified injury of thorax, initial encounter: Secondary | ICD-10-CM | POA: Diagnosis not present

## 2019-06-20 DIAGNOSIS — Z96642 Presence of left artificial hip joint: Secondary | ICD-10-CM | POA: Diagnosis not present

## 2019-06-20 DIAGNOSIS — Z96651 Presence of right artificial knee joint: Secondary | ICD-10-CM | POA: Diagnosis not present

## 2019-06-20 DIAGNOSIS — W010XXA Fall on same level from slipping, tripping and stumbling without subsequent striking against object, initial encounter: Secondary | ICD-10-CM | POA: Diagnosis not present

## 2019-06-20 DIAGNOSIS — S3991XA Unspecified injury of abdomen, initial encounter: Secondary | ICD-10-CM | POA: Diagnosis not present

## 2019-06-20 DIAGNOSIS — S3992XA Unspecified injury of lower back, initial encounter: Secondary | ICD-10-CM | POA: Diagnosis not present

## 2019-06-20 DIAGNOSIS — I10 Essential (primary) hypertension: Secondary | ICD-10-CM | POA: Diagnosis not present

## 2019-06-20 DIAGNOSIS — M545 Low back pain: Secondary | ICD-10-CM | POA: Diagnosis not present

## 2019-06-20 DIAGNOSIS — W19XXXA Unspecified fall, initial encounter: Secondary | ICD-10-CM

## 2019-06-20 DIAGNOSIS — E119 Type 2 diabetes mellitus without complications: Secondary | ICD-10-CM | POA: Diagnosis not present

## 2019-06-20 DIAGNOSIS — R52 Pain, unspecified: Secondary | ICD-10-CM | POA: Diagnosis not present

## 2019-06-20 DIAGNOSIS — S7002XA Contusion of left hip, initial encounter: Secondary | ICD-10-CM | POA: Diagnosis not present

## 2019-06-20 DIAGNOSIS — S79912A Unspecified injury of left hip, initial encounter: Secondary | ICD-10-CM | POA: Diagnosis not present

## 2019-06-20 DIAGNOSIS — Y939 Activity, unspecified: Secondary | ICD-10-CM | POA: Diagnosis not present

## 2019-06-20 DIAGNOSIS — Y92000 Kitchen of unspecified non-institutional (private) residence as  the place of occurrence of the external cause: Secondary | ICD-10-CM | POA: Diagnosis not present

## 2019-06-20 DIAGNOSIS — M25559 Pain in unspecified hip: Secondary | ICD-10-CM

## 2019-06-20 DIAGNOSIS — S50312A Abrasion of left elbow, initial encounter: Secondary | ICD-10-CM | POA: Diagnosis not present

## 2019-06-20 DIAGNOSIS — T148XXA Other injury of unspecified body region, initial encounter: Secondary | ICD-10-CM

## 2019-06-20 DIAGNOSIS — Z743 Need for continuous supervision: Secondary | ICD-10-CM | POA: Diagnosis not present

## 2019-06-20 LAB — COMPREHENSIVE METABOLIC PANEL
ALT: 18 U/L (ref 0–44)
AST: 20 U/L (ref 15–41)
Albumin: 3.8 g/dL (ref 3.5–5.0)
Alkaline Phosphatase: 56 U/L (ref 38–126)
Anion gap: 9 (ref 5–15)
BUN: 19 mg/dL (ref 8–23)
CO2: 28 mmol/L (ref 22–32)
Calcium: 9 mg/dL (ref 8.9–10.3)
Chloride: 103 mmol/L (ref 98–111)
Creatinine, Ser: 1.1 mg/dL — ABNORMAL HIGH (ref 0.44–1.00)
GFR calc Af Amer: 57 mL/min — ABNORMAL LOW (ref >60)
GFR calc non Af Amer: 49 mL/min — ABNORMAL LOW (ref >60)
Glucose, Bld: 166 mg/dL — ABNORMAL HIGH (ref 70–99)
Potassium: 3.4 mmol/L — ABNORMAL LOW (ref 3.5–5.1)
Sodium: 140 mmol/L (ref 135–145)
Total Bilirubin: 1 mg/dL (ref 0.3–1.2)
Total Protein: 6.4 g/dL — ABNORMAL LOW (ref 6.5–8.1)

## 2019-06-20 LAB — CBC
HCT: 38.8 % (ref 36.0–46.0)
Hemoglobin: 12.2 g/dL (ref 12.0–15.0)
MCH: 31 pg (ref 26.0–34.0)
MCHC: 31.4 g/dL (ref 30.0–36.0)
MCV: 98.7 fL (ref 80.0–100.0)
Platelets: 182 10*3/uL (ref 150–400)
RBC: 3.93 MIL/uL (ref 3.87–5.11)
RDW: 12.1 % (ref 11.5–15.5)
WBC: 5 10*3/uL (ref 4.0–10.5)
nRBC: 0 % (ref 0.0–0.2)

## 2019-06-20 MED ORDER — OXYCODONE HCL 5 MG PO TABS
5.0000 mg | ORAL_TABLET | Freq: Once | ORAL | Status: AC
  Administered 2019-06-20: 5 mg via ORAL
  Filled 2019-06-20: qty 1

## 2019-06-20 MED ORDER — SODIUM CHLORIDE (PF) 0.9 % IJ SOLN
INTRAMUSCULAR | Status: AC
  Filled 2019-06-20: qty 50

## 2019-06-20 MED ORDER — FENTANYL CITRATE (PF) 100 MCG/2ML IJ SOLN
25.0000 ug | Freq: Once | INTRAMUSCULAR | Status: AC
  Administered 2019-06-20: 11:00:00 25 ug via INTRAVENOUS
  Filled 2019-06-20: qty 2

## 2019-06-20 MED ORDER — OXYCODONE HCL 5 MG PO TABS: 5.0000 mg | ORAL_TABLET | ORAL | 0 refills | Status: DC | PRN

## 2019-06-20 MED ORDER — IOHEXOL 300 MG/ML  SOLN
100.0000 mL | Freq: Once | INTRAMUSCULAR | Status: AC | PRN
  Administered 2019-06-20: 100 mL via INTRAVENOUS

## 2019-06-20 NOTE — Discharge Instructions (Addendum)
You were evaluated in the Emergency Department and after careful evaluation, we did not find any emergent condition requiring admission or further testing in the hospital.  Your exam/testing today was overall reassuring.  Your x-rays and CTs did not show any broken bones or emergencies.  We suspect that your pain is related to bruising.  As discussed, please take Tylenol or Motrin at home for discomfort.  For more significant pain you can use the oxycodone tablets provided.  If you are still having great difficulty with pain after 1 week, we recommend calling your orthopedic surgeon for evaluation.  Please return to the Emergency Department if you experience any worsening of your condition.  We encourage you to follow up with a primary care provider.  Thank you for allowing [place] to be a part of your care.

## 2019-06-20 NOTE — ED Triage Notes (Signed)
EMS reports from home, tripped over stool in kitchen this morning, landing on left hip, and scraped left elbow. Denies LOC , no blood thinners. Pt c/o left and leg pain. Pt states left hip replacement approx 10 years ago.  BP 140/72 HR 80 RR 16 Sp02 96 RA CBG 173 Temp 98.2

## 2019-06-20 NOTE — ED Provider Notes (Signed)
[facility] Emergency Department Provider Note MRN:  [id number]  Arrival date & time: 06/20/19     Chief Complaint   Fall and Hip Pain   History of Present Illness   Misty Cervantes is a 74 y.o. year-old female with a history of diabetes, hypertension presenting to the ED with chief complaint of fall, hip pain.  Patient tripped over a stool in the kitchen this morning, landing on her left hip.  Denies head trauma, no loss of consciousness, no anticoagulation, no neck pain, no chest pain or shortness of breath, scrape to the left elbow but no significant pain to the arms.  She is unable to ambulate since the fall, severe pain to the left hip, also some pain to the lower back.  Pain is moderate to severe, constant, worse with motion.  Review of Systems  A complete 10 system review of systems was obtained and all systems are negative except as noted in the HPI and PMH.   Patient's Health History    Past Medical History:  Diagnosis Date  . Anemia   . Cataract   . Diabetes mellitus without complication ([place])   . Gastric ulcer 11/17/2011  . History of total left hip replacement 2011  . HTN (hypertension)   . Hx of total knee replacement 2017   11-03-2015   . Hyperlipemia   . Hyperplastic polyp of intestine 11/17/2011  . Hypertension   . Measles   . Menopause   . Migraine   . Osteoarthritis     Past Surgical History:  Procedure Laterality Date  . CARPAL TUNNEL RELEASE Right   . CATARACT EXTRACTION Bilateral   . HIP ARTHROPLASTY Left 2011  . JOINT REPLACEMENT    . RETINAL DETACHMENT SURGERY    . TOTAL KNEE ARTHROPLASTY Right 11/03/2015   Procedure: RIGHT TOTAL KNEE ARTHROPLASTY;  Surgeon: Gaynelle Arabian, MD;  Location: [facility];  Service: Orthopedics;  Laterality: Right;  . TOTAL VAGINAL HYSTERECTOMY  2012   TVH, anterior repair, TVT (no mesh used for the anterior repair)    Family History  Problem Relation Age of Onset  . Diabetes Mother    deceased  . Heart attack Mother   . Diabetes Sister        x2  . Kidney cancer Father   . Heart attack Father   . Diabetes Sister     Social History   Socioeconomic History  . Marital status: Divorced    Spouse name: Not on file  . Number of children: Not on file  . Years of education: Not on file  . Highest education level: Not on file  Occupational History  . Occupation: retired    Fish farm manager: RETIRED  Tobacco Use  . Smoking status: Never Smoker  . Smokeless tobacco: Never Used  Substance and Sexual Activity  . Alcohol use: No  . Drug use: No  . Sexual activity: Not Currently    Partners: Male    Birth control/protection: Post-menopausal, Surgical  Other Topics Concern  . Not on file  Social History Narrative   Divorced, 2 children; retired.    Custody of grandchildren    Social Determinants of Health   Financial Resource Strain:   . Difficulty of Paying Living Expenses:   Food Insecurity:   . Worried About Charity fundraiser in the Last Year:   . Arboriculturist in the Last Year:   Transportation Needs:   . Film/video editor (Medical):   Marland Kitchen  Lack of Transportation (Non-Medical):   Physical Activity:   . Days of Exercise per Week:   . Minutes of Exercise per Session:   Stress:   . Feeling of Stress :   Social Connections:   . Frequency of Communication with Friends and Family:   . Frequency of Social Gatherings with Friends and Family:   . Attends Religious Services:   . Active Member of Clubs or Organizations:   . Attends Archivist Meetings:   Marland Kitchen Marital Status:   Intimate Partner Violence:   . Fear of Current or Ex-Partner:   . Emotionally Abused:   Marland Kitchen Physically Abused:   . Sexually Abused:      Physical Exam   Vitals:   06/20/19 1215 06/20/19 1230  BP:  129/70  Pulse: 81 83  Resp:  16  Temp:    SpO2: 92% 92%    CONSTITUTIONAL: Well-appearing, NAD NEURO:  Alert and oriented x 3, no focal deficits EYES:  eyes equal and  reactive ENT/NECK:  no LAD, no JVD CARDIO: Regular rate, well-perfused, normal S1 and S2 PULM:  CTAB no wheezing or rhonchi GI/GU:  normal bowel sounds, non-distended, non-tender MSK/SPINE:  No gross deformities, no edema, lower extremities are neurovascularly intact, limited range of motion left hip due to pain. SKIN:  no rash, atraumatic PSYCH:  Appropriate speech and behavior  *Additional and/or pertinent findings included in MDM below  Diagnostic and Interventional Summary    EKG Interpretation  Date/Time:    Ventricular Rate:    PR Interval:    QRS Duration:   QT Interval:    QTC Calculation:   R Axis:     Text Interpretation:        Labs Reviewed  COMPREHENSIVE METABOLIC PANEL - Abnormal; Notable for the following components:      Result Value   Potassium 3.4 (*)    Glucose, Bld 166 (*)    Creatinine, Ser 1.10 (*)    Total Protein 6.4 (*)    GFR calc non Af Amer 49 (*)    GFR calc Af Amer 57 (*)    All other components within normal limits  CBC    CT ABDOMEN PELVIS W CONTRAST  Final Result    CT L-SPINE NO CHARGE  Final Result    CT CHEST W CONTRAST  Final Result    DG Hip Unilat W or Wo Pelvis 2-3 Views Left  Final Result    DG Lumbar Spine Complete  Final Result      Medications  sodium chloride (PF) 0.9 % injection (has no administration in time range)  oxyCODONE (Oxy IR/ROXICODONE) immediate release tablet 5 mg (has no administration in time range)  fentaNYL (SUBLIMAZE) injection 25 mcg (25 mcg Intravenous Given 06/20/19 1035)  iohexol (OMNIPAQUE) 300 MG/ML solution 100 mL (100 mLs Intravenous Contrast Given 06/20/19 1312)     Procedures  /  Critical Care Procedures  ED Course and Medical Decision Making  I have reviewed the triage vital signs, the nursing notes, and pertinent available records from the EMR.  Listed above are laboratory and imaging tests that I personally ordered, reviewed, and interpreted and then considered in my medical  decision making (see below for details).      Question periprosthetic fracture or dislocation, x-ray pending.  X-rays are without signs of fracture.  Patient is still in considerable pain, raising concern for possible occult fracture.  CT imaging obtained, again revealing no signs of fracture, likely bruising soft tissue  swelling.  Patient is still with a fair amount of pain and having difficulty with ambulation.  Discussed options with patient, who is comfortable going home at this time, has a number of family members that can support her for the next few days.  We offered case management or social worker consultation to see if she needed further home health services, which she declined.  Advised follow-up with orthopedic specialist if still having significant pain after 1 week.  Barth Kirks. Sedonia Small, [place] [email or web address]  Final Clinical Impressions(s) / ED Diagnoses     ICD-10-CM   1. Hip pain  M25.559   2. Fall  W19.XXXA CT L-SPINE NO CHARGE    CT L-SPINE NO CHARGE  3. Bruising  [email or web address]     ED Discharge Orders         Ordered    oxyCODONE (ROXICODONE) 5 MG immediate release tablet  Every 4 hours PRN     06/20/19 1434           Discharge Instructions Discussed with and Provided to Patient:     Discharge Instructions     You were evaluated in the Emergency Department and after careful evaluation, we did not find any emergent condition requiring admission or further testing in the hospital.  Your exam/testing today was overall reassuring.  Your x-rays and CTs did not show any broken bones or emergencies.  We suspect that your pain is related to bruising.  As discussed, please take Tylenol or Motrin at home for discomfort.  For more significant pain you can use the oxycodone tablets provided.  If you are still having great difficulty with pain after 1 week, we recommend calling your orthopedic surgeon for  evaluation.  Please return to the Emergency Department if you experience any worsening of your condition.  We encourage you to follow up with a primary care provider.  Thank you for allowing [place] to be a part of your care.        Maudie Flakes, MD 06/20/19 [phone]

## 2019-06-22 ENCOUNTER — Telehealth (INDEPENDENT_AMBULATORY_CARE_PROVIDER_SITE_OTHER): Payer: Medicare Other | Admitting: Family Medicine

## 2019-06-22 ENCOUNTER — Encounter: Payer: Self-pay | Admitting: Family Medicine

## 2019-06-22 VITALS — Temp 97.6°F | Ht 60.0 in | Wt 137.0 lb

## 2019-06-22 DIAGNOSIS — I700 Atherosclerosis of aorta: Secondary | ICD-10-CM | POA: Diagnosis not present

## 2019-06-22 DIAGNOSIS — M25552 Pain in left hip: Secondary | ICD-10-CM | POA: Diagnosis not present

## 2019-06-22 MED ORDER — OXYCODONE HCL 5 MG PO TABS: 2.5000 mg | ORAL_TABLET | Freq: Four times a day (QID) | ORAL | 0 refills | Status: DC | PRN

## 2019-06-22 NOTE — Progress Notes (Signed)
Phone [phone] Virtual visit via Video note   Subjective:  Chief complaint: Chief Complaint  Patient presents with  . Follow-up  . hip pain   This visit type was conducted due to national recommendations for restrictions regarding the COVID-19 Pandemic (e.g. social distancing).  This format is felt to be most appropriate for this patient at this time balancing risks to patient and risks to population by having him in for in person visit.  No physical exam was performed (except for noted visual exam or audio findings with Telehealth visits).    Our team/I connected with Barron Alvine at  2:20 PM EDT by a video enabled telemedicine application (doxy.me or caregility through epic) and verified that I am speaking with the correct person using two identifiers.  Location patient: [place] Location provider: [facility], office Persons participating in the virtual visit:  Patient, granddaughter  Our team/I discussed the limitations of evaluation and management by telemedicine and the availability of in person appointments. In light of current covid-19 pandemic, patient also understands that we are trying to protect them by minimizing in office contact if at all possible.  The patient expressed consent for telemedicine visit and agreed to proceed. Patient understands insurance will be billed.   Past Medical History-  Patient Active Problem List   Diagnosis Date Noted  . Aortic atherosclerosis ([place]) 06/22/2019  . Type 2 diabetes mellitus with hyperglycemia, without long-term current use of insulin ([place]) 06/18/2016  . OA (osteoarthritis) of knee 11/03/2015  . Hyperlipidemia associated with type 2 diabetes mellitus ([place]) 07/23/2014  . Genu valgum 08/11/2012  . Hypertension associated with diabetes ([place]) 02/25/2010    Medications- reviewed and updated Current Outpatient Medications  Medication Sig Dispense Refill  . Accu-Chek FastClix Lancets MISC Check blood sugars 1-2 times per day.  100 each 2  . aspirin EC 81 MG tablet Take 81 mg by mouth daily.    . Cyanocobalamin (B-12 PO) Take 1 tablet by mouth daily.    . fluticasone (FLONASE) 50 MCG/ACT nasal spray PLACE 1 SPRAY INTO BOTH NOSTRILS 2 (TWO) TIMES DAILY AS NEEDED FOR ALLERGIES OR RHINITIS. 48 mL 0  . glucose blood (ACCU-CHEK GUIDE) test strip TEST FOUR TIMES DAILY. 100 each 6  . ibuprofen (ADVIL) 200 MG tablet Take 400 mg by mouth every 6 (six) hours as needed for headache or mild pain.    . meclizine (ANTIVERT) 25 MG tablet TAKE 1 TABLET BY MOUTH 3 TIMES A DAY AS NEEDED FOR DIZZINESS (Patient taking differently: Take 25 mg by mouth 3 (three) times daily as needed for dizziness. ) 30 tablet 0  . metFORMIN (GLUCOPHAGE) 500 MG tablet Take 1 tablet (500 mg total) by mouth 2 (two) times daily with a meal. 180 tablet 3  . Multiple Vitamin (MULTIVITAMIN WITH MINERALS) TABS tablet Take 1 tablet by mouth daily.    . NONFORMULARY OR COMPOUNDED ITEM Estradiol cream 0.02%, use 1/2 a gram vaginally 2 x a week at hs. 3 month supply sent, 3 refills (Patient taking differently: Place 0.5 g vaginally See admin instructions. Estradiol cream 0.02%, use 1/2 a gram vaginally 2 x a week at hs. 3 month supply sent, 3 refills) 3 each 3  . oxyCODONE (ROXICODONE) 5 MG immediate release tablet Take 0.5-1 tablets (2.5-5 mg total) by mouth every 6 (six) hours as needed for severe pain. 10 tablet 0  . pravastatin (PRAVACHOL) 40 MG tablet TAKE 1 TABLET BY MOUTH EVERY DAY (Patient taking differently: Take 40 mg by  mouth daily. ) 90 tablet 0  . traZODone (DESYREL) 50 MG tablet TAKE 0.5-1 TABLETS BY MOUTH AT BEDTIME AS NEEDED FOR SLEEP. (Patient taking differently: Take 25-50 mg by mouth at bedtime as needed for sleep. ) 90 tablet 2  . triamcinolone cream (KENALOG) 0.1 % Apply 1 application topically 4 (four) times daily as needed (bug bites).     . triamterene-hydrochlorothiazide (DYAZIDE) 37.5-25 MG capsule Take 1 capsule po qd (Patient taking differently:  Take 1 capsule by mouth daily. ) 90 capsule 3   No current facility-administered medications for this visit.     Objective:  Temp 97.6 F (36.4 C)   Ht 5' (1.524 m)   Wt 137 lb (62.1 kg)   LMP  (LMP Unknown)   BMI 26.76 kg/m  self reported vitals Gen: NAD, resting comfortably Lungs: nonlabored, normal respiratory rate  Skin: appears dry, no obvious rash Scrape on left elbow noted     Assessment and Plan   Left Hip Pain S: pt granddaughter states she fell on hip and rates her pain today a 7.5/10. Pt is taking Oxy (was only Rx'd 6 needs a refill) and Tylenol. She feels better when she takes oxycodone and rotates with tylenol and that helps her at least sleep- She has one oxycodone left.   Had a footstool in kitchen and she tripped over it and fell onto the left hip where she has a replacement. Dr. Maureen Ralphs was original surgeon. She didn't go in the night that she had the fall but went next morning by ambulance. X-ray of left hip with stable left total hip arthroplasty. She has some swelling over the hip still as well as bruising. They have tried to ice it but has not gone down. She is really sore in her hip but it is getting a little better. She is really sore in her hip but it is getting a little better.   She also had lumbar films with results  "1. No evidence of acute fracture. 2. Grade 1 anterolisthesis of the lower lumbar spine is favored degenerative. 3. Transitional anatomy with 6 lumbar type vertebral segments. 4. Aortic atherosclerosis."  CT chest abd/pelvis largely reassuring other than bruising/soft tissue injury over hip.   Ct lumbar spine- lumbarized vertebra and some arthritic changes.   A/P: 74 year old female with ongoing left hip pain after fall several days ago-had emergency room visit and had extensive work-up due to the level of her pain.  She was discharged with #6 oxycodone and she still thankfully has 1 of those left but she was worried about going into the  weekend without anything to help with pain.  Family is helping patient schedule Tylenol and then just using oxycodone when pain becomes extremely difficult to bear.   -Extensive imaging reviewed above -Patient is not very mobile-we discussed trying to move some at least every few hours -Recommended icing the left hip 20 minutes 3-4 times a day and switching over to heat after 3 days -I recommended she go ahead and call Dr. Maureen Ralphs to schedule follow-up for next week in case she fails to improve-I would like his opinion despite x-rays being reassuring -Patient does have a scrape on her left elbow and I recommended getting an updated Td vaccination-encouraged her to do this at her pharmacy as it would likely be difficult for her to get to the office before we closed today -I did refill oxycodone No. 10 but I asked her to try to use just 1/2 tablet if  possible -reviewed state database/PDMP and no concerning fill patterns-had been on phentermine with Dr. Juleen China but otherwise has not been on any controlled substances other than oxycodone recently prescribed for the hip pain\  #Aortic atherosclerosis S: We reviewed this finding from imaging A/P: We discussed the importance of risk factor modification-thankful patient will be establishing with Dr. Rogers Blocker soon to make sure she is modifying her risk factors  Recommended follow up: Has upcoming establish care visit Future Appointments  Date Time Provider [place]  07/12/2019  1:20 PM Orma Flaming, MD [facility]  09/05/2019 11:00 AM Salvadore Dom, MD [place] None    Lab/Order associations:   ICD-10-CM   1. Left hip pain  M25.552   2. Aortic atherosclerosis (HCC)  I70.0     Meds ordered this encounter  Medications  . oxyCODONE (ROXICODONE) 5 MG immediate release tablet    Sig: Take 0.5-1 tablets (2.5-5 mg total) by mouth every 6 (six) hours as needed for severe pain.    Dispense:  10 tablet    Refill:  0   Return precautions  advised.  Garret Reddish, MD

## 2019-06-22 NOTE — Patient Instructions (Addendum)
Health Maintenance Due  Topic Date Due  . TETANUS/TDAP-will address at next OV  Never done  . OPHTHALMOLOGY EXAM -will address at next OV 08/06/2017  . FOOT EXAM -will address at next OV 06/18/2018  . URINE MICROALBUMIN -will address at next [place] 10/22/2018  . PNA vac Low Risk Adult (2 of 2 - PCV13)-will address at next OV 04/12/2019  . HEMOGLOBIN A1C -will address at next OV 04/19/2019   Depression screen [facility] 2/9 05/24/2019 04/11/2018 10/21/2017  Decreased Interest 0 1 0  Down, Depressed, Hopeless 0 0 0  PHQ - 2 Score 0 1 0  Altered sleeping - 0 -  Tired, decreased energy - 1 -  Change in appetite - 0 -  Feeling bad or failure about yourself  - 1 -  Trouble concentrating - 0 -  Moving slowly or fidgety/restless - 0 -  Suicidal thoughts - 0 -  PHQ-9 Score - 3 -  Difficult doing work/chores - Not difficult at all -

## 2019-06-26 DIAGNOSIS — M25552 Pain in left hip: Secondary | ICD-10-CM | POA: Diagnosis not present

## 2019-06-26 DIAGNOSIS — M25562 Pain in left knee: Secondary | ICD-10-CM | POA: Diagnosis not present

## 2019-07-10 ENCOUNTER — Other Ambulatory Visit: Payer: Self-pay

## 2019-07-10 MED ORDER — NONFORMULARY OR COMPOUNDED ITEM: 1 refills | Status: DC

## 2019-07-10 NOTE — Telephone Encounter (Signed)
Medication refill request: estradiol 0.02% cream Last AEX:  04-18-2018 Last OV 05-31-2019:  Next OV: 09-05-2019 Next Aex: not scheduled Last MMG (if hormonal medication request): 12-04-2018 category a density birads 1:neg Refill authorized: please approve if appropriate

## 2019-07-12 ENCOUNTER — Other Ambulatory Visit: Payer: Self-pay

## 2019-07-12 ENCOUNTER — Ambulatory Visit (INDEPENDENT_AMBULATORY_CARE_PROVIDER_SITE_OTHER): Payer: Medicare Other | Admitting: Family Medicine

## 2019-07-12 ENCOUNTER — Encounter: Payer: Self-pay | Admitting: Family Medicine

## 2019-07-12 VITALS — BP 130/70 | HR 79 | Temp 97.6°F | Ht 60.0 in | Wt 141.4 lb

## 2019-07-12 DIAGNOSIS — I10 Essential (primary) hypertension: Secondary | ICD-10-CM

## 2019-07-12 DIAGNOSIS — R42 Dizziness and giddiness: Secondary | ICD-10-CM

## 2019-07-12 DIAGNOSIS — E1159 Type 2 diabetes mellitus with other circulatory complications: Secondary | ICD-10-CM

## 2019-07-12 DIAGNOSIS — E1165 Type 2 diabetes mellitus with hyperglycemia: Secondary | ICD-10-CM

## 2019-07-12 DIAGNOSIS — E785 Hyperlipidemia, unspecified: Secondary | ICD-10-CM

## 2019-07-12 DIAGNOSIS — E1169 Type 2 diabetes mellitus with other specified complication: Secondary | ICD-10-CM

## 2019-07-12 DIAGNOSIS — I152 Hypertension secondary to endocrine disorders: Secondary | ICD-10-CM

## 2019-07-12 LAB — CBC WITH DIFFERENTIAL/PLATELET
Basophils Absolute: 0 10*3/uL (ref 0.0–0.1)
Basophils Relative: 0.7 % (ref 0.0–3.0)
Eosinophils Absolute: 0.1 10*3/uL (ref 0.0–0.7)
Eosinophils Relative: 3.1 % (ref 0.0–5.0)
HCT: 39.2 % (ref 36.0–46.0)
Hemoglobin: 13.2 g/dL (ref 12.0–15.0)
Lymphocytes Relative: 25.6 % (ref 12.0–46.0)
Lymphs Abs: 1 10*3/uL (ref 0.7–4.0)
MCHC: 33.8 g/dL (ref 30.0–36.0)
MCV: 94.5 fl (ref 78.0–100.0)
Monocytes Absolute: 0.3 10*3/uL (ref 0.1–1.0)
Monocytes Relative: 7.8 % (ref 3.0–12.0)
Neutro Abs: 2.5 10*3/uL (ref 1.4–7.7)
Neutrophils Relative %: 62.8 % (ref 43.0–77.0)
Platelets: 284 10*3/uL (ref 150.0–400.0)
RBC: 4.14 Mil/uL (ref 3.87–5.11)
RDW: 13.2 % (ref 11.5–15.5)
WBC: 4 10*3/uL (ref 4.0–10.5)

## 2019-07-12 LAB — COMPREHENSIVE METABOLIC PANEL
ALT: 14 U/L (ref 0–35)
AST: 16 U/L (ref 0–37)
Albumin: 4.3 g/dL (ref 3.5–5.2)
Alkaline Phosphatase: 126 U/L — ABNORMAL HIGH (ref 39–117)
BUN: 23 mg/dL (ref 6–23)
CO2: 33 mEq/L — ABNORMAL HIGH (ref 19–32)
Calcium: 9.2 mg/dL (ref 8.4–10.5)
Chloride: 99 mEq/L (ref 96–112)
Creatinine, Ser: 1.02 mg/dL (ref 0.40–1.20)
GFR: 52.9 mL/min — ABNORMAL LOW (ref >60.00)
Glucose, Bld: 175 mg/dL — ABNORMAL HIGH (ref 70–99)
Potassium: 4 mEq/L (ref 3.5–5.1)
Sodium: 137 mEq/L (ref 135–145)
Total Bilirubin: 0.7 mg/dL (ref 0.2–1.2)
Total Protein: 6.7 g/dL (ref 6.0–8.3)

## 2019-07-12 LAB — LIPID PANEL
Cholesterol: 217 mg/dL — ABNORMAL HIGH (ref 0–200)
HDL: 52.1 mg/dL (ref >39.00)
LDL Cholesterol: 145 mg/dL — ABNORMAL HIGH (ref 0–99)
NonHDL: 164.95
Total CHOL/HDL Ratio: 4
Triglycerides: 99 mg/dL (ref 0.0–149.0)
VLDL: 19.8 mg/dL (ref 0.0–40.0)

## 2019-07-12 LAB — HEMOGLOBIN A1C: Hgb A1c MFr Bld: 7.5 % — ABNORMAL HIGH (ref 4.6–6.5)

## 2019-07-12 MED ORDER — MECLIZINE HCL 25 MG PO TABS: 25.0000 mg | ORAL_TABLET | Freq: Three times a day (TID) | ORAL | 1 refills | Status: DC | PRN

## 2019-07-12 NOTE — Patient Instructions (Addendum)
1) checking all of your labs today and we will see where you will be at with your diabetes.   2) blood pressure is excellent.   3) I want you to come back in 3 months for diabetes.   4) trial of voltaren get for your hip. Can put on this area four times a day   Good to see you in person. Glad your hip is slowly getting better!  Dr. Rogers Blocker

## 2019-07-12 NOTE — Progress Notes (Signed)
Patient: Misty Cervantes MRN: [id number] DOB: 1945/12/25 PCP: Orma Flaming, MD     Subjective:  Chief Complaint  Patient presents with  . Hyperlipidemia  . Diabetes  . Hypertension  . Fall    Left side injury    HPI: The patient is a 74 y.o. female who presents today for Transition of Care.  Hypertension Hypertension: Here for follow up of hypertension.  Currently on dyazide 37-25mg  . Takes medication as prescribed and denies any side effects. Exercise includes none. Weight has been stable. Denies any chest pain, headaches, shortness of breath, vision changes, swelling in lower extremities.   Diabetes type 2 Diabetes: Patient is here for follow up of type 2 diabetes. First diagnosed 06/2016. Currently on the following medications-none. Last A1C was 7.9 in august of 2020. Not exercising and not following a diabetic diet.  Sugars range from 140 to 170. Denies any hypoglycemic events. Denies any vision changes, nausea, vomiting, abdominal pain, ulcers/paraesthesia in feet, polyuria, polydipsia or polyphagia. Denies any chest pain, shortness of breath.   Hyperlipidemia Currently on pravachol 40mg . Takes this daily. No side effects or issues. Her father did have a MI. She does not smoke, but does have diabetes.   The 10-year ASCVD risk score Mikey Bussing DC Brooke Bonito., et al., 2013) is: Juliann Pares at beginning of April. Did go and see her ortho after seeing Dr. Yong Channel. She is doing better. No longer needing pain pills. Still needing cane or walker to get around.   Review of Systems  Constitutional: Positive for fatigue. Negative for chills and fever.  HENT: Negative for dental problem, ear pain, hearing loss and trouble swallowing.   Eyes: Negative for visual disturbance.  Respiratory: Negative for cough, chest tightness, shortness of breath and wheezing.   Cardiovascular: Negative for chest pain, palpitations and leg swelling.  Gastrointestinal: Negative for abdominal pain, blood in stool,  diarrhea and nausea.  Endocrine: Negative for cold intolerance, polydipsia, polyphagia and polyuria.  Genitourinary: Negative for dysuria and hematuria.  Musculoskeletal: Positive for arthralgias.  Skin: Negative for rash.  Neurological: Negative for dizziness, light-headedness and headaches.  Psychiatric/Behavioral: Negative for dysphoric mood and sleep disturbance. The patient is not nervous/anxious.     Allergies Patient is allergic to probiotic [acidophilus].  Past Medical History Patient  has a past medical history of Anemia, Cataract, Diabetes mellitus without complication ([place]), Gastric ulcer (11/17/2011), History of total left hip replacement (2011), HTN (hypertension), total knee replacement (2017), Hyperlipemia, Hyperplastic polyp of intestine (11/17/2011), Hypertension, Measles, Menopause, Migraine, and Osteoarthritis.  Surgical History Patient  has a past surgical history that includes Total vaginal hysterectomy (2012); Cataract extraction (Bilateral); Retinal detachment surgery; Hip Arthroplasty (Left, 2011); Joint replacement; Total knee arthroplasty (Right, 11/03/2015); and Carpal tunnel release (Right).  Family History Pateint's family history includes Diabetes in her mother, sister, and sister; Heart attack in her father and mother; Kidney cancer in her father.  Social History Patient  reports that she has never smoked. She has never used smokeless tobacco. She reports that she does not drink alcohol or use drugs.    Objective: Vitals:   07/12/19 1319 07/12/19 1349  BP: 98/60 130/70  Pulse: 79   Temp: 97.6 F (36.4 C)   TempSrc: Temporal   SpO2: 98%   Weight: 141 lb 6.4 oz (64.1 kg)   Height: 5' (1.524 m)     Body mass index is 27.62 kg/m.  Physical Exam Vitals reviewed.  Constitutional:      Appearance: Normal appearance. She is  well-developed and normal weight.     Comments: Sitting in [place]  HENT:     Head: Normocephalic and atraumatic.     Right Ear:  External ear normal.     Left Ear: External ear normal.  Eyes:     Conjunctiva/sclera: Conjunctivae normal.     Pupils: Pupils are equal, round, and reactive to light.  Neck:     Thyroid: No thyromegaly.     Vascular: No carotid bruit.  Cardiovascular:     Rate and Rhythm: Normal rate and regular rhythm.     Pulses: Normal pulses.     Heart sounds: Normal heart sounds. No murmur.  Pulmonary:     Effort: Pulmonary effort is normal.     Breath sounds: Normal breath sounds.  Abdominal:     General: Abdomen is flat. Bowel sounds are normal. There is no distension.     Palpations: Abdomen is soft.     Tenderness: There is no abdominal tenderness.  Musculoskeletal:     Cervical back: Normal range of motion and neck supple.  Lymphadenopathy:     Cervical: No cervical adenopathy.  Skin:    General: Skin is warm and dry.     Capillary Refill: Capillary refill takes less than 2 seconds.     Findings: No rash.  Neurological:     General: No focal deficit present.     Mental Status: She is alert and oriented to person, place, and time.     Cranial Nerves: No cranial nerve deficit.     Coordination: Coordination normal.     Deep Tendon Reflexes: Reflexes normal.  Psychiatric:        Mood and Affect: Mood normal.        Behavior: Behavior normal.      Video Visit from 06/22/2019 in [facility]  PHQ-2 Total Score  0          Assessment/plan: 1. Hypertension associated with diabetes ([place]) Blood pressure is to goal. Continue current anti-hypertensive medications. Refills not given and routine lab work will be done today. Recommended routine exercise and healthy diet including DASH diet and mediterranean diet. Encouraged weight loss. F/u in 6 months.    2. Hyperlipidemia associated with type 2 diabetes mellitus ([place])  - Lipid panel  3. Type 2 diabetes mellitus with hyperglycemia, without long-term current use of insulin ([facility]) A1c not ideal over 8 months ago.  Discussed goal for her around 7.5-7.0 at her age. Also discussed must monitor at least every 6 months. I know covid has really thrown things off. She admits to eating poorly. Reviewing her chart her a1c was 5.8 back in 2018. Will see what her labs are and then go from there. She is going to try and work harder on diet. Needs eye exam and foot exam. F/u in 3 months.  - CBC with Differential/Platelet - Comprehensive metabolic panel - Hemoglobin A1c - Microalbumin / creatinine urine ratio    This visit occurred during the SARS-CoV-2 public health emergency.  Safety protocols were in place, including screening questions prior to the visit, additional usage of staff PPE, and extensive cleaning of exam room while observing appropriate contact time as indicated for disinfecting solutions.     Return in about 3 months (around 10/11/2019) for diabetes .   Orma Flaming, MD [place]   07/12/2019

## 2019-07-13 DIAGNOSIS — E1165 Type 2 diabetes mellitus with hyperglycemia: Secondary | ICD-10-CM | POA: Diagnosis not present

## 2019-07-13 NOTE — Addendum Note (Signed)
Addended by: Christiana Fuchs on: 07/13/2019 01:49 PM   Modules accepted: Orders

## 2019-07-13 NOTE — Addendum Note (Signed)
Addended by: Christiana Fuchs on: 07/13/2019 03:52 PM   Modules accepted: Orders

## 2019-07-14 LAB — MICROALBUMIN / CREATININE URINE RATIO
Creatinine, Urine: 87 mg/dL (ref 20–275)
Microalb Creat Ratio: 11 mcg/mg creat (ref <30)
Microalb, Ur: 1 mg/dL

## 2019-07-20 ENCOUNTER — Telehealth: Payer: Self-pay

## 2019-07-20 NOTE — Telephone Encounter (Signed)
Patient is calling in regards to medication compounded estrodial cream. [facility]. Patient would like the prescription sent to [facility] in [place].

## 2019-07-20 NOTE — Telephone Encounter (Signed)
Call made to patient to inform her verbal order was made to [facility] for estradiol 0.02%

## 2019-07-20 NOTE — Telephone Encounter (Signed)
Patient is calling in regards to

## 2019-07-25 ENCOUNTER — Other Ambulatory Visit: Payer: Self-pay | Admitting: Family Medicine

## 2019-07-25 DIAGNOSIS — J069 Acute upper respiratory infection, unspecified: Secondary | ICD-10-CM

## 2019-08-01 ENCOUNTER — Telehealth: Payer: Self-pay | Admitting: Family Medicine

## 2019-08-01 NOTE — Telephone Encounter (Signed)
Patient is calling in stating that her and Dr.Wolfe discussed getting a cream that could help with sore joints, but she forgot the name of it so she wanted to know if someone could send her a mychart message with that information.

## 2019-08-02 ENCOUNTER — Encounter: Payer: Self-pay | Admitting: Family Medicine

## 2019-08-02 NOTE — Telephone Encounter (Signed)
My chart message sent with requested information.  Orma Flaming, MD [place]

## 2019-08-11 ENCOUNTER — Encounter: Payer: Self-pay | Admitting: Family Medicine

## 2019-08-14 ENCOUNTER — Encounter: Payer: Self-pay | Admitting: Family Medicine

## 2019-08-17 DIAGNOSIS — M7061 Trochanteric bursitis, right hip: Secondary | ICD-10-CM | POA: Diagnosis not present

## 2019-08-17 DIAGNOSIS — M25551 Pain in right hip: Secondary | ICD-10-CM | POA: Diagnosis not present

## 2019-08-17 DIAGNOSIS — M1611 Unilateral primary osteoarthritis, right hip: Secondary | ICD-10-CM | POA: Diagnosis not present

## 2019-08-23 ENCOUNTER — Other Ambulatory Visit: Payer: Self-pay | Admitting: Family Medicine

## 2019-08-23 DIAGNOSIS — E1165 Type 2 diabetes mellitus with hyperglycemia: Secondary | ICD-10-CM

## 2019-09-04 ENCOUNTER — Telehealth: Payer: Self-pay | Admitting: Obstetrics and Gynecology

## 2019-09-04 NOTE — Telephone Encounter (Signed)
Patient cancelled her 3 month recheck appointment because she is out of town. Would like to reschedule for 2 weeks out. Sending to triage to assist with scheduling.

## 2019-09-04 NOTE — Telephone Encounter (Signed)
Spoke with patient. OV rescheduled for pessary recheck on 10/10/19 at 4:30pm with Dr. Talbert Nan. Patient is agreeable to date and time.   Encounter closed.

## 2019-09-05 ENCOUNTER — Ambulatory Visit: Payer: Medicare Other | Admitting: Obstetrics and Gynecology

## 2019-09-05 ENCOUNTER — Encounter: Payer: Self-pay | Admitting: Family Medicine

## 2019-10-10 ENCOUNTER — Encounter: Payer: Self-pay | Admitting: Obstetrics and Gynecology

## 2019-10-10 ENCOUNTER — Ambulatory Visit: Payer: Medicare Other | Admitting: Obstetrics and Gynecology

## 2019-10-10 ENCOUNTER — Other Ambulatory Visit: Payer: Self-pay

## 2019-10-10 VITALS — BP 112/70 | HR 84 | Resp 12 | Ht 60.0 in | Wt 144.0 lb

## 2019-10-10 DIAGNOSIS — N952 Postmenopausal atrophic vaginitis: Secondary | ICD-10-CM | POA: Diagnosis not present

## 2019-10-10 DIAGNOSIS — N819 Female genital prolapse, unspecified: Secondary | ICD-10-CM | POA: Diagnosis not present

## 2019-10-10 DIAGNOSIS — Z4689 Encounter for fitting and adjustment of other specified devices: Secondary | ICD-10-CM

## 2019-10-10 DIAGNOSIS — N899 Noninflammatory disorder of vagina, unspecified: Secondary | ICD-10-CM

## 2019-10-10 DIAGNOSIS — N8111 Cystocele, midline: Secondary | ICD-10-CM | POA: Diagnosis not present

## 2019-10-10 DIAGNOSIS — N898 Other specified noninflammatory disorders of vagina: Secondary | ICD-10-CM

## 2019-10-10 NOTE — Progress Notes (Signed)
GYNECOLOGY  VISIT   HPI: 74 y.o.   Divorced White or Caucasian Not Hispanic or Latino  female   [phone] with No LMP recorded (lmp unknown). Patient has had a hysterectomy.   here for pessary check. She has a h/o a TVH and uses a pessary to control a grade 3 cystocele and grade 1 vault prolapse. She is only using her pessary intermittently secondary to a h/o vaginal irritation. She uses vaginal estrogen 2 x a week.   She has been using the pessary ~4 x a week, leaves it in for the day. Starting to feel like it is slipping some. She had a little spotting the end of last week when she was removing the pessary, thinks she may have scratched herself.  Bowel and bladder are working fine.   GYNECOLOGIC HISTORY: No LMP recorded (lmp unknown). Patient has had a hysterectomy. Contraception:Hysterectomy Menopausal hormone therapy: Estradiol        OB History    Gravida  2   Para  2   Term  2   Preterm      AB      Living  1     SAB      TAB      Ectopic      Multiple      Live Births  2              Patient Active Problem List   Diagnosis Date Noted  . Aortic atherosclerosis ([place]) 06/22/2019  . Type 2 diabetes mellitus with hyperglycemia, without long-term current use of insulin ([place]) 06/18/2016  . OA (osteoarthritis) of knee 11/03/2015  . Hyperlipidemia associated with type 2 diabetes mellitus ([place]) 07/23/2014  . Genu valgum 08/11/2012  . Hypertension associated with diabetes ([place]) 02/25/2010    Past Medical History:  Diagnosis Date  . Anemia   . Cataract   . Diabetes mellitus without complication ([place])   . Gastric ulcer 11/17/2011  . History of total left hip replacement 2011  . HTN (hypertension)   . Hx of total knee replacement 2017   11-03-2015   . Hyperlipemia   . Hyperplastic polyp of intestine 11/17/2011  . Hypertension   . Measles   . Menopause   . Migraine   . Osteoarthritis     Past Surgical History:  Procedure Laterality Date  . CARPAL TUNNEL  RELEASE Right   . CATARACT EXTRACTION Bilateral   . HIP ARTHROPLASTY Left 2011  . JOINT REPLACEMENT    . RETINAL DETACHMENT SURGERY    . TOTAL KNEE ARTHROPLASTY Right 11/03/2015   Procedure: RIGHT TOTAL KNEE ARTHROPLASTY;  Surgeon: Gaynelle Arabian, MD;  Location: [facility];  Service: Orthopedics;  Laterality: Right;  . TOTAL VAGINAL HYSTERECTOMY  2012   TVH, anterior repair, TVT (no mesh used for the anterior repair)    Current Outpatient Medications  Medication Sig Dispense Refill  . Accu-Chek FastClix Lancets MISC Check blood sugars 1-2 times per day. 100 each 2  . aspirin EC 81 MG tablet Take 81 mg by mouth daily.    . Cyanocobalamin (B-12 PO) Take 1 tablet by mouth daily.    . fluticasone (FLONASE) 50 MCG/ACT nasal spray PLACE 1 SPRAY INTO BOTH NOSTRILS 2 (TWO) TIMES DAILY AS NEEDED FOR ALLERGIES OR RHINITIS. 48 mL 0  . glucose blood (ACCU-CHEK GUIDE) test strip TEST FOUR TIMES DAILY. 100 each 6  . ibuprofen (ADVIL) 200 MG tablet Take 400 mg by mouth every 6 (six) hours as needed for  headache or mild pain.    . meclizine (ANTIVERT) 25 MG tablet Take 1 tablet (25 mg total) by mouth 3 (three) times daily as needed for dizziness. 60 tablet 1  . Multiple Vitamin (MULTIVITAMIN WITH MINERALS) TABS tablet Take 1 tablet by mouth daily.    . NONFORMULARY OR COMPOUNDED ITEM Estradiol cream 0.02%, use 1/2 a gram vaginally 2 x a week at hs. 3 month supply sent, 3 refills 3 each 1  . pravastatin (PRAVACHOL) 40 MG tablet TAKE 1 TABLET BY MOUTH EVERY DAY (Patient taking differently: Take 40 mg by mouth daily. ) 90 tablet 0  . traZODone (DESYREL) 50 MG tablet TAKE 0.5-1 TABLETS BY MOUTH AT BEDTIME AS NEEDED FOR SLEEP. (Patient taking differently: Take 25-50 mg by mouth at bedtime as needed for sleep. ) 90 tablet 2  . triamterene-hydrochlorothiazide (DYAZIDE) 37.5-25 MG capsule Take 1 capsule po qd (Patient taking differently: Take 1 capsule by mouth daily. ) 90 capsule 3  . triamcinolone cream (KENALOG) 0.1 %  Apply 1 application topically 4 (four) times daily as needed (bug bites).  (Patient not taking: Reported on 10/10/2019)     No current facility-administered medications for this visit.     ALLERGIES: Probiotic [acidophilus]  Family History  Problem Relation Age of Onset  . Diabetes Mother        deceased  . Heart attack Mother   . Diabetes Sister        x2  . Kidney cancer Father   . Heart attack Father   . Diabetes Sister     Social History   Socioeconomic History  . Marital status: Divorced    Spouse name: Not on file  . Number of children: Not on file  . Years of education: Not on file  . Highest education level: Not on file  Occupational History  . Occupation: retired    Fish farm manager: RETIRED  Tobacco Use  . Smoking status: Never Smoker  . Smokeless tobacco: Never Used  Vaping Use  . Vaping Use: Never used  Substance and Sexual Activity  . Alcohol use: No  . Drug use: No  . Sexual activity: Not Currently    Partners: Male    Birth control/protection: Post-menopausal, Surgical  Other Topics Concern  . Not on file  Social History Narrative   Divorced, 2 children; retired.    Custody of grandchildren    Social Determinants of Health   Financial Resource Strain:   . Difficulty of Paying Living Expenses:   Food Insecurity:   . Worried About Charity fundraiser in the Last Year:   . Arboriculturist in the Last Year:   Transportation Needs:   . Film/video editor (Medical):   Marland Kitchen Lack of Transportation (Non-Medical):   Physical Activity:   . Days of Exercise per Week:   . Minutes of Exercise per Session:   Stress:   . Feeling of Stress :   Social Connections:   . Frequency of Communication with Friends and Family:   . Frequency of Social Gatherings with Friends and Family:   . Attends Religious Services:   . Active Member of Clubs or Organizations:   . Attends Archivist Meetings:   Marland Kitchen Marital Status:   Intimate Partner Violence:   . Fear of  Current or Ex-Partner:   . Emotionally Abused:   Marland Kitchen Physically Abused:   . Sexually Abused:     Review of Systems  Constitutional: Negative.   HENT: Negative.  Eyes: Negative.   Respiratory: Negative.   Cardiovascular: Negative.   Gastrointestinal: Negative.   Genitourinary: Negative.   Musculoskeletal: Negative.   Skin: Negative.   Neurological: Negative.   Endo/Heme/Allergies: Negative.   Psychiatric/Behavioral: Negative.     PHYSICAL EXAMINATION:    BP 112/70 (BP Location: Right Arm, Patient Position: Sitting, Cuff Size: Normal)   Pulse 84   Resp 12   Ht 5' (1.524 m)   Wt 144 lb (65.3 kg)   LMP  (LMP Unknown)   BMI 28.12 kg/m     General appearance: alert, cooperative and appears stated age   Pelvic: External genitalia:  no lesions              Urethra:  normal appearing urethra with no masses, tenderness or lesions              Bartholins and Skenes: normal                 Vagina: the pessary was removed and cleaned. She does have irritation/slight ulceration on the posterior vaginal wall. The pessary was left out              Cervix:  no lesions  Chaperone was present for exam.  ASSESSMENT Genital prolapse, recurrent vaginal irritation from the ring pessary Vaginal atrophy    PLAN Pessary left out Increase vaginal estrogen to every other day for 2 weeks F/U in 2 weeks and will fit with a cube pessary

## 2019-10-14 ENCOUNTER — Other Ambulatory Visit: Payer: Self-pay | Admitting: Family Medicine

## 2019-10-14 DIAGNOSIS — F5101 Primary insomnia: Secondary | ICD-10-CM

## 2019-10-15 ENCOUNTER — Ambulatory Visit: Payer: Medicare Other | Admitting: Family Medicine

## 2019-10-18 ENCOUNTER — Other Ambulatory Visit: Payer: Self-pay | Admitting: Family Medicine

## 2019-10-18 DIAGNOSIS — J069 Acute upper respiratory infection, unspecified: Secondary | ICD-10-CM

## 2019-10-24 ENCOUNTER — Ambulatory Visit: Payer: Medicare Other | Admitting: Family Medicine

## 2019-11-01 ENCOUNTER — Other Ambulatory Visit: Payer: Self-pay | Admitting: Family Medicine

## 2019-11-01 DIAGNOSIS — E1165 Type 2 diabetes mellitus with hyperglycemia: Secondary | ICD-10-CM

## 2019-11-05 ENCOUNTER — Ambulatory Visit: Payer: Medicare Other | Admitting: Obstetrics and Gynecology

## 2019-11-09 ENCOUNTER — Ambulatory Visit: Payer: Medicare Other | Admitting: Obstetrics and Gynecology

## 2019-11-09 ENCOUNTER — Other Ambulatory Visit: Payer: Self-pay

## 2019-11-09 ENCOUNTER — Encounter: Payer: Self-pay | Admitting: Obstetrics and Gynecology

## 2019-11-09 VITALS — BP 126/78 | HR 88 | Resp 16 | Wt 144.0 lb

## 2019-11-09 DIAGNOSIS — N8111 Cystocele, midline: Secondary | ICD-10-CM

## 2019-11-09 DIAGNOSIS — N952 Postmenopausal atrophic vaginitis: Secondary | ICD-10-CM | POA: Diagnosis not present

## 2019-11-09 DIAGNOSIS — N819 Female genital prolapse, unspecified: Secondary | ICD-10-CM | POA: Diagnosis not present

## 2019-11-09 NOTE — Progress Notes (Signed)
GYNECOLOGY  VISIT   HPI: 74 y.o.   Divorced White or Caucasian Not Hispanic or Latino  female   [phone] with No LMP recorded (lmp unknown). Patient has had a hysterectomy.   History of grade 3 cystocele and grade 1 vault prolapse. She had recurrent vaginal irritation with the ring pessary. Here for fitting of a cube pessary. She is using vaginal estrogen.  She is leaking urine on the way to the bathroom in the am.    GYNECOLOGIC HISTORY: No LMP recorded (lmp unknown). Patient has had a hysterectomy. Contraception:Hysterectomy Menopausal hormone therapy: none        OB History    Gravida  2   Para  2   Term  2   Preterm      AB      Living  1     SAB      TAB      Ectopic      Multiple      Live Births  2              Patient Active Problem List   Diagnosis Date Noted  . Aortic atherosclerosis ([place]) 06/22/2019  . Type 2 diabetes mellitus with hyperglycemia, without long-term current use of insulin ([place]) 06/18/2016  . OA (osteoarthritis) of knee 11/03/2015  . Hyperlipidemia associated with type 2 diabetes mellitus ([place]) 07/23/2014  . Genu valgum 08/11/2012  . Hypertension associated with diabetes ([place]) 02/25/2010    Past Medical History:  Diagnosis Date  . Anemia   . Cataract   . Diabetes mellitus without complication ([place])   . Gastric ulcer 11/17/2011  . History of total left hip replacement 2011  . HTN (hypertension)   . Hx of total knee replacement 2017   11-03-2015   . Hyperlipemia   . Hyperplastic polyp of intestine 11/17/2011  . Hypertension   . Measles   . Menopause   . Migraine   . Osteoarthritis     Past Surgical History:  Procedure Laterality Date  . CARPAL TUNNEL RELEASE Right   . CATARACT EXTRACTION Bilateral   . HIP ARTHROPLASTY Left 2011  . JOINT REPLACEMENT    . RETINAL DETACHMENT SURGERY    . TOTAL KNEE ARTHROPLASTY Right 11/03/2015   Procedure: RIGHT TOTAL KNEE ARTHROPLASTY;  Surgeon: Gaynelle Arabian, MD;  Location: [facility];   Service: Orthopedics;  Laterality: Right;  . TOTAL VAGINAL HYSTERECTOMY  2012   TVH, anterior repair, TVT (no mesh used for the anterior repair)    Current Outpatient Medications  Medication Sig Dispense Refill  . Accu-Chek FastClix Lancets MISC Check blood sugars 1-2 times per day. 100 each 2  . aspirin EC 81 MG tablet Take 81 mg by mouth daily.    . Cyanocobalamin (B-12 PO) Take 1 tablet by mouth daily.    . fluticasone (FLONASE) 50 MCG/ACT nasal spray PLACE 1 SPRAY INTO BOTH NOSTRILS 2 (TWO) TIMES DAILY AS NEEDED FOR ALLERGIES OR RHINITIS. 48 mL 0  . glucose blood (ACCU-CHEK GUIDE) test strip TEST FOUR TIMES DAILY. 100 each 6  . ibuprofen (ADVIL) 200 MG tablet Take 400 mg by mouth every 6 (six) hours as needed for headache or mild pain.    . meclizine (ANTIVERT) 25 MG tablet Take 1 tablet (25 mg total) by mouth 3 (three) times daily as needed for dizziness. 60 tablet 1  . Multiple Vitamin (MULTIVITAMIN WITH MINERALS) TABS tablet Take 1 tablet by mouth daily.    . NONFORMULARY OR COMPOUNDED ITEM  Estradiol cream 0.02%, use 1/2 a gram vaginally 2 x a week at hs. 3 month supply sent, 3 refills 3 each 1  . pravastatin (PRAVACHOL) 40 MG tablet TAKE 1 TABLET BY MOUTH EVERY DAY 90 tablet 0  . traZODone (DESYREL) 50 MG tablet TAKE 1/2 TO 1 TABLET BY MOUTH AT BEDTIME AS NEEDED FOR SLEEP 90 tablet 2  . triamcinolone cream (KENALOG) 0.1 % Apply 1 application topically 4 (four) times daily as needed (bug bites).     . triamterene-hydrochlorothiazide (DYAZIDE) 37.5-25 MG capsule Take 1 capsule po qd (Patient taking differently: Take 1 capsule by mouth daily. ) 90 capsule 3   No current facility-administered medications for this visit.     ALLERGIES: Probiotic [acidophilus]  Family History  Problem Relation Age of Onset  . Diabetes Mother        deceased  . Heart attack Mother   . Diabetes Sister        x2  . Kidney cancer Father   . Heart attack Father   . Diabetes Sister     Social  History   Socioeconomic History  . Marital status: Divorced    Spouse name: Not on file  . Number of children: Not on file  . Years of education: Not on file  . Highest education level: Not on file  Occupational History  . Occupation: retired    Fish farm manager: RETIRED  Tobacco Use  . Smoking status: Never Smoker  . Smokeless tobacco: Never Used  Vaping Use  . Vaping Use: Never used  Substance and Sexual Activity  . Alcohol use: No  . Drug use: No  . Sexual activity: Not Currently    Partners: Male    Birth control/protection: Post-menopausal, Surgical  Other Topics Concern  . Not on file  Social History Narrative   Divorced, 2 children; retired.    Custody of grandchildren    Social Determinants of Health   Financial Resource Strain:   . Difficulty of Paying Living Expenses: Not on file  Food Insecurity:   . Worried About Charity fundraiser in the Last Year: Not on file  . Ran Out of Food in the Last Year: Not on file  Transportation Needs:   . Lack of Transportation (Medical): Not on file  . Lack of Transportation (Non-Medical): Not on file  Physical Activity:   . Days of Exercise per Week: Not on file  . Minutes of Exercise per Session: Not on file  Stress:   . Feeling of Stress : Not on file  Social Connections:   . Frequency of Communication with Friends and Family: Not on file  . Frequency of Social Gatherings with Friends and Family: Not on file  . Attends Religious Services: Not on file  . Active Member of Clubs or Organizations: Not on file  . Attends Archivist Meetings: Not on file  . Marital Status: Not on file  Intimate Partner Violence:   . Fear of Current or Ex-Partner: Not on file  . Emotionally Abused: Not on file  . Physically Abused: Not on file  . Sexually Abused: Not on file    Review of Systems  Constitutional: Negative.   HENT: Negative.   Eyes: Negative.   Respiratory: Negative.   Cardiovascular: Negative.   Gastrointestinal:  Negative.   Genitourinary: Negative.   Musculoskeletal: Negative.   Skin: Negative.   Neurological: Negative.   Endo/Heme/Allergies: Negative.   Psychiatric/Behavioral: Negative.     PHYSICAL EXAMINATION:  BP 126/78 (BP Location: Right Arm, Patient Position: Sitting, Cuff Size: Normal)   Pulse 88   Resp 16   Wt 144 lb (65.3 kg)   LMP  (LMP Unknown)   BMI 28.12 kg/m     General appearance: alert, cooperative and appears stated age   Pelvic: External genitalia:  no lesions              Urethra:  normal appearing urethra with no masses, tenderness or lesions              Bartholins and Skenes: normal                 Vagina: atrophic appearing vagina with normal color and discharge, no lesions, grade 3 cystocele, grade 1 vault prolapse              Cervix: absent   She was fitted with a #3 cube pessary, then a #4 cube pessary, both felt comfortable               Chaperone was present for exam.  ASSESSMENT Genital prolapse, recurrent irritation from ring pessary Vaginal atrophy Vaginal irritation has resolved.  Incontinence, urge in the am.     PLAN Fitted with a #4 cube pessary, will order and have her return for insertion Continue vaginal estrogen 2 x a week Will see if the incontinence persists after placement of the cube pessary

## 2019-11-14 ENCOUNTER — Ambulatory Visit (INDEPENDENT_AMBULATORY_CARE_PROVIDER_SITE_OTHER): Payer: Medicare Other | Admitting: Family Medicine

## 2019-11-14 ENCOUNTER — Encounter: Payer: Self-pay | Admitting: Family Medicine

## 2019-11-14 ENCOUNTER — Other Ambulatory Visit: Payer: Self-pay

## 2019-11-14 VITALS — BP 122/76 | HR 82 | Temp 97.6°F | Ht 60.0 in | Wt 146.6 lb

## 2019-11-14 DIAGNOSIS — R05 Cough: Secondary | ICD-10-CM

## 2019-11-14 DIAGNOSIS — E1165 Type 2 diabetes mellitus with hyperglycemia: Secondary | ICD-10-CM

## 2019-11-14 DIAGNOSIS — R059 Cough, unspecified: Secondary | ICD-10-CM

## 2019-11-14 LAB — POCT GLYCOSYLATED HEMOGLOBIN (HGB A1C): Hemoglobin A1C: 8 % — AB (ref 4.0–5.6)

## 2019-11-14 MED ORDER — ALBUTEROL SULFATE HFA 108 (90 BASE) MCG/ACT IN AERS
2.0000 | INHALATION_SPRAY | Freq: Four times a day (QID) | RESPIRATORY_TRACT | 0 refills | Status: DC | PRN
Start: 2019-11-14 — End: 2019-12-08

## 2019-11-14 MED ORDER — METFORMIN HCL 500 MG PO TABS
500.0000 mg | ORAL_TABLET | Freq: Two times a day (BID) | ORAL | 3 refills | Status: DC
Start: 2019-11-14 — End: 2020-02-28

## 2019-11-14 NOTE — Progress Notes (Addendum)
Patient: Misty Cervantes MRN: [id number] DOB: Aug 23, 1945 PCP: Orma Flaming, MD     Subjective:  Chief Complaint  Patient presents with  . Diabetes  . Cough    HPI: The patient is a 74 y.o. female who presents today for Diabetes.   Diabetes type 2 Diabetes: Patient is here for follow up of type 2 diabetes. First diagnosed 06/2016. Currently on the following medications-none. Last A1C was 7.5 in April 2021. She was controlling with diet alone and was going to work on exercise. She continues to not do good with exercise or diet.  Sugars range from 150 to 200. Denies any hypoglycemic events. Denies any vision changes, nausea, vomiting, abdominal pain, ulcers/paraesthesia in feet, polyuria, polydipsia or polyphagia. Denies any chest pain, shortness of breath. she was on metformin in the past.   She does have a cough that has been sticking around for awhile. She had a cold in April and has been coughing since that time. Her cough is dry in nature. Wakes her up at night. She has been just eating like cough drops. Nothing seems to exacerbate the cough. She has done robitussin and delsym with minor relief. She is not short of breath and denies any wheezing. Denies hx or symptoms of reflux   Review of Systems  Respiratory: Positive for cough.   Cardiovascular: Negative for chest pain and palpitations.  Genitourinary: Negative for frequency and urgency.  Neurological: Positive for headaches. Negative for dizziness.    Allergies Patient is allergic to probiotic [acidophilus].  Past Medical History Patient  has a past medical history of Anemia, Cataract, Diabetes mellitus without complication ([place]), Gastric ulcer (11/17/2011), History of total left hip replacement (2011), HTN (hypertension), total knee replacement (2017), Hyperlipemia, Hyperplastic polyp of intestine (11/17/2011), Hypertension, Measles, Menopause, Migraine, and Osteoarthritis.  Surgical History Patient  has a past surgical  history that includes Total vaginal hysterectomy (2012); Cataract extraction (Bilateral); Retinal detachment surgery; Hip Arthroplasty (Left, 2011); Joint replacement; Total knee arthroplasty (Right, 11/03/2015); and Carpal tunnel release (Right).  Family History Pateint's family history includes Diabetes in her mother, sister, and sister; Heart attack in her father and mother; Kidney cancer in her father.  Social History Patient  reports that she has never smoked. She has never used smokeless tobacco. She reports that she does not drink alcohol and does not use drugs.    Objective: Vitals:   11/14/19 1035  BP: 122/76  Pulse: 82  Temp: 97.6 F (36.4 C)  TempSrc: Temporal  SpO2: 96%  Weight: 146 lb 9.6 oz (66.5 kg)  Height: 5' (1.524 m)    Body mass index is 28.63 kg/m.  Physical Exam Vitals reviewed.  Constitutional:      Appearance: Normal appearance. She is obese.     Comments: Hard of hearing   HENT:     Head: Normocephalic and atraumatic.     Right Ear: There is impacted cerumen.     Left Ear: Tympanic membrane, ear canal and external ear normal.     Mouth/Throat:     Comments: +cobblestoning on posterior pharynx  Cardiovascular:     Rate and Rhythm: Normal rate and regular rhythm.     Heart sounds: Normal heart sounds.  Pulmonary:     Effort: Pulmonary effort is normal. No respiratory distress.     Breath sounds: Normal breath sounds. No wheezing.  Abdominal:     General: Bowel sounds are normal.     Palpations: Abdomen is soft.  Skin:    Capillary  Refill: Capillary refill takes less than 2 seconds.  Neurological:     General: No focal deficit present.     Mental Status: She is alert and oriented to person, place, and time.  Psychiatric:        Mood and Affect: Mood normal.        Behavior: Behavior normal.    a1c 8.0     Assessment/plan: 1. Type 2 diabetes mellitus with hyperglycemia, without long-term current use of insulin (HCC) Worsening diabetic  control. a1c now 8.0. we are going to start her back on metformin low dose. She can not tolerate higher doses. Discussed I want her to take this with food twice a day. Again, really stressed she needs to work on diet and exercise and weight loss as she was diet controlled before. Will see her back in 3 months. Watch renal function. Foot exam next visit.  - POCT glycosylated hemoglobin (Hb A1C)  2. Cough ?UACS vs. PND vs. Silent GERD.  Starting nightly flonase and albuterol inhaler PRN. Checking CXR. If not better in a few weeks she is to let me know so we can send to pulm.    This visit occurred during the SARS-CoV-2 public health emergency.  Safety protocols were in place, including screening questions prior to the visit, additional usage of staff PPE, and extensive cleaning of exam room while observing appropriate contact time as indicated for disinfecting solutions.     Return in about 3 months (around 02/13/2020) for diabetes/htn/labs. Orma Flaming, MD [place]   11/14/2019

## 2019-11-14 NOTE — Patient Instructions (Addendum)
I have ordered xrays for you. At this time we do not have xrays in our clinic. You will have to go to our [place] clinic. The address is [address]  xray is located in the basement.  Hours of operation are M-F 8:30am to 5:00pm.  Closed for lunch between 12:30 and 1:00pm.   -for cough I want you to use flonase every night. Also sending in an inhaler to try to use for a thing called upper airway cough syndrome. Would try cool mist humidifier at night as well. If not better in a few weeks and xray looks good let me know so I can see to lung doctor.   For diabetes we are going to start back on metformin 500mg  twice a day. This is very low dose and I want you to take with food. Please let me know if you can't tolerate this. Really work on diet and exercise.   See you back in 3 months time! Dr. Rogers Blocker

## 2019-11-16 ENCOUNTER — Telehealth: Payer: Self-pay | Admitting: Family Medicine

## 2019-11-16 NOTE — Telephone Encounter (Signed)
I sent in albuterol inhaler for her. I want her to try this first.  Thanks! Dr. Rogers Blocker

## 2019-11-16 NOTE — Telephone Encounter (Signed)
Patient is questioning if she was told at her last appoitnment she was going to be prescribed some cough syrup and noticed when she picked up her medication there was not any. Patient is just calling to make sure she isnt suppose to have it, patient stated we can message her on my chart to let her know  Thank you

## 2019-11-17 ENCOUNTER — Other Ambulatory Visit: Payer: Self-pay | Admitting: Family Medicine

## 2019-11-17 DIAGNOSIS — E1165 Type 2 diabetes mellitus with hyperglycemia: Secondary | ICD-10-CM

## 2019-11-21 ENCOUNTER — Telehealth: Payer: Self-pay

## 2019-11-21 NOTE — Telephone Encounter (Signed)
Left message to call [place] at [phone].  Calling to schedule patient pessary placement with Dr.Jertson. Patient was received in office.

## 2019-11-22 ENCOUNTER — Telehealth: Payer: Self-pay | Admitting: Family Medicine

## 2019-11-22 DIAGNOSIS — E1165 Type 2 diabetes mellitus with hyperglycemia: Secondary | ICD-10-CM

## 2019-11-22 NOTE — Telephone Encounter (Signed)
..   LAST APPOINTMENT DATE: 11/17/2019   NEXT APPOINTMENT DATE:@12 /03/2019  MEDICATION:glucose blood (ACCU-CHEK GUIDE) test strip  PHARMACY:[facility] - [place],  - [address]  **Let patient know to contact pharmacy at the end of the day to make sure medication is ready. **  ** Please notify patient to allow 48-72 hours to process**  **Encourage patient to contact the pharmacy for refills or they can request refills through [facility]**  CLINICAL FILLS OUT ALL BELOW:   LAST REFILL:  QTY:  REFILL DATE:    OTHER COMMENTS:    Okay for refill?  Please advise

## 2019-11-22 NOTE — Telephone Encounter (Signed)
Patient is returning a call to Kaitlyn. °

## 2019-11-22 NOTE — Telephone Encounter (Signed)
Spoke with patient. Appointment scheduled for 11/27/2019 at 2 pm with Dr.Jertson.   Routing to provider and will close encounter.

## 2019-11-23 MED ORDER — ACCU-CHEK GUIDE VI STRP: ORAL_STRIP | 6 refills | Status: DC

## 2019-11-26 ENCOUNTER — Other Ambulatory Visit: Payer: Self-pay

## 2019-11-26 ENCOUNTER — Ambulatory Visit (HOSPITAL_COMMUNITY)
Admission: RE | Admit: 2019-11-26 | Discharge: 2019-11-26 | Disposition: A | Discharge status: Home / Self Care | Payer: Medicare Other | Source: Ambulatory Visit | Attending: Family Medicine | Admitting: Family Medicine

## 2019-11-26 DIAGNOSIS — R059 Cough, unspecified: Secondary | ICD-10-CM

## 2019-11-26 DIAGNOSIS — R05 Cough: Secondary | ICD-10-CM | POA: Insufficient documentation

## 2019-11-26 DIAGNOSIS — I10 Essential (primary) hypertension: Secondary | ICD-10-CM | POA: Diagnosis not present

## 2019-11-26 DIAGNOSIS — J9811 Atelectasis: Secondary | ICD-10-CM | POA: Diagnosis not present

## 2019-11-27 ENCOUNTER — Encounter: Payer: Self-pay | Admitting: Obstetrics and Gynecology

## 2019-11-27 ENCOUNTER — Ambulatory Visit: Payer: Medicare Other | Admitting: Obstetrics and Gynecology

## 2019-11-27 ENCOUNTER — Telehealth: Payer: Self-pay | Admitting: Obstetrics and Gynecology

## 2019-11-27 VITALS — BP 124/68 | HR 66 | Ht 60.0 in | Wt 144.8 lb

## 2019-11-27 DIAGNOSIS — N952 Postmenopausal atrophic vaginitis: Secondary | ICD-10-CM | POA: Diagnosis not present

## 2019-11-27 DIAGNOSIS — N8111 Cystocele, midline: Secondary | ICD-10-CM

## 2019-11-27 NOTE — Telephone Encounter (Signed)
Spoke with patient.  OV scheduled for 12/04/19 at 3pm.  Patient agreeable to date and time.  Encounter closed.

## 2019-11-27 NOTE — Telephone Encounter (Signed)
Dr. Talbert Nan would like to see patient next Tuesday for pessary check. Sending to triage to assist with scheduling.

## 2019-11-27 NOTE — Progress Notes (Signed)
GYNECOLOGY  VISIT   HPI: 74 y.o.   Divorced White or Caucasian Not Hispanic or Latino  female   [phone] with No LMP recorded (lmp unknown). Patient has had a hysterectomy.   here for pessary insertion. She was fitted with a #4 cube pessary a few weeks ago and is here for insertion. She has been having recurrent irritation with her ring pessary. She does use vaginal estrogen. She has a h/o grade 3 cystocele and grade 1 vault prolapse.  She has had a cough for the last 4 month. Not productive, worse at night. Post nasal drip.   She has had her covid vaccination, hasn't been tested lately. She had covid in 1/21, felt terrible then.   GYNECOLOGIC HISTORY: No LMP recorded (lmp unknown). Patient has had a hysterectomy. Contraception:none  Menopausal hormone therapy: estradiol        OB History    Gravida  2   Para  2   Term  2   Preterm      AB      Living  1     SAB      TAB      Ectopic      Multiple      Live Births  2              Patient Active Problem List   Diagnosis Date Noted  . Aortic atherosclerosis ([place]) 06/22/2019  . Type 2 diabetes mellitus with hyperglycemia, without long-term current use of insulin ([place]) 06/18/2016  . OA (osteoarthritis) of knee 11/03/2015  . Hyperlipidemia associated with type 2 diabetes mellitus ([place]) 07/23/2014  . Genu valgum 08/11/2012  . Hypertension associated with diabetes ([place]) 02/25/2010    Past Medical History:  Diagnosis Date  . Anemia   . Cataract   . Diabetes mellitus without complication ([place])   . Gastric ulcer 11/17/2011  . History of total left hip replacement 2011  . HTN (hypertension)   . Hx of total knee replacement 2017   11-03-2015   . Hyperlipemia   . Hyperplastic polyp of intestine 11/17/2011  . Hypertension   . Measles   . Menopause   . Migraine   . Osteoarthritis     Past Surgical History:  Procedure Laterality Date  . CARPAL TUNNEL RELEASE Right   . CATARACT EXTRACTION Bilateral   . HIP  ARTHROPLASTY Left 2011  . JOINT REPLACEMENT    . RETINAL DETACHMENT SURGERY    . TOTAL KNEE ARTHROPLASTY Right 11/03/2015   Procedure: RIGHT TOTAL KNEE ARTHROPLASTY;  Surgeon: Gaynelle Arabian, MD;  Location: [facility];  Service: Orthopedics;  Laterality: Right;  . TOTAL VAGINAL HYSTERECTOMY  2012   TVH, anterior repair, TVT (no mesh used for the anterior repair)    Current Outpatient Medications  Medication Sig Dispense Refill  . Accu-Chek FastClix Lancets MISC Check blood sugars 1-2 times per day. 100 each 2  . albuterol (VENTOLIN HFA) 108 (90 Base) MCG/ACT inhaler Inhale 2 puffs into the lungs every 6 (six) hours as needed for wheezing or shortness of breath. 8 g 0  . aspirin EC 81 MG tablet Take 81 mg by mouth daily.    . Cyanocobalamin (B-12 PO) Take 1 tablet by mouth daily.    . fluticasone (FLONASE) 50 MCG/ACT nasal spray PLACE 1 SPRAY INTO BOTH NOSTRILS 2 (TWO) TIMES DAILY AS NEEDED FOR ALLERGIES OR RHINITIS. 48 mL 0  . glucose blood (ACCU-CHEK GUIDE) test strip TEST FOUR TIMES DAILY. 100 each 6  .  ibuprofen (ADVIL) 200 MG tablet Take 400 mg by mouth every 6 (six) hours as needed for headache or mild pain.    . meclizine (ANTIVERT) 25 MG tablet Take 1 tablet (25 mg total) by mouth 3 (three) times daily as needed for dizziness. 60 tablet 1  . metFORMIN (GLUCOPHAGE) 500 MG tablet Take 1 tablet (500 mg total) by mouth 2 (two) times daily with a meal. 180 tablet 3  . Multiple Vitamin (MULTIVITAMIN WITH MINERALS) TABS tablet Take 1 tablet by mouth daily.    . NONFORMULARY OR COMPOUNDED ITEM Estradiol cream 0.02%, use 1/2 a gram vaginally 2 x a week at hs. 3 month supply sent, 3 refills 3 each 1  . pravastatin (PRAVACHOL) 40 MG tablet TAKE 1 TABLET BY MOUTH EVERY DAY 90 tablet 0  . traZODone (DESYREL) 50 MG tablet TAKE 1/2 TO 1 TABLET BY MOUTH AT BEDTIME AS NEEDED FOR SLEEP 90 tablet 2  . triamcinolone cream (KENALOG) 0.1 % Apply 1 application topically 4 (four) times daily as needed (bug bites).      . triamterene-hydrochlorothiazide (DYAZIDE) 37.5-25 MG capsule Take 1 capsule po qd (Patient taking differently: Take 1 capsule by mouth daily. ) 90 capsule 3   No current facility-administered medications for this visit.     ALLERGIES: Probiotic [acidophilus]  Family History  Problem Relation Age of Onset  . Diabetes Mother        deceased  . Heart attack Mother   . Diabetes Sister        x2  . Kidney cancer Father   . Heart attack Father   . Diabetes Sister     Social History   Socioeconomic History  . Marital status: Divorced    Spouse name: Not on file  . Number of children: Not on file  . Years of education: Not on file  . Highest education level: Not on file  Occupational History  . Occupation: retired    Fish farm manager: RETIRED  Tobacco Use  . Smoking status: Never Smoker  . Smokeless tobacco: Never Used  Vaping Use  . Vaping Use: Never used  Substance and Sexual Activity  . Alcohol use: No  . Drug use: No  . Sexual activity: Not Currently    Partners: Male    Birth control/protection: Post-menopausal, Surgical  Other Topics Concern  . Not on file  Social History Narrative   Divorced, 2 children; retired.    Custody of grandchildren    Social Determinants of Health   Financial Resource Strain:   . Difficulty of Paying Living Expenses: Not on file  Food Insecurity:   . Worried About Charity fundraiser in the Last Year: Not on file  . Ran Out of Food in the Last Year: Not on file  Transportation Needs:   . Lack of Transportation (Medical): Not on file  . Lack of Transportation (Non-Medical): Not on file  Physical Activity:   . Days of Exercise per Week: Not on file  . Minutes of Exercise per Session: Not on file  Stress:   . Feeling of Stress : Not on file  Social Connections:   . Frequency of Communication with Friends and Family: Not on file  . Frequency of Social Gatherings with Friends and Family: Not on file  . Attends Religious Services: Not  on file  . Active Member of Clubs or Organizations: Not on file  . Attends Archivist Meetings: Not on file  . Marital Status: Not on file  Intimate Partner Violence:   . Fear of Current or Ex-Partner: Not on file  . Emotionally Abused: Not on file  . Physically Abused: Not on file  . Sexually Abused: Not on file    ROS  PHYSICAL EXAMINATION:    BP 124/68   Pulse 66   Ht 5' (1.524 m)   Wt 144 lb 12.8 oz (65.7 kg)   LMP  (LMP Unknown)   SpO2 100%   BMI 28.28 kg/m     General appearance: alert, cooperative and appears stated age   Pelvic: External genitalia:  no lesions              Urethra:  normal appearing urethra with no masses, tenderness or lesions              Bartholins and Skenes: normal                 Vagina: mildly atrophic appearing vagina with normal color and discharge. She has a small laceration on the right mid vaginal wall, under one cm. The #4 cube pessary was placed              Cervix: absent  Chaperone was present for exam.  ASSESSMENT Genital prolapse, recurrent vaginal irritation from the ring pessary    PLAN The cube pessary was placed Continue with 2 x a week vaginal estrogen F/U next week.

## 2019-11-29 ENCOUNTER — Telehealth: Payer: Self-pay

## 2019-11-29 NOTE — Telephone Encounter (Signed)
Left message to call Domenica Weightman, RN at [facility] [phone].   

## 2019-11-29 NOTE — Telephone Encounter (Signed)
Patient is calling to reschedule pessary check, no available appointments need triage to assist.

## 2019-11-29 NOTE — Telephone Encounter (Signed)
Spoke with patient, reports no concerns with pessary. Has another appt out of town on 9/21, needs to r/s. Advised I will review schedule with Dr. Talbert Nan and return call, patient request detailed message with appt information to be left on voicemail.   Reviewed with Dr. Talbert Nan.  Call returned to patient. OV r/s to 12/03/19 at 10:45am, arrive at 10:30am.  Return call to office if any additional questions.  Encounter closed.

## 2019-11-30 ENCOUNTER — Other Ambulatory Visit: Payer: Self-pay

## 2019-11-30 ENCOUNTER — Encounter: Payer: Self-pay | Admitting: Family Medicine

## 2019-11-30 DIAGNOSIS — R059 Cough, unspecified: Secondary | ICD-10-CM

## 2019-12-03 ENCOUNTER — Ambulatory Visit: Payer: Medicare Other | Admitting: Obstetrics and Gynecology

## 2019-12-03 ENCOUNTER — Other Ambulatory Visit: Payer: Self-pay

## 2019-12-03 ENCOUNTER — Encounter: Payer: Self-pay | Admitting: Obstetrics and Gynecology

## 2019-12-03 VITALS — BP 110/70 | HR 104 | Resp 14 | Ht 60.0 in | Wt 144.0 lb

## 2019-12-03 DIAGNOSIS — Z4689 Encounter for fitting and adjustment of other specified devices: Secondary | ICD-10-CM

## 2019-12-03 DIAGNOSIS — N8111 Cystocele, midline: Secondary | ICD-10-CM | POA: Diagnosis not present

## 2019-12-03 NOTE — Progress Notes (Signed)
GYNECOLOGY  VISIT   HPI: 74 y.o.   Divorced White or Caucasian Not Hispanic or Latino  female   [phone] with No LMP recorded (lmp unknown). Patient has had a hysterectomy.   here for 1 week pessary check, she had a #4 cube pessary placed last week. She was a little sore the first day or two from the insertion. It is comfortable, holding her prolapse well. Normal bowel or bladder function, no bleeding.   GYNECOLOGIC HISTORY: No LMP recorded (lmp unknown). Patient has had a hysterectomy. Contraception:hysterectomy Menopausal hormone therapy: Estradiol        OB History    Gravida  2   Para  2   Term  2   Preterm      AB      Living  1     SAB      TAB      Ectopic      Multiple      Live Births  2              Patient Active Problem List   Diagnosis Date Noted  . Aortic atherosclerosis ([place]) 06/22/2019  . Type 2 diabetes mellitus with hyperglycemia, without long-term current use of insulin ([place]) 06/18/2016  . OA (osteoarthritis) of knee 11/03/2015  . Hyperlipidemia associated with type 2 diabetes mellitus ([place]) 07/23/2014  . Genu valgum 08/11/2012  . Hypertension associated with diabetes ([place]) 02/25/2010    Past Medical History:  Diagnosis Date  . Anemia   . Cataract   . Diabetes mellitus without complication ([place])   . Gastric ulcer 11/17/2011  . History of total left hip replacement 2011  . HTN (hypertension)   . Hx of total knee replacement 2017   11-03-2015   . Hyperlipemia   . Hyperplastic polyp of intestine 11/17/2011  . Hypertension   . Measles   . Menopause   . Migraine   . Osteoarthritis     Past Surgical History:  Procedure Laterality Date  . CARPAL TUNNEL RELEASE Right   . CATARACT EXTRACTION Bilateral   . HIP ARTHROPLASTY Left 2011  . JOINT REPLACEMENT    . RETINAL DETACHMENT SURGERY    . TOTAL KNEE ARTHROPLASTY Right 11/03/2015   Procedure: RIGHT TOTAL KNEE ARTHROPLASTY;  Surgeon: Gaynelle Arabian, MD;  Location: [facility];  Service:  Orthopedics;  Laterality: Right;  . TOTAL VAGINAL HYSTERECTOMY  2012   TVH, anterior repair, TVT (no mesh used for the anterior repair)    Current Outpatient Medications  Medication Sig Dispense Refill  . Accu-Chek FastClix Lancets MISC Check blood sugars 1-2 times per day. 100 each 2  . albuterol (VENTOLIN HFA) 108 (90 Base) MCG/ACT inhaler Inhale 2 puffs into the lungs every 6 (six) hours as needed for wheezing or shortness of breath. 8 g 0  . aspirin EC 81 MG tablet Take 81 mg by mouth daily.    . Cyanocobalamin (B-12 PO) Take 1 tablet by mouth daily.    . fluticasone (FLONASE) 50 MCG/ACT nasal spray PLACE 1 SPRAY INTO BOTH NOSTRILS 2 (TWO) TIMES DAILY AS NEEDED FOR ALLERGIES OR RHINITIS. 48 mL 0  . glucose blood (ACCU-CHEK GUIDE) test strip TEST FOUR TIMES DAILY. 100 each 6  . ibuprofen (ADVIL) 200 MG tablet Take 400 mg by mouth every 6 (six) hours as needed for headache or mild pain.    . meclizine (ANTIVERT) 25 MG tablet Take 1 tablet (25 mg total) by mouth 3 (three) times daily as needed for  dizziness. 60 tablet 1  . metFORMIN (GLUCOPHAGE) 500 MG tablet Take 1 tablet (500 mg total) by mouth 2 (two) times daily with a meal. 180 tablet 3  . Multiple Vitamin (MULTIVITAMIN WITH MINERALS) TABS tablet Take 1 tablet by mouth daily.    . NONFORMULARY OR COMPOUNDED ITEM Estradiol cream 0.02%, use 1/2 a gram vaginally 2 x a week at hs. 3 month supply sent, 3 refills 3 each 1  . pravastatin (PRAVACHOL) 40 MG tablet TAKE 1 TABLET BY MOUTH EVERY DAY 90 tablet 0  . traZODone (DESYREL) 50 MG tablet TAKE 1/2 TO 1 TABLET BY MOUTH AT BEDTIME AS NEEDED FOR SLEEP 90 tablet 2  . triamcinolone cream (KENALOG) 0.1 % Apply 1 application topically 4 (four) times daily as needed (bug bites).     . triamterene-hydrochlorothiazide (DYAZIDE) 37.5-25 MG capsule Take 1 capsule po qd (Patient taking differently: Take 1 capsule by mouth daily. ) 90 capsule 3   No current facility-administered medications for this  visit.     ALLERGIES: Probiotic [acidophilus]  Family History  Problem Relation Age of Onset  . Diabetes Mother        deceased  . Heart attack Mother   . Diabetes Sister        x2  . Kidney cancer Father   . Heart attack Father   . Diabetes Sister     Social History   Socioeconomic History  . Marital status: Divorced    Spouse name: Not on file  . Number of children: Not on file  . Years of education: Not on file  . Highest education level: Not on file  Occupational History  . Occupation: retired    Fish farm manager: RETIRED  Tobacco Use  . Smoking status: Never Smoker  . Smokeless tobacco: Never Used  Vaping Use  . Vaping Use: Never used  Substance and Sexual Activity  . Alcohol use: No  . Drug use: No  . Sexual activity: Not Currently    Partners: Male    Birth control/protection: Post-menopausal, Surgical  Other Topics Concern  . Not on file  Social History Narrative   Divorced, 2 children; retired.    Custody of grandchildren    Social Determinants of Health   Financial Resource Strain:   . Difficulty of Paying Living Expenses: Not on file  Food Insecurity:   . Worried About Charity fundraiser in the Last Year: Not on file  . Ran Out of Food in the Last Year: Not on file  Transportation Needs:   . Lack of Transportation (Medical): Not on file  . Lack of Transportation (Non-Medical): Not on file  Physical Activity:   . Days of Exercise per Week: Not on file  . Minutes of Exercise per Session: Not on file  Stress:   . Feeling of Stress : Not on file  Social Connections:   . Frequency of Communication with Friends and Family: Not on file  . Frequency of Social Gatherings with Friends and Family: Not on file  . Attends Religious Services: Not on file  . Active Member of Clubs or Organizations: Not on file  . Attends Archivist Meetings: Not on file  . Marital Status: Not on file  Intimate Partner Violence:   . Fear of Current or Ex-Partner: Not  on file  . Emotionally Abused: Not on file  . Physically Abused: Not on file  . Sexually Abused: Not on file    Review of Systems  Constitutional: Negative.  HENT: Negative.   Eyes: Negative.   Respiratory: Negative.   Cardiovascular: Negative.   Gastrointestinal: Negative.   Genitourinary: Negative.   Musculoskeletal: Negative.   Skin: Negative.   Neurological: Negative.   Endo/Heme/Allergies: Negative.   Psychiatric/Behavioral: Negative.     PHYSICAL EXAMINATION:    BP 110/70 (BP Location: Right Arm, Patient Position: Sitting, Cuff Size: Normal)   Pulse (!) 104   Resp 14   Ht 5' (1.524 m)   Wt 144 lb (65.3 kg)   LMP  (LMP Unknown)   BMI 28.12 kg/m     General appearance: alert, cooperative and appears stated age  Pelvic: External genitalia:  no lesions              Urethra:  normal appearing urethra with no masses, tenderness or lesions              Bartholins and Skenes: normal                 Vagina: normal appearing vagina with normal color and discharge, no lesions. The pessary was removed and cleaned. No vaginal irritation              Cervix:absent  Karmen Bongo, RN was present for exam.  ASSESSMENT Pessary check, doing well with the #4 cube pessary    PLAN Continue 2 x a week vaginal estrogen F/U in one month

## 2019-12-04 ENCOUNTER — Ambulatory Visit: Payer: Self-pay | Admitting: Obstetrics and Gynecology

## 2019-12-08 ENCOUNTER — Other Ambulatory Visit: Payer: Self-pay | Admitting: Family Medicine

## 2019-12-31 ENCOUNTER — Other Ambulatory Visit: Payer: Self-pay | Admitting: Family Medicine

## 2019-12-31 DIAGNOSIS — R42 Dizziness and giddiness: Secondary | ICD-10-CM

## 2020-01-01 ENCOUNTER — Telehealth: Payer: Self-pay

## 2020-01-01 ENCOUNTER — Encounter: Payer: Self-pay | Admitting: Obstetrics and Gynecology

## 2020-01-01 ENCOUNTER — Ambulatory Visit: Payer: Medicare Other | Admitting: Obstetrics and Gynecology

## 2020-01-01 ENCOUNTER — Other Ambulatory Visit: Payer: Self-pay

## 2020-01-01 VITALS — BP 130/72 | HR 72 | Ht 60.0 in | Wt 146.2 lb

## 2020-01-01 DIAGNOSIS — T8389XA Other specified complication of genitourinary prosthetic devices, implants and grafts, initial encounter: Secondary | ICD-10-CM

## 2020-01-01 DIAGNOSIS — N8111 Cystocele, midline: Secondary | ICD-10-CM | POA: Diagnosis not present

## 2020-01-01 DIAGNOSIS — N952 Postmenopausal atrophic vaginitis: Secondary | ICD-10-CM

## 2020-01-01 DIAGNOSIS — N898 Other specified noninflammatory disorders of vagina: Secondary | ICD-10-CM | POA: Diagnosis not present

## 2020-01-01 NOTE — Telephone Encounter (Signed)
Patient need a two week follow up with Dr. Talbert Nan. Sending to triage to assist with scheduling.

## 2020-01-01 NOTE — Patient Instructions (Signed)

## 2020-01-01 NOTE — Telephone Encounter (Signed)
Call returned to patient, left message advising 2 wk f/u scheduled with Dr. Talbert Nan on 01/14/20 at 4pm, arriving at 3:45pm. Return call to office if you need to make any changes to this appt.   Encounter closed.

## 2020-01-01 NOTE — Progress Notes (Signed)
GYNECOLOGY  VISIT   HPI: 74 y.o.   Divorced White or Caucasian Not Hispanic or Latino  female   [phone] with No LMP recorded (lmp unknown). Patient has had a hysterectomy.   here for a 4 week pessary follow up. She says that she is doing well.  She strained the other day with constipation and hinks the pessary may have shifted a little after that. Feels fine now. No vaginal bleeding. Slight vaginal d/c.  She was fitted with a #4 cube pessary last month, her prior ring pessary was slipping and causing vaginal irritation.  She is using vaginal estrogen 1-2 x a week.  Occasional urge incontinence. No GSI.  She had a long term cough for 4-5 months, seem better in the last few weeks. She is being set up to see a Pulmonary specialist.  She does occasionally have issues with constipation, not a regular issue.   GYNECOLOGIC HISTORY: No LMP recorded (lmp unknown). Patient has had a hysterectomy. Contraception:PMP  Menopausal hormone therapy: none         OB History    Gravida  2   Para  2   Term  2   Preterm      AB      Living  1     SAB      TAB      Ectopic      Multiple      Live Births  2              Patient Active Problem List   Diagnosis Date Noted  . Aortic atherosclerosis ([place]) 06/22/2019  . Type 2 diabetes mellitus with hyperglycemia, without long-term current use of insulin ([place]) 06/18/2016  . OA (osteoarthritis) of knee 11/03/2015  . Hyperlipidemia associated with type 2 diabetes mellitus ([place]) 07/23/2014  . Genu valgum 08/11/2012  . Hypertension associated with diabetes ([place]) 02/25/2010    Past Medical History:  Diagnosis Date  . Anemia   . Cataract   . Diabetes mellitus without complication ([place])   . Gastric ulcer 11/17/2011  . History of total left hip replacement 2011  . HTN (hypertension)   . Hx of total knee replacement 2017   11-03-2015   . Hyperlipemia   . Hyperplastic polyp of intestine 11/17/2011  . Hypertension   . Measles   .  Menopause   . Migraine   . Osteoarthritis     Past Surgical History:  Procedure Laterality Date  . CARPAL TUNNEL RELEASE Right   . CATARACT EXTRACTION Bilateral   . HIP ARTHROPLASTY Left 2011  . JOINT REPLACEMENT    . RETINAL DETACHMENT SURGERY    . TOTAL KNEE ARTHROPLASTY Right 11/03/2015   Procedure: RIGHT TOTAL KNEE ARTHROPLASTY;  Surgeon: Gaynelle Arabian, MD;  Location: [facility];  Service: Orthopedics;  Laterality: Right;  . TOTAL VAGINAL HYSTERECTOMY  2012   TVH, anterior repair, TVT (no mesh used for the anterior repair)    Current Outpatient Medications  Medication Sig Dispense Refill  . Accu-Chek FastClix Lancets MISC Check blood sugars 1-2 times per day. 100 each 2  . albuterol (VENTOLIN HFA) 108 (90 Base) MCG/ACT inhaler TAKE 2 PUFFS BY MOUTH EVERY 6 HOURS AS NEEDED FOR WHEEZE OR SHORTNESS OF BREATH 6.7 each 1  . aspirin EC 81 MG tablet Take 81 mg by mouth daily.    . Cyanocobalamin (B-12 PO) Take 1 tablet by mouth daily.    . fluticasone (FLONASE) 50 MCG/ACT nasal spray PLACE 1 SPRAY INTO  BOTH NOSTRILS 2 (TWO) TIMES DAILY AS NEEDED FOR ALLERGIES OR RHINITIS. 48 mL 0  . glucose blood (ACCU-CHEK GUIDE) test strip TEST FOUR TIMES DAILY. 100 each 6  . ibuprofen (ADVIL) 200 MG tablet Take 400 mg by mouth every 6 (six) hours as needed for headache or mild pain.    . meclizine (ANTIVERT) 25 MG tablet TAKE 1 TABLET (25 MG TOTAL) BY MOUTH 3 (THREE) TIMES DAILY AS NEEDED FOR DIZZINESS. 60 tablet 1  . metFORMIN (GLUCOPHAGE) 500 MG tablet Take 1 tablet (500 mg total) by mouth 2 (two) times daily with a meal. 180 tablet 3  . Multiple Vitamin (MULTIVITAMIN WITH MINERALS) TABS tablet Take 1 tablet by mouth daily.    . NONFORMULARY OR COMPOUNDED ITEM Estradiol cream 0.02%, use 1/2 a gram vaginally 2 x a week at hs. 3 month supply sent, 3 refills 3 each 1  . pravastatin (PRAVACHOL) 40 MG tablet TAKE 1 TABLET BY MOUTH EVERY DAY 90 tablet 0  . traZODone (DESYREL) 50 MG tablet TAKE 1/2 TO 1 TABLET  BY MOUTH AT BEDTIME AS NEEDED FOR SLEEP 90 tablet 2  . triamcinolone cream (KENALOG) 0.1 % Apply 1 application topically 4 (four) times daily as needed (bug bites).     . triamterene-hydrochlorothiazide (DYAZIDE) 37.5-25 MG capsule Take 1 capsule po qd (Patient taking differently: Take 1 capsule by mouth daily. ) 90 capsule 3   No current facility-administered medications for this visit.     ALLERGIES: Probiotic [acidophilus]  Family History  Problem Relation Age of Onset  . Diabetes Mother        deceased  . Heart attack Mother   . Diabetes Sister        x2  . Kidney cancer Father   . Heart attack Father   . Diabetes Sister     Social History   Socioeconomic History  . Marital status: Divorced    Spouse name: Not on file  . Number of children: Not on file  . Years of education: Not on file  . Highest education level: Not on file  Occupational History  . Occupation: retired    Fish farm manager: RETIRED  Tobacco Use  . Smoking status: Never Smoker  . Smokeless tobacco: Never Used  Vaping Use  . Vaping Use: Never used  Substance and Sexual Activity  . Alcohol use: No  . Drug use: No  . Sexual activity: Not Currently    Partners: Male    Birth control/protection: Post-menopausal, Surgical  Other Topics Concern  . Not on file  Social History Narrative   Divorced, 2 children; retired.    Custody of grandchildren    Social Determinants of Health   Financial Resource Strain:   . Difficulty of Paying Living Expenses: Not on file  Food Insecurity:   . Worried About Charity fundraiser in the Last Year: Not on file  . Ran Out of Food in the Last Year: Not on file  Transportation Needs:   . Lack of Transportation (Medical): Not on file  . Lack of Transportation (Non-Medical): Not on file  Physical Activity:   . Days of Exercise per Week: Not on file  . Minutes of Exercise per Session: Not on file  Stress:   . Feeling of Stress : Not on file  Social Connections:   .  Frequency of Communication with Friends and Family: Not on file  . Frequency of Social Gatherings with Friends and Family: Not on file  . Attends Religious Services:  Not on file  . Active Member of Clubs or Organizations: Not on file  . Attends Archivist Meetings: Not on file  . Marital Status: Not on file  Intimate Partner Violence:   . Fear of Current or Ex-Partner: Not on file  . Emotionally Abused: Not on file  . Physically Abused: Not on file  . Sexually Abused: Not on file    Review of Systems  All other systems reviewed and are negative.   PHYSICAL EXAMINATION:    BP 130/72   Pulse 72   Ht 5' (1.524 m)   Wt 146 lb 3.2 oz (66.3 kg)   LMP  (LMP Unknown)   SpO2 99%   BMI 28.55 kg/m     General appearance: alert, cooperative and appears stated age   Pelvic: External genitalia:  no lesions              Urethra:  normal appearing urethra with no masses, tenderness or lesions              Bartholins and Skenes: normal                 Vagina: the pessary was removed and cleaned, there was a small amount of blood on the pessary. She does have a 2 x <1 cm ulceration at the upper anterior vaginal wall. Mild vaginal atrophy  She has a grade 3 cystocele, no significant rectocele or vaginal apex prolapse              Cervix: absent  Chaperone was present for exam.  ASSESSMENT Vaginal ulceration from the pessary Cystocele, recurrent, no significant vaginal prolapse or rectocele.  Vaginal atrophy, mild    PLAN Leave the pessary out She will use the estrogen cream 3 x a week for the next 2 weeks, then f/u We discussed repeat cystocele repair vs colpocleisis if possible (sometimes prolapse is more pronounced in the OR). We discussed the risks of surgery, including but not limited to: bleeding, infection, damage to bowel, bladder or vessels. Discussed the risk of recurrent prolapse and that the risk is higher with chronic cough or constipation  When she returns in 2  weeks, we will further discuss. Mesh is an option with repeat repair, it may decrease the risk of recurrent prolapse, but increases the risk of complications. If she desires mesh I will refer her to Urogynecology.   CC: Dr Rogers Blocker    Over 20 minutes spent in total patient care.

## 2020-01-13 ENCOUNTER — Other Ambulatory Visit: Payer: Self-pay | Admitting: Family Medicine

## 2020-01-13 DIAGNOSIS — J069 Acute upper respiratory infection, unspecified: Secondary | ICD-10-CM

## 2020-01-14 ENCOUNTER — Ambulatory Visit: Payer: Medicare Other | Admitting: Obstetrics and Gynecology

## 2020-01-14 ENCOUNTER — Other Ambulatory Visit: Payer: Self-pay

## 2020-01-14 ENCOUNTER — Encounter: Payer: Self-pay | Admitting: Obstetrics and Gynecology

## 2020-01-14 VITALS — BP 122/64 | HR 68 | Ht 60.0 in | Wt 143.0 lb

## 2020-01-14 DIAGNOSIS — N898 Other specified noninflammatory disorders of vagina: Secondary | ICD-10-CM

## 2020-01-14 DIAGNOSIS — T8389XA Other specified complication of genitourinary prosthetic devices, implants and grafts, initial encounter: Secondary | ICD-10-CM | POA: Diagnosis not present

## 2020-01-14 DIAGNOSIS — N8111 Cystocele, midline: Secondary | ICD-10-CM | POA: Diagnosis not present

## 2020-01-14 DIAGNOSIS — N952 Postmenopausal atrophic vaginitis: Secondary | ICD-10-CM

## 2020-01-14 NOTE — Progress Notes (Signed)
GYNECOLOGY  VISIT   HPI: 74 y.o.   Divorced White or Caucasian Not Hispanic or Latino  female   [phone] with No LMP recorded (lmp unknown). Patient has had a hysterectomy.   here for follow up for Vaginal ulceration from her pessary.  Her pessary was left out at her last visit. She is uncomfortable with the pessary out. Harder time voiding.  GYNECOLOGIC HISTORY: No LMP recorded (lmp unknown). Patient has had a hysterectomy. Contraception:none  Menopausal hormone therapy: estradiol cream         OB History    Gravida  2   Para  2   Term  2   Preterm      AB      Living  1     SAB      TAB      Ectopic      Multiple      Live Births  2              Patient Active Problem List   Diagnosis Date Noted  . Aortic atherosclerosis ([place]) 06/22/2019  . Type 2 diabetes mellitus with hyperglycemia, without long-term current use of insulin ([place]) 06/18/2016  . OA (osteoarthritis) of knee 11/03/2015  . Hyperlipidemia associated with type 2 diabetes mellitus ([place]) 07/23/2014  . Genu valgum 08/11/2012  . Hypertension associated with diabetes ([place]) 02/25/2010    Past Medical History:  Diagnosis Date  . Anemia   . Cataract   . Diabetes mellitus without complication ([place])   . Gastric ulcer 11/17/2011  . History of total left hip replacement 2011  . HTN (hypertension)   . Hx of total knee replacement 2017   11-03-2015   . Hyperlipemia   . Hyperplastic polyp of intestine 11/17/2011  . Hypertension   . Measles   . Menopause   . Migraine   . Osteoarthritis     Past Surgical History:  Procedure Laterality Date  . CARPAL TUNNEL RELEASE Right   . CATARACT EXTRACTION Bilateral   . HIP ARTHROPLASTY Left 2011  . JOINT REPLACEMENT    . RETINAL DETACHMENT SURGERY    . TOTAL KNEE ARTHROPLASTY Right 11/03/2015   Procedure: RIGHT TOTAL KNEE ARTHROPLASTY;  Surgeon: Gaynelle Arabian, MD;  Location: [facility];  Service: Orthopedics;  Laterality: Right;  . TOTAL VAGINAL HYSTERECTOMY   2012   TVH, anterior repair, TVT (no mesh used for the anterior repair)    Current Outpatient Medications  Medication Sig Dispense Refill  . Accu-Chek FastClix Lancets MISC Check blood sugars 1-2 times per day. 100 each 2  . albuterol (VENTOLIN HFA) 108 (90 Base) MCG/ACT inhaler TAKE 2 PUFFS BY MOUTH EVERY 6 HOURS AS NEEDED FOR WHEEZE OR SHORTNESS OF BREATH 6.7 each 1  . aspirin EC 81 MG tablet Take 81 mg by mouth daily.    . Cyanocobalamin (B-12 PO) Take 1 tablet by mouth daily.    . fluticasone (FLONASE) 50 MCG/ACT nasal spray PLACE 1 SPRAY INTO BOTH NOSTRILS 2 (TWO) TIMES DAILY AS NEEDED FOR ALLERGIES OR RHINITIS. 48 mL 0  . glucose blood (ACCU-CHEK GUIDE) test strip TEST FOUR TIMES DAILY. 100 each 6  . ibuprofen (ADVIL) 200 MG tablet Take 400 mg by mouth every 6 (six) hours as needed for headache or mild pain.    . meclizine (ANTIVERT) 25 MG tablet TAKE 1 TABLET (25 MG TOTAL) BY MOUTH 3 (THREE) TIMES DAILY AS NEEDED FOR DIZZINESS. 60 tablet 1  . metFORMIN (GLUCOPHAGE) 500 MG tablet Take  1 tablet (500 mg total) by mouth 2 (two) times daily with a meal. 180 tablet 3  . Multiple Vitamin (MULTIVITAMIN WITH MINERALS) TABS tablet Take 1 tablet by mouth daily.    . NONFORMULARY OR COMPOUNDED ITEM Estradiol cream 0.02%, use 1/2 a gram vaginally 2 x a week at hs. 3 month supply sent, 3 refills 3 each 1  . phentermine (ADIPEX-P) 37.5 MG tablet Take by mouth.    . pravastatin (PRAVACHOL) 40 MG tablet TAKE 1 TABLET BY MOUTH EVERY DAY 90 tablet 0  . traZODone (DESYREL) 50 MG tablet TAKE 1/2 TO 1 TABLET BY MOUTH AT BEDTIME AS NEEDED FOR SLEEP 90 tablet 2  . triamcinolone cream (KENALOG) 0.1 % Apply 1 application topically 4 (four) times daily as needed (bug bites).     . triamterene-hydrochlorothiazide (DYAZIDE) 37.5-25 MG capsule Take 1 capsule po qd (Patient taking differently: Take 1 capsule by mouth daily. ) 90 capsule 3   No current facility-administered medications for this visit.      ALLERGIES: Probiotic [acidophilus]  Family History  Problem Relation Age of Onset  . Diabetes Mother        deceased  . Heart attack Mother   . Diabetes Sister        x2  . Kidney cancer Father   . Heart attack Father   . Diabetes Sister     Social History   Socioeconomic History  . Marital status: Divorced    Spouse name: Not on file  . Number of children: Not on file  . Years of education: Not on file  . Highest education level: Not on file  Occupational History  . Occupation: retired    Fish farm manager: RETIRED  Tobacco Use  . Smoking status: Never Smoker  . Smokeless tobacco: Never Used  Vaping Use  . Vaping Use: Never used  Substance and Sexual Activity  . Alcohol use: No  . Drug use: No  . Sexual activity: Not Currently    Partners: Male    Birth control/protection: Post-menopausal, Surgical  Other Topics Concern  . Not on file  Social History Narrative   Divorced, 2 children; retired.    Custody of grandchildren    Social Determinants of Health   Financial Resource Strain:   . Difficulty of Paying Living Expenses: Not on file  Food Insecurity:   . Worried About Charity fundraiser in the Last Year: Not on file  . Ran Out of Food in the Last Year: Not on file  Transportation Needs:   . Lack of Transportation (Medical): Not on file  . Lack of Transportation (Non-Medical): Not on file  Physical Activity:   . Days of Exercise per Week: Not on file  . Minutes of Exercise per Session: Not on file  Stress:   . Feeling of Stress : Not on file  Social Connections:   . Frequency of Communication with Friends and Family: Not on file  . Frequency of Social Gatherings with Friends and Family: Not on file  . Attends Religious Services: Not on file  . Active Member of Clubs or Organizations: Not on file  . Attends Archivist Meetings: Not on file  . Marital Status: Not on file  Intimate Partner Violence:   . Fear of Current or Ex-Partner: Not on file   . Emotionally Abused: Not on file  . Physically Abused: Not on file  . Sexually Abused: Not on file    Review of Systems  All other systems  reviewed and are negative.   PHYSICAL EXAMINATION:    BP 122/64   Pulse 68   Ht 5' (1.524 m)   Wt 143 lb (64.9 kg)   LMP  (LMP Unknown)   SpO2 100%   BMI 27.93 kg/m     General appearance: alert, cooperative and appears stated age  Pelvic: External genitalia:  no lesions              Urethra:  normal appearing urethra with no masses, tenderness or lesions              Bartholins and Skenes: normal                 Vagina: mildly atrophic appearing vagina, healing vaginal laceration on the anterior vaginal wall, now under a cm. Grade 3 cystocele, minimal vault relaxation (but comes within 3 cm of the introitus)               Cervix: absent              Bimanual Exam:  Uterus:  uterus absent              Adnexa: no mass, fullness, tenderness                Chaperone was present for exam.  ASSESSMENT Symptomatic cystocele, good control with the cube pessary but having recurrent vaginal irritation, ulceration Vaginal atrophy, using estrace cream 3 x a week at the moment    PLAN Will leave the pessary out Continue to use the vaginal cream 3 x a week F/U in 1.5 weeks Discussed the option of cystocele repair, possible colpocleisis, vs referral to urogyn and possible cystocele repair with mesh Discussed the risk of recurrent prolapse  Over 20 minutes in total patient care

## 2020-01-24 NOTE — Progress Notes (Signed)
GYNECOLOGY  VISIT   HPI: 74 y.o.   Divorced White or Caucasian Not Hispanic or Latino  female   [phone] with No LMP recorded (lmp unknown). Patient has had a hysterectomy.   here for f/u for pessary and cystocele. Her pessary has been out for a couple of weeks secondary to vaginal ulceration.     GYNECOLOGIC HISTORY: No LMP recorded (lmp unknown). Patient has had a hysterectomy. Contraception:Hysterectomy Menopausal hormone therapy: Estradiol cream        OB History    Gravida  2   Para  2   Term  2   Preterm      AB      Living  1     SAB      TAB      Ectopic      Multiple      Live Births  2              Patient Active Problem List   Diagnosis Date Noted  . Aortic atherosclerosis ([place]) 06/22/2019  . Type 2 diabetes mellitus with hyperglycemia, without long-term current use of insulin ([place]) 06/18/2016  . OA (osteoarthritis) of knee 11/03/2015  . Hyperlipidemia associated with type 2 diabetes mellitus ([place]) 07/23/2014  . Genu valgum 08/11/2012  . Hypertension associated with diabetes ([place]) 02/25/2010    Past Medical History:  Diagnosis Date  . Anemia   . Cataract   . Diabetes mellitus without complication ([place])   . Gastric ulcer 11/17/2011  . History of total left hip replacement 2011  . HTN (hypertension)   . Hx of total knee replacement 2017   11-03-2015   . Hyperlipemia   . Hyperplastic polyp of intestine 11/17/2011  . Hypertension   . Measles   . Menopause   . Migraine   . Osteoarthritis     Past Surgical History:  Procedure Laterality Date  . CARPAL TUNNEL RELEASE Right   . CATARACT EXTRACTION Bilateral   . HIP ARTHROPLASTY Left 2011  . JOINT REPLACEMENT    . RETINAL DETACHMENT SURGERY    . TOTAL KNEE ARTHROPLASTY Right 11/03/2015   Procedure: RIGHT TOTAL KNEE ARTHROPLASTY;  Surgeon: Gaynelle Arabian, MD;  Location: [facility];  Service: Orthopedics;  Laterality: Right;  . TOTAL VAGINAL HYSTERECTOMY  2012   TVH, anterior repair, TVT (no  mesh used for the anterior repair)    Current Outpatient Medications  Medication Sig Dispense Refill  . Accu-Chek FastClix Lancets MISC Check blood sugars 1-2 times per day. 100 each 2  . albuterol (VENTOLIN HFA) 108 (90 Base) MCG/ACT inhaler TAKE 2 PUFFS BY MOUTH EVERY 6 HOURS AS NEEDED FOR WHEEZE OR SHORTNESS OF BREATH 6.7 each 1  . aspirin EC 81 MG tablet Take 81 mg by mouth daily.    . Cyanocobalamin (B-12 PO) Take 1 tablet by mouth daily.    . fluticasone (FLONASE) 50 MCG/ACT nasal spray PLACE 1 SPRAY INTO BOTH NOSTRILS 2 (TWO) TIMES DAILY AS NEEDED FOR ALLERGIES OR RHINITIS. 48 mL 0  . glucose blood (ACCU-CHEK GUIDE) test strip TEST FOUR TIMES DAILY. 100 each 6  . ibuprofen (ADVIL) 200 MG tablet Take 400 mg by mouth every 6 (six) hours as needed for headache or mild pain.    . meclizine (ANTIVERT) 25 MG tablet TAKE 1 TABLET (25 MG TOTAL) BY MOUTH 3 (THREE) TIMES DAILY AS NEEDED FOR DIZZINESS. 60 tablet 1  . metFORMIN (GLUCOPHAGE) 500 MG tablet Take 1 tablet (500 mg total) by mouth 2 (  two) times daily with a meal. 180 tablet 3  . Multiple Vitamin (MULTIVITAMIN WITH MINERALS) TABS tablet Take 1 tablet by mouth daily.    . NONFORMULARY OR COMPOUNDED ITEM Estradiol cream 0.02%, use 1/2 a gram vaginally 2 x a week at hs. 3 month supply sent, 3 refills 3 each 1  . phentermine (ADIPEX-P) 37.5 MG tablet Take by mouth.    . pravastatin (PRAVACHOL) 40 MG tablet TAKE 1 TABLET BY MOUTH EVERY DAY 90 tablet 0  . traZODone (DESYREL) 50 MG tablet TAKE 1/2 TO 1 TABLET BY MOUTH AT BEDTIME AS NEEDED FOR SLEEP 90 tablet 2  . triamcinolone cream (KENALOG) 0.1 % Apply 1 application topically 4 (four) times daily as needed (bug bites).     . triamterene-hydrochlorothiazide (DYAZIDE) 37.5-25 MG capsule Take 1 capsule po qd (Patient taking differently: Take 1 capsule by mouth daily. ) 90 capsule 3   No current facility-administered medications for this visit.     ALLERGIES: Probiotic [acidophilus]  Family  History  Problem Relation Age of Onset  . Diabetes Mother        deceased  . Heart attack Mother   . Diabetes Sister        x2  . Kidney cancer Father   . Heart attack Father   . Diabetes Sister     Social History   Socioeconomic History  . Marital status: Divorced    Spouse name: Not on file  . Number of children: Not on file  . Years of education: Not on file  . Highest education level: Not on file  Occupational History  . Occupation: retired    Fish farm manager: RETIRED  Tobacco Use  . Smoking status: Never Smoker  . Smokeless tobacco: Never Used  Vaping Use  . Vaping Use: Never used  Substance and Sexual Activity  . Alcohol use: No  . Drug use: No  . Sexual activity: Not Currently    Partners: Male    Birth control/protection: Post-menopausal, Surgical  Other Topics Concern  . Not on file  Social History Narrative   Divorced, 2 children; retired.    Custody of grandchildren    Social Determinants of Health   Financial Resource Strain:   . Difficulty of Paying Living Expenses: Not on file  Food Insecurity:   . Worried About Charity fundraiser in the Last Year: Not on file  . Ran Out of Food in the Last Year: Not on file  Transportation Needs:   . Lack of Transportation (Medical): Not on file  . Lack of Transportation (Non-Medical): Not on file  Physical Activity:   . Days of Exercise per Week: Not on file  . Minutes of Exercise per Session: Not on file  Stress:   . Feeling of Stress : Not on file  Social Connections:   . Frequency of Communication with Friends and Family: Not on file  . Frequency of Social Gatherings with Friends and Family: Not on file  . Attends Religious Services: Not on file  . Active Member of Clubs or Organizations: Not on file  . Attends Archivist Meetings: Not on file  . Marital Status: Not on file  Intimate Partner Violence:   . Fear of Current or Ex-Partner: Not on file  . Emotionally Abused: Not on file  . Physically  Abused: Not on file  . Sexually Abused: Not on file    Review of Systems  Constitutional: Negative.   HENT: Negative.   Eyes: Negative.  Respiratory: Negative.   Cardiovascular: Negative.   Gastrointestinal: Negative.   Genitourinary: Negative.   Musculoskeletal: Negative.   Skin: Negative.   Neurological: Negative.   Endo/Heme/Allergies: Negative.   Psychiatric/Behavioral: Negative.     PHYSICAL EXAMINATION:    BP (!) 142/80 (BP Location: Right Arm, Patient Position: Sitting, Cuff Size: Normal)   Pulse 78   Resp 16   Wt 143 lb 3.2 oz (65 kg)   LMP  (LMP Unknown)   BMI 27.97 kg/m     General appearance: alert, cooperative and appears stated age  Pelvic: External genitalia:  no lesions              Urethra:  normal appearing urethra with no masses, tenderness or lesions              Bartholins and Skenes: normal                 Vagina: mildly atrophic, grade 3 cystocele, ulceration at the apex is healing, still under a cm, filling in, not friable.               Cervix: absent              Bimanual Exam:  Uterus:  uterus absent              Adnexa: no mass, fullness, tenderness                Chaperone was present for exam.  ASSESSMENT Symptomatic cystocele. She has good control with the cube pessary but is having vaginal irritation Still healing, pessary is left out    PLAN F/U next week, will replace pessary. She will bring her estrogen cream.  Continue with estrogen cream 3 x a week until the pessary is placed. She is considering surgical repair early next year.  Previously discussed the option of cystocele repair, possible colpocleisis, vs referral to urogyn and possible cystocele repair with mesh

## 2020-01-25 ENCOUNTER — Encounter: Payer: Self-pay | Admitting: Obstetrics and Gynecology

## 2020-01-25 ENCOUNTER — Ambulatory Visit: Payer: Self-pay | Admitting: Obstetrics and Gynecology

## 2020-01-25 ENCOUNTER — Ambulatory Visit: Payer: Medicare Other | Admitting: Obstetrics and Gynecology

## 2020-01-25 ENCOUNTER — Telehealth: Payer: Self-pay

## 2020-01-25 ENCOUNTER — Other Ambulatory Visit: Payer: Self-pay

## 2020-01-25 VITALS — BP 142/80 | HR 78 | Resp 16 | Wt 143.2 lb

## 2020-01-25 DIAGNOSIS — N952 Postmenopausal atrophic vaginitis: Secondary | ICD-10-CM | POA: Diagnosis not present

## 2020-01-25 DIAGNOSIS — T8389XA Other specified complication of genitourinary prosthetic devices, implants and grafts, initial encounter: Secondary | ICD-10-CM | POA: Diagnosis not present

## 2020-01-25 DIAGNOSIS — N898 Other specified noninflammatory disorders of vagina: Secondary | ICD-10-CM | POA: Diagnosis not present

## 2020-01-25 DIAGNOSIS — N8111 Cystocele, midline: Secondary | ICD-10-CM

## 2020-01-25 NOTE — Telephone Encounter (Signed)
Call placed to patient. Left message advising f/u appt scheduled for 11/17 at 2:30pm w/ Dr. Talbert Nan, arrive at 2:15pm. Return call to office if you need to make any changes to this appt.   Encounter closed.

## 2020-01-25 NOTE — Telephone Encounter (Signed)
Patient need an appointment for follow up next week. Sending to triage to assist with scheduling.

## 2020-01-29 ENCOUNTER — Institutional Professional Consult (permissible substitution): Payer: Medicare Other | Admitting: Emergency Medicine

## 2020-01-30 ENCOUNTER — Telehealth: Payer: Self-pay

## 2020-01-30 ENCOUNTER — Encounter: Payer: Self-pay | Admitting: Obstetrics and Gynecology

## 2020-01-30 ENCOUNTER — Other Ambulatory Visit: Payer: Self-pay | Admitting: Family Medicine

## 2020-01-30 ENCOUNTER — Other Ambulatory Visit: Payer: Self-pay

## 2020-01-30 ENCOUNTER — Ambulatory Visit: Payer: Medicare Other | Admitting: Obstetrics and Gynecology

## 2020-01-30 VITALS — BP 138/78 | HR 66 | Ht 60.0 in | Wt 141.8 lb

## 2020-01-30 DIAGNOSIS — I10 Essential (primary) hypertension: Secondary | ICD-10-CM

## 2020-01-30 DIAGNOSIS — N8111 Cystocele, midline: Secondary | ICD-10-CM | POA: Diagnosis not present

## 2020-01-30 DIAGNOSIS — Z4689 Encounter for fitting and adjustment of other specified devices: Secondary | ICD-10-CM

## 2020-01-30 DIAGNOSIS — N952 Postmenopausal atrophic vaginitis: Secondary | ICD-10-CM | POA: Diagnosis not present

## 2020-01-30 NOTE — Telephone Encounter (Signed)
Call to patient. Patient scheduled for one month follow up on 03-12-20 at 1630. Patient agreeable to date and time of appointment. Declined earlier appointments due to the holidays, but aware to call if any problems before follow up appointment.   Routing to provider and will close encounter.

## 2020-01-30 NOTE — Telephone Encounter (Signed)
Patient need a 4 week pessary recheck. Sending to triage to assist with scheduling.

## 2020-01-30 NOTE — Progress Notes (Signed)
GYNECOLOGY  VISIT   HPI: 74 y.o.   Divorced White or Caucasian Not Hispanic or Latino  female   [phone] with No LMP recorded (lmp unknown). Patient has had a hysterectomy.   here for placement of her pessary. It was left out secondary to vaginal irritation.   GYNECOLOGIC HISTORY: No LMP recorded (lmp unknown). Patient has had a hysterectomy. Contraception:NA Menopausal hormone therapy: estradiol         OB History    Gravida  2   Para  2   Term  2   Preterm      AB      Living  1     SAB      TAB      Ectopic      Multiple      Live Births  2              Patient Active Problem List   Diagnosis Date Noted  . Aortic atherosclerosis ([place]) 06/22/2019  . Type 2 diabetes mellitus with hyperglycemia, without long-term current use of insulin ([place]) 06/18/2016  . OA (osteoarthritis) of knee 11/03/2015  . Hyperlipidemia associated with type 2 diabetes mellitus ([place]) 07/23/2014  . Genu valgum 08/11/2012  . Hypertension associated with diabetes ([place]) 02/25/2010    Past Medical History:  Diagnosis Date  . Anemia   . Cataract   . Diabetes mellitus without complication ([place])   . Gastric ulcer 11/17/2011  . History of total left hip replacement 2011  . HTN (hypertension)   . Hx of total knee replacement 2017   11-03-2015   . Hyperlipemia   . Hyperplastic polyp of intestine 11/17/2011  . Hypertension   . Measles   . Menopause   . Migraine   . Osteoarthritis     Past Surgical History:  Procedure Laterality Date  . CARPAL TUNNEL RELEASE Right   . CATARACT EXTRACTION Bilateral   . HIP ARTHROPLASTY Left 2011  . JOINT REPLACEMENT    . RETINAL DETACHMENT SURGERY    . TOTAL KNEE ARTHROPLASTY Right 11/03/2015   Procedure: RIGHT TOTAL KNEE ARTHROPLASTY;  Surgeon: Gaynelle Arabian, MD;  Location: [facility];  Service: Orthopedics;  Laterality: Right;  . TOTAL VAGINAL HYSTERECTOMY  2012   TVH, anterior repair, TVT (no mesh used for the anterior repair)    Current  Outpatient Medications  Medication Sig Dispense Refill  . Accu-Chek FastClix Lancets MISC Check blood sugars 1-2 times per day. 100 each 2  . albuterol (VENTOLIN HFA) 108 (90 Base) MCG/ACT inhaler TAKE 2 PUFFS BY MOUTH EVERY 6 HOURS AS NEEDED FOR WHEEZE OR SHORTNESS OF BREATH 6.7 each 1  . aspirin EC 81 MG tablet Take 81 mg by mouth daily.    . Cyanocobalamin (B-12 PO) Take 1 tablet by mouth daily.    . fluticasone (FLONASE) 50 MCG/ACT nasal spray PLACE 1 SPRAY INTO BOTH NOSTRILS 2 (TWO) TIMES DAILY AS NEEDED FOR ALLERGIES OR RHINITIS. 48 mL 0  . glucose blood (ACCU-CHEK GUIDE) test strip TEST FOUR TIMES DAILY. 100 each 6  . ibuprofen (ADVIL) 200 MG tablet Take 400 mg by mouth every 6 (six) hours as needed for headache or mild pain.    . meclizine (ANTIVERT) 25 MG tablet TAKE 1 TABLET (25 MG TOTAL) BY MOUTH 3 (THREE) TIMES DAILY AS NEEDED FOR DIZZINESS. 60 tablet 1  . metFORMIN (GLUCOPHAGE) 500 MG tablet Take 1 tablet (500 mg total) by mouth 2 (two) times daily with a meal. 180 tablet 3  .  Multiple Vitamin (MULTIVITAMIN WITH MINERALS) TABS tablet Take 1 tablet by mouth daily.    . NONFORMULARY OR COMPOUNDED ITEM Estradiol cream 0.02%, use 1/2 a gram vaginally 2 x a week at hs. 3 month supply sent, 3 refills 3 each 1  . phentermine (ADIPEX-P) 37.5 MG tablet Take by mouth.    . pravastatin (PRAVACHOL) 40 MG tablet TAKE 1 TABLET BY MOUTH EVERY DAY 90 tablet 0  . traZODone (DESYREL) 50 MG tablet TAKE 1/2 TO 1 TABLET BY MOUTH AT BEDTIME AS NEEDED FOR SLEEP 90 tablet 2  . triamcinolone cream (KENALOG) 0.1 % Apply 1 application topically 4 (four) times daily as needed (bug bites).     . triamterene-hydrochlorothiazide (DYAZIDE) 37.5-25 MG capsule Take 1 each (1 capsule total) by mouth daily. 90 capsule 3   No current facility-administered medications for this visit.     ALLERGIES: Probiotic [acidophilus]  Family History  Problem Relation Age of Onset  . Diabetes Mother        deceased  . Heart  attack Mother   . Diabetes Sister        x2  . Kidney cancer Father   . Heart attack Father   . Diabetes Sister     Social History   Socioeconomic History  . Marital status: Divorced    Spouse name: Not on file  . Number of children: Not on file  . Years of education: Not on file  . Highest education level: Not on file  Occupational History  . Occupation: retired    Fish farm manager: RETIRED  Tobacco Use  . Smoking status: Never Smoker  . Smokeless tobacco: Never Used  Vaping Use  . Vaping Use: Never used  Substance and Sexual Activity  . Alcohol use: No  . Drug use: No  . Sexual activity: Not Currently    Partners: Male    Birth control/protection: Post-menopausal, Surgical  Other Topics Concern  . Not on file  Social History Narrative   Divorced, 2 children; retired.    Custody of grandchildren    Social Determinants of Health   Financial Resource Strain:   . Difficulty of Paying Living Expenses: Not on file  Food Insecurity:   . Worried About Charity fundraiser in the Last Year: Not on file  . Ran Out of Food in the Last Year: Not on file  Transportation Needs:   . Lack of Transportation (Medical): Not on file  . Lack of Transportation (Non-Medical): Not on file  Physical Activity:   . Days of Exercise per Week: Not on file  . Minutes of Exercise per Session: Not on file  Stress:   . Feeling of Stress : Not on file  Social Connections:   . Frequency of Communication with Friends and Family: Not on file  . Frequency of Social Gatherings with Friends and Family: Not on file  . Attends Religious Services: Not on file  . Active Member of Clubs or Organizations: Not on file  . Attends Archivist Meetings: Not on file  . Marital Status: Not on file  Intimate Partner Violence:   . Fear of Current or Ex-Partner: Not on file  . Emotionally Abused: Not on file  . Physically Abused: Not on file  . Sexually Abused: Not on file    Review of Systems  All  other systems reviewed and are negative.   PHYSICAL EXAMINATION:    BP 138/78   Pulse 66   Ht 5' (1.524 m)  Wt 141 lb 12.8 oz (64.3 kg)   LMP  (LMP Unknown)   SpO2 98%   BMI 27.69 kg/m     General appearance: alert, cooperative and appears stated age  Pelvic: External genitalia:  no lesions              Urethra:  normal appearing urethra with no masses, tenderness or lesions              Bartholins and Skenes: normal                 Vagina: mildly atrophic appearing vagina. Vaginal erosion is mostly healed. Estrace cream placed on the pessary and the pessary was replaced.                Chaperone was present for exam.  ASSESSMENT Midline cystocele, controlled with the pessary but recurring issues with vaginal irritation    PLAN Estrogen placed on the pessary and pessary inserted F/u in one month Call with any concerns.

## 2020-02-02 DIAGNOSIS — S60221A Contusion of right hand, initial encounter: Secondary | ICD-10-CM | POA: Diagnosis not present

## 2020-02-08 ENCOUNTER — Other Ambulatory Visit: Payer: Self-pay

## 2020-02-08 ENCOUNTER — Encounter: Payer: Self-pay | Admitting: Pulmonary Disease

## 2020-02-08 ENCOUNTER — Ambulatory Visit: Payer: Medicare Other | Admitting: Pulmonary Disease

## 2020-02-08 VITALS — BP 124/82 | HR 83 | Temp 98.6°F | Ht 60.0 in | Wt 142.8 lb

## 2020-02-08 DIAGNOSIS — R059 Cough, unspecified: Secondary | ICD-10-CM

## 2020-02-08 NOTE — Progress Notes (Signed)
Patient ID: Misty Cervantes, female    DOB: 04/28/1945, 74 y.o.   MRN: [id number]  Chief Complaint  Patient presents with  . Consult    dry cough 5-6 months resolved now    Referring provider: Orma Flaming, MD  HPI:   74 year old woman was seen in consultation at the request of Orma Flaming, MD for evaluation of chronic cough.  Note from referring provider reviewed.  Cough started back in the spring.  Present for several months.  Fortunately, this is essentially totally resolved over the last few weeks.  Cough is largely dry.  No clear timing during the day that was better or worse.  Not associate with eating or drinking.  Not associate with lying supine.  Denies any shortness of breath, dyspnea on exertion.  Not limited by dyspnea and any exertional capacity.  No weight loss, fever, chills.  Did use albuterol inhaler as needed and did seem to calm down the cough at times.  No other alleviating or exacerbating factors.  Does note recurrent coughing episodes usually in the winter.  Usually does not last several months but up to days to weeks.  This seems to self resolved.  She denies significant nasal congestion at this time.  Denies significant sinonasal symptoms with seasonal changes, no allergies per her report.  No history of childhood asthma or atopic symptoms.  Chest x-ray 11/27/2019 reviewed and interpreted as clear.  Reviewed CT chest 06/2019 with clear parenchyma, mild right greater than left basilar bronchiectasis noted on my interpretation.  No cardiac work-up to date that I can see.  PMH: Diabetes, osteoarthritis Surgical history: Hip replacement, knee replacement Family history: Kidney cancer in father, no respiratory issues Social history: Never smoker, does not drink alcohol.  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  MMRC: No flowsheet data found.  [place]:    No flowsheet data found.  Tests:   FENO:  No results found for: NITRICOXIDE  PFT: No flowsheet data found.  WALK:  No flowsheet data found.  Imaging: No results found.  Lab Results: Personally reviewed, no eos elevated CBC    Component Value Date/Time   WBC 4.0 07/12/2019 1350   RBC 4.14 07/12/2019 1350   HGB 13.2 07/12/2019 1350   HCT 39.2 07/12/2019 1350   PLT 284.0 07/12/2019 1350   MCV 94.5 07/12/2019 1350   MCH 31.0 06/20/2019 1034   MCHC 33.8 07/12/2019 1350   RDW 13.2 07/12/2019 1350   LYMPHSABS 1.0 07/12/2019 1350   MONOABS 0.3 07/12/2019 1350   EOSABS 0.1 07/12/2019 1350   BASOSABS 0.0 07/12/2019 1350    BMET    Component Value Date/Time   NA 137 07/12/2019 1350   NA 137 08/25/2015 0955   K 4.0 07/12/2019 1350   CL 99 07/12/2019 1350   CO2 33 (H) 07/12/2019 1350   GLUCOSE 175 (H) 07/12/2019 1350   BUN 23 07/12/2019 1350   BUN 23 08/25/2015 0955   CREATININE 1.02 07/12/2019 1350   CREATININE 1.00 08/11/2012 1228   CALCIUM 9.2 07/12/2019 1350   GFRNONAA 49 (L) 06/20/2019 1034   GFRNONAA 58 (L) 08/11/2012 1228   GFRAA 57 (L) 06/20/2019 1034   GFRAA 67 08/11/2012 1228    BNP No results found for: BNP  ProBNP No results found for: PROBNP  Specialty Problems    None      Allergies  Allergen Reactions  .  Probiotic [Acidophilus]     Immunization History  Administered Date(s) Administered  . Fluad Quad(high Dose 65+) 11/19/2018  . Influenza, High Dose Seasonal PF 04/11/2018  . Influenza,inj,Quad PF,6+ Mos 01/28/2015  . PFIZER SARS-COV-2 Vaccination 08/11/2019, 09/02/2019  . Pneumococcal Polysaccharide-23 01/01/2011, 04/11/2018  . Zoster Recombinat (Shingrix) 03/26/2018, 11/19/2018    Past Medical History:  Diagnosis Date  . Anemia   . Cataract   . Diabetes mellitus without complication ([place])   . Gastric ulcer 11/17/2011  . History of total left hip replacement 2011  . HTN (hypertension)   . Hx of total knee replacement 2017    11-03-2015   . Hyperlipemia   . Hyperplastic polyp of intestine 11/17/2011  . Hypertension   . Measles   . Menopause   . Migraine   . Osteoarthritis     Tobacco History: Social History   Tobacco Use  Smoking Status Never Smoker  Smokeless Tobacco Never Used   Counseling given: Not Answered   Continue to not smoke  Outpatient Encounter Medications as of 02/08/2020  Medication Sig  . Accu-Chek FastClix Lancets MISC Check blood sugars 1-2 times per day.  . albuterol (VENTOLIN HFA) 108 (90 Base) MCG/ACT inhaler TAKE 2 PUFFS BY MOUTH EVERY 6 HOURS AS NEEDED FOR WHEEZE OR SHORTNESS OF BREATH  . aspirin EC 81 MG tablet Take 81 mg by mouth daily.  . Cyanocobalamin (B-12 PO) Take 1 tablet by mouth daily.  . fluticasone (FLONASE) 50 MCG/ACT nasal spray PLACE 1 SPRAY INTO BOTH NOSTRILS 2 (TWO) TIMES DAILY AS NEEDED FOR ALLERGIES OR RHINITIS.  Marland Kitchen glucose blood (ACCU-CHEK GUIDE) test strip TEST FOUR TIMES DAILY.  Marland Kitchen ibuprofen (ADVIL) 200 MG tablet Take 400 mg by mouth every 6 (six) hours as needed for headache or mild pain.  . meclizine (ANTIVERT) 25 MG tablet TAKE 1 TABLET (25 MG TOTAL) BY MOUTH 3 (THREE) TIMES DAILY AS NEEDED FOR DIZZINESS.  . metFORMIN (GLUCOPHAGE) 500 MG tablet Take 1 tablet (500 mg total) by mouth 2 (two) times daily with a meal.  . Multiple Vitamin (MULTIVITAMIN WITH MINERALS) TABS tablet Take 1 tablet by mouth daily.  . NONFORMULARY OR COMPOUNDED ITEM Estradiol cream 0.02%, use 1/2 a gram vaginally 2 x a week at hs. 3 month supply sent, 3 refills  . phentermine (ADIPEX-P) 37.5 MG tablet Take by mouth.  . pravastatin (PRAVACHOL) 40 MG tablet TAKE 1 TABLET BY MOUTH EVERY DAY  . traZODone (DESYREL) 50 MG tablet TAKE 1/2 TO 1 TABLET BY MOUTH AT BEDTIME AS NEEDED FOR SLEEP  . triamcinolone cream (KENALOG) 0.1 % Apply 1 application topically 4 (four) times daily as needed (bug bites).   . triamterene-hydrochlorothiazide (DYAZIDE) 37.5-25 MG capsule Take 1 each (1 capsule  total) by mouth daily.   No facility-administered encounter medications on file as of 02/08/2020.     Review of Systems  Review of Systems  No chest pain exertion.  No orthopnea or PND.  Comprehensive review systems otherwise negative.  Physical Exam  BP 124/82 (BP Location: Right Arm, Cuff Size: Normal)   Pulse 83   Temp 98.6 F (37 C)   Ht 5' (1.524 m)   Wt 142 lb 12.8 oz (64.8 kg)   LMP  (LMP Unknown)   SpO2 99%   BMI 27.89 kg/m   Wt Readings from Last 5 Encounters:  02/08/20 142 lb 12.8 oz (64.8 kg)  01/30/20 141 lb 12.8 oz (64.3 kg)  01/25/20 143 lb 3.2 oz (65 kg)  01/14/20 143  lb (64.9 kg)  01/01/20 146 lb 3.2 oz (66.3 kg)    BMI Readings from Last 5 Encounters:  02/08/20 27.89 kg/m  01/30/20 27.69 kg/m  01/25/20 27.97 kg/m  01/14/20 27.93 kg/m  01/01/20 28.55 kg/m     Physical Exam General: Well-appearing, no acute distress Eyes: EOMI, no icterus Neck: Supple, no JVD appreciated. Respiratory: Clear aspiration bilaterally, no wheeze Cardiovascular: Regular rhythm, no murmurs Abdomen: Nondistended, bowel sounds present MSK: No joint effusion, no synovitis Neuro: Normal gait, no weakness Psych: Normal mood, full affect   Assessment & Plan:   Cough: Now resolved.  Present for several months.  Suspect 2 etiologies could be contributing.  Based on prior CT chest 06/2019 with basilar right greater than left mild bronchiectasis, suspect reflux, silent aspiration.  In addition, she reports cyclical bronchitis almost every year.  Most recent coughing lasted longer but seem to respond well to as needed albuterol.  This raises suspicion for reactive airways disease/asthma. --In the future recommend trial of ICS/LABA for several weeks and if cough improves to continue this therapy.  If cough does not improve on ICS/LABA, recommend several week trial of PPI.   Return if symptoms worsen or fail to improve.   Lanier Clam, MD 02/08/2020

## 2020-02-08 NOTE — Patient Instructions (Signed)
Nice to meet you!  The cough was probably related to either something like asthma since you have recurrent symptoms in the winter and seemed better with albuterol or reflux and silent aspiration given evidence of dilated air tubes or bronchiectasis.  If recurs, would plan a future trial of heart burn medicine or inhaler to see if it gets better.  Return to clinic as needed

## 2020-02-13 ENCOUNTER — Other Ambulatory Visit: Payer: Self-pay

## 2020-02-13 ENCOUNTER — Ambulatory Visit (INDEPENDENT_AMBULATORY_CARE_PROVIDER_SITE_OTHER): Payer: Medicare Other | Admitting: Family Medicine

## 2020-02-13 ENCOUNTER — Encounter: Payer: Self-pay | Admitting: Family Medicine

## 2020-02-13 VITALS — BP 100/62 | HR 77 | Temp 97.6°F | Ht 60.0 in | Wt 140.6 lb

## 2020-02-13 DIAGNOSIS — I152 Hypertension secondary to endocrine disorders: Secondary | ICD-10-CM | POA: Diagnosis not present

## 2020-02-13 DIAGNOSIS — S60222A Contusion of left hand, initial encounter: Secondary | ICD-10-CM

## 2020-02-13 DIAGNOSIS — E1165 Type 2 diabetes mellitus with hyperglycemia: Secondary | ICD-10-CM

## 2020-02-13 DIAGNOSIS — E1159 Type 2 diabetes mellitus with other circulatory complications: Secondary | ICD-10-CM

## 2020-02-13 DIAGNOSIS — S63261A Dislocation of metacarpophalangeal joint of left index finger, initial encounter: Secondary | ICD-10-CM | POA: Diagnosis not present

## 2020-02-13 NOTE — Progress Notes (Signed)
Patient: Misty Cervantes MRN: [id number] DOB: [birth year] PCP: Orma Flaming, MD     Subjective:  Chief Complaint  Patient presents with  . Diabetes  . Hypertension  . left hand injury    HPI: The patient is a 74 y.o. female who presents today for DM, HTN, and labs. Pt complains of hand pain due to a recent injury. She was seen in Urgent care, and had xrays. Hand is still swollen.   Diabetes type 2 Diabetes: Patient is here for follow up of type 2 diabetes. First diagnosed4/2018.Currently on the following medications-metformin 500mg  BID. Last A1C was8.0 in September of 2021.She was controlling with diet alone and was going to work on exercise. She continues to not do good with exercise, but has improved her diet.Sugars range from 100 to 120. Much improved with medication. Denies any hypoglycemic events. Denies any vision changes, nausea, vomiting, abdominal pain, ulcers/paraesthesia in feet, polyuria, polydipsia or polyphagia. Denies any chest pain, shortness of breath.she was on metformin in the past. Needs foot exam and eye exam.   Hypertension: Here for follow up of hypertension.  Currently on dyazide 37.5-25mg /day.  Takes medication as prescribed and denies any side effects. Exercise includes limited. Silver sneakers will start again. Weight has been stable. Denies any chest pain, headaches, shortness of breath, vision changes, swelling in lower extremities.   Left hand injury She was coming down her stairs and hit her hand on a knob. Pain on dorsum of hand.  This happened 4 weeks ago. She went to an urgent care. Had an xray done which showed no fracture. Did show dislocation of her 2nd MCP of her left hand and she states she had pain over this area. Can still open and close hand. Urgent care did not refer her to ortho. She is right handed.   Needs flu shot and will get this at her pharmacy.   Review of Systems  Constitutional: Negative for chills, fatigue and fever.  HENT:  Negative for dental problem, ear pain, hearing loss and trouble swallowing.   Eyes: Negative for visual disturbance.  Respiratory: Negative for cough, chest tightness, shortness of breath and wheezing.   Cardiovascular: Negative for chest pain, palpitations and leg swelling.  Gastrointestinal: Negative for abdominal pain, blood in stool, diarrhea and nausea.  Endocrine: Negative for cold intolerance, polydipsia, polyphagia and polyuria.  Genitourinary: Negative for dysuria and hematuria.  Musculoskeletal: Positive for joint swelling. Negative for arthralgias.  Skin: Negative for rash.  Neurological: Negative for dizziness and headaches.  Psychiatric/Behavioral: Negative for dysphoric mood and sleep disturbance. The patient is not nervous/anxious.     Allergies Patient is allergic to probiotic [acidophilus].  Past Medical History Patient  has a past medical history of Anemia, Cataract, Diabetes mellitus without complication ([place]), Gastric ulcer (11/17/2011), History of total left hip replacement (2011), HTN (hypertension), total knee replacement (2017), Hyperlipemia, Hyperplastic polyp of intestine (11/17/2011), Hypertension, Measles, Menopause, Migraine, and Osteoarthritis.  Surgical History Patient  has a past surgical history that includes Total vaginal hysterectomy (2012); Cataract extraction (Bilateral); Retinal detachment surgery; Hip Arthroplasty (Left, 2011); Joint replacement; Total knee arthroplasty (Right, 11/03/2015); and Carpal tunnel release (Right).  Family History Pateint's family history includes Diabetes in her mother, sister, and sister; Heart attack in her father and mother; Kidney cancer in her father.  Social History Patient  reports that she has never smoked. She has never used smokeless tobacco. She reports that she does not drink alcohol and does not use drugs.    Objective:  Vitals:   02/13/20 1031  BP: 100/62  Pulse: 77  Temp: 97.6 F (36.4 C)  TempSrc:  Temporal  SpO2: 97%  Weight: 140 lb 9.6 oz (63.8 kg)  Height: 5' (1.524 m)    Body mass index is 27.46 kg/m.  Physical Exam Vitals reviewed.  Constitutional:      Appearance: Normal appearance. She is well-developed and normal weight.  HENT:     Head: Normocephalic and atraumatic.     Right Ear: Tympanic membrane, ear canal and external ear normal.     Left Ear: Tympanic membrane, ear canal and external ear normal.  Eyes:     Conjunctiva/sclera: Conjunctivae normal.     Pupils: Pupils are equal, round, and reactive to light.  Neck:     Thyroid: No thyromegaly.  Cardiovascular:     Rate and Rhythm: Normal rate and regular rhythm.     Pulses: Normal pulses.     Heart sounds: Normal heart sounds. No murmur heard.   Pulmonary:     Effort: Pulmonary effort is normal.     Breath sounds: Normal breath sounds.  Abdominal:     General: Abdomen is flat. Bowel sounds are normal. There is no distension.     Palpations: Abdomen is soft.     Tenderness: There is no abdominal tenderness.  Musculoskeletal:        General: Swelling and tenderness present.     Cervical back: Normal range of motion and neck supple.     Comments: Right hand dorsum with large edematous and tender area over base of distal dorsum to base of wrist. Has hand grip intact. TTP over MCP of 2-3rd digit.   Lymphadenopathy:     Cervical: No cervical adenopathy.  Skin:    General: Skin is warm and dry.     Capillary Refill: Capillary refill takes less than 2 seconds.     Findings: No rash.  Neurological:     General: No focal deficit present.     Mental Status: She is alert and oriented to person, place, and time.     Cranial Nerves: No cranial nerve deficit.     Coordination: Coordination normal.     Deep Tendon Reflexes: Reflexes normal.  Psychiatric:        Mood and Affect: Mood normal.        Behavior: Behavior normal.        Assessment/plan: 1. Hypertension associated with diabetes ([place]) Blood pressure  is to goal. Continue current anti-hypertensive medications-dyazide. Refills not given and routine lab work will be done today. Recommended routine exercise and healthy diet including DASH diet and mediterranean diet. Encouraged weight loss. F/u in 6 months.   - CBC with Differential/Platelet; Future - COMPLETE METABOLIC PANEL WITH GFR; Future - COMPLETE METABOLIC PANEL WITH GFR - CBC with Differential/Platelet  2. Type 2 diabetes mellitus with hyperglycemia, without long-term current use of insulin (HCC) -a1c/labs today -referral for eye exam.  -foot exam next visit.  -on statin.  -has really improved diet/sugars better.  -f/u in 3-6 months.  - Hemoglobin A1c; Future - Ambulatory referral to Ophthalmology - Hemoglobin A1c  3. Closed dislocation of metacarpophalangeal (MCP) joint of left index finger  - Ambulatory referral to Orthopedic Surgery  3. Left hand hematoma -continue ice and reassured her takes time to reabsorb.   Recommended flu shot now and then covid booster in 2 weeks.   This visit occurred during the SARS-CoV-2 public health emergency.  Safety protocols were in place, including  screening questions prior to the visit, additional usage of staff PPE, and extensive cleaning of exam room while observing appropriate contact time as indicated for disinfecting solutions.      Return in about 3 months (around 05/13/2020) for diabetes/foot exam. .   Orma Flaming, MD [place]   02/13/2020

## 2020-02-13 NOTE — Patient Instructions (Addendum)
-  referral to ortho to see Dr. Amedeo Plenty for your dislocated MCP joint. Need to make sure not complex.  -referral to eye doc for yearly eye exam.   -I think you have a hematoma on your hand. Ice pack and voltaren gel four times a day  -routine labs today. i'll call you about changes in diabetes meds if needed  -flu shot now and then booster in 2 weeks.   See you back in 3-6 weeks depending on a1c.

## 2020-02-14 DIAGNOSIS — M7981 Nontraumatic hematoma of soft tissue: Secondary | ICD-10-CM | POA: Diagnosis not present

## 2020-02-14 DIAGNOSIS — M13831 Other specified arthritis, right wrist: Secondary | ICD-10-CM | POA: Diagnosis not present

## 2020-02-14 LAB — COMPLETE METABOLIC PANEL WITH GFR
AG Ratio: 1.8 (calc) (ref 1.0–2.5)
ALT: 14 U/L (ref 6–29)
AST: 17 U/L (ref 10–35)
Albumin: 4.3 g/dL (ref 3.6–5.1)
Alkaline phosphatase (APISO): 73 U/L (ref 37–153)
BUN/Creatinine Ratio: 15 (calc) (ref 6–22)
BUN: 18 mg/dL (ref 7–25)
CO2: 29 mmol/L (ref 20–32)
Calcium: 9.6 mg/dL (ref 8.6–10.4)
Chloride: 100 mmol/L (ref 98–110)
Creat: 1.23 mg/dL — ABNORMAL HIGH (ref 0.60–0.93)
GFR, Est African American: 50 mL/min/{1.73_m2} — ABNORMAL LOW (ref >=60)
GFR, Est Non African American: 43 mL/min/{1.73_m2} — ABNORMAL LOW (ref >=60)
Globulin: 2.4 g/dL (calc) (ref 1.9–3.7)
Glucose, Bld: 138 mg/dL — ABNORMAL HIGH (ref 65–99)
Potassium: 4 mmol/L (ref 3.5–5.3)
Sodium: 139 mmol/L (ref 135–146)
Total Bilirubin: 0.6 mg/dL (ref 0.2–1.2)
Total Protein: 6.7 g/dL (ref 6.1–8.1)

## 2020-02-14 LAB — CBC WITH DIFFERENTIAL/PLATELET
Absolute Monocytes: 378 cells/uL (ref 200–950)
Basophils Absolute: 18 cells/uL (ref 0–200)
Basophils Relative: 0.4 %
Eosinophils Absolute: 99 cells/uL (ref 15–500)
Eosinophils Relative: 2.2 %
HCT: 38.5 % (ref 35.0–45.0)
Hemoglobin: 13.1 g/dL (ref 11.7–15.5)
Lymphs Abs: 1323 cells/uL (ref 850–3900)
MCH: 31.6 pg (ref 27.0–33.0)
MCHC: 34 g/dL (ref 32.0–36.0)
MCV: 93 fL (ref 80.0–100.0)
MPV: 9.7 fL (ref 7.5–12.5)
Monocytes Relative: 8.4 %
Neutro Abs: 2682 cells/uL (ref 1500–7800)
Neutrophils Relative %: 59.6 %
Platelets: 233 10*3/uL (ref 140–400)
RBC: 4.14 10*6/uL (ref 3.80–5.10)
RDW: 11.8 % (ref 11.0–15.0)
Total Lymphocyte: 29.4 %
WBC: 4.5 10*3/uL (ref 3.8–10.8)

## 2020-02-14 LAB — HEMOGLOBIN A1C
Hgb A1c MFr Bld: 7.1 % of total Hgb — ABNORMAL HIGH (ref <5.7)
Mean Plasma Glucose: 157 (calc)
eAG (mmol/L): 8.7 (calc)

## 2020-02-15 ENCOUNTER — Telehealth: Payer: Self-pay

## 2020-02-15 NOTE — Telephone Encounter (Signed)
Patient wants to speak with the nurse about her pessary.

## 2020-02-15 NOTE — Telephone Encounter (Signed)
H/o midline cystocele  Pessary replaced at [facility] 01/30/20 with JJ H/o vaginal irritation   Spoke with pt. Pt states had pessary placed back after vaginal irritation on 11/17 by Dr Talbert Nan. Pt states today noticed could feel "edge of pessary at vaginal opening" pt states thinks moved after BM/voiding. Pt wanting to know if can push back in place. Denies pain, difficulty voiding, vaginal bleeding/spotting.  Pt advised can gently push back in place and offered OV on Monday if pessary fell out completely or couldn't get it back in place. Pt agreeable and states will call back on Monday if needs OV.  Routing to Dr Talbert Nan for review Encounter closed

## 2020-02-18 ENCOUNTER — Ambulatory Visit: Payer: Medicare Other | Admitting: Obstetrics and Gynecology

## 2020-02-18 ENCOUNTER — Telehealth: Payer: Self-pay

## 2020-02-18 ENCOUNTER — Other Ambulatory Visit: Payer: Self-pay

## 2020-02-18 ENCOUNTER — Encounter: Payer: Self-pay | Admitting: Obstetrics and Gynecology

## 2020-02-18 VITALS — BP 110/70 | HR 77 | Ht 60.0 in | Wt 141.0 lb

## 2020-02-18 DIAGNOSIS — T8389XA Other specified complication of genitourinary prosthetic devices, implants and grafts, initial encounter: Secondary | ICD-10-CM | POA: Diagnosis not present

## 2020-02-18 DIAGNOSIS — Z4689 Encounter for fitting and adjustment of other specified devices: Secondary | ICD-10-CM | POA: Diagnosis not present

## 2020-02-18 DIAGNOSIS — N898 Other specified noninflammatory disorders of vagina: Secondary | ICD-10-CM | POA: Diagnosis not present

## 2020-02-18 NOTE — Progress Notes (Signed)
GYNECOLOGY  VISIT   HPI: 74 y.o.   Divorced White or Caucasian Not Hispanic or Latino  female   [phone] with No LMP recorded (lmp unknown). Patient has had a hysterectomy.   here for  Pessary problems.  Thursday she was climbing a ladder and lifting boxes last week. When she got off of the ladder she could feel her pessary was out of place. She says that she can see the pink of the pessary. She tried to pull the pessary out, but couldn't.  GYNECOLOGIC HISTORY: No LMP recorded (lmp unknown). Patient has had a hysterectomy. Contraception:PMP Menopausal hormone therapy: estradiol cream         OB History    Gravida  2   Para  2   Term  2   Preterm      AB      Living  1     SAB      TAB      Ectopic      Multiple      Live Births  2              Patient Active Problem List   Diagnosis Date Noted  . Aortic atherosclerosis ([place]) 06/22/2019  . Type 2 diabetes mellitus with hyperglycemia, without long-term current use of insulin ([place]) 06/18/2016  . OA (osteoarthritis) of knee 11/03/2015  . Hyperlipidemia associated with type 2 diabetes mellitus ([place]) 07/23/2014  . Genu valgum 08/11/2012  . Hypertension associated with diabetes ([place]) 02/25/2010    Past Medical History:  Diagnosis Date  . Anemia   . Cataract   . Diabetes mellitus without complication ([place])   . Gastric ulcer 11/17/2011  . History of total left hip replacement 2011  . HTN (hypertension)   . Hx of total knee replacement 2017   11-03-2015   . Hyperlipemia   . Hyperplastic polyp of intestine 11/17/2011  . Hypertension   . Measles   . Menopause   . Migraine   . Osteoarthritis     Past Surgical History:  Procedure Laterality Date  . CARPAL TUNNEL RELEASE Right   . CATARACT EXTRACTION Bilateral   . HIP ARTHROPLASTY Left 2011  . JOINT REPLACEMENT    . RETINAL DETACHMENT SURGERY    . TOTAL KNEE ARTHROPLASTY Right 11/03/2015   Procedure: RIGHT TOTAL KNEE ARTHROPLASTY;  Surgeon: Gaynelle Arabian,  MD;  Location: [facility];  Service: Orthopedics;  Laterality: Right;  . TOTAL VAGINAL HYSTERECTOMY  2012   TVH, anterior repair, TVT (no mesh used for the anterior repair)    Current Outpatient Medications  Medication Sig Dispense Refill  . Accu-Chek FastClix Lancets MISC Check blood sugars 1-2 times per day. 100 each 2  . albuterol (VENTOLIN HFA) 108 (90 Base) MCG/ACT inhaler TAKE 2 PUFFS BY MOUTH EVERY 6 HOURS AS NEEDED FOR WHEEZE OR SHORTNESS OF BREATH 6.7 each 1  . aspirin EC 81 MG tablet Take 81 mg by mouth daily.    . Cyanocobalamin (B-12 PO) Take 1 tablet by mouth daily.    . fluticasone (FLONASE) 50 MCG/ACT nasal spray PLACE 1 SPRAY INTO BOTH NOSTRILS 2 (TWO) TIMES DAILY AS NEEDED FOR ALLERGIES OR RHINITIS. 48 mL 0  . glucose blood (ACCU-CHEK GUIDE) test strip TEST FOUR TIMES DAILY. 100 each 6  . ibuprofen (ADVIL) 200 MG tablet Take 400 mg by mouth every 6 (six) hours as needed for headache or mild pain.    . meclizine (ANTIVERT) 25 MG tablet TAKE 1 TABLET (25 MG  TOTAL) BY MOUTH 3 (THREE) TIMES DAILY AS NEEDED FOR DIZZINESS. 60 tablet 1  . metFORMIN (GLUCOPHAGE) 500 MG tablet Take 1 tablet (500 mg total) by mouth 2 (two) times daily with a meal. 180 tablet 3  . Multiple Vitamin (MULTIVITAMIN WITH MINERALS) TABS tablet Take 1 tablet by mouth daily.    . NONFORMULARY OR COMPOUNDED ITEM Estradiol cream 0.02%, use 1/2 a gram vaginally 2 x a week at hs. 3 month supply sent, 3 refills 3 each 1  . phentermine (ADIPEX-P) 37.5 MG tablet Take by mouth.    . pravastatin (PRAVACHOL) 40 MG tablet TAKE 1 TABLET BY MOUTH EVERY DAY 90 tablet 0  . traZODone (DESYREL) 50 MG tablet TAKE 1/2 TO 1 TABLET BY MOUTH AT BEDTIME AS NEEDED FOR SLEEP 90 tablet 2  . triamcinolone cream (KENALOG) 0.1 % Apply 1 application topically 4 (four) times daily as needed (bug bites).  (Patient not taking: Reported on 02/13/2020)    . triamterene-hydrochlorothiazide (DYAZIDE) 37.5-25 MG capsule Take 1 each (1 capsule total) by  mouth daily. 90 capsule 3   No current facility-administered medications for this visit.     ALLERGIES: Probiotic [acidophilus]  Family History  Problem Relation Age of Onset  . Diabetes Mother        deceased  . Heart attack Mother   . Diabetes Sister        x2  . Kidney cancer Father   . Heart attack Father   . Diabetes Sister     Social History   Socioeconomic History  . Marital status: Divorced    Spouse name: Not on file  . Number of children: Not on file  . Years of education: Not on file  . Highest education level: Not on file  Occupational History  . Occupation: retired    Fish farm manager: RETIRED  Tobacco Use  . Smoking status: Never Smoker  . Smokeless tobacco: Never Used  Vaping Use  . Vaping Use: Never used  Substance and Sexual Activity  . Alcohol use: No  . Drug use: No  . Sexual activity: Not Currently    Partners: Male    Birth control/protection: Post-menopausal, Surgical  Other Topics Concern  . Not on file  Social History Narrative   Divorced, 2 children; retired.    Custody of grandchildren    Social Determinants of Health   Financial Resource Strain:   . Difficulty of Paying Living Expenses: Not on file  Food Insecurity:   . Worried About Charity fundraiser in the Last Year: Not on file  . Ran Out of Food in the Last Year: Not on file  Transportation Needs:   . Lack of Transportation (Medical): Not on file  . Lack of Transportation (Non-Medical): Not on file  Physical Activity:   . Days of Exercise per Week: Not on file  . Minutes of Exercise per Session: Not on file  Stress:   . Feeling of Stress : Not on file  Social Connections:   . Frequency of Communication with Friends and Family: Not on file  . Frequency of Social Gatherings with Friends and Family: Not on file  . Attends Religious Services: Not on file  . Active Member of Clubs or Organizations: Not on file  . Attends Archivist Meetings: Not on file  . Marital  Status: Not on file  Intimate Partner Violence:   . Fear of Current or Ex-Partner: Not on file  . Emotionally Abused: Not on file  .  Physically Abused: Not on file  . Sexually Abused: Not on file    Review of Systems  All other systems reviewed and are negative.   PHYSICAL EXAMINATION:    LMP  (LMP Unknown)     General appearance: alert, cooperative and appears stated age N  Pelvic: External genitalia:  no lesions              Urethra:  normal appearing urethra with no masses, tenderness or lesions              Bartholins and Skenes: normal                 Vagina: the pessary was removed (low in the vagina). The pessary was removed and cleaned. She has continued small ulceration at her vaginal apex. Estrogen cream placed, pessary replaced.              Cervix: absent  Chaperone was present for exam.  ASSESSMENT Pessary dislodged with lifting boxes, climging a latter Vaginal ulceration at the apex of the vagina, not improving, not worsening    PLAN F/U later this month Encouraged her to consider surgery. She is thinking about her options.     Over 20 minutes in total patient care.

## 2020-02-18 NOTE — Telephone Encounter (Signed)
Patient thinks her pessary has dropped down or moved out of place. She is having vaginal pain with movement. Requesting appointment.  Made appointment with Dr.Jertson today at 3:00pm, to arrive at 2:45pm to register. Patient voiced understanding.

## 2020-02-18 NOTE — Telephone Encounter (Signed)
Patient says her pessary may be misplaced because she is in pain when she walk.

## 2020-02-25 ENCOUNTER — Encounter: Payer: Self-pay | Admitting: Family Medicine

## 2020-02-25 NOTE — Telephone Encounter (Signed)
LVM asking to call back to get scheduled.

## 2020-02-25 NOTE — Telephone Encounter (Signed)
Please schedule pt for virtual appointment for Diabetes follow up.  Thank you

## 2020-02-26 NOTE — Telephone Encounter (Signed)
Pt scheduled 02/28/2020 @ 2:30p

## 2020-02-28 ENCOUNTER — Telehealth (INDEPENDENT_AMBULATORY_CARE_PROVIDER_SITE_OTHER): Payer: Medicare Other | Admitting: Family Medicine

## 2020-02-28 ENCOUNTER — Other Ambulatory Visit: Payer: Self-pay

## 2020-02-28 ENCOUNTER — Encounter: Payer: Self-pay | Admitting: Family Medicine

## 2020-02-28 VITALS — Ht 60.0 in | Wt 141.1 lb

## 2020-02-28 DIAGNOSIS — E1165 Type 2 diabetes mellitus with hyperglycemia: Secondary | ICD-10-CM | POA: Diagnosis not present

## 2020-02-28 MED ORDER — RYBELSUS 3 MG PO TABS: 3.0000 mg | ORAL_TABLET | Freq: Every day | ORAL | 2 refills | Status: DC

## 2020-02-28 NOTE — Progress Notes (Signed)
Patient: Misty Cervantes MRN: [id number] DOB: [birth year] PCP: Orma Flaming, MD     I connected with Misty Cervantes on 02/28/20 at 2:52pm by a video enabled telemedicine application and verified that I am speaking with the correct person using two identifiers.  Location patient: Home Location provider: [facility], Office Persons participating in this virtual visit: Misty Cervantes and Dr. Rogers Blocker  I discussed the limitations of evaluation and management by telemedicine and the availability of in person appointments. The patient expressed understanding and agreed to proceed.   Interactive audio and video telecommunications were attempted between this provider and patient, however failed, due to patient having technical difficulties OR patient did not have access to video capability.  We continued and completed visit with audio only.   Subjective:  Chief Complaint  Patient presents with  . Diabetes  . Medication Management    HPI: The patient is a 74 y.o. female who presents today for Diabetes. Pt was confused about medication instructions. I put her back on metformin after her A1c was 8.0; however, 3 months later her creatinine bumped up to 1.23 with gFR of 43. I had her stop her metformin and discussed GLP-1 drugs. She was confused with lab message so we scheduled an appointment to discuss medication change.    Review of Systems  Constitutional: Negative for appetite change.  Respiratory: Negative for shortness of breath and wheezing.   Cardiovascular: Negative for palpitations and leg swelling.    Allergies Patient is allergic to probiotic [acidophilus].  Past Medical History Patient  has a past medical history of Anemia, Cataract, Diabetes mellitus without complication ([place]), Gastric ulcer (11/17/2011), History of total left hip replacement (2011), HTN (hypertension), total knee replacement (2017), Hyperlipemia, Hyperplastic polyp of intestine (11/17/2011), Hypertension,  Measles, Menopause, Migraine, and Osteoarthritis.  Surgical History Patient  has a past surgical history that includes Total vaginal hysterectomy (2012); Cataract extraction (Bilateral); Retinal detachment surgery; Hip Arthroplasty (Left, 2011); Joint replacement; Total knee arthroplasty (Right, 11/03/2015); and Carpal tunnel release (Right).  Family History Pateint's family history includes Diabetes in her mother, sister, and sister; Heart attack in her father and mother; Kidney cancer in her father.  Social History Patient  reports that she has never smoked. She has never used smokeless tobacco. She reports that she does not drink alcohol and does not use drugs.    Objective: Vitals:   02/28/20 1418  Weight: 141 lb 1.5 oz (64 kg)  Height: 5' (1.524 m)    Body mass index is 27.56 kg/m.  Physical Exam Vitals reviewed.  Pulmonary:     Effort: Pulmonary effort is normal.        Assessment/plan: 1. Type 2 diabetes mellitus with hyperglycemia, without long-term current use of insulin (HCC) -I stopped metformin after AKI and discussed why we stopped this -discussed rybelsus and she has no contraindication to start this. No personal or family history of medullary thyroid cancer or MEN. Side effects of this medication discussed. Discussed if any problems swallowing, vocal change, neck mass to let me know as increase incidence of thyroid cancer was found in animal studies.  -f/u with a1c check/renal check in 3 months time.  -if too expensive she is to let me know.      Return in about 3 months (around 05/28/2020) for diabetes .     Orma Flaming, MD [place]  02/28/2020

## 2020-03-12 ENCOUNTER — Ambulatory Visit: Payer: Medicare Other | Admitting: Obstetrics and Gynecology

## 2020-03-12 ENCOUNTER — Encounter: Payer: Self-pay | Admitting: Obstetrics and Gynecology

## 2020-03-12 ENCOUNTER — Other Ambulatory Visit: Payer: Self-pay

## 2020-03-12 VITALS — BP 130/74 | HR 103 | Ht 60.0 in | Wt 140.2 lb

## 2020-03-12 DIAGNOSIS — N952 Postmenopausal atrophic vaginitis: Secondary | ICD-10-CM

## 2020-03-12 DIAGNOSIS — T8389XA Other specified complication of genitourinary prosthetic devices, implants and grafts, initial encounter: Secondary | ICD-10-CM

## 2020-03-12 DIAGNOSIS — Z4689 Encounter for fitting and adjustment of other specified devices: Secondary | ICD-10-CM

## 2020-03-12 DIAGNOSIS — N8111 Cystocele, midline: Secondary | ICD-10-CM

## 2020-03-12 DIAGNOSIS — N898 Other specified noninflammatory disorders of vagina: Secondary | ICD-10-CM

## 2020-03-12 NOTE — Progress Notes (Signed)
GYNECOLOGY  VISIT   HPI: 74 y.o.   Divorced White or Caucasian Not Hispanic or Latino  female   [phone] with No LMP recorded (lmp unknown). Patient has had a hysterectomy.   here for pessary follow up. She states that she is doing well with her pessary.  H/O grade 3 cystocele, recurring issues with vaginal irritation from her pessary. Using vaginal estrogen. She was changed from the ring to the cube pessary. She has some pressure if she is on her feet all day.   No GSI, occasional urge incontinence  Previously discussed the option of cystocele repair, possible colpocleisis, vs referral to urogyn and possible cystocele repair with mesh  GYNECOLOGIC HISTORY: No LMP recorded (lmp unknown). Patient has had a hysterectomy. Contraception:PMP Menopausal hormone therapy: yes         OB History    Gravida  2   Para  2   Term  2   Preterm      AB      Living  1     SAB      IAB      Ectopic      Multiple      Live Births  2              Patient Active Problem List   Diagnosis Date Noted  . Aortic atherosclerosis ([place]) 06/22/2019  . Type 2 diabetes mellitus with hyperglycemia, without long-term current use of insulin ([place]) 06/18/2016  . OA (osteoarthritis) of knee 11/03/2015  . Hyperlipidemia associated with type 2 diabetes mellitus ([place]) 07/23/2014  . Genu valgum 08/11/2012  . Hypertension associated with diabetes ([place]) 02/25/2010    Past Medical History:  Diagnosis Date  . Anemia   . Cataract   . Diabetes mellitus without complication ([place])   . Gastric ulcer 11/17/2011  . History of total left hip replacement 2011  . HTN (hypertension)   . Hx of total knee replacement 2017   11-03-2015   . Hyperlipemia   . Hyperplastic polyp of intestine 11/17/2011  . Hypertension   . Measles   . Menopause   . Migraine   . Osteoarthritis     Past Surgical History:  Procedure Laterality Date  . CARPAL TUNNEL RELEASE Right   . CATARACT EXTRACTION Bilateral   . HIP  ARTHROPLASTY Left 2011  . JOINT REPLACEMENT    . RETINAL DETACHMENT SURGERY    . TOTAL KNEE ARTHROPLASTY Right 11/03/2015   Procedure: RIGHT TOTAL KNEE ARTHROPLASTY;  Surgeon: Gaynelle Arabian, MD;  Location: [facility];  Service: Orthopedics;  Laterality: Right;  . TOTAL VAGINAL HYSTERECTOMY  2012   TVH, anterior repair, TVT (no mesh used for the anterior repair)    Current Outpatient Medications  Medication Sig Dispense Refill  . Accu-Chek FastClix Lancets MISC Check blood sugars 1-2 times per day. 100 each 2  . albuterol (VENTOLIN HFA) 108 (90 Base) MCG/ACT inhaler TAKE 2 PUFFS BY MOUTH EVERY 6 HOURS AS NEEDED FOR WHEEZE OR SHORTNESS OF BREATH 6.7 each 1  . aspirin EC 81 MG tablet Take 81 mg by mouth daily.    . Cyanocobalamin (B-12 PO) Take 1 tablet by mouth daily.    . fluticasone (FLONASE) 50 MCG/ACT nasal spray PLACE 1 SPRAY INTO BOTH NOSTRILS 2 (TWO) TIMES DAILY AS NEEDED FOR ALLERGIES OR RHINITIS. 48 mL 0  . glucose blood (ACCU-CHEK GUIDE) test strip TEST FOUR TIMES DAILY. 100 each 6  . ibuprofen (ADVIL) 200 MG tablet Take 400 mg by  mouth every 6 (six) hours as needed for headache or mild pain.    . meclizine (ANTIVERT) 25 MG tablet TAKE 1 TABLET (25 MG TOTAL) BY MOUTH 3 (THREE) TIMES DAILY AS NEEDED FOR DIZZINESS. 60 tablet 1  . Multiple Vitamin (MULTIVITAMIN WITH MINERALS) TABS tablet Take 1 tablet by mouth daily.    . NONFORMULARY OR COMPOUNDED ITEM Estradiol cream 0.02%, use 1/2 a gram vaginally 2 x a week at hs. 3 month supply sent, 3 refills 3 each 1  . pravastatin (PRAVACHOL) 40 MG tablet TAKE 1 TABLET BY MOUTH EVERY DAY 90 tablet 0  . Semaglutide (RYBELSUS) 3 MG TABS Take 3 mg by mouth daily. 30 tablet 2  . traZODone (DESYREL) 50 MG tablet TAKE 1/2 TO 1 TABLET BY MOUTH AT BEDTIME AS NEEDED FOR SLEEP 90 tablet 2  . triamcinolone cream (KENALOG) 0.1 % Apply 1 application topically 4 (four) times daily as needed (bug bites).    . triamterene-hydrochlorothiazide (DYAZIDE) 37.5-25 MG  capsule Take 1 each (1 capsule total) by mouth daily. 90 capsule 3   No current facility-administered medications for this visit.     ALLERGIES: Probiotic [acidophilus]  Family History  Problem Relation Age of Onset  . Diabetes Mother        deceased  . Heart attack Mother   . Diabetes Sister        x2  . Kidney cancer Father   . Heart attack Father   . Diabetes Sister     Social History   Socioeconomic History  . Marital status: Divorced    Spouse name: Not on file  . Number of children: Not on file  . Years of education: Not on file  . Highest education level: Not on file  Occupational History  . Occupation: retired    Associate Professor: RETIRED  Tobacco Use  . Smoking status: Never Smoker  . Smokeless tobacco: Never Used  Vaping Use  . Vaping Use: Never used  Substance and Sexual Activity  . Alcohol use: No  . Drug use: No  . Sexual activity: Not Currently    Partners: Male    Birth control/protection: Post-menopausal, Surgical  Other Topics Concern  . Not on file  Social History Narrative   Divorced, 2 children; retired.    Custody of grandchildren    Social Determinants of Health   Financial Resource Strain: Not on file  Food Insecurity: Not on file  Transportation Needs: Not on file  Physical Activity: Not on file  Stress: Not on file  Social Connections: Not on file  Intimate Partner Violence: Not on file    Review of Systems  All other systems reviewed and are negative.   PHYSICAL EXAMINATION:    BP 130/74   Pulse (!) 103   Ht 5' (1.524 m)   Wt 140 lb 3.2 oz (63.6 kg)   LMP  (LMP Unknown)   SpO2 98%   BMI 27.38 kg/m     General appearance: alert, cooperative and appears stated age  Pelvic: External genitalia:  no lesions              Urethra:  normal appearing urethra with no masses, tenderness or lesions              Bartholins and Skenes: normal                 Vagina: normal appearing vagina with normal color and discharge, no lesions.  The pessary was removed and cleaned. Vaginal ulceration  at the vaginal cuff ~2.5 cm. Pessary left out              Cervix: absent               Chaperone was present for exam.  ASSESSMENT Cystocele, recurrent issues with vaginal irritation with different pessaries No GSI with the pessary in or out, mild urge incontinence Vaginal atrophy, using vaginal estrogen    PLAN Will leave the pessary out F/U in 2 weeks, increase to using every other day for the next 2 weeks. We discussed surgery again. Will access her prolapse again at her next visit after the pessary has been out for a few weeks.  Reviewed the options of cystocele repair, colpocleisis if possible, cystocele repair with mesh and sacrocolpopexy. She understands the risk of recurrent prolapse and that this risk is highest with just cystocele repair. We discussed risks and benefits. Questions answered.   ~30 minutes in total patient care.

## 2020-03-13 ENCOUNTER — Encounter: Payer: Self-pay | Admitting: Obstetrics and Gynecology

## 2020-03-25 ENCOUNTER — Telehealth: Payer: Self-pay

## 2020-03-25 NOTE — Telephone Encounter (Signed)
Patient called because she had to c/r her appt for tomorrow due to Covid exposure and awaiting test results. The pessary has been out as you recommended since her office visit 03/12/20. She wanted to report that she has seen some spotting in the evening when she wipes. Usually its on the tissue with her first wipe and then no more. At times it is red.  She is rescheduled for follow up on 04/09/20 and asked if anything she should do in the meantime.

## 2020-03-26 ENCOUNTER — Ambulatory Visit: Payer: Self-pay | Admitting: Obstetrics and Gynecology

## 2020-03-26 NOTE — Telephone Encounter (Signed)
Spoke with patient and read her Dr. Gentry Fitz reply. Reassured her.

## 2020-03-26 NOTE — Telephone Encounter (Signed)
The patient has vaginal irritation from the pessary that could be bleeding (the reason the pessary was left out), she also has significant prolapse which could be getting irritated from rubbing exteriorly. She should continue to use her estrogen cream and can also use coconut oil internally or externally as needed. As long as the bleeding isn't heavy I wouldn't be overly concerned.

## 2020-04-07 ENCOUNTER — Other Ambulatory Visit: Payer: Self-pay | Admitting: Family Medicine

## 2020-04-07 DIAGNOSIS — J069 Acute upper respiratory infection, unspecified: Secondary | ICD-10-CM

## 2020-04-07 NOTE — Telephone Encounter (Signed)
Called into pharmacy

## 2020-04-08 ENCOUNTER — Other Ambulatory Visit: Payer: Self-pay

## 2020-04-08 ENCOUNTER — Encounter: Payer: Self-pay | Admitting: Obstetrics and Gynecology

## 2020-04-08 ENCOUNTER — Ambulatory Visit: Payer: Medicare Other | Admitting: Obstetrics and Gynecology

## 2020-04-08 VITALS — BP 110/60 | HR 92 | Ht 60.0 in | Wt 144.0 lb

## 2020-04-08 DIAGNOSIS — N8111 Cystocele, midline: Secondary | ICD-10-CM

## 2020-04-08 DIAGNOSIS — N819 Female genital prolapse, unspecified: Secondary | ICD-10-CM | POA: Diagnosis not present

## 2020-04-08 NOTE — Progress Notes (Signed)
GYNECOLOGY  VISIT   HPI: 75 y.o.   Divorced White or Caucasian Not Hispanic or Latino  female   [phone] with No LMP recorded (lmp unknown). Patient has had a hysterectomy.   here for vaginal ulcer follow-up. Patient c/o "bladder coming out". Patient states that "it's been miserable without pessary".  H/O grade 3 cystocele, recurring issues with vaginal irritation from her pessary. Using vaginal estrogen.  Her pessary was left out on 03/12/20 secondary to vaginal cuff ulceration. She has had occasional spotting. No spotting for the last few days. Hard to void with the pessary out. She has to reduce the cystocele at times. Uncomfortable without the pessary.   GYNECOLOGIC HISTORY: No LMP recorded (lmp unknown). Patient has had a hysterectomy. Contraception:PMP Menopausal hormone therapy: Estradiol cream        OB History    Gravida  2   Para  2   Term  2   Preterm      AB      Living  1     SAB      IAB      Ectopic      Multiple      Live Births  2              Patient Active Problem List   Diagnosis Date Noted  . Aortic atherosclerosis ([place]) 06/22/2019  . Type 2 diabetes mellitus with hyperglycemia, without long-term current use of insulin ([place]) 06/18/2016  . OA (osteoarthritis) of knee 11/03/2015  . Hyperlipidemia associated with type 2 diabetes mellitus ([place]) 07/23/2014  . Genu valgum 08/11/2012  . Hypertension associated with diabetes ([place]) 02/25/2010    Past Medical History:  Diagnosis Date  . Anemia   . Cataract   . Diabetes mellitus without complication ([place])   . Gastric ulcer 11/17/2011  . History of total left hip replacement 2011  . HTN (hypertension)   . Hx of total knee replacement 2017   11-03-2015   . Hyperlipemia   . Hyperplastic polyp of intestine 11/17/2011  . Hypertension   . Measles   . Menopause   . Migraine   . Osteoarthritis     Past Surgical History:  Procedure Laterality Date  . CARPAL TUNNEL RELEASE Right   . CATARACT  EXTRACTION Bilateral   . HIP ARTHROPLASTY Left 2011  . JOINT REPLACEMENT    . RETINAL DETACHMENT SURGERY    . TOTAL KNEE ARTHROPLASTY Right 11/03/2015   Procedure: RIGHT TOTAL KNEE ARTHROPLASTY;  Surgeon: Gaynelle Arabian, MD;  Location: [facility];  Service: Orthopedics;  Laterality: Right;  . TOTAL VAGINAL HYSTERECTOMY  2012   TVH, anterior repair, TVT (no mesh used for the anterior repair)    Current Outpatient Medications  Medication Sig Dispense Refill  . Accu-Chek FastClix Lancets MISC Check blood sugars 1-2 times per day. 100 each 2  . acetaminophen (TYLENOL) 325 MG tablet Take 650 mg by mouth as needed.    Marland Kitchen albuterol (VENTOLIN HFA) 108 (90 Base) MCG/ACT inhaler TAKE 2 PUFFS BY MOUTH EVERY 6 HOURS AS NEEDED FOR WHEEZE OR SHORTNESS OF BREATH 6.7 each 1  . aspirin EC 81 MG tablet Take 81 mg by mouth daily.    . Cyanocobalamin (B-12 PO) Take 1 tablet by mouth daily.    . fluticasone (FLONASE) 50 MCG/ACT nasal spray PLACE 1 SPRAY INTO BOTH NOSTRILS 2 (TWO) TIMES DAILY AS NEEDED FOR ALLERGIES OR RHINITIS. 48 mL 0  . glucose blood (ACCU-CHEK GUIDE) test strip TEST FOUR TIMES  DAILY. 100 each 6  . meclizine (ANTIVERT) 25 MG tablet TAKE 1 TABLET (25 MG TOTAL) BY MOUTH 3 (THREE) TIMES DAILY AS NEEDED FOR DIZZINESS. 60 tablet 1  . Multiple Vitamin (MULTIVITAMIN WITH MINERALS) TABS tablet Take 1 tablet by mouth daily.    . NONFORMULARY OR COMPOUNDED ITEM Estradiol cream 0.02%, use 1/2 a gram vaginally 2 x a week at hs. 3 month supply sent, 3 refills 3 each 1  . pravastatin (PRAVACHOL) 40 MG tablet TAKE 1 TABLET BY MOUTH EVERY DAY 90 tablet 0  . traZODone (DESYREL) 50 MG tablet TAKE 1/2 TO 1 TABLET BY MOUTH AT BEDTIME AS NEEDED FOR SLEEP 90 tablet 2  . triamterene-hydrochlorothiazide (DYAZIDE) 37.5-25 MG capsule Take 1 each (1 capsule total) by mouth daily. 90 capsule 3  . ibuprofen (ADVIL) 200 MG tablet Take 400 mg by mouth every 6 (six) hours as needed for headache or mild pain. (Patient not taking:  Reported on 04/08/2020)    . Semaglutide (RYBELSUS) 3 MG TABS Take 3 mg by mouth daily. (Patient not taking: Reported on 04/08/2020) 30 tablet 2  . triamcinolone cream (KENALOG) 0.1 % Apply 1 application topically 4 (four) times daily as needed (bug bites). (Patient not taking: Reported on 04/08/2020)     No current facility-administered medications for this visit.     ALLERGIES: Probiotic [acidophilus]  Family History  Problem Relation Age of Onset  . Diabetes Mother        deceased  . Heart attack Mother   . Diabetes Sister        x2  . Kidney cancer Father   . Heart attack Father   . Diabetes Sister     Social History   Socioeconomic History  . Marital status: Divorced    Spouse name: Not on file  . Number of children: Not on file  . Years of education: Not on file  . Highest education level: Not on file  Occupational History  . Occupation: retired    Fish farm manager: RETIRED  Tobacco Use  . Smoking status: Never Smoker  . Smokeless tobacco: Never Used  Vaping Use  . Vaping Use: Never used  Substance and Sexual Activity  . Alcohol use: No  . Drug use: No  . Sexual activity: Not Currently    Partners: Male    Birth control/protection: Post-menopausal, Surgical  Other Topics Concern  . Not on file  Social History Narrative   Divorced, 2 children; retired.    Custody of grandchildren    Social Determinants of Health   Financial Resource Strain: Not on file  Food Insecurity: Not on file  Transportation Needs: Not on file  Physical Activity: Not on file  Stress: Not on file  Social Connections: Not on file  Intimate Partner Violence: Not on file    Review of Systems  Constitutional: Negative.   HENT: Negative.   Eyes: Negative.   Respiratory: Negative.   Cardiovascular: Negative.   Gastrointestinal: Negative.   Genitourinary: Negative.   Musculoskeletal: Negative.   Skin: Negative.   Neurological: Negative.   Endo/Heme/Allergies: Negative.    Psychiatric/Behavioral: Negative.     PHYSICAL EXAMINATION:    BP 110/60 (BP Location: Left Arm, Patient Position: Sitting, Cuff Size: Normal)   Pulse 92   Ht 5' (1.524 m)   Wt 144 lb (65.3 kg)   LMP  (LMP Unknown)   BMI 28.12 kg/m     General appearance: alert, cooperative and appears stated age   Pelvic: External genitalia:  no lesions              Urethra:  normal appearing urethra with no masses, tenderness or lesions              Bartholins and Skenes: normal                 Vagina: mildly atrophic vaginal mucosa, small healing laceration at the introitus at 8 o'clock. With standing and valsalva the patient has a grade 4 cystocele and a grade 3-4 vault prolapse, no major rectocele.               Cervix: absent              Bimanual Exam:  Uterus:  uterus absent              Adnexa: no mass, fullness, tenderness               Chaperone was present for exam.  1. Midline cystocele Very bothersome, controlled with the pessary (have tried different types), but with continuing issues with vaginal irritation. Pessary was replaced. She will continue with vaginal estrogen  2. Vaginal vault prolapse After her pessary has been out for a month, with standing and valsalva grade 3-4 vault prolapse is noted.   We discussed surgery again. She has a h/o a TVH, anterior repair and TVT. No GSI, with her pessary in she voids well.  Given the degree of apical prolapse I think her best options for treatment are sacrocolpopexy or colpocleisis. She hasn't been sexually active for 13 years and doesn't want to be sexually active in the future. We discussed the different procedures. She is interested in colpocleisis.   Over 30 minutes in total patient care, large amount in counseling about management options.

## 2020-04-10 ENCOUNTER — Encounter: Payer: Self-pay | Admitting: Family Medicine

## 2020-04-11 ENCOUNTER — Encounter: Payer: Self-pay | Admitting: Obstetrics and Gynecology

## 2020-04-11 ENCOUNTER — Other Ambulatory Visit: Payer: Self-pay | Admitting: Family Medicine

## 2020-04-11 ENCOUNTER — Other Ambulatory Visit: Payer: Self-pay

## 2020-04-11 ENCOUNTER — Telehealth: Payer: Self-pay | Admitting: *Deleted

## 2020-04-11 ENCOUNTER — Ambulatory Visit: Payer: Medicare Other | Admitting: Obstetrics and Gynecology

## 2020-04-11 VITALS — BP 106/64 | HR 68 | Resp 16 | Wt 144.0 lb

## 2020-04-11 DIAGNOSIS — M5440 Lumbago with sciatica, unspecified side: Secondary | ICD-10-CM

## 2020-04-11 DIAGNOSIS — Z4689 Encounter for fitting and adjustment of other specified devices: Secondary | ICD-10-CM | POA: Diagnosis not present

## 2020-04-11 DIAGNOSIS — R102 Pelvic and perineal pain: Secondary | ICD-10-CM | POA: Diagnosis not present

## 2020-04-11 MED ORDER — GLIPIZIDE ER 2.5 MG PO TB24: 2.5000 mg | ORAL_TABLET | Freq: Every day | ORAL | 1 refills | Status: DC

## 2020-04-11 NOTE — Telephone Encounter (Signed)
Patient called had pessary inserted on 04/09/19 reports she noticed discomfort yesterday with pessary and this am now "pain" reports so bad she is not able to walk hardly. Patient asked if she could be seen today for Dr.Jertson to exam her find out why this pessary is hurting. Appointments scheduled her today for 1:30pm

## 2020-04-11 NOTE — Progress Notes (Signed)
GYNECOLOGY  VISIT   HPI: 75 y.o.   Divorced White or Caucasian Not Hispanic or Latino  female   [phone] with No LMP recorded (lmp unknown). Patient has had a hysterectomy.here for pain with pessary  She started having left lower back pain that goes down her left leg. Some vaginal discomfort. No bleeding.   GYNECOLOGIC HISTORY: No LMP recorded (lmp unknown). Patient has had a hysterectomy. Contraception:hysterectomy Menopausal hormone therapy: none        OB History    Gravida  2   Para  2   Term  2   Preterm      AB      Living  1     SAB      IAB      Ectopic      Multiple      Live Births  2              Patient Active Problem List   Diagnosis Date Noted  . Aortic atherosclerosis ([place]) 06/22/2019  . Type 2 diabetes mellitus with hyperglycemia, without long-term current use of insulin ([place]) 06/18/2016  . OA (osteoarthritis) of knee 11/03/2015  . Hyperlipidemia associated with type 2 diabetes mellitus ([place]) 07/23/2014  . Genu valgum 08/11/2012  . Hypertension associated with diabetes ([place]) 02/25/2010    Past Medical History:  Diagnosis Date  . Anemia   . Cataract   . Diabetes mellitus without complication ([place])   . Gastric ulcer 11/17/2011  . History of total left hip replacement 2011  . HTN (hypertension)   . Hx of total knee replacement 2017   11-03-2015   . Hyperlipemia   . Hyperplastic polyp of intestine 11/17/2011  . Hypertension   . Measles   . Menopause   . Migraine   . Osteoarthritis     Past Surgical History:  Procedure Laterality Date  . CARPAL TUNNEL RELEASE Right   . CATARACT EXTRACTION Bilateral   . HIP ARTHROPLASTY Left 2011  . JOINT REPLACEMENT    . RETINAL DETACHMENT SURGERY    . TOTAL KNEE ARTHROPLASTY Right 11/03/2015   Procedure: RIGHT TOTAL KNEE ARTHROPLASTY;  Surgeon: Gaynelle Arabian, MD;  Location: [facility];  Service: Orthopedics;  Laterality: Right;  . TOTAL VAGINAL HYSTERECTOMY  2012   TVH, anterior repair, TVT (no mesh  used for the anterior repair)    Current Outpatient Medications  Medication Sig Dispense Refill  . Accu-Chek FastClix Lancets MISC Check blood sugars 1-2 times per day. 100 each 2  . acetaminophen (TYLENOL) 325 MG tablet Take 650 mg by mouth as needed.    Marland Kitchen albuterol (VENTOLIN HFA) 108 (90 Base) MCG/ACT inhaler TAKE 2 PUFFS BY MOUTH EVERY 6 HOURS AS NEEDED FOR WHEEZE OR SHORTNESS OF BREATH 6.7 each 1  . aspirin EC 81 MG tablet Take 81 mg by mouth daily.    . Cyanocobalamin (B-12 PO) Take 1 tablet by mouth daily.    . fluticasone (FLONASE) 50 MCG/ACT nasal spray PLACE 1 SPRAY INTO BOTH NOSTRILS 2 (TWO) TIMES DAILY AS NEEDED FOR ALLERGIES OR RHINITIS. 48 mL 0  . glucose blood (ACCU-CHEK GUIDE) test strip TEST FOUR TIMES DAILY. 100 each 6  . meclizine (ANTIVERT) 25 MG tablet TAKE 1 TABLET (25 MG TOTAL) BY MOUTH 3 (THREE) TIMES DAILY AS NEEDED FOR DIZZINESS. 60 tablet 1  . Multiple Vitamin (MULTIVITAMIN WITH MINERALS) TABS tablet Take 1 tablet by mouth daily.    . NONFORMULARY OR COMPOUNDED ITEM Estradiol cream 0.02%, use 1/2  a gram vaginally 2 x a week at hs. 3 month supply sent, 3 refills 3 each 1  . pravastatin (PRAVACHOL) 40 MG tablet TAKE 1 TABLET BY MOUTH EVERY DAY 90 tablet 0  . traZODone (DESYREL) 50 MG tablet TAKE 1/2 TO 1 TABLET BY MOUTH AT BEDTIME AS NEEDED FOR SLEEP 90 tablet 2  . triamcinolone cream (KENALOG) 0.1 % Apply 1 application topically 4 (four) times daily as needed (bug bites).    . triamterene-hydrochlorothiazide (DYAZIDE) 37.5-25 MG capsule Take 1 each (1 capsule total) by mouth daily. 90 capsule 3   No current facility-administered medications for this visit.     ALLERGIES: Probiotic [acidophilus]  Family History  Problem Relation Age of Onset  . Diabetes Mother        deceased  . Heart attack Mother   . Diabetes Sister        x2  . Kidney cancer Father   . Heart attack Father   . Diabetes Sister     Social History   Socioeconomic History  . Marital  status: Divorced    Spouse name: Not on file  . Number of children: Not on file  . Years of education: Not on file  . Highest education level: Not on file  Occupational History  . Occupation: retired    Fish farm manager: RETIRED  Tobacco Use  . Smoking status: Never Smoker  . Smokeless tobacco: Never Used  Vaping Use  . Vaping Use: Never used  Substance and Sexual Activity  . Alcohol use: No  . Drug use: No  . Sexual activity: Not Currently    Partners: Male    Birth control/protection: Surgical    Comment: hysterectomy  Other Topics Concern  . Not on file  Social History Narrative   Divorced, 2 children; retired.    Custody of grandchildren    Social Determinants of Health   Financial Resource Strain: Not on file  Food Insecurity: Not on file  Transportation Needs: Not on file  Physical Activity: Not on file  Stress: Not on file  Social Connections: Not on file  Intimate Partner Violence: Not on file    Review of Systems  Constitutional: Negative.   HENT: Negative.   Eyes: Negative.   Respiratory: Negative.   Cardiovascular: Negative.   Gastrointestinal: Negative.   Genitourinary:       Pain with pessary  Musculoskeletal: Negative.   Skin: Negative.   Neurological: Negative.   Endo/Heme/Allergies: Negative.   Psychiatric/Behavioral: Negative.     PHYSICAL EXAMINATION:    BP 106/64   Pulse 68   Resp 16   Wt 144 lb (65.3 kg)   LMP  (LMP Unknown)   BMI 28.12 kg/m     General appearance: alert, cooperative and appears stated age  Pelvic: External genitalia:  no lesions              Urethra:  normal appearing urethra with no masses, tenderness or lesions              Bartholins and Skenes: normal                 Vagina: the pessary was in place. It was removed. The patient felt her pain improved some so the pessary was left out.                Chaperone was present for exam.  Assessment/Plan: Lower back, hip, vaginal pain since pessary inserted earlier  this week. She previously did well with  this pessary. The pessary was removed and left out. She will leave the pessary out overnight and then try her old ring pessary with support if needed Colpocleisis surgery is being scheduled

## 2020-04-13 ENCOUNTER — Encounter (HOSPITAL_COMMUNITY): Payer: Self-pay | Admitting: Emergency Medicine

## 2020-04-13 ENCOUNTER — Emergency Department (HOSPITAL_COMMUNITY)
Admission: EM | Admit: 2020-04-13 | Discharge: 2020-04-13 | Disposition: A | Discharge status: Home / Self Care | Payer: Medicare Other | Attending: Emergency Medicine | Admitting: Emergency Medicine

## 2020-04-13 ENCOUNTER — Emergency Department (HOSPITAL_COMMUNITY): Payer: Medicare Other

## 2020-04-13 ENCOUNTER — Other Ambulatory Visit: Payer: Self-pay

## 2020-04-13 DIAGNOSIS — I10 Essential (primary) hypertension: Secondary | ICD-10-CM | POA: Insufficient documentation

## 2020-04-13 DIAGNOSIS — E1165 Type 2 diabetes mellitus with hyperglycemia: Secondary | ICD-10-CM | POA: Diagnosis not present

## 2020-04-13 DIAGNOSIS — Z96653 Presence of artificial knee joint, bilateral: Secondary | ICD-10-CM | POA: Diagnosis not present

## 2020-04-13 DIAGNOSIS — Z7982 Long term (current) use of aspirin: Secondary | ICD-10-CM | POA: Insufficient documentation

## 2020-04-13 DIAGNOSIS — Z79899 Other long term (current) drug therapy: Secondary | ICD-10-CM | POA: Insufficient documentation

## 2020-04-13 DIAGNOSIS — Z7984 Long term (current) use of oral hypoglycemic drugs: Secondary | ICD-10-CM | POA: Diagnosis not present

## 2020-04-13 DIAGNOSIS — M5442 Lumbago with sciatica, left side: Secondary | ICD-10-CM | POA: Insufficient documentation

## 2020-04-13 DIAGNOSIS — X500XXA Overexertion from strenuous movement or load, initial encounter: Secondary | ICD-10-CM | POA: Diagnosis not present

## 2020-04-13 DIAGNOSIS — M25552 Pain in left hip: Secondary | ICD-10-CM | POA: Diagnosis not present

## 2020-04-13 DIAGNOSIS — Z96642 Presence of left artificial hip joint: Secondary | ICD-10-CM | POA: Diagnosis not present

## 2020-04-13 DIAGNOSIS — M545 Low back pain, unspecified: Secondary | ICD-10-CM | POA: Diagnosis not present

## 2020-04-13 DIAGNOSIS — Z471 Aftercare following joint replacement surgery: Secondary | ICD-10-CM | POA: Diagnosis not present

## 2020-04-13 MED ORDER — HYDROCODONE-ACETAMINOPHEN 5-325 MG PO TABS: 1.0000 | ORAL_TABLET | Freq: Four times a day (QID) | ORAL | 0 refills | Status: DC | PRN

## 2020-04-13 NOTE — ED Triage Notes (Signed)
Patient c/o left lower back and hip pain radiating down leg x2 days. Denies injury. States she has been moving a lot putting up [employer]. Denies loss of bowel or bladder. Ambulatory.

## 2020-04-13 NOTE — Discharge Instructions (Signed)
Please return for any problem.  °

## 2020-04-13 NOTE — ED Provider Notes (Signed)
[place] DEPT Provider Note   CSN: [id number] Arrival date & time: 04/13/20  1413     History Chief Complaint  Patient presents with  . Back Pain  . Hip Pain    Misty Cervantes is a 75 y.o. female.  75 year old female with prior medical history detailed below presents for evaluation.  Patient reports that she has been lifting lots of boxes while packing up Christmas decorations for the last several days.  She complains of left-sided low back pain.  This is secondary to lifting.  She reports the pain starts in the left low back and radiates to the left buttock and into the left upper leg.  She denies weakness to the left lower extremity.  She denies other injury.  She denies fever.  She denies urinary or bowel movement change.  Patient is ambulatory without difficulty.  Patient has taken Tylenol at home with some improvement in her symptoms.  She reports increased pain at night.  She requests additional pain medicine for nighttime.  The history is provided by the patient and medical records.  Back Pain Location:  Lumbar spine Quality:  Aching Radiates to:  L thigh Pain severity:  Mild Pain is:  Worse during the night Onset quality:  Gradual Duration:  3 days Timing:  Intermittent Progression:  Waxing and waning Chronicity:  New Context: lifting heavy objects        Past Medical History:  Diagnosis Date  . Anemia   . Cataract   . Diabetes mellitus without complication ([place])   . Gastric ulcer 11/17/2011  . History of total left hip replacement 2011  . HTN (hypertension)   . Hx of total knee replacement 2017   11-03-2015   . Hyperlipemia   . Hyperplastic polyp of intestine 11/17/2011  . Hypertension   . Measles   . Menopause   . Migraine   . Osteoarthritis     Patient Active Problem List   Diagnosis Date Noted  . Aortic atherosclerosis ([place]) 06/22/2019  . Type 2 diabetes mellitus with hyperglycemia, without long-term current  use of insulin ([place]) 06/18/2016  . OA (osteoarthritis) of knee 11/03/2015  . Hyperlipidemia associated with type 2 diabetes mellitus ([place]) 07/23/2014  . Genu valgum 08/11/2012  . Hypertension associated with diabetes ([place]) 02/25/2010    Past Surgical History:  Procedure Laterality Date  . CARPAL TUNNEL RELEASE Right   . CATARACT EXTRACTION Bilateral   . HIP ARTHROPLASTY Left 2011  . JOINT REPLACEMENT    . RETINAL DETACHMENT SURGERY    . TOTAL KNEE ARTHROPLASTY Right 11/03/2015   Procedure: RIGHT TOTAL KNEE ARTHROPLASTY;  Surgeon: Gaynelle Arabian, MD;  Location: [facility];  Service: Orthopedics;  Laterality: Right;  . TOTAL VAGINAL HYSTERECTOMY  2012   TVH, anterior repair, TVT (no mesh used for the anterior repair)     OB History    Gravida  2   Para  2   Term  2   Preterm      AB      Living  1     SAB      IAB      Ectopic      Multiple      Live Births  2           Family History  Problem Relation Age of Onset  . Diabetes Mother        deceased  . Heart attack Mother   . Diabetes Sister  x2  . Kidney cancer Father   . Heart attack Father   . Diabetes Sister     Social History   Tobacco Use  . Smoking status: Never Smoker  . Smokeless tobacco: Never Used  Vaping Use  . Vaping Use: Never used  Substance Use Topics  . Alcohol use: No  . Drug use: No    Home Medications Prior to Admission medications   Medication Sig Start Date End Date Taking? Authorizing Provider  HYDROcodone-acetaminophen (NORCO/VICODIN) 5-325 MG tablet Take 1 tablet by mouth every 6 (six) hours as needed for severe pain. 04/13/20  Yes Valarie Merino, MD  Accu-Chek FastClix Lancets MISC Check blood sugars 1-2 times per day. 01/25/19   Orma Flaming, MD  acetaminophen (TYLENOL) 325 MG tablet Take 650 mg by mouth as needed.    [provider]  albuterol (VENTOLIN HFA) 108 (90 Base) MCG/ACT inhaler TAKE 2 PUFFS BY MOUTH EVERY 6 HOURS AS NEEDED FOR WHEEZE OR  SHORTNESS OF BREATH 12/09/19   Orma Flaming, MD  aspirin EC 81 MG tablet Take 81 mg by mouth daily.    [provider]  Cyanocobalamin (B-12 PO) Take 1 tablet by mouth daily.    [provider]  fluticasone (FLONASE) 50 MCG/ACT nasal spray PLACE 1 SPRAY INTO BOTH NOSTRILS 2 (TWO) TIMES DAILY AS NEEDED FOR ALLERGIES OR RHINITIS. 04/07/20   Orma Flaming, MD  glipiZIDE (GLUCOTROL XL) 2.5 MG 24 hr tablet Take 1 tablet (2.5 mg total) by mouth daily with breakfast. 04/11/20   Orma Flaming, MD  glucose blood (ACCU-CHEK GUIDE) test strip TEST FOUR TIMES DAILY. 11/23/19   Orma Flaming, MD  meclizine (ANTIVERT) 25 MG tablet TAKE 1 TABLET (25 MG TOTAL) BY MOUTH 3 (THREE) TIMES DAILY AS NEEDED FOR DIZZINESS. 12/31/19   Orma Flaming, MD  Multiple Vitamin (MULTIVITAMIN WITH MINERALS) TABS tablet Take 1 tablet by mouth daily.    [provider]  NONFORMULARY OR COMPOUNDED ITEM Estradiol cream 0.02%, use 1/2 a gram vaginally 2 x a week at hs. 3 month supply sent, 3 refills 07/10/19   Salvadore Dom, MD  pravastatin (PRAVACHOL) 40 MG tablet TAKE 1 TABLET BY MOUTH EVERY DAY 04/07/20   Orma Flaming, MD  traZODone (DESYREL) 50 MG tablet TAKE 1/2 TO 1 TABLET BY MOUTH AT BEDTIME AS NEEDED FOR SLEEP 10/15/19   Orma Flaming, MD  triamcinolone cream (KENALOG) 0.1 % Apply 1 application topically 4 (four) times daily as needed (bug bites). 06/19/19   [provider]  triamterene-hydrochlorothiazide (DYAZIDE) 37.5-25 MG capsule Take 1 each (1 capsule total) by mouth daily. 01/30/20   Orma Flaming, MD    Allergies    Probiotic [acidophilus]  Review of Systems   Review of Systems  Musculoskeletal: Positive for back pain.  All other systems reviewed and are negative.   Physical Exam Updated Vital Signs BP (!) 186/106 (BP Location: Left Arm)   Pulse (!) 117   Temp 98 F (36.7 C) (Oral)   Resp 16   LMP  (LMP Unknown)   SpO2 97%   Physical Exam Vitals and nursing note  reviewed.  Constitutional:      General: She is not in acute distress.    Appearance: Normal appearance. She is well-developed and well-nourished.  HENT:     Head: Normocephalic and atraumatic.     Mouth/Throat:     Mouth: Oropharynx is clear and moist.  Eyes:     Extraocular Movements: EOM normal.  Conjunctiva/sclera: Conjunctivae normal.     Pupils: Pupils are equal, round, and reactive to light.  Cardiovascular:     Rate and Rhythm: Normal rate and regular rhythm.     Heart sounds: Normal heart sounds.  Pulmonary:     Effort: Pulmonary effort is normal. No respiratory distress.     Breath sounds: Normal breath sounds.  Abdominal:     General: There is no distension.     Palpations: Abdomen is soft.     Tenderness: There is no abdominal tenderness.  Musculoskeletal:        General: No deformity or edema. Normal range of motion.     Cervical back: Normal range of motion and neck supple.     Comments: Mild diffuse tenderness overlying the left low back extending into the left buttock - no midline tenderness   5/5 strength BLE - normal gait.   Skin:    General: Skin is warm and dry.  Neurological:     Mental Status: She is alert and oriented to person, place, and time.  Psychiatric:        Mood and Affect: Mood and affect normal.     ED Results / Procedures / Treatments   Labs (all labs ordered are listed, but only abnormal results are displayed) Labs Reviewed - No data to display  EKG None  Radiology DG Lumbar Spine 2-3 Views  Result Date: 04/13/2020 CLINICAL DATA:  Pain EXAM: LUMBAR SPINE - 2-3 VIEW COMPARISON:  CT lumbar spine dated 06/20/2019 FINDINGS: Advanced degenerative changes are noted throughout the visualized thoracolumbar spine. There is a degenerative grade 1 anterolisthesis of L4 on L5. There is no acute compression fracture. Advanced facet arthrosis is noted throughout the lower lumbar segments. There are atherosclerotic changes throughout the  visualized abdominal aorta. The visualized bowel gas pattern is nonobstructive. IMPRESSION: Advanced multilevel degenerative changes without acute osseous abnormality. Electronically Signed   By: Constance Holster M.D.   On: 04/13/2020 15:56   DG Hip Unilat W or Wo Pelvis 2-3 Views Left  Result Date: 04/13/2020 CLINICAL DATA:  Low back pain radiating to LEFT hip for 3 days. EXAM: DG HIP (WITH OR WITHOUT PELVIS) 2-3V LEFT COMPARISON:  Plain film of the pelvis and LEFT hip dated 06/20/2019. FINDINGS: LEFT hip arthroplasty hardware appears intact and stable in alignment. No evidence of hardware loosening. Osseous structures of the pelvis appear intact and stable in alignment. No acute appearing osseous abnormality. Soft tissues about the pelvis and LEFT hip are unremarkable. IMPRESSION: No acute findings. LEFT hip arthroplasty hardware appears intact and stable in alignment. Electronically Signed   By: Franki Cabot M.D.   On: 04/13/2020 15:53    Procedures Procedures   Medications Ordered in ED Medications - No data to display  ED Course  I have reviewed the triage vital signs and the nursing notes.  Pertinent labs & imaging results that were available during my care of the patient were reviewed by me and considered in my medical decision making (see chart for details).    MDM Rules/Calculators/A&P                          MDM  Screen complete  LATONDRA MAGEL was evaluated in Emergency Department on 04/13/2020 for the symptoms described in the history of present illness. She was evaluated in the context of the global COVID-19 pandemic, which necessitated consideration that the patient might be at risk for infection with the  SARS-CoV-2 virus that causes COVID-19. Institutional protocols and algorithms that pertain to the evaluation of patients at risk for COVID-19 are in a state of rapid change based on information released by regulatory bodies including the CDC and federal and state  organizations. These policies and algorithms were followed during the patient's care in the ED.  Patient is presenting for evaluation of left-sided low back discomfort that began after lifting heavy boxes earlier this week.  Patient is neurologically intact.  Imaging did not reveal significant acute abnormality.  Patient without red flags on exam (weakness, loss of tone, abnormal gait).   Patient understands need for close FU with her PMD and also with her primary Orthopedic.  Strict return precautions given and understood - return for increased pain, fever, weakness, etc.       Final Clinical Impression(s) / ED Diagnoses Final diagnoses:  Acute left-sided low back pain with left-sided sciatica    Rx / DC Orders ED Discharge Orders         Ordered    HYDROcodone-acetaminophen (NORCO/VICODIN) 5-325 MG tablet  Every 6 hours PRN        04/13/20 1611           Valarie Merino, MD 04/13/20 1628

## 2020-04-16 ENCOUNTER — Telehealth: Payer: Self-pay

## 2020-04-16 NOTE — Telephone Encounter (Signed)
Call to patient. Per DPR, OK to leave message on voicemail.   Left voicemail requesting a return call to [facility] to review benefits and schedule recommended colpocleisis surgery with Sumner Boast, MD.

## 2020-04-16 NOTE — Telephone Encounter (Signed)
It is up to her.

## 2020-04-16 NOTE — Telephone Encounter (Signed)
Spoke with patient regarding surgery benefits. Patient acknowledges understanding of information presented. Patient is aware that benefits presented are for professional benefits only. Patient is aware that once surgery is scheduled, the hospital will call with separate benefits.   Patient stated that she was seen in the ED on Sunday, 04/13/2020 and was diagnosed with a pinched nerve in her back. Patient stated that she would like to put off proceeding with scheduling surgery until back pain is resolved, only if Dr. Talbert Nan believes it is OK to wait. Patient stated that she sees an orthopedist this week and her primary care physician tomorrow regarding the pinched nerve.  Routing to Glorianne Manchester, RN, regarding scheduling and Dr. Talbert Nan regarding advice on postponing surgery scheduling.

## 2020-04-17 ENCOUNTER — Encounter: Payer: Self-pay | Admitting: Family Medicine

## 2020-04-17 ENCOUNTER — Other Ambulatory Visit: Payer: Self-pay

## 2020-04-17 ENCOUNTER — Ambulatory Visit (INDEPENDENT_AMBULATORY_CARE_PROVIDER_SITE_OTHER): Payer: Medicare Other | Admitting: Family Medicine

## 2020-04-17 VITALS — BP 130/80 | HR 80 | Temp 97.0°F | Ht 60.0 in | Wt 141.0 lb

## 2020-04-17 DIAGNOSIS — M5416 Radiculopathy, lumbar region: Secondary | ICD-10-CM | POA: Diagnosis not present

## 2020-04-17 MED ORDER — HYDROCODONE-ACETAMINOPHEN 5-325 MG PO TABS: 1.0000 | ORAL_TABLET | ORAL | 0 refills | Status: DC | PRN

## 2020-04-17 MED ORDER — PREDNISONE 20 MG PO TABS: 40.0000 mg | ORAL_TABLET | Freq: Every day | ORAL | 0 refills | Status: DC

## 2020-04-17 MED ORDER — GABAPENTIN 300 MG PO CAPS: 300.0000 mg | ORAL_CAPSULE | Freq: Three times a day (TID) | ORAL | 0 refills | Status: DC

## 2020-04-17 NOTE — Progress Notes (Signed)
Patient: Misty Cervantes MRN: [id number] DOB: 1945-11-23 PCP: Orma Flaming, MD     Subjective:  Chief Complaint  Patient presents with  . Follow-up  . Back Pain  . Hip Pain    HPI: The patient is a 75 y.o. female who presents today for ED follow up for left low back and hip pain. She states pain started late Thursday and worsening on Friday.  She was seen in ER on 04/13/20. She thinks her back pain was caused by lifting christmas boxes, but her pain didn't start after lifting boxes, it just recently started a week a ago. Pain is in her left lower back and hip and radiates down her left leg to her ankle. Pain is rated as a 9.5/10 and keeps her from sleeping. She has some tingling down the back of her leg. She has used a heating pad and ice and rotates them. Sometimes it helps and sometimes it doesn't. She was given norco in ER and this has not helped at all. She has no numbness in her left leg, but feels like it may be weaker when walking.   She had her pessary removed last week and it hasn't helped her pain at all.    Left hip xray: no acute findings.   Lumbar spine: advanced multilevel degenerative changes without acute osseous abnormality. incidental finding of aortic arthrosclerosis.   Review of Systems  Constitutional: Negative for diaphoresis, fatigue and fever.  Musculoskeletal: Positive for back pain and gait problem. Negative for joint swelling and myalgias.  Skin: Negative for rash.    Allergies Patient is allergic to probiotic [acidophilus].  Past Medical History Patient  has a past medical history of Anemia, Cataract, Diabetes mellitus without complication ([place]), Gastric ulcer (11/17/2011), History of total left hip replacement (2011), HTN (hypertension), total knee replacement (2017), Hyperlipemia, Hyperplastic polyp of intestine (11/17/2011), Hypertension, Measles, Menopause, Migraine, and Osteoarthritis.  Surgical History Patient  has a past surgical history that  includes Total vaginal hysterectomy (2012); Cataract extraction (Bilateral); Retinal detachment surgery; Hip Arthroplasty (Left, 2011); Joint replacement; Total knee arthroplasty (Right, 11/03/2015); and Carpal tunnel release (Right).  Family History Pateint's family history includes Diabetes in her mother, sister, and sister; Heart attack in her father and mother; Kidney cancer in her father.  Social History Patient  reports that she has never smoked. She has never used smokeless tobacco. She reports that she does not drink alcohol and does not use drugs.    Objective: Vitals:   04/17/20 0807  BP: 130/80  Pulse: 80  Temp: (!) 97 F (36.1 C)  TempSrc: Temporal  SpO2: 97%  Weight: 141 lb (64 kg)  Height: 5' (1.524 m)    Body mass index is 27.54 kg/m.  Physical Exam Vitals reviewed.  Constitutional:      Appearance: Normal appearance. She is obese. She is not ill-appearing.  HENT:     Head: Normocephalic and atraumatic.  Cardiovascular:     Rate and Rhythm: Normal rate and regular rhythm.  Pulmonary:     Effort: Pulmonary effort is normal.  Musculoskeletal:        General: Tenderness present.     Comments: She has markedly decreased strength in LLE. Right 4/5 and left lower extremity 2/5. Sensation intact. +straight leg test on left.   Neurological:     General: No focal deficit present.     Mental Status: She is alert and oriented to person, place, and time.     Sensory: No sensory deficit.  Motor: Weakness present.     Gait: Gait normal.     Deep Tendon Reflexes: Reflexes normal.        Assessment/plan: 1. Lumbar radiculopathy, acute -radicular findings and weakness on exam with + straight leg test needs MRI. This was ordered -PT ordered, would like to get MRI back first before she starts this.  -treating for sciatica with gabapentin and steroid burst.  -#15 norco given for severe pain only. Continue tylenol prn and heat.  -ER precautions given for any fever,  foot drop, loss of sensation on buttocks or urinary incontinence. (changed from baseline).   - HYDROcodone-acetaminophen (NORCO/VICODIN) 5-325 MG tablet; Take 1 tablet by mouth every 4 (four) hours as needed for severe pain.  Dispense: 15 tablet; Refill: 0 - MR Lumbar Spine Wo Contrast; Future - Ambulatory referral to Physical Therapy   This visit occurred during the SARS-CoV-2 public health emergency.  Safety protocols were in place, including screening questions prior to the visit, additional usage of staff PPE, and extensive cleaning of exam room while observing appropriate contact time as indicated for disinfecting solutions.     Return if symptoms worsen or fail to improve.   Orma Flaming, MD [place]   04/17/2020

## 2020-04-17 NOTE — Patient Instructions (Signed)
1) you have some findings on exam that warrant a MRI. I have ordered this for you and they will call you to set this up.   2) referral to Physical therapy  3) im sending in a steroid burst for 5 days to help inflammation as you have some findings consistent with sciatica. Also sending in gapapentin for you to take AS Needed up to three times a day.   4) continue ibuprofen and mortrin as needed and heat.   5) sent in 15 pills of the narcotic pain pill.   Any urinary incontinence, loss of sensation around buttocks, fever go to ER.     Sciatica  Sciatica is pain, weakness, tingling, or loss of feeling (numbness) along the sciatic nerve. The sciatic nerve starts in the lower back and goes down the back of each leg. Sciatica usually goes away on its own or with treatment. Sometimes, sciatica may come back (recur). What are the causes? This condition happens when the sciatic nerve is pinched or has pressure put on it. This may be the result of:  A disk in between the bones of the spine bulging out too far (herniated disk).  Changes in the spinal disks that occur with aging.  A condition that affects a muscle in the butt.  Extra bone growth near the sciatic nerve.  A break (fracture) of the area between your hip bones (pelvis).  Pregnancy.  Tumor. This is rare. What increases the risk? You are more likely to develop this condition if you:  Play sports that put pressure or stress on the spine.  Have poor strength and ease of movement (flexibility).  Have had a back injury in the past.  Have had back surgery.  Sit for long periods of time.  Do activities that involve bending or lifting over and over again.  Are very overweight (obese). What are the signs or symptoms? Symptoms can vary from mild to very bad. They may include:  Any of these problems in the lower back, leg, hip, or butt: ? Mild tingling, loss of feeling, or dull aches. ? Burning sensations. ? Sharp  pains.  Loss of feeling in the back of the calf or the sole of the foot.  Leg weakness.  Very bad back pain that makes it hard to move. These symptoms may get worse when you cough, sneeze, or laugh. They may also get worse when you sit or stand for long periods of time. How is this treated? This condition often gets better without any treatment. However, treatment may include:  Changing or cutting back on physical activity when you have pain.  Doing exercises and stretching.  Putting ice or heat on the affected area.  Medicines that help: ? To relieve pain and swelling. ? To relax your muscles.  Shots (injections) of medicines that help to relieve pain, irritation, and swelling.  Surgery. Follow these instructions at home: Medicines  Take over-the-counter and prescription medicines only as told by your doctor.  Ask your doctor if the medicine prescribed to you: ? Requires you to avoid driving or using heavy machinery. ? Can cause trouble pooping (constipation). You may need to take these steps to prevent or treat trouble pooping:  Drink enough fluids to keep your pee (urine) pale yellow.  Take over-the-counter or prescription medicines.  Eat foods that are high in fiber. These include beans, whole grains, and fresh fruits and vegetables.  Limit foods that are high in fat and sugar. These include fried or  sweet foods. Managing pain  If told, put ice on the affected area. ? Put ice in a plastic bag. ? Place a towel between your skin and the bag. ? Leave the ice on for 20 minutes, 2-3 times a day.  If told, put heat on the affected area. Use the heat source that your doctor tells you to use, such as a moist heat pack or a heating pad. ? Place a towel between your skin and the heat source. ? Leave the heat on for 20-30 minutes. ? Remove the heat if your skin turns bright red. This is very important if you are unable to feel pain, heat, or cold. You may have a greater  risk of getting burned.      Activity  Return to your normal activities as told by your doctor. Ask your doctor what activities are safe for you.  Avoid activities that make your symptoms worse.  Take short rests during the day. ? When you rest for a long time, do some physical activity or stretching between periods of rest. ? Avoid sitting for a long time without moving. Get up and move around at least one time each hour.  Exercise and stretch regularly, as told by your doctor.  Do not lift anything that is heavier than 10 lb (4.5 kg) while you have symptoms of sciatica. ? Avoid lifting heavy things even when you do not have symptoms. ? Avoid lifting heavy things over and over.  When you lift objects, always lift in a way that is safe for your body. To do this, you should: ? Bend your knees. ? Keep the object close to your body. ? Avoid twisting.   General instructions  Stay at a healthy weight.  Wear comfortable shoes that support your feet. Avoid wearing high heels.  Avoid sleeping on a mattress that is too soft or too hard. You might have less pain if you sleep on a mattress that is firm enough to support your back.  Keep all follow-up visits as told by your doctor. This is important. Contact a doctor if:  You have pain that: ? Wakes you up when you are sleeping. ? Gets worse when you lie down. ? Is worse than the pain you have had in the past. ? Lasts longer than 4 weeks.  You lose weight without trying. Get help right away if:  You cannot control when you pee (urinate) or poop (have a bowel movement).  You have weakness in any of these areas and it gets worse: ? Lower back. ? The area between your hip bones. ? Butt. ? Legs.  You have redness or swelling of your back.  You have a burning feeling when you pee. Summary  Sciatica is pain, weakness, tingling, or loss of feeling (numbness) along the sciatic nerve.  This condition happens when the sciatic  nerve is pinched or has pressure put on it.  Sciatica can cause pain, tingling, or loss of feeling (numbness) in the lower back, legs, hips, and butt.  Treatment often includes rest, exercise, medicines, and putting ice or heat on the affected area. This information is not intended to replace advice given to you by your health care provider. Make sure you discuss any questions you have with your health care provider. Document Revised: 03/20/2018 Document Reviewed: 03/20/2018 Elsevier Patient Education  [place].

## 2020-04-23 ENCOUNTER — Telehealth: Payer: Self-pay

## 2020-04-23 DIAGNOSIS — M5416 Radiculopathy, lumbar region: Secondary | ICD-10-CM

## 2020-04-23 NOTE — Telephone Encounter (Signed)
MEDICATION: HYDROcodone-acetaminophen (NORCO/VICODIN) 5-325 MG tablet  PHARMACY: [facility] [place]: Please contact patient once refill is sent in.   **Let patient know to contact pharmacy at the end of the day to make sure medication is ready. **  ** Please notify patient to allow 48-72 hours to process**  **Encourage patient to contact the pharmacy for refills or they can request refills through [facility]**

## 2020-04-24 ENCOUNTER — Ambulatory Visit: Payer: Medicare Other | Admitting: Physical Therapy

## 2020-04-24 MED ORDER — HYDROCODONE-ACETAMINOPHEN 5-325 MG PO TABS: 1.0000 | ORAL_TABLET | ORAL | 0 refills | Status: DC | PRN

## 2020-04-24 NOTE — Telephone Encounter (Signed)
Sent in 15 pills again. She has her MRI on 2/19. Hopefully sports med can also offer some relief.  Orma Flaming, MD [place]

## 2020-04-24 NOTE — Telephone Encounter (Signed)
I have spoken to patient in regard.  Informed patient that Dr. Rogers Blocker was concerned about her intake of this med.   I did inform her that Dr. Eliberto Ivory would call her in another small script.    Patient has cancelled PT appt for today due to being afraid pain will increase.    I have transferred patient over to [facility] to get scheduled.

## 2020-04-24 NOTE — Telephone Encounter (Signed)
Patient is calling in stating she is completely out of medication.

## 2020-04-24 NOTE — Telephone Encounter (Signed)
FYI

## 2020-04-24 NOTE — Telephone Encounter (Signed)
Lvm to make pt aware of rx refill send on 04/17/2020.

## 2020-04-25 NOTE — Telephone Encounter (Signed)
Spoke with patient.  Patient request to deffer surgery until back pain is resolved.  She has a MRI scheduled for 05/03/20.  She will see Dr. Talbert Nan for f/u on 2/23, will plan to further discuss at that time.  Questions answered.   Will hold surgery chart for f/u.    Routing to provider for final review. Patient is agreeable to disposition. Will close encounter.  CC: Hayley Carder

## 2020-04-28 NOTE — Progress Notes (Unsigned)
Subjective:   I, Misty Cervantes, LAT, ATC, am serving as scribe for Dr. Lynne Leader.  I'm seeing this patient as a consultation for Dr. Joya Gaskins. Note will be routed back to referring provider/PCP.  CC: Low back pain  HPI: Pt is a 75 y/o female c/o low back pain ongoing since late-January that she feels occurred after lifting some boxes. Pt was seen by PCP on 04/17/20 and was referred to PT and for an L-spine MRI that is scheduled for 05/03/20. Prior to that, pt was seen at the [facility] ED on 04/13/20 w/ c/o L-sided low back pain. Today, pt reports L-side low back/glute pain that radiates into her L post-lat LE to her ankle.  Pt has a hx of a L THA and a R TKA.  Radiating pn: yes- L buttock and L upper thigh LE numbness/tingling: yes in her L LE to her ankle LE weakness: yes Aggravates: increased pain at night; walking Rx tried: Tylenol, hydrocodone-acetaminophen; heat; ice  Dx imaging: scheduled 05/03/20 L-spine MRI   04/13/20 L-spine XR & L hip  Past medical history, Surgical history, Family history, Social history, Allergies, and medications have been entered into the medical record, reviewed.   Review of Systems: No new headache, visual changes, nausea, vomiting, diarrhea, constipation, dizziness, abdominal pain, skin rash, fevers, chills, night sweats, weight loss, swollen lymph nodes, body aches, joint swelling, muscle aches, chest pain, shortness of breath, mood changes, visual or auditory hallucinations.   Objective:    Vitals:   04/29/20 1257  BP: (!) 150/92  Pulse: 90  SpO2: 96%   General: Well Developed, well nourished, and in no acute distress.  Neuro/Psych: Alert and oriented x3, extra-ocular muscles intact, able to move all 4 extremities, sensation grossly intact. Skin: Warm and dry, no rashes noted.  Respiratory: Not using accessory muscles, speaking in full sentences, trachea midline.  Cardiovascular: Pulses palpable, no extremity edema. Abdomen: Does not appear  distended. MSK: L-spine normal-appearing Nontender midline. Decreased lumbar motion.  Exhalatory strength reflexes and sensation are equal normal throughout. Positive left-sided slump test.  Lab and Radiology Results DG Lumbar Spine 2-3 Views  Result Date: 04/13/2020 CLINICAL DATA:  Pain EXAM: LUMBAR SPINE - 2-3 VIEW COMPARISON:  CT lumbar spine dated 06/20/2019 FINDINGS: Advanced degenerative changes are noted throughout the visualized thoracolumbar spine. There is a degenerative grade 1 anterolisthesis of L4 on L5. There is no acute compression fracture. Advanced facet arthrosis is noted throughout the lower lumbar segments. There are atherosclerotic changes throughout the visualized abdominal aorta. The visualized bowel gas pattern is nonobstructive. IMPRESSION: Advanced multilevel degenerative changes without acute osseous abnormality. Electronically Signed   By: Constance Holster M.D.   On: 04/13/2020 15:56   DG Hip Unilat W or Wo Pelvis 2-3 Views Left  Result Date: 04/13/2020 CLINICAL DATA:  Low back pain radiating to LEFT hip for 3 days. EXAM: DG HIP (WITH OR WITHOUT PELVIS) 2-3V LEFT COMPARISON:  Plain film of the pelvis and LEFT hip dated 06/20/2019. FINDINGS: LEFT hip arthroplasty hardware appears intact and stable in alignment. No evidence of hardware loosening. Osseous structures of the pelvis appear intact and stable in alignment. No acute appearing osseous abnormality. Soft tissues about the pelvis and LEFT hip are unremarkable. IMPRESSION: No acute findings. LEFT hip arthroplasty hardware appears intact and stable in alignment. Electronically Signed   By: Franki Cabot M.D.   On: 04/13/2020 15:53   I, Lynne Leader, personally (independently) visualized and performed the interpretation of the images  attached in this note.   Impression and Recommendations:    Assessment and Plan: 75 y.o. female with left S1 radiculopathy.  Failing conservative management with prednisone gabapentin  and hydrocodone.  MRI already ordered and scheduled for February 19.  Anticipate epidural steroid injection will follow after MRI results.  Spent time today discussing likely diagnosis and epidural steroid injection plan.  We will have patient return to clinic on Tuesday, February 22 to review the MRI and discuss plan.    PDMP not reviewed this encounter. Orders Placed This Encounter  Procedures  . Ambulatory referral to Physical Therapy    Referral Priority:   Routine    Referral Type:   Physical Medicine    Referral Reason:   Specialty Services Required    Requested Specialty:   Physical Therapy   No orders of the defined types were placed in this encounter.   Discussed warning signs or symptoms. Please see discharge instructions. Patient expresses understanding.   The above documentation has been reviewed and is accurate and complete Lynne Leader, M.D.

## 2020-04-29 ENCOUNTER — Ambulatory Visit: Payer: Medicare Other | Admitting: Family Medicine

## 2020-04-29 ENCOUNTER — Encounter: Payer: Self-pay | Admitting: Family Medicine

## 2020-04-29 ENCOUNTER — Other Ambulatory Visit: Payer: Self-pay

## 2020-04-29 VITALS — BP 150/92 | HR 90 | Ht 60.0 in | Wt 144.8 lb

## 2020-04-29 DIAGNOSIS — M5432 Sciatica, left side: Secondary | ICD-10-CM | POA: Diagnosis not present

## 2020-04-29 NOTE — Patient Instructions (Addendum)
Thank you for coming in today.  Let me know when the MRI is done.  I think we will do the back injections.   Recheck with me next week  (Tuesday)  STOP aspirin now.

## 2020-05-02 NOTE — Telephone Encounter (Signed)
FYI

## 2020-05-03 ENCOUNTER — Other Ambulatory Visit: Payer: Self-pay

## 2020-05-03 ENCOUNTER — Ambulatory Visit
Admission: RE | Admit: 2020-05-03 | Discharge: 2020-05-03 | Disposition: A | Discharge status: Home / Self Care | Payer: Medicare Other | Source: Ambulatory Visit | Attending: Family Medicine | Admitting: Family Medicine

## 2020-05-03 DIAGNOSIS — M48061 Spinal stenosis, lumbar region without neurogenic claudication: Secondary | ICD-10-CM | POA: Diagnosis not present

## 2020-05-03 DIAGNOSIS — M545 Low back pain, unspecified: Secondary | ICD-10-CM | POA: Diagnosis not present

## 2020-05-03 DIAGNOSIS — M5416 Radiculopathy, lumbar region: Secondary | ICD-10-CM

## 2020-05-05 ENCOUNTER — Encounter: Payer: Self-pay | Admitting: Family Medicine

## 2020-05-06 ENCOUNTER — Telehealth: Payer: Self-pay | Admitting: Family Medicine

## 2020-05-06 ENCOUNTER — Ambulatory Visit: Payer: Medicare Other | Admitting: Family Medicine

## 2020-05-06 DIAGNOSIS — M5432 Sciatica, left side: Secondary | ICD-10-CM

## 2020-05-06 NOTE — Telephone Encounter (Signed)
Epidural steroid injection ordered 

## 2020-05-06 NOTE — Progress Notes (Signed)
MRI of the lumbar spine shows arthritis changes that could cause nerve compression especially at the S1 nerve root which would be consistent with the pain you are experiencing.  I have already ordered an epidural steroid injection.  Please contact [facility] to schedule.  Phone number is [phone].   If you would like to have a discussion with me in clinic regarding the MRI findings and treatment plan please schedule.

## 2020-05-07 ENCOUNTER — Other Ambulatory Visit: Payer: Self-pay

## 2020-05-07 ENCOUNTER — Ambulatory Visit: Payer: Medicare Other | Admitting: Obstetrics and Gynecology

## 2020-05-07 ENCOUNTER — Encounter: Payer: Self-pay | Admitting: Obstetrics and Gynecology

## 2020-05-07 VITALS — BP 122/68 | HR 77 | Ht 60.0 in | Wt 146.0 lb

## 2020-05-07 DIAGNOSIS — N952 Postmenopausal atrophic vaginitis: Secondary | ICD-10-CM | POA: Diagnosis not present

## 2020-05-07 DIAGNOSIS — N819 Female genital prolapse, unspecified: Secondary | ICD-10-CM | POA: Diagnosis not present

## 2020-05-07 DIAGNOSIS — Z4689 Encounter for fitting and adjustment of other specified devices: Secondary | ICD-10-CM

## 2020-05-07 NOTE — Progress Notes (Signed)
GYNECOLOGY  VISIT   HPI: 75 y.o.   Divorced White or Caucasian Not Hispanic or Latino  female   [phone] with No LMP recorded (lmp unknown). Patient has had a hysterectomy.   here for f/u prolapse and issues with pessary.     H/O grade 3 cystocele, recurring issues with vaginal irritation from her pessary. Using vaginal estrogen. After her pessary was out for a month she was noted to have a grade 3-4 vault prolapse.  Being scheduled for a colpocleisis, she needs to wait until her back issue is figured out.    A month ago she came in for pessary removal secondary to severe back/leg pain. The pain didn't go away with taking the pessary out. The pain was in her lower back and down her leg. Ultimately diagnosed with Sciatica. MR of her lumbar spine showed severe spinal stenosis and arthrosis. MD recommend getting a shot of cortisone, she is considering this.  She has been taking gabapentin. Got a steroid taper.   She replaced her prior ring pessary yesterday. Not able to put the cube in. Doesn't have a pessary in currently.    GYNECOLOGIC HISTORY: No LMP recorded (lmp unknown). Patient has had a hysterectomy. Contraception:PMP Menopausal hormone therapy: yes estradiol         OB History    Gravida  2   Para  2   Term  2   Preterm      AB      Living  1     SAB      IAB      Ectopic      Multiple      Live Births  2              Patient Active Problem List   Diagnosis Date Noted  . Aortic atherosclerosis ([place]) 06/22/2019  . Type 2 diabetes mellitus with hyperglycemia, without long-term current use of insulin ([place]) 06/18/2016  . OA (osteoarthritis) of knee 11/03/2015  . Hyperlipidemia associated with type 2 diabetes mellitus ([place]) 07/23/2014  . Genu valgum 08/11/2012  . Hypertension associated with diabetes ([place]) 02/25/2010    Past Medical History:  Diagnosis Date  . Anemia   . Cataract   . Diabetes mellitus without complication ([place])   . Gastric ulcer  11/17/2011  . History of total left hip replacement 2011  . HTN (hypertension)   . Hx of total knee replacement 2017   11-03-2015   . Hyperlipemia   . Hyperplastic polyp of intestine 11/17/2011  . Hypertension   . Measles   . Menopause   . Migraine   . Osteoarthritis     Past Surgical History:  Procedure Laterality Date  . CARPAL TUNNEL RELEASE Right   . CATARACT EXTRACTION Bilateral   . HIP ARTHROPLASTY Left 2011  . JOINT REPLACEMENT    . RETINAL DETACHMENT SURGERY    . TOTAL KNEE ARTHROPLASTY Right 11/03/2015   Procedure: RIGHT TOTAL KNEE ARTHROPLASTY;  Surgeon: Gaynelle Arabian, MD;  Location: [facility];  Service: Orthopedics;  Laterality: Right;  . TOTAL VAGINAL HYSTERECTOMY  2012   TVH, anterior repair, TVT (no mesh used for the anterior repair)    Current Outpatient Medications  Medication Sig Dispense Refill  . Accu-Chek FastClix Lancets MISC Check blood sugars 1-2 times per day. 100 each 2  . acetaminophen (TYLENOL) 325 MG tablet Take 650 mg by mouth as needed.    Marland Kitchen albuterol (VENTOLIN HFA) 108 (90 Base) MCG/ACT inhaler TAKE 2 PUFFS  BY MOUTH EVERY 6 HOURS AS NEEDED FOR WHEEZE OR SHORTNESS OF BREATH 6.7 each 1  . aspirin EC 81 MG tablet Take 81 mg by mouth daily.    . Cyanocobalamin (B-12 PO) Take 1 tablet by mouth daily.    . fluticasone (FLONASE) 50 MCG/ACT nasal spray PLACE 1 SPRAY INTO BOTH NOSTRILS 2 (TWO) TIMES DAILY AS NEEDED FOR ALLERGIES OR RHINITIS. 48 mL 0  . gabapentin (NEURONTIN) 300 MG capsule Take 1 capsule (300 mg total) by mouth 3 (three) times daily. 90 capsule 0  . glipiZIDE (GLUCOTROL XL) 2.5 MG 24 hr tablet Take 1 tablet (2.5 mg total) by mouth daily with breakfast. 90 tablet 1  . glucose blood (ACCU-CHEK GUIDE) test strip TEST FOUR TIMES DAILY. 100 each 6  . meclizine (ANTIVERT) 25 MG tablet TAKE 1 TABLET (25 MG TOTAL) BY MOUTH 3 (THREE) TIMES DAILY AS NEEDED FOR DIZZINESS. 60 tablet 1  . Multiple Vitamin (MULTIVITAMIN WITH MINERALS) TABS tablet Take 1  tablet by mouth daily.    . NONFORMULARY OR COMPOUNDED ITEM Estradiol cream 0.02%, use 1/2 a gram vaginally 2 x a week at hs. 3 month supply sent, 3 refills 3 each 1  . pravastatin (PRAVACHOL) 40 MG tablet TAKE 1 TABLET BY MOUTH EVERY DAY 90 tablet 0  . traZODone (DESYREL) 50 MG tablet TAKE 1/2 TO 1 TABLET BY MOUTH AT BEDTIME AS NEEDED FOR SLEEP 90 tablet 2  . triamcinolone cream (KENALOG) 0.1 % Apply 1 application topically 4 (four) times daily as needed (bug bites).    . triamterene-hydrochlorothiazide (DYAZIDE) 37.5-25 MG capsule Take 1 each (1 capsule total) by mouth daily. 90 capsule 3   No current facility-administered medications for this visit.     ALLERGIES: Probiotic [acidophilus]  Family History  Problem Relation Age of Onset  . Diabetes Mother        deceased  . Heart attack Mother   . Diabetes Sister        x2  . Kidney cancer Father   . Heart attack Father   . Diabetes Sister     Social History   Socioeconomic History  . Marital status: Divorced    Spouse name: Not on file  . Number of children: Not on file  . Years of education: Not on file  . Highest education level: Not on file  Occupational History  . Occupation: retired    Fish farm manager: RETIRED  Tobacco Use  . Smoking status: Never Smoker  . Smokeless tobacco: Never Used  Vaping Use  . Vaping Use: Never used  Substance and Sexual Activity  . Alcohol use: No  . Drug use: No  . Sexual activity: Not Currently    Partners: Male    Birth control/protection: Surgical    Comment: hysterectomy  Other Topics Concern  . Not on file  Social History Narrative   Divorced, 2 children; retired.    Custody of grandchildren    Social Determinants of Health   Financial Resource Strain: Not on file  Food Insecurity: Not on file  Transportation Needs: Not on file  Physical Activity: Not on file  Stress: Not on file  Social Connections: Not on file  Intimate Partner Violence: Not on file    Review of Systems   All other systems reviewed and are negative.   PHYSICAL EXAMINATION:    BP 122/68   Pulse 77   Ht 5' (1.524 m)   Wt 146 lb (66.2 kg)   LMP  (LMP Unknown)  SpO2 98%   BMI 28.51 kg/m     General appearance: alert, cooperative and appears stated age  Pelvic: External genitalia:  no lesions              Urethra:  normal appearing urethra with no masses, tenderness or lesions              Bartholins and Skenes: normal                 Vagina: atrophic appearing vagina, there is a small ulceration at the vaginal apex (from the vagina rubbing exteriorly, the pessary hasn't been in). Compounded vaginal estrogen placed intravaginally and the cube pessary was placed.               Cervix: absent                1. Vaginal vault prolapse Pessary replaced today (cube). Planning colpocleisis but wants to figure out the plan for her back pain first  2. Vaginal atrophy Using vaginal estrogen  3. Pessary maintenance F/u in one month

## 2020-05-08 ENCOUNTER — Ambulatory Visit: Payer: Medicare Other | Admitting: Physical Therapy

## 2020-05-12 ENCOUNTER — Ambulatory Visit: Payer: Medicare Other | Admitting: Family Medicine

## 2020-05-12 ENCOUNTER — Encounter: Payer: Self-pay | Admitting: Family Medicine

## 2020-05-12 ENCOUNTER — Other Ambulatory Visit: Payer: Self-pay

## 2020-05-12 VITALS — BP 140/80 | HR 100 | Ht 60.0 in | Wt 143.2 lb

## 2020-05-12 DIAGNOSIS — M48061 Spinal stenosis, lumbar region without neurogenic claudication: Secondary | ICD-10-CM

## 2020-05-12 DIAGNOSIS — M5432 Sciatica, left side: Secondary | ICD-10-CM

## 2020-05-12 NOTE — Progress Notes (Signed)
I, Wendy Poet, LAT, ATC, am serving as scribe for Dr. Lynne Leader.   Misty Cervantes is a 75 y.o. female who presents to [place] [facility] today for L-sided LBP secondary to L-sided sciatica ongoing since late-January that she feels occurred after lifting some boxes. Pt was last seen by Dr. Georgina Snell on 04/29/20 and was advised to proceed w/ MRI and planning for epidural steroid injection. She was also referred to PT at [facility] but has not had any visits.  Today, pt reports that she is feeling 100% better.  She states that she heard from PT but did not schedule as Dr. Rogers Blocker recommended that she not do PT until she got her MRI results.  Radiating pn: yes- L buttock and L upper thigh LE numbness/tingling: yes in her L LE to her ankle LE weakness: yes Aggravates: increased pain at night; walking Rx tried: Tylenol, hydrocodone-acetaminophen, heat, ice, gabapentin, prednisone  Dx imaging: 05/03/20 L-spine MRI              04/13/20 L-spine XR & L hip  Pertinent review of systems: No fevers or chills  Relevant historical information: Hypertension and diabetes   Exam:  BP 140/80 (BP Location: Right Arm, Patient Position: Sitting, Cuff Size: Normal)   Pulse 100   Ht 5' (1.524 m)   Wt 143 lb 3.2 oz (65 kg)   LMP  (LMP Unknown)   SpO2 98%   BMI 27.97 kg/m  General: Well Developed, well nourished, and in no acute distress.   MSK: L-spine normal-appearing nontender midline decreased lumbar motion to extension.  Lower extremity strength is intact.    Lab and Radiology Results  DG Lumbar Spine 2-3 Views  Result Date: 04/13/2020 CLINICAL DATA:  Pain EXAM: LUMBAR SPINE - 2-3 VIEW COMPARISON:  CT lumbar spine dated 06/20/2019 FINDINGS: Advanced degenerative changes are noted throughout the visualized thoracolumbar spine. There is a degenerative grade 1 anterolisthesis of L4 on L5. There is no acute compression fracture. Advanced facet arthrosis is noted throughout the lower  lumbar segments. There are atherosclerotic changes throughout the visualized abdominal aorta. The visualized bowel gas pattern is nonobstructive. IMPRESSION: Advanced multilevel degenerative changes without acute osseous abnormality. Electronically Signed   By: Constance Holster M.D.   On: 04/13/2020 15:56   MR Lumbar Spine Wo Contrast  Result Date: 05/04/2020 CLINICAL DATA:  Low back pain radiating to the left hip, buttock, and left leg. EXAM: MRI LUMBAR SPINE WITHOUT CONTRAST TECHNIQUE: Multiplanar, multisequence MR imaging of the lumbar spine was performed. No intravenous contrast was administered. COMPARISON:  Lumbar spine CT 06/20/2019 FINDINGS: Segmentation:  Lumbarized S1 with fully formed S1-2 disc. Alignment: Mild upper lumbar levoscoliosis and lower lumbar dextroscoliosis. Unchanged grade 1 anterolisthesis of L5 on S1 and S1 on S2. Vertebrae: No fracture. Moderate nonspecific bone marrow heterogeneity diffusely without a destructive osseous lesion identified. Conus medullaris and cauda equina: Conus extends to the L1-2 level. Conus and cauda equina appear normal. Paraspinal and other soft tissues: Unremarkable. Disc levels: Disc desiccation from L2-3 to S1-2. Mild disc space narrowing at L3-4 and L4-5 and moderate narrowing at L5-S1. L1-2: Only imaged sagittally. Minimal disc bulging without stenosis. L2-3: Minimal disc bulging and mild facet and ligamentum flavum hypertrophy without stenosis. L3-4: Circumferential disc bulging eccentric to the left and moderate facet and ligamentum flavum hypertrophy result in moderate spinal stenosis, mild right and moderate left lateral recess stenosis, and mild bilateral neural foraminal stenosis, stable to mildly progressed. L4-5: Circumferential  disc bulging, a left foraminal to extraforaminal disc protrusion, and mild right and moderate to severe left facet and ligamentum flavum hypertrophy result in mild spinal stenosis, mild-to-moderate left lateral recess  stenosis, and mild right and mild-to-moderate left neural foraminal stenosis, similar to the prior CT. L5-S1: Anterolisthesis with bulging uncovered disc and severe facet and ligamentum flavum hypertrophy result in severe spinal stenosis, severe bilateral lateral recess stenosis, and mild right and moderate left neural foraminal stenosis, similar to the prior CT. Potential bilateral S1 nerve root impingement. S1-2: Anterolisthesis with minimal bulging of uncovered disc and severe facet hypertrophy result in mild left greater than right neural foraminal stenosis without spinal stenosis. IMPRESSION: 1. Transitional lumbosacral anatomy. 2. Lumbar scoliosis and grade 1 anterolisthesis at L5-S1 and S1-2 due to severe facet arthrosis. 3. Severe multifactorial spinal stenosis at L5-S1 and moderate spinal stenosis at L3-4. Electronically Signed   By: Logan Bores M.D.   On: 05/04/2020 18:18   DG Hip Unilat W or Wo Pelvis 2-3 Views Left  Result Date: 04/13/2020 CLINICAL DATA:  Low back pain radiating to LEFT hip for 3 days. EXAM: DG HIP (WITH OR WITHOUT PELVIS) 2-3V LEFT COMPARISON:  Plain film of the pelvis and LEFT hip dated 06/20/2019. FINDINGS: LEFT hip arthroplasty hardware appears intact and stable in alignment. No evidence of hardware loosening. Osseous structures of the pelvis appear intact and stable in alignment. No acute appearing osseous abnormality. Soft tissues about the pelvis and LEFT hip are unremarkable. IMPRESSION: No acute findings. LEFT hip arthroplasty hardware appears intact and stable in alignment. Electronically Signed   By: Franki Cabot M.D.   On: 04/13/2020 15:53   I, Lynne Leader, personally (independently) visualized and performed the interpretation of the MRI images attached in this note.      Assessment and Plan: 75 y.o. female with lumbago with radiculopathy.  This is thought to be due to spinal stenosis seen on MRI. However in the interim [name] has significantly improved.  We had  a discussion about potential treatment options including physical therapy and epidural steroid injection.  I think she is a great candidate especially for physical therapy.  My personal interpretation of MRI shows significant muscle atrophy in the perispinal musculature and she could benefit from PT.  However she is doing so well right now she is reluctant to consider physical therapy and I think that is not unreasonable.  Plan for watchful waiting with plan for PT and/or epidural steroid injection in the future if needed.  Recheck back with me as needed.    Discussed warning signs or symptoms. Please see discharge instructions. Patient expresses understanding.   The above documentation has been reviewed and is accurate and complete Lynne Leader, M.D.  Total encounter time 20 minutes including face-to-face time with the patient and, reviewing past medical record, and charting on the date of service.   MRI findings treatment plan and options.

## 2020-05-12 NOTE — Patient Instructions (Addendum)
Thank you for coming in today.  I do think physical therapy is a good idea.   If you pain returns especially down the leg I can arrange for back injection but right now I do not think you need them.   Let me know if your pain returns or worsens.   Use the gabapentin as needed.

## 2020-05-13 ENCOUNTER — Other Ambulatory Visit: Payer: Self-pay | Admitting: Family Medicine

## 2020-05-13 DIAGNOSIS — M48061 Spinal stenosis, lumbar region without neurogenic claudication: Secondary | ICD-10-CM | POA: Insufficient documentation

## 2020-05-13 DIAGNOSIS — E1165 Type 2 diabetes mellitus with hyperglycemia: Secondary | ICD-10-CM

## 2020-05-30 ENCOUNTER — Encounter: Payer: Self-pay | Admitting: Family Medicine

## 2020-06-05 ENCOUNTER — Other Ambulatory Visit: Payer: Self-pay | Admitting: Family Medicine

## 2020-06-05 DIAGNOSIS — R42 Dizziness and giddiness: Secondary | ICD-10-CM

## 2020-06-09 DIAGNOSIS — M65831 Other synovitis and tenosynovitis, right forearm: Secondary | ICD-10-CM | POA: Diagnosis not present

## 2020-06-09 DIAGNOSIS — M79641 Pain in right hand: Secondary | ICD-10-CM | POA: Diagnosis not present

## 2020-06-11 ENCOUNTER — Ambulatory Visit: Payer: Medicare Other | Admitting: Obstetrics and Gynecology

## 2020-06-11 ENCOUNTER — Other Ambulatory Visit: Payer: Self-pay

## 2020-06-11 ENCOUNTER — Encounter: Payer: Self-pay | Admitting: Obstetrics and Gynecology

## 2020-06-11 VITALS — BP 122/68 | HR 101 | Ht 60.0 in | Wt 144.0 lb

## 2020-06-11 DIAGNOSIS — N819 Female genital prolapse, unspecified: Secondary | ICD-10-CM | POA: Diagnosis not present

## 2020-06-11 DIAGNOSIS — Z4689 Encounter for fitting and adjustment of other specified devices: Secondary | ICD-10-CM | POA: Diagnosis not present

## 2020-06-11 DIAGNOSIS — N952 Postmenopausal atrophic vaginitis: Secondary | ICD-10-CM | POA: Diagnosis not present

## 2020-06-11 NOTE — Progress Notes (Signed)
GYNECOLOGY  VISIT   HPI: 75 y.o.   Divorced White or Caucasian Not Hispanic or Latino  female   [phone] with No LMP recorded (lmp unknown). Patient has had a hysterectomy.   here for a pessary check. She has a vault prolapse that is helped with the cube pessary, but she has recurring issues with vaginal irritation. She is planning colpocleisis. Needs to figure out other issues, doing PT for her back. Has tendon issues with her right hand.   No bowel or bladder c/o. No incontinence. No vaginal bleeding. Using vaginal estrogen.     GYNECOLOGIC HISTORY: No LMP recorded (lmp unknown). Patient has had a hysterectomy. Contraception:PMP Menopausal hormone therapy: vaginal estrogen        OB History    Gravida  2   Para  2   Term  2   Preterm      AB      Living  1     SAB      IAB      Ectopic      Multiple      Live Births  2              Patient Active Problem List   Diagnosis Date Noted  . Spinal stenosis of lumbar region 05/13/2020  . Aortic atherosclerosis ([place]) 06/22/2019  . Type 2 diabetes mellitus with hyperglycemia, without long-term current use of insulin ([place]) 06/18/2016  . OA (osteoarthritis) of knee 11/03/2015  . Hyperlipidemia associated with type 2 diabetes mellitus ([place]) 07/23/2014  . Genu valgum 08/11/2012  . Hypertension associated with diabetes ([place]) 02/25/2010    Past Medical History:  Diagnosis Date  . Anemia   . Cataract   . Diabetes mellitus without complication ([place])   . Gastric ulcer 11/17/2011  . History of total left hip replacement 2011  . HTN (hypertension)   . Hx of total knee replacement 2017   11-03-2015   . Hyperlipemia   . Hyperplastic polyp of intestine 11/17/2011  . Hypertension   . Measles   . Menopause   . Migraine   . Osteoarthritis     Past Surgical History:  Procedure Laterality Date  . CARPAL TUNNEL RELEASE Right   . CATARACT EXTRACTION Bilateral   . HIP ARTHROPLASTY Left 2011  . JOINT REPLACEMENT    .  RETINAL DETACHMENT SURGERY    . TOTAL KNEE ARTHROPLASTY Right 11/03/2015   Procedure: RIGHT TOTAL KNEE ARTHROPLASTY;  Surgeon: Gaynelle Arabian, MD;  Location: [facility];  Service: Orthopedics;  Laterality: Right;  . TOTAL VAGINAL HYSTERECTOMY  2012   TVH, anterior repair, TVT (no mesh used for the anterior repair)    Current Outpatient Medications  Medication Sig Dispense Refill  . Accu-Chek FastClix Lancets MISC Check blood sugars 1-2 times per day. 100 each 2  . ACCU-CHEK GUIDE test strip TEST FOUR TIMES DAILY. 100 strip 6  . acetaminophen (TYLENOL) 325 MG tablet Take 650 mg by mouth as needed.    Marland Kitchen albuterol (VENTOLIN HFA) 108 (90 Base) MCG/ACT inhaler TAKE 2 PUFFS BY MOUTH EVERY 6 HOURS AS NEEDED FOR WHEEZE OR SHORTNESS OF BREATH 6.7 each 1  . aspirin EC 81 MG tablet Take 81 mg by mouth daily.    . Cyanocobalamin (B-12 PO) Take 1 tablet by mouth daily.    . fluticasone (FLONASE) 50 MCG/ACT nasal spray PLACE 1 SPRAY INTO BOTH NOSTRILS 2 (TWO) TIMES DAILY AS NEEDED FOR ALLERGIES OR RHINITIS. 48 mL 0  . gabapentin (NEURONTIN)  300 MG capsule Take 1 capsule (300 mg total) by mouth 3 (three) times daily. 90 capsule 0  . glipiZIDE (GLUCOTROL XL) 2.5 MG 24 hr tablet Take 1 tablet (2.5 mg total) by mouth daily with breakfast. 90 tablet 1  . meclizine (ANTIVERT) 25 MG tablet TAKE 1 TABLET (25 MG TOTAL) BY MOUTH 3 (THREE) TIMES DAILY AS NEEDED FOR DIZZINESS. 60 tablet 1  . Multiple Vitamin (MULTIVITAMIN WITH MINERALS) TABS tablet Take 1 tablet by mouth daily.    . NONFORMULARY OR COMPOUNDED ITEM Estradiol cream 0.02%, use 1/2 a gram vaginally 2 x a week at hs. 3 month supply sent, 3 refills 3 each 1  . pravastatin (PRAVACHOL) 40 MG tablet TAKE 1 TABLET BY MOUTH EVERY DAY 90 tablet 0  . traZODone (DESYREL) 50 MG tablet TAKE 1/2 TO 1 TABLET BY MOUTH AT BEDTIME AS NEEDED FOR SLEEP 90 tablet 2  . triamcinolone cream (KENALOG) 0.1 % Apply 1 application topically 4 (four) times daily as needed (bug bites).    .  triamterene-hydrochlorothiazide (DYAZIDE) 37.5-25 MG capsule Take 1 each (1 capsule total) by mouth daily. 90 capsule 3   No current facility-administered medications for this visit.     ALLERGIES: Probiotic [acidophilus]  Family History  Problem Relation Age of Onset  . Diabetes Mother        deceased  . Heart attack Mother   . Diabetes Sister        x2  . Kidney cancer Father   . Heart attack Father   . Diabetes Sister     Social History   Socioeconomic History  . Marital status: Divorced    Spouse name: Not on file  . Number of children: Not on file  . Years of education: Not on file  . Highest education level: Not on file  Occupational History  . Occupation: retired    Fish farm manager: RETIRED  Tobacco Use  . Smoking status: Never Smoker  . Smokeless tobacco: Never Used  Vaping Use  . Vaping Use: Never used  Substance and Sexual Activity  . Alcohol use: No  . Drug use: No  . Sexual activity: Not Currently    Partners: Male    Birth control/protection: Surgical    Comment: hysterectomy  Other Topics Concern  . Not on file  Social History Narrative   Divorced, 2 children; retired.    Custody of grandchildren    Social Determinants of Health   Financial Resource Strain: Not on file  Food Insecurity: Not on file  Transportation Needs: Not on file  Physical Activity: Not on file  Stress: Not on file  Social Connections: Not on file  Intimate Partner Violence: Not on file    ROS  PHYSICAL EXAMINATION:    LMP  (LMP Unknown)     General appearance: alert, cooperative and appears stated age  Pelvic: External genitalia:  no lesions              Urethra:  normal appearing urethra with no masses, tenderness or lesions              Bartholins and Skenes: normal                 Vagina: the pessary was removed and cleaned. Blood on the pessary. The posterior vaginal apex is irritated with a few small areas of ulceration, largest ~1.5 cm. 1 gram of estradiol cream  placed. Pessary left out.  Cervix: absent  Chaperone was present for exam.  1. Vaginal vault prolapse Well controlled with the pessary, but repeated vaginal irritation (have tried several different pessaries). Plans colpocleisis after she sorts out personal and medical issues  2. Pessary maintenance Pessary left out F/U in 2 weeks  3. Vaginal atrophy Increase vaginal estrogen to every other day

## 2020-06-25 NOTE — Progress Notes (Signed)
GYNECOLOGY  VISIT   HPI: 75 y.o.   Divorced White or Caucasian Not Hispanic or Latino  female   [phone] with No LMP recorded (lmp unknown). Patient has had a hysterectomy.   here for 2 week folow up. She has a vaginal vault prolapse (grade 3-4) that is helped with the cube pessary but she has recurring issues with vaginal irritation. She has been planning to have a colpocleisis in the future, ready to move forward.  Her pessary was left out a few weeks ago secondary to vaginal ulcerations at the vaginal apex. She increased her vaginal estrogen to every other day over the last 2 weeks.   H/O TVH, TVT and anterior repair. No incontinence. Voids well with the pessary in.   GYNECOLOGIC HISTORY: No LMP recorded (lmp unknown). Patient has had a hysterectomy. Contraception:Hsterectomy Menopausal hormone therapy: Estradiol 0.02% vaginal cream        OB History    Gravida  2   Para  2   Term  2   Preterm      AB      Living  1     SAB      IAB      Ectopic      Multiple      Live Births  2              Patient Active Problem List   Diagnosis Date Noted  . Spinal stenosis of lumbar region 05/13/2020  . Aortic atherosclerosis ([place]) 06/22/2019  . Type 2 diabetes mellitus with hyperglycemia, without long-term current use of insulin ([place]) 06/18/2016  . OA (osteoarthritis) of knee 11/03/2015  . Hyperlipidemia associated with type 2 diabetes mellitus ([place]) 07/23/2014  . Genu valgum 08/11/2012  . Hypertension associated with diabetes ([place]) 02/25/2010    Past Medical History:  Diagnosis Date  . Anemia   . Cataract   . Diabetes mellitus without complication ([place])   . Gastric ulcer 11/17/2011  . History of total left hip replacement 2011  . HTN (hypertension)   . Hx of total knee replacement 2017   11-03-2015   . Hyperlipemia   . Hyperplastic polyp of intestine 11/17/2011  . Hypertension   . Measles   . Menopause   . Migraine   . Osteoarthritis     Past Surgical  History:  Procedure Laterality Date  . CARPAL TUNNEL RELEASE Right   . CATARACT EXTRACTION Bilateral   . HIP ARTHROPLASTY Left 2011  . JOINT REPLACEMENT    . RETINAL DETACHMENT SURGERY    . TOTAL KNEE ARTHROPLASTY Right 11/03/2015   Procedure: RIGHT TOTAL KNEE ARTHROPLASTY;  Surgeon: Gaynelle Arabian, MD;  Location: [facility];  Service: Orthopedics;  Laterality: Right;  . TOTAL VAGINAL HYSTERECTOMY  2012   TVH, anterior repair, TVT (no mesh used for the anterior repair)    Current Outpatient Medications  Medication Sig Dispense Refill  . Accu-Chek FastClix Lancets MISC Check blood sugars 1-2 times per day. 100 each 2  . ACCU-CHEK GUIDE test strip TEST FOUR TIMES DAILY. 100 strip 6  . acetaminophen (TYLENOL) 325 MG tablet Take 650 mg by mouth as needed.    Marland Kitchen albuterol (VENTOLIN HFA) 108 (90 Base) MCG/ACT inhaler TAKE 2 PUFFS BY MOUTH EVERY 6 HOURS AS NEEDED FOR WHEEZE OR SHORTNESS OF BREATH 6.7 each 1  . aspirin EC 81 MG tablet Take 81 mg by mouth daily.    . Cyanocobalamin (B-12 PO) Take 1 tablet by mouth daily.    Marland Kitchen  fluticasone (FLONASE) 50 MCG/ACT nasal spray PLACE 1 SPRAY INTO BOTH NOSTRILS 2 (TWO) TIMES DAILY AS NEEDED FOR ALLERGIES OR RHINITIS. 48 mL 0  . gabapentin (NEURONTIN) 300 MG capsule Take 1 capsule (300 mg total) by mouth 3 (three) times daily. 90 capsule 0  . glipiZIDE (GLUCOTROL XL) 2.5 MG 24 hr tablet Take 1 tablet (2.5 mg total) by mouth daily with breakfast. 90 tablet 1  . meclizine (ANTIVERT) 25 MG tablet TAKE 1 TABLET (25 MG TOTAL) BY MOUTH 3 (THREE) TIMES DAILY AS NEEDED FOR DIZZINESS. 60 tablet 1  . Multiple Vitamin (MULTIVITAMIN WITH MINERALS) TABS tablet Take 1 tablet by mouth daily.    . NONFORMULARY OR COMPOUNDED ITEM Estradiol cream 0.02%, use 1/2 a gram vaginally 2 x a week at hs. 3 month supply sent, 3 refills 3 each 1  . pravastatin (PRAVACHOL) 40 MG tablet Take 1 tablet (40 mg total) by mouth daily. 90 tablet 0  . traZODone (DESYREL) 50 MG tablet TAKE 1/2 TO 1  TABLET BY MOUTH AT BEDTIME AS NEEDED FOR SLEEP 90 tablet 2  . triamcinolone cream (KENALOG) 0.1 % Apply 1 application topically 4 (four) times daily as needed (bug bites).    . triamterene-hydrochlorothiazide (DYAZIDE) 37.5-25 MG capsule Take 1 each (1 capsule total) by mouth daily. 90 capsule 3   No current facility-administered medications for this visit.     ALLERGIES: Probiotic [acidophilus]  Family History  Problem Relation Age of Onset  . Diabetes Mother        deceased  . Heart attack Mother   . Diabetes Sister        x2  . Kidney cancer Father   . Heart attack Father   . Diabetes Sister     Social History   Socioeconomic History  . Marital status: Divorced    Spouse name: Not on file  . Number of children: Not on file  . Years of education: Not on file  . Highest education level: Not on file  Occupational History  . Occupation: retired    Fish farm manager: RETIRED  Tobacco Use  . Smoking status: Never Smoker  . Smokeless tobacco: Never Used  Vaping Use  . Vaping Use: Never used  Substance and Sexual Activity  . Alcohol use: No  . Drug use: No  . Sexual activity: Not Currently    Partners: Male    Birth control/protection: Surgical    Comment: hysterectomy  Other Topics Concern  . Not on file  Social History Narrative   Divorced, 2 children; retired.    Custody of grandchildren    Social Determinants of Radio broadcast assistant Strain: Not on file  Food Insecurity: Not on file  Transportation Needs: Not on file  Physical Activity: Not on file  Stress: Not on file  Social Connections: Not on file  Intimate Partner Violence: Not on file    ROS  PHYSICAL EXAMINATION:    BP 110/70 (BP Location: Right Arm, Patient Position: Sitting, Cuff Size: Normal)   Ht 5' (1.524 m)   Wt 139 lb (63 kg)   LMP  (LMP Unknown)   BMI 27.15 kg/m     General appearance: alert, cooperative and appears stated age  Pelvic: External genitalia:  no lesions               Urethra:  normal appearing urethra with no masses, tenderness or lesions              Bartholins and Skenes: normal  Vagina: normal appearing vagina with normal color and discharge, no lesions. Vaginal ulcerations have healed. 1 gram of estrogen placed vaginally and cube pessary placed in the vagina.               Cervix: absent                Chaperone was present for exam.  1. Vaginal vault prolapse Well controlled with the cube pessary, but recurring issues with vaginal irritation from the pessary.  We have previously discussed her options for surgery and she has decided on colpocleisis. She has no desire to be sexually active again. She is aware that this will make intercourse not possible. She has no urinary leakage with or without the pessary in. Prior TVT. Will schedule colpocleisis, she will need preoperative clearance.   2. Pessary maintenance The pessary has been out for 2 weeks, the vagina has healed. Pessary replaced  3. Vaginal atrophy Controlled with vaginal estrogen  4. Type 2 diabetes mellitus with hyperglycemia, without long-term current use of insulin (HCC) Last HgbA1C was 7.1 (in 12/21). Needs repeat testing and pre-op clearance from her primary  5. Aortic atherosclerosis (HCC) Needs preop clearance from her primary  6. Hyperlipidemia associated with type 2 diabetes mellitus ([place]) On medication  7. Hypertension associated with diabetes ([place]) Controlled on medication  CC: Dr Rogers Blocker  Over 20 minutes was spent in total patient care.

## 2020-06-26 ENCOUNTER — Encounter: Payer: Self-pay | Admitting: Obstetrics and Gynecology

## 2020-06-26 ENCOUNTER — Ambulatory Visit: Payer: Medicare Other | Admitting: Obstetrics and Gynecology

## 2020-06-26 ENCOUNTER — Other Ambulatory Visit: Payer: Self-pay | Admitting: Family Medicine

## 2020-06-26 ENCOUNTER — Other Ambulatory Visit: Payer: Self-pay

## 2020-06-26 VITALS — BP 110/70 | Ht 60.0 in | Wt 139.0 lb

## 2020-06-26 DIAGNOSIS — E1169 Type 2 diabetes mellitus with other specified complication: Secondary | ICD-10-CM

## 2020-06-26 DIAGNOSIS — E785 Hyperlipidemia, unspecified: Secondary | ICD-10-CM | POA: Diagnosis not present

## 2020-06-26 DIAGNOSIS — Z4689 Encounter for fitting and adjustment of other specified devices: Secondary | ICD-10-CM | POA: Diagnosis not present

## 2020-06-26 DIAGNOSIS — E1159 Type 2 diabetes mellitus with other circulatory complications: Secondary | ICD-10-CM | POA: Diagnosis not present

## 2020-06-26 DIAGNOSIS — N819 Female genital prolapse, unspecified: Secondary | ICD-10-CM

## 2020-06-26 DIAGNOSIS — I700 Atherosclerosis of aorta: Secondary | ICD-10-CM | POA: Diagnosis not present

## 2020-06-26 DIAGNOSIS — E1165 Type 2 diabetes mellitus with hyperglycemia: Secondary | ICD-10-CM

## 2020-06-26 DIAGNOSIS — I152 Hypertension secondary to endocrine disorders: Secondary | ICD-10-CM

## 2020-06-26 DIAGNOSIS — N952 Postmenopausal atrophic vaginitis: Secondary | ICD-10-CM | POA: Diagnosis not present

## 2020-06-26 MED ORDER — PRAVASTATIN SODIUM 40 MG PO TABS: 40.0000 mg | ORAL_TABLET | Freq: Every day | ORAL | 0 refills | Status: DC

## 2020-07-01 ENCOUNTER — Telehealth: Payer: Self-pay | Admitting: *Deleted

## 2020-07-01 NOTE — Telephone Encounter (Signed)
Spoke with patient. Called to review surgery dates.  Patient unable to talk, request return call on 4/20

## 2020-07-02 ENCOUNTER — Telehealth: Payer: Self-pay

## 2020-07-02 NOTE — Telephone Encounter (Signed)
I told dr. Talbert Misty Cervantes I could get her in before I leave. Please just put her in a spot!  Dr. Rogers Blocker

## 2020-07-02 NOTE — Telephone Encounter (Signed)
Left message to call Zyron Deeley, RN at [facility], [phone].  

## 2020-07-02 NOTE — Telephone Encounter (Signed)
[facility] is requesting a call at [phone]. In regards to a Pre Op Clearance for pt getting surgery on  May 17th

## 2020-07-02 NOTE — Telephone Encounter (Signed)
Pt has appt on 4/29.

## 2020-07-02 NOTE — Telephone Encounter (Signed)
Spoke with patient.  Reviewed surgery dates and Covid 19 requirements, patient request to proceed with surgery on 07/29/20.  Patient is aware will need PCP clearance, has appt scheduled with Dr. Rogers Blocker on 07/10/20.  Advised patient I will return call once surgery date confirmed. Patient agreeable.      Call placed to [facility]/ Dr. Rogers Blocker.  Spoke with Janett Billow, request pre-op clearance. Was advised someone will return call to office.   Routing to [facility]

## 2020-07-02 NOTE — Telephone Encounter (Signed)
Lvm for GYN to call the office back.

## 2020-07-03 NOTE — Telephone Encounter (Signed)
Call placed to  [facility]/ Dr. Rogers Blocker, spoke with [facility], CMA.  Advised patient is scheduled for colpocleisis and cysto on 07/29/20, will need pre-op clearance.  Melita will review with Dr. Rogers Blocker and f/u.

## 2020-07-05 ENCOUNTER — Other Ambulatory Visit: Payer: Self-pay | Admitting: Family Medicine

## 2020-07-05 DIAGNOSIS — F5101 Primary insomnia: Secondary | ICD-10-CM

## 2020-07-07 ENCOUNTER — Other Ambulatory Visit: Payer: Self-pay | Admitting: Family Medicine

## 2020-07-07 ENCOUNTER — Telehealth: Payer: Self-pay | Admitting: Family Medicine

## 2020-07-07 DIAGNOSIS — J069 Acute upper respiratory infection, unspecified: Secondary | ICD-10-CM

## 2020-07-07 NOTE — Telephone Encounter (Signed)
Left message for patient to call back and schedule Medicare Annual Wellness Visit (AWV) either virtually OR in office.   Last AWV 05/24/19; please schedule at anytime with [facility]-Nurse Health Advisor at [facility].  This should be a 45 minute visit.   

## 2020-07-10 DIAGNOSIS — M65831 Other synovitis and tenosynovitis, right forearm: Secondary | ICD-10-CM | POA: Diagnosis not present

## 2020-07-11 ENCOUNTER — Ambulatory Visit (INDEPENDENT_AMBULATORY_CARE_PROVIDER_SITE_OTHER): Payer: Medicare Other | Admitting: Family Medicine

## 2020-07-11 ENCOUNTER — Ambulatory Visit (INDEPENDENT_AMBULATORY_CARE_PROVIDER_SITE_OTHER): Payer: Medicare Other

## 2020-07-11 ENCOUNTER — Other Ambulatory Visit: Payer: Self-pay

## 2020-07-11 ENCOUNTER — Encounter: Payer: Self-pay | Admitting: Family Medicine

## 2020-07-11 VITALS — BP 130/78 | HR 81 | Temp 97.9°F | Ht 60.0 in | Wt 142.2 lb

## 2020-07-11 VITALS — BP 130/78 | HR 100 | Temp 97.9°F | Wt 142.2 lb

## 2020-07-11 DIAGNOSIS — E1169 Type 2 diabetes mellitus with other specified complication: Secondary | ICD-10-CM

## 2020-07-11 DIAGNOSIS — I700 Atherosclerosis of aorta: Secondary | ICD-10-CM

## 2020-07-11 DIAGNOSIS — Z01818 Encounter for other preprocedural examination: Secondary | ICD-10-CM

## 2020-07-11 DIAGNOSIS — E785 Hyperlipidemia, unspecified: Secondary | ICD-10-CM

## 2020-07-11 DIAGNOSIS — E1159 Type 2 diabetes mellitus with other circulatory complications: Secondary | ICD-10-CM | POA: Diagnosis not present

## 2020-07-11 DIAGNOSIS — I152 Hypertension secondary to endocrine disorders: Secondary | ICD-10-CM | POA: Diagnosis not present

## 2020-07-11 DIAGNOSIS — Z0000 Encounter for general adult medical examination without abnormal findings: Secondary | ICD-10-CM | POA: Diagnosis not present

## 2020-07-11 DIAGNOSIS — E1165 Type 2 diabetes mellitus with hyperglycemia: Secondary | ICD-10-CM | POA: Diagnosis not present

## 2020-07-11 LAB — CBC WITH DIFFERENTIAL/PLATELET
Basophils Absolute: 0 10*3/uL (ref 0.0–0.1)
Basophils Relative: 0.6 % (ref 0.0–3.0)
Eosinophils Absolute: 0.1 10*3/uL (ref 0.0–0.7)
Eosinophils Relative: 2.8 % (ref 0.0–5.0)
HCT: 40.4 % (ref 36.0–46.0)
Hemoglobin: 13.9 g/dL (ref 12.0–15.0)
Lymphocytes Relative: 24.8 % (ref 12.0–46.0)
Lymphs Abs: 1.3 10*3/uL (ref 0.7–4.0)
MCHC: 34.4 g/dL (ref 30.0–36.0)
MCV: 92.2 fl (ref 78.0–100.0)
Monocytes Absolute: 0.4 10*3/uL (ref 0.1–1.0)
Monocytes Relative: 6.9 % (ref 3.0–12.0)
Neutro Abs: 3.4 10*3/uL (ref 1.4–7.7)
Neutrophils Relative %: 64.9 % (ref 43.0–77.0)
Platelets: 240 10*3/uL (ref 150.0–400.0)
RBC: 4.38 Mil/uL (ref 3.87–5.11)
RDW: 13.3 % (ref 11.5–15.5)
WBC: 5.2 10*3/uL (ref 4.0–10.5)

## 2020-07-11 LAB — LIPID PANEL
Cholesterol: 168 mg/dL (ref 0–200)
HDL: 38 mg/dL — ABNORMAL LOW (ref >39.00)
LDL Cholesterol: 93 mg/dL (ref 0–99)
NonHDL: 129.82
Total CHOL/HDL Ratio: 4
Triglycerides: 185 mg/dL — ABNORMAL HIGH (ref 0.0–149.0)
VLDL: 37 mg/dL (ref 0.0–40.0)

## 2020-07-11 LAB — COMPREHENSIVE METABOLIC PANEL
ALT: 14 U/L (ref 0–35)
AST: 16 U/L (ref 0–37)
Albumin: 4.3 g/dL (ref 3.5–5.2)
Alkaline Phosphatase: 74 U/L (ref 39–117)
BUN: 28 mg/dL — ABNORMAL HIGH (ref 6–23)
CO2: 32 mEq/L (ref 19–32)
Calcium: 9.1 mg/dL (ref 8.4–10.5)
Chloride: 98 mEq/L (ref 96–112)
Creatinine, Ser: 1.5 mg/dL — ABNORMAL HIGH (ref 0.40–1.20)
GFR: 33.87 mL/min — ABNORMAL LOW (ref >60.00)
Glucose, Bld: 133 mg/dL — ABNORMAL HIGH (ref 70–99)
Potassium: 3.9 mEq/L (ref 3.5–5.1)
Sodium: 139 mEq/L (ref 135–145)
Total Bilirubin: 0.9 mg/dL (ref 0.2–1.2)
Total Protein: 6.9 g/dL (ref 6.0–8.3)

## 2020-07-11 LAB — HEMOGLOBIN A1C: Hgb A1c MFr Bld: 7.4 % — ABNORMAL HIGH (ref 4.6–6.5)

## 2020-07-11 NOTE — Patient Instructions (Signed)
-  will clear you for surgery as long as diabetes well controlled.   -routine labs and referral to eye doctor. Your foot exam is normal.   -keep up the diet and exercise.   Will have you follow up with dr. Jonni Sanger for routine visit in 6 months! (3 months if diabetes not controlled)  Thanks for letting me take care of you.  Dr. Rogers Blocker

## 2020-07-11 NOTE — Progress Notes (Signed)
Patient: Misty Cervantes MRN: [id number] DOB: 16-May-1945 PCP: Orma Flaming, MD     Subjective:  Chief Complaint  Patient presents with  . Pre-op Exam    Bladder surgery 07/29/2020.  Marland Kitchen Hyperlipidemia  . Diabetes  . Hypertension    HPI: The patient is a 75 y.o. female who presents today for Pre Op. She has past medical history significant for diabetes, HTN, hyperlipidemia, aortic atherosclerosis, spinal stenosis and vaginal vault prolapse. She has been using a cube pessary, but due to recurring issues with vaginal irritation from the pessary she has opted to have surgery-colpocleisis with Dr. Talbert Nan. Dr. Talbert Nan is requesting pre-op clearance with history of diabetes and need for a1c recheck.   1) diabetes Last a1c was 7.1 about 4+ months ago. She is currently on glipizide XR 2.5mg /day. She takes medication as prescribed. Limited exercise.  -on statin/upuc today  -utd on vaccines -needs eye exam.  -foot exam today   2) aortic atherosclerosis On statin, continue this.   3) HTN -currently on dyazide 37.5-25mg /day. She has been well controlled on this regimen.   4) hyperlipidemia -due for recheck. Was not to goal on last check over a year ago. Currently on pravachol 40mg /day.   Review of Systems  Constitutional: Negative for chills, fatigue and fever.  HENT: Negative for dental problem, ear pain, hearing loss and trouble swallowing.   Eyes: Negative for visual disturbance.  Respiratory: Negative for cough, chest tightness and shortness of breath.   Cardiovascular: Negative for chest pain, palpitations and leg swelling.  Gastrointestinal: Negative for abdominal pain, blood in stool, diarrhea and nausea.  Endocrine: Negative for cold intolerance, polydipsia, polyphagia and polyuria.  Genitourinary: Negative for dysuria and hematuria.  Musculoskeletal: Negative for arthralgias.  Skin: Negative for rash.  Neurological: Negative for dizziness and headaches.   Psychiatric/Behavioral: Negative for dysphoric mood and sleep disturbance. The patient is not nervous/anxious.     Allergies Patient is allergic to probiotic [acidophilus].  Past Medical History Patient  has a past medical history of Anemia, Cataract, Diabetes mellitus without complication ([place]), Gastric ulcer (11/17/2011), History of total left hip replacement (2011), HTN (hypertension), total knee replacement (2017), Hyperlipemia, Hyperplastic polyp of intestine (11/17/2011), Hypertension, Measles, Menopause, Migraine, and Osteoarthritis.  Surgical History Patient  has a past surgical history that includes Total vaginal hysterectomy (2012); Cataract extraction (Bilateral); Retinal detachment surgery; Hip Arthroplasty (Left, 2011); Joint replacement; Total knee arthroplasty (Right, 11/03/2015); and Carpal tunnel release (Right).  Family History Pateint's family history includes Diabetes in her mother, sister, and sister; Heart attack in her father and mother; Kidney cancer in her father.  Social History Patient  reports that she has never smoked. She has never used smokeless tobacco. She reports that she does not drink alcohol and does not use drugs.    Objective: Vitals:   07/11/20 1312  BP: 130/78  Pulse: 81  Temp: 97.9 F (36.6 C)  TempSrc: Temporal  SpO2: 97%  Weight: 142 lb 3.2 oz (64.5 kg)  Height: 5' (1.524 m)    Body mass index is 27.77 kg/m.  Physical Exam Vitals reviewed.  Constitutional:      Appearance: Normal appearance. She is well-developed. She is obese.  HENT:     Head: Normocephalic and atraumatic.     Right Ear: Tympanic membrane, ear canal and external ear normal.     Left Ear: Tympanic membrane, ear canal and external ear normal.     Mouth/Throat:     Mouth: Mucous membranes are moist.  Eyes:  Extraocular Movements: Extraocular movements intact.     Conjunctiva/sclera: Conjunctivae normal.     Pupils: Pupils are equal, round, and reactive to  light.  Neck:     Thyroid: No thyromegaly.     Vascular: No carotid bruit.  Cardiovascular:     Rate and Rhythm: Normal rate and regular rhythm.     Pulses: Normal pulses.     Heart sounds: Normal heart sounds. No murmur heard.   Pulmonary:     Effort: Pulmonary effort is normal.     Breath sounds: Normal breath sounds.  Abdominal:     General: Bowel sounds are normal. There is no distension.     Palpations: Abdomen is soft.     Tenderness: There is no abdominal tenderness.  Musculoskeletal:     Cervical back: Normal range of motion and neck supple.  Lymphadenopathy:     Cervical: No cervical adenopathy.  Skin:    General: Skin is warm and dry.     Capillary Refill: Capillary refill takes less than 2 seconds.     Findings: No rash.  Neurological:     General: No focal deficit present.     Mental Status: She is alert and oriented to person, place, and time.     Cranial Nerves: No cranial nerve deficit.     Coordination: Coordination normal.     Deep Tendon Reflexes: Reflexes normal.  Psychiatric:        Mood and Affect: Mood normal.        Behavior: Behavior normal.       ekg: nsr rate of 74.   Diabetic Foot Exam - Simple   Simple Foot Form Diabetic Foot exam was performed with the following findings: Yes 07/11/2020  1:56 PM  Visual Inspection Sensation Testing Pulse Check Comments Normal foot exam.      Assessment/plan: 1. Pre-op examination -cleared pending labs, chronic diseases stable and well controlled as long as a1c to goal.  -stopped her ASA, no indication for this.  -will send to gyn once I have her lab work back.   2. Hypertension associated with diabetes ([place]) Blood pressure is to goal. Continue current anti-hypertensive medication-dyazide daily. Refills not given and routine lab work will be done today. Recommended routine exercise and healthy diet including DASH diet and mediterranean diet. Encouraged weight loss. F/u in 6 months.   - CBC with  Differential/Platelet - Comprehensive metabolic panel  3. Type 2 diabetes mellitus with hyperglycemia, without long-term current use of insulin (HCC) -on statin -utd on vaccines/foot exam today and wnl -routine labs -referral for eye exam.  -f/u in 6 months.  - Hemoglobin A1c - Microalbumin / creatinine urine ratio  4. Hyperlipidemia associated with type 2 diabetes mellitus ([place]) -due for lipid panel today, she is not fasting.  -continue statin in setting of diabetes and elevated ascvd risk The 10-year ASCVD risk score Mikey Bussing DC Jr., et al., 2013) is: 37.9%  - Lipid panel  5. Aortic atherosclerosis (HCC) -continue statin therapy.   -stop ASA therapy, no indication for preventative use at this time per her medical hx and new guidelines.    Return in about 6 months (around 01/10/2021) for routine diabetic/htn follow up with dr. Jonni Sanger ([place]) .   Orma Flaming, MD [facility]   07/11/2020

## 2020-07-11 NOTE — Progress Notes (Signed)
Subjective:   Misty Cervantes is a 75 y.o. female who presents for Medicare Annual (Subsequent) preventive examination.  Review of Systems     Cardiac Risk Factors include: advanced age (>41men, >60 women);diabetes mellitus;hypertension;dyslipidemia     Objective:    Today's Vitals   07/11/20 1141  BP: 130/78  Pulse: 100  Temp: 97.9 F (36.6 C)  SpO2: (!) 8%  Weight: 142 lb 3.2 oz (64.5 kg)   Body mass index is 27.77 kg/m.  Advanced Directives 07/11/2020 06/20/2019 05/24/2019 03/01/2019 10/21/2017 07/22/2016 06/28/2016  Does Patient Have a Medical Advance Directive? Yes No No No Yes No No  Does patient want to make changes to medical advance directive? Yes (MAU/Ambulatory/Procedural Areas - Information given) - Yes (MAU/Ambulatory/Procedural Areas - Information given) - - - -  Copy of Healthcare Power of Attorney in Chart? - - - - - - -  Would patient like information on creating a medical advance directive? - No - Patient declined - No - Patient declined - No - Patient declined Yes (MAU/Ambulatory/Procedural Areas - Information given)    Current Medications (verified) Outpatient Encounter Medications as of 07/11/2020  Medication Sig  . Accu-Chek FastClix Lancets MISC Check blood sugars 1-2 times per day.  Marland Kitchen ACCU-CHEK GUIDE test strip TEST FOUR TIMES DAILY.  Marland Kitchen acetaminophen (TYLENOL) 325 MG tablet Take 650 mg by mouth as needed.  Marland Kitchen aspirin EC 81 MG tablet Take 81 mg by mouth daily.  . Cyanocobalamin (B-12 PO) Take 1 tablet by mouth daily.  . fluticasone (FLONASE) 50 MCG/ACT nasal spray PLACE 1 SPRAY INTO BOTH NOSTRILS 2 (TWO) TIMES DAILY AS NEEDED FOR ALLERGIES OR RHINITIS.  Marland Kitchen glipiZIDE (GLUCOTROL XL) 2.5 MG 24 hr tablet Take 1 tablet (2.5 mg total) by mouth daily with breakfast.  . meclizine (ANTIVERT) 25 MG tablet TAKE 1 TABLET (25 MG TOTAL) BY MOUTH 3 (THREE) TIMES DAILY AS NEEDED FOR DIZZINESS.  . Multiple Vitamin (MULTIVITAMIN WITH MINERALS) TABS tablet Take 1 tablet by mouth  daily.  . NONFORMULARY OR COMPOUNDED ITEM Estradiol cream 0.02%, use 1/2 a gram vaginally 2 x a week at hs. 3 month supply sent, 3 refills  . pravastatin (PRAVACHOL) 40 MG tablet Take 1 tablet (40 mg total) by mouth daily.  Marland Kitchen triamterene-hydrochlorothiazide (DYAZIDE) 37.5-25 MG capsule Take 1 each (1 capsule total) by mouth daily.  Marland Kitchen albuterol (VENTOLIN HFA) 108 (90 Base) MCG/ACT inhaler TAKE 2 PUFFS BY MOUTH EVERY 6 HOURS AS NEEDED FOR WHEEZE OR SHORTNESS OF BREATH (Patient not taking: Reported on 07/11/2020)  . traZODone (DESYREL) 50 MG tablet TAKE 1/2 TO 1 TABLET BY MOUTH AT BEDTIME AS NEEDED FOR SLEEP (Patient not taking: Reported on 07/11/2020)  . triamcinolone cream (KENALOG) 0.1 % Apply 1 application topically 4 (four) times daily as needed (bug bites). (Patient not taking: Reported on 07/11/2020)  . [DISCONTINUED] gabapentin (NEURONTIN) 300 MG capsule Take 1 capsule (300 mg total) by mouth 3 (three) times daily.   No facility-administered encounter medications on file as of 07/11/2020.    Allergies (verified) Probiotic [acidophilus]   History: Past Medical History:  Diagnosis Date  . Anemia   . Cataract   . Diabetes mellitus without complication ([place])   . Gastric ulcer 11/17/2011  . History of total left hip replacement 2011  . HTN (hypertension)   . Hx of total knee replacement 2017   11-03-2015   . Hyperlipemia   . Hyperplastic polyp of intestine 11/17/2011  . Hypertension   . Measles   .  Menopause   . Migraine   . Osteoarthritis    Past Surgical History:  Procedure Laterality Date  . CARPAL TUNNEL RELEASE Right   . CATARACT EXTRACTION Bilateral   . HIP ARTHROPLASTY Left 2011  . JOINT REPLACEMENT    . RETINAL DETACHMENT SURGERY    . TOTAL KNEE ARTHROPLASTY Right 11/03/2015   Procedure: RIGHT TOTAL KNEE ARTHROPLASTY;  Surgeon: Gaynelle Arabian, MD;  Location: [facility];  Service: Orthopedics;  Laterality: Right;  . TOTAL VAGINAL HYSTERECTOMY  2012   TVH, anterior repair, TVT  (no mesh used for the anterior repair)   Family History  Problem Relation Age of Onset  . Diabetes Mother        deceased  . Heart attack Mother   . Diabetes Sister        x2  . Kidney cancer Father   . Heart attack Father   . Diabetes Sister    Social History   Socioeconomic History  . Marital status: Divorced    Spouse name: Not on file  . Number of children: Not on file  . Years of education: Not on file  . Highest education level: Not on file  Occupational History  . Occupation: retired    Fish farm manager: RETIRED  Tobacco Use  . Smoking status: Never Smoker  . Smokeless tobacco: Never Used  Vaping Use  . Vaping Use: Never used  Substance and Sexual Activity  . Alcohol use: No  . Drug use: No  . Sexual activity: Not Currently    Partners: Male    Birth control/protection: Surgical    Comment: hysterectomy  Other Topics Concern  . Not on file  Social History Narrative   Divorced, 2 children; retired.    Custody of grandchildren    Social Determinants of Health   Financial Resource Strain: Low Risk   . Difficulty of Paying Living Expenses: Not hard at all  Food Insecurity: No Food Insecurity  . Worried About Charity fundraiser in the Last Year: Never true  . Ran Out of Food in the Last Year: Never true  Transportation Needs: No Transportation Needs  . Lack of Transportation (Medical): No  . Lack of Transportation (Non-Medical): No  Physical Activity: Inactive  . Days of Exercise per Week: 0 days  . Minutes of Exercise per Session: 0 min  Stress: Stress Concern Present  . Feeling of Stress : To some extent  Social Connections: Socially Isolated  . Frequency of Communication with Friends and Family: More than three times a week  . Frequency of Social Gatherings with Friends and Family: More than three times a week  . Attends Religious Services: Never  . Active Member of Clubs or Organizations: No  . Attends Archivist Meetings: Never  . Marital  Status: Divorced    Tobacco Counseling Counseling given: Not Answered   Clinical Intake:  Pre-visit preparation completed: Yes  Pain : No/denies pain     BMI - recorded: 27.77 Nutritional Status: BMI 25 -29 Overweight Nutritional Risks: None Diabetes: Yes CBG done?: No CBG resulted in Enter/ Edit results?: No Did pt. bring in CBG monitor from home?: No  How often do you need to have someone help you when you read instructions, pamphlets, or other written materials from your doctor or pharmacy?: 1 - Never  Diabetic?Nutrition Risk Assessment:  Has the patient had any N/V/D within the last 2 months?  No  Does the patient have any non-healing wounds?  No  Has the  patient had any unintentional weight loss or weight gain?  No   Diabetes:  Is the patient diabetic?  Yes  If diabetic, was a CBG obtained today?  No  Did the patient bring in their glucometer from home?  No  How often do you monitor your CBG's? Daily .   Financial Strains and Diabetes Management:  Are you having any financial strains with the device, your supplies or your medication? No .  Does the patient want to be seen by Chronic Care Management for management of their diabetes?  No  Would the patient like to be referred to a Nutritionist or for Diabetic Management?  No   Diabetic Exams:  Diabetic Eye Exam: Overdue for diabetic eye exam. Pt has been advised about the importance in completing this exam. Patient advised to call and schedule an eye exam. Diabetic Foot Exam: Overdue, Pt has been advised about the importance in completing this exam. Pt is scheduled for diabetic foot exam on 07/11/20 todays appt .   Interpreter Needed?: No  Information entered by :: Charlott Rakes, LPN   Activities of Daily Living In your present state of health, do you have any difficulty performing the following activities: 07/11/2020  Hearing? Y  Comment mild loss  Vision? N  Difficulty concentrating or making decisions?  N  Walking or climbing stairs? N  Dressing or bathing? N  Doing errands, shopping? N  Preparing Food and eating ? N  Using the Toilet? N  In the past six months, have you accidently leaked urine? Y  Comment at times wears a brief  Do you have problems with loss of bowel control? N  Managing your Medications? N  Managing your Finances? N  Housekeeping or managing your Housekeeping? N  Some recent data might be hidden    Patient Care Team: Orma Flaming, MD as PCP - General (Family Medicine) Gaynelle Arabian, MD as Consulting Physician (Orthopedic Surgery) Salvadore Dom, MD as Consulting Physician (Obstetrics and Gynecology) Calvert Cantor, MD as Consulting Physician (Ophthalmology) Florinda Marker as Physician Assistant (Physician Assistant) Salvadore Dom, MD as Consulting Physician (Obstetrics and Gynecology)  Indicate any recent Medical Services you may have received from other than [facility] providers in the past year (date may be approximate).     Assessment:   This is a routine wellness examination for [place].  Hearing/Vision screen  Hearing Screening   125Hz  250Hz  500Hz  1000Hz  2000Hz  3000Hz  4000Hz  6000Hz  8000Hz   Right ear:           Left ear:           Comments: Pt has mild loss   Vision Screening Comments: Pt hasn't been to eye Dr since covid   Dietary issues and exercise activities discussed: Current Exercise Habits: The patient does not participate in regular exercise at present  Goals    . DIET - INCREASE WATER INTAKE     Try to drink water at meals; Keep a cooler in the car  Will exercise when she eats or bs is high  [facility] when the kids are in school     . Increase exercise to 3 days per week for one hour    . patient     Try to learn how to unwind...  Can try to get back to the [employer] and meet back up with friends     . Patient Stated     Stay healthy      Depression Screen PHQ  2/9 Scores 07/11/2020 02/13/2020 06/22/2019 05/24/2019  04/11/2018 10/21/2017 07/22/2016  PHQ - 2 Score 0 0 0 0 1 0 0  PHQ- 9 Score - - 3 - 3 - -    Fall Risk Fall Risk  07/11/2020 02/13/2020 11/14/2019 07/12/2019 05/24/2019  Falls in the past year? 0 0 0 1 1  Comment - - - - -  Number falls in past yr: 0 - - 1 0  Comment - - - 3 -  Injury with Fall? 0 - - 1 1  Comment - - - Pt fell on left side. -  Risk for fall due to : Impaired balance/gait;Impaired vision - No Fall Risks - History of fall(s)  Follow up Falls prevention discussed - - - Falls evaluation completed;Education provided;Falls prevention discussed    FALL RISK PREVENTION PERTAINING TO THE HOME:  Any stairs in or around the home? Yes  If so, are there any without handrails? No  Home free of loose throw rugs in walkways, pet beds, electrical cords, etc? Yes  Adequate lighting in your home to reduce risk of falls? Yes   ASSISTIVE DEVICES UTILIZED TO PREVENT FALLS:  Life alert? No  Use of a cane, walker or w/c? No  Grab bars in the bathroom? No  Shower chair or bench in shower? No  Elevated toilet seat or a handicapped toilet? No   TIMED UP AND GO:  Was the test performed? Yes .  Length of time to ambulate 10 feet: 10 sec.   Gait steady and fast without use of assistive device  Cognitive Function: MMSE - Mini Mental State Exam 10/21/2017  Not completed: (No Data)     6CIT Screen 07/11/2020 05/24/2019  What Year? 0 points 0 points  What month? 0 points 0 points  What time? - 0 points  Count back from 20 0 points 0 points  Months in reverse 0 points 0 points  Repeat phrase 0 points 0 points  Total Score - 0    Immunizations Immunization History  Administered Date(s) Administered  . Fluad Quad(high Dose 65+) 11/19/2018  . Influenza, High Dose Seasonal PF 04/11/2018  . Influenza,inj,Quad PF,6+ Mos 01/28/2015  . PFIZER(Purple Top)SARS-COV-2 Vaccination 08/11/2019, 09/02/2019, 05/28/2020  . Pneumococcal Polysaccharide-23 01/01/2011, 04/11/2018  . Zoster Recombinat  (Shingrix) 03/26/2018, 11/19/2018    TDAP status: Due, Education has been provided regarding the importance of this vaccine. Advised may receive this vaccine at local pharmacy or Health Dept. Aware to provide a copy of the vaccination record if obtained from local pharmacy or Health Dept. Verbalized acceptance and understanding.  Flu Vaccine status: Declined, Education has been provided regarding the importance of this vaccine but patient still declined. Advised may receive this vaccine at local pharmacy or Health Dept. Aware to provide a copy of the vaccination record if obtained from local pharmacy or Health Dept. Verbalized acceptance and understanding.  Pneumococcal vaccine status: Up to date  Covid-19 vaccine status: Completed vaccines  Qualifies for Shingles Vaccine? Yes   Zostavax completed Yes   Shingrix Completed?: Yes  Screening Tests Health Maintenance  Topic Date Due  . TETANUS/TDAP  Never done  . OPHTHALMOLOGY EXAM  08/06/2017  . FOOT EXAM  06/18/2018  . URINE MICROALBUMIN  07/12/2020  . HEMOGLOBIN A1C  08/13/2020  . INFLUENZA VACCINE  10/13/2020  . COLONOSCOPY (Pts 45-42yrs Insurance coverage will need to be confirmed)  11/16/2021  . DEXA SCAN  Completed  . COVID-19 Vaccine  Completed  . HPV VACCINES  Aged Out  .  Hepatitis C Screening  Discontinued  . PNA vac Low Risk Adult  Discontinued    Health Maintenance  Health Maintenance Due  Topic Date Due  . TETANUS/TDAP  Never done  . OPHTHALMOLOGY EXAM  08/06/2017  . FOOT EXAM  06/18/2018  . URINE MICROALBUMIN  07/12/2020    Colorectal cancer screening: Type of screening: Colonoscopy. Completed 11/17/11. Repeat every 10 years  Mammogram status: Completed 12/04/18. Repeat every year  Bone Density status: Completed 08/26/16. Results reflect: Bone density results: NORMAL. Repeat every 0 years.   Additional Screening:  Hepatitis C Screening: does not qualify;  Vision Screening: Recommended annual ophthalmology  exams for early detection of glaucoma and other disorders of the eye. Is the patient up to date with their annual eye exam?  No  Who is the provider or what is the name of the office in which the patient attends annual eye exams? Pt stated he will Make an appt  If pt is not established with a provider, would they like to be referred to a provider to establish care? No .   Dental Screening: Recommended annual dental exams for proper oral hygiene  Community Resource Referral / Chronic Care Management: CRR required this visit?  No   CCM required this visit?  No      Plan:     I have personally reviewed and noted the following in the patient's chart:   . Medical and social history . Use of alcohol, tobacco or illicit drugs  . Current medications and supplements . Functional ability and status . Nutritional status . Physical activity . Advanced directives . List of other physicians . Hospitalizations, surgeries, and ER visits in previous 12 months . Vitals . Screenings to include cognitive, depression, and falls . Referrals and appointments  In addition, I have reviewed and discussed with patient certain preventive protocols, quality metrics, and best practice recommendations. A written personalized care plan for preventive services as well as general preventive health recommendations were provided to patient.     Willette Brace, LPN   7/32/2025   Nurse Notes: None

## 2020-07-11 NOTE — Patient Instructions (Signed)
Misty Cervantes , Thank you for taking time to come for your Medicare Wellness Visit. I appreciate your ongoing commitment to your health goals. Please review the following plan we discussed and let me know if I can assist you in the future.   Screening recommendations/referrals: Colonoscopy: Done 11/17/11 Mammogram: Done 12/04/18 Bone Density: Done 08/26/16 Recommended yearly ophthalmology/optometry visit for glaucoma screening and checkup Recommended yearly dental visit for hygiene and checkup  Vaccinations: Influenza vaccine: Declined  Pneumococcal vaccine: Up to date Tdap vaccine: Due and discussed Shingles vaccine: completed 1/12 & 11/19/18   Covid-19:Completed 5/29, 09/02/19 & 05/28/20  Advanced directives: Advance directive discussed with you today. I have provided a copy for you to complete at home and have notarized. Once this is complete please bring a copy in to our office so we can scan it into your chart.   Conditions/risks identified: Stay Healthy   Next appointment: Follow up in one year for your annual wellness visit    Preventive Care 76 Years and Older, Female Preventive care refers to lifestyle choices and visits with your health care provider that can promote health and wellness. What does preventive care include?  A yearly physical exam. This is also called an annual well check.  Dental exams once or twice a year.  Routine eye exams. Ask your health care provider how often you should have your eyes checked.  Personal lifestyle choices, including:  Daily care of your teeth and gums.  Regular physical activity.  Eating a healthy diet.  Avoiding tobacco and drug use.  Limiting alcohol use.  Practicing safe sex.  Taking low-dose aspirin every day.  Taking vitamin and mineral supplements as recommended by your health care provider. What happens during an annual well check? The services and screenings done by your health care provider during your annual well  check will depend on your age, overall health, lifestyle risk factors, and family history of disease. Counseling  Your health care provider may ask you questions about your:  Alcohol use.  Tobacco use.  Drug use.  Emotional well-being.  Home and relationship well-being.  Sexual activity.  Eating habits.  History of falls.  Memory and ability to understand (cognition).  Work and work Statistician.  Reproductive health. Screening  You may have the following tests or measurements:  Height, weight, and BMI.  Blood pressure.  Lipid and cholesterol levels. These may be checked every 5 years, or more frequently if you are over 48 years old.  Skin check.  Lung cancer screening. You may have this screening every year starting at age 72 if you have a 30-pack-year history of smoking and currently smoke or have quit within the past 15 years.  Fecal occult blood test (FOBT) of the stool. You may have this test every year starting at age 43.  Flexible sigmoidoscopy or colonoscopy. You may have a sigmoidoscopy every 5 years or a colonoscopy every 10 years starting at age 4.  Hepatitis C blood test.  Hepatitis B blood test.  Sexually transmitted disease (STD) testing.  Diabetes screening. This is done by checking your blood sugar (glucose) after you have not eaten for a while (fasting). You may have this done every 1-3 years.  Bone density scan. This is done to screen for osteoporosis. You may have this done starting at age 31.  Mammogram. This may be done every 1-2 years. Talk to your health care provider about how often you should have regular mammograms. Talk with your health care provider about  your test results, treatment options, and if necessary, the need for more tests. Vaccines  Your health care provider may recommend certain vaccines, such as:  Influenza vaccine. This is recommended every year.  Tetanus, diphtheria, and acellular pertussis (Tdap, Td) vaccine. You  may need a Td booster every 10 years.  Zoster vaccine. You may need this after age 59.  Pneumococcal 13-valent conjugate (PCV13) vaccine. One dose is recommended after age 66.  Pneumococcal polysaccharide (PPSV23) vaccine. One dose is recommended after age 32. Talk to your health care provider about which screenings and vaccines you need and how often you need them. This information is not intended to replace advice given to you by your health care provider. Make sure you discuss any questions you have with your health care provider. Document Released: 03/28/2015 Document Revised: 11/19/2015 Document Reviewed: 12/31/2014 Elsevier Interactive Patient Education  2017 [place] Prevention in the Home Falls can cause injuries. They can happen to people of all ages. There are many things you can do to make your home safe and to help prevent falls. What can I do on the outside of my home?  Regularly fix the edges of walkways and driveways and fix any cracks.  Remove anything that might make you trip as you walk through a door, such as a raised step or threshold.  Trim any bushes or trees on the path to your home.  Use bright outdoor lighting.  Clear any walking paths of anything that might make someone trip, such as rocks or tools.  Regularly check to see if handrails are loose or broken. Make sure that both sides of any steps have handrails.  Any raised decks and porches should have guardrails on the edges.  Have any leaves, snow, or ice cleared regularly.  Use sand or salt on walking paths during winter.  Clean up any spills in your garage right away. This includes oil or grease spills. What can I do in the bathroom?  Use night lights.  Install grab bars by the toilet and in the tub and shower. Do not use towel bars as grab bars.  Use non-skid mats or decals in the tub or shower.  If you need to sit down in the shower, use a plastic, non-slip stool.  Keep the floor  dry. Clean up any water that spills on the floor as soon as it happens.  Remove soap buildup in the tub or shower regularly.  Attach bath mats securely with double-sided non-slip rug tape.  Do not have throw rugs and other things on the floor that can make you trip. What can I do in the bedroom?  Use night lights.  Make sure that you have a light by your bed that is easy to reach.  Do not use any sheets or blankets that are too big for your bed. They should not hang down onto the floor.  Have a firm chair that has side arms. You can use this for support while you get dressed.  Do not have throw rugs and other things on the floor that can make you trip. What can I do in the kitchen?  Clean up any spills right away.  Avoid walking on wet floors.  Keep items that you use a lot in easy-to-reach places.  If you need to reach something above you, use a strong step stool that has a grab bar.  Keep electrical cords out of the way.  Do not use floor polish or wax  that makes floors slippery. If you must use wax, use non-skid floor wax.  Do not have throw rugs and other things on the floor that can make you trip. What can I do with my stairs?  Do not leave any items on the stairs.  Make sure that there are handrails on both sides of the stairs and use them. Fix handrails that are broken or loose. Make sure that handrails are as long as the stairways.  Check any carpeting to make sure that it is firmly attached to the stairs. Fix any carpet that is loose or worn.  Avoid having throw rugs at the top or bottom of the stairs. If you do have throw rugs, attach them to the floor with carpet tape.  Make sure that you have a light switch at the top of the stairs and the bottom of the stairs. If you do not have them, ask someone to add them for you. What else can I do to help prevent falls?  Wear shoes that:  Do not have high heels.  Have rubber bottoms.  Are comfortable and fit you  well.  Are closed at the toe. Do not wear sandals.  If you use a stepladder:  Make sure that it is fully opened. Do not climb a closed stepladder.  Make sure that both sides of the stepladder are locked into place.  Ask someone to hold it for you, if possible.  Clearly mark and make sure that you can see:  Any grab bars or handrails.  First and last steps.  Where the edge of each step is.  Use tools that help you move around (mobility aids) if they are needed. These include:  Canes.  Walkers.  Scooters.  Crutches.  Turn on the lights when you go into a dark area. Replace any light bulbs as soon as they burn out.  Set up your furniture so you have a clear path. Avoid moving your furniture around.  If any of your floors are uneven, fix them.  If there are any pets around you, be aware of where they are.  Review your medicines with your doctor. Some medicines can make you feel dizzy. This can increase your chance of falling. Ask your doctor what other things that you can do to help prevent falls. This information is not intended to replace advice given to you by your health care provider. Make sure you discuss any questions you have with your health care provider. Document Released: 12/26/2008 Document Revised: 08/07/2015 Document Reviewed: 04/05/2014 Elsevier Interactive Patient Education  2017 [employer].

## 2020-07-15 LAB — MICROALBUMIN / CREATININE URINE RATIO
Creatinine,U: 67.4 mg/dL
Microalb Creat Ratio: 1.7 mg/g (ref 0.0–30.0)
Microalb, Ur: 1.1 mg/dL (ref 0.0–1.9)

## 2020-07-16 ENCOUNTER — Other Ambulatory Visit: Payer: Self-pay | Admitting: Family Medicine

## 2020-07-16 MED ORDER — AMLODIPINE BESYLATE 5 MG PO TABS: 5.0000 mg | ORAL_TABLET | Freq: Every day | ORAL | 0 refills | Status: DC

## 2020-07-16 NOTE — Telephone Encounter (Signed)
Call placed to [facility], spoke with [facility] [facility]. Was advised that she will f/u with surgery clearance and f/u.    Confirmed with Dr. Talbert Nan, no fax received.

## 2020-07-16 NOTE — Telephone Encounter (Signed)
Spoke with patient, she is unable to talk, will return call later.

## 2020-07-17 ENCOUNTER — Telehealth: Payer: Self-pay

## 2020-07-17 NOTE — Telephone Encounter (Signed)
Pt was called back to address her concerns. Please see lab note.

## 2020-07-17 NOTE — Telephone Encounter (Signed)
-----   Message from Salvadore Dom, MD sent at 07/16/2020  3:08 PM EDT ----- See note from primary below. Looks like she wants to hold on surgery until her BP is sorted out. Looks like she is following up there in one week.   ----- Message ----- From: Orma Flaming, MD Sent: 07/16/2020  12:28 PM EDT To: Salvadore Dom, MD, [place]  Melitta, please call her so we can make sure she gets this message with the changes..  1) her kidney function is elevated again. I wonder if her blood pressure medication could be contributing if she isn't drinking enough water. Want to discontinue regardless due to acute injury to kidney. Has she ever been on a drug called amlodipine? Im going to send this in at 5mg /day and then have her come back in a week as we may need to titrate this up. Keep a log of her bP at home as well. Side effects are leg swelling and headache.   2) diabetes is just slightly worse. A1c bumped up to 7.4, which is not unreasonable for her age. We could increase her medication to 5mg /day, but we can discuss in a week when we check her blood pressure.   Would hold surgery until we get her blood pressure sorted over the next few weeks.  So melitta, make sure she is on our schedule in a week for bp check after stopping her dyazide and starting new bp medication-amlodipine!   Thanks!  Dr. Rogers Blocker

## 2020-07-17 NOTE — Telephone Encounter (Signed)
Patient states that she read her lab results from 4/29 and hasnt heard back from anyone and is wondering if someone can go over everything with her.

## 2020-07-17 NOTE — Telephone Encounter (Signed)
Patient is scheduled for Monday at 1, but Ms.Misty Cervantes wants to know if she should go ahead and start the medication Dr.Wolfe recommended or wait until she comes in to see her on Monday. Misty Cervantes was under the impression that Dr.Wolfe wanted her to be seen tomorrow, did explain that Dr.Wolfe was completely full and had no openings.

## 2020-07-17 NOTE — Telephone Encounter (Signed)
Spoke with patient. Advised as seen below per Dr. Talbert Nan.  Patient will f/u with [facility] once cleared by PCP.  Patient aware to call if any concerns.   Call placed to central scheduling, spoke with Maudie Mercury, surgery cancelled.   Routing to provider for final review. Patient is agreeable to disposition. Will close encounter.   Cc: Hayley Carder

## 2020-07-17 NOTE — Telephone Encounter (Signed)
Called pt to let her know that she needs to start medication, so that when she comes in for her appointment she can update Dr. Rogers Blocker on how the medication is working. I explained that there was not a specific day that was requested to be seen. Monday should be fine. Pt agrees and will follow up in office then.

## 2020-07-21 ENCOUNTER — Ambulatory Visit (INDEPENDENT_AMBULATORY_CARE_PROVIDER_SITE_OTHER): Payer: Medicare Other | Admitting: Family Medicine

## 2020-07-21 ENCOUNTER — Other Ambulatory Visit: Payer: Self-pay | Admitting: Obstetrics and Gynecology

## 2020-07-21 ENCOUNTER — Other Ambulatory Visit: Payer: Self-pay

## 2020-07-21 ENCOUNTER — Telehealth: Payer: Self-pay | Admitting: *Deleted

## 2020-07-21 ENCOUNTER — Encounter: Payer: Self-pay | Admitting: Family Medicine

## 2020-07-21 VITALS — BP 124/68 | HR 81 | Temp 98.1°F | Wt 146.2 lb

## 2020-07-21 DIAGNOSIS — N183 Chronic kidney disease, stage 3 unspecified: Secondary | ICD-10-CM

## 2020-07-21 DIAGNOSIS — N179 Acute kidney failure, unspecified: Secondary | ICD-10-CM

## 2020-07-21 DIAGNOSIS — Z1231 Encounter for screening mammogram for malignant neoplasm of breast: Secondary | ICD-10-CM

## 2020-07-21 DIAGNOSIS — I10 Essential (primary) hypertension: Secondary | ICD-10-CM

## 2020-07-21 LAB — COMPREHENSIVE METABOLIC PANEL
ALT: 14 U/L (ref 0–35)
AST: 15 U/L (ref 0–37)
Albumin: 4.2 g/dL (ref 3.5–5.2)
Alkaline Phosphatase: 61 U/L (ref 39–117)
BUN: 18 mg/dL (ref 6–23)
CO2: 29 mEq/L (ref 19–32)
Calcium: 9.2 mg/dL (ref 8.4–10.5)
Chloride: 102 mEq/L (ref 96–112)
Creatinine, Ser: 1.19 mg/dL (ref 0.40–1.20)
GFR: 44.71 mL/min — ABNORMAL LOW (ref >60.00)
Glucose, Bld: 153 mg/dL — ABNORMAL HIGH (ref 70–99)
Potassium: 3.8 mEq/L (ref 3.5–5.1)
Sodium: 138 mEq/L (ref 135–145)
Total Bilirubin: 0.9 mg/dL (ref 0.2–1.2)
Total Protein: 6.8 g/dL (ref 6.0–8.3)

## 2020-07-21 MED ORDER — NONFORMULARY OR COMPOUNDED ITEM: 0 refills | Status: DC

## 2020-07-21 NOTE — Telephone Encounter (Signed)
Pharmacy sent refill requesting for compound estradiol vaginal cream 0.02%,I don't see a annual exam for patient in chart, only problem visit. Please advise

## 2020-07-21 NOTE — Telephone Encounter (Signed)
Her last breast and pelvic exam was in 2/20, she is overdue. She is having gyn surgery in the near future. Please go ahead and refill her estrogen cream X 1. Please ask her to set up her mammogram (overdue). Thanks.

## 2020-07-21 NOTE — Telephone Encounter (Signed)
Patient informed with below note, Rx called in. Patient has pessary check visit tomorrow and will schedule her annual exam. Will call today to schedule mammogram.

## 2020-07-21 NOTE — Patient Instructions (Signed)
Blood pressure looks great today. Will continue your amlodipine 5mg  for now.  -will check your kidney function today. If better you can have surgery!  -ill send a note to dr. Talbert Nan for you!  Praying for you and your surgery! Dr. Rogers Blocker

## 2020-07-21 NOTE — Progress Notes (Signed)
Patient: Misty Cervantes MRN: [id number] DOB: 1945-08-18 PCP: Orma Flaming, MD     Subjective:  Chief Complaint  Patient presents with  . Hypertension  . acute on chronic kidney disease  . Follow-up    HPI: The patient is a 75 y.o. female who presents today for acute on chronic kidney disease, med follow up.  I saw her a couple of weeks ago for pre op clearance and her labs showed her creatinine bumped from baseline of 1.02-1.1 up to 1.5. I stopped her dyazide HTN drug and changed her over to norvasc 5mg  to take off any nephrotoxic drugs. She is here for f/u of her blood pressure and for repeat renal labs. She has only been on this 5 days. Denies any leg swelling or headaches and is tolerating fine. Has not kept any logs of her bp.   Review of Systems  Constitutional: Negative for chills, fatigue and fever.  HENT: Negative for dental problem, ear pain, hearing loss and trouble swallowing.   Eyes: Negative for visual disturbance.  Respiratory: Negative for cough, chest tightness and shortness of breath.   Cardiovascular: Negative for chest pain, palpitations and leg swelling.  Gastrointestinal: Negative for abdominal pain, blood in stool, diarrhea and nausea.  Endocrine: Negative for cold intolerance, polydipsia, polyphagia and polyuria.  Genitourinary: Negative for dysuria and hematuria.  Musculoskeletal: Negative for arthralgias.  Skin: Negative for rash.  Neurological: Negative for dizziness and headaches.  Psychiatric/Behavioral: Negative for dysphoric mood and sleep disturbance. The patient is not nervous/anxious.     Allergies Patient is allergic to probiotic [acidophilus].  Past Medical History Patient  has a past medical history of Anemia, Cataract, Diabetes mellitus without complication ([place]), Gastric ulcer (11/17/2011), History of total left hip replacement (2011), HTN (hypertension), total knee replacement (2017), Hyperlipemia, Hyperplastic polyp of intestine  (11/17/2011), Hypertension, Measles, Menopause, Migraine, and Osteoarthritis.  Surgical History Patient  has a past surgical history that includes Total vaginal hysterectomy (2012); Cataract extraction (Bilateral); Retinal detachment surgery; Hip Arthroplasty (Left, 2011); Joint replacement; Total knee arthroplasty (Right, 11/03/2015); and Carpal tunnel release (Right).  Family History Pateint's family history includes Diabetes in her mother, sister, and sister; Heart attack in her father and mother; Kidney cancer in her father.  Social History Patient  reports that she has never smoked. She has never used smokeless tobacco. She reports that she does not drink alcohol and does not use drugs.    Objective: Vitals:   07/21/20 1258  BP: 124/68  Pulse: 81  Temp: 98.1 F (36.7 C)  SpO2: 98%  Weight: 146 lb 3.2 oz (66.3 kg)    Body mass index is 28.55 kg/m.  Physical Exam Vitals reviewed.  Constitutional:      Appearance: Normal appearance. She is obese.  HENT:     Head: Normocephalic and atraumatic.  Cardiovascular:     Rate and Rhythm: Normal rate and regular rhythm.     Heart sounds: Normal heart sounds.  Pulmonary:     Effort: Pulmonary effort is normal.     Breath sounds: Normal breath sounds.  Abdominal:     General: Bowel sounds are normal.     Palpations: Abdomen is soft.  Musculoskeletal:     Right lower leg: No edema.     Left lower leg: No edema.  Skin:    Capillary Refill: Capillary refill takes less than 2 seconds.  Neurological:     General: No focal deficit present.     Mental Status: She is alert and  oriented to person, place, and time.        Assessment/plan: 1. Primary hypertension To goal on norvasc 5mg /day. Discussed I still want her to check this at home over the next 1-2 weeks. Goal is 130/80 with her diabetes. She is tolerating well. Routine f/u or sooner if bp outside of targeted range.   2. Acute renal failure superimposed on stage 3 chronic  kidney disease, unspecified acute renal failure type, unspecified whether stage 3a or 3b CKD ([place]) Will repeat labs after stopping dyazide. Hopefully returned to baseline.     Return if symptoms worsen or fail to improve.     Orma Flaming, MD [place]  07/21/2020

## 2020-07-22 ENCOUNTER — Encounter: Payer: Self-pay | Admitting: Obstetrics and Gynecology

## 2020-07-22 ENCOUNTER — Ambulatory Visit: Payer: Medicare Other | Admitting: Obstetrics and Gynecology

## 2020-07-22 VITALS — BP 122/74 | HR 88 | Wt 146.0 lb

## 2020-07-22 DIAGNOSIS — Z4689 Encounter for fitting and adjustment of other specified devices: Secondary | ICD-10-CM | POA: Diagnosis not present

## 2020-07-22 DIAGNOSIS — N819 Female genital prolapse, unspecified: Secondary | ICD-10-CM | POA: Diagnosis not present

## 2020-07-22 NOTE — Progress Notes (Signed)
GYNECOLOGY  VISIT   HPI: 75 y.o.   Divorced White or Caucasian Not Hispanic or Latino  female   [phone] with No LMP recorded (lmp unknown). Patient has had a hysterectomy.   here for  Pessary check.   She was scheduled for colpocleisis next week, surgery was put on hold until she had adjustment of her antihypertensive medication and f/u renal testing.  Labs from yesterday show a creatinine of 1.19 (down from 1.5) and GFR of 44.71 (up from 33.87). Primary cleared her for surgery.    GYNECOLOGIC HISTORY: No LMP recorded (lmp unknown). Patient has had a hysterectomy. Contraception: post hysterectomy  Menopausal hormone therapy: estradiol         OB History    Gravida  2   Para  2   Term  2   Preterm      AB      Living  1     SAB      IAB      Ectopic      Multiple      Live Births  2              Patient Active Problem List   Diagnosis Date Noted  . Spinal stenosis of lumbar region 05/13/2020  . Aortic atherosclerosis ([place]) 06/22/2019  . Type 2 diabetes mellitus with hyperglycemia, without long-term current use of insulin ([place]) 06/18/2016  . OA (osteoarthritis) of knee 11/03/2015  . Hyperlipidemia associated with type 2 diabetes mellitus ([place]) 07/23/2014  . Genu valgum 08/11/2012  . Hypertension associated with diabetes ([place]) 02/25/2010    Past Medical History:  Diagnosis Date  . Anemia   . Cataract   . Diabetes mellitus without complication ([place])   . Gastric ulcer 11/17/2011  . History of total left hip replacement 2011  . HTN (hypertension)   . Hx of total knee replacement 2017   11-03-2015   . Hyperlipemia   . Hyperplastic polyp of intestine 11/17/2011  . Hypertension   . Measles   . Menopause   . Migraine   . Osteoarthritis     Past Surgical History:  Procedure Laterality Date  . CARPAL TUNNEL RELEASE Right   . CATARACT EXTRACTION Bilateral   . HIP ARTHROPLASTY Left 2011  . JOINT REPLACEMENT    . RETINAL DETACHMENT SURGERY    . TOTAL  KNEE ARTHROPLASTY Right 11/03/2015   Procedure: RIGHT TOTAL KNEE ARTHROPLASTY;  Surgeon: Gaynelle Arabian, MD;  Location: [facility];  Service: Orthopedics;  Laterality: Right;  . TOTAL VAGINAL HYSTERECTOMY  2012   TVH, anterior repair, TVT (no mesh used for the anterior repair)    Current Outpatient Medications  Medication Sig Dispense Refill  . Accu-Chek FastClix Lancets MISC Check blood sugars 1-2 times per day. 100 each 2  . ACCU-CHEK GUIDE test strip TEST FOUR TIMES DAILY. 100 strip 6  . acetaminophen (TYLENOL) 325 MG tablet Take 650 mg by mouth as needed.    Marland Kitchen albuterol (VENTOLIN HFA) 108 (90 Base) MCG/ACT inhaler TAKE 2 PUFFS BY MOUTH EVERY 6 HOURS AS NEEDED FOR WHEEZE OR SHORTNESS OF BREATH 6.7 each 1  . amLODipine (NORVASC) 5 MG tablet Take 1 tablet (5 mg total) by mouth daily. 30 tablet 0  . Cyanocobalamin (B-12 PO) Take 1 tablet by mouth daily.    . fluticasone (FLONASE) 50 MCG/ACT nasal spray PLACE 1 SPRAY INTO BOTH NOSTRILS 2 (TWO) TIMES DAILY AS NEEDED FOR ALLERGIES OR RHINITIS. 48 mL 0  . glipiZIDE (GLUCOTROL XL)  2.5 MG 24 hr tablet Take 1 tablet (2.5 mg total) by mouth daily with breakfast. 90 tablet 1  . meclizine (ANTIVERT) 25 MG tablet TAKE 1 TABLET (25 MG TOTAL) BY MOUTH 3 (THREE) TIMES DAILY AS NEEDED FOR DIZZINESS. 60 tablet 1  . Multiple Vitamin (MULTIVITAMIN WITH MINERALS) TABS tablet Take 1 tablet by mouth daily.    . NONFORMULARY OR COMPOUNDED ITEM Estradiol cream 0.02%, use 1/2 a gram vaginally 2 x a week at hs. 1 each 0  . pravastatin (PRAVACHOL) 40 MG tablet Take 1 tablet (40 mg total) by mouth daily. 90 tablet 0  . traZODone (DESYREL) 50 MG tablet TAKE 1/2 TO 1 TABLET BY MOUTH AT BEDTIME AS NEEDED FOR SLEEP 90 tablet 2  . triamcinolone cream (KENALOG) 0.1 % Apply 1 application topically 4 (four) times daily as needed (bug bites).     No current facility-administered medications for this visit.     ALLERGIES: Probiotic [acidophilus]  Family History  Problem Relation  Age of Onset  . Diabetes Mother        deceased  . Heart attack Mother   . Diabetes Sister        x2  . Kidney cancer Father   . Heart attack Father   . Diabetes Sister     Social History   Socioeconomic History  . Marital status: Divorced    Spouse name: Not on file  . Number of children: Not on file  . Years of education: Not on file  . Highest education level: Not on file  Occupational History  . Occupation: retired    Fish farm manager: RETIRED  Tobacco Use  . Smoking status: Never Smoker  . Smokeless tobacco: Never Used  Vaping Use  . Vaping Use: Never used  Substance and Sexual Activity  . Alcohol use: No  . Drug use: No  . Sexual activity: Not Currently    Partners: Male    Birth control/protection: Surgical    Comment: hysterectomy  Other Topics Concern  . Not on file  Social History Narrative   Divorced, 2 children; retired.    Custody of grandchildren    Social Determinants of Health   Financial Resource Strain: Low Risk   . Difficulty of Paying Living Expenses: Not hard at all  Food Insecurity: No Food Insecurity  . Worried About Charity fundraiser in the Last Year: Never true  . Ran Out of Food in the Last Year: Never true  Transportation Needs: No Transportation Needs  . Lack of Transportation (Medical): No  . Lack of Transportation (Non-Medical): No  Physical Activity: Inactive  . Days of Exercise per Week: 0 days  . Minutes of Exercise per Session: 0 min  Stress: Stress Concern Present  . Feeling of Stress : To some extent  Social Connections: Socially Isolated  . Frequency of Communication with Friends and Family: More than three times a week  . Frequency of Social Gatherings with Friends and Family: More than three times a week  . Attends Religious Services: Never  . Active Member of Clubs or Organizations: No  . Attends Archivist Meetings: Never  . Marital Status: Divorced  Human resources officer Violence: Not At Risk  . Fear of Current or  Ex-Partner: No  . Emotionally Abused: No  . Physically Abused: No  . Sexually Abused: No    Review of Systems  All other systems reviewed and are negative.   PHYSICAL EXAMINATION:    BP 122/74   Pulse  88   Wt 146 lb (66.2 kg)   LMP  (LMP Unknown)   SpO2 99%   BMI 28.51 kg/m     General appearance: alert, cooperative and appears stated age   Pelvic: External genitalia:  no lesions              Urethra:  normal appearing urethra with no masses, tenderness or lesions              Bartholins and Skenes: normal                 Vagina: the pessary was removed and cleaned. No significant vaginal irritation. Pessary replaced.               Cervix: absent  Chaperone was present for exam.  1. Pessary maintenance Vagina has healed from prior irriation  2. Vaginal vault prolapse Will reschedule her colpocleisis surgery.

## 2020-07-25 ENCOUNTER — Other Ambulatory Visit (HOSPITAL_COMMUNITY): Payer: Medicare Other

## 2020-07-29 ENCOUNTER — Ambulatory Visit (HOSPITAL_BASED_OUTPATIENT_CLINIC_OR_DEPARTMENT_OTHER): Admit: 2020-07-29 | Payer: Medicare Other | Admitting: Obstetrics and Gynecology

## 2020-07-29 ENCOUNTER — Encounter (HOSPITAL_BASED_OUTPATIENT_CLINIC_OR_DEPARTMENT_OTHER): Payer: Self-pay

## 2020-07-29 SURGERY — COLPOCLEISIS
Anesthesia: Choice

## 2020-07-30 ENCOUNTER — Telehealth: Payer: Self-pay | Admitting: *Deleted

## 2020-07-30 NOTE — Telephone Encounter (Signed)
Call placed to patient, reviewed surgery dates. Patient request to proceed with surgery in 2 weeks, she is in the process of moving. Reviewed dates, will proceed with r/s surgery on 08/18/20. Advised patient I will return call once date and time confirmed. Patient agreeable.   Dr. Henri Medal was seen in office on 07/22/20 for pessary maintenance, will she need another visit for pre-op?  Surgery on 08/18/20.   Cc: KimAlexis

## 2020-07-30 NOTE — Telephone Encounter (Signed)
-----   Message from Salvadore Dom, MD sent at 06/26/2020  5:19 PM EDT ----- Surgery: colpocleisis, cystoscopy  Diagnosis: vaginal vault prolapse  Location: [place]  Status: Outpatient  Time: 17 Minutes  Assistant: None  Urgency: At Patient's Convenience  Pre-Op Appointment: To Be Scheduled  Post-Op Appointment(s): 1 Week, 4 Weeks  Time Out Of Work: NA  She needs preop visit clearance with her primary.

## 2020-07-31 NOTE — Telephone Encounter (Signed)
Spoke with patient. Surgery date request confirmed.  Advised surgery is scheduled for 08/18/20, [facility] at 0730. Surgery instruction sheet and hospital brochure reviewed, printed copy will be mailed. Patient advised of Covid screening and quarantine requirements and agreeable.  Pre-op visit on 08/12/20.   Advised patient I will need to review post-op f/u with Dr. Talbert Nan and f/u. Patient agreeable and request appt details be left on voicemail if no answer.

## 2020-07-31 NOTE — Telephone Encounter (Signed)
Call to patient x2. Call picks up and disconnects. Will try again at a later time.

## 2020-07-31 NOTE — Telephone Encounter (Signed)
Call returned to patient, left detailed message advising of post op appts. On 6/20 at 4:15pm and 7/6 at 4:30pm. Return call to office if any additional questions.   Routing to provider for final review. Patient is agreeable to disposition. Will close encounter.

## 2020-08-03 ENCOUNTER — Other Ambulatory Visit: Payer: Self-pay | Admitting: Family Medicine

## 2020-08-03 MED ORDER — GLIPIZIDE ER 5 MG PO TB24: 5.0000 mg | ORAL_TABLET | Freq: Every day | ORAL | 1 refills | Status: DC

## 2020-08-09 ENCOUNTER — Other Ambulatory Visit: Payer: Self-pay | Admitting: Family Medicine

## 2020-08-12 ENCOUNTER — Telehealth: Payer: Self-pay

## 2020-08-12 ENCOUNTER — Other Ambulatory Visit: Payer: Self-pay

## 2020-08-12 ENCOUNTER — Ambulatory Visit: Payer: Medicare Other | Admitting: Obstetrics and Gynecology

## 2020-08-12 ENCOUNTER — Encounter: Payer: Self-pay | Admitting: Obstetrics and Gynecology

## 2020-08-12 VITALS — BP 120/72 | HR 108 | Ht 58.5 in | Wt 138.8 lb

## 2020-08-12 DIAGNOSIS — E1165 Type 2 diabetes mellitus with hyperglycemia: Secondary | ICD-10-CM

## 2020-08-12 DIAGNOSIS — E785 Hyperlipidemia, unspecified: Secondary | ICD-10-CM

## 2020-08-12 DIAGNOSIS — N1831 Chronic kidney disease, stage 3a: Secondary | ICD-10-CM | POA: Insufficient documentation

## 2020-08-12 DIAGNOSIS — I152 Hypertension secondary to endocrine disorders: Secondary | ICD-10-CM

## 2020-08-12 DIAGNOSIS — E1159 Type 2 diabetes mellitus with other circulatory complications: Secondary | ICD-10-CM

## 2020-08-12 DIAGNOSIS — N819 Female genital prolapse, unspecified: Secondary | ICD-10-CM

## 2020-08-12 DIAGNOSIS — N183 Chronic kidney disease, stage 3 unspecified: Secondary | ICD-10-CM

## 2020-08-12 DIAGNOSIS — N189 Chronic kidney disease, unspecified: Secondary | ICD-10-CM | POA: Insufficient documentation

## 2020-08-12 DIAGNOSIS — I700 Atherosclerosis of aorta: Secondary | ICD-10-CM

## 2020-08-12 DIAGNOSIS — E1169 Type 2 diabetes mellitus with other specified complication: Secondary | ICD-10-CM

## 2020-08-12 NOTE — Telephone Encounter (Signed)
Per Dr. Talbert Nan  Dr Rogers Blocker had changed her from dyazide to norvasc. The patient stopped developed significant swelling in her legs and switched back. She has sent a message to Dr Rogers Blocker, waiting for a response

## 2020-08-12 NOTE — H&P (View-Only) (Signed)
GYNECOLOGY  VISIT   HPI: 75 y.o.   Divorced White or Caucasian Not Hispanic or Latino  female   [phone] with No LMP recorded (lmp unknown). Patient has had a hysterectomy.   here for a preoperative visit. She has a grade 3-4 vaginal vault prolapse. Her prolapse has been helped with the cube pessary, but she has recurring issues with vaginal irritation from the pessary.    She has a h/o a TVH and TVT with anterior repair. She doesn't have any issues with incontinence. She voids well with the pessary in.   She has a h/o DM, recent HgbA1C was 7.4%. She has a h/o HTN and has had some recent changes in her medication secondary to abnormal renal function. Her last creatinine was 1.5 mg/dl and her GFR was 33.87. She has had preoperative clearance with her primary MD.  Dr Rogers Blocker had changed her from dyazide to norvasc. The patient stopped developed significant swelling in her legs and switched back. She has sent a message to Dr Rogers Blocker, waiting for a response.   GYNECOLOGIC HISTORY: No LMP recorded (lmp unknown). Patient has had a hysterectomy. Contraception:Postmenopausal Menopausal hormone therapy: None, uses estradiol 0.02% vaginal cream.         OB History    Gravida  2   Para  2   Term  2   Preterm      AB      Living  1     SAB      IAB      Ectopic      Multiple      Live Births  2              Patient Active Problem List   Diagnosis Date Noted  . Chronic kidney disease   . Spinal stenosis of lumbar region 05/13/2020  . Aortic atherosclerosis ([place]) 06/22/2019  . Type 2 diabetes mellitus with hyperglycemia, without long-term current use of insulin ([place]) 06/18/2016  . OA (osteoarthritis) of knee 11/03/2015  . Hyperlipidemia associated with type 2 diabetes mellitus ([place]) 07/23/2014  . Genu valgum 08/11/2012  . Hypertension associated with diabetes ([place]) 02/25/2010    Past Medical History:  Diagnosis Date  . Anemia   . Cataract   . Chronic kidney disease   .  Diabetes mellitus without complication ([place])   . Gastric ulcer 11/17/2011  . History of total left hip replacement 2011  . HTN (hypertension)   . Hx of total knee replacement 2017   11-03-2015   . Hyperlipemia   . Hyperplastic polyp of intestine 11/17/2011  . Hypertension   . Measles   . Menopause   . Migraine   . Osteoarthritis     Past Surgical History:  Procedure Laterality Date  . CARPAL TUNNEL RELEASE Right   . CATARACT EXTRACTION Bilateral   . HIP ARTHROPLASTY Left 2011  . JOINT REPLACEMENT    . RETINAL DETACHMENT SURGERY    . TOTAL KNEE ARTHROPLASTY Right 11/03/2015   Procedure: RIGHT TOTAL KNEE ARTHROPLASTY;  Surgeon: Gaynelle Arabian, MD;  Location: [facility];  Service: Orthopedics;  Laterality: Right;  . TOTAL VAGINAL HYSTERECTOMY  2012   TVH, anterior repair, TVT (no mesh used for the anterior repair)    Current Outpatient Medications  Medication Sig Dispense Refill  . Accu-Chek FastClix Lancets MISC Check blood sugars 1-2 times per day. 100 each 2  . ACCU-CHEK GUIDE test strip TEST FOUR TIMES DAILY. 100 strip 6  . acetaminophen (TYLENOL) 325 MG  tablet Take 650 mg by mouth as needed.    Marland Kitchen albuterol (VENTOLIN HFA) 108 (90 Base) MCG/ACT inhaler TAKE 2 PUFFS BY MOUTH EVERY 6 HOURS AS NEEDED FOR WHEEZE OR SHORTNESS OF BREATH 6.7 each 1  . Cyanocobalamin (B-12 PO) Take 1 tablet by mouth daily.    . fluticasone (FLONASE) 50 MCG/ACT nasal spray PLACE 1 SPRAY INTO BOTH NOSTRILS 2 (TWO) TIMES DAILY AS NEEDED FOR ALLERGIES OR RHINITIS. 48 mL 0  . glipiZIDE (GLUCOTROL XL) 5 MG 24 hr tablet Take 1 tablet (5 mg total) by mouth daily with breakfast. 90 tablet 1  . meclizine (ANTIVERT) 25 MG tablet TAKE 1 TABLET (25 MG TOTAL) BY MOUTH 3 (THREE) TIMES DAILY AS NEEDED FOR DIZZINESS. 60 tablet 1  . Multiple Vitamin (MULTIVITAMIN WITH MINERALS) TABS tablet Take 1 tablet by mouth daily.    . NONFORMULARY OR COMPOUNDED ITEM Estradiol cream 0.02%, use 1/2 a gram vaginally 2 x a week at hs. 1 each  0  . pravastatin (PRAVACHOL) 40 MG tablet Take 1 tablet (40 mg total) by mouth daily. 90 tablet 0  . triamcinolone cream (KENALOG) 0.1 % Apply 1 application topically 4 (four) times daily as needed (bug bites).    . triamterene-hydrochlorothiazide (DYAZIDE) 37.5-25 MG capsule Take 1 capsule by mouth daily.    Marland Kitchen amLODipine (NORVASC) 5 MG tablet Take 1 tablet (5 mg total) by mouth daily. (Patient not taking: Reported on 08/12/2020) 30 tablet 0  . traZODone (DESYREL) 50 MG tablet TAKE 1/2 TO 1 TABLET BY MOUTH AT BEDTIME AS NEEDED FOR SLEEP (Patient not taking: Reported on 08/12/2020) 90 tablet 2   No current facility-administered medications for this visit.     ALLERGIES: Probiotic [acidophilus]  Family History  Problem Relation Age of Onset  . Diabetes Mother        deceased  . Heart attack Mother   . Diabetes Sister        x2  . Kidney cancer Father   . Heart attack Father   . Diabetes Sister     Social History   Socioeconomic History  . Marital status: Divorced    Spouse name: Not on file  . Number of children: Not on file  . Years of education: Not on file  . Highest education level: Not on file  Occupational History  . Occupation: retired    Fish farm manager: RETIRED  Tobacco Use  . Smoking status: Never Smoker  . Smokeless tobacco: Never Used  Vaping Use  . Vaping Use: Never used  Substance and Sexual Activity  . Alcohol use: No  . Drug use: No  . Sexual activity: Not Currently    Partners: Male    Birth control/protection: Surgical    Comment: hysterectomy  Other Topics Concern  . Not on file  Social History Narrative   Divorced, 2 children; retired.    Custody of grandchildren    Social Determinants of Health   Financial Resource Strain: Low Risk   . Difficulty of Paying Living Expenses: Not hard at all  Food Insecurity: No Food Insecurity  . Worried About Charity fundraiser in the Last Year: Never true  . Ran Out of Food in the Last Year: Never true   Transportation Needs: No Transportation Needs  . Lack of Transportation (Medical): No  . Lack of Transportation (Non-Medical): No  Physical Activity: Inactive  . Days of Exercise per Week: 0 days  . Minutes of Exercise per Session: 0 min  Stress: Stress Concern  Present  . Feeling of Stress : To some extent  Social Connections: Socially Isolated  . Frequency of Communication with Friends and Family: More than three times a week  . Frequency of Social Gatherings with Friends and Family: More than three times a week  . Attends Religious Services: Never  . Active Member of Clubs or Organizations: No  . Attends Archivist Meetings: Never  . Marital Status: Divorced  Human resources officer Violence: Not At Risk  . Fear of Current or Ex-Partner: No  . Emotionally Abused: No  . Physically Abused: No  . Sexually Abused: No    ROS  PHYSICAL EXAMINATION:    BP 120/72   Pulse (!) 108   Ht 4' 10.5" (1.486 m)   Wt 138 lb 12.8 oz (63 kg)   LMP  (LMP Unknown)   SpO2 98%   BMI 28.52 kg/m     General appearance: alert, cooperative and appears stated age Neck: no adenopathy, supple, symmetrical, trachea midline and thyroid normal to inspection and palpation Heart: regular rate and rhythm Lungs: CTAB Abdomen: soft, non-tender; bowel sounds normal; no masses,  no organomegaly Extremities: normal, atraumatic, no cyanosis Skin: normal color, texture and turgor, no rashes or lesions Lymph: normal cervical supraclavicular and inguinal nodes Neurologic: grossly normal Pelvic: deferred for today. Exam on 04/08/20 (after the pessary had been out for a month) showed a grade 4 cystocele and a grade 3-4 vault prolapse (with standing and valsalva).   1. Vaginal vault prolapse Recurring issues of vaginal irritation from different pessaries. Given the degree of apical prolapse we discussed that her best options for long term success are sacrocolpopexy or colpocleisis. She hasn't been sexually active  since her early 70's and has no desire to be sexually active in the future. She would like to proceed with colpocleisis. She has no incontinence and has a h/o a TVT.   2. Stage 3 chronic kidney disease, unspecified whether stage 3a or 3b CKD ([place])   3. Type 2 diabetes mellitus with hyperglycemia, without long-term current use of insulin ([facility]) Hgb A1C 7.4%  4. Aortic atherosclerosis ([place])   5. Hyperlipidemia associated with type 2 diabetes mellitus ([place]) On medication  6. Hypertension associated with diabetes ([place]) Patient was changed from dyazide to norvasc secondary to changes in renal function. She changed back a week ago secondary to bad swelling in her feet. Will reach out to her primary care provider.  She had been cleared for surgery.

## 2020-08-12 NOTE — Telephone Encounter (Signed)
Patient is requesting a  Call back regarding BP medication adjustment for surgery

## 2020-08-12 NOTE — Progress Notes (Signed)
GYNECOLOGY  VISIT   HPI: 75 y.o.   Divorced White or Caucasian Not Hispanic or Latino  female   [phone] with No LMP recorded (lmp unknown). Patient has had a hysterectomy.   here for a preoperative visit. She has a grade 3-4 vaginal vault prolapse. Her prolapse has been helped with the cube pessary, but she has recurring issues with vaginal irritation from the pessary.    She has a h/o a TVH and TVT with anterior repair. She doesn't have any issues with incontinence. She voids well with the pessary in.   She has a h/o DM, recent HgbA1C was 7.4%. She has a h/o HTN and has had some recent changes in her medication secondary to abnormal renal function. Her last creatinine was 1.5 mg/dl and her GFR was 33.87. She has had preoperative clearance with her primary MD.  Dr Rogers Blocker had changed her from dyazide to norvasc. The patient stopped developed significant swelling in her legs and switched back. She has sent a message to Dr Rogers Blocker, waiting for a response.   GYNECOLOGIC HISTORY: No LMP recorded (lmp unknown). Patient has had a hysterectomy. Contraception:Postmenopausal Menopausal hormone therapy: None, uses estradiol 0.02% vaginal cream.         OB History    Gravida  2   Para  2   Term  2   Preterm      AB      Living  1     SAB      IAB      Ectopic      Multiple      Live Births  2              Patient Active Problem List   Diagnosis Date Noted  . Chronic kidney disease   . Spinal stenosis of lumbar region 05/13/2020  . Aortic atherosclerosis ([place]) 06/22/2019  . Type 2 diabetes mellitus with hyperglycemia, without long-term current use of insulin ([place]) 06/18/2016  . OA (osteoarthritis) of knee 11/03/2015  . Hyperlipidemia associated with type 2 diabetes mellitus ([place]) 07/23/2014  . Genu valgum 08/11/2012  . Hypertension associated with diabetes ([place]) 02/25/2010    Past Medical History:  Diagnosis Date  . Anemia   . Cataract   . Chronic kidney disease   .  Diabetes mellitus without complication ([place])   . Gastric ulcer 11/17/2011  . History of total left hip replacement 2011  . HTN (hypertension)   . Hx of total knee replacement 2017   11-03-2015   . Hyperlipemia   . Hyperplastic polyp of intestine 11/17/2011  . Hypertension   . Measles   . Menopause   . Migraine   . Osteoarthritis     Past Surgical History:  Procedure Laterality Date  . CARPAL TUNNEL RELEASE Right   . CATARACT EXTRACTION Bilateral   . HIP ARTHROPLASTY Left 2011  . JOINT REPLACEMENT    . RETINAL DETACHMENT SURGERY    . TOTAL KNEE ARTHROPLASTY Right 11/03/2015   Procedure: RIGHT TOTAL KNEE ARTHROPLASTY;  Surgeon: Gaynelle Arabian, MD;  Location: [facility];  Service: Orthopedics;  Laterality: Right;  . TOTAL VAGINAL HYSTERECTOMY  2012   TVH, anterior repair, TVT (no mesh used for the anterior repair)    Current Outpatient Medications  Medication Sig Dispense Refill  . Accu-Chek FastClix Lancets MISC Check blood sugars 1-2 times per day. 100 each 2  . ACCU-CHEK GUIDE test strip TEST FOUR TIMES DAILY. 100 strip 6  . acetaminophen (TYLENOL) 325 MG  tablet Take 650 mg by mouth as needed.    Marland Kitchen albuterol (VENTOLIN HFA) 108 (90 Base) MCG/ACT inhaler TAKE 2 PUFFS BY MOUTH EVERY 6 HOURS AS NEEDED FOR WHEEZE OR SHORTNESS OF BREATH 6.7 each 1  . Cyanocobalamin (B-12 PO) Take 1 tablet by mouth daily.    . fluticasone (FLONASE) 50 MCG/ACT nasal spray PLACE 1 SPRAY INTO BOTH NOSTRILS 2 (TWO) TIMES DAILY AS NEEDED FOR ALLERGIES OR RHINITIS. 48 mL 0  . glipiZIDE (GLUCOTROL XL) 5 MG 24 hr tablet Take 1 tablet (5 mg total) by mouth daily with breakfast. 90 tablet 1  . meclizine (ANTIVERT) 25 MG tablet TAKE 1 TABLET (25 MG TOTAL) BY MOUTH 3 (THREE) TIMES DAILY AS NEEDED FOR DIZZINESS. 60 tablet 1  . Multiple Vitamin (MULTIVITAMIN WITH MINERALS) TABS tablet Take 1 tablet by mouth daily.    . NONFORMULARY OR COMPOUNDED ITEM Estradiol cream 0.02%, use 1/2 a gram vaginally 2 x a week at hs. 1 each  0  . pravastatin (PRAVACHOL) 40 MG tablet Take 1 tablet (40 mg total) by mouth daily. 90 tablet 0  . triamcinolone cream (KENALOG) 0.1 % Apply 1 application topically 4 (four) times daily as needed (bug bites).    . triamterene-hydrochlorothiazide (DYAZIDE) 37.5-25 MG capsule Take 1 capsule by mouth daily.    Marland Kitchen amLODipine (NORVASC) 5 MG tablet Take 1 tablet (5 mg total) by mouth daily. (Patient not taking: Reported on 08/12/2020) 30 tablet 0  . traZODone (DESYREL) 50 MG tablet TAKE 1/2 TO 1 TABLET BY MOUTH AT BEDTIME AS NEEDED FOR SLEEP (Patient not taking: Reported on 08/12/2020) 90 tablet 2   No current facility-administered medications for this visit.     ALLERGIES: Probiotic [acidophilus]  Family History  Problem Relation Age of Onset  . Diabetes Mother        deceased  . Heart attack Mother   . Diabetes Sister        x2  . Kidney cancer Father   . Heart attack Father   . Diabetes Sister     Social History   Socioeconomic History  . Marital status: Divorced    Spouse name: Not on file  . Number of children: Not on file  . Years of education: Not on file  . Highest education level: Not on file  Occupational History  . Occupation: retired    Fish farm manager: RETIRED  Tobacco Use  . Smoking status: Never Smoker  . Smokeless tobacco: Never Used  Vaping Use  . Vaping Use: Never used  Substance and Sexual Activity  . Alcohol use: No  . Drug use: No  . Sexual activity: Not Currently    Partners: Male    Birth control/protection: Surgical    Comment: hysterectomy  Other Topics Concern  . Not on file  Social History Narrative   Divorced, 2 children; retired.    Custody of grandchildren    Social Determinants of Health   Financial Resource Strain: Low Risk   . Difficulty of Paying Living Expenses: Not hard at all  Food Insecurity: No Food Insecurity  . Worried About Charity fundraiser in the Last Year: Never true  . Ran Out of Food in the Last Year: Never true   Transportation Needs: No Transportation Needs  . Lack of Transportation (Medical): No  . Lack of Transportation (Non-Medical): No  Physical Activity: Inactive  . Days of Exercise per Week: 0 days  . Minutes of Exercise per Session: 0 min  Stress: Stress Concern  Present  . Feeling of Stress : To some extent  Social Connections: Socially Isolated  . Frequency of Communication with Friends and Family: More than three times a week  . Frequency of Social Gatherings with Friends and Family: More than three times a week  . Attends Religious Services: Never  . Active Member of Clubs or Organizations: No  . Attends Archivist Meetings: Never  . Marital Status: Divorced  Human resources officer Violence: Not At Risk  . Fear of Current or Ex-Partner: No  . Emotionally Abused: No  . Physically Abused: No  . Sexually Abused: No    ROS  PHYSICAL EXAMINATION:    BP 120/72   Pulse (!) 108   Ht 4' 10.5" (1.486 m)   Wt 138 lb 12.8 oz (63 kg)   LMP  (LMP Unknown)   SpO2 98%   BMI 28.52 kg/m     General appearance: alert, cooperative and appears stated age Neck: no adenopathy, supple, symmetrical, trachea midline and thyroid normal to inspection and palpation Heart: regular rate and rhythm Lungs: CTAB Abdomen: soft, non-tender; bowel sounds normal; no masses,  no organomegaly Extremities: normal, atraumatic, no cyanosis Skin: normal color, texture and turgor, no rashes or lesions Lymph: normal cervical supraclavicular and inguinal nodes Neurologic: grossly normal Pelvic: deferred for today. Exam on 04/08/20 (after the pessary had been out for a month) showed a grade 4 cystocele and a grade 3-4 vault prolapse (with standing and valsalva).   1. Vaginal vault prolapse Recurring issues of vaginal irritation from different pessaries. Given the degree of apical prolapse we discussed that her best options for long term success are sacrocolpopexy or colpocleisis. She hasn't been sexually active  since her early 25's and has no desire to be sexually active in the future. She would like to proceed with colpocleisis. She has no incontinence and has a h/o a TVT.   2. Stage 3 chronic kidney disease, unspecified whether stage 3a or 3b CKD ([place])   3. Type 2 diabetes mellitus with hyperglycemia, without long-term current use of insulin ([facility]) Hgb A1C 7.4%  4. Aortic atherosclerosis ([place])   5. Hyperlipidemia associated with type 2 diabetes mellitus ([place]) On medication  6. Hypertension associated with diabetes ([place]) Patient was changed from dyazide to norvasc secondary to changes in renal function. She changed back a week ago secondary to bad swelling in her feet. Will reach out to her primary care provider.  She had been cleared for surgery.

## 2020-08-12 NOTE — Telephone Encounter (Signed)
I can't see her mychart correspondence. As long as patient BP is well controlled at 140/90 or lower then it is fine for her to stay on her old medication. She needs to have a TOC visit set up if she has not done so already.  Algis Greenhouse. Jerline Pain, MD 08/12/2020 3:51 PM

## 2020-08-12 NOTE — Telephone Encounter (Signed)
Spoke with patient, stated BP running  under 140/90 Will schedule TOC  appointment soon

## 2020-08-13 ENCOUNTER — Telehealth: Payer: Self-pay | Admitting: *Deleted

## 2020-08-13 NOTE — Telephone Encounter (Signed)
Spoke with patient regarding surgery benefits. Patient acknowledges understanding of information presented. Patient is aware that benefits presented are professional benefits only. Patient is aware that once surgery is scheduled, the hospital will call with separate benefits. See account note.

## 2020-08-13 NOTE — Telephone Encounter (Signed)
Call placed to patient. Advised will need to reschedule surgery scheduled for 08/18/20 due to provider emergency. Offered to reschedule for 6/7, patient declined, request a later date.   Surgery rescheduled to 09/02/20 at [facility], [place]. Patient does not need another Covid test or preop appt.   Patient verbalizes understanding and is agreeable.   Routing to provider for final review. Patient is agreeable to disposition. Will close encounter.  Cc: KimAlexis

## 2020-08-14 ENCOUNTER — Other Ambulatory Visit: Payer: Self-pay | Admitting: Family Medicine

## 2020-08-26 ENCOUNTER — Other Ambulatory Visit: Payer: Self-pay

## 2020-08-26 ENCOUNTER — Encounter (HOSPITAL_BASED_OUTPATIENT_CLINIC_OR_DEPARTMENT_OTHER): Payer: Self-pay | Admitting: Obstetrics and Gynecology

## 2020-08-26 NOTE — Progress Notes (Signed)
Spoke w/ via phone for pre-op interview--- Pt Lab needs dos----  no             Lab results------ pt getting CBC, CMP, T&S done 08-29-2020 @ 1145 COVID test -----patient states asymptomatic no test needed Arrive at -------  0530 on 09-02-2020 NPO after MN NO Solid Food.  Clear liquids from MN until--- 0430 Med rec completed Medications to take morning of surgery ----- Pravastatin Diabetic medication ----- do not take glipizide morning of surgery Patient instructed no nail polish to be worn day of surgery Patient instructed to bring photo id and insurance card day of surgery Patient aware to have Driver (ride ) / caregiver for 24 hours after surgery -- son, Merry Proud Patient Special Instructions ----- n/a Pre-Op special Istructions ----- n/a Patient verbalized understanding of instructions that were given at this phone interview. Patient denies shortness of breath, chest pain, fever, cough at this phone interview.

## 2020-08-29 ENCOUNTER — Encounter (HOSPITAL_COMMUNITY)
Admission: RE | Admit: 2020-08-29 | Discharge: 2020-08-29 | Disposition: A | Discharge status: Home / Self Care | Payer: Medicare Other | Source: Ambulatory Visit | Attending: Obstetrics and Gynecology | Admitting: Obstetrics and Gynecology

## 2020-08-29 ENCOUNTER — Other Ambulatory Visit: Payer: Self-pay

## 2020-08-29 DIAGNOSIS — Z01812 Encounter for preprocedural laboratory examination: Secondary | ICD-10-CM | POA: Diagnosis not present

## 2020-08-29 LAB — COMPREHENSIVE METABOLIC PANEL
ALT: 23 U/L (ref 0–44)
AST: 24 U/L (ref 15–41)
Albumin: 4.2 g/dL (ref 3.5–5.0)
Alkaline Phosphatase: 75 U/L (ref 38–126)
Anion gap: 7 (ref 5–15)
BUN: 20 mg/dL (ref 8–23)
CO2: 29 mmol/L (ref 22–32)
Calcium: 9.2 mg/dL (ref 8.9–10.3)
Chloride: 103 mmol/L (ref 98–111)
Creatinine, Ser: 1.22 mg/dL — ABNORMAL HIGH (ref 0.44–1.00)
GFR, Estimated: 46 mL/min — ABNORMAL LOW (ref >60)
Glucose, Bld: 141 mg/dL — ABNORMAL HIGH (ref 70–99)
Potassium: 3.7 mmol/L (ref 3.5–5.1)
Sodium: 139 mmol/L (ref 135–145)
Total Bilirubin: 0.7 mg/dL (ref 0.3–1.2)
Total Protein: 7.5 g/dL (ref 6.5–8.1)

## 2020-08-29 LAB — CBC
HCT: 41.4 % (ref 36.0–46.0)
Hemoglobin: 13.5 g/dL (ref 12.0–15.0)
MCH: 31.4 pg (ref 26.0–34.0)
MCHC: 32.6 g/dL (ref 30.0–36.0)
MCV: 96.3 fL (ref 80.0–100.0)
Platelets: 255 10*3/uL (ref 150–400)
RBC: 4.3 MIL/uL (ref 3.87–5.11)
RDW: 12.6 % (ref 11.5–15.5)
WBC: 5.7 10*3/uL (ref 4.0–10.5)
nRBC: 0 % (ref 0.0–0.2)

## 2020-09-01 ENCOUNTER — Ambulatory Visit: Payer: Self-pay | Admitting: Obstetrics and Gynecology

## 2020-09-02 ENCOUNTER — Encounter (HOSPITAL_BASED_OUTPATIENT_CLINIC_OR_DEPARTMENT_OTHER): Payer: Self-pay | Admitting: Obstetrics and Gynecology

## 2020-09-02 ENCOUNTER — Ambulatory Visit (HOSPITAL_BASED_OUTPATIENT_CLINIC_OR_DEPARTMENT_OTHER): Payer: Medicare Other | Admitting: Anesthesiology

## 2020-09-02 ENCOUNTER — Ambulatory Visit (HOSPITAL_BASED_OUTPATIENT_CLINIC_OR_DEPARTMENT_OTHER)
Admission: RE | Admit: 2020-09-02 | Discharge: 2020-09-02 | Disposition: A | Discharge status: Home / Self Care | Payer: Medicare Other | Attending: Obstetrics and Gynecology | Admitting: Obstetrics and Gynecology

## 2020-09-02 ENCOUNTER — Other Ambulatory Visit: Payer: Self-pay

## 2020-09-02 ENCOUNTER — Encounter (HOSPITAL_BASED_OUTPATIENT_CLINIC_OR_DEPARTMENT_OTHER)
Admission: RE | Disposition: A | Discharge status: Home / Self Care | Payer: Self-pay | Source: Home / Self Care | Attending: Obstetrics and Gynecology

## 2020-09-02 DIAGNOSIS — I129 Hypertensive chronic kidney disease with stage 1 through stage 4 chronic kidney disease, or unspecified chronic kidney disease: Secondary | ICD-10-CM | POA: Insufficient documentation

## 2020-09-02 DIAGNOSIS — E1165 Type 2 diabetes mellitus with hyperglycemia: Secondary | ICD-10-CM | POA: Insufficient documentation

## 2020-09-02 DIAGNOSIS — Z7984 Long term (current) use of oral hypoglycemic drugs: Secondary | ICD-10-CM | POA: Diagnosis not present

## 2020-09-02 DIAGNOSIS — E1136 Type 2 diabetes mellitus with diabetic cataract: Secondary | ICD-10-CM | POA: Insufficient documentation

## 2020-09-02 DIAGNOSIS — N8110 Cystocele, unspecified: Secondary | ICD-10-CM | POA: Diagnosis not present

## 2020-09-02 DIAGNOSIS — E1159 Type 2 diabetes mellitus with other circulatory complications: Secondary | ICD-10-CM | POA: Insufficient documentation

## 2020-09-02 DIAGNOSIS — M48061 Spinal stenosis, lumbar region without neurogenic claudication: Secondary | ICD-10-CM | POA: Diagnosis not present

## 2020-09-02 DIAGNOSIS — I700 Atherosclerosis of aorta: Secondary | ICD-10-CM | POA: Insufficient documentation

## 2020-09-02 DIAGNOSIS — Z90710 Acquired absence of both cervix and uterus: Secondary | ICD-10-CM | POA: Diagnosis not present

## 2020-09-02 DIAGNOSIS — Z8249 Family history of ischemic heart disease and other diseases of the circulatory system: Secondary | ICD-10-CM | POA: Insufficient documentation

## 2020-09-02 DIAGNOSIS — Z79899 Other long term (current) drug therapy: Secondary | ICD-10-CM | POA: Diagnosis not present

## 2020-09-02 DIAGNOSIS — Z96651 Presence of right artificial knee joint: Secondary | ICD-10-CM | POA: Diagnosis not present

## 2020-09-02 DIAGNOSIS — Z833 Family history of diabetes mellitus: Secondary | ICD-10-CM | POA: Insufficient documentation

## 2020-09-02 DIAGNOSIS — E785 Hyperlipidemia, unspecified: Secondary | ICD-10-CM | POA: Diagnosis not present

## 2020-09-02 DIAGNOSIS — N183 Chronic kidney disease, stage 3 unspecified: Secondary | ICD-10-CM | POA: Diagnosis not present

## 2020-09-02 DIAGNOSIS — Z96642 Presence of left artificial hip joint: Secondary | ICD-10-CM | POA: Diagnosis not present

## 2020-09-02 DIAGNOSIS — N819 Female genital prolapse, unspecified: Secondary | ICD-10-CM

## 2020-09-02 HISTORY — DX: Presence of urogenital implants: Z96.0

## 2020-09-02 HISTORY — DX: Type 2 diabetes mellitus without complications: E11.9

## 2020-09-02 HISTORY — PX: COLPOCLEISIS: SHX6814

## 2020-09-02 HISTORY — DX: Chronic kidney disease, stage 3 unspecified: N18.30

## 2020-09-02 HISTORY — DX: Presence of spectacles and contact lenses: Z97.3

## 2020-09-02 HISTORY — DX: Mixed incontinence: N39.46

## 2020-09-02 HISTORY — DX: Presence of dental prosthetic device (complete) (partial): K08.109

## 2020-09-02 HISTORY — DX: Complete loss of teeth, unspecified cause, unspecified class: K08.109

## 2020-09-02 HISTORY — PX: CYSTOSCOPY: SHX5120

## 2020-09-02 HISTORY — DX: Prolapse of vaginal vault 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: N99.3

## 2020-09-02 HISTORY — DX: Complete loss of teeth, unspecified cause, unspecified class: Z97.2

## 2020-09-02 LAB — TYPE AND SCREEN
ABO/RH(D): O POS
Antibody Screen: NEGATIVE

## 2020-09-02 LAB — GLUCOSE, CAPILLARY
Glucose-Capillary: 106 mg/dL — ABNORMAL HIGH (ref 70–99)
Glucose-Capillary: 141 mg/dL — ABNORMAL HIGH (ref 70–99)

## 2020-09-02 SURGERY — COLPOCLEISIS
Anesthesia: General | Site: Vagina

## 2020-09-02 MED ORDER — POVIDONE-IODINE 10 % EX SWAB: 2.0000 "application " | Freq: Once | CUTANEOUS | Status: DC

## 2020-09-02 MED ORDER — ARTIFICIAL TEARS OPHTHALMIC OINT
TOPICAL_OINTMENT | OPHTHALMIC | Status: AC
  Filled 2020-09-02: qty 3.5

## 2020-09-02 MED ORDER — ONDANSETRON HCL 4 MG/2ML IJ SOLN
INTRAMUSCULAR | Status: AC
  Filled 2020-09-02: qty 2

## 2020-09-02 MED ORDER — SODIUM CHLORIDE 0.9 % IV SOLN: INTRAVENOUS | Status: DC

## 2020-09-02 MED ORDER — OXYCODONE HCL 5 MG PO TABS
ORAL_TABLET | ORAL | Status: AC
  Filled 2020-09-02: qty 1

## 2020-09-02 MED ORDER — SODIUM CHLORIDE 0.9 % IV SOLN
2.0000 g | INTRAVENOUS | Status: AC
  Administered 2020-09-02: 2 g via INTRAVENOUS
  Filled 2020-09-02: qty 2

## 2020-09-02 MED ORDER — DEXAMETHASONE SODIUM PHOSPHATE 10 MG/ML IJ SOLN
INTRAMUSCULAR | Status: AC
  Filled 2020-09-02: qty 1

## 2020-09-02 MED ORDER — 0.9 % SODIUM CHLORIDE (POUR BTL) OPTIME
TOPICAL | Status: DC | PRN
  Administered 2020-09-02: 500 mL

## 2020-09-02 MED ORDER — ACETAMINOPHEN 500 MG PO TABS
ORAL_TABLET | ORAL | Status: AC
  Filled 2020-09-02: qty 2

## 2020-09-02 MED ORDER — PROPOFOL 10 MG/ML IV BOLUS
INTRAVENOUS | Status: DC | PRN
  Administered 2020-09-02: 100 mg via INTRAVENOUS
  Administered 2020-09-02: 30 mg via INTRAVENOUS

## 2020-09-02 MED ORDER — HEMOSTATIC AGENTS (NO CHARGE) OPTIME
TOPICAL | Status: DC | PRN
  Administered 2020-09-02: 1 via TOPICAL

## 2020-09-02 MED ORDER — INDIGOTINDISULFONATE SODIUM 8 MG/ML IJ SOLN
INTRAMUSCULAR | Status: DC | PRN
  Administered 2020-09-02: 40 mg via INTRAVENOUS

## 2020-09-02 MED ORDER — LACTATED RINGERS IV SOLN: INTRAVENOUS | Status: DC

## 2020-09-02 MED ORDER — FENTANYL CITRATE (PF) 250 MCG/5ML IJ SOLN
INTRAMUSCULAR | Status: AC
  Filled 2020-09-02: qty 5

## 2020-09-02 MED ORDER — FENTANYL CITRATE (PF) 100 MCG/2ML IJ SOLN: 25.0000 ug | INTRAMUSCULAR | Status: DC | PRN

## 2020-09-02 MED ORDER — PROPOFOL 10 MG/ML IV BOLUS
INTRAVENOUS | Status: AC
  Filled 2020-09-02: qty 20

## 2020-09-02 MED ORDER — OXYCODONE HCL 5 MG PO TABS: 5.0000 mg | ORAL_TABLET | Freq: Four times a day (QID) | ORAL | 0 refills | Status: DC | PRN

## 2020-09-02 MED ORDER — FENTANYL CITRATE (PF) 100 MCG/2ML IJ SOLN
INTRAMUSCULAR | Status: DC | PRN
  Administered 2020-09-02: 25 ug via INTRAVENOUS
  Administered 2020-09-02 (×2): 50 ug via INTRAVENOUS
  Administered 2020-09-02: 25 ug via INTRAVENOUS
  Administered 2020-09-02: 50 ug via INTRAVENOUS

## 2020-09-02 MED ORDER — BUPIVACAINE-EPINEPHRINE (PF) 0.25% -1:200000 IJ SOLN
INTRAMUSCULAR | Status: DC | PRN
  Administered 2020-09-02: 25 mL

## 2020-09-02 MED ORDER — OXYCODONE HCL 5 MG PO TABS
5.0000 mg | ORAL_TABLET | Freq: Once | ORAL | Status: AC | PRN
  Administered 2020-09-02: 5 mg via ORAL

## 2020-09-02 MED ORDER — ACETAMINOPHEN 500 MG PO TABS
1000.0000 mg | ORAL_TABLET | ORAL | Status: AC
  Administered 2020-09-02: 1000 mg via ORAL

## 2020-09-02 MED ORDER — INDIGOTINDISULFONATE SODIUM 8 MG/ML IJ SOLN
INTRAMUSCULAR | Status: AC
  Filled 2020-09-02: qty 5

## 2020-09-02 MED ORDER — SODIUM CHLORIDE 0.9 % IR SOLN
Status: DC | PRN
  Administered 2020-09-02: 1000 mL via INTRAVESICAL

## 2020-09-02 MED ORDER — ONDANSETRON HCL 4 MG/2ML IJ SOLN
INTRAMUSCULAR | Status: DC | PRN
  Administered 2020-09-02: 4 mg via INTRAVENOUS

## 2020-09-02 MED ORDER — DOCUSATE SODIUM 100 MG PO CAPS: 100.0000 mg | ORAL_CAPSULE | Freq: Two times a day (BID) | ORAL | 2 refills | Status: DC

## 2020-09-02 MED ORDER — LIDOCAINE 2% (20 MG/ML) 5 ML SYRINGE
INTRAMUSCULAR | Status: DC | PRN
  Administered 2020-09-02: 60 mg via INTRAVENOUS

## 2020-09-02 MED ORDER — DEXAMETHASONE SODIUM PHOSPHATE 10 MG/ML IJ SOLN
INTRAMUSCULAR | Status: DC | PRN
  Administered 2020-09-02: 5 mg via INTRAVENOUS

## 2020-09-02 MED ORDER — ONDANSETRON HCL 4 MG/2ML IJ SOLN: 4.0000 mg | Freq: Once | INTRAMUSCULAR | Status: DC | PRN

## 2020-09-02 SURGICAL SUPPLY — 45 items
AGENT HMST KT MTR STRL THRMB (HEMOSTASIS)
BLADE SURG 15 STRL LF DISP TIS (BLADE) IMPLANT
BLADE SURG 15 STRL SS (BLADE) ×4
CLOTH BEACON ORANGE TIMEOUT ST (SAFETY) ×4 IMPLANT
CNTNR URN SCR LID CUP LEK RST (MISCELLANEOUS) IMPLANT
CONT SPEC 4OZ STRL OR WHT (MISCELLANEOUS)
COVER WAND RF STERILE (DRAPES) ×4 IMPLANT
DECANTER SPIKE VIAL GLASS SM (MISCELLANEOUS) IMPLANT
GAUZE 4X4 16PLY RFD (DISPOSABLE) IMPLANT
GAUZE PACKING 2X5 YD STRL (GAUZE/BANDAGES/DRESSINGS) ×4 IMPLANT
GAUZE SPONGE 4X4 12PLY STRL LF (GAUZE/BANDAGES/DRESSINGS) ×2 IMPLANT
GLOVE SURG ENC MOIS LTX SZ6.5 (GLOVE) ×4 IMPLANT
GLOVE SURG LTX SZ6.5 (GLOVE) ×4 IMPLANT
GLOVE SURG UNDER POLY LF SZ6.5 (GLOVE) ×4 IMPLANT
GLOVE SURG UNDER POLY LF SZ7 (GLOVE) ×8 IMPLANT
GLOVE SURG UNDER POLY LF SZ7.5 (GLOVE) ×2 IMPLANT
GOWN STRL REUS W/ TWL LRG LVL3 (GOWN DISPOSABLE) IMPLANT
GOWN STRL REUS W/TWL LRG LVL3 (GOWN DISPOSABLE) ×18 IMPLANT
HEMOSTAT SNOW SURGICEL 2X4 (HEMOSTASIS) ×2 IMPLANT
KIT TURNOVER CYSTO (KITS) ×4 IMPLANT
MARKER SKIN DUAL TIP RULER LAB (MISCELLANEOUS) ×4 IMPLANT
NEEDLE HYPO 22GX1.5 SAFETY (NEEDLE) ×4 IMPLANT
NS IRRIG 1000ML POUR BTL (IV SOLUTION) ×4 IMPLANT
NS IRRIG 500ML POUR BTL (IV SOLUTION) ×2 IMPLANT
PACK VAGINAL WOMENS (CUSTOM PROCEDURE TRAY) ×4 IMPLANT
PENCIL SMOKE EVACUATOR (MISCELLANEOUS) ×2 IMPLANT
SET IRRIG Y TYPE TUR BLADDER L (SET/KITS/TRAYS/PACK) ×2 IMPLANT
SPONGE SURGIFOAM ABS GEL 100 (HEMOSTASIS) IMPLANT
SPONGE SURGIFOAM ABS GEL 12-7 (HEMOSTASIS) IMPLANT
SURGIFLO W/THROMBIN 8M KIT (HEMOSTASIS) IMPLANT
SUT MNCRL AB 4-0 PS2 18 (SUTURE) IMPLANT
SUT PDS AB 0 CT 36 (SUTURE) IMPLANT
SUT PDS AB 0 CT1 36 (SUTURE) IMPLANT
SUT PDS AB 3-0 SH 27 (SUTURE) IMPLANT
SUT VIC AB 0 CT1 18XCR BRD8 (SUTURE) IMPLANT
SUT VIC AB 0 CT1 8-18 (SUTURE)
SUT VIC AB 2-0 CT1 (SUTURE) ×2 IMPLANT
SUT VIC AB 2-0 CT1 18 (SUTURE) ×8 IMPLANT
SUT VIC AB 2-0 CT2 27 (SUTURE) IMPLANT
SUT VIC AB 2-0 SH 27 (SUTURE) ×8
SUT VIC AB 2-0 SH 27XBRD (SUTURE) ×4 IMPLANT
SYR BULB IRRIG 60ML STRL (SYRINGE) ×4 IMPLANT
TOWEL OR 17X26 10 PK STRL BLUE (TOWEL DISPOSABLE) ×6 IMPLANT
TRAY FOLEY W/BAG SLVR 14FR LF (SET/KITS/TRAYS/PACK) ×4 IMPLANT
WARMER LAPAROSCOPE (MISCELLANEOUS) ×4 IMPLANT

## 2020-09-02 NOTE — Interval H&P Note (Signed)
History and Physical Interval Note:  09/02/2020 7:18 AM  Misty Cervantes  has presented today for surgery, with the diagnosis of vaginal vault prolapse.  The various methods of treatment have been discussed with the patient and family. After consideration of risks, benefits and other options for treatment, the patient has consented to  Procedure(s): COLPOCLEISIS, PERINEORRHAPHY (N/A) CYSTOSCOPY (N/A) as a surgical intervention.  The patient's history has been reviewed, patient examined, no change in status, stable for surgery.  I have reviewed the patient's chart and labs.  Questions were answered to the patient's satisfaction.   The patient is off of norvasc and back on dyazide.    Salvadore Dom

## 2020-09-02 NOTE — Op Note (Signed)
Preoperative Diagnosis: Vaginal vault prolapse  Postoperative Diagnosis: Same  Procedure:  Colpocleisis, perineorrhaqphy and cystoscopy  Surgeon: Dr Sumner Boast  Assistant: Dr Josefa Half, an MD assistant was necessary for tissue manipulation, retraction and positioning due to the complexity of the case and hospital policies  Anesthesia: General  EBL: 100 cc  Fluids: 800 cc NS  Urine output: 75 cc  Indications for surgery: The patient is a 75 year old female, who presented with vaginal vault prolapse. She has tried several different pessaries over the last several years and all of them have lead to vaginal irritation. The patient is not sexually active, has no desire to be sexually active and desires definitive repair. She is aware that a colpocleisis will close her vagina and make vaginal intercourse not possible.The patient is aware of the risks and complications involved with the surgery and consent was obtained prior to the procedure.  Findings: Vaginal vault prolapse. Significant scarring and ulceration of the posterior vaginal wall from the pessary. Cystoscopy with normal bladder mucosa, normal ureteral jets bilaterally.   Procedure: The patient was taken to the operating room with an IV in placed, preoperative antibiotics had been administered. She was placed in the dorsal lithotomy position. General anesthesia was administered. She was prepped and draped in the usual sterile fashion for a vaginal surgery. A foley catheter was placed.   The vaginal apex was grasped with 2 alice clamps. A rectangular area on the anterior and posterior vagina mucosa was marked with a marking pen. This region extended the length of the prolapse up until the distal 3 cm of the vagina. Care was taken to stay 1-2 cm above the urethrovesical junction. The vaginal mucosa was injected with 0.25% marcaine with epinephrine. The vaginal mucosa was incised with a # 10 blade over the previously marked rectangles  and the vaginal mucosa was undermined. The vaginal mucosa within the marked area was removed from the underlying muscularis with a combination of sharp and blunt dissection. There were marked areas of inflammation, some ulceration and scarring of the posterior vaginal mucosa from the pessary which made the dissection take longer posteriorly.   The anterior vaginal mucosa was sewn to the posterior vaginal mucosa at the apex and on the sides of the vagina. The endopelvic fascia was then sewn together anteriorly to posteriorly, imbricating the tissue and reducing the prolapse. Sequential stitches were placed thereby further reducing the prolapse. There was some mild bleeding during the case that was controlled with cautery or placing a stitch with 2-0 vicryl. Surgicel snow was placed against the reduced prolapse. The anterior and posterior vaginal mucosa were sewn together, closing the vagina. Hemostasis was excellent. A rectal exam was done, there were no stitches in her bladder.   Two alices were placed on the posterior fourchette. These were placed high enough to significantly reduce the vaginal opening to ~2 cm. The introitus was injected with 0.25% marcaine with epinephrine and a triangle of tissue was removed between the 2 alice clamps and on the perineum creating a triangle. Two deep stitches were placed in the perineal body and the vaginal/perineal incision was closed with a running stitch of 2-0 Vicryl thereby narrowing the vaginal introitus.   The patient was given indigo carmine prior to starting the perineal repair. After the perineal repair the cystoscopy was performed.  The foley catheter was removed and cystoscopy was performed using a 70 degree scope. Both ureters expelled urine, no bladder abnormalities were noted. The bladder was allowed to  drain and the cystoscope was removed.   The patient's perineum were cleansed and she was taken out of the dorsal lithotomy position. Upon awakening she  was extubated and taken to the recovery room in stable condition. The sponge and instrument counts were correct.

## 2020-09-02 NOTE — Anesthesia Procedure Notes (Signed)
Procedure Name: LMA Insertion Date/Time: 09/02/2020 7:32 AM Performed by: Rogers Blocker, CRNA Pre-anesthesia Checklist: Patient identified, Emergency Drugs available, Suction available and Patient being monitored Patient Re-evaluated:Patient Re-evaluated prior to induction Oxygen Delivery Method: Circle System Utilized Preoxygenation: Pre-oxygenation with 100% oxygen Induction Type: IV induction Ventilation: Mask ventilation without difficulty LMA: LMA inserted LMA Size: 4.0 Number of attempts: 1 Placement Confirmation: positive ETCO2 Tube secured with: Tape Dental Injury: Teeth and Oropharynx as per pre-operative assessment

## 2020-09-02 NOTE — Discharge Instructions (Signed)

## 2020-09-02 NOTE — OR Nursing (Signed)
Dr Talbert Nan removed cube pessary in OR at [phone].

## 2020-09-02 NOTE — Anesthesia Preprocedure Evaluation (Signed)
Anesthesia Evaluation  Patient identified by MRN, date of birth, ID band Patient awake    Reviewed: Allergy & Precautions, NPO status , Patient's Chart, lab work & pertinent test results  Airway Mallampati: II  TM Distance: >3 FB Neck ROM: Full    Dental  (+) Dental Advisory Given, Lower Dentures, Upper Dentures   Pulmonary neg pulmonary ROS,    Pulmonary exam normal breath sounds clear to auscultation       Cardiovascular hypertension, Pt. on medications Normal cardiovascular exam Rhythm:Regular Rate:Normal     Neuro/Psych negative neurological ROS     GI/Hepatic Neg liver ROS, PUD,   Endo/Other  diabetes, Type 2  Renal/GU Renal InsufficiencyRenal disease     Musculoskeletal  (+) Arthritis ,   Abdominal   Peds  Hematology negative hematology ROS (+)   Anesthesia Other Findings Day of surgery medications reviewed with the patient.  Reproductive/Obstetrics vaginal vault prolapse                             Anesthesia Physical Anesthesia Plan  ASA: 2  Anesthesia Plan: General   Post-op Pain Management:    Induction: Intravenous  PONV Risk Score and Plan: 4 or greater and Dexamethasone, Ondansetron and Treatment may vary due to age or medical condition  Airway Management Planned: Oral ETT and LMA  Additional Equipment:   Intra-op Plan:   Post-operative Plan: Extubation in OR  Informed Consent: I have reviewed the patients History and Physical, chart, labs and discussed the procedure including the risks, benefits and alternatives for the proposed anesthesia with the patient or authorized representative who has indicated his/her understanding and acceptance.     Dental advisory given  Plan Discussed with: CRNA  Anesthesia Plan Comments:        Anesthesia Quick Evaluation

## 2020-09-02 NOTE — Transfer of Care (Signed)
Immediate Anesthesia Transfer of Care Note  Patient: Misty Cervantes  Procedure(s) Performed: COLPOCLEISIS, PERINEORRHAPHY (Vagina ) CYSTOSCOPY (Bladder)  Patient Location: PACU  Anesthesia Type:General  Level of Consciousness: awake, alert , oriented and patient cooperative  Airway & Oxygen Therapy: Patient Spontanous Breathing and Patient connected to face mask oxygen  Post-op Assessment: Report given to RN and Post -op Vital signs reviewed and stable  Post vital signs: Reviewed and stable  Last Vitals:  Vitals Value Taken Time  BP 167/86 09/02/20 1018  Temp    Pulse 80 09/02/20 1020  Resp 20 09/02/20 1020  SpO2 100 % 09/02/20 1020  Vitals shown include unvalidated device data.  Last Pain:  Vitals:   09/02/20 0613  TempSrc: Oral  PainSc: 0-No pain      Patients Stated Pain Goal: 3 (01/65/80 0634)  Complications: No notable events documented.

## 2020-09-02 NOTE — Anesthesia Postprocedure Evaluation (Signed)
Anesthesia Post Note  Patient: Misty Cervantes  Procedure(s) Performed: COLPOCLEISIS, PERINEORRHAPHY (Vagina ) CYSTOSCOPY (Bladder)     Patient location during evaluation: PACU Anesthesia Type: General Level of consciousness: awake and alert Pain management: pain level controlled Vital Signs Assessment: post-procedure vital signs reviewed and stable Respiratory status: spontaneous breathing, nonlabored ventilation, respiratory function stable and patient connected to nasal cannula oxygen Cardiovascular status: blood pressure returned to baseline and stable Postop Assessment: no apparent nausea or vomiting Anesthetic complications: no   No notable events documented.  Last Vitals:  Vitals:   09/02/20 1030 09/02/20 1045  BP: (!) 161/78 (!) 150/78  Pulse: 79 79  Resp: (!) 21 (!) 21  Temp:    SpO2: 99% 98%    Last Pain:  Vitals:   09/02/20 1045  TempSrc:   PainSc: 0-No pain                 Catalina Gravel

## 2020-09-03 ENCOUNTER — Encounter (HOSPITAL_BASED_OUTPATIENT_CLINIC_OR_DEPARTMENT_OTHER): Payer: Self-pay | Admitting: Obstetrics and Gynecology

## 2020-09-03 ENCOUNTER — Telehealth: Payer: Self-pay | Admitting: Obstetrics and Gynecology

## 2020-09-03 NOTE — Telephone Encounter (Signed)
Called patient to check on her. S/P surgery yesterday. Per DPR detailed message left.

## 2020-09-08 ENCOUNTER — Ambulatory Visit (INDEPENDENT_AMBULATORY_CARE_PROVIDER_SITE_OTHER): Payer: Medicare Other | Admitting: Obstetrics and Gynecology

## 2020-09-08 ENCOUNTER — Other Ambulatory Visit: Payer: Self-pay

## 2020-09-08 ENCOUNTER — Encounter: Payer: Self-pay | Admitting: Obstetrics and Gynecology

## 2020-09-08 VITALS — BP 130/72 | HR 77 | Ht <= 58 in | Wt 142.0 lb

## 2020-09-08 DIAGNOSIS — G8918 Other acute postprocedural pain: Secondary | ICD-10-CM

## 2020-09-08 DIAGNOSIS — Z9889 Other specified postprocedural states: Secondary | ICD-10-CM

## 2020-09-08 DIAGNOSIS — Z8742 Personal history of other diseases of the female genital tract: Secondary | ICD-10-CM

## 2020-09-08 MED ORDER — OXYCODONE HCL 5 MG PO TABS: 5.0000 mg | ORAL_TABLET | Freq: Four times a day (QID) | ORAL | 0 refills | Status: DC | PRN

## 2020-09-08 NOTE — Progress Notes (Signed)
GYNECOLOGY  VISIT   HPI: 75 y.o.   Divorced White or Caucasian Not Hispanic or Latino  female   [phone] with No LMP recorded (lmp unknown). Patient has had a hysterectomy.   here for 1 week post op following colpocleisis perineorrhaphy and cytoscopy. Still sore, would like more oxycodone. Tylenol and ibuprofen help a little, but the oxycodone help her sleep. She is having a normal BM 1-2 x a day. Voiding well. Her pain is improving, but she is becoming more active and that is causing discomfort. Some spotting.  She hasn't been using her vaginal estrogen  GYNECOLOGIC HISTORY: No LMP recorded (lmp unknown). Patient has had a hysterectomy. Contraception:PMP Menopausal hormone therapy: vaginal estrogen        OB History     Gravida  2   Para  2   Term  2   Preterm      AB      Living  1      SAB      IAB      Ectopic      Multiple      Live Births  2              Patient Active Problem List   Diagnosis Date Noted   Chronic kidney disease    Spinal stenosis of lumbar region 05/13/2020   Aortic atherosclerosis ([place]) 06/22/2019   Type 2 diabetes mellitus with hyperglycemia, without long-term current use of insulin ([place]) 06/18/2016   OA (osteoarthritis) of knee 11/03/2015   Hyperlipidemia associated with type 2 diabetes mellitus ([place]) 07/23/2014   Genu valgum 08/11/2012   Hypertension associated with diabetes ([place]) 02/25/2010    Past Medical History:  Diagnosis Date   Chronic kidney disease (CKD), stage III (moderate) ([place])    followed by pcp   Full dentures    History of 2019 novel coronavirus disease (COVID-19) 04/05/2019   result in care everywhere ,  per pt mild symptoms that resolved   History of gastric ulcer 2013   non-bleeding   HTN (hypertension)    followed by pcp   Hyperlipemia    Hypertension    Mixed stress and urge incontinence    Osteoarthritis    Presence of pessary    Type 2 diabetes mellitus ([place])    followed by pcp   (08-26-2020 per  pt checks blood sugar twice weekly, unsure what fasting sugar is)   Vaginal vault 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    gyn--- dr Talbert Nan, using pessary   Wears glasses     Past Surgical History:  Procedure Laterality Date   CARPAL TUNNEL RELEASE Right    1980s   CATARACT EXTRACTION W/ INTRAOCULAR LENS IMPLANT Bilateral 2013   approx   COLONOSCOPY WITH ESOPHAGOGASTRODUODENOSCOPY (EGD)  11/17/2011   COLPOCLEISIS N/A 09/02/2020   Procedure: COLPOCLEISIS, PERINEORRHAPHY;  Surgeon: Salvadore Dom, MD;  Location: [facility];  Service: Gynecology;  Laterality: N/A;   CYSTOSCOPY N/A 09/02/2020   Procedure: CYSTOSCOPY;  Surgeon: Salvadore Dom, MD;  Location: [facility];  Service: Gynecology;  Laterality: N/A;   RETINAL DETACHMENT SURGERY Right 2003   TOTAL HIP ARTHROPLASTY Left 03/02/2010   @[facility]    TOTAL KNEE ARTHROPLASTY Right 11/03/2015   Procedure: RIGHT TOTAL KNEE ARTHROPLASTY;  Surgeon: Gaynelle Arabian, MD;  Location: [facility];  Service: Orthopedics;  Laterality: Right;   TOTAL VAGINAL HYSTERECTOMY  2012   TVH, anterior repair, TVT (no mesh used for the anterior repair)  Current Outpatient Medications  Medication Sig Dispense Refill   Accu-Chek FastClix Lancets MISC Check blood sugars 1-2 times per day. 100 each 2   ACCU-CHEK GUIDE test strip TEST FOUR TIMES DAILY. 100 strip 6   acetaminophen (TYLENOL) 325 MG tablet Take 650 mg by mouth as needed.     albuterol (VENTOLIN HFA) 108 (90 Base) MCG/ACT inhaler TAKE 2 PUFFS BY MOUTH EVERY 6 HOURS AS NEEDED FOR WHEEZE OR SHORTNESS OF BREATH 6.7 each 1   aspirin EC 81 MG tablet Take 81 mg by mouth daily. Swallow whole.     Cyanocobalamin (B-12 PO) Take 1 tablet by mouth daily.     fluticasone (FLONASE) 50 MCG/ACT nasal spray PLACE 1 SPRAY INTO BOTH NOSTRILS 2 (TWO) TIMES DAILY AS NEEDED FOR ALLERGIES OR RHINITIS. 48 mL 0   glipiZIDE (GLUCOTROL XL) 5 MG 24 hr tablet Take 1 tablet (5 mg total) by mouth daily with  breakfast. 90 tablet 1   meclizine (ANTIVERT) 25 MG tablet TAKE 1 TABLET (25 MG TOTAL) BY MOUTH 3 (THREE) TIMES DAILY AS NEEDED FOR DIZZINESS. 60 tablet 1   Multiple Vitamin (MULTIVITAMIN WITH MINERALS) TABS tablet Take 1 tablet by mouth daily.     pravastatin (PRAVACHOL) 40 MG tablet Take 1 tablet (40 mg total) by mouth daily. 90 tablet 0   traZODone (DESYREL) 50 MG tablet TAKE 1/2 TO 1 TABLET BY MOUTH AT BEDTIME AS NEEDED FOR SLEEP (Patient taking differently: Take 50 mg by mouth at bedtime as needed. TAKE 1/2 TO 1 TABLET BY MOUTH AT BEDTIME AS NEEDED FOR SLEEP) 90 tablet 2   triamterene-hydrochlorothiazide (DYAZIDE) 37.5-25 MG capsule Take 1 capsule by mouth daily.     oxyCODONE (OXY IR/ROXICODONE) 5 MG immediate release tablet Take 1 tablet (5 mg total) by mouth every 6 (six) hours as needed for severe pain. 10 tablet 0   No current facility-administered medications for this visit.     ALLERGIES: Norvasc [amlodipine]  Family History  Problem Relation Age of Onset   Diabetes Mother        deceased   Heart attack Mother    Diabetes Sister        x2   Kidney cancer Father    Heart attack Father    Diabetes Sister     Social History   Socioeconomic History   Marital status: Divorced    Spouse name: Not on file   Number of children: Not on file   Years of education: Not on file   Highest education level: Not on file  Occupational History   Occupation: retired    Fish farm manager: RETIRED  Tobacco Use   Smoking status: Never   Smokeless tobacco: Never  Vaping Use   Vaping Use: Never used  Substance and Sexual Activity   Alcohol use: No   Drug use: No   Sexual activity: Not Currently    Partners: Male    Birth control/protection: Surgical    Comment: hysterectomy  Other Topics Concern   Not on file  Social History Narrative   Divorced, 2 children; retired.    Custody of grandchildren    Social Determinants of Health   Financial Resource Strain: Low Risk    Difficulty of  Paying Living Expenses: Not hard at all  Food Insecurity: No Food Insecurity   Worried About Charity fundraiser in the Last Year: Never true   Ran Out of Food in the Last Year: Never true  Transportation Needs: No Transportation Needs  Lack of Transportation (Medical): No   Lack of Transportation (Non-Medical): No  Physical Activity: Inactive   Days of Exercise per Week: 0 days   Minutes of Exercise per Session: 0 min  Stress: Stress Concern Present   Feeling of Stress : To some extent  Social Connections: Socially Isolated   Frequency of Communication with Friends and Family: More than three times a week   Frequency of Social Gatherings with Friends and Family: More than three times a week   Attends Religious Services: Never   Marine scientist or Organizations: No   Attends Music therapist: Never   Marital Status: Divorced  Human resources officer Violence: Not At Risk   Fear of Current or Ex-Partner: No   Emotionally Abused: No   Physically Abused: No   Sexually Abused: No    Review of Systems  All other systems reviewed and are negative.  PHYSICAL EXAMINATION:    BP 130/72   Pulse 77   Ht 4\' 10"  (1.473 m)   Wt 142 lb (64.4 kg)   LMP  (LMP Unknown)   SpO2 100%   BMI 29.68 kg/m     General appearance: alert, cooperative and appears stated age  Pelvic: External genitalia:  no lesions, perineum is healing well              Urethra:  normal appearing urethra with no masses, tenderness or lesions              Bartholins and Skenes: normal                 Vagina: looked inside the distal vagina with a pediatric speculum, healing well. No signs of infection  Chaperone was present for exam.  1. History of colpocleisis Appears to be healing well She is still hurting and requesting more oxycodone. Tylenol and ibuprofen help a little  - oxyCODONE (OXY IR/ROXICODONE) 5 MG immediate release tablet; Take 1 tablet (5 mg total) by mouth every 6 (six) hours as  needed for severe pain.  Dispense: 10 tablet; Refill: 0  2. Post-op pain Continue with tylenol, ibuprofen. Don't over exert herself - oxyCODONE (OXY IR/ROXICODONE) 5 MG immediate release tablet; Take 1 tablet (5 mg total) by mouth every 6 (six) hours as needed for severe pain.  Dispense: 10 tablet; Refill: 0

## 2020-09-16 ENCOUNTER — Other Ambulatory Visit: Payer: Self-pay

## 2020-09-16 ENCOUNTER — Ambulatory Visit
Admission: RE | Admit: 2020-09-16 | Discharge: 2020-09-16 | Disposition: A | Discharge status: Home / Self Care | Payer: Medicare Other | Source: Ambulatory Visit | Attending: Obstetrics and Gynecology | Admitting: Obstetrics and Gynecology

## 2020-09-16 DIAGNOSIS — Z1231 Encounter for screening mammogram for malignant neoplasm of breast: Secondary | ICD-10-CM | POA: Diagnosis not present

## 2020-09-17 ENCOUNTER — Ambulatory Visit: Payer: Self-pay | Admitting: Obstetrics and Gynecology

## 2020-09-25 ENCOUNTER — Encounter: Payer: Self-pay | Admitting: Obstetrics and Gynecology

## 2020-09-30 ENCOUNTER — Other Ambulatory Visit: Payer: Self-pay

## 2020-09-30 ENCOUNTER — Ambulatory Visit (INDEPENDENT_AMBULATORY_CARE_PROVIDER_SITE_OTHER): Payer: Medicare Other | Admitting: Obstetrics and Gynecology

## 2020-09-30 ENCOUNTER — Encounter: Payer: Self-pay | Admitting: Obstetrics and Gynecology

## 2020-09-30 VITALS — BP 134/84 | HR 98 | Ht <= 58 in | Wt 145.8 lb

## 2020-09-30 DIAGNOSIS — Z9889 Other specified postprocedural states: Secondary | ICD-10-CM

## 2020-09-30 DIAGNOSIS — Z8742 Personal history of other diseases of the female genital tract: Secondary | ICD-10-CM

## 2020-09-30 NOTE — Progress Notes (Signed)
GYNECOLOGY  VISIT   HPI: 75 y.o.   Divorced White or Caucasian Not Hispanic or Latino  female   [phone] with No LMP recorded (lmp unknown). Patient has had a hysterectomy.   here for  4 week post op. She is doing well. No longer having pain. No bladder c/o, no leakage. Normal bowel movements.   She will need surgery on her hand, has damaged tendons on the right after an injury.     GYNECOLOGIC HISTORY: No LMP recorded (lmp unknown). Patient has had a hysterectomy. Contraception:pmp Menopausal hormone therapy: none        OB History     Gravida  2   Para  2   Term  2   Preterm      AB      Living  1      SAB      IAB      Ectopic      Multiple      Live Births  2              Patient Active Problem List   Diagnosis Date Noted   Chronic kidney disease    Spinal stenosis of lumbar region 05/13/2020   Aortic atherosclerosis ([place]) 06/22/2019   Type 2 diabetes mellitus with hyperglycemia, without long-term current use of insulin ([place]) 06/18/2016   OA (osteoarthritis) of knee 11/03/2015   Hyperlipidemia associated with type 2 diabetes mellitus ([place]) 07/23/2014   Genu valgum 08/11/2012   Hypertension associated with diabetes ([place]) 02/25/2010    Past Medical History:  Diagnosis Date   Chronic kidney disease (CKD), stage III (moderate) ([place])    followed by pcp   Full dentures    History of 2019 novel coronavirus disease (COVID-19) 04/05/2019   result in care everywhere ,  per pt mild symptoms that resolved   History of gastric ulcer 2013   non-bleeding   HTN (hypertension)    followed by pcp   Hyperlipemia    Hypertension    Mixed stress and urge incontinence    Osteoarthritis    Presence of pessary    Type 2 diabetes mellitus ([place])    followed by pcp   (08-26-2020 per pt checks blood sugar twice weekly, unsure what fasting sugar is)   Vaginal vault 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    gyn--- dr Talbert Nan, using pessary   Wears glasses     Past Surgical  History:  Procedure Laterality Date   CARPAL TUNNEL RELEASE Right    1980s   CATARACT EXTRACTION W/ INTRAOCULAR LENS IMPLANT Bilateral 2013   approx   COLONOSCOPY WITH ESOPHAGOGASTRODUODENOSCOPY (EGD)  11/17/2011   COLPOCLEISIS N/A 09/02/2020   Procedure: COLPOCLEISIS, PERINEORRHAPHY;  Surgeon: Salvadore Dom, MD;  Location: [facility];  Service: Gynecology;  Laterality: N/A;   CYSTOSCOPY N/A 09/02/2020   Procedure: CYSTOSCOPY;  Surgeon: Salvadore Dom, MD;  Location: [facility];  Service: Gynecology;  Laterality: N/A;   RETINAL DETACHMENT SURGERY Right 2003   TOTAL HIP ARTHROPLASTY Left 03/02/2010   @[facility]    TOTAL KNEE ARTHROPLASTY Right 11/03/2015   Procedure: RIGHT TOTAL KNEE ARTHROPLASTY;  Surgeon: Gaynelle Arabian, MD;  Location: [facility];  Service: Orthopedics;  Laterality: Right;   TOTAL VAGINAL HYSTERECTOMY  2012   TVH, anterior repair, TVT (no mesh used for the anterior repair)    Current Outpatient Medications  Medication Sig Dispense Refill   Accu-Chek FastClix Lancets MISC Check blood sugars 1-2 times per day. 100 each  2   ACCU-CHEK GUIDE test strip TEST FOUR TIMES DAILY. 100 strip 6   acetaminophen (TYLENOL) 325 MG tablet Take 650 mg by mouth as needed.     albuterol (VENTOLIN HFA) 108 (90 Base) MCG/ACT inhaler TAKE 2 PUFFS BY MOUTH EVERY 6 HOURS AS NEEDED FOR WHEEZE OR SHORTNESS OF BREATH 6.7 each 1   aspirin EC 81 MG tablet Take 81 mg by mouth daily. Swallow whole.     Cyanocobalamin (B-12 PO) Take 1 tablet by mouth daily.     fluticasone (FLONASE) 50 MCG/ACT nasal spray PLACE 1 SPRAY INTO BOTH NOSTRILS 2 (TWO) TIMES DAILY AS NEEDED FOR ALLERGIES OR RHINITIS. 48 mL 0   glipiZIDE (GLUCOTROL XL) 5 MG 24 hr tablet Take 1 tablet (5 mg total) by mouth daily with breakfast. 90 tablet 1   meclizine (ANTIVERT) 25 MG tablet TAKE 1 TABLET (25 MG TOTAL) BY MOUTH 3 (THREE) TIMES DAILY AS NEEDED FOR DIZZINESS. 60 tablet 1   Multiple Vitamin  (MULTIVITAMIN WITH MINERALS) TABS tablet Take 1 tablet by mouth daily.     pravastatin (PRAVACHOL) 40 MG tablet Take 1 tablet (40 mg total) by mouth daily. 90 tablet 0   traZODone (DESYREL) 50 MG tablet TAKE 1/2 TO 1 TABLET BY MOUTH AT BEDTIME AS NEEDED FOR SLEEP (Patient taking differently: Take 50 mg by mouth at bedtime as needed. TAKE 1/2 TO 1 TABLET BY MOUTH AT BEDTIME AS NEEDED FOR SLEEP) 90 tablet 2   triamterene-hydrochlorothiazide (DYAZIDE) 37.5-25 MG capsule Take 1 capsule by mouth daily.     No current facility-administered medications for this visit.     ALLERGIES: Norvasc [amlodipine]  Family History  Problem Relation Age of Onset   Diabetes Mother        deceased   Heart attack Mother    Diabetes Sister        x2   Kidney cancer Father    Heart attack Father    Diabetes Sister     Social History   Socioeconomic History   Marital status: Divorced    Spouse name: Not on file   Number of children: Not on file   Years of education: Not on file   Highest education level: Not on file  Occupational History   Occupation: retired    Fish farm manager: RETIRED  Tobacco Use   Smoking status: Never   Smokeless tobacco: Never  Vaping Use   Vaping Use: Never used  Substance and Sexual Activity   Alcohol use: No   Drug use: No   Sexual activity: Not Currently    Partners: Male    Birth control/protection: Surgical    Comment: hysterectomy  Other Topics Concern   Not on file  Social History Narrative   Divorced, 2 children; retired.    Custody of grandchildren    Social Determinants of Health   Financial Resource Strain: Low Risk    Difficulty of Paying Living Expenses: Not hard at all  Food Insecurity: No Food Insecurity   Worried About Charity fundraiser in the Last Year: Never true   Arboriculturist in the Last Year: Never true  Transportation Needs: No Transportation Needs   Lack of Transportation (Medical): No   Lack of Transportation (Non-Medical): No  Physical  Activity: Inactive   Days of Exercise per Week: 0 days   Minutes of Exercise per Session: 0 min  Stress: Stress Concern Present   Feeling of Stress : To some extent  Social Connections: Socially Isolated  Frequency of Communication with Friends and Family: More than three times a week   Frequency of Social Gatherings with Friends and Family: More than three times a week   Attends Religious Services: Never   Marine scientist or Organizations: No   Attends Music therapist: Never   Marital Status: Divorced  Human resources officer Violence: Not At Risk   Fear of Current or Ex-Partner: No   Emotionally Abused: No   Physically Abused: No   Sexually Abused: No    Review of Systems  All other systems reviewed and are negative.  PHYSICAL EXAMINATION:    BP 134/84   Pulse 98   Ht 4\' 10"  (1.473 m)   Wt 145 lb 12.8 oz (66.1 kg)   LMP  (LMP Unknown)   SpO2 99%   BMI 30.47 kg/m     General appearance: alert, cooperative and appears stated age Pelvic: External genitalia:  no lesions              Urethra:  normal appearing urethra with no masses, tenderness or lesions              Bartholins and Skenes: normal                 Vagina: healing well, no prolapse  Chaperone was present for exam.  1. History of colpocleisis Doing well F/U in one year

## 2020-10-06 ENCOUNTER — Other Ambulatory Visit: Payer: Self-pay | Admitting: Family Medicine

## 2020-10-06 DIAGNOSIS — J069 Acute upper respiratory infection, unspecified: Secondary | ICD-10-CM

## 2020-11-11 ENCOUNTER — Other Ambulatory Visit: Payer: Self-pay | Admitting: Family Medicine

## 2020-11-11 DIAGNOSIS — R42 Dizziness and giddiness: Secondary | ICD-10-CM

## 2020-11-12 ENCOUNTER — Other Ambulatory Visit: Payer: Self-pay

## 2020-11-13 DIAGNOSIS — Z20822 Contact with and (suspected) exposure to covid-19: Secondary | ICD-10-CM | POA: Diagnosis not present

## 2020-11-21 ENCOUNTER — Other Ambulatory Visit: Payer: Self-pay | Admitting: Family Medicine

## 2020-11-21 ENCOUNTER — Other Ambulatory Visit: Payer: Self-pay

## 2020-11-21 DIAGNOSIS — E1165 Type 2 diabetes mellitus with hyperglycemia: Secondary | ICD-10-CM

## 2020-12-25 ENCOUNTER — Telehealth: Payer: Self-pay

## 2020-12-25 ENCOUNTER — Other Ambulatory Visit: Payer: Self-pay | Admitting: Family Medicine

## 2020-12-25 NOTE — Telephone Encounter (Signed)
Pt is scheduled for a TOC.

## 2020-12-25 NOTE — Telephone Encounter (Signed)
LVM asking the patient to call back to get scheduled for a TOC.

## 2021-01-04 ENCOUNTER — Other Ambulatory Visit: Payer: Self-pay | Admitting: Family Medicine

## 2021-01-04 DIAGNOSIS — J069 Acute upper respiratory infection, unspecified: Secondary | ICD-10-CM

## 2021-01-11 ENCOUNTER — Other Ambulatory Visit: Payer: Self-pay | Admitting: Family Medicine

## 2021-01-22 ENCOUNTER — Ambulatory Visit (INDEPENDENT_AMBULATORY_CARE_PROVIDER_SITE_OTHER): Payer: Medicare Other | Admitting: Family

## 2021-01-22 ENCOUNTER — Other Ambulatory Visit: Payer: Self-pay

## 2021-01-22 ENCOUNTER — Encounter: Payer: Self-pay | Admitting: Family

## 2021-01-22 VITALS — BP 130/80 | HR 84 | Temp 98.1°F | Ht <= 58 in | Wt 149.0 lb

## 2021-01-22 DIAGNOSIS — E1122 Type 2 diabetes mellitus with diabetic chronic kidney disease: Secondary | ICD-10-CM | POA: Diagnosis not present

## 2021-01-22 DIAGNOSIS — N183 Chronic kidney disease, stage 3 unspecified: Secondary | ICD-10-CM

## 2021-01-22 DIAGNOSIS — E1159 Type 2 diabetes mellitus with other circulatory complications: Secondary | ICD-10-CM

## 2021-01-22 DIAGNOSIS — E785 Hyperlipidemia, unspecified: Secondary | ICD-10-CM | POA: Diagnosis not present

## 2021-01-22 DIAGNOSIS — R1310 Dysphagia, unspecified: Secondary | ICD-10-CM | POA: Insufficient documentation

## 2021-01-22 DIAGNOSIS — Z7689 Persons encountering health services in other specified circumstances: Secondary | ICD-10-CM

## 2021-01-22 DIAGNOSIS — E1165 Type 2 diabetes mellitus with hyperglycemia: Secondary | ICD-10-CM

## 2021-01-22 DIAGNOSIS — I700 Atherosclerosis of aorta: Secondary | ICD-10-CM

## 2021-01-22 DIAGNOSIS — E1169 Type 2 diabetes mellitus with other specified complication: Secondary | ICD-10-CM | POA: Diagnosis not present

## 2021-01-22 DIAGNOSIS — I152 Hypertension secondary to endocrine disorders: Secondary | ICD-10-CM

## 2021-01-22 HISTORY — DX: Dysphagia, unspecified: R13.10

## 2021-01-22 LAB — LIPID PANEL
Cholesterol: 162 mg/dL (ref 0–200)
HDL: 42.9 mg/dL (ref >39.00)
LDL Cholesterol: 96 mg/dL (ref 0–99)
NonHDL: 119.57
Total CHOL/HDL Ratio: 4
Triglycerides: 117 mg/dL (ref 0.0–149.0)
VLDL: 23.4 mg/dL (ref 0.0–40.0)

## 2021-01-22 LAB — COMPREHENSIVE METABOLIC PANEL
ALT: 17 U/L (ref 0–35)
AST: 20 U/L (ref 0–37)
Albumin: 4.3 g/dL (ref 3.5–5.2)
Alkaline Phosphatase: 76 U/L (ref 39–117)
BUN: 18 mg/dL (ref 6–23)
CO2: 31 mEq/L (ref 19–32)
Calcium: 9.2 mg/dL (ref 8.4–10.5)
Chloride: 99 mEq/L (ref 96–112)
Creatinine, Ser: 1.22 mg/dL — ABNORMAL HIGH (ref 0.40–1.20)
GFR: 43.24 mL/min — ABNORMAL LOW (ref >60.00)
Glucose, Bld: 131 mg/dL — ABNORMAL HIGH (ref 70–99)
Potassium: 3.3 mEq/L — ABNORMAL LOW (ref 3.5–5.1)
Sodium: 138 mEq/L (ref 135–145)
Total Bilirubin: 0.7 mg/dL (ref 0.2–1.2)
Total Protein: 7 g/dL (ref 6.0–8.3)

## 2021-01-22 LAB — HEMOGLOBIN A1C: Hgb A1c MFr Bld: 7.6 % — ABNORMAL HIGH (ref 4.6–6.5)

## 2021-01-22 MED ORDER — PRAVASTATIN SODIUM 40 MG PO TABS: 40.0000 mg | ORAL_TABLET | Freq: Every day | ORAL | 1 refills | Status: DC

## 2021-01-22 NOTE — Assessment & Plan Note (Signed)
Stable on Pravastatin

## 2021-01-22 NOTE — Assessment & Plan Note (Signed)
Controlled with Glipizide 5mg  qd, reports home CBG 107

## 2021-01-22 NOTE — Assessment & Plan Note (Signed)
Stable on Dyazide

## 2021-01-22 NOTE — Progress Notes (Signed)
Subjective:     Patient ID: Misty Cervantes, female    DOB: 05-09-1945, 75 y.o.   MRN: [id number]  Chief Complaint  Patient presents with   Hypertension   Chronic Kidney Disease   Transitions Of Care   Choking    She has concerns of food getting lodged in her throat.    HPI Patient presents today for follow up of multiple medical problems. Diabetes Type 2 Pt is currently maintained on the following medications for diabetes:Glipizide Last diabetic eye exam was  Lab Results  Component Value Date   HMDIABEYEEXA No Retinopathy 08/06/2016   HMDIABEYEEXA No Retinopathy 08/06/2016  Denies polyuria/polydipsia. Denies hypoglycemia. Home glucose readings range 107, fasting Lab Results  Component Value Date   HGBA1C 7.4 (H) 07/11/2020   HGBA1C 7.1 (H) 02/13/2020   HGBA1C 8.0 (A) 11/14/2019    Lab Results  Component Value Date   MICROALBUR 1.1 07/15/2020   LDLCALC 93 07/11/2020   CREATININE 1.22 (H) 08/29/2020  Hyperlipidemia Patient is currently maintained on the following medication for hyperlipidemia: Pravastatin Patient denies myalgia. Patient reports good compliance with low fat/low cholesterol diet.  Last lipid panel as follows:  Lab Results  Component Value Date   CHOL 168 07/11/2020   HDL 38.00 (L) 07/11/2020   LDLCALC 93 07/11/2020   TRIG 185.0 (H) 07/11/2020   CHOLHDL 4 07/11/2020  Hypertension Patient is currently maintained on the following medications for blood pressure: Dyazide Patient reports good compliance with blood pressure medications. Patient denies chest pain, shortness of breath, headache, dizziness, or swelling. Last 3 blood pressure readings in our office are as follows: BP Readings from Last 3 Encounters:  01/22/21 130/80  09/30/20 134/84  09/08/20 130/72   CKD: Stage 3; last GFR 46; denies taking daily NSAIDs; reports drinking water, but not 64oz qd; denies taking any new OTC or prescribed meds.  Dysphagia: Patient presents with  dysphagia. The patient describes single episode(s) with sudden onset, located in the mid neck, and symptoms lasting several minutes. Patient reports nothing has become stuck, but feels a lump sensation when swallowing. Associated with the dysphagia, patient also complains of no other symptoms. Patient denies regurgitation of undigested food.    Health Maintenance Due  Topic Date Due   TETANUS/TDAP  Never done   OPHTHALMOLOGY EXAM  08/06/2017   Pneumonia Vaccine 59+ Years old (3 - PCV) 04/12/2019   HEMOGLOBIN A1C  01/10/2021    Past Medical History:  Diagnosis Date   Chronic kidney disease (CKD), stage III (moderate) ([place])    followed by pcp   Full dentures    History of 2019 novel coronavirus disease (COVID-19) 04/05/2019   result in care everywhere ,  per pt mild symptoms that resolved   History of gastric ulcer 2013   non-bleeding   HTN (hypertension)    followed by pcp   Hyperlipemia    Hypertension    Mixed stress and urge incontinence    Osteoarthritis    Presence of pessary    Type 2 diabetes mellitus ([place])    followed by pcp   (08-26-2020 per pt checks blood sugar twice weekly, unsure what fasting sugar is)   Vaginal vault 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    gyn--- dr Talbert Nan, using pessary   Wears glasses     Past Surgical History:  Procedure Laterality Date   CARPAL TUNNEL RELEASE Right    1980s   CATARACT EXTRACTION W/ INTRAOCULAR LENS IMPLANT Bilateral 2013   approx   COLONOSCOPY WITH  ESOPHAGOGASTRODUODENOSCOPY (EGD)  11/17/2011   COLPOCLEISIS N/A 09/02/2020   Procedure: COLPOCLEISIS, PERINEORRHAPHY;  Surgeon: Salvadore Dom, MD;  Location: [facility];  Service: Gynecology;  Laterality: N/A;   CYSTOSCOPY N/A 09/02/2020   Procedure: CYSTOSCOPY;  Surgeon: Salvadore Dom, MD;  Location: [facility];  Service: Gynecology;  Laterality: N/A;   RETINAL DETACHMENT SURGERY Right 2003   TOTAL HIP ARTHROPLASTY Left 03/02/2010   @[facility]     TOTAL KNEE ARTHROPLASTY Right 11/03/2015   Procedure: RIGHT TOTAL KNEE ARTHROPLASTY;  Surgeon: Gaynelle Arabian, MD;  Location: [facility];  Service: Orthopedics;  Laterality: Right;   TOTAL VAGINAL HYSTERECTOMY  2012   TVH, anterior repair, TVT (no mesh used for the anterior repair)    Outpatient Medications Prior to Visit  Medication Sig Dispense Refill   Accu-Chek FastClix Lancets MISC Check blood sugars 1-2 times per day. 100 each 2   acetaminophen (TYLENOL) 325 MG tablet Take 650 mg by mouth as needed.     albuterol (VENTOLIN HFA) 108 (90 Base) MCG/ACT inhaler TAKE 2 PUFFS BY MOUTH EVERY 6 HOURS AS NEEDED FOR WHEEZE OR SHORTNESS OF BREATH 6.7 each 1   aspirin EC 81 MG tablet Take 81 mg by mouth daily. Swallow whole.     Cyanocobalamin (B-12 PO) Take 1 tablet by mouth daily.     fluticasone (FLONASE) 50 MCG/ACT nasal spray SPRAY 1 SPRAY INTO EACH NOSTRIL TWICE A DAY AS NEEDED FOR ALLERGIES OR RHINITIS 48 mL 0   glipiZIDE (GLUCOTROL XL) 5 MG 24 hr tablet Take 1 tablet (5 mg total) by mouth daily with breakfast. 90 tablet 1   glucose blood (ACCU-CHEK GUIDE) test strip TEST FOUR TIMES DAILY 100 strip 0   meclizine (ANTIVERT) 25 MG tablet TAKE 1 TABLET BY MOUTH 3 TIMES DAILY AS NEEDED FOR DIZZINESS. 60 tablet 1   Multiple Vitamin (MULTIVITAMIN WITH MINERALS) TABS tablet Take 1 tablet by mouth daily.     traZODone (DESYREL) 50 MG tablet TAKE 1/2 TO 1 TABLET BY MOUTH AT BEDTIME AS NEEDED FOR SLEEP (Patient taking differently: Take 50 mg by mouth at bedtime as needed. TAKE 1/2 TO 1 TABLET BY MOUTH AT BEDTIME AS NEEDED FOR SLEEP) 90 tablet 2   triamterene-hydrochlorothiazide (DYAZIDE) 37.5-25 MG capsule Take 1 capsule by mouth daily.     pravastatin (PRAVACHOL) 40 MG tablet Take 1 tablet (40 mg total) by mouth daily. 90 tablet 0   phentermine (ADIPEX-P) 37.5 MG tablet Take 37.5 mg by mouth daily as needed.     No facility-administered medications prior to visit.    Allergies  Allergen Reactions    Norvasc [Amlodipine] Swelling      Objective:    Physical Exam Vitals and nursing note reviewed.  Constitutional:      Appearance: Normal appearance.  Cardiovascular:     Rate and Rhythm: Normal rate and regular rhythm.  Pulmonary:     Effort: Pulmonary effort is normal.     Breath sounds: Normal breath sounds.  Musculoskeletal:        General: Normal range of motion.  Skin:    General: Skin is warm and dry.  Neurological:     Mental Status: She is alert.  Psychiatric:        Mood and Affect: Mood normal.        Behavior: Behavior normal.    BP 130/80   Pulse 84   Temp 98.1 F (36.7 C) (Temporal)   Ht 4' 10"  (1.473 m)  Wt 149 lb (67.6 kg)   LMP  (LMP Unknown)   SpO2 92%   BMI 31.14 kg/m  Wt Readings from Last 3 Encounters:  01/22/21 149 lb (67.6 kg)  09/30/20 145 lb 12.8 oz (66.1 kg)  09/08/20 142 lb (64.4 kg)       Assessment & Plan:   Problem List Items Addressed This Visit       Cardiovascular and Mediastinum   Hypertension associated with diabetes ([place])    Stable on Dyazide      Relevant Medications   pravastatin (PRAVACHOL) 40 MG tablet   Other Relevant Orders   Comp Met (CMET)   Aortic atherosclerosis (HCC)   Relevant Medications   pravastatin (PRAVACHOL) 40 MG tablet     Digestive   Dysphagia    Reports one time incidence, felt like she was choking, sensation that food would not go down completely. Reports she had chewed it well first. Advised on cutting in smaller bites, follow bites with several sips of water, lower chin to chest when swallowing. If noticing a continual pattern, call the office.        Endocrine   Hyperlipidemia associated with type 2 diabetes mellitus (HCC)    Stable on Pravastatin      Relevant Medications   pravastatin (PRAVACHOL) 40 MG tablet   Other Relevant Orders   Comp Met (CMET)   Lipid panel   Type 2 diabetes mellitus with hyperglycemia, without long-term current use of insulin (HCC)    Controlled with  Glipizide 36m qd, reports home CBG 107      Relevant Medications   pravastatin (PRAVACHOL) 40 MG tablet   Other Relevant Orders   HgB A1c     Genitourinary   Chronic kidney disease    Stable, reviewed precautions: avoid NSAIDs, 64oz water qd      Relevant Orders   Comp Met (CMET)     Other   Encounter to establish care with new doctor - Primary    Meds ordered this encounter  Medications   pravastatin (PRAVACHOL) 40 MG tablet    Sig: Take 1 tablet (40 mg total) by mouth daily.    Dispense:  90 tablet    Refill:  1    Order Specific Question:   Supervising Provider    Answer:   ANDY, CAMILLE L [[id number]]

## 2021-01-22 NOTE — Assessment & Plan Note (Signed)
Stable, reviewed precautions: avoid NSAIDs, 64oz water qd

## 2021-01-22 NOTE — Patient Instructions (Addendum)
It was very nice to meet you today!  Please go to the lab for blood work and I will send a message through [place] with the results. Your Pravastatin refill has been sent to your pharmacy. For good kidney function, remember to drink 64oz= 2 liters= 8 cups of water daily!   PLEASE NOTE:  If you had any lab tests please let us know if you have not heard back within a few days. You may see your results on mychart before we have a chance to review them but we will give you a call once they are reviewed by [place]. If we ordered any referrals today, please let us know if you have not heard from their office within the next week.   Please try these tips to maintain a healthy lifestyle:  Eat most of your calories during the day when you are active. Eliminate processed foods including packaged sweets (pies, cakes, cookies), reduce intake of potatoes, white bread, white pasta, and white rice. Look for whole grain options, oat flour or almond flour.  Each meal should contain half fruits/vegetables, one quarter protein, and one quarter carbs (no bigger than a computer mouse).  Cut down on sweet beverages. This includes juice, soda, and sweet tea. Also watch fruit intake, though this is a healthier sweet option, it still contains natural sugar! Limit to 3 servings daily.  Drink at least 1 glass of water with each meal and aim for at least 8 glasses per day  Exercise at least 150 minutes every week.

## 2021-01-22 NOTE — Assessment & Plan Note (Addendum)
Reports one time incidence, felt like she was choking, sensation that food would not go down completely. Reports she had chewed it well first. Advised on cutting in smaller bites, follow bites with several sips of water, lower chin to chest when swallowing. If noticing a continual pattern, call the office.

## 2021-01-25 ENCOUNTER — Other Ambulatory Visit: Payer: Self-pay | Admitting: Family Medicine

## 2021-01-25 DIAGNOSIS — I10 Essential (primary) hypertension: Secondary | ICD-10-CM

## 2021-01-27 ENCOUNTER — Other Ambulatory Visit: Payer: Self-pay

## 2021-01-27 DIAGNOSIS — E1159 Type 2 diabetes mellitus with other circulatory complications: Secondary | ICD-10-CM

## 2021-01-28 ENCOUNTER — Other Ambulatory Visit: Payer: Self-pay

## 2021-01-28 ENCOUNTER — Other Ambulatory Visit: Payer: Medicare Other

## 2021-01-28 ENCOUNTER — Other Ambulatory Visit (INDEPENDENT_AMBULATORY_CARE_PROVIDER_SITE_OTHER): Payer: Medicare Other

## 2021-01-28 DIAGNOSIS — I152 Hypertension secondary to endocrine disorders: Secondary | ICD-10-CM

## 2021-01-28 DIAGNOSIS — E1159 Type 2 diabetes mellitus with other circulatory complications: Secondary | ICD-10-CM | POA: Diagnosis not present

## 2021-01-28 LAB — COMPREHENSIVE METABOLIC PANEL
ALT: 13 U/L (ref 0–35)
AST: 16 U/L (ref 0–37)
Albumin: 4.3 g/dL (ref 3.5–5.2)
Alkaline Phosphatase: 77 U/L (ref 39–117)
BUN: 16 mg/dL (ref 6–23)
CO2: 30 mEq/L (ref 19–32)
Calcium: 9.1 mg/dL (ref 8.4–10.5)
Chloride: 100 mEq/L (ref 96–112)
Creatinine, Ser: 1.21 mg/dL — ABNORMAL HIGH (ref 0.40–1.20)
GFR: 43.67 mL/min — ABNORMAL LOW (ref >60.00)
Glucose, Bld: 145 mg/dL — ABNORMAL HIGH (ref 70–99)
Potassium: 3.4 mEq/L — ABNORMAL LOW (ref 3.5–5.1)
Sodium: 138 mEq/L (ref 135–145)
Total Bilirubin: 0.6 mg/dL (ref 0.2–1.2)
Total Protein: 7 g/dL (ref 6.0–8.3)

## 2021-01-29 ENCOUNTER — Encounter: Payer: Self-pay | Admitting: Family

## 2021-01-29 ENCOUNTER — Ambulatory Visit (INDEPENDENT_AMBULATORY_CARE_PROVIDER_SITE_OTHER): Payer: Medicare Other | Admitting: Family

## 2021-01-29 VITALS — BP 126/72 | HR 96 | Temp 98.1°F | Ht <= 58 in | Wt 147.2 lb

## 2021-01-29 DIAGNOSIS — N1831 Chronic kidney disease, stage 3a: Secondary | ICD-10-CM | POA: Diagnosis not present

## 2021-01-29 DIAGNOSIS — E1159 Type 2 diabetes mellitus with other circulatory complications: Secondary | ICD-10-CM | POA: Diagnosis not present

## 2021-01-29 DIAGNOSIS — E876 Hypokalemia: Secondary | ICD-10-CM | POA: Insufficient documentation

## 2021-01-29 DIAGNOSIS — I10 Essential (primary) hypertension: Secondary | ICD-10-CM

## 2021-01-29 DIAGNOSIS — I152 Hypertension secondary to endocrine disorders: Secondary | ICD-10-CM

## 2021-01-29 HISTORY — DX: Hypokalemia: E87.6

## 2021-01-29 MED ORDER — LOSARTAN POTASSIUM-HCTZ 50-12.5 MG PO TABS: 1.0000 | ORAL_TABLET | Freq: Every day | ORAL | 1 refills | Status: DC

## 2021-01-29 NOTE — Patient Instructions (Signed)
It was very nice to see you today!  I have sent the new blood pressure medication to your pharmacy, you can start this today.  STOP your Triamterene-HCTZ.  Return in 1 month to recheck your blood work.  PLEASE NOTE:  If you had any lab tests please let us know if you have not heard back within a few days. You may see your results on MyChart before we have a chance to review them but we will give you a call once they are reviewed by [place]. If we ordered any referrals today, please let us know if you have not heard from their office within the next week.   Please try these tips to maintain a healthy lifestyle:  Eat most of your calories during the day when you are active. Eliminate processed foods including packaged sweets (pies, cakes, cookies), reduce intake of potatoes, white bread, white pasta, and white rice. Look for whole grain options, oat flour or almond flour.  Each meal should contain half fruits/vegetables, one quarter protein, and one quarter carbs (no bigger than a computer mouse).  Cut down on sweet beverages. This includes juice, soda, and sweet tea. Also watch fruit intake, though this is a healthier sweet option, it still contains natural sugar! Limit to 3 servings daily.  Drink at least 1 glass of water with each meal and aim for at least 8 glasses per day  Exercise at least 150 minutes every week.

## 2021-01-29 NOTE — Progress Notes (Signed)
Patient came into the office on 01/29/2021 to discuss results and concerns with provider.

## 2021-01-29 NOTE — Assessment & Plan Note (Signed)
3.3 1 week ago, yesterday 3.4, stopping Triamterene/HCTZ. Pt advised on increasing water intake.

## 2021-01-29 NOTE — Progress Notes (Signed)
Subjective:     Patient ID: Misty Cervantes, female    DOB: June 21, 1945, 75 y.o.   MRN: [id number]  Chief Complaint  Patient presents with   Review lab results   Chronic Kidney Disease   Hypertension     Hypertension  Hypertension: Patient is currently maintained on the following medications for blood pressure: Triamterene/HCTZ Failed meds include Amlodipine (swelling) Patient reports good compliance with blood pressure medications. Patient Last 3 blood pressure readings in our office are as follows: BP Readings from Last 3 Encounters:  01/29/21 126/72  01/22/21 130/80  09/30/20 134/84   Hypokalemia:  last 2 results are low. Pt denies any symptoms. Denies taking more than 1 Triamterene/HCTZ daily. Pt reports she does not drink enough water.  CKD Stage 3:  last 2 GFR readings 46; denies taking daily NSAIDs; reports drinking water, but not 64oz qd; denies taking any new OTC or prescribed meds.denies chest pain, shortness of breath or swelling. Last A1C 7.6.    Health Maintenance Due  Topic Date Due   TETANUS/TDAP  Never done   OPHTHALMOLOGY EXAM  08/06/2017   Pneumonia Vaccine 28+ Years old (3 - PCV) 04/12/2019    Past Medical History:  Diagnosis Date   Chronic kidney disease (CKD), stage III (moderate) ([place])    followed by pcp   Full dentures    History of 2019 novel coronavirus disease (COVID-19) 04/05/2019   result in care everywhere ,  per pt mild symptoms that resolved   History of gastric ulcer 2013   non-bleeding   Hyperlipemia    Mixed stress and urge incontinence    Osteoarthritis    Presence of pessary    Type 2 diabetes mellitus ([place])    followed by pcp   (08-26-2020 per pt checks blood sugar twice weekly, unsure what fasting sugar is)   Vaginal vault 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    gyn--- Misty Cervantes, using pessary   Wears glasses     Past Surgical History:  Procedure Laterality Date   CARPAL TUNNEL RELEASE Right    1980s   CATARACT EXTRACTION W/  INTRAOCULAR LENS IMPLANT Bilateral 2013   approx   COLONOSCOPY WITH ESOPHAGOGASTRODUODENOSCOPY (EGD)  11/17/2011   COLPOCLEISIS N/A 09/02/2020   Procedure: COLPOCLEISIS, PERINEORRHAPHY;  Surgeon: Misty Cervantes;  Location: [place];  Service: Gynecology;  Laterality: N/A;   CYSTOSCOPY N/A 09/02/2020   Procedure: CYSTOSCOPY;  Surgeon: Misty Cervantes;  Location: [facility];  Service: Gynecology;  Laterality: N/A;   RETINAL DETACHMENT SURGERY Right 2003   TOTAL HIP ARTHROPLASTY Left 03/02/2010   @[facility]    TOTAL KNEE ARTHROPLASTY Right 11/03/2015   Procedure: RIGHT TOTAL KNEE ARTHROPLASTY;  Surgeon: Misty Cervantes;  Location: [facility];  Service: Orthopedics;  Laterality: Right;   TOTAL VAGINAL HYSTERECTOMY  2012   TVH, anterior repair, TVT (no mesh used for the anterior repair)    Outpatient Medications Prior to Visit  Medication Sig Dispense Refill   Accu-Chek FastClix Lancets MISC Check blood sugars 1-2 times per day. 100 each 2   acetaminophen (TYLENOL) 325 MG tablet Take 650 mg by mouth as needed.     albuterol (VENTOLIN HFA) 108 (90 Base) MCG/ACT inhaler TAKE 2 PUFFS BY MOUTH EVERY 6 HOURS AS NEEDED FOR WHEEZE OR SHORTNESS OF BREATH 6.7 each 1   aspirin EC 81 MG tablet Take 81 mg by mouth daily. Swallow whole.     Cyanocobalamin (B-12 PO) Take 1 tablet by mouth  daily.     fluticasone (FLONASE) 50 MCG/ACT nasal spray SPRAY 1 SPRAY INTO EACH NOSTRIL TWICE A DAY AS NEEDED FOR ALLERGIES OR RHINITIS 48 mL 0   glipiZIDE (GLUCOTROL XL) 5 MG 24 hr tablet TAKE 1 TABLET BY MOUTH EVERY DAY WITH BREAKFAST 90 tablet 1   glucose blood (ACCU-CHEK GUIDE) test strip TEST FOUR TIMES DAILY 100 strip 0   meclizine (ANTIVERT) 25 MG tablet TAKE 1 TABLET BY MOUTH 3 TIMES DAILY AS NEEDED FOR DIZZINESS. 60 tablet 1   Multiple Vitamin (MULTIVITAMIN WITH MINERALS) TABS tablet Take 1 tablet by mouth daily.     phentermine (ADIPEX-P) 37.5 MG tablet Take 37.5 mg by  mouth daily as needed.     pravastatin (PRAVACHOL) 40 MG tablet Take 1 tablet (40 mg total) by mouth daily. 90 tablet 1   traZODone (DESYREL) 50 MG tablet TAKE 1/2 TO 1 TABLET BY MOUTH AT BEDTIME AS NEEDED FOR SLEEP (Patient taking differently: Take 50 mg by mouth at bedtime as needed. TAKE 1/2 TO 1 TABLET BY MOUTH AT BEDTIME AS NEEDED FOR SLEEP) 90 tablet 2   triamterene-hydrochlorothiazide (DYAZIDE) 37.5-25 MG capsule TAKE 1 EACH (1 CAPSULE TOTAL) BY MOUTH DAILY. 90 capsule 1   No facility-administered medications prior to visit.    Allergies  Allergen Reactions   Norvasc [Amlodipine] Swelling        Objective:    Physical Exam Vitals and nursing note reviewed.  Constitutional:      Appearance: Normal appearance.  Cardiovascular:     Rate and Rhythm: Normal rate and regular rhythm.  Pulmonary:     Effort: Pulmonary effort is normal.     Breath sounds: Normal breath sounds.  Musculoskeletal:        General: Normal range of motion.  Skin:    General: Skin is warm and dry.  Neurological:     Mental Status: She is alert.  Psychiatric:        Mood and Affect: Mood normal.        Behavior: Behavior normal.    BP 126/72   Pulse 96   Temp 98.1 F (36.7 C) (Temporal)   Ht 4\' 10"  (1.473 m)   Wt 147 lb 4 oz (66.8 kg)   LMP  (LMP Unknown)   SpO2 96%   BMI 30.78 kg/m  Wt Readings from Last 3 Encounters:  01/29/21 147 lb 4 oz (66.8 kg)  01/22/21 149 lb (67.6 kg)  09/30/20 145 lb 12.8 oz (66.1 kg)       Assessment & Plan:   Problem List Items Addressed This Visit       Cardiovascular and Mediastinum   Hypertension associated with diabetes ([place])    Last 2 potassium levels low, will stop Triamterene/HCTZ, starting Losartan/HCTZ, pt advised on use and SE.      Relevant Medications   losartan-hydrochlorothiazide (HYZAAR) 50-12.5 MG tablet     Genitourinary   Chronic kidney disease    Pt wanted to discuss DX today, had not been told in past about this. Advised she  can improve her numbers with increased water intake, states she does not drink water during day except to take pills. Advised on effects of uncontrolled diabetes and HTN on CKD. Handout provided reinforcing teaching today.        Other   Low serum potassium level - Primary    3.3 1 week ago, yesterday 3.4, stopping Triamterene/HCTZ. Pt advised on increasing water intake.      Time spent with  patient today was 30 minutes which consisted of chart review, discussing diagnoses, work up, treatment, answering questions, and documentation.   Meds ordered this encounter  Medications   losartan-hydrochlorothiazide (HYZAAR) 50-12.5 MG tablet    Sig: Take 1 tablet by mouth daily.    Dispense:  90 tablet    Refill:  1    D/C Triamterene-HCTZ    Order Specific Question:   Supervising Provider    Answer:   ANDY, CAMILLE L [[id number]]

## 2021-01-29 NOTE — Assessment & Plan Note (Signed)
Pt wanted to discuss DX today, had not been told in past about this. Advised she can improve her numbers with increased water intake, states she does not drink water during day except to take pills. Advised on effects of uncontrolled diabetes and HTN on CKD. Handout provided reinforcing teaching today.

## 2021-01-29 NOTE — Assessment & Plan Note (Signed)
Last 2 potassium levels low, will stop Triamterene/HCTZ, starting Losartan/HCTZ, pt advised on use and SE.

## 2021-04-03 ENCOUNTER — Other Ambulatory Visit: Payer: Self-pay | Admitting: Family Medicine

## 2021-04-03 DIAGNOSIS — R42 Dizziness and giddiness: Secondary | ICD-10-CM

## 2021-04-15 ENCOUNTER — Other Ambulatory Visit: Payer: Self-pay

## 2021-04-15 DIAGNOSIS — J069 Acute upper respiratory infection, unspecified: Secondary | ICD-10-CM

## 2021-04-15 MED ORDER — FLUTICASONE PROPIONATE 50 MCG/ACT NA SUSP: NASAL | 0 refills | Status: DC

## 2021-04-27 IMAGING — CT CT ABD-PELV W/ CM
2 of 5 series · 13 of 36 positions shown, 16 images · IV contrast (omnipaque)
Comparison: None.

CLINICAL DATA: Recent trip and fall with hip pain, initial
encounter

EXAM:
CT CHEST, ABDOMEN, AND PELVIS WITH CONTRAST
TECHNIQUE: Multidetector CT imaging of the chest, abdomen and pelvis was
performed following the standard protocol during bolus
administration of intravenous contrast.
CONTRAST:  100mL OMNIPAQUE IOHEXOL 300 MG/ML  SOLN

[Series 3: cap with · axial · 0.90mm/px · z∈[-575,-20]mm · 10 of 137 slices shown, 13 images]
[im 13/137  mediastinal]
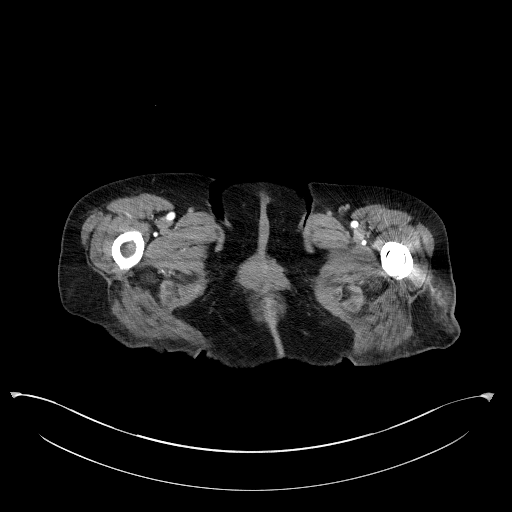
[im 13/137  lung]
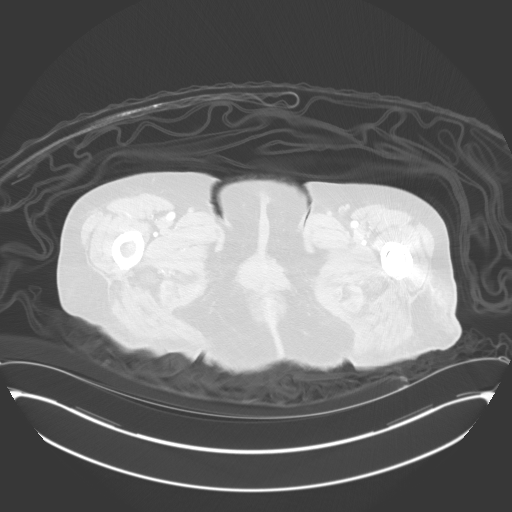
[im 25/137  lung]
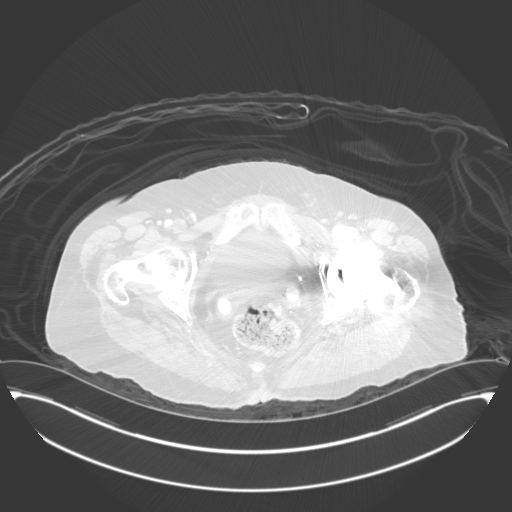
[im 38/137  lung]
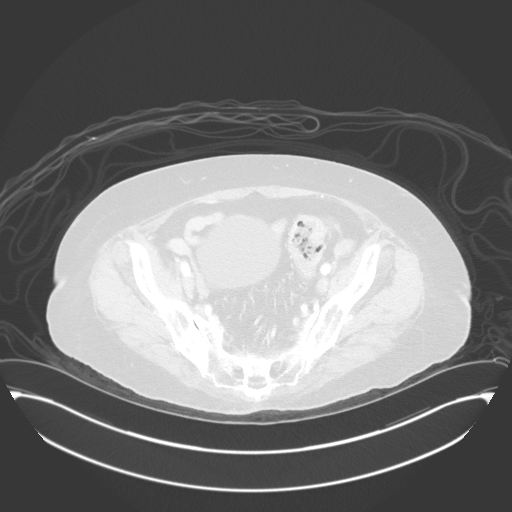
[im 50/137  lung]
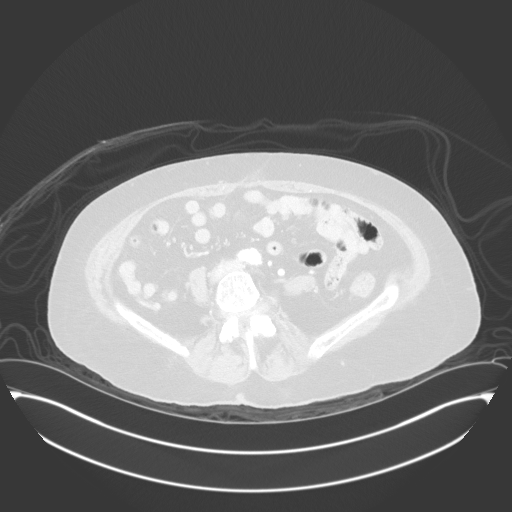
[im 62/137  mediastinal]
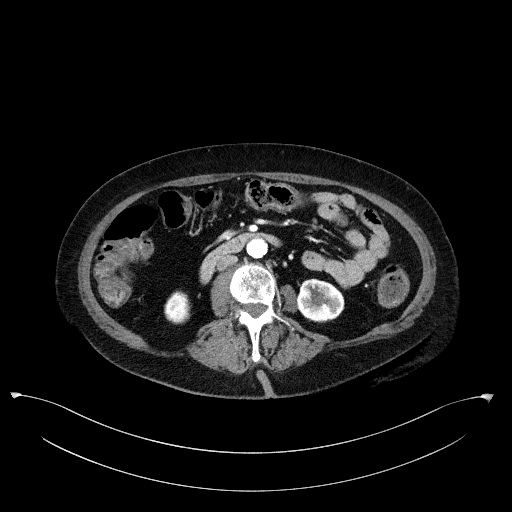
[im 62/137  lung]
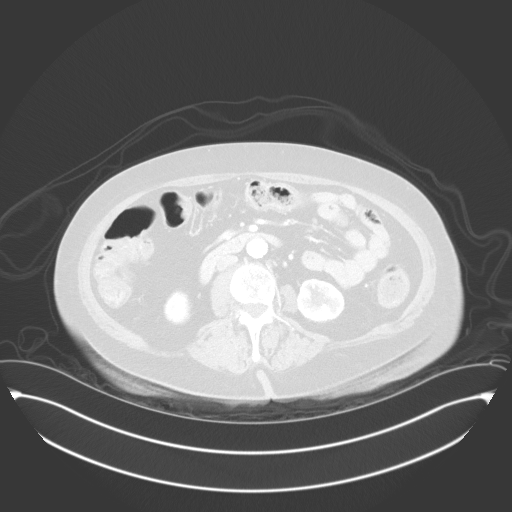
[im 75/137  lung]
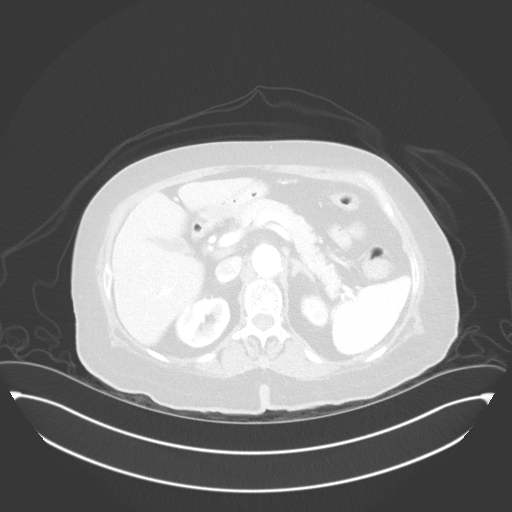
[im 87/137  lung]
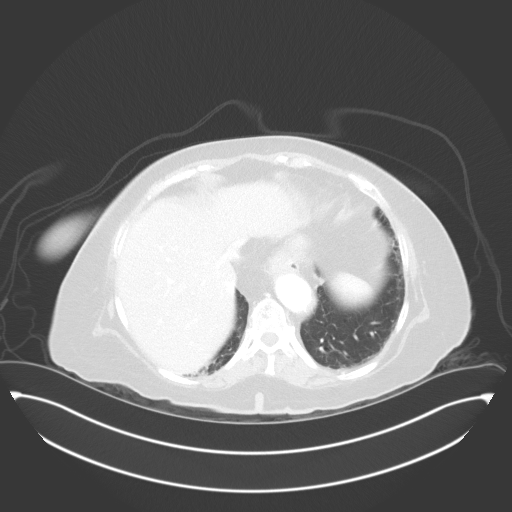
[im 99/137  lung]
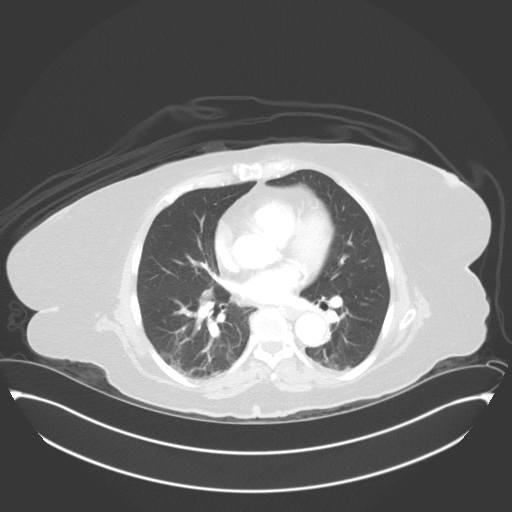
[im 112/137  mediastinal]
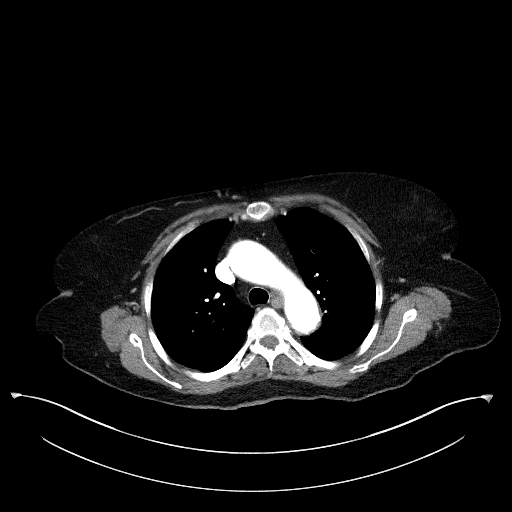
[im 112/137  lung]
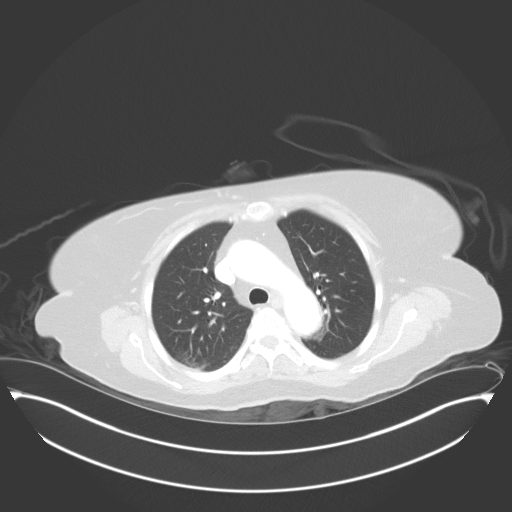
[im 124/137  lung]
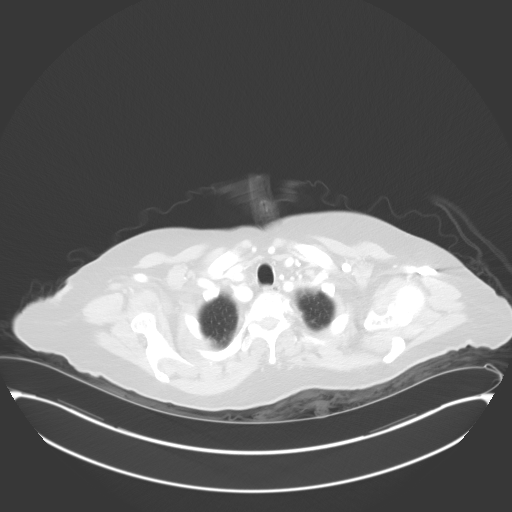

[Series 5: coronals · coronal · 0.82mm/px · 3 of 131 slices shown]
[im 27/131  lung]
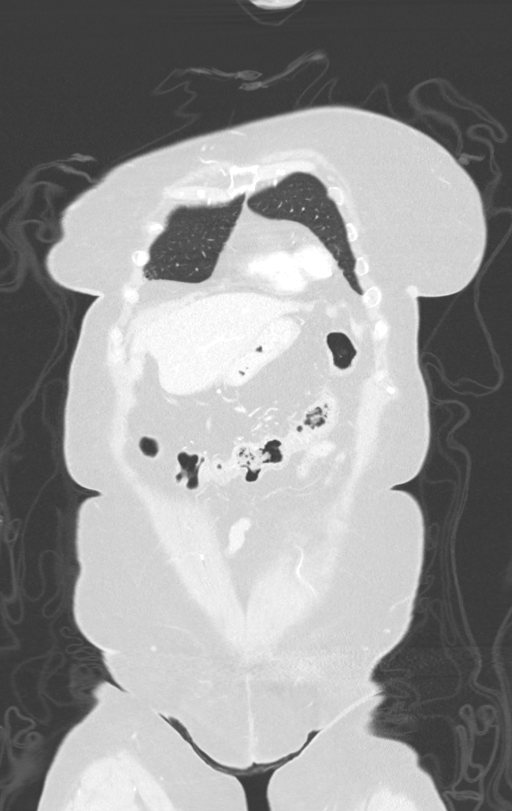
[im 53/131  lung]
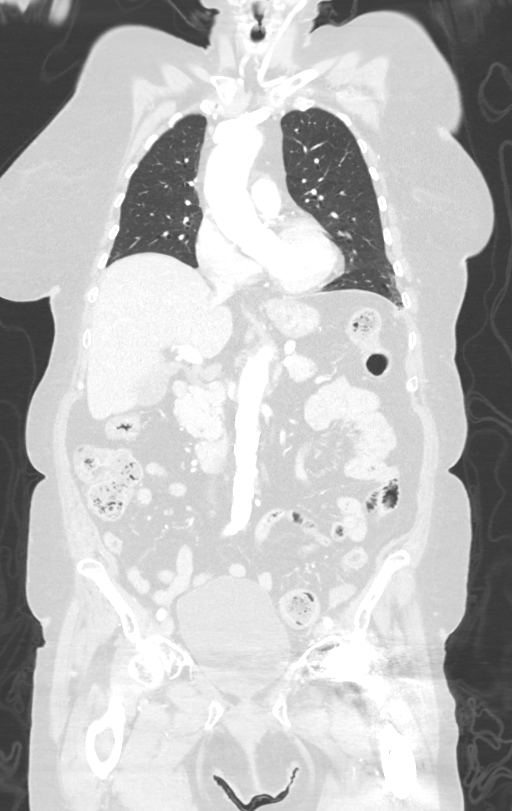
[im 79/131  lung]
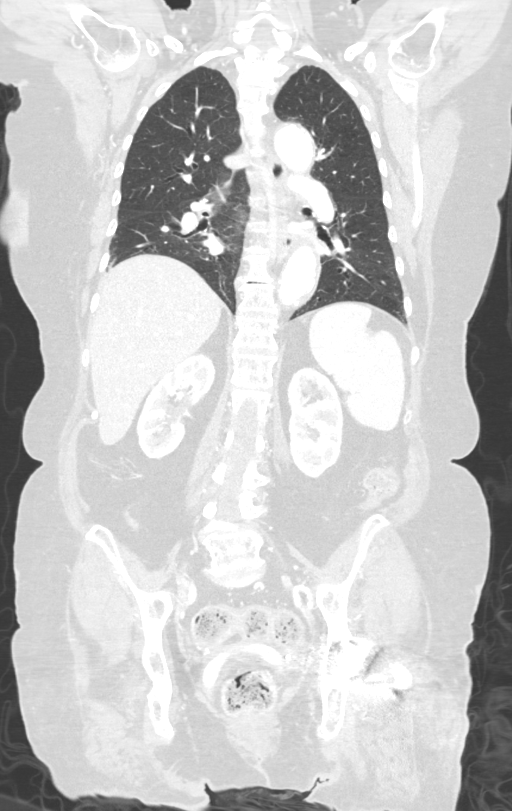

[13 of 36 positions shown; findings below may reference images not displayed]

FINDINGS: CT CHEST FINDINGS

Cardiovascular: Thoracic aorta demonstrates atherosclerotic
calcifications without aneurysmal dilatation or dissection. No
cardiac enlargement is seen. No large central pulmonary embolus is
noted.

Mediastinum/Nodes: The thoracic inlet is within normal limits. No
hilar or mediastinal adenopathy is seen. The esophagus as visualized
is within normal limits.

Lungs/Pleura: The lungs are well aerated bilaterally. Some minimal
basilar scarring is seen. No sizable effusion or pneumothorax is
noted.

Musculoskeletal: Mild degenerative change of the thoracic spine is
seen. No rib fractures are seen. No compression deformity is noted.

CT ABDOMEN PELVIS FINDINGS

Hepatobiliary: Fatty infiltration of the liver is noted. The
gallbladder is well distended without focal abnormality.

Pancreas: Unremarkable. No pancreatic ductal dilatation or
surrounding inflammatory changes.

Spleen: Normal in size without focal abnormality.

Adrenals/Urinary Tract: Adrenal glands demonstrate no focal mass
lesion. The kidneys demonstrate a normal enhancement pattern. No
renal calculi or obstructive changes are seen. Delayed images
demonstrate normal excretion of contrast material. The bladder is
well distended.

Stomach/Bowel: The appendix is within normal limits. No obstructive
or inflammatory changes of the colon are noted. No gastric or small
bowel abnormality is seen.

Vascular/Lymphatic: Aortic atherosclerosis. No enlarged abdominal or
pelvic lymph nodes.

Reproductive: Uterus has been surgically removed. Pessary is noted
in place.

Other: No abdominal wall hernia or abnormality. No abdominopelvic
ascites.

Musculoskeletal: Some subcutaneous edema is noted likely related to
bruising along the lateral aspect of the left hip consistent with
the recent injury. No compression deformities are seen. Degenerative
changes of the lumbar spine are noted. Postsurgical changes in the
left hip are seen. No definitive fracture is noted.
IMPRESSION: Soft tissue changes adjacent to the left hip consistent with the
recent injury and some bruising.

Chronic changes as described above. No other acute posttraumatic
abnormality is noted

## 2021-05-28 ENCOUNTER — Other Ambulatory Visit: Payer: Self-pay | Admitting: Family

## 2021-07-09 ENCOUNTER — Telehealth: Payer: Self-pay | Admitting: Family

## 2021-07-09 NOTE — Telephone Encounter (Signed)
Copied from [place] [phone]. Topic: Medicare AWV ?>> Jul 09, 2021  2:23 PM Harris-Coley, Hannah Beat wrote: ?Reason for CRM: LVM 07/09/21 to r/s AWV appt.  New AWV appt date 07/16/21'@11'$ :45am.  Please confirm appt change- [facility] In person AWV  last AWV 07/11/20 ?

## 2021-07-16 ENCOUNTER — Ambulatory Visit (INDEPENDENT_AMBULATORY_CARE_PROVIDER_SITE_OTHER): Payer: Medicare Other

## 2021-07-16 ENCOUNTER — Telehealth: Payer: Self-pay

## 2021-07-16 ENCOUNTER — Other Ambulatory Visit: Payer: Self-pay | Admitting: Family

## 2021-07-16 VITALS — BP 120/68 | HR 78 | Temp 98.4°F | Wt 157.6 lb

## 2021-07-16 DIAGNOSIS — Z634 Disappearance and death of family member: Secondary | ICD-10-CM

## 2021-07-16 DIAGNOSIS — E785 Hyperlipidemia, unspecified: Secondary | ICD-10-CM

## 2021-07-16 DIAGNOSIS — Z0000 Encounter for general adult medical examination without abnormal findings: Secondary | ICD-10-CM | POA: Diagnosis not present

## 2021-07-16 DIAGNOSIS — F4321 Adjustment disorder with depressed mood: Secondary | ICD-10-CM | POA: Diagnosis not present

## 2021-07-16 NOTE — Chronic Care Management (AMB) (Signed)
?  Chronic Care Management  ? ?Outreach Note ? ?07/16/2021 ?Name: Misty Cervantes MRN: [id number] DOB: Feb 21, 1946 ? ?Misty Cervantes is a 76 y.o. year old female who is a primary care patient of Jeanie Sewer, NP. I reached out to Barron Alvine by phone today in response to a referral sent by [name]'s primary care provider. ? ?An unsuccessful telephone outreach was attempted today. The patient was referred to the case management team for assistance with care management and care coordination.  ? ?Follow Up Plan: A HIPAA compliant phone message was left for the patient providing contact information and requesting a return call.  ?The care management team will reach out to the patient again over the next 1 days.  ?If patient returns call to provider office, please advise to call [place] * at [phone]* ? ?Misty Cervantes, RMA ?Care Guide, Embedded Care Coordination ?[facility]  ?[place], [place] [address] ?Direct Dial: [phone] ?[email or web address] ?[email or web address]: [email or web address]  ? ?

## 2021-07-16 NOTE — Progress Notes (Addendum)
? ?Subjective:  ? Misty Cervantes is a 76 y.o. female who presents for Medicare Annual (Subsequent) preventive examination. ? ?Review of Systems    ? ?Cardiac Risk Factors include: advanced age (>34mn, >>55women);diabetes mellitus;dyslipidemia;hypertension;obesity (BMI >30kg/m2) ? ?   ?Objective:  ?  ?Today's Vitals  ? 07/16/21 1201  ?BP: 120/68  ?Pulse: 78  ?Temp: 98.4 ?F (36.9 ?C)  ?SpO2: 94%  ?Weight: 157 lb 9.6 oz (71.5 kg)  ? ?Body mass index is 32.94 kg/m?. ? ? ?  07/16/2021  ? 12:23 PM 09/02/2020  ?  6:11 AM 07/11/2020  ? 11:55 AM 06/20/2019  ?  9:38 AM 05/24/2019  ?  1:43 PM 03/01/2019  ?  6:40 PM 10/21/2017  ?  2:44 PM  ?Advanced Directives  ?Does Patient Have a Medical Advance Directive? No No Yes No No No Yes  ?Does patient want to make changes to medical advance directive?   Yes (MAU/Ambulatory/Procedural Areas - Information given)  Yes (MAU/Ambulatory/Procedural Areas - Information given)    ?Would patient like information on creating a medical advance directive? Yes (MAU/Ambulatory/Procedural Areas - Information given) No - Patient declined  No - Patient declined  No - Patient declined   ? ? ?Current Medications (verified) ?Outpatient Encounter Medications as of 07/16/2021  ?Medication Sig  ? Accu-Chek FastClix Lancets MISC Check blood sugars 1-2 times per day.  ? acetaminophen (TYLENOL) 325 MG tablet Take 650 mg by mouth as needed.  ? aspirin EC 81 MG tablet Take 81 mg by mouth daily. Swallow whole.  ? Cyanocobalamin (B-12 PO) Take 1 tablet by mouth daily.  ? fluticasone (FLONASE) 50 MCG/ACT nasal spray SPRAY 1 SPRAY INTO EACH NOSTRIL TWICE A DAY AS NEEDED FOR ALLERGIES OR RHINITIS  ? glipiZIDE (GLUCOTROL XL) 5 MG 24 hr tablet TAKE 1 TABLET BY MOUTH EVERY DAY WITH BREAKFAST  ? glucose blood (ACCU-CHEK GUIDE) test strip TEST FOUR TIMES DAILY  ? losartan-hydrochlorothiazide (HYZAAR) 50-12.5 MG tablet Take 1 tablet by mouth daily.  ? meclizine (ANTIVERT) 25 MG tablet TAKE 1 TABLET BY MOUTH 3 TIMES A DAY AS NEEDED  FOR DIZZINESS  ? Multiple Vitamin (MULTIVITAMIN WITH MINERALS) TABS tablet Take 1 tablet by mouth daily.  ? pravastatin (PRAVACHOL) 40 MG tablet TAKE 1 TABLET BY MOUTH EVERY DAY  ? traZODone (DESYREL) 50 MG tablet TAKE 1/2 TO 1 TABLET BY MOUTH AT BEDTIME AS NEEDED FOR SLEEP (Patient taking differently: Take 50 mg by mouth at bedtime as needed. TAKE 1/2 TO 1 TABLET BY MOUTH AT BEDTIME AS NEEDED FOR SLEEP)  ? albuterol (VENTOLIN HFA) 108 (90 Base) MCG/ACT inhaler TAKE 2 PUFFS BY MOUTH EVERY 6 HOURS AS NEEDED FOR WHEEZE OR SHORTNESS OF BREATH (Patient not taking: Reported on 07/16/2021)  ? phentermine (ADIPEX-P) 37.5 MG tablet Take 37.5 mg by mouth daily as needed. (Patient not taking: Reported on 07/16/2021)  ? ?No facility-administered encounter medications on file as of 07/16/2021.  ? ? ?Allergies (verified) ?Norvasc [amlodipine]  ? ?History: ?Past Medical History:  ?Diagnosis Date  ? Chronic kidney disease (CKD), stage III (moderate) (HCC)   ? followed by pcp  ? Full dentures   ? History of 2019 novel coronavirus disease (COVID-19) 04/05/2019  ? result in care everywhere ,  per pt mild symptoms that resolved  ? History of gastric ulcer 2013  ? non-bleeding  ? Hyperlipemia   ? Mixed stress and urge incontinence   ? Osteoarthritis   ? Presence of pessary   ? Type 2 diabetes mellitus (  [place])   ? followed by pcp   (08-26-2020 per pt checks blood sugar twice weekly, unsure what fasting sugar is)  ? Vaginal vault 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   ? gyn--- dr Talbert Nan, using pessary  ? Wears glasses   ? ?Past Surgical History:  ?Procedure Laterality Date  ? CARPAL TUNNEL RELEASE Right   ? 1980s  ? CATARACT EXTRACTION W/ INTRAOCULAR LENS IMPLANT Bilateral 2013  ? approx  ? COLONOSCOPY WITH ESOPHAGOGASTRODUODENOSCOPY (EGD)  11/17/2011  ? COLPOCLEISIS N/A 09/02/2020  ? Procedure: COLPOCLEISIS, PERINEORRHAPHY;  Surgeon: Salvadore Dom, MD;  Location: [facility];  Service: Gynecology;  Laterality: N/A;  ? CYSTOSCOPY  N/A 09/02/2020  ? Procedure: CYSTOSCOPY;  Surgeon: Salvadore Dom, MD;  Location: [facility];  Service: Gynecology;  Laterality: N/A;  ? RETINAL DETACHMENT SURGERY Right 2003  ? TOTAL HIP ARTHROPLASTY Left 03/02/2010  ? '@[facility]   ? TOTAL KNEE ARTHROPLASTY Right 11/03/2015  ? Procedure: RIGHT TOTAL KNEE ARTHROPLASTY;  Surgeon: Gaynelle Arabian, MD;  Location: [facility];  Service: Orthopedics;  Laterality: Right;  ? TOTAL VAGINAL HYSTERECTOMY  2012  ? TVH, anterior repair, TVT (no mesh used for the anterior repair)  ? ?Family History  ?Problem Relation Age of Onset  ? Diabetes Mother   ?     deceased  ? Heart attack Mother   ? Diabetes Sister   ?     x2  ? Kidney cancer Father   ? Heart attack Father   ? Diabetes Sister   ? ?Social History  ? ?Socioeconomic History  ? Marital status: Divorced  ?  Spouse name: Not on file  ? Number of children: Not on file  ? Years of education: Not on file  ? Highest education level: Not on file  ?Occupational History  ? Occupation: retired  ?  Employer: RETIRED  ?Tobacco Use  ? Smoking status: Never  ? Smokeless tobacco: Never  ?Vaping Use  ? Vaping Use: Never used  ?Substance and Sexual Activity  ? Alcohol use: No  ? Drug use: No  ? Sexual activity: Not Currently  ?  Partners: Male  ?  Birth control/protection: Surgical  ?  Comment: hysterectomy  ?Other Topics Concern  ? Not on file  ?Social History Narrative  ? Divorced, 2 children; retired.   ? Custody of grandchildren   ? ?Social Determinants of Health  ? ?Financial Resource Strain: Low Risk   ? Difficulty of Paying Living Expenses: Not hard at all  ?Food Insecurity: No Food Insecurity  ? Worried About Charity fundraiser in the Last Year: Never true  ? Ran Out of Food in the Last Year: Never true  ?Transportation Needs: No Transportation Needs  ? Lack of Transportation (Medical): No  ? Lack of Transportation (Non-Medical): No  ?Physical Activity: Inactive  ? Days of Exercise per Week: 0 days  ? Minutes of Exercise per  Session: 0 min  ?Stress: Stress Concern Present  ? Feeling of Stress : To some extent  ?Social Connections: Socially Isolated  ? Frequency of Communication with Friends and Family: More than three times a week  ? Frequency of Social Gatherings with Friends and Family: More than three times a week  ? Attends Religious Services: Never  ? Active Member of Clubs or Organizations: No  ? Attends Archivist Meetings: Never  ? Marital Status: Divorced  ? ? ?Tobacco Counseling ?Counseling given: Not Answered ? ? ?Clinical Intake: ? ?Pre-visit preparation completed: Yes ? ?  Pain : No/denies pain ? ?  ? ?BMI - recorded: 32.94 ?Nutritional Status: BMI > 30  Obese ?Nutritional Risks: None ?Diabetes: Yes ?CBG done?: Yes (206 per pt before meals) ?CBG resulted in Enter/ Edit results?: No ?Did pt. bring in CBG monitor from home?: No ? ?How often do you need to have someone help you when you read instructions, pamphlets, or other written materials from your doctor or pharmacy?: 1 - Never ? ?Diabetic?Nutrition Risk Assessment: ? ?Has the patient had any N/V/D within the last 2 months?  No  ?Does the patient have any non-healing wounds?  No  ?Has the patient had any unintentional weight loss or weight gain?  No  ? ?Diabetes: ? ?Is the patient diabetic?  Yes  ?If diabetic, was a CBG obtained today?  Yes  ?Did the patient bring in their glucometer from home?  No  ?How often do you monitor your CBG's? Daily.  ? ?Financial Strains and Diabetes Management: ? ?Are you having any financial strains with the device, your supplies or your medication? No .  ?Does the patient want to be seen by Chronic Care Management for management of their diabetes?  No  ?Would the patient like to be referred to a Nutritionist or for Diabetic Management?  No  ? ?Diabetic Exams: ? ?Diabetic Eye Exam: Overdue for diabetic eye exam. Pt has been advised about the importance in completing this exam. Patient advised to call and schedule an eye  exam. ?Diabetic Foot Exam: Overdue, Pt has been advised about the importance in completing this exam. Pt is scheduled for diabetic foot exam on next appt . ? ? ?Interpreter Needed?: No ? ?Information entered by :: T

## 2021-07-16 NOTE — Patient Instructions (Signed)
Ms. Rosch , ?Thank you for taking time to come for your Medicare Wellness Visit. I appreciate your ongoing commitment to your health goals. Please review the following plan we discussed and let me know if I can assist you in the future.  ? ?Screening recommendations/referrals: ?Colonoscopy: No longer required  ?Mammogram: Done 09/16/20 repeat every year  ?Bone Density: Done 08/26/16 repeat every 3-5 years  ?Recommended yearly ophthalmology/optometry visit for glaucoma screening and checkup ?Recommended yearly dental visit for hygiene and checkup ? ?Vaccinations: ?Influenza vaccine: Due  ?Pneumococcal vaccine: Due and discussed  ?Tdap vaccine: Discontinue ?Shingles vaccine: Completed 1/12, 11/19/18   ?Covid-19:Completed 5/29, 09/02/19 & 05/28/20 ? ?Advanced directives: Advance directive discussed with you today. I have provided a copy for you to complete at home and have notarized. Once this is complete please bring a copy in to our office so we can scan it into your chart. ? ?Conditions/risks identified: lose weight  ? ?Next appointment: Follow up in one year for your annual wellness visit  ? ? ?Preventive Care 80 Years and Older, Female ?Preventive care refers to lifestyle choices and visits with your health care provider that can promote health and wellness. ?What does preventive care include? ?A yearly physical exam. This is also called an annual well check. ?Dental exams once or twice a year. ?Routine eye exams. Ask your health care provider how often you should have your eyes checked. ?Personal lifestyle choices, including: ?Daily care of your teeth and gums. ?Regular physical activity. ?Eating a healthy diet. ?Avoiding tobacco and drug use. ?Limiting alcohol use. ?Practicing safe sex. ?Taking low-dose aspirin every day. ?Taking vitamin and mineral supplements as recommended by your health care provider. ?What happens during an annual well check? ?The services and screenings done by your health care provider during  your annual well check will depend on your age, overall health, lifestyle risk factors, and family history of disease. ?Counseling  ?Your health care provider may ask you questions about your: ?Alcohol use. ?Tobacco use. ?Drug use. ?Emotional well-being. ?Home and relationship well-being. ?Sexual activity. ?Eating habits. ?History of falls. ?Memory and ability to understand (cognition). ?Work and work Statistician. ?Reproductive health. ?Screening  ?You may have the following tests or measurements: ?Height, weight, and BMI. ?Blood pressure. ?Lipid and cholesterol levels. These may be checked every 5 years, or more frequently if you are over 67 years old. ?Skin check. ?Lung cancer screening. You may have this screening every year starting at age 72 if you have a 30-pack-year history of smoking and currently smoke or have quit within the past 15 years. ?Fecal occult blood test (FOBT) of the stool. You may have this test every year starting at age 59. ?Flexible sigmoidoscopy or colonoscopy. You may have a sigmoidoscopy every 5 years or a colonoscopy every 10 years starting at age 45. ?Hepatitis C blood test. ?Hepatitis B blood test. ?Sexually transmitted disease (STD) testing. ?Diabetes screening. This is done by checking your blood sugar (glucose) after you have not eaten for a while (fasting). You may have this done every 1-3 years. ?Bone density scan. This is done to screen for osteoporosis. You may have this done starting at age 65. ?Mammogram. This may be done every 1-2 years. Talk to your health care provider about how often you should have regular mammograms. ?Talk with your health care provider about your test results, treatment options, and if necessary, the need for more tests. ?Vaccines  ?Your health care provider may recommend certain vaccines, such as: ?Influenza vaccine. This  is recommended every year. ?Tetanus, diphtheria, and acellular pertussis (Tdap, Td) vaccine. You may need a Td booster every 10  years. ?Zoster vaccine. You may need this after age 32. ?Pneumococcal 13-valent conjugate (PCV13) vaccine. One dose is recommended after age 80. ?Pneumococcal polysaccharide (PPSV23) vaccine. One dose is recommended after age 50. ?Talk to your health care provider about which screenings and vaccines you need and how often you need them. ?This information is not intended to replace advice given to you by your health care provider. Make sure you discuss any questions you have with your health care provider. ?Document Released: 03/28/2015 Document Revised: 11/19/2015 Document Reviewed: 12/31/2014 ?Elsevier Interactive Patient Education ? 2017 [place]. ? ?Fall Prevention in the Home ?Falls can cause injuries. They can happen to people of all ages. There are many things you can do to make your home safe and to help prevent falls. ?What can I do on the outside of my home? ?Regularly fix the edges of walkways and driveways and fix any cracks. ?Remove anything that might make you trip as you walk through a door, such as a raised step or threshold. ?Trim any bushes or trees on the path to your home. ?Use bright outdoor lighting. ?Clear any walking paths of anything that might make someone trip, such as rocks or tools. ?Regularly check to see if handrails are loose or broken. Make sure that both sides of any steps have handrails. ?Any raised decks and porches should have guardrails on the edges. ?Have any leaves, snow, or ice cleared regularly. ?Use sand or salt on walking paths during winter. ?Clean up any spills in your garage right away. This includes oil or grease spills. ?What can I do in the bathroom? ?Use night lights. ?Install grab bars by the toilet and in the tub and shower. Do not use towel bars as grab bars. ?Use non-skid mats or decals in the tub or shower. ?If you need to sit down in the shower, use a plastic, non-slip stool. ?Keep the floor dry. Clean up any water that spills on the floor as soon as it  happens. ?Remove soap buildup in the tub or shower regularly. ?Attach bath mats securely with double-sided non-slip rug tape. ?Do not have throw rugs and other things on the floor that can make you trip. ?What can I do in the bedroom? ?Use night lights. ?Make sure that you have a light by your bed that is easy to reach. ?Do not use any sheets or blankets that are too big for your bed. They should not hang down onto the floor. ?Have a firm chair that has side arms. You can use this for support while you get dressed. ?Do not have throw rugs and other things on the floor that can make you trip. ?What can I do in the kitchen? ?Clean up any spills right away. ?Avoid walking on wet floors. ?Keep items that you use a lot in easy-to-reach places. ?If you need to reach something above you, use a strong step stool that has a grab bar. ?Keep electrical cords out of the way. ?Do not use floor polish or wax that makes floors slippery. If you must use wax, use non-skid floor wax. ?Do not have throw rugs and other things on the floor that can make you trip. ?What can I do with my stairs? ?Do not leave any items on the stairs. ?Make sure that there are handrails on both sides of the stairs and use them. Fix handrails that  are broken or loose. Make sure that handrails are as long as the stairways. ?Check any carpeting to make sure that it is firmly attached to the stairs. Fix any carpet that is loose or worn. ?Avoid having throw rugs at the top or bottom of the stairs. If you do have throw rugs, attach them to the floor with carpet tape. ?Make sure that you have a light switch at the top of the stairs and the bottom of the stairs. If you do not have them, ask someone to add them for you. ?What else can I do to help prevent falls? ?Wear shoes that: ?Do not have high heels. ?Have rubber bottoms. ?Are comfortable and fit you well. ?Are closed at the toe. Do not wear sandals. ?If you use a stepladder: ?Make sure that it is fully opened.  Do not climb a closed stepladder. ?Make sure that both sides of the stepladder are locked into place. ?Ask someone to hold it for you, if possible. ?Clearly mark and make sure that you can see: ?Any grab ba

## 2021-07-17 ENCOUNTER — Other Ambulatory Visit: Payer: Self-pay | Admitting: Family

## 2021-07-17 ENCOUNTER — Ambulatory Visit: Payer: Medicare Other

## 2021-07-17 DIAGNOSIS — J069 Acute upper respiratory infection, unspecified: Secondary | ICD-10-CM

## 2021-07-22 NOTE — Chronic Care Management (AMB) (Deleted)
?  Chronic Care Management  ? ?Note ? ?07/22/2021 ?Name: Misty Cervantes MRN: [id number] DOB: 1945-11-21 ? ?Misty Cervantes is a 76 y.o. year old female who is a primary care patient of Jeanie Sewer, NP. I reached out to Barron Alvine by phone today in response to a referral sent by [name]. ? ?[name] was given information about Chronic Care Management services today including:  ?CCM service includes personalized support from designated clinical staff supervised by her physician, including individualized plan of care and coordination with other care providers ?24/7 contact phone numbers for assistance for urgent and routine care needs. ?Service will only be billed when office clinical staff spend 20 minutes or more in a month to coordinate care. ?Only one practitioner may furnish and bill the service in a calendar month. ?The patient may stop CCM services at any time (effective at the end of the month) by phone call to the office staff. ?The patient is responsible for co-pay (up to 20% after annual deductible is met) if co-pay is required by the individual health plan.  ? ?Patient agreed to services and verbal consent obtained.  ? ?Follow up plan: ?Telephone appointment with care management team member scheduled for:07/30/2021 ? ?Noreene Larsson, RMA ?Care Guide, Embedded Care Coordination ?[facility]  ?[place], [place] [address] ?Direct Dial: [phone] ?[email or web address] ?Website: [email or web address]  ? ?

## 2021-07-27 NOTE — Chronic Care Management (AMB) (Signed)
?  Care Management  ? ?Note ? ?07/27/2021 ?Name: [name] MRN: [id number] DOB: 09-Nov-1945 ? ?Misty Cervantes is a 76 y.o. year old female who is a primary care patient of Jeanie Sewer, NP. I reached out to Barron Alvine by phone today offer care coordination services.  ? ?Ms. Schmiesing was given information about care management services today including:  ?Care management services include personalized support from designated clinical staff supervised by her physician, including individualized plan of care and coordination with other care providers ?24/7 contact phone numbers for assistance for urgent and routine care needs. ?The patient may stop care management services at any time by phone call to the office staff. ? ?Patient agreed to services and verbal consent obtained.  ? ?Follow up plan: ?Telephone appointment with care management team member scheduled for:07/30/2021 ? ?Noreene Larsson, RMA ?Care Guide, Embedded Care Coordination ?[facility]  ?[place], [place] [address] ?Direct Dial: [phone] ?[email or web address] ?Website: [email or web address]  ? ?

## 2021-07-29 ENCOUNTER — Other Ambulatory Visit: Payer: Self-pay | Admitting: Family

## 2021-07-29 DIAGNOSIS — I152 Hypertension secondary to endocrine disorders: Secondary | ICD-10-CM

## 2021-07-30 ENCOUNTER — Other Ambulatory Visit: Payer: Self-pay

## 2021-07-30 ENCOUNTER — Emergency Department (HOSPITAL_BASED_OUTPATIENT_CLINIC_OR_DEPARTMENT_OTHER)
Admission: EM | Admit: 2021-07-30 | Discharge: 2021-07-30 | Disposition: A | Discharge status: Home / Self Care | Payer: Medicare Other | Attending: Emergency Medicine | Admitting: Emergency Medicine

## 2021-07-30 ENCOUNTER — Encounter (HOSPITAL_BASED_OUTPATIENT_CLINIC_OR_DEPARTMENT_OTHER): Payer: Self-pay

## 2021-07-30 ENCOUNTER — Ambulatory Visit: Payer: Medicare Other | Admitting: Licensed Clinical Social Worker

## 2021-07-30 ENCOUNTER — Emergency Department (HOSPITAL_BASED_OUTPATIENT_CLINIC_OR_DEPARTMENT_OTHER): Payer: Medicare Other

## 2021-07-30 DIAGNOSIS — I129 Hypertensive chronic kidney disease with stage 1 through stage 4 chronic kidney disease, or unspecified chronic kidney disease: Secondary | ICD-10-CM | POA: Diagnosis not present

## 2021-07-30 DIAGNOSIS — E1122 Type 2 diabetes mellitus with diabetic chronic kidney disease: Secondary | ICD-10-CM | POA: Insufficient documentation

## 2021-07-30 DIAGNOSIS — S39011A Strain of muscle, fascia and tendon of abdomen, initial encounter: Secondary | ICD-10-CM | POA: Diagnosis not present

## 2021-07-30 DIAGNOSIS — S3991XA Unspecified injury of abdomen, initial encounter: Secondary | ICD-10-CM | POA: Diagnosis present

## 2021-07-30 DIAGNOSIS — N182 Chronic kidney disease, stage 2 (mild): Secondary | ICD-10-CM | POA: Insufficient documentation

## 2021-07-30 DIAGNOSIS — Z7982 Long term (current) use of aspirin: Secondary | ICD-10-CM | POA: Diagnosis not present

## 2021-07-30 DIAGNOSIS — T148XXA Other injury of unspecified body region, initial encounter: Secondary | ICD-10-CM

## 2021-07-30 DIAGNOSIS — Z79899 Other long term (current) drug therapy: Secondary | ICD-10-CM | POA: Insufficient documentation

## 2021-07-30 DIAGNOSIS — X500XXA Overexertion from strenuous movement or load, initial encounter: Secondary | ICD-10-CM | POA: Insufficient documentation

## 2021-07-30 LAB — COMPREHENSIVE METABOLIC PANEL
ALT: 13 U/L (ref 0–44)
AST: 13 U/L — ABNORMAL LOW (ref 15–41)
Albumin: 4.1 g/dL (ref 3.5–5.0)
Alkaline Phosphatase: 69 U/L (ref 38–126)
Anion gap: 12 (ref 5–15)
BUN: 19 mg/dL (ref 8–23)
CO2: 23 mmol/L (ref 22–32)
Calcium: 9 mg/dL (ref 8.9–10.3)
Chloride: 101 mmol/L (ref 98–111)
Creatinine, Ser: 1.07 mg/dL — ABNORMAL HIGH (ref 0.44–1.00)
GFR, Estimated: 54 mL/min — ABNORMAL LOW (ref >60)
Glucose, Bld: 277 mg/dL — ABNORMAL HIGH (ref 70–99)
Potassium: 3.7 mmol/L (ref 3.5–5.1)
Sodium: 136 mmol/L (ref 135–145)
Total Bilirubin: 0.7 mg/dL (ref 0.3–1.2)
Total Protein: 6.8 g/dL (ref 6.5–8.1)

## 2021-07-30 LAB — URINALYSIS, ROUTINE W REFLEX MICROSCOPIC
Bilirubin Urine: NEGATIVE
Glucose, UA: NEGATIVE mg/dL
Hgb urine dipstick: NEGATIVE
Ketones, ur: NEGATIVE mg/dL
Nitrite: NEGATIVE
Specific Gravity, Urine: 1.024 (ref 1.005–1.030)
pH: 5.5 (ref 5.0–8.0)

## 2021-07-30 LAB — CBC WITH DIFFERENTIAL/PLATELET
Abs Immature Granulocytes: 0.02 10*3/uL (ref 0.00–0.07)
Basophils Absolute: 0 10*3/uL (ref 0.0–0.1)
Basophils Relative: 1 %
Eosinophils Absolute: 0.1 10*3/uL (ref 0.0–0.5)
Eosinophils Relative: 2 %
HCT: 39.9 % (ref 36.0–46.0)
Hemoglobin: 13.4 g/dL (ref 12.0–15.0)
Immature Granulocytes: 0 %
Lymphocytes Relative: 22 %
Lymphs Abs: 1.2 10*3/uL (ref 0.7–4.0)
MCH: 31.4 pg (ref 26.0–34.0)
MCHC: 33.6 g/dL (ref 30.0–36.0)
MCV: 93.4 fL (ref 80.0–100.0)
Monocytes Absolute: 0.4 10*3/uL (ref 0.1–1.0)
Monocytes Relative: 8 %
Neutro Abs: 3.7 10*3/uL (ref 1.7–7.7)
Neutrophils Relative %: 67 %
Platelets: 218 10*3/uL (ref 150–400)
RBC: 4.27 MIL/uL (ref 3.87–5.11)
RDW: 12.5 % (ref 11.5–15.5)
WBC: 5.5 10*3/uL (ref 4.0–10.5)
nRBC: 0 % (ref 0.0–0.2)

## 2021-07-30 LAB — LIPASE, BLOOD: Lipase: 99 U/L — ABNORMAL HIGH (ref 11–51)

## 2021-07-30 LAB — TROPONIN I (HIGH SENSITIVITY): Troponin I (High Sensitivity): 6 ng/L (ref <18)

## 2021-07-30 MED ORDER — CYCLOBENZAPRINE HCL 5 MG PO TABS: 5.0000 mg | ORAL_TABLET | Freq: Two times a day (BID) | ORAL | 0 refills | Status: AC | PRN

## 2021-07-30 MED ORDER — ONDANSETRON HCL 4 MG/2ML IJ SOLN
4.0000 mg | Freq: Once | INTRAMUSCULAR | Status: AC
  Administered 2021-07-30: 4 mg via INTRAVENOUS
  Filled 2021-07-30: qty 2

## 2021-07-30 MED ORDER — MORPHINE SULFATE (PF) 2 MG/ML IV SOLN
2.0000 mg | Freq: Once | INTRAVENOUS | Status: AC
  Administered 2021-07-30: 2 mg via INTRAVENOUS
  Filled 2021-07-30: qty 1

## 2021-07-30 NOTE — ED Notes (Signed)
Patient transported to Ultrasound 

## 2021-07-30 NOTE — Discharge Instructions (Addendum)
Please call your pcp next week for your symptoms are not improving. Return with any worsening symptoms.  The muscle relaxant is at the pharmacy.  Please remember that this medication may make you tired.  Do not drive or take care of others while taking this medication.    Additionally, cream that we talked about is is called Voltaren.

## 2021-07-30 NOTE — ED Triage Notes (Signed)
States has pain in mid back onset Monday after reaching over to pick something up.  Radiates to right side.

## 2021-07-30 NOTE — ED Notes (Signed)
Patient aware of need for urine sample ?

## 2021-07-30 NOTE — Chronic Care Management (AMB) (Signed)
Care Management Clinical Social Work Note  07/30/2021 Name: UNIQUE SILLAS MRN: [id number] DOB: 1945/10/04  Misty Cervantes is a 76 y.o. year old female who is a primary care patient of Jeanie Sewer, NP.  The Care Management team was consulted for assistance with chronic disease management and coordination needs.  Engaged with patient by telephone for initial visit in response to provider referral for social work chronic care management and care coordination services  Consent to Services:  Ms. Throgmorton was given information about Care Management services today including:  Care Management services includes personalized support from designated clinical staff supervised by her physician, including individualized plan of care and coordination with other care providers 24/7 contact phone numbers for assistance for urgent and routine care needs. The patient may stop case management services at any time by phone call to the office staff.  Patient agreed to services and consent obtained.   Summary:  Patient unable to complete full assessment today, called back to reschedule phone appointment due to conflict with another appointment. She is also moving the first of June. Appointment scheduled June 8th .  See Care Plan below for interventions and patient self-care actives.  Recommendation: Patient may benefit from, and is in agreement to call [facility] for grief counseling and support.   Follow up Plan: Patient would like continued follow-up from CCM LCSW.  per patient's request will follow up in 3 weeks.  Will call office if needed prior to next encounter.   Assessment: Review of patient past medical history, allergies, medications, and health status, including review of relevant consultants reports was performed today as part of a comprehensive evaluation and provision of chronic care management and care coordination services.  SDOH (Social Determinants of Health) assessments and  interventions performed:  SDOH Interventions    Flowsheet Row Most Recent Value  SDOH Interventions   SDOH Interventions for the Following Domains Stress  [grief and moving to smaller housing]  Stress Interventions Other (Comment)        Advanced Directives Status: Not addressed in this encounter.  Care Plan Conditions to be addressed/monitored:  Grief ;   Care Plan : LCSW Plan of Care  Updates made by Maurine Cane, LCSW since 07/30/2021 12:00 AM     Problem: Coping Skills      Goal: Coping Skills Enhanced   Start Date: 07/30/2021  This Visit's Progress: On track  Priority: High  Note:   Current Barriers:  Disease Management support and education needs related to Grief  CSW Clinical Goal(s):  patient will work with SW to address concerns related to connecting for grief counseling  through collaboration with [facility] representative, provider, and care team.   Interventions: Inter-disciplinary care team collaboration (see longitudinal plan of care) Evaluation of current treatment plan related to  self management and patient's adherence to plan as established by provider  Mental Health:  (Status: New goal.) Evaluation of current treatment plan related to Grief Solution-Focused Strategies employed:  Participation in counseling encouraged  Participation in support group encouraged  Provided phone number to [place]  Task & activities to accomplish goals: Call [facility] to follow up on grief counseling and support groups     Casimer Lanius, LCSW Licensed Clinical Social Worker [facility]  [facility] [place] [phone]

## 2021-07-30 NOTE — Chronic Care Management (AMB) (Signed)
    Clinical Social Work  Care Management   Phone Outreach    07/30/2021 Name: LEA BAINE MRN: [id number] DOB: [birth year]  JALISHA ENNEKING is a 76 y.o. year old female who is a primary care patient of Jeanie Sewer, NP .   Reason for referral: Grief Counseling.    CCM LCSW reached out to patient today by phone to introduce self, assess needs and offer Care Management services and interventions.    Patient not feeling well and unable to keep phone appointment today and requested to reschedule.  Plan:Appointment was rescheduled with CCM LCSW on May 25th at 11:00  Review of patient status, including review of consultants reports, relevant laboratory and other test results, and collaboration with appropriate care team members and the patient's provider was performed as part of comprehensive patient evaluation and provision of care management services.    Casimer Lanius, LCSW Licensed Clinical Social Worker [facility]  [facility] [place] [phone]

## 2021-07-30 NOTE — ED Provider Notes (Signed)
[place] EMERGENCY DEPT Provider Note   CSN: [id number] Arrival date & time: 07/30/21  1340     History  Chief Complaint  Patient presents with   Back Pain    Misty Cervantes is a 76 y.o. female with a past medical history of hypertension, CKD 2 and type 2 diabetes presenting today with complaint of right flank pain.  Reports on Monday she was lifting something and then started to feel right flank/abdominal pain.  She tried to use heat packs and Tylenol but this did not change her symptoms.  Pain is worse with bending and twenty. Denies urinary symptoms but endorses nausea.  No vomiting or diarrhea.  No history of abdominal surgery.   Back Pain Associated symptoms: abdominal pain   Associated symptoms: no dysuria and no fever       Home Medications Prior to Admission medications   Medication Sig Start Date End Date Taking? Authorizing Provider  Accu-Chek FastClix Lancets MISC Check blood sugars 1-2 times per day. 01/25/19   Orma Flaming, MD  acetaminophen (TYLENOL) 325 MG tablet Take 650 mg by mouth as needed.    [provider]  albuterol (VENTOLIN HFA) 108 (90 Base) MCG/ACT inhaler TAKE 2 PUFFS BY MOUTH EVERY 6 HOURS AS NEEDED FOR WHEEZE OR SHORTNESS OF BREATH Patient not taking: Reported on 07/16/2021 12/09/19   Orma Flaming, MD  aspirin EC 81 MG tablet Take 81 mg by mouth daily. Swallow whole.    [provider]  Cyanocobalamin (B-12 PO) Take 1 tablet by mouth daily.    [provider]  fluticasone (FLONASE) 50 MCG/ACT nasal spray SPRAY 1 SPRAY INTO EACH NOSTRIL TWICE A DAY AS NEEDED FOR ALLERGIES OR RHINITIS 07/17/21   Hudnell, Colletta [place], NP  glipiZIDE (GLUCOTROL XL) 5 MG 24 hr tablet TAKE 1 TABLET BY MOUTH EVERY DAY WITH BREAKFAST 05/28/21   Jeanie Sewer, NP  glucose blood (ACCU-CHEK GUIDE) test strip TEST FOUR TIMES DAILY 11/21/20   Vivi Barrack, MD  losartan-hydrochlorothiazide (HYZAAR) 50-12.5 MG tablet TAKE 1 TABLET BY  MOUTH EVERY DAY 07/29/21   Jeanie Sewer, NP  meclizine (ANTIVERT) 25 MG tablet TAKE 1 TABLET BY MOUTH 3 TIMES A DAY AS NEEDED FOR DIZZINESS 04/03/21   Jeanie Sewer, NP  Multiple Vitamin (MULTIVITAMIN WITH MINERALS) TABS tablet Take 1 tablet by mouth daily.    [provider]  pravastatin (PRAVACHOL) 40 MG tablet TAKE 1 TABLET BY MOUTH EVERY DAY 07/16/21   Jeanie Sewer, NP  traZODone (DESYREL) 50 MG tablet TAKE 1/2 TO 1 TABLET BY MOUTH AT BEDTIME AS NEEDED FOR SLEEP Patient taking differently: Take 50 mg by mouth at bedtime as needed. TAKE 1/2 TO 1 TABLET BY MOUTH AT BEDTIME AS NEEDED FOR SLEEP 07/07/20   Orma Flaming, MD      Allergies    Norvasc [amlodipine]    Review of Systems   Review of Systems  Constitutional:  Negative for chills and fever.  Gastrointestinal:  Positive for abdominal pain and nausea. Negative for diarrhea and vomiting.  Genitourinary:  Negative for dysuria.  Musculoskeletal:  Positive for back pain.   Physical Exam Updated Vital Signs BP 131/70 (BP Location: Right Arm)   Pulse 85   Temp 97.6 F (36.4 C) (Oral)   Resp 18   Ht '4\' 11"'$  (1.499 m)   Wt 68.5 kg   LMP  (LMP Unknown)   SpO2 98%   BMI 30.50 kg/m  Physical Exam Vitals and nursing note reviewed.  Constitutional:  General: She is not in acute distress.    Appearance: Normal appearance. She is not ill-appearing.  HENT:     Head: Normocephalic and atraumatic.  Eyes:     General: No scleral icterus.    Conjunctiva/sclera: Conjunctivae normal.  Cardiovascular:     Rate and Rhythm: Normal rate and regular rhythm.  Pulmonary:     Effort: Pulmonary effort is normal. No respiratory distress.     Breath sounds: No wheezing.  Abdominal:     General: Abdomen is flat.     Palpations: Abdomen is soft.     Tenderness: There is abdominal tenderness (RUQ). There is no right CVA tenderness or left CVA tenderness.  Skin:    General: Skin is warm and dry.     Findings: No rash.   Neurological:     Mental Status: She is alert.  Psychiatric:        Mood and Affect: Mood normal.    ED Results / Procedures / Treatments   Labs (all labs ordered are listed, but only abnormal results are displayed) Labs Reviewed - No data to display  EKG None  Radiology US Abdomen Limited RUQ (LIVER/GB)  Result Date: 07/30/2021 CLINICAL DATA:  Back pain which radiates to RIGHT side. EXAM: ULTRASOUND ABDOMEN LIMITED RIGHT UPPER QUADRANT COMPARISON:  None Available. FINDINGS: Gallbladder: No gallstones or wall thickening visualized. No sonographic Murphy sign noted by sonographer. Common bile duct: Diameter: Normal at 3 mm Liver: No focal lesion identified. Uniform increase in hepatic echogenicity. Portal vein is patent on color Doppler imaging with normal direction of blood flow towards the liver. Other: None. IMPRESSION: 1. Normal gallbladder and biliary tree. 2. Mild increase in hepatic echogenicity may represent hepatic steatosis. Electronically Signed   By: Suzy Bouchard M.D.   On: 07/30/2021 15:05    Procedures Procedures    Medications Ordered in ED Medications  morphine (PF) 2 MG/ML injection 2 mg (2 mg Intravenous Given 07/30/21 1429)  ondansetron (ZOFRAN) injection 4 mg (4 mg Intravenous Given 07/30/21 1428)    ED Course/ Medical Decision Making/ A&P                           Medical Decision Making Amount and/or Complexity of Data Reviewed Labs: ordered. Radiology: ordered.  Risk Prescription drug management.   This patient presents to the ED for concern of flank pain. The differential diagnosis of emergent flank pain includes, but is not limited to : Abdominal aortic aneurysm,, Renal artery embolism,Renal vein thrombosis, Aortic dissection, Mesenteric ischemia, Pyelonephritis, Renal infarction, Renal hemorrhage, Nephrolithiasis/ Renal Colic,Cystitis, Biliary colic, Pancreatitis, peptic ulcer, Appendicitis , Diverticulitis, Bowel obstruction, Shingles     This  is not an exhaustive differential.    Past Medical History / Co-morbidities / Social History: T2DM, HTN   Physical Exam: Physical exam performed. The pertinent findings include:  -Severe right upper quadrant tenderness -Right thoracic spine paraspinal tenderness  Lab Tests: I ordered, and personally interpreted labs.  The pertinent results include:  -isolated lipase 99   Imaging Studies: I ordered imaging studies including RUQ ultrasound. I independently visualized and interpreted imaging which showed no cholecystitis/cholelithiasis. I agree with the radiologist interpretation.    Medications: I ordered medication including morphine and Zofran. Reevaluation of the patient after these medicines showed that the patient improved. I have reviewed the patients home medicines and have made adjustments as needed.    Disposition: After consideration of the diagnostic results and the patients response  to treatment, I feel that life-threatening concerns have been successfully ruled out.  EKG and troponin stable.  Low concern for ACS.  Heart score 2.  Low likelihood Wells PE score.  Negative right upper quadrant ultrasound, no leukocytosis.  Low suspicion pancreatitis despite elevated lipase.  Low concern for nephrolithiasis, normal UA.  Patient's symptoms are reproducible on physical exam as well as worse with certain motions.  Likely MSK.  Will Flexeril and encourage PCP follow-up.  She and her granddaughter are agreeable.  I discussed this case with my attending physician Dr. Maryan Rued who saw the patient, cosigned this note including patient's presenting symptoms, physical exam, and planned diagnostics and interventions. Attending physician stated agreement with plan or made changes to plan which were implemented.     Final Clinical Impression(s) / ED Diagnoses Final diagnoses:  Muscle strain    Rx / DC Orders ED Discharge Orders          Ordered    cyclobenzaprine (FLEXERIL) 5 MG tablet   2 times daily PRN        07/30/21 1539           Results and diagnoses were explained to the patient. Return precautions discussed in full. Patient had no additional questions and expressed complete understanding.   This chart was dictated using voice recognition software.  Despite best efforts to proofread,  errors can occur which can change the documentation meaning.    Darliss Ridgel 07/30/21 1555    Blanchie Dessert, MD 08/01/21 1354

## 2021-07-30 NOTE — Patient Instructions (Signed)
Visit Information  Thank you for taking time to visit with me today. Please don't hesitate to contact me if I can be of assistance to you before our next scheduled telephone appointment.  Following are the goals we discussed today:  Connecting you for  grief support  Please call the care guide team at [phone] if you need to cancel or reschedule your appointment.   If you are experiencing a Mental Health or Liberal or need someone to talk to, please call 1-800-273-TALK (toll free, 24 hour hotline)   Following is a copy of your full plan of care:  Care Plan : LCSW Plan of Care  Updates made by Maurine Cane, LCSW since 07/30/2021 12:00 AM     Problem: Coping Skills      Goal: Coping Skills Enhanced   Start Date: 07/30/2021  This Visit's Progress: On track  Priority: High  Note:   Current Barriers:  Disease Management support and education needs related to Grief  CSW Clinical Goal(s):  patient will work with SW to address concerns related to connecting for grief counseling  through collaboration with [facility] representative, provider, and care team.   Interventions: Inter-disciplinary care team collaboration (see longitudinal plan of care) Evaluation of current treatment plan related to  self management and patient's adherence to plan as established by provider  Mental Health:  (Status: New goal.) Evaluation of current treatment plan related to Grief Solution-Focused Strategies employed:  Participation in counseling encouraged  Participation in support group encouraged  Provided phone number to [facility]  Task & activities to accomplish goals: Call [place] to follow up on grief counseling and support groups     Ms. Reist was given information about Care Management services by the embedded care coordination team including:  Care Management services include personalized support from designated clinical staff supervised by her physician,  including individualized plan of care and coordination with other care providers 24/7 contact phone numbers for assistance for urgent and routine care needs. The patient may stop CCM services at any time (effective at the end of the month) by phone call to the office staff.  Patient agreed to services and verbal consent obtained.   per your request your appointment is scheduled June 8th at 11:00  Casimer Lanius, [place] Licensed Clinical Social Worker [facility]  [facility] [place] [phone]

## 2021-08-06 ENCOUNTER — Telehealth: Payer: Medicare Other

## 2021-08-08 ENCOUNTER — Other Ambulatory Visit: Payer: Self-pay | Admitting: Family

## 2021-08-08 DIAGNOSIS — R42 Dizziness and giddiness: Secondary | ICD-10-CM

## 2021-08-20 ENCOUNTER — Telehealth: Payer: Medicare Other

## 2021-08-22 ENCOUNTER — Encounter (HOSPITAL_BASED_OUTPATIENT_CLINIC_OR_DEPARTMENT_OTHER): Payer: Self-pay | Admitting: Emergency Medicine

## 2021-08-22 ENCOUNTER — Emergency Department (HOSPITAL_BASED_OUTPATIENT_CLINIC_OR_DEPARTMENT_OTHER): Payer: Medicare Other

## 2021-08-22 ENCOUNTER — Other Ambulatory Visit: Payer: Self-pay

## 2021-08-22 ENCOUNTER — Inpatient Hospital Stay (HOSPITAL_BASED_OUTPATIENT_CLINIC_OR_DEPARTMENT_OTHER)
Admission: EM | Admit: 2021-08-22 | Discharge: 2021-08-24 | DRG: 193 | Disposition: A | Discharge status: Home / Self Care | Payer: Medicare Other | Attending: Family Medicine | Admitting: Family Medicine

## 2021-08-22 DIAGNOSIS — E785 Hyperlipidemia, unspecified: Secondary | ICD-10-CM | POA: Diagnosis present

## 2021-08-22 DIAGNOSIS — J189 Pneumonia, unspecified organism: Secondary | ICD-10-CM | POA: Diagnosis present

## 2021-08-22 DIAGNOSIS — Z96651 Presence of right artificial knee joint: Secondary | ICD-10-CM | POA: Diagnosis present

## 2021-08-22 DIAGNOSIS — Z90710 Acquired absence of both cervix and uterus: Secondary | ICD-10-CM

## 2021-08-22 DIAGNOSIS — R1084 Generalized abdominal pain: Secondary | ICD-10-CM | POA: Diagnosis present

## 2021-08-22 DIAGNOSIS — N1831 Chronic kidney disease, stage 3a: Secondary | ICD-10-CM | POA: Diagnosis present

## 2021-08-22 DIAGNOSIS — E1122 Type 2 diabetes mellitus with diabetic chronic kidney disease: Secondary | ICD-10-CM | POA: Diagnosis present

## 2021-08-22 DIAGNOSIS — I129 Hypertensive chronic kidney disease with stage 1 through stage 4 chronic kidney disease, or unspecified chronic kidney disease: Secondary | ICD-10-CM | POA: Diagnosis present

## 2021-08-22 DIAGNOSIS — J9601 Acute respiratory failure with hypoxia: Secondary | ICD-10-CM | POA: Diagnosis present

## 2021-08-22 DIAGNOSIS — Z7984 Long term (current) use of oral hypoglycemic drugs: Secondary | ICD-10-CM | POA: Diagnosis not present

## 2021-08-22 DIAGNOSIS — N10 Acute tubulo-interstitial nephritis: Secondary | ICD-10-CM | POA: Diagnosis present

## 2021-08-22 DIAGNOSIS — Z20822 Contact with and (suspected) exposure to covid-19: Secondary | ICD-10-CM | POA: Diagnosis present

## 2021-08-22 DIAGNOSIS — E1165 Type 2 diabetes mellitus with hyperglycemia: Secondary | ICD-10-CM | POA: Diagnosis present

## 2021-08-22 DIAGNOSIS — Z96642 Presence of left artificial hip joint: Secondary | ICD-10-CM | POA: Diagnosis present

## 2021-08-22 DIAGNOSIS — Z79899 Other long term (current) drug therapy: Secondary | ICD-10-CM

## 2021-08-22 DIAGNOSIS — Z8711 Personal history of peptic ulcer disease: Secondary | ICD-10-CM

## 2021-08-22 DIAGNOSIS — R700 Elevated erythrocyte sedimentation rate: Secondary | ICD-10-CM

## 2021-08-22 DIAGNOSIS — R109 Unspecified abdominal pain: Secondary | ICD-10-CM

## 2021-08-22 DIAGNOSIS — E871 Hypo-osmolality and hyponatremia: Secondary | ICD-10-CM | POA: Diagnosis present

## 2021-08-22 DIAGNOSIS — M1990 Unspecified osteoarthritis, unspecified site: Secondary | ICD-10-CM | POA: Diagnosis present

## 2021-08-22 DIAGNOSIS — R519 Headache, unspecified: Secondary | ICD-10-CM

## 2021-08-22 DIAGNOSIS — Z888 Allergy status to other drugs, medicaments and biological substances status: Secondary | ICD-10-CM | POA: Diagnosis not present

## 2021-08-22 DIAGNOSIS — Z972 Presence of dental prosthetic device (complete) (partial): Secondary | ICD-10-CM

## 2021-08-22 DIAGNOSIS — Z8616 Personal history of COVID-19: Secondary | ICD-10-CM

## 2021-08-22 DIAGNOSIS — Z833 Family history of diabetes mellitus: Secondary | ICD-10-CM

## 2021-08-22 DIAGNOSIS — Z7982 Long term (current) use of aspirin: Secondary | ICD-10-CM

## 2021-08-22 DIAGNOSIS — B9620 Unspecified Escherichia coli [E. coli] as the cause of diseases classified elsewhere: Secondary | ICD-10-CM | POA: Diagnosis present

## 2021-08-22 DIAGNOSIS — R197 Diarrhea, unspecified: Secondary | ICD-10-CM | POA: Diagnosis present

## 2021-08-22 DIAGNOSIS — N189 Chronic kidney disease, unspecified: Secondary | ICD-10-CM | POA: Diagnosis present

## 2021-08-22 DIAGNOSIS — N390 Urinary tract infection, site not specified: Secondary | ICD-10-CM

## 2021-08-22 DIAGNOSIS — Z8249 Family history of ischemic heart disease and other diseases of the circulatory system: Secondary | ICD-10-CM

## 2021-08-22 HISTORY — DX: Pneumonia, unspecified organism: J18.9

## 2021-08-22 LAB — COMPREHENSIVE METABOLIC PANEL
ALT: 20 U/L (ref 0–44)
AST: 18 U/L (ref 15–41)
Albumin: 4.2 g/dL (ref 3.5–5.0)
Alkaline Phosphatase: 102 U/L (ref 38–126)
Anion gap: 14 (ref 5–15)
BUN: 20 mg/dL (ref 8–23)
CO2: 24 mmol/L (ref 22–32)
Calcium: 9.8 mg/dL (ref 8.9–10.3)
Chloride: 92 mmol/L — ABNORMAL LOW (ref 98–111)
Creatinine, Ser: 1.13 mg/dL — ABNORMAL HIGH (ref 0.44–1.00)
GFR, Estimated: 50 mL/min — ABNORMAL LOW (ref >60)
Glucose, Bld: 217 mg/dL — ABNORMAL HIGH (ref 70–99)
Potassium: 3.7 mmol/L (ref 3.5–5.1)
Sodium: 130 mmol/L — ABNORMAL LOW (ref 135–145)
Total Bilirubin: 1.7 mg/dL — ABNORMAL HIGH (ref 0.3–1.2)
Total Protein: 8 g/dL (ref 6.5–8.1)

## 2021-08-22 LAB — CBC WITH DIFFERENTIAL/PLATELET
Abs Immature Granulocytes: 0.15 10*3/uL — ABNORMAL HIGH (ref 0.00–0.07)
Basophils Absolute: 0.4 10*3/uL — ABNORMAL HIGH (ref 0.0–0.1)
Basophils Relative: 4 %
Eosinophils Absolute: 0 10*3/uL (ref 0.0–0.5)
Eosinophils Relative: 0 %
HCT: 41 % (ref 36.0–46.0)
Hemoglobin: 13.7 g/dL (ref 12.0–15.0)
Immature Granulocytes: 2 %
Lymphocytes Relative: 9 %
Lymphs Abs: 0.8 10*3/uL (ref 0.7–4.0)
MCH: 31.1 pg (ref 26.0–34.0)
MCHC: 33.4 g/dL (ref 30.0–36.0)
MCV: 93 fL (ref 80.0–100.0)
Monocytes Absolute: 1.1 10*3/uL — ABNORMAL HIGH (ref 0.1–1.0)
Monocytes Relative: 13 %
Neutro Abs: 6.5 10*3/uL (ref 1.7–7.7)
Neutrophils Relative %: 72 %
Platelets: 251 10*3/uL (ref 150–400)
RBC: 4.41 MIL/uL (ref 3.87–5.11)
RDW: 12.7 % (ref 11.5–15.5)
WBC: 9 10*3/uL (ref 4.0–10.5)
nRBC: 0 % (ref 0.0–0.2)

## 2021-08-22 LAB — LIPASE, BLOOD: Lipase: 45 U/L (ref 11–51)

## 2021-08-22 LAB — URINALYSIS, ROUTINE W REFLEX MICROSCOPIC
Bilirubin Urine: NEGATIVE
Glucose, UA: NEGATIVE mg/dL
Ketones, ur: 15 mg/dL — AB
Nitrite: NEGATIVE
Protein, ur: 100 mg/dL — AB
Specific Gravity, Urine: 1.018 (ref 1.005–1.030)
WBC, UA: >50 WBC/hpf — ABNORMAL HIGH (ref 0–5)
pH: 5.5 (ref 5.0–8.0)

## 2021-08-22 LAB — C-REACTIVE PROTEIN: CRP: 28.8 mg/dL — ABNORMAL HIGH (ref <1.0)

## 2021-08-22 LAB — SARS CORONAVIRUS 2 BY RT PCR: SARS Coronavirus 2 by RT PCR: NEGATIVE

## 2021-08-22 LAB — TROPONIN I (HIGH SENSITIVITY)
Troponin I (High Sensitivity): 9 ng/L (ref <18)
Troponin I (High Sensitivity): 10 ng/L (ref <18)

## 2021-08-22 LAB — LACTIC ACID, PLASMA: Lactic Acid, Venous: 1.4 mmol/L (ref 0.5–1.9)

## 2021-08-22 LAB — SEDIMENTATION RATE: Sed Rate: 83 mm/hr — ABNORMAL HIGH (ref 0–22)

## 2021-08-22 LAB — TSH: TSH: 0.999 u[IU]/mL (ref 0.350–4.500)

## 2021-08-22 LAB — CBG MONITORING, ED: Glucose-Capillary: 188 mg/dL — ABNORMAL HIGH (ref 70–99)

## 2021-08-22 LAB — BRAIN NATRIURETIC PEPTIDE: B Natriuretic Peptide: 46.5 pg/mL (ref 0.0–100.0)

## 2021-08-22 MED ORDER — SODIUM CHLORIDE 0.9 % IV BOLUS
1000.0000 mL | Freq: Once | INTRAVENOUS | Status: AC
  Administered 2021-08-22: 1000 mL via INTRAVENOUS

## 2021-08-22 MED ORDER — METOCLOPRAMIDE HCL 5 MG/ML IJ SOLN
10.0000 mg | Freq: Once | INTRAMUSCULAR | Status: AC
  Administered 2021-08-22: 10 mg via INTRAVENOUS
  Filled 2021-08-22: qty 2

## 2021-08-22 MED ORDER — AZITHROMYCIN 500 MG IV SOLR
500.0000 mg | Freq: Once | INTRAVENOUS | Status: AC
  Administered 2021-08-22: 500 mg via INTRAVENOUS
  Filled 2021-08-22: qty 5

## 2021-08-22 MED ORDER — ONDANSETRON HCL 4 MG/2ML IJ SOLN
4.0000 mg | Freq: Once | INTRAMUSCULAR | Status: AC
  Administered 2021-08-22: 4 mg via INTRAVENOUS
  Filled 2021-08-22: qty 2

## 2021-08-22 MED ORDER — CEFTRIAXONE SODIUM 1 G IJ SOLR
1.0000 g | Freq: Once | INTRAMUSCULAR | Status: AC
  Administered 2021-08-22: 1 g via INTRAVENOUS
  Filled 2021-08-22: qty 10

## 2021-08-22 MED ORDER — KETOROLAC TROMETHAMINE 30 MG/ML IJ SOLN
15.0000 mg | Freq: Once | INTRAMUSCULAR | Status: AC
  Administered 2021-08-22: 15 mg via INTRAVENOUS
  Filled 2021-08-22: qty 1

## 2021-08-22 NOTE — ED Provider Notes (Signed)
[facility] EMERG[facility] DEPT Provider Note   CSN: [id number] Arrival date & time: 08/22/21  1504     History  Chief Complaint  Patient presents with   Headache   Fatigue    Misty Cervantes is a 76 y.o. female.  She has a history ofDiabetes CKD hyperlipidemia.  Complaining of 3 days of malaise fatigue anorexia poor p.o. intake, cough nonproductive, tightness in chest, generalized abdominal pain, right-sided headache.  No known fevers no blurry vision double vision numbness weakness no nausea or vomiting.  Has had a few episodes of diarrhea.  No urinary symptoms.  Granddaughter with her states she has had very poor intake over the last few days.  The history is provided by the patient.  Headache Pain location:  R temporal Quality:  Dull Radiates to:  Does not radiate Severity currently:  8/10 Severity at highest:  8/10 Onset quality:  Gradual Duration:  4 days Timing:  Intermittent Progression:  Unchanged Chronicity:  New Relieved by:  Nothing Worsened by:  Nothing Ineffective treatments:  Acetaminophen Associated symptoms: abdominal pain, cough, diarrhea and fatigue   Associated symptoms: no eye pain, no fever, no focal weakness, no loss of balance, no myalgias, no nausea, no numbness, no photophobia, no sore throat, no visual change, no vomiting and no weakness   Abdominal pain:    Location:  Generalized   Quality: aching     Severity:  Mild   Timing:  Intermittent   Progression:  Unchanged Cough:    Cough characteristics:  Non-productive   Severity:  Moderate   Onset quality:  Gradual   Duration:  4 days   Timing:  Intermittent   Progression:  Unchanged   Chronicity:  New      Home Medications Prior to Admission medications   Medication Sig Start Date End Date Taking? Authorizing Provider  Accu-Chek FastClix Lancets MISC Check blood sugars 1-2 times per day. 01/25/19   Orma Flaming, MD  acetaminophen (TYLENOL) 325 MG tablet Take 650 mg by mouth  as needed.    [provider]  albuterol (VENTOLIN HFA) 108 (90 Base) MCG/ACT inhaler TAKE 2 PUFFS BY MOUTH EVERY 6 HOURS AS NEEDED FOR WHEEZE OR SHORTNESS OF BREATH Patient not taking: Reported on 07/16/2021 12/09/19   Orma Flaming, MD  aspirin EC 81 MG tablet Take 81 mg by mouth daily. Swallow whole.    [provider]  Cyanocobalamin (B-12 PO) Take 1 tablet by mouth daily.    [provider]  fluticasone (FLONASE) 50 MCG/ACT nasal spray SPRAY 1 SPRAY INTO EACH NOSTRIL TWICE A DAY AS NEEDED FOR ALLERGIES OR RHINITIS 07/17/21   Hudnell, Colletta [place], NP  glipiZIDE (GLUCOTROL XL) 5 MG 24 hr tablet TAKE 1 TABLET BY MOUTH EVERY DAY WITH BREAKFAST 05/28/21   Jeanie Sewer, NP  glucose blood (ACCU-CHEK GUIDE) test strip TEST FOUR TIMES DAILY 11/21/20   Vivi Barrack, MD  losartan-hydrochlorothiazide (HYZAAR) 50-12.5 MG tablet TAKE 1 TABLET BY MOUTH EVERY DAY 07/29/21   Jeanie Sewer, NP  meclizine (ANTIVERT) 25 MG tablet TAKE 1 TABLET BY MOUTH 3 TIMES A DAY AS NEEDED FOR DIZZINESS 04/03/21   Jeanie Sewer, NP  Multiple Vitamin (MULTIVITAMIN WITH MINERALS) TABS tablet Take 1 tablet by mouth daily.    [provider]  pravastatin (PRAVACHOL) 40 MG tablet TAKE 1 TABLET BY MOUTH EVERY DAY 07/16/21   Jeanie Sewer, NP  traZODone (DESYREL) 50 MG tablet TAKE 1/2 TO 1 TABLET BY MOUTH AT BEDTIME AS NEEDED FOR SLEEP  Patient taking differently: Take 50 mg by mouth at bedtime as needed. TAKE 1/2 TO 1 TABLET BY MOUTH AT BEDTIME AS NEEDED FOR SLEEP 07/07/20   Orma Flaming, MD      Allergies    Norvasc [amlodipine]    Review of Systems   Review of Systems  Constitutional:  Positive for fatigue. Negative for fever.  HENT:  Negative for sore throat.   Eyes:  Negative for photophobia and pain.  Respiratory:  Positive for cough.   Cardiovascular:  Positive for chest pain.  Gastrointestinal:  Positive for abdominal pain and diarrhea. Negative for nausea and vomiting.   Genitourinary:  Negative for dysuria.  Musculoskeletal:  Negative for myalgias.  Skin:  Negative for rash.  Neurological:  Positive for headaches. Negative for focal weakness, weakness, numbness and loss of balance.    Physical Exam Updated Vital Signs BP 135/69   Pulse (!) 104   Temp 99.2 F (37.3 C)   Resp 20   Ht '4\' 11"'$  (1.499 m)   Wt 65.8 kg   LMP  (LMP Unknown)   SpO2 98%   BMI 29.29 kg/m  Physical Exam Vitals and nursing note reviewed.  Constitutional:      General: She is not in acute distress.    Appearance: She is well-developed.  HENT:     Head: Normocephalic and atraumatic.     Comments: No temporal tenderness Eyes:     Extraocular Movements: Extraocular movements intact.     Conjunctiva/sclera: Conjunctivae normal.     Pupils: Pupils are equal, round, and reactive to light.  Cardiovascular:     Rate and Rhythm: Normal rate and regular rhythm.     Heart sounds: No murmur heard. Pulmonary:     Effort: Pulmonary effort is normal. No respiratory distress.     Breath sounds: Normal breath sounds.  Abdominal:     General: There is no distension.     Palpations: Abdomen is soft. There is no mass.     Tenderness: There is no abdominal tenderness. There is no guarding.  Musculoskeletal:        General: No swelling.     Cervical back: Neck supple.  Skin:    General: Skin is warm and dry.     Capillary Refill: Capillary refill takes less than 2 seconds.  Neurological:     Mental Status: She is alert.     Cranial Nerves: No cranial nerve deficit.     Sensory: No sensory deficit.     Motor: No weakness.     ED Results / Procedures / Treatments   Labs (all labs ordered are listed, but only abnormal results are displayed) Labs Reviewed  COMPREHENSIVE METABOLIC PANEL - Abnormal; Notable for the following components:      Result Value   Sodium 130 (*)    Chloride 92 (*)    Glucose, Bld 217 (*)    Creatinine, Ser 1.13 (*)    Total Bilirubin 1.7 (*)     GFR, Estimated 50 (*)    All other components within normal limits  CBC WITH DIFFERENTIAL/PLATELET - Abnormal; Notable for the following components:   Monocytes Absolute 1.1 (*)    Basophils Absolute 0.4 (*)    Abs Immature Granulocytes 0.15 (*)    All other components within normal limits  URINALYSIS, ROUTINE W REFLEX MICROSCOPIC - Abnormal; Notable for the following components:   APPearance HAZY (*)    Hgb urine dipstick SMALL (*)    Ketones, ur 15 (*)  Protein, ur 100 (*)    Leukocytes,Ua LARGE (*)    WBC, UA >50 (*)    Bacteria, UA MANY (*)    All other components within normal limits  SEDIMENTATION RATE - Abnormal; Notable for the following components:   Sed Rate 83 (*)    All other components within normal limits  C-REACTIVE PROTEIN - Abnormal; Notable for the following components:   CRP 28.8 (*)    All other components within normal limits  COMPREHENSIVE METABOLIC PANEL - Abnormal; Notable for the following components:   Sodium 133 (*)    Glucose, Bld 154 (*)    Creatinine, Ser 1.44 (*)    Calcium 8.0 (*)    Total Protein 6.1 (*)    Albumin 2.8 (*)    GFR, Estimated 38 (*)    All other components within normal limits  HEMOGLOBIN A1C - Abnormal; Notable for the following components:   Hgb A1c MFr Bld 9.2 (*)    All other components within normal limits  GLUCOSE, CAPILLARY - Abnormal; Notable for the following components:   Glucose-Capillary 141 (*)    All other components within normal limits  CBG MONITORING, ED - Abnormal; Notable for the following components:   Glucose-Capillary 188 (*)    All other components within normal limits  SARS CORONAVIRUS 2 BY RT PCR  MRSA NEXT GEN BY PCR, NASAL  CULTURE, BLOOD (ROUTINE X 2)  CULTURE, BLOOD (ROUTINE X 2)  URINE CULTURE  CULTURE, BLOOD (ROUTINE X 2)  CULTURE, BLOOD (ROUTINE X 2)  LIPASE, BLOOD  BRAIN NATRIURETIC PEPTIDE  LACTIC ACID, PLASMA  TSH  STREP PNEUMONIAE URINARY ANTIGEN  LEGIONELLA PNEUMOPHILA SEROGP 1  UR AG  TROPONIN I (HIGH SENSITIVITY)  TROPONIN I (HIGH SENSITIVITY)    EKG EKG Interpretation  Date/Time:  Saturday August 22 2021 15:20:37 EDT Ventricular Rate:  102 PR Interval:  204 QRS Duration: 97 QT Interval:  366 QTC Calculation: 477 R Axis:   130 Text Interpretation: Sinus tachycardia Right ventricular hypertrophy No significant change since prior 5/23 Confirmed by Aletta Edouard [phone]) on 08/22/2021 3:29:51 PM  Radiology CT CHEST ABDOMEN PELVIS W CONTRAST  Result Date: 08/23/2021 CLINICAL DATA:  Abdominal pain. Hypertension, hyperlipidemia and diabetes. EXAM: CT CHEST, ABDOMEN, AND PELVIS WITH CONTRAST TECHNIQUE: Multidetector CT imaging of the chest, abdomen and pelvis was performed following the standard protocol during bolus administration of intravenous contrast. RADIATION DOSE REDUCTION: This exam was performed according to the departmental dose-optimization program which includes automated exposure control, adjustment of the mA and/or kV according to patient size and/or use of iterative reconstruction technique. CONTRAST:  19m OMNIPAQUE IOHEXOL 300 MG/ML  SOLN COMPARISON:  CT chest, abdomen and pelvis from 06/20/2019 FINDINGS: CT CHEST FINDINGS Cardiovascular: Heart size appears normal. Aortic atherosclerosis and coronary artery calcifications. No pericardial effusion identified. Mediastinum/Nodes: Thyroid gland, trachea and esophagus are unremarkable. No enlarged axillary, supraclavicular, mediastinal, or hilar lymph nodes. Lungs/Pleura: No pleural effusion. Mild cylindrical bronchiectasis and bronchiolectasis identified within the right lower lobe. Bilateral lower lobe airspace consolidation is identified, left greater than right. Imaging findings compatible with pneumonia and or aspiration. Mild subpleural consolidation within the posterolateral right upper lobe is also noted. No signs of interstitial edema or pneumothorax. Musculoskeletal: Age-indeterminate fracture involves  the lateral aspect of the right tenth rib. The remaining osseous structures appear intact. CT ABDOMEN PELVIS FINDINGS Hepatobiliary: No suspicious liver abnormality. Gallbladder appears normal. No bile duct dilatation. Pancreas: Unremarkable. No pancreatic ductal dilatation or surrounding inflammatory changes. Spleen: Normal in  size without focal abnormality. Adrenals/Urinary Tract: Normal adrenal glands. Left-sided pelvocaliectasis with mild haziness within the left renal sinus fat. No obstructing stone identified lung the course of either ureter. No kidney mass noted. Bladder appears within normal limits. Stomach/Bowel: Tiny hiatal hernia. The appendix is visualized and appears normal no signs of bowel wall thickening, inflammation or distension. Vascular/Lymphatic: Aortic atherosclerosis. No aneurysm. No signs of abdominopelvic adenopathy. Reproductive: Uterus appears surgically absent. No adnexal mass identified. Other: No free fluid or fluid collections identified. Small fat containing umbilical hernia. No signs of pneumoperitoneum. Musculoskeletal: Previous left hip arthroplasty. First degree anterolisthesis and degenerative disc disease noted at L4-5. No acute or suspicious osseous findings. Mild curvature of the thoracolumbar spine is convex towards the left. IMPRESSION: 1. Bilateral lower lobe airspace consolidation, left greater than right. Imaging findings compatible with pneumonia and/or aspiration. 2. Age-indeterminate fracture involves the lateral aspect of the right tenth rib. 3. Left-sided pelvocaliectasis with mild haziness within the left renal sinus fat. No obstructing stone identified. Correlate for any clinical signs or symptoms of urinary tract infection. 4. Aortic Atherosclerosis (ICD10-I70.0). 5. Coronary artery calcifications. Electronically Signed   By: Kerby Moors M.D.   On: 08/23/2021 05:56   MR BRAIN WO CONTRAST  Result Date: 08/23/2021 CLINICAL DATA:  Headache,  cluster/trigeminal. EXAM: MRI HEAD WITHOUT CONTRAST MRV HEAD WITHOUT CONTRAST TECHNIQUE: Multiplanar, multi-echo pulse sequences of the brain and surrounding structures were acquired without intravenous contrast. Angiographic images of the intracranial veins were acquired using MRV technique without intravenous contrast. COMPARISON:  03/01/2019 head CT FINDINGS: MRI HEAD FINDINGS Brain: No acute infarction, hemorrhage, hydrocephalus, extra-axial collection or mass lesion. Mild chronic small vessel ischemia in the cerebral white matter and pons. Brain volume is normal. Vascular: Arterial findings described below.  Preserved flow voids. Skull and upper cervical spine: No focal marrow lesion. Sinuses/Orbits: Clear sinuses. Bilateral cataract resection and right scleral buckle. MRV HEAD FINDINGS Dominant right transverse sigmoid drainage. No dural sinus thrombosis. Occasional filling defect from arachnoid granulation especially at the transverse sigmoid junctions. No evidence of deep vein or cortical vein thrombosis. IMPRESSION: Brain MRI: 1. No acute finding or specific cause for headache. 2. Mild chronic small vessel ischemia. Intracranial MRV: Negative for dural sinus thrombosis. Electronically Signed   By: Jorje Guild M.D.   On: 08/23/2021 05:10   MR MRV HEAD WO CM  Result Date: 08/23/2021 CLINICAL DATA:  Headache, cluster/trigeminal. EXAM: MRI HEAD WITHOUT CONTRAST MRV HEAD WITHOUT CONTRAST TECHNIQUE: Multiplanar, multi-echo pulse sequences of the brain and surrounding structures were acquired without intravenous contrast. Angiographic images of the intracranial veins were acquired using MRV technique without intravenous contrast. COMPARISON:  03/01/2019 head CT FINDINGS: MRI HEAD FINDINGS Brain: No acute infarction, hemorrhage, hydrocephalus, extra-axial collection or mass lesion. Mild chronic small vessel ischemia in the cerebral white matter and pons. Brain volume is normal. Vascular: Arterial findings  described below.  Preserved flow voids. Skull and upper cervical spine: No focal marrow lesion. Sinuses/Orbits: Clear sinuses. Bilateral cataract resection and right scleral buckle. MRV HEAD FINDINGS Dominant right transverse sigmoid drainage. No dural sinus thrombosis. Occasional filling defect from arachnoid granulation especially at the transverse sigmoid junctions. No evidence of deep vein or cortical vein thrombosis. IMPRESSION: Brain MRI: 1. No acute finding or specific cause for headache. 2. Mild chronic small vessel ischemia. Intracranial MRV: Negative for dural sinus thrombosis. Electronically Signed   By: Jorje Guild M.D.   On: 08/23/2021 05:10   DG Chest Port 1 View  Result Date: 08/22/2021  CLINICAL DATA:  Headache and difficulty breathing x4 days. EXAM: PORTABLE CHEST 1 VIEW COMPARISON:  November 26, 2019 FINDINGS: The heart size and mediastinal contours are within normal limits. There is moderate severity calcification of the thoracic aorta. Mild atelectasis and/or infiltrate is seen within the left lung base. There is no evidence of a pleural effusion or pneumothorax. Multilevel degenerative changes seen throughout the thoracic spine. IMPRESSION: Mild left basilar atelectasis and/or infiltrate. Electronically Signed   By: Virgina Norfolk M.D.   On: 08/22/2021 15:52    Procedures Procedures    Medications Ordered in ED Medications  sodium chloride 0.9 % bolus 1,000 mL (has no administration in time range)  ondansetron (ZOFRAN) injection 4 mg (has no administration in time range)    ED Course/ Medical Decision Making/ A&P Clinical Course as of 08/23/21 0916  Sat Aug 22, 2021  1556 Left base atelectasis versus infiltrate.  Awaiting radiology reading. [MB]  1801 Discussed with Dr. Nevada Crane [facility] hospital[facility].  She will put the bed in for patient at [place] in case she ends up needing neurology input.  She understands that our CT is down right now and have not proceeded with a CT  head chest abdomen or pelvis.  She is comfortable with admitting the patient on her service. [MB]  0272 Patient states her headache is improved.  Reviewed results with her and she is comfortable plan for admission due to her hypoxia. [MB]    Clinical Course User Index [MB] Hayden Rasmussen, MD                           Medical Decision Making Amount and/or Complexity of Data Reviewed Labs: ordered. Radiology: ordered.  Risk Prescription drug management. Decision regarding hospitalization.   This patient complains of malaise fatigue right-sided headache cough abdominal pain; this involves an extensive number of treatment Options and is a complaint that carries with it a high risk of complications and morbidity. The differential includes migraine, dehydration, hyperglycemia, DKA, pneumonia, COVID, flu, abdominal infection, UTI temporal arteritis, stroke  I ordered, reviewed and interpreted labs, which included CBC with normal white count normal hemoglobin, chemistries with low sodium elevated glucose elevated creatinine, newly elevated T. bili, urinalysis possible infection sent for culture, COVID-negative, sed rate markedly elevated, blood culture sent, TSH BMP troponins unremarkable I ordered medication IV fluids IV antibiotics nausea medication, headache cocktail movement and her symptoms and reviewed PMP when indicated. I ordered imaging studies which included chest x-ray and I independently    visualized and interpreted imaging which showed probable left lower lobe infiltrate Additional history obtained from patient's family members Previous records obtained and reviewed in epic, patient seen in the emergency department last month for right-sided flank abdominal pain thought to be muscular I consulted Dr. Nevada Crane [facility] hospitalist and discussed lab and imaging findings and discussed disposition.  Cardiac monitoring reviewed, normal sinus rhythm Social determinants considered, patient  has increased stressors and poor physical activity Critical Interventions: None  After the interventions stated above, I reevaluated the patient and found patient's oxygen sat to be improved after being placed on oxygen.  Still appears very fatigued. Admission and further testing considered, patient would benefit from admission for IV antibiotics and continued hydration, oxygen support.  Also she may need further imaging.  Patient in agreement with plan for admission.         Final Clinical Impression(s) / ED Diagnoses Final diagnoses:  Community acquired pneumonia of  left lower lobe of lung  Lower urinary tract infection  Elevated sedimentation rate    Rx / DC Orders ED Discharge Orders     None         Hayden Rasmussen, MD 08/23/21 [phone]

## 2021-08-22 NOTE — ED Notes (Signed)
RT Note: Pt. seen per RN request due to periodic desaturations, assessment done and placed on 3 lpm n/c to maintain Oxygen saturations of >94%.

## 2021-08-22 NOTE — ED Triage Notes (Signed)
Pt c/o generalized weakness, fatigue, headache and intermittent difficulty breathing over the past 4 days. Pt is alert and oriented x 4. Family member does report some intermittent episodes of confusion.

## 2021-08-22 NOTE — ED Notes (Signed)
Handoff report given to [facility] 

## 2021-08-22 NOTE — ED Notes (Signed)
Handoff report given to Rosemarie Beath on [place] at [facility]

## 2021-08-22 NOTE — ED Notes (Signed)
RT assessed pt for oxygen need. Pt sats currently on [place] 2 LPM at 95%. Pt respiratory status stable w/no distress noted at this time. RT will continue to monitor.

## 2021-08-22 NOTE — H&P (Signed)
History and Physical    Misty Cervantes LOV:[id number] DOB: [birth year] DOA: 08/22/2021  PCP: Jeanie Sewer, NP  Patient coming from: [facility]  Chief Complaint: Multiple complaints  HPI: Misty Cervantes is a 76 y.o. female with medical history significant of hypertension, hyperlipidemia, type 2 diabetes, CKD stage IIIa presented to the ED with multiple complaints including 3 days of malaise, fatigue, anorexia, poor p.o. intake, nonproductive cough, chest tightness, generalized abdominal pain, diarrhea, and right-sided temporal headache. Oxygen saturation in the 80s on room air, placed on 2 L supplemental oxygen.  Labs showing WBC 9.0, hemoglobin 13.7, platelet count 251k.  Sodium 130, potassium 3.7, chloride 92, bicarb 24, BUN 20, creatinine 1.1, glucose 217.  T. bili 1.7, remainder of LFTs normal.  Lipase normal.  Lactic acid 1.4.  ESR 83 and CRP 28.8.Marland Kitchen  TSH normal.  UA with large amount of leukocytes and microscopy showing greater than 50 WBCs and many bacteria.  Urine culture pending.  Blood culture x2 pending.  High-sensitivity troponin negative x2.  SARS-CoV-2 PCR negative.  BNP normal.  Chest x-ray showing mild left basilar atelectasis and/or infiltrate. Patient was given Toradol, Reglan, Zofran, ceftriaxone, azithromycin, and 1 L normal saline bolus.  Patient is somnolent.  History provided mostly by her granddaughter at bedside.  Granddaughter lives with the patient.  She reports patient having 3-day history of generalized weakness/fatigue, somnolence, poor p.o. intake, abdominal pain, shortness of breath, and nonproductive cough.  No vomiting or diarrhea.  Also patient has been complaining of right-sided severe throbbing headaches in the temporal region.  Per granddaughter, she does not have a history of headaches and this is a new problem.  Her headache has improved after she received medications in the ED.  Patient denies any change in her vision or jaw pain with chewing.  Denies nausea,  vomiting, or photophobia.  Patient denies chest pain.  Complaining of dysuria.  No additional history could be obtained from her.  Review of Systems:  Review of Systems  All other systems reviewed and are negative.   Past Medical History:  Diagnosis Date   Chronic kidney disease (CKD), stage III (moderate) ([place])    followed by pcp   Full dentures    History of 2019 novel coronavirus disease (COVID-19) 04/05/2019   result in care everywhere ,  per pt mild symptoms that resolved   History of gastric ulcer 2013   non-bleeding   Hyperlipemia    Mixed stress and urge incontinence    Osteoarthritis    Presence of pessary    Type 2 diabetes mellitus ([place])    followed by pcp   (08-26-2020 per pt checks blood sugar twice weekly, unsure what fasting sugar is)   Vaginal vault 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    gyn--- dr Talbert Nan, using pessary   Wears glasses     Past Surgical History:  Procedure Laterality Date   CARPAL TUNNEL RELEASE Right    1980s   CATARACT EXTRACTION W/ INTRAOCULAR LENS IMPLANT Bilateral 2013   approx   COLONOSCOPY WITH ESOPHAGOGASTRODUODENOSCOPY (EGD)  11/17/2011   COLPOCLEISIS N/A 09/02/2020   Procedure: COLPOCLEISIS, PERINEORRHAPHY;  Surgeon: Salvadore Dom, MD;  Location: [facility];  Service: Gynecology;  Laterality: N/A;   CYSTOSCOPY N/A 09/02/2020   Procedure: CYSTOSCOPY;  Surgeon: Salvadore Dom, MD;  Location: [facility];  Service: Gynecology;  Laterality: N/A;   RETINAL DETACHMENT SURGERY Right 2003   TOTAL HIP ARTHROPLASTY Left 03/02/2010   _0    TOTAL  KNEE ARTHROPLASTY Right 11/03/2015   Procedure: RIGHT TOTAL KNEE ARTHROPLASTY;  Surgeon: Gaynelle Arabian, MD;  Location: [facility];  Service: Orthopedics;  Laterality: Right;   TOTAL VAGINAL HYSTERECTOMY  2012   TVH, anterior repair, TVT (no mesh used for the anterior repair)     reports that she has never smoked. She has never used smokeless tobacco. She reports that  she does not drink alcohol and does not use drugs.  Allergies  Allergen Reactions   Norvasc [Amlodipine] Swelling    Family History  Problem Relation Age of Onset   Diabetes Mother        deceased   Heart attack Mother    Diabetes Sister        x2   Kidney cancer Father    Heart attack Father    Diabetes Sister     Prior to Admission medications   Medication Sig Start Date End Date Taking? Authorizing Provider  Accu-Chek FastClix Lancets MISC Check blood sugars 1-2 times per day. 01/25/19   Orma Flaming, MD  acetaminophen (TYLENOL) 325 MG tablet Take 650 mg by mouth as needed.    [provider]  albuterol (VENTOLIN HFA) 108 (90 Base) MCG/ACT inhaler TAKE 2 PUFFS BY MOUTH EVERY 6 HOURS AS NEEDED FOR WHEEZE OR SHORTNESS OF BREATH Patient not taking: Reported on 07/16/2021 12/09/19   Orma Flaming, MD  aspirin EC 81 MG tablet Take 81 mg by mouth daily. Swallow whole.    [provider]  Cyanocobalamin (B-12 PO) Take 1 tablet by mouth daily.    [provider]  fluticasone (FLONASE) 50 MCG/ACT nasal spray SPRAY 1 SPRAY INTO EACH NOSTRIL TWICE A DAY AS NEEDED FOR ALLERGIES OR RHINITIS 07/17/21   Hudnell, Colletta Maryland, NP  glipiZIDE (GLUCOTROL XL) 5 MG 24 hr tablet TAKE 1 TABLET BY MOUTH EVERY DAY WITH BREAKFAST 05/28/21   Jeanie Sewer, NP  glucose blood (ACCU-CHEK GUIDE) test strip TEST FOUR TIMES DAILY 11/21/20   Vivi Barrack, MD  losartan-hydrochlorothiazide (HYZAAR) 50-12.5 MG tablet TAKE 1 TABLET BY MOUTH EVERY DAY 07/29/21   Jeanie Sewer, NP  meclizine (ANTIVERT) 25 MG tablet TAKE 1 TABLET BY MOUTH 3 TIMES A DAY AS NEEDED FOR DIZZINESS 04/03/21   Jeanie Sewer, NP  Multiple Vitamin (MULTIVITAMIN WITH MINERALS) TABS tablet Take 1 tablet by mouth daily.    [provider]  pravastatin (PRAVACHOL) 40 MG tablet TAKE 1 TABLET BY MOUTH EVERY DAY 07/16/21   Jeanie Sewer, NP  traZODone (DESYREL) 50 MG tablet TAKE 1/2 TO 1 TABLET BY MOUTH AT  BEDTIME AS NEEDED FOR SLEEP Patient taking differently: Take 50 mg by mouth at bedtime as needed. TAKE 1/2 TO 1 TABLET BY MOUTH AT BEDTIME AS NEEDED FOR SLEEP 07/07/20   Orma Flaming, MD    Physical Exam: Vitals:   08/22/21 2130 08/22/21 2200 08/22/21 2203 08/22/21 2244  BP: (!) 102/59 (!) 117/51  121/66  Pulse: 77 76  81  Resp: (!) _0 Temp:   98.6 F (37 C) 98.2 F (36.8 C)  TempSrc:   Oral Oral  SpO2: 94% 96%  94%  Weight:    69.3 kg  Height:    _1  (1.499 m)    Physical Exam Vitals reviewed.  Constitutional:      General: She is not in acute distress. HENT:     Head: Normocephalic and atraumatic.  Eyes:     Extraocular Movements: Extraocular movements intact.     Conjunctiva/sclera: Conjunctivae  normal.  Cardiovascular:     Rate and Rhythm: Normal rate and regular rhythm.     Pulses: Normal pulses.  Pulmonary:     Effort: Pulmonary effort is normal. No respiratory distress.     Breath sounds: No wheezing or rales.  Abdominal:     General: Bowel sounds are normal. There is no distension.     Palpations: Abdomen is soft.     Tenderness: There is no abdominal tenderness. There is no guarding or rebound.  Musculoskeletal:        General: No swelling or tenderness.     Cervical back: Normal range of motion.  Skin:    General: Skin is warm and dry.  Neurological:     General: No focal deficit present.     Mental Status: She is alert and oriented to person, place, and time.     Comments: Somnolent      Labs on Admission: I have personally reviewed following labs and imaging studies  CBC: Recent Labs  Lab 08/22/21 1540  WBC 9.0  NEUTROABS 6.5  HGB 13.7  HCT 41.0  MCV 93.0  PLT 030   Basic Metabolic Panel: Recent Labs  Lab 08/22/21 1540  NA 130*  K 3.7  CL 92*  CO2 24  GLUCOSE 217*  BUN 20  CREATININE 1.13*  CALCIUM 9.8   GFR: Estimated Creatinine Clearance: 35.8 mL/min (A) (by C-G formula based on SCr of 1.13 mg/dL (H)). Liver  Function Tests: Recent Labs  Lab 08/22/21 1540  AST 18  ALT 20  ALKPHOS 102  BILITOT 1.7*  PROT 8.0  ALBUMIN 4.2   Recent Labs  Lab 08/22/21 1540  LIPASE 45   No results for input(s): "AMMONIA" in the last 168 hours. Coagulation Profile: No results for input(s): "INR", "PROTIME" in the last 168 hours. Cardiac Enzymes: No results for input(s): "CKTOTAL", "CKMB", "CKMBINDEX", "TROPONINI" in the last 168 hours. BNP (last 3 results) No results for input(s): "PROBNP" in the last 8760 hours. HbA1C: No results for input(s): "HGBA1C" in the last 72 hours. CBG: Recent Labs  Lab 08/22/21 1543  GLUCAP 188*   Lipid Profile: No results for input(s): "CHOL", "HDL", "LDLCALC", "TRIG", "CHOLHDL", "LDLDIRECT" in the last 72 hours. Thyroid Function Tests: Recent Labs    08/22/21 1540  TSH 0.999   Anemia Panel: No results for input(s): "VITAMINB12", "FOLATE", "FERRITIN", "TIBC", "IRON", "RETICCTPCT" in the last 72 hours. Urine analysis:    Component Value Date/Time   COLORURINE YELLOW 08/22/2021 1715   APPEARANCEUR HAZY (A) 08/22/2021 1715   LABSPEC 1.018 08/22/2021 1715   PHURINE 5.5 08/22/2021 1715   GLUCOSEU NEGATIVE 08/22/2021 1715   HGBUR SMALL (A) 08/22/2021 1715   BILIRUBINUR NEGATIVE 08/22/2021 1715   BILIRUBINUR neg 05/11/2017 1123   KETONESUR 15 (A) 08/22/2021 1715   PROTEINUR 100 (A) 08/22/2021 1715   UROBILINOGEN 0.2 06/17/2016 1122   UROBILINOGEN 0.2 02/23/2010 1235   NITRITE NEGATIVE 08/22/2021 1715   LEUKOCYTESUR LARGE (A) 08/22/2021 1715    Radiological Exams on Admission: I have personally reviewed images DG Chest Port 1 View  Result Date: 08/22/2021 CLINICAL DATA:  Headache and difficulty breathing x4 days. EXAM: PORTABLE CHEST 1 VIEW COMPARISON:  November 26, 2019 FINDINGS: The heart size and mediastinal contours are within normal limits. There is moderate severity calcification of the thoracic aorta. Mild atelectasis and/or infiltrate is seen within  the left lung base. There is no evidence of a pleural effusion or pneumothorax. Multilevel degenerative changes seen throughout the  thoracic spine. IMPRESSION: Mild left basilar atelectasis and/or infiltrate. Electronically Signed   By: Virgina Norfolk M.D.   On: 08/22/2021 15:52    EKG: Independently reviewed.  Sinus tachycardia.  First-degree AV block seen on previous tracing as well.  No acute ischemic changes.  Assessment and Plan  Right-sided headache Patient with 3-day history of severe right-sided throbbing headache in the temporal region.  ?Giant cell arteritis given elevated ESR and CRP, however, no vision loss or jaw claudication.  No fevers.  CT could not be done in the ED due to issues with the scanner.  Discussed with Dr. Erlinda Hong, recommending holding off starting high-dose steroids at this time.  He recommends obtaining brain MRI and MRV and blood cultures.  Neurology will consult. -Appreciate neurology recommendations -Stat brain MRI and MRV -Blood cultures  Community-acquired pneumonia Acute hypoxic respiratory failure Chest x-ray showing mild left basilar atelectasis and/or infiltrate.  No fever, leukocytosis, or signs of sepsis.  SARS-CoV-2 PCR negative.  Given hypoxia and complaints of shortness of breath and cough, high suspicion for pneumonia.  Hypoxic to the 80s on room air, currently requiring 2 L supplemental oxygen. -Continue ceftriaxone and azithromycin -Strep pneumo and Legionella antigens -Blood cultures -CT chest ordered for further evaluation -Continue supplemental oxygen, wean as tolerated  UTI UA with large amount of leukocytes and microscopy showing greater than 50 WBCs and many bacteria.  No signs of sepsis.   -Continue ceftriaxone -Urine culture pending   Generalized abdominal pain No vomiting or diarrhea.  T. bili mildly elevated but remainder of LFTs normal.  Lipase normal. -CT abdomen pelvis pending  Mild hyponatremia Likely due to poor oral  intake. -Continue gentle IV fluid hydration- NS_0  cc/hr -Continue to monitor sodium level closely  Hypertension Stable. -Pharmacy med rec pending  Hyperlipidemia -Pharmacy med rec pending  Type 2 diabetes A1c 9.2. -Sensitive sliding scale insulin -Hold glipizide  CKD stage IIIa Renal function stable. -Continue to monitor  DVT prophylaxis: SCDs Code Status: Full Code (discussed with the patient) Family Communication: Daughter at bedside. Consults called: Neurology Level of care: Progressive Care Unit Admission status: It is my clinical opinion that admission to INPATIENT is reasonable and necessary because of the expectation that this patient will require hospital care that crosses at least 2 midnights to treat this condition based on the medical complexity of the problems presented.  Given the aforementioned information, the predictability of an adverse outcome is felt to be significant.   Shela Leff MD [facility]  If 7PM-7AM, please contact night-coverage [email or web address]  08/22/2021, 11:25 PM

## 2021-08-23 ENCOUNTER — Inpatient Hospital Stay (HOSPITAL_COMMUNITY): Payer: Medicare Other

## 2021-08-23 DIAGNOSIS — R519 Headache, unspecified: Secondary | ICD-10-CM

## 2021-08-23 DIAGNOSIS — J9601 Acute respiratory failure with hypoxia: Secondary | ICD-10-CM | POA: Diagnosis not present

## 2021-08-23 DIAGNOSIS — N390 Urinary tract infection, site not specified: Secondary | ICD-10-CM

## 2021-08-23 DIAGNOSIS — J189 Pneumonia, unspecified organism: Secondary | ICD-10-CM

## 2021-08-23 DIAGNOSIS — E871 Hypo-osmolality and hyponatremia: Secondary | ICD-10-CM

## 2021-08-23 DIAGNOSIS — R109 Unspecified abdominal pain: Secondary | ICD-10-CM

## 2021-08-23 DIAGNOSIS — N1831 Chronic kidney disease, stage 3a: Secondary | ICD-10-CM | POA: Diagnosis not present

## 2021-08-23 DIAGNOSIS — E1165 Type 2 diabetes mellitus with hyperglycemia: Secondary | ICD-10-CM

## 2021-08-23 DIAGNOSIS — N10 Acute tubulo-interstitial nephritis: Secondary | ICD-10-CM

## 2021-08-23 HISTORY — DX: Urinary tract infection, site not specified: N39.0

## 2021-08-23 HISTORY — DX: Unspecified abdominal pain: R10.9

## 2021-08-23 HISTORY — DX: Hypo-osmolality and hyponatremia: E87.1

## 2021-08-23 HISTORY — DX: Headache, unspecified: R51.9

## 2021-08-23 LAB — COMPREHENSIVE METABOLIC PANEL
ALT: 19 U/L (ref 0–44)
AST: 18 U/L (ref 15–41)
Albumin: 2.8 g/dL — ABNORMAL LOW (ref 3.5–5.0)
Alkaline Phosphatase: 86 U/L (ref 38–126)
Anion gap: 10 (ref 5–15)
BUN: 23 mg/dL (ref 8–23)
CO2: 23 mmol/L (ref 22–32)
Calcium: 8 mg/dL — ABNORMAL LOW (ref 8.9–10.3)
Chloride: 100 mmol/L (ref 98–111)
Creatinine, Ser: 1.44 mg/dL — ABNORMAL HIGH (ref 0.44–1.00)
GFR, Estimated: 38 mL/min — ABNORMAL LOW (ref >60)
Glucose, Bld: 154 mg/dL — ABNORMAL HIGH (ref 70–99)
Potassium: 3.6 mmol/L (ref 3.5–5.1)
Sodium: 133 mmol/L — ABNORMAL LOW (ref 135–145)
Total Bilirubin: 1.2 mg/dL (ref 0.3–1.2)
Total Protein: 6.1 g/dL — ABNORMAL LOW (ref 6.5–8.1)

## 2021-08-23 LAB — CREATININE, SERUM
Creatinine, Ser: 1.13 mg/dL — ABNORMAL HIGH (ref 0.44–1.00)
GFR, Estimated: 50 mL/min — ABNORMAL LOW (ref >60)

## 2021-08-23 LAB — GLUCOSE, CAPILLARY
Glucose-Capillary: 141 mg/dL — ABNORMAL HIGH (ref 70–99)
Glucose-Capillary: 173 mg/dL — ABNORMAL HIGH (ref 70–99)
Glucose-Capillary: 150 mg/dL — ABNORMAL HIGH (ref 70–99)
Glucose-Capillary: 241 mg/dL — ABNORMAL HIGH (ref 70–99)

## 2021-08-23 LAB — MRSA NEXT GEN BY PCR, NASAL: MRSA by PCR Next Gen: NOT DETECTED

## 2021-08-23 LAB — HEMOGLOBIN A1C
Hgb A1c MFr Bld: 9.2 % — ABNORMAL HIGH (ref 4.8–5.6)
Mean Plasma Glucose: 217.34 mg/dL

## 2021-08-23 LAB — LACTIC ACID, PLASMA: Lactic Acid, Venous: 1 mmol/L (ref 0.5–1.9)

## 2021-08-23 MED ORDER — IOHEXOL 300 MG/ML  SOLN
100.0000 mL | Freq: Once | INTRAMUSCULAR | Status: AC | PRN
  Administered 2021-08-23: 75 mL via INTRAVENOUS

## 2021-08-23 MED ORDER — INSULIN ASPART 100 UNIT/ML IJ SOLN
0.0000 [IU] | Freq: Three times a day (TID) | INTRAMUSCULAR | Status: DC
  Administered 2021-08-23 (×2): 1 [IU] via SUBCUTANEOUS
  Administered 2021-08-23: 2 [IU] via SUBCUTANEOUS
  Administered 2021-08-24 (×2): 1 [IU] via SUBCUTANEOUS

## 2021-08-23 MED ORDER — ACETAMINOPHEN 650 MG RE SUPP: 650.0000 mg | Freq: Four times a day (QID) | RECTAL | Status: DC | PRN

## 2021-08-23 MED ORDER — PRAVASTATIN SODIUM 40 MG PO TABS
40.0000 mg | ORAL_TABLET | Freq: Every day | ORAL | Status: DC
  Administered 2021-08-23: 40 mg via ORAL
  Filled 2021-08-23: qty 1

## 2021-08-23 MED ORDER — ENOXAPARIN SODIUM 30 MG/0.3ML IJ SOSY
30.0000 mg | PREFILLED_SYRINGE | INTRAMUSCULAR | Status: DC
  Administered 2021-08-23: 30 mg via SUBCUTANEOUS
  Filled 2021-08-23: qty 0.3

## 2021-08-23 MED ORDER — SODIUM CHLORIDE 0.9 % IV SOLN
500.0000 mg | INTRAVENOUS | Status: DC
  Administered 2021-08-23: 500 mg via INTRAVENOUS
  Filled 2021-08-23 (×3): qty 5

## 2021-08-23 MED ORDER — INSULIN ASPART 100 UNIT/ML IJ SOLN
0.0000 [IU] | Freq: Every day | INTRAMUSCULAR | Status: DC
  Administered 2021-08-23: 2 [IU] via SUBCUTANEOUS

## 2021-08-23 MED ORDER — SODIUM CHLORIDE 0.9 % IV SOLN: INTRAVENOUS | Status: AC

## 2021-08-23 MED ORDER — ACETAMINOPHEN 325 MG PO TABS
650.0000 mg | ORAL_TABLET | Freq: Four times a day (QID) | ORAL | Status: DC | PRN
  Administered 2021-08-23 (×3): 650 mg via ORAL
  Filled 2021-08-23 (×3): qty 2

## 2021-08-23 MED ORDER — ASPIRIN 81 MG PO TBEC
81.0000 mg | DELAYED_RELEASE_TABLET | Freq: Every day | ORAL | Status: DC
  Administered 2021-08-23 – 2021-08-24 (×2): 81 mg via ORAL
  Filled 2021-08-23 (×2): qty 1

## 2021-08-23 MED ORDER — ADULT MULTIVITAMIN W/MINERALS CH
1.0000 | ORAL_TABLET | Freq: Every day | ORAL | Status: DC
  Administered 2021-08-23 – 2021-08-24 (×2): 1 via ORAL
  Filled 2021-08-23 (×2): qty 1

## 2021-08-23 MED ORDER — SODIUM CHLORIDE 0.9 % IV SOLN
1.0000 g | INTRAVENOUS | Status: DC
Start: 2021-08-23 — End: 2021-08-24
  Administered 2021-08-23: 1 g via INTRAVENOUS
  Filled 2021-08-23: qty 10

## 2021-08-23 MED ORDER — ENOXAPARIN SODIUM 40 MG/0.4ML IJ SOSY
40.0000 mg | PREFILLED_SYRINGE | INTRAMUSCULAR | Status: DC
  Administered 2021-08-24: 40 mg via SUBCUTANEOUS
  Filled 2021-08-23: qty 0.4

## 2021-08-23 NOTE — Progress Notes (Signed)
  Transition of Care [facility]) Screening Note   Patient Details  Name: Misty Cervantes Date of Birth: April 16, 1945   Transition of [facility]) CM/SW Contact:    Bary Castilla, LCSW Phone Number: 08/23/2021, 2:08 PM    Transition of Care Department [facility]) has reviewed patient and no TOC needs have been identified at this time. We will continue to monitor patient advancement through interdisciplinary progression rounds. If new patient transition needs arise, please place a TOC consult.

## 2021-08-23 NOTE — Progress Notes (Signed)
PROGRESS NOTE    NYASHA Cervantes  KTG:[id number] DOB: 04-10-1945 DOA: 08/22/2021 PCP: Jeanie Sewer, NP   Brief Narrative: Misty Cervantes is a 76 year old female with a history of hypertension, hyperlipidemia, diabetes mellitus type 2, CKD stage IIIa.  Patient presented secondary to 3 days of malaise, fatigue, anorexia, poor p.o. intake and nonproductive cough in addition to right-sided headache.  On admission, imaging concerning for left sided infiltrate versus atelectasis.  Concern for possible pneumonia.  Patient started on empiric antibiotics.  Urinalysis also concerning for possible UTI.  No obvious symptoms per patient.  Blood and urine cultures obtained on admission.  Empiric ceftriaxone and azithromycin initiated on admission.  Neurology was consulted for right-sided headache with initial concern for possible migraine versus temporal arteritis versus cerebral sinus venous thrombosis.  MRI brain/MRV head with without acute process.   Assessment and Plan:  Community-acquired pneumonia Confirmed on CT chest.  Evidence of bilateral lower lobe airspace consolidation, left greater than right.  Patient started empirically on ceftriaxone and azithromycin.  Blood cultures obtained on admission.  Urine strep pneumonia and Legionella pending. -Continue ceftriaxone and azithromycin -Follow-up blood cultures and strep pneumonia/Legionella urine testing  Acute respiratory failure with hypoxia Patient is not on oxygen as an outpatient.  Patient with SPO2 down to 83% with evidence of tachypnea as well.  Patient initially started on 3 L/min via nasal cannula.  Secondary to pneumonia. -Wean to room air as able -Ambulatory pulse oximetry  Right-sided headache Initial concern for temporal arteritis given elevated ESR and CRP.  Neurology consulted and recommended imaging.  MRI and MRV significant for no acute process.  Headache resolved without treatment for possible temporal arteritis.   Currently resolved.  UTI/pyelonephritis Diagnosed on admission.  Patient with urinalysis concerning for possible infection.  CT abdomen/pelvis concerning for evidence of possible left kidney infection.  Patient is on antibiotics for treatment of community-acquired pneumonia.  Urine culture was obtained and is currently pending. -Follow-up urine culture -Antibiotics as mentioned above  Generalized abdominal pain Unclear etiology but appears to be now resolved.  Patient received CT abdomen and pelvis which was significant for no acute process.  Currently resolved.  Hyponatremia Sodium down to 130 on admission.  Complicated by hydrochlorothiazide usage as an outpatient.  Patient started on IV fluids with movement.  CKD stage IIIa Baseline creatinine appears to be around 1.2.  Creatinine up to 1.44 this morning.  Patient is on losartan-hydrochlorothiazide as an outpatient. -Repeat creatinine this afternoon  Primary hypertension Blood pressure currently stable.  Patient is on losartan-hydrochlorothiazide as an outpatient which were held on admission.  Blood pressure soft to normotensive this admission. -Continue to hold outpatient antihypertensives for now.  Hyperlipidemia Patient is on pravastatin as an outpatient.  Diabetes mellitus, type II Uncontrolled with hyperglycemia.  Patient is on glipizide as an outpatient which was held on admission.  Started on sign scale insulin for inpatient management. -Continue sliding scale insulin   DVT prophylaxis: Lovenox Code Status:   Code Status: Full Code Family Communication: Granddaughter at bedside Disposition Plan: Discharge home likely in 24 hours pending culture results and transition to oral antibiotics   Consultants:  Neurology  Procedures:  None  Antimicrobials: Ceftriaxone Azithromycin   Subjective: Patient reports feeling better than yesterday.  Currently she is very hungry.  She is just waking up.  Objective: BP 137/64  (BP Location: Left Arm)   Pulse 87   Temp 98 F (36.7 C) (Oral)   Resp 20   Ht  4' 11"  (1.499 m)   Wt 69.3 kg   LMP  (LMP Unknown)   SpO2 95%   BMI 30.86 kg/m   Examination:  General exam: Appears calm and comfortable Respiratory system: Clear to auscultation. Respiratory effort normal. Cardiovascular system: S1 & S2 heard, RRR. No murmurs, rubs, gallops or clicks. Gastrointestinal system: Abdomen is nondistended, soft and nontender. Normal bowel sounds heard. Central nervous system: Alert and oriented. No focal neurological deficits. Musculoskeletal: No edema. No calf tenderness Skin: No cyanosis. Psychiatry: Judgement and insight appear normal. Mood & affect appropriate.    Data Reviewed: I have personally reviewed following labs and imaging studies  CBC Lab Results  Component Value Date   WBC 9.0 08/22/2021   RBC 4.41 08/22/2021   HGB 13.7 08/22/2021   HCT 41.0 08/22/2021   MCV 93.0 08/22/2021   MCH 31.1 08/22/2021   PLT 251 08/22/2021   MCHC 33.4 08/22/2021   RDW 12.7 08/22/2021   LYMPHSABS 0.8 08/22/2021   MONOABS 1.1 (H) 08/22/2021   EOSABS 0.0 08/22/2021   BASOSABS 0.4 (H) 81/82/9937     Last metabolic panel Lab Results  Component Value Date   NA 133 (L) 08/23/2021   K 3.6 08/23/2021   CL 100 08/23/2021   CO2 23 08/23/2021   BUN 23 08/23/2021   CREATININE 1.44 (H) 08/23/2021   GLUCOSE 154 (H) 08/23/2021   GFRNONAA 38 (L) 08/23/2021   GFRAA 50 (L) 02/13/2020   CALCIUM 8.0 (L) 08/23/2021   PROT 6.1 (L) 08/23/2021   ALBUMIN 2.8 (L) 08/23/2021   LABGLOB 2.4 08/25/2015   AGRATIO 1.8 08/25/2015   BILITOT 1.2 08/23/2021   ALKPHOS 86 08/23/2021   AST 18 08/23/2021   ALT 19 08/23/2021   ANIONGAP 10 08/23/2021    GFR: Estimated Creatinine Clearance: 28.1 mL/min (A) (by C-G formula based on SCr of 1.44 mg/dL (H)).  Recent Results (from the past 240 hour(s))  SARS Coronavirus 2 by RT PCR (hospital order, performed in [facility] hospital lab)  *cepheid single result test* Anterior Nasal Swab     Status: None   Collection Time: 08/22/21  3:40 PM   Specimen: Anterior Nasal Swab  Result Value Ref Range Status   SARS Coronavirus 2 by RT PCR NEGATIVE NEGATIVE Final    Comment: (NOTE) SARS-CoV-2 target nucleic acids are NOT DETECTED.  The SARS-CoV-2 RNA is generally detectable in upper and lower respiratory specimens during the acute phase of infection. The lowest concentration of SARS-CoV-2 viral copies this assay can detect is 250 copies / mL. A negative result does not preclude SARS-CoV-2 infection and should not be used as the sole basis for treatment or other patient management decisions.  A negative result may occur with improper specimen collection / handling, submission of specimen other than nasopharyngeal swab, presence of viral mutation(s) within the areas targeted by this assay, and inadequate number of viral copies (<250 copies / mL). A negative result must be combined with clinical observations, patient history, and epidemiological information.  Fact Sheet for Patients:   https://www.patel.info/  Fact Sheet for Healthcare Providers: https://hall.com/  This test is not yet approved or  cleared by the [place] FDA and has been authorized for detection and/or diagnosis of SARS-CoV-2 by FDA under an Emergency Use Authorization (EUA).  This EUA will remain in effect (meaning this test can be used) for the duration of the COVID-19 declaration under Section 564(b)(1) of the Act, 21 U.S.C. section 360bbb-3(b)(1), unless the authorization is terminated or revoked sooner.  Performed at [facility], [address], [place], [place] [address]   MRSA Next Gen by PCR, Nasal     Status: None   Collection Time: 08/22/21 11:02 PM   Specimen: Nasal Mucosa; Nasal Swab  Result Value Ref Range Status   MRSA by PCR Next Gen NOT DETECTED NOT DETECTED Final     Comment: (NOTE) The GeneXpert MRSA Assay (FDA approved for NASAL specimens only), is one component of a comprehensive MRSA colonization surveillance program. It is not intended to diagnose MRSA infection nor to guide or monitor treatment for MRSA infections. Test performance is not FDA approved in patients less than 2 years old. Performed at [facility], [place] [address], [place], [place] [address]       Radiology Studies: CT CHEST ABDOMEN PELVIS W CONTRAST  Result Date: 08/23/2021 CLINICAL DATA:  Abdominal pain. Hypertension, hyperlipidemia and diabetes. EXAM: CT CHEST, ABDOMEN, AND PELVIS WITH CONTRAST TECHNIQUE: Multidetector CT imaging of the chest, abdomen and pelvis was performed following the standard protocol during bolus administration of intravenous contrast. RADIATION DOSE REDUCTION: This exam was performed according to the departmental dose-optimization program which includes automated exposure control, adjustment of the mA and/or kV according to patient size and/or use of iterative reconstruction technique. CONTRAST:  68m OMNIPAQUE IOHEXOL 300 MG/ML  SOLN COMPARISON:  CT chest, abdomen and pelvis from 06/20/2019 FINDINGS: CT CHEST FINDINGS Cardiovascular: Heart size appears normal. Aortic atherosclerosis and coronary artery calcifications. No pericardial effusion identified. Mediastinum/Nodes: Thyroid gland, trachea and esophagus are unremarkable. No enlarged axillary, supraclavicular, mediastinal, or hilar lymph nodes. Lungs/Pleura: No pleural effusion. Mild cylindrical bronchiectasis and bronchiolectasis identified within the right lower lobe. Bilateral lower lobe airspace consolidation is identified, left greater than right. Imaging findings compatible with pneumonia and or aspiration. Mild subpleural consolidation within the posterolateral right upper lobe is also noted. No signs of interstitial edema or pneumothorax. Musculoskeletal: Age-indeterminate fracture involves the  lateral aspect of the right tenth rib. The remaining osseous structures appear intact. CT ABDOMEN PELVIS FINDINGS Hepatobiliary: No suspicious liver abnormality. Gallbladder appears normal. No bile duct dilatation. Pancreas: Unremarkable. No pancreatic ductal dilatation or surrounding inflammatory changes. Spleen: Normal in size without focal abnormality. Adrenals/Urinary Tract: Normal adrenal glands. Left-sided pelvocaliectasis with mild haziness within the left renal sinus fat. No obstructing stone identified lung the course of either ureter. No kidney mass noted. Bladder appears within normal limits. Stomach/Bowel: Tiny hiatal hernia. The appendix is visualized and appears normal no signs of bowel wall thickening, inflammation or distension. Vascular/Lymphatic: Aortic atherosclerosis. No aneurysm. No signs of abdominopelvic adenopathy. Reproductive: Uterus appears surgically absent. No adnexal mass identified. Other: No free fluid or fluid collections identified. Small fat containing umbilical hernia. No signs of pneumoperitoneum. Musculoskeletal: Previous left hip arthroplasty. First degree anterolisthesis and degenerative disc disease noted at L4-5. No acute or suspicious osseous findings. Mild curvature of the thoracolumbar spine is convex towards the left. IMPRESSION: 1. Bilateral lower lobe airspace consolidation, left greater than right. Imaging findings compatible with pneumonia and/or aspiration. 2. Age-indeterminate fracture involves the lateral aspect of the right tenth rib. 3. Left-sided pelvocaliectasis with mild haziness within the left renal sinus fat. No obstructing stone identified. Correlate for any clinical signs or symptoms of urinary tract infection. 4. Aortic Atherosclerosis (ICD10-I70.0). 5. Coronary artery calcifications. Electronically Signed   By: TKerby MoorsM.D.   On: 08/23/2021 05:56   MR BRAIN WO CONTRAST  Result Date: 08/23/2021 CLINICAL DATA:  Headache, cluster/trigeminal.  EXAM: MRI HEAD WITHOUT CONTRAST  MRV HEAD WITHOUT CONTRAST TECHNIQUE: Multiplanar, multi-echo pulse sequences of the brain and surrounding structures were acquired without intravenous contrast. Angiographic images of the intracranial veins were acquired using MRV technique without intravenous contrast. COMPARISON:  03/01/2019 head CT FINDINGS: MRI HEAD FINDINGS Brain: No acute infarction, hemorrhage, hydrocephalus, extra-axial collection or mass lesion. Mild chronic small vessel ischemia in the cerebral white matter and pons. Brain volume is normal. Vascular: Arterial findings described below.  Preserved flow voids. Skull and upper cervical spine: No focal marrow lesion. Sinuses/Orbits: Clear sinuses. Bilateral cataract resection and right scleral buckle. MRV HEAD FINDINGS Dominant right transverse sigmoid drainage. No dural sinus thrombosis. Occasional filling defect from arachnoid granulation especially at the transverse sigmoid junctions. No evidence of deep vein or cortical vein thrombosis. IMPRESSION: Brain MRI: 1. No acute finding or specific cause for headache. 2. Mild chronic small vessel ischemia. Intracranial MRV: Negative for dural sinus thrombosis. Electronically Signed   By: Jorje Guild M.D.   On: 08/23/2021 05:10   MR MRV HEAD WO CM  Result Date: 08/23/2021 CLINICAL DATA:  Headache, cluster/trigeminal. EXAM: MRI HEAD WITHOUT CONTRAST MRV HEAD WITHOUT CONTRAST TECHNIQUE: Multiplanar, multi-echo pulse sequences of the brain and surrounding structures were acquired without intravenous contrast. Angiographic images of the intracranial veins were acquired using MRV technique without intravenous contrast. COMPARISON:  03/01/2019 head CT FINDINGS: MRI HEAD FINDINGS Brain: No acute infarction, hemorrhage, hydrocephalus, extra-axial collection or mass lesion. Mild chronic small vessel ischemia in the cerebral white matter and pons. Brain volume is normal. Vascular: Arterial findings described below.   Preserved flow voids. Skull and upper cervical spine: No focal marrow lesion. Sinuses/Orbits: Clear sinuses. Bilateral cataract resection and right scleral buckle. MRV HEAD FINDINGS Dominant right transverse sigmoid drainage. No dural sinus thrombosis. Occasional filling defect from arachnoid granulation especially at the transverse sigmoid junctions. No evidence of deep vein or cortical vein thrombosis. IMPRESSION: Brain MRI: 1. No acute finding or specific cause for headache. 2. Mild chronic small vessel ischemia. Intracranial MRV: Negative for dural sinus thrombosis. Electronically Signed   By: Jorje Guild M.D.   On: 08/23/2021 05:10   DG Chest Port 1 View  Result Date: 08/22/2021 CLINICAL DATA:  Headache and difficulty breathing x4 days. EXAM: PORTABLE CHEST 1 VIEW COMPARISON:  November 26, 2019 FINDINGS: The heart size and mediastinal contours are within normal limits. There is moderate severity calcification of the thoracic aorta. Mild atelectasis and/or infiltrate is seen within the left lung base. There is no evidence of a pleural effusion or pneumothorax. Multilevel degenerative changes seen throughout the thoracic spine. IMPRESSION: Mild left basilar atelectasis and/or infiltrate. Electronically Signed   By: Virgina Norfolk M.D.   On: 08/22/2021 15:52      LOS: 1 day    Cordelia Poche, MD [facility] 08/23/2021, 10:16 AM   If 7PM-7AM, please contact night-coverage [email or web address]

## 2021-08-23 NOTE — Plan of Care (Addendum)
Neurology Consult Plan of Care   Patient being followed by neurology on consult for: right sided temporal headache   In brief Misty Cervantes is a 76 y.o. female with PMH of hypertension, hyperlipidemia, type 2 diabetes, CKD stage 3A who presented to [facility] ED with multiple complaints including 3 days of malaise, fatigue, anorexia, poor p.o. intake, nonproductive cough, chest tightness, generalized abdominal pain, diarrhea, and right-sided temporal headache.  Etiology for pt right temporal HA is not quite clear, DDx including migraine or HA in setting of PNA, UTI.  Patient seen resting in bed with family at bedside. She is alert and oriented x 3. NO focal neuro deficits. HA resolved, pain scale 0/10.   Headache work up in process including:  1.ESR 83 and CRP 28.8. 2.WBC 9.0 without fever 3.UA with large amount of leukocytes and microscopy showing greater than 50 WBCs and many bacteria, culture pending 4.Blood culture x2 pending 5. Chest x-ray showing mild left basilar atelectasis and/or infiltrate.   6. MRI brain WO- No acute finding or specific cause for headache. Mild chronic small vessel ischemia. 7. MRV brain WO-Negative for dural sinus thrombosis.   Recommendations: 1.Pain management per primary team 2. As HA resolved and unrevealing work up no recommendation for temporal artery biopsy 3.no steroid to recommend at this time  Neurology will Sign Off with above recommendations. Please call us with any new neurological deficits or concerns     Melquised[place] Journey Evalina Field, NP 08/23/21 8:33 AM

## 2021-08-23 NOTE — Consult Note (Signed)
Neurology Consultation Note  Consult Requested by: Dr. Marlowe Sax  Reason for Consult: Headache  Consult Date: 08/23/21   The history was obtained from the pt.  During history and examination, all items were able to obtain unless otherwise noted.  History of Present Illness:  Misty Cervantes is a 76 y.o. Caucasian female with PMH of hypertension, hyperlipidemia, type 2 diabetes, CKD stage IIIa presented to the ED with multiple complaints including 3 days of malaise, fatigue, anorexia, poor p.o. intake, nonproductive cough, chest tightness, generalized abdominal pain, diarrhea, and right-sided temporal headache. The right temporal HA comes and goes, worse yesterday but today seems much improved. Patient denies any change in her vision, diplopia or jaw claudication with chewing. Currently no HA. ESR 83 and CRP 28.8. WBC 9.0 without fever.  UA with large amount of leukocytes and microscopy showing greater than 50 WBCs and many bacteria.  Urine culture pending.  Blood culture x2 pending. Chest x-ray showing mild left basilar atelectasis and/or infiltrate.    Past Medical History:  Diagnosis Date   Chronic kidney disease (CKD), stage III (moderate) ([place])    followed by pcp   Full dentures    History of 2019 novel coronavirus disease (COVID-19) 04/05/2019   result in care everywhere ,  per pt mild symptoms that resolved   History of gastric ulcer 2013   non-bleeding   Hyperlipemia    Mixed stress and urge incontinence    Osteoarthritis    Presence of pessary    Type 2 diabetes mellitus ([place])    followed by pcp   (08-26-2020 per pt checks blood sugar twice weekly, unsure what fasting sugar is)   Vaginal vault 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    gyn--- dr Talbert Nan, using pessary   Wears glasses     Past Surgical History:  Procedure Laterality Date   CARPAL TUNNEL RELEASE Right    1980s   CATARACT EXTRACTION W/ INTRAOCULAR LENS IMPLANT Bilateral 2013   approx   COLONOSCOPY WITH  ESOPHAGOGASTRODUODENOSCOPY (EGD)  11/17/2011   COLPOCLEISIS N/A 09/02/2020   Procedure: COLPOCLEISIS, PERINEORRHAPHY;  Surgeon: Salvadore Dom, MD;  Location: [facility];  Service: Gynecology;  Laterality: N/A;   CYSTOSCOPY N/A 09/02/2020   Procedure: CYSTOSCOPY;  Surgeon: Salvadore Dom, MD;  Location: [facility];  Service: Gynecology;  Laterality: N/A;   RETINAL DETACHMENT SURGERY Right 2003   TOTAL HIP ARTHROPLASTY Left 03/02/2010   '@[facility]'$    TOTAL KNEE ARTHROPLASTY Right 11/03/2015   Procedure: RIGHT TOTAL KNEE ARTHROPLASTY;  Surgeon: Gaynelle Arabian, MD;  Location: [facility];  Service: Orthopedics;  Laterality: Right;   TOTAL VAGINAL HYSTERECTOMY  2012   TVH, anterior repair, TVT (no mesh used for the anterior repair)    Family History  Problem Relation Age of Onset   Diabetes Mother        deceased   Heart attack Mother    Diabetes Sister        x2   Kidney cancer Father    Heart attack Father    Diabetes Sister     Social History:  reports that she has never smoked. She has never used smokeless tobacco. She reports that she does not drink alcohol and does not use drugs.  Allergies:  Allergies  Allergen Reactions   Norvasc [Amlodipine] Swelling    No current facility-administered medications on file prior to encounter.   Current Outpatient Medications on File Prior to Encounter  Medication Sig Dispense Refill   Accu-Chek FastClix Lancets  MISC Check blood sugars 1-2 times per day. 100 each 2   acetaminophen (TYLENOL) 325 MG tablet Take 650 mg by mouth as needed.     albuterol (VENTOLIN HFA) 108 (90 Base) MCG/ACT inhaler TAKE 2 PUFFS BY MOUTH EVERY 6 HOURS AS NEEDED FOR WHEEZE OR SHORTNESS OF BREATH (Patient not taking: Reported on 07/16/2021) 6.7 each 1   aspirin EC 81 MG tablet Take 81 mg by mouth daily. Swallow whole.     Cyanocobalamin (B-12 PO) Take 1 tablet by mouth daily.     fluticasone (FLONASE) 50 MCG/ACT nasal spray SPRAY 1  SPRAY INTO EACH NOSTRIL TWICE A DAY AS NEEDED FOR ALLERGIES OR RHINITIS 48 mL 0   glipiZIDE (GLUCOTROL XL) 5 MG 24 hr tablet TAKE 1 TABLET BY MOUTH EVERY DAY WITH BREAKFAST 90 tablet 1   glucose blood (ACCU-CHEK GUIDE) test strip TEST FOUR TIMES DAILY 100 strip 0   losartan-hydrochlorothiazide (HYZAAR) 50-12.5 MG tablet TAKE 1 TABLET BY MOUTH EVERY DAY 90 tablet 1   meclizine (ANTIVERT) 25 MG tablet TAKE 1 TABLET BY MOUTH 3 TIMES A DAY AS NEEDED FOR DIZZINESS 60 tablet 1   Multiple Vitamin (MULTIVITAMIN WITH MINERALS) TABS tablet Take 1 tablet by mouth daily.     pravastatin (PRAVACHOL) 40 MG tablet TAKE 1 TABLET BY MOUTH EVERY DAY 90 tablet 1   traZODone (DESYREL) 50 MG tablet TAKE 1/2 TO 1 TABLET BY MOUTH AT BEDTIME AS NEEDED FOR SLEEP (Patient taking differently: Take 50 mg by mouth at bedtime as needed. TAKE 1/2 TO 1 TABLET BY MOUTH AT BEDTIME AS NEEDED FOR SLEEP) 90 tablet 2    Review of Systems: A full ROS was attempted today and was able to be performed.  Systems assessed include - Constitutional, Eyes, HENT, Respiratory, Cardiovascular, Gastrointestinal, Genitourinary, Integument/breast, Hematologic/lymphatic, Musculoskeletal, Neurological, Behavioral/Psych, Endocrine, Allergic/Immunologic - with pertinent responses as per HPI.  Physical Examination: Temp:  [98.2 F (36.8 C)-99.2 F (37.3 C)] 98.2 F (36.8 C) (06/10 2244) Pulse Rate:  [76-104] 81 (06/10 2244) Resp:  [18-30] 20 (06/10 2244) BP: (102-140)/(51-74) 121/66 (06/10 2244) SpO2:  [83 %-98 %] 94 % (06/10 2244) FiO2 (%):  [28 %] 28 % (06/10 2057) Weight:  [65.8 kg-69.3 kg] 69.3 kg (06/10 2244)  General - well nourished, well developed, in no apparent distress, sleepy.    Ophthalmologic - fundi not visualized due to noncooperation.    Cardiovascular - regular rhythm and rate  Mental Status -  Level of arousal and orientation to time, place, and person were intact. Language including expression, naming, repetition,  comprehension, reading, and writing was assessed and found intact. Fund of Knowledge was assessed and was intact.  Cranial Nerves II - XII - II - Vision intact OU. III, IV, VI - Extraocular movements intact. V - Facial sensation intact bilaterally. No temporal area tenderness bilaterally.  VII - Facial movement intact bilaterally. VIII - Hearing & vestibular intact bilaterally. X - Palate elevates symmetrically. XI - Chin turning & shoulder shrug intact bilaterally. XII - Tongue protrusion intact.  Motor Strength - The patient's strength was normal in all extremities and pronator drift was absent.   Motor Tone & Bulk - Muscle tone was assessed at the neck and appendages and was normal.  Bulk was normal and fasciculations were absent.   Reflexes - The patient's reflexes were normal in all extremities and she had no pathological reflexes.  Sensory - Light touch, temperature/pinprick were assessed and were normal.    Coordination - The patient  had normal movements in the hands with no ataxia or dysmetria.  Tremor was absent.  Gait and Station - deferred  Data Reviewed: DG Chest Port 1 View  Result Date: 08/22/2021 CLINICAL DATA:  Headache and difficulty breathing x4 days. EXAM: PORTABLE CHEST 1 VIEW COMPARISON:  November 26, 2019 FINDINGS: The heart size and mediastinal contours are within normal limits. There is moderate severity calcification of the thoracic aorta. Mild atelectasis and/or infiltrate is seen within the left lung base. There is no evidence of a pleural effusion or pneumothorax. Multilevel degenerative changes seen throughout the thoracic spine. IMPRESSION: Mild left basilar atelectasis and/or infiltrate. Electronically Signed   By: Virgina Norfolk M.D.   On: 08/22/2021 15:52   US Abdomen Limited RUQ (LIVER/GB)  Result Date: 07/30/2021 CLINICAL DATA:  Back pain which radiates to RIGHT side. EXAM: ULTRASOUND ABDOMEN LIMITED RIGHT UPPER QUADRANT COMPARISON:  None  Available. FINDINGS: Gallbladder: No gallstones or wall thickening visualized. No sonographic Murphy sign noted by sonographer. Common bile duct: Diameter: Normal at 3 mm Liver: No focal lesion identified. Uniform increase in hepatic echogenicity. Portal vein is patent on color Doppler imaging with normal direction of blood flow towards the liver. Other: None. IMPRESSION: 1. Normal gallbladder and biliary tree. 2. Mild increase in hepatic echogenicity may represent hepatic steatosis. Electronically Signed   By: Suzy Bouchard M.D.   On: 07/30/2021 15:05    Assessment: 76 y.o. female with PMH of hypertension, hyperlipidemia, type 2 diabetes, CKD stage IIIa presented to the ED with multiple complaints including 3 days of malaise, fatigue, anorexia, poor p.o. intake, nonproductive cough, chest tightness, generalized abdominal pain, diarrhea, and right-sided temporal headache. The right temporal HA comes and goes, worse yesterday but today seems much improved. Patient denies any change in her vision, diplopia or jaw claudication with chewing. Currently no HA. ESR 83 and CRP 28.8. WBC 9.0 without fever.  UA showed UTI.  Urine culture pending.  Blood culture x2 pending. Chest x-ray showing mild left basilar atelectasis and/or infiltrate.    Etiology for pt right temporal HA is not quite clear, DDx including migraine, CSVT, or temporal arteritis. Pt does have elevated ESR and CRP but that could be due to UTI and pneumonia. Pt has no other symptoms of TA such as visual disturbance, diplopia or jaw claudication. Will recommend MRI brain and MRV head. Pain management per primary team. If right temporal HA persists or getting worse with unrevealing work up, then consider temporal artery biopsy. Currently again steroids use given pt current infection and no clear diagnosis of TA.   Plan: - MRI brain and MRA head - pain/HA management per primary team.  - If right temporal HA persists or getting worse with unrevealing  work up, then may consider temporal artery biopsy. - no steroid to recommend at this time - will follow.   Thank you for this consultation and allowing [place] to participate in the care of this patient.  Rosalin Hawking, MD PhD Stroke Neurology 08/23/2021 4:23 AM

## 2021-08-24 LAB — GLUCOSE, CAPILLARY
Glucose-Capillary: 156 mg/dL — ABNORMAL HIGH (ref 70–99)
Glucose-Capillary: 133 mg/dL — ABNORMAL HIGH (ref 70–99)

## 2021-08-24 MED ORDER — KETOROLAC TROMETHAMINE 30 MG/ML IJ SOLN
30.0000 mg | Freq: Once | INTRAMUSCULAR | Status: AC
  Administered 2021-08-24: 30 mg via INTRAVENOUS
  Filled 2021-08-24: qty 1

## 2021-08-24 MED ORDER — CEFDINIR 300 MG PO CAPS: 300.0000 mg | ORAL_CAPSULE | Freq: Two times a day (BID) | ORAL | 0 refills | Status: AC

## 2021-08-24 MED ORDER — AZITHROMYCIN 500 MG PO TABS: 500.0000 mg | ORAL_TABLET | Freq: Every day | ORAL | 0 refills | Status: AC

## 2021-08-24 NOTE — Progress Notes (Signed)
Patient given discharge instructions and stated understanding. 

## 2021-08-24 NOTE — Discharge Summary (Signed)
Physician Discharge Summary   Patient: Misty Cervantes MRN: [id number] DOB: January 24, 1946  Admit date:     08/22/2021  Discharge date: 08/24/21  Discharge Physician: Cordelia Poche, MD   PCP: Jeanie Sewer, NP   Recommendations at discharge:  PCP follow-up in 1 weeks Follow-up urine culture  Discharge Diagnoses: Principal Problem:   Headache Active Problems:   Type 2 diabetes mellitus with hyperglycemia, without long-term current use of insulin (HCC)   Chronic kidney disease   CAP (community acquired pneumonia)   UTI (urinary tract infection)   Abdominal pain   Acute respiratory failure with hypoxia ([place])   Hyponatremia  Resolved Problems:   * No resolved hospital problems. *  Hospital Course: Misty Cervantes is a 76 year old female with a history of hypertension, hyperlipidemia, diabetes mellitus type 2, CKD stage IIIa.  Patient presented secondary to 3 days of malaise, fatigue, anorexia, poor p.o. intake and nonproductive cough in addition to right-sided headache.  On admission, imaging concerning for left sided infiltrate versus atelectasis.  Concern for possible pneumonia.  Patient started on empiric antibiotics.  Urinalysis also concerning for possible UTI.  No obvious symptoms per patient.  Blood and urine cultures obtained on admission.  Empiric ceftriaxone and azithromycin initiated on admission.  Neurology was consulted for right-sided headache with initial concern for possible migraine versus temporal arteritis versus cerebral sinus venous thrombosis.  MRI brain/MRV head with without acute process. Urine culture significant for E. Coli with final results pending.  Assessment and Plan: Community-acquired pneumonia Confirmed on CT chest.  Evidence of bilateral lower lobe airspace consolidation, left greater than right.  Patient started empirically on ceftriaxone and azithromycin.  Blood cultures obtained on admission.  Urine strep pneumonia and Legionella ordered but not  collected. Patient transitioned to Cefdinir and azithromycin PO on discharge.   Acute respiratory failure with hypoxia Patient is not on oxygen as an outpatient.  Patient with SPO2 down to 83% with evidence of tachypnea as well.  Patient initially started on 3 L/min via nasal cannula.  Secondary to pneumonia. Weaned to room air. Resolved.   Right-sided headache Initial concern for temporal arteritis given elevated ESR and CRP.  Neurology consulted and recommended imaging.  MRI and MRV significant for no acute process.  Headache resolved without treatment for possible temporal arteritis.  Currently resolved.   UTI/acute pyelonephritis Diagnosed on admission.  Patient with urinalysis concerning for possible infection.  CT abdomen/pelvis concerning for evidence of possible left kidney infection.  Patient is on antibiotics for treatment of community-acquired pneumonia.  Urine culture was obtained and is significant for E. Coli. Cefdinir as mentioned above.  Generalized abdominal pain Unclear etiology but appears to be now resolved.  Patient received CT abdomen and pelvis which was significant for no acute process.  Currently resolved.   Hyponatremia Sodium down to 130 on admission.  Complicated by hydrochlorothiazide usage as an outpatient.  Patient started on IV fluids with movement.   CKD stage IIIa Baseline creatinine appears to be around 1.2.  Creatinine up to 1.44 back to 1.13.  Patient is on losartan-hydrochlorothiazide as an outpatient. Creatinine stable.   Primary hypertension Blood pressure currently stable.  Patient is on losartan-hydrochlorothiazide as an outpatient which were held on admission.  Blood pressure soft to normotensive this admission. Improved prior to discharge. Resume home regimen.   Hyperlipidemia Patient is on pravastatin as an outpatient.   Diabetes mellitus, type II Uncontrolled with hyperglycemia.  Patient is on glipizide as an outpatient which was held on  admission.  Started on sign scale insulin for inpatient management.   Consultants: Neurology Procedures performed: None  Disposition: Home Diet recommendation: Cardiac/carb diet  DISCHARGE MEDICATION: Allergies as of 08/24/2021       Reactions   Latex Other (See Comments)   unknown   Norvasc [amlodipine] Swelling        Medication List     STOP taking these medications    traZODone 50 MG tablet Commonly known as: DESYREL       TAKE these medications    Accu-Chek FastClix Lancets Misc Check blood sugars 1-2 times per day.   Accu-Chek Guide test strip Generic drug: glucose blood TEST FOUR TIMES DAILY   acetaminophen 500 MG tablet Commonly known as: TYLENOL Take 1,000 mg by mouth 2 (two) times daily as needed for headache (pain).   albuterol 108 (90 Base) MCG/ACT inhaler Commonly known as: VENTOLIN HFA TAKE 2 PUFFS BY MOUTH EVERY 6 HOURS AS NEEDED FOR WHEEZE OR SHORTNESS OF BREATH   aspirin EC 81 MG tablet Take 81 mg by mouth daily. Swallow whole.   azithromycin 500 MG tablet Commonly known as: Zithromax Take 1 tablet (500 mg total) by mouth daily for 3 days.   B-12 PO Take 1 tablet by mouth daily.   cefdinir 300 MG capsule Commonly known as: OMNICEF Take 1 capsule (300 mg total) by mouth 2 (two) times daily for 5 days.   fluticasone 50 MCG/ACT nasal spray Commonly known as: FLONASE SPRAY 1 SPRAY INTO EACH NOSTRIL TWICE A DAY AS NEEDED FOR ALLERGIES OR RHINITIS What changed: See the new instructions.   glipiZIDE 5 MG 24 hr tablet Commonly known as: GLUCOTROL XL TAKE 1 TABLET BY MOUTH EVERY DAY WITH BREAKFAST What changed: See the new instructions.   losartan-hydrochlorothiazide 50-12.5 MG tablet Commonly known as: HYZAAR TAKE 1 TABLET BY MOUTH EVERY DAY   meclizine 25 MG tablet Commonly known as: ANTIVERT TAKE 1 TABLET BY MOUTH 3 TIMES A DAY AS NEEDED FOR DIZZINESS   multivitamin with minerals Tabs tablet Take 1 tablet by mouth daily.    phentermine 37.5 MG tablet Commonly known as: ADIPEX-P Take 37.5 mg by mouth daily as needed (increased eating).   pravastatin 40 MG tablet Commonly known as: PRAVACHOL TAKE 1 TABLET BY MOUTH EVERY DAY        Follow-up Information     Jeanie Sewer, NP. Schedule an appointment as soon as possible for a visit in 1 week(s).   Specialty: Family Medicine Why: For hospital follow-up Contact information: [place] [address] [phone]                Discharge Exam: BP (!) 151/65 (BP Location: Left Arm)   Pulse 78   Temp 98.4 F (36.9 C) (Oral)   Resp (!) 23   Ht $R'4\' 11"'DD$  (1.499 m)   Wt 69.3 kg   LMP  (LMP Unknown)   SpO2 94%   BMI 30.86 kg/m   General exam: Appears calm and comfortable Respiratory system: Clear to auscultation. Respiratory effort normal. Cardiovascular system: S1 & S2 heard, RRR. No murmurs, rubs, gallops or clicks. Gastrointestinal system: Abdomen is nondistended, soft and nontender. Normal bowel sounds heard. Central nervous system: Alert and oriented. No focal neurological deficits. Musculoskeletal: No edema. No calf tenderness Skin: No cyanosis. No rashes Psychiatry: Judgement and insight appear normal. Mood & affect appropriate.   Condition at discharge: stable  The results of significant diagnostics from this hospitalization (including imaging, microbiology, ancillary  and laboratory) are listed below for reference.   Imaging Studies: CT CHEST ABDOMEN PELVIS W CONTRAST  Result Date: 08/23/2021 CLINICAL DATA:  Abdominal pain. Hypertension, hyperlipidemia and diabetes. EXAM: CT CHEST, ABDOMEN, AND PELVIS WITH CONTRAST TECHNIQUE: Multidetector CT imaging of the chest, abdomen and pelvis was performed following the standard protocol during bolus administration of intravenous contrast. RADIATION DOSE REDUCTION: This exam was performed according to the departmental dose-optimization program which includes automated  exposure control, adjustment of the mA and/or kV according to patient size and/or use of iterative reconstruction technique. CONTRAST:  21m OMNIPAQUE IOHEXOL 300 MG/ML  SOLN COMPARISON:  CT chest, abdomen and pelvis from 06/20/2019 FINDINGS: CT CHEST FINDINGS Cardiovascular: Heart size appears normal. Aortic atherosclerosis and coronary artery calcifications. No pericardial effusion identified. Mediastinum/Nodes: Thyroid gland, trachea and esophagus are unremarkable. No enlarged axillary, supraclavicular, mediastinal, or hilar lymph nodes. Lungs/Pleura: No pleural effusion. Mild cylindrical bronchiectasis and bronchiolectasis identified within the right lower lobe. Bilateral lower lobe airspace consolidation is identified, left greater than right. Imaging findings compatible with pneumonia and or aspiration. Mild subpleural consolidation within the posterolateral right upper lobe is also noted. No signs of interstitial edema or pneumothorax. Musculoskeletal: Age-indeterminate fracture involves the lateral aspect of the right tenth rib. The remaining osseous structures appear intact. CT ABDOMEN PELVIS FINDINGS Hepatobiliary: No suspicious liver abnormality. Gallbladder appears normal. No bile duct dilatation. Pancreas: Unremarkable. No pancreatic ductal dilatation or surrounding inflammatory changes. Spleen: Normal in size without focal abnormality. Adrenals/Urinary Tract: Normal adrenal glands. Left-sided pelvocaliectasis with mild haziness within the left renal sinus fat. No obstructing stone identified lung the course of either ureter. No kidney mass noted. Bladder appears within normal limits. Stomach/Bowel: Tiny hiatal hernia. The appendix is visualized and appears normal no signs of bowel wall thickening, inflammation or distension. Vascular/Lymphatic: Aortic atherosclerosis. No aneurysm. No signs of abdominopelvic adenopathy. Reproductive: Uterus appears surgically absent. No adnexal mass identified. Other:  No free fluid or fluid collections identified. Small fat containing umbilical hernia. No signs of pneumoperitoneum. Musculoskeletal: Previous left hip arthroplasty. First degree anterolisthesis and degenerative disc disease noted at L4-5. No acute or suspicious osseous findings. Mild curvature of the thoracolumbar spine is convex towards the left. IMPRESSION: 1. Bilateral lower lobe airspace consolidation, left greater than right. Imaging findings compatible with pneumonia and/or aspiration. 2. Age-indeterminate fracture involves the lateral aspect of the right tenth rib. 3. Left-sided pelvocaliectasis with mild haziness within the left renal sinus fat. No obstructing stone identified. Correlate for any clinical signs or symptoms of urinary tract infection. 4. Aortic Atherosclerosis (ICD10-I70.0). 5. Coronary artery calcifications. Electronically Signed   By: TKerby MoorsM.D.   On: 08/23/2021 05:56   MR BRAIN WO CONTRAST  Result Date: 08/23/2021 CLINICAL DATA:  Headache, cluster/trigeminal. EXAM: MRI HEAD WITHOUT CONTRAST MRV HEAD WITHOUT CONTRAST TECHNIQUE: Multiplanar, multi-echo pulse sequences of the brain and surrounding structures were acquired without intravenous contrast. Angiographic images of the intracranial veins were acquired using MRV technique without intravenous contrast. COMPARISON:  03/01/2019 head CT FINDINGS: MRI HEAD FINDINGS Brain: No acute infarction, hemorrhage, hydrocephalus, extra-axial collection or mass lesion. Mild chronic small vessel ischemia in the cerebral white matter and pons. Brain volume is normal. Vascular: Arterial findings described below.  Preserved flow voids. Skull and upper cervical spine: No focal marrow lesion. Sinuses/Orbits: Clear sinuses. Bilateral cataract resection and right scleral buckle. MRV HEAD FINDINGS Dominant right transverse sigmoid drainage. No dural sinus thrombosis. Occasional filling defect from arachnoid granulation especially at the transverse  sigmoid junctions. No  evidence of deep vein or cortical vein thrombosis. IMPRESSION: Brain MRI: 1. No acute finding or specific cause for headache. 2. Mild chronic small vessel ischemia. Intracranial MRV: Negative for dural sinus thrombosis. Electronically Signed   By: Jorje Guild M.D.   On: 08/23/2021 05:10   MR MRV HEAD WO CM  Result Date: 08/23/2021 CLINICAL DATA:  Headache, cluster/trigeminal. EXAM: MRI HEAD WITHOUT CONTRAST MRV HEAD WITHOUT CONTRAST TECHNIQUE: Multiplanar, multi-echo pulse sequences of the brain and surrounding structures were acquired without intravenous contrast. Angiographic images of the intracranial veins were acquired using MRV technique without intravenous contrast. COMPARISON:  03/01/2019 head CT FINDINGS: MRI HEAD FINDINGS Brain: No acute infarction, hemorrhage, hydrocephalus, extra-axial collection or mass lesion. Mild chronic small vessel ischemia in the cerebral white matter and pons. Brain volume is normal. Vascular: Arterial findings described below.  Preserved flow voids. Skull and upper cervical spine: No focal marrow lesion. Sinuses/Orbits: Clear sinuses. Bilateral cataract resection and right scleral buckle. MRV HEAD FINDINGS Dominant right transverse sigmoid drainage. No dural sinus thrombosis. Occasional filling defect from arachnoid granulation especially at the transverse sigmoid junctions. No evidence of deep vein or cortical vein thrombosis. IMPRESSION: Brain MRI: 1. No acute finding or specific cause for headache. 2. Mild chronic small vessel ischemia. Intracranial MRV: Negative for dural sinus thrombosis. Electronically Signed   By: Jorje Guild M.D.   On: 08/23/2021 05:10   DG Chest Port 1 View  Result Date: 08/22/2021 CLINICAL DATA:  Headache and difficulty breathing x4 days. EXAM: PORTABLE CHEST 1 VIEW COMPARISON:  November 26, 2019 FINDINGS: The heart size and mediastinal contours are within normal limits. There is moderate severity calcification of  the thoracic aorta. Mild atelectasis and/or infiltrate is seen within the left lung base. There is no evidence of a pleural effusion or pneumothorax. Multilevel degenerative changes seen throughout the thoracic spine. IMPRESSION: Mild left basilar atelectasis and/or infiltrate. Electronically Signed   By: Virgina Norfolk M.D.   On: 08/22/2021 15:52   US Abdomen Limited RUQ (LIVER/GB)  Result Date: 07/30/2021 CLINICAL DATA:  Back pain which radiates to RIGHT side. EXAM: ULTRASOUND ABDOMEN LIMITED RIGHT UPPER QUADRANT COMPARISON:  None Available. FINDINGS: Gallbladder: No gallstones or wall thickening visualized. No sonographic Murphy sign noted by sonographer. Common bile duct: Diameter: Normal at 3 mm Liver: No focal lesion identified. Uniform increase in hepatic echogenicity. Portal vein is patent on color Doppler imaging with normal direction of blood flow towards the liver. Other: None. IMPRESSION: 1. Normal gallbladder and biliary tree. 2. Mild increase in hepatic echogenicity may represent hepatic steatosis. Electronically Signed   By: Suzy Bouchard M.D.   On: 07/30/2021 15:05    Microbiology: Results for orders placed or performed during the hospital encounter of 08/22/21  SARS Coronavirus 2 by RT PCR (hospital order, performed in [facility] hospital lab) *cepheid single result test* Anterior Nasal Swab     Status: None   Collection Time: 08/22/21  3:40 PM   Specimen: Anterior Nasal Swab  Result Value Ref Range Status   SARS Coronavirus 2 by RT PCR NEGATIVE NEGATIVE Final    Comment: (NOTE) SARS-CoV-2 target nucleic acids are NOT DETECTED.  The SARS-CoV-2 RNA is generally detectable in upper and lower respiratory specimens during the acute phase of infection. The lowest concentration of SARS-CoV-2 viral copies this assay can detect is 250 copies / mL. A negative result does not preclude SARS-CoV-2 infection and should not be used as the sole basis for treatment or other patient  management  decisions.  A negative result may occur with improper specimen collection / handling, submission of specimen other than nasopharyngeal swab, presence of viral mutation(s) within the areas targeted by this assay, and inadequate number of viral copies (<250 copies / mL). A negative result must be combined with clinical observations, patient history, and epidemiological information.  Fact Sheet for Patients:   https://www.patel.info/  Fact Sheet for Healthcare Providers: https://hall.com/  This test is not yet approved or  cleared by the [place] FDA and has been authorized for detection and/or diagnosis of SARS-CoV-2 by FDA under an Emergency Use Authorization (EUA).  This EUA will remain in effect (meaning this test can be used) for the duration of the COVID-19 declaration under Section 564(b)(1) of the Act, 21 U.S.C. section 360bbb-3(b)(1), unless the authorization is terminated or revoked sooner.  Performed at [facility], [address], [place], [place] [address]   Urine Culture     Status: Abnormal (Preliminary result)   Collection Time: 08/22/21  5:15 PM   Specimen: Urine, Clean Catch  Result Value Ref Range Status   Specimen Description   Final    URINE, CLEAN CATCH Performed at [facility], [address], [place], [place] [address]    Special Requests   Final    NONE Performed at [facility], [address] [place], [place] [address]    Culture >=100,000 COLONIES/mL ESCHERICHIA COLI (A)  Final   Report Status PENDING  Incomplete  Culture, blood (routine x 2)     Status: None (Preliminary result)   Collection Time: 08/22/21  5:40 PM   Specimen: BLOOD  Result Value Ref Range Status   Specimen Description   Final    BLOOD RIGHT ARM Performed at [facility], [address] [place], [place] [address]    Special Requests    Final    BOTTLES DRAWN AEROBIC AND ANAEROBIC Blood Culture adequate volume Performed at [facility], [address] [place], [place] [address]    Culture   Final    NO GROWTH 2 DAYS Performed at [facility], [place] [address], [place], [place] [address]    Report Status PENDING  Incomplete  Culture, blood (routine x 2)     Status: None (Preliminary result)   Collection Time: 08/22/21  6:00 PM   Specimen: BLOOD  Result Value Ref Range Status   Specimen Description   Final    BLOOD BLOOD LEFT ARM Performed at [facility], [address] [place], [place] [address]    Special Requests   Final    Blood Culture adequate volume BOTTLES DRAWN AEROBIC AND ANAEROBIC Performed at [facility], [address], [place], [place] [address]    Culture   Final    NO GROWTH 2 DAYS Performed at [facility], [place] [address] [place], [place] [address]    Report Status PENDING  Incomplete  MRSA Next Gen by PCR, Nasal     Status: None   Collection Time: 08/22/21 11:02 PM   Specimen: Nasal Mucosa; Nasal Swab  Result Value Ref Range Status   MRSA by PCR Next Gen NOT DETECTED NOT DETECTED Final    Comment: (NOTE) The GeneXpert MRSA Assay (FDA approved for NASAL specimens only), is one component of a comprehensive MRSA colonization surveillance program. It is not intended to diagnose MRSA infection nor to guide or monitor treatment for MRSA infections. Test performance is not FDA approved in patients less than  80 years old. Performed at [facility], [place] [address], [place], [place] [address]   Culture, blood (Routine X 2) w Reflex to ID Panel     Status: None (Preliminary result)   Collection Time: 08/23/21  2:59 AM   Specimen: BLOOD  Result Value Ref Range Status   Specimen Description BLOOD LEFT ANTECUBITAL  Final   Special Requests   Final    BOTTLES DRAWN AEROBIC AND ANAEROBIC Blood Culture adequate volume    Culture   Final    NO GROWTH 1 DAY Performed at [facility], [place] [address] [place], [place] [address]    Report Status PENDING  Incomplete  Culture, blood (Routine X 2) w Reflex to ID Panel     Status: None (Preliminary result)   Collection Time: 08/23/21  3:08 AM   Specimen: BLOOD RIGHT HAND  Result Value Ref Range Status   Specimen Description BLOOD RIGHT HAND  Final   Special Requests   Final    BOTTLES DRAWN AEROBIC AND ANAEROBIC Blood Culture adequate volume   Culture   Final    NO GROWTH 1 DAY Performed at [facility], [place] [address] [place], [place] [address]    Report Status PENDING  Incomplete    Labs: CBC: Recent Labs  Lab 08/22/21 1540  WBC 9.0  NEUTROABS 6.5  HGB 13.7  HCT 41.0  MCV 93.0  PLT 914   Basic Metabolic Panel: Recent Labs  Lab 08/22/21 1540 08/23/21 0012 08/23/21 1420  NA 130* 133*  --   K 3.7 3.6  --   CL 92* 100  --   CO2 24 23  --   GLUCOSE 217* 154*  --   BUN 20 23  --   CREATININE 1.13* 1.44* 1.13*  CALCIUM 9.8 8.0*  --    Liver Function Tests: Recent Labs  Lab 08/22/21 1540 08/23/21 0012  AST 18 18  ALT 20 19  ALKPHOS 102 86  BILITOT 1.7* 1.2  PROT 8.0 6.1*  ALBUMIN 4.2 2.8*   CBG: Recent Labs  Lab 08/23/21 1138 08/23/21 1528 08/23/21 2049 08/24/21 0642 08/24/21 1156  GLUCAP 173* 150* 241* 156* 133*    Discharge time spent: 35 minutes.  Signed: Cordelia Poche, MD [facility] 08/24/2021

## 2021-08-24 NOTE — Plan of Care (Signed)
  Problem: Acute Rehab PT Goals(only PT should resolve) Goal: Patient Will Transfer Sit To/From Stand Outcome: Adequate for Discharge Goal: Pt Will Ambulate Outcome: Adequate for Discharge Goal: PT Additional Goal #1 Outcome: Adequate for Discharge

## 2021-08-24 NOTE — Progress Notes (Addendum)
RN called to room patient stating 10/10 pain to left side of head. Sharp pain that takes breath away. MD paged. See new orders

## 2021-08-24 NOTE — Plan of Care (Signed)
  Problem: Education: Goal: Knowledge of General Education information will improve Description: Including pain rating scale, medication(s)/side effects and non-pharmacologic comfort measures Outcome: Progressing   Problem: Health Behavior/Discharge Planning: Goal: Ability to manage health-related needs will improve Outcome: Progressing   Problem: Clinical Measurements: Goal: Ability to maintain clinical measurements within normal limits will improve Outcome: Progressing Goal: Diagnostic test results will improve Outcome: Progressing   Problem: Nutrition: Goal: Adequate nutrition will be maintained Outcome: Progressing   Problem: Elimination: Goal: Will not experience complications related to urinary retention Outcome: Progressing   Problem: Safety: Goal: Ability to remain free from injury will improve Outcome: Progressing

## 2021-08-24 NOTE — Evaluation (Addendum)
Physical Therapy Evaluation Patient Details Name: Misty Cervantes MRN: [id number] DOB: [birth year] Today's Date: 08/24/2021  History of Present Illness  Patient is a 76 y/o female who presents on 6/10 with weakness, non-productive cough, fatigue, HA and SOB. Admitted with acute respiratory failure with hypoxia and found to have CAP and UTI. Brain MRI-unremarkable. PMH includes Bil TKA, CKD, Covid-19, DM, HTN.  Clinical Impression  Patient presents with generalized weakness and impaired balance s/p above. Pt lives at home with her son and grandchildren and reports being independent for ADLs and walking PTA. Reports no falls. Today, pt tolerated transfers and ambulation with close Min guard due to mild unsteadiness. SP02 and HR stable on RA throughout session with no SOB noted. BP pre activity 141/68, post activity BP 106/92, asymptomatic. Anticipate, with increased activity, pt's strength and mobility will improve. Encouraged using RW at home if needed. Will follow acutely to maximize independence and mobility prior to return home.Will consult mobility team to see patient as well.       Recommendations for follow up therapy are one component of a multi-disciplinary discharge planning process, led by the attending physician.  Recommendations may be updated based on patient status, additional functional criteria and insurance authorization.  Follow Up Recommendations No PT follow up    Assistance Recommended at Discharge PRN  Patient can return home with the following  Assistance with cooking/housework;A little help with bathing/dressing/bathroom    Equipment Recommendations None recommended by PT  Recommendations for Other Services       Functional Status Assessment Patient has had a recent decline in their functional status and demonstrates the ability to make significant improvements in function in a reasonable and predictable amount of time.     Precautions / Restrictions  Precautions Precautions: Fall Restrictions Weight Bearing Restrictions: No      Mobility  Bed Mobility Overal bed mobility: Modified Independent             General bed mobility comments: HOB mostly flat, no assist needed.    Transfers Overall transfer level: Needs assistance Equipment used: None Transfers: Sit to/from Stand Sit to Stand: Min guard           General transfer comment: Min guard for safety. Stood from [employer].    Ambulation/Gait Ambulation/Gait assistance: Min guard Gait Distance (Feet): 200 Feet Assistive device: None Gait Pattern/deviations: Step-through pattern, Decreased stride length, Drifts right/left Gait velocity: decreased     General Gait Details: Slow, mildly unsteady gait with mild drifting noted, no overt LOB. No SOB noted. VSS on RA throughout.  Stairs            Wheelchair Mobility    Modified Rankin (Stroke Patients Only)       Balance Overall balance assessment: Mild deficits observed, not formally tested                                           Pertinent Vitals/Pain Pain Assessment Pain Assessment: No/denies pain    Home Living Family/patient expects to be discharged to:: Private residence Living Arrangements:  (son and grandchildren) Available Help at Discharge: Family;Available PRN/intermittently Type of Home: Apartment Home Access: Level entry       Home Layout: One level Home Equipment: Rolling Walker (2 wheels);BSC/3in1;Cane - single point Additional Comments: Recently moved into a new 1 level apt 2 weeks ago    Prior Function  Prior Level of Function : Independent/Modified Independent             Mobility Comments: Independent, drives. No falls reported. Does not do IADls. ADLs Comments: Independent     Hand Dominance   Dominant Hand: Right    Extremity/Trunk Assessment   Upper Extremity Assessment Upper Extremity Assessment: Defer to OT evaluation    Lower  Extremity Assessment Lower Extremity Assessment: Generalized weakness (but functional)       Communication   Communication: No difficulties  Cognition Arousal/Alertness: Awake/alert Behavior During Therapy: WFL for tasks assessed/performed Overall Cognitive Status: Within Functional Limits for tasks assessed                                          General Comments General comments (skin integrity, edema, etc.): BP pre activity 141/68, post activity BP 106/92, asymptomatic.    Exercises     Assessment/Plan    PT Assessment Patient needs continued PT services  PT Problem List Decreased strength;Decreased mobility;Decreased balance       PT Treatment Interventions Therapeutic activities;Gait training;Balance training;Therapeutic exercise;Patient/family education    PT Goals (Current goals can be found in the Care Plan section)  Acute Rehab PT Goals Patient Stated Goal: to get better and go home, unpack PT Goal Formulation: With patient Time For Goal Achievement: 09/07/21 Potential to Achieve Goals: Good    Frequency Min 3X/week     Co-evaluation               AM-PAC PT "6 Clicks" Mobility  Outcome Measure Help needed turning from your back to your side while in a flat bed without using bedrails?: None Help needed moving from lying on your back to sitting on the side of a flat bed without using bedrails?: None Help needed moving to and from a bed to a chair (including a wheelchair)?: A Little Help needed standing up from a chair using your arms (e.g., wheelchair or bedside chair)?: A Little Help needed to walk in hospital room?: A Little Help needed climbing 3-5 steps with a railing? : A Little 6 Click Score: 20    End of Session Equipment Utilized During Treatment: Gait belt Activity Tolerance: Patient tolerated treatment well Patient left: in bed;with call bell/phone within reach;with family/visitor present Nurse Communication: Mobility  status PT Visit Diagnosis: Unsteadiness on feet (R26.81);Muscle weakness (generalized) (M62.81)    Time: 9381-0175 PT Time Calculation (min) (ACUTE ONLY): 16 min   Charges:   PT Evaluation $PT Eval Moderate Complexity: 1 Mod          Marisa Severin, PT, DPT Acute Rehabilitation Services Secure chat preferred Office [phone]     Misty Cervantes 08/24/2021, 1:20 PM

## 2021-08-24 NOTE — Discharge Instructions (Signed)
Barron Alvine,  You were here with a headache and found to have a pneumonia and UTI/kidney infection. You have been started on antibiotics. Please continue on discharge. Please follow-up with your PCP.

## 2021-08-25 ENCOUNTER — Telehealth: Payer: Self-pay

## 2021-08-25 LAB — URINE CULTURE: Culture: >=100000 — AB

## 2021-08-25 NOTE — Telephone Encounter (Signed)
Transition Care Management Unsuccessful Follow-up Telephone Call  Date of discharge and from where:  [facility] 08/24/21  Attempts:  1st Attempt  Reason for unsuccessful TCM follow-up call:  Left voice message

## 2021-08-26 ENCOUNTER — Telehealth: Payer: Self-pay

## 2021-08-26 NOTE — Telephone Encounter (Signed)
Transition Care Management Follow-up Telephone Call Date of discharge and from where: [facility] 08/24/21 How have you been since you were released from the hospital? Still don't feel to well Any questions or concerns? No  Items Reviewed: Did the pt receive and understand the discharge instructions provided? Yes  Medications obtained and verified? Yes  Other? No  Any new allergies since your discharge? No  Dietary orders reviewed? Yes Do you have support at home? Yes   Home Care and Equipment/Supplies: Were home health services ordered? not applicable If so, what is the name of the agency?   Has the agency set up a time to come to the patient's home? not applicable Were any new equipment or medical supplies ordered?  No What is the name of the medical supply agency?  Were you able to get the supplies/equipment? not applicable Do you have any questions related to the use of the equipment or supplies? No  Functional Questionnaire: (I = Independent and D = Dependent) ADLs: I  Bathing/Dressing- I  Meal Prep- I  Eating- I  Maintaining continence- I  Transferring/Ambulation- I  Managing Meds- I  Follow up appointments reviewed:  PCP Hospital f/u appt confirmed? No   Pt stated she would make appt at later [facility] f/u appt confirmed? No   Are transportation arrangements needed? No  If their condition worsens, is the pt aware to call PCP or go to the Emergency Dept.? Yes Was the patient provided with contact information for the PCP's office or ED? Yes Was to pt encouraged to call back with questions or concerns? Yes

## 2021-08-27 ENCOUNTER — Telehealth: Payer: Medicare Other

## 2021-08-27 ENCOUNTER — Telehealth: Payer: Self-pay | Admitting: Licensed Clinical Social Worker

## 2021-08-27 LAB — CULTURE, BLOOD (ROUTINE X 2)
Culture: NO GROWTH
Culture: NO GROWTH
Special Requests: ADEQUATE
Special Requests: ADEQUATE

## 2021-08-27 NOTE — Chronic Care Management (AMB) (Signed)
    Clinical Social Work  Care Management   Phone Outreach    08/27/2021 Name: Misty Cervantes MRN: [id number] DOB: 04/03/1945  Misty Cervantes is a 76 y.o. year old female who is a primary care patient of Jeanie Sewer, NP .   Reason for referral: Grief Counseling.    CCM LCSW reached out to patient today by phone to introduce self, assess needs and offer Care Management services and interventions.    Telephone outreach was unsuccessful.  Plan:CCM LCSW will wait for return call. If no return call is received, Will route chart to Care Guide to see if patient would like to reschedule phone appointment   Review of patient status, including review of consultants reports, relevant laboratory and other test results, and collaboration with appropriate care team members and the patient's provider was performed as part of comprehensive patient evaluation and provision of care management services.     Casimer Lanius, LCSW Licensed Clinical Social Worker [facility]  [facility] [place] [phone]

## 2021-08-28 LAB — CULTURE, BLOOD (ROUTINE X 2)
Culture: NO GROWTH
Culture: NO GROWTH
Special Requests: ADEQUATE
Special Requests: ADEQUATE

## 2021-08-31 NOTE — Progress Notes (Deleted)
Subjective:     Patient ID: Misty Cervantes, female    DOB: February 21, 1946, 76 y.o.   MRN: [id number]  No chief complaint on file.   HPI:  Assessment & Plan:   Problem List Items Addressed This Visit       Respiratory   CAP (community acquired pneumonia)     Genitourinary   UTI (urinary tract infection)   Other Visit Diagnoses     Hospital discharge follow-up    -  Primary       Outpatient Medications Prior to Visit  Medication Sig Dispense Refill   Accu-Chek FastClix Lancets MISC Check blood sugars 1-2 times per day. 100 each 2   acetaminophen (TYLENOL) 500 MG tablet Take 1,000 mg by mouth 2 (two) times daily as needed for headache (pain).     aspirin EC 81 MG tablet Take 81 mg by mouth daily. Swallow whole.     Cyanocobalamin (B-12 PO) Take 1 tablet by mouth daily.     fluticasone (FLONASE) 50 MCG/ACT nasal spray SPRAY 1 SPRAY INTO EACH NOSTRIL TWICE A DAY AS NEEDED FOR ALLERGIES OR RHINITIS (Patient taking differently: Place 1 spray into both nostrils 2 (two) times daily as needed for allergies or rhinitis.) 48 mL 0   glipiZIDE (GLUCOTROL XL) 5 MG 24 hr tablet TAKE 1 TABLET BY MOUTH EVERY DAY WITH BREAKFAST (Patient taking differently: Take 5 mg by mouth daily.) 90 tablet 1   glucose blood (ACCU-CHEK GUIDE) test strip TEST FOUR TIMES DAILY 100 strip 0   losartan-hydrochlorothiazide (HYZAAR) 50-12.5 MG tablet TAKE 1 TABLET BY MOUTH EVERY DAY (Patient taking differently: Take 1 tablet by mouth daily.) 90 tablet 1   meclizine (ANTIVERT) 25 MG tablet TAKE 1 TABLET BY MOUTH 3 TIMES A DAY AS NEEDED FOR DIZZINESS (Patient not taking: Reported on 08/23/2021) 60 tablet 1   Multiple Vitamin (MULTIVITAMIN WITH MINERALS) TABS tablet Take 1 tablet by mouth daily.     phentermine (ADIPEX-P) 37.5 MG tablet Take 37.5 mg by mouth daily as needed (increased eating).     pravastatin (PRAVACHOL) 40 MG tablet TAKE 1 TABLET BY MOUTH EVERY DAY (Patient taking differently: Take 40 mg by mouth  daily.) 90 tablet 1   albuterol (VENTOLIN HFA) 108 (90 Base) MCG/ACT inhaler TAKE 2 PUFFS BY MOUTH EVERY 6 HOURS AS NEEDED FOR WHEEZE OR SHORTNESS OF BREATH (Patient not taking: Reported on 08/23/2021) 6.7 each 1   No facility-administered medications prior to visit.    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    gyn--- dr Talbert Nan, using pessary   Wears glasses     Past Surgical History:  Procedure Laterality Date   CARPAL TUNNEL RELEASE Right    1980s   CATARACT EXTRACTION W/ INTRAOCULAR LENS IMPLANT Bilateral 2013   approx   COLONOSCOPY WITH ESOPHAGOGASTRODUODENOSCOPY (EGD)  11/17/2011  COLPOCLEISIS N/A 09/02/2020   Procedure: COLPOCLEISIS, PERINEORRHAPHY;  Surgeon: Salvadore Dom, MD;  Location[facility]  Service: Gynecology;  Laterality: N/A;   CYSTOSCOPY N/A 09/02/2020   Procedure: CYSTOSCOPY;  Surgeon: Salvadore Dom, MD;  Location: [facility];  Service: Gynecology;  Laterality: N/A;   RETINAL DETACHMENT SURGERY Right 2003   TOTAL HIP ARTHROPLASTY Left 03/02/2010   '@[facility]    TOTAL KNEE ARTHROPLASTY Right 11/03/2015   Procedure: RIGHT TOTAL KNEE ARTHROPLASTY;  Surgeon: Gaynelle Arabian, MD;  Location: [facility];  Service: Orthopedics;  Laterality: Right;   TOTAL VAGINAL HYSTERECTOMY  2012   TVH, anterior repair, TVT (no mesh used for the anterior repair)    Allergies  Allergen Reactions   Latex  Other (See Comments)    unknown   Norvasc [Amlodipine] Swelling       Objective:    Physical Exam Vitals and nursing note reviewed.  Constitutional:      Appearance: Normal appearance. She is obese.  Cardiovascular:     Rate and Rhythm: Normal rate and regular rhythm.  Pulmonary:     Effort: Pulmonary effort is normal.     Breath sounds: Normal breath sounds.  Musculoskeletal:        General: Normal range of motion.  Skin:    General: Skin is warm and dry.  Neurological:     Mental Status: She is alert.  Psychiatric:        Mood and Affect: Mood normal.        Behavior: Behavior normal.     LMP  (LMP Unknown)  Wt Readings from Last 3 Encounters:  08/22/21 152 lb 12.5 oz (69.3 kg)  07/30/21 151 lb (68.5 kg)  07/16/21 157 lb 9.6 oz (71.5 kg)        No orders of the defined types were placed in this encounter.   Jeanie Sewer, NP

## 2021-09-01 ENCOUNTER — Encounter: Payer: Self-pay | Admitting: Family

## 2021-09-01 ENCOUNTER — Ambulatory Visit (INDEPENDENT_AMBULATORY_CARE_PROVIDER_SITE_OTHER): Payer: Medicare Other | Admitting: Family

## 2021-09-01 ENCOUNTER — Ambulatory Visit: Payer: Medicare Other | Admitting: Family

## 2021-09-01 VITALS — BP 107/69 | HR 87 | Temp 98.3°F | Ht 59.0 in | Wt 149.4 lb

## 2021-09-01 DIAGNOSIS — J189 Pneumonia, unspecified organism: Secondary | ICD-10-CM

## 2021-09-01 DIAGNOSIS — N3000 Acute cystitis without hematuria: Secondary | ICD-10-CM

## 2021-09-01 DIAGNOSIS — E1165 Type 2 diabetes mellitus with hyperglycemia: Secondary | ICD-10-CM | POA: Diagnosis not present

## 2021-09-01 DIAGNOSIS — I10 Essential (primary) hypertension: Secondary | ICD-10-CM

## 2021-09-01 DIAGNOSIS — N898 Other specified noninflammatory disorders of vagina: Secondary | ICD-10-CM

## 2021-09-01 DIAGNOSIS — Z09 Encounter for follow-up examination after completed treatment for conditions other than malignant neoplasm: Secondary | ICD-10-CM

## 2021-09-01 DIAGNOSIS — E1159 Type 2 diabetes mellitus with other circulatory complications: Secondary | ICD-10-CM | POA: Diagnosis not present

## 2021-09-01 DIAGNOSIS — E785 Hyperlipidemia, unspecified: Secondary | ICD-10-CM

## 2021-09-01 DIAGNOSIS — E1169 Type 2 diabetes mellitus with other specified complication: Secondary | ICD-10-CM | POA: Diagnosis not present

## 2021-09-01 LAB — POCT URINALYSIS DIPSTICK
Bilirubin, UA: NEGATIVE
Blood, UA: NEGATIVE
Glucose, UA: POSITIVE — AB
Ketones, UA: NEGATIVE
Nitrite, UA: NEGATIVE
Protein, UA: POSITIVE — AB
Spec Grav, UA: 1.025 (ref 1.010–1.025)
Urobilinogen, UA: 0.2 E.U./dL
pH, UA: 5.5 (ref 5.0–8.0)

## 2021-09-01 MED ORDER — GLIPIZIDE ER 5 MG PO TB24: 5.0000 mg | ORAL_TABLET | Freq: Every day | ORAL | 1 refills | Status: DC

## 2021-09-01 MED ORDER — LOSARTAN POTASSIUM 50 MG PO TABS: 50.0000 mg | ORAL_TABLET | Freq: Every day | ORAL | 1 refills | Status: DC

## 2021-09-01 MED ORDER — PRAVASTATIN SODIUM 40 MG PO TABS: 40.0000 mg | ORAL_TABLET | Freq: Every day | ORAL | 1 refills | Status: DC

## 2021-09-01 NOTE — Assessment & Plan Note (Signed)
Chronic - stable on Pravastatin, recheck fasting labs next visit, f/u in 3 mos.

## 2021-09-01 NOTE — Patient Instructions (Signed)
It was very nice to see you today!  Go to the lab and leave a urine specimen.  I have sent refills for your Glipizide and Pravastatin, and Losartan (without the HCTZ-fluid part). Continue taking these daily.  It can take up to a month or 2 to recover from pneumonia, but each day should get better. Let me know if your cough is still not resolving or any fever returns.  Follow up in 3 months to recheck your diabetes numbers.       PLEASE NOTE:  If you had any lab tests please let us know if you have not heard back within a few days. You may see your results on MyChart before we have a chance to review them but we will give you a call once they are reviewed by [place]. If we ordered any referrals today, please let us know if you have not heard from their office within the next week.

## 2021-09-01 NOTE — Progress Notes (Signed)
Subjective:     Patient ID: Misty Cervantes, female    DOB: 09-29-1945, 76 y.o.   MRN: [id number]  Chief Complaint  Patient presents with   Follow-up    Pt was seen in ED on 6/10. Pt states she was fatigue , Has a cough nonproductive, tightness in chest, generalized abdominal pain, right-sided headache and lightheadedness. Pt states she does not feel any better but cough is better. Pt was given azithromycin and cefdinir which she has finished.    HPI: Follow up Hospitalization Patient was admitted to [facility] on 08/22/2021 and discharged on 08/24/2021. She was treated for CAP, UTI. Treatment for this included anbiotics. Telephone follow up was done on 6/13 She reports good compliance with treatment. She reports this condition is improved. Urinary symptoms: Patient c/o  dysuria, frequency, urgency. Other sx: vaginal d/c: no, vaginal itching: no. Duration of sx: 2 days; Home tx: finished Augmentin; Denies  nausea, fever. Reports last UTI 1 week ago while in hospital.  PNA:  pt reports still having a mild productive cough, slowly improving and also feeling very tired. Denies any SOB or fever, has finished Augmentin given at hospital discharge.  T2DM: Pt is currently maintained on the following medications for diabetes:Glipizide Failed meds include: none Denies polyuria/polydipsia/polyphagia Denies hypoglycemia Home glucose readings range: ran out strips Last A1C was  Lab Results  Component Value Date   HGBA1C 9.2 (H) 08/23/2021   HGBA1C 7.6 (H) 01/22/2021   HGBA1C 7.4 (H) 07/11/2020  Hyperlipidemia Patient is currently maintained on the following medication for hyperlipidemia: Pravastatin Patient denies myalgia. Patient reports good compliance with low fat/low cholesterol diet.  Last lipid panel as follows:  Lab Results  Component Value Date   CHOL 162 01/22/2021   HDL 42.90 01/22/2021   LDLCALC 96 01/22/2021   TRIG 117.0 01/22/2021   CHOLHDL 4 01/22/2021   Hypertension Patient is currently maintained on the following medications for blood pressure: Losartan Patient reports good compliance with blood pressure medications. Patient denies chest pain, shortness of breath, headache, dizziness, or swelling. Last 3 blood pressure readings in our office are as follows: BP Readings from Last 3 Encounters:  09/01/21 107/69  08/24/21 (!) 151/65  07/30/21 (!) 149/74    Assessment & Plan:   Problem List Items Addressed This Visit       Cardiovascular and Mediastinum   Hypertension associated with diabetes ([place])    Chronic - NA level low in hospital, will d/c HCTZ, continue Losartan, f/u in 3 mos.      Relevant Medications   losartan (COZAAR) 50 MG tablet   pravastatin (PRAVACHOL) 40 MG tablet   glipiZIDE (GLUCOTROL XL) 5 MG 24 hr tablet     Endocrine   Hyperlipidemia associated with type 2 diabetes mellitus (HCC)    Chronic - stable on Pravastatin, recheck fasting labs next visit, f/u in 3 mos.      Relevant Medications   losartan (COZAAR) 50 MG tablet   pravastatin (PRAVACHOL) 40 MG tablet   glipiZIDE (GLUCOTROL XL) 5 MG 24 hr tablet   Type 2 diabetes mellitus with hyperglycemia, without long-term current use of insulin (HCC)    Chronic, unstable - A1C checked during recent hospital stay, up to 9.2. Advised on taking the Glipizide daily. Pt reports her son dying in Dec and she has been very sad and stressed as she is caring for her grandchildren. Advised to continue Glipizide, and she has to monitor her blood sugar and let me know readings  in one week. May need to increase Glipizide dose.      Relevant Medications   losartan (COZAAR) 50 MG tablet   pravastatin (PRAVACHOL) 40 MG tablet   glipiZIDE (GLUCOTROL XL) 5 MG 24 hr tablet     Genitourinary   UTI (urinary tract infection) - Primary treated w/IV abt and oral Cefdinir, pt completed course, will recheck UA and culture.pt reports some dysuria yesterday.     Relevant Orders    POCT Urinalysis Dipstick (Completed)   Urine Culture   Other Visit Diagnoses     Hospital discharge follow-up  - 6/10-6/02/2022 for UTI and CAP. treated w/antibiotics, doing better, some mild fatigue & cough, dysuria yesterday, rechecking urine today.        Outpatient Medications Prior to Visit  Medication Sig Dispense Refill   Accu-Chek FastClix Lancets MISC Check blood sugars 1-2 times per day. 100 each 2   acetaminophen (TYLENOL) 500 MG tablet Take 1,000 mg by mouth 2 (two) times daily as needed for headache (pain).     aspirin EC 81 MG tablet Take 81 mg by mouth daily. Swallow whole.     Cyanocobalamin (B-12 PO) Take 1 tablet by mouth daily.     fluticasone (FLONASE) 50 MCG/ACT nasal spray SPRAY 1 SPRAY INTO EACH NOSTRIL TWICE A DAY AS NEEDED FOR ALLERGIES OR RHINITIS (Patient taking differently: Place 1 spray into both nostrils 2 (two) times daily as needed for allergies or rhinitis.) 48 mL 0   glucose blood (ACCU-CHEK GUIDE) test strip TEST FOUR TIMES DAILY 100 strip 0   meclizine (ANTIVERT) 25 MG tablet TAKE 1 TABLET BY MOUTH 3 TIMES A DAY AS NEEDED FOR DIZZINESS 60 tablet 1   Multiple Vitamin (MULTIVITAMIN WITH MINERALS) TABS tablet Take 1 tablet by mouth daily.     glipiZIDE (GLUCOTROL XL) 5 MG 24 hr tablet TAKE 1 TABLET BY MOUTH EVERY DAY WITH BREAKFAST (Patient taking differently: Take 5 mg by mouth daily.) 90 tablet 1   losartan-hydrochlorothiazide (HYZAAR) 50-12.5 MG tablet TAKE 1 TABLET BY MOUTH EVERY DAY (Patient taking differently: Take 1 tablet by mouth daily.) 90 tablet 1   phentermine (ADIPEX-P) 37.5 MG tablet Take 37.5 mg by mouth daily as needed (increased eating).     pravastatin (PRAVACHOL) 40 MG tablet TAKE 1 TABLET BY MOUTH EVERY DAY (Patient taking differently: Take 40 mg by mouth daily.) 90 tablet 1   No facility-administered medications prior to visit.    Past Medical History:  Diagnosis Date   Chronic kidney disease (CKD), stage III (moderate) ([place])     followed by pcp   Full dentures    History of 2019 novel coronavirus disease (COVID-19) 04/05/2019   result in care everywhere ,  per pt mild symptoms that resolved   History of gastric ulcer 2013   non-bleeding   Hyperlipemia    Mixed stress and urge incontinence    Osteoarthritis    Presence of pessary    Type 2 diabetes mellitus ([place])    followed by pcp   (08-26-2020 per pt checks blood sugar twice weekly, unsure what fasting sugar is)   Vaginal vault 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    gyn--- dr Talbert Nan, using pessary   Wears glasses     Past Surgical History:  Procedure Laterality Date   CARPAL TUNNEL RELEASE Right    1980s   CATARACT EXTRACTION W/ INTRAOCULAR LENS IMPLANT Bilateral 2013   approx   COLONOSCOPY WITH ESOPHAGOGASTRODUODENOSCOPY (EGD)  11/17/2011   COLPOCLEISIS N/A 09/02/2020  Procedure: COLPOCLEISIS, PERINEORRHAPHY;  Surgeon: Salvadore Dom, MD;  Location[facility];  Service: Gynecology;  Laterality: N/A;   CYSTOSCOPY N/A 09/02/2020   Procedure: CYSTOSCOPY;  Surgeon: Salvadore Dom, MD;  Location: [facility];  Service: Gynecology;  Laterality: N/A;   RETINAL DETACHMENT SURGERY Right 2003   TOTAL HIP ARTHROPLASTY Left 03/02/2010   '@[facility]'$    TOTAL KNEE ARTHROPLASTY Right 11/03/2015   Procedure: RIGHT TOTAL KNEE ARTHROPLASTY;  Surgeon: Gaynelle Arabian, MD;  Location: [facility];  Service: Orthopedics;  Laterality: Right;   TOTAL VAGINAL HYSTERECTOMY  2012   TVH, anterior repair, TVT (no mesh used for the anterior repair)    Allergies  Allergen Reactions   Latex Other (See Comments)    unknown   Norvasc [Amlodipine] Swelling       Objective:    Physical Exam Vitals and nursing note reviewed.  Constitutional:      Appearance: Normal appearance.  Cardiovascular:     Rate and Rhythm: Normal rate and regular rhythm.  Pulmonary:     Effort: Pulmonary effort is normal.     Breath sounds: Normal breath sounds.   Musculoskeletal:        General: Normal range of motion.  Skin:    General: Skin is warm and dry.  Neurological:     Mental Status: She is alert.  Psychiatric:        Mood and Affect: Mood normal.        Behavior: Behavior normal.     BP 107/69 (BP Location: Left Arm, Patient Position: Sitting, Cuff Size: Normal)   Pulse 87   Temp 98.3 F (36.8 C) (Temporal)   Ht '4\' 11"'$  (1.499 m)   Wt 149 lb 6 oz (67.8 kg)   LMP  (LMP Unknown)   SpO2 93%   BMI 30.17 kg/m  Wt Readings from Last 3 Encounters:  09/01/21 149 lb 6 oz (67.8 kg)  08/22/21 152 lb 12.5 oz (69.3 kg)  07/30/21 151 lb (68.5 kg)       Meds ordered this encounter  Medications   losartan (COZAAR) 50 MG tablet    Sig: Take 1 tablet (50 mg total) by mouth daily.    Dispense:  90 tablet    Refill:  1    D/C Losartan-HCTZ    Order Specific Question:   Supervising Provider    Answer:   ANDY, CAMILLE L [[id number]]   pravastatin (PRAVACHOL) 40 MG tablet    Sig: Take 1 tablet (40 mg total) by mouth at bedtime.    Dispense:  90 tablet    Refill:  1    Order Specific Question:   Supervising Provider    Answer:   ANDY, CAMILLE L [[id number]]   glipiZIDE (GLUCOTROL XL) 5 MG 24 hr tablet    Sig: Take 1 tablet (5 mg total) by mouth daily with breakfast.    Dispense:  90 tablet    Refill:  1    Order Specific Question:   Supervising Provider    Answer:   ANDY, CAMILLE L [[id number]]    Jeanie Sewer, NP

## 2021-09-01 NOTE — Assessment & Plan Note (Signed)
Chronic, unstable - A1C checked during recent hospital stay, up to 9.2. Advised on taking the Glipizide daily. Pt reports her son dying in Dec and she has been very sad and stressed as she is caring for her grandchildren. Advised to continue Glipizide, and she has to monitor her blood sugar and let me know readings in one week. May need to increase Glipizide dose.

## 2021-09-01 NOTE — Assessment & Plan Note (Signed)
Chronic - NA level low in hospital, will d/c HCTZ, continue Losartan, f/u in 3 mos.

## 2021-09-02 LAB — URINE CULTURE
MICRO NUMBER:: 13548141
SPECIMEN QUALITY:: ADEQUATE

## 2021-09-03 NOTE — Progress Notes (Signed)
Call Vaughan Basta - not sure if she saw my MyChart message - I told her UA results were trace infection and we were sending out for culture, but culture is negative. Verify she is not having any burning, pelvic pain, blood in urine, etc? let me know, thx

## 2021-09-04 MED ORDER — FLUCONAZOLE 150 MG PO TABS: ORAL_TABLET | ORAL | 0 refills | Status: DC

## 2021-09-07 MED ORDER — ACCU-CHEK GUIDE W/DEVICE KIT: PACK | 0 refills | Status: DC

## 2021-09-08 ENCOUNTER — Telehealth: Payer: Self-pay | Admitting: *Deleted

## 2021-09-08 NOTE — Chronic Care Management (AMB) (Signed)
  Care Coordination Note  09/08/2021 Name: Misty Cervantes MRN: [id number] DOB: Mar 30, 1945  Misty Cervantes is a 76 y.o. year old female who is a primary care patient of [place], Colletta Maryland, NP and is actively engaged with the care management team. I reached out to Barron Alvine by phone today to assist with re-scheduling an initial visit with the Licensed Clinical Social Worker  Follow up plan: Unsuccessful telephone outreach attempt made. A HIPAA compliant phone message was left for the patient providing contact information and requesting a return call.  The care management team will reach out to the patient again over the next 1-2 days.  If patient returns call to provider office, please advise to call [place] at [phone]. [place] Management  Direct Dial: [phone]

## 2021-09-09 NOTE — Chronic Care Management (AMB) (Signed)
  Chronic Care Management Note  09/09/2021 Name: Misty Cervantes MRN: [id number] DOB: 1945-05-06  Misty Cervantes is a 76 y.o. year old female who is a primary care patient of [place], Colletta Maryland, NP and is actively engaged with the care management team. I reached out to Barron Alvine by phone today to assist with re-scheduling an initial visit with the Licensed Clinical Social Worker  Follow up plan: Unsuccessful telephone outreach attempt made. A HIPAA compliant phone message was left for the patient providing contact information and requesting a return call.  The care management team will reach out to the patient again over the next 1-2 days.  If patient returns call to provider office, please advise to call [place] at [phone].  [place] Management  Direct Dial: [phone]

## 2021-09-10 NOTE — Chronic Care Management (AMB) (Signed)
  Care Coordination Note  09/10/2021 Name: CLARYCE FRIEL MRN: [id number] DOB: [birth year]  Misty Cervantes is a 76 y.o. year old female who is a primary care patient of [place], Colletta Maryland, NP and is actively engaged with the care management team. I reached out to Barron Alvine by phone today to assist with re-scheduling an initial visit with the Licensed Clinical Social Worker  Follow up plan: Unsuccessful telephone outreach attempt made. A HIPAA compliant phone message was left for the patient providing contact information and requesting a return call. Unable to make contact on outreach attempts x 3. PCP Jeanie Sewer, NP notified via routed documentation in medical record. We have been unable to make contact with the patient for follow up. The care management team is available to follow up with the patient after provider conversation with the patient regarding recommendation for care management engagement and subsequent re-referral to the care management team.   [place] Management  Direct Dial: [phone]

## 2021-10-11 ENCOUNTER — Other Ambulatory Visit: Payer: Self-pay | Admitting: Family

## 2021-10-11 DIAGNOSIS — J069 Acute upper respiratory infection, unspecified: Secondary | ICD-10-CM

## 2021-10-21 ENCOUNTER — Encounter (INDEPENDENT_AMBULATORY_CARE_PROVIDER_SITE_OTHER): Payer: Self-pay

## 2021-11-12 ENCOUNTER — Telehealth: Payer: Self-pay | Admitting: Family

## 2021-11-12 NOTE — Telephone Encounter (Signed)
Patient states: - She has been taking losartan as discussed following OV on 74/82/70 with no complications - Upon picking up recent refill, she noticed it was the losartan-HCTZ  - She is now confused if this was a recently prescribed medication or just an old medication that the pharmacy refilled  Patient requests: - A call from clinical team to confirm which medication she should be taking.   Patient can be reached at [phone]

## 2021-11-13 NOTE — Telephone Encounter (Signed)
I called pt's pharmacy, Pharmacy stated they will call patient.

## 2021-11-13 NOTE — Telephone Encounter (Signed)
Please call pt and pharmacy, she should only be on Losartan! - I put in pharmacy note to D/C the Losartan-HCTZ  went I sent her RX in June & it has been d/c in her chart - thanks

## 2021-11-27 ENCOUNTER — Encounter (HOSPITAL_BASED_OUTPATIENT_CLINIC_OR_DEPARTMENT_OTHER): Payer: Self-pay | Admitting: Emergency Medicine

## 2021-11-27 ENCOUNTER — Other Ambulatory Visit: Payer: Self-pay

## 2021-11-27 ENCOUNTER — Emergency Department (HOSPITAL_BASED_OUTPATIENT_CLINIC_OR_DEPARTMENT_OTHER): Payer: Medicare Other | Admitting: Radiology

## 2021-11-27 ENCOUNTER — Encounter (HOSPITAL_COMMUNITY): Payer: Self-pay

## 2021-11-27 ENCOUNTER — Inpatient Hospital Stay (HOSPITAL_BASED_OUTPATIENT_CLINIC_OR_DEPARTMENT_OTHER)
Admission: EM | Admit: 2021-11-27 | Discharge: 2021-12-02 | DRG: 871 | Disposition: A | Discharge status: Home / Self Care | Payer: Medicare Other | Attending: Family Medicine | Admitting: Family Medicine

## 2021-11-27 DIAGNOSIS — Z833 Family history of diabetes mellitus: Secondary | ICD-10-CM

## 2021-11-27 DIAGNOSIS — A419 Sepsis, unspecified organism: Principal | ICD-10-CM | POA: Diagnosis present

## 2021-11-27 DIAGNOSIS — J189 Pneumonia, unspecified organism: Secondary | ICD-10-CM | POA: Diagnosis not present

## 2021-11-27 DIAGNOSIS — Z91040 Latex allergy status: Secondary | ICD-10-CM

## 2021-11-27 DIAGNOSIS — Z8249 Family history of ischemic heart disease and other diseases of the circulatory system: Secondary | ICD-10-CM

## 2021-11-27 DIAGNOSIS — E876 Hypokalemia: Secondary | ICD-10-CM

## 2021-11-27 DIAGNOSIS — R519 Headache, unspecified: Secondary | ICD-10-CM

## 2021-11-27 DIAGNOSIS — E1159 Type 2 diabetes mellitus with other circulatory complications: Secondary | ICD-10-CM | POA: Diagnosis present

## 2021-11-27 DIAGNOSIS — J9601 Acute respiratory failure with hypoxia: Secondary | ICD-10-CM | POA: Diagnosis present

## 2021-11-27 DIAGNOSIS — M1990 Unspecified osteoarthritis, unspecified site: Secondary | ICD-10-CM | POA: Diagnosis present

## 2021-11-27 DIAGNOSIS — E669 Obesity, unspecified: Secondary | ICD-10-CM | POA: Diagnosis present

## 2021-11-27 DIAGNOSIS — Z8616 Personal history of COVID-19: Secondary | ICD-10-CM

## 2021-11-27 DIAGNOSIS — Z888 Allergy status to other drugs, medicaments and biological substances status: Secondary | ICD-10-CM

## 2021-11-27 DIAGNOSIS — Z79899 Other long term (current) drug therapy: Secondary | ICD-10-CM

## 2021-11-27 DIAGNOSIS — Z6830 Body mass index (BMI) 30.0-30.9, adult: Secondary | ICD-10-CM

## 2021-11-27 DIAGNOSIS — Z9842 Cataract extraction status, left eye: Secondary | ICD-10-CM

## 2021-11-27 DIAGNOSIS — E785 Hyperlipidemia, unspecified: Secondary | ICD-10-CM | POA: Diagnosis present

## 2021-11-27 DIAGNOSIS — Y95 Nosocomial condition: Secondary | ICD-10-CM | POA: Diagnosis present

## 2021-11-27 DIAGNOSIS — Z7982 Long term (current) use of aspirin: Secondary | ICD-10-CM

## 2021-11-27 DIAGNOSIS — R0603 Acute respiratory distress: Secondary | ICD-10-CM | POA: Diagnosis present

## 2021-11-27 DIAGNOSIS — I152 Hypertension secondary to endocrine disorders: Secondary | ICD-10-CM | POA: Diagnosis present

## 2021-11-27 DIAGNOSIS — Z20822 Contact with and (suspected) exposure to covid-19: Secondary | ICD-10-CM | POA: Diagnosis present

## 2021-11-27 DIAGNOSIS — N189 Chronic kidney disease, unspecified: Secondary | ICD-10-CM | POA: Diagnosis present

## 2021-11-27 DIAGNOSIS — Z8711 Personal history of peptic ulcer disease: Secondary | ICD-10-CM

## 2021-11-27 DIAGNOSIS — N1831 Chronic kidney disease, stage 3a: Secondary | ICD-10-CM | POA: Diagnosis present

## 2021-11-27 DIAGNOSIS — E1169 Type 2 diabetes mellitus with other specified complication: Secondary | ICD-10-CM | POA: Diagnosis present

## 2021-11-27 DIAGNOSIS — Z9841 Cataract extraction status, right eye: Secondary | ICD-10-CM

## 2021-11-27 DIAGNOSIS — E1165 Type 2 diabetes mellitus with hyperglycemia: Secondary | ICD-10-CM | POA: Diagnosis present

## 2021-11-27 DIAGNOSIS — E1122 Type 2 diabetes mellitus with diabetic chronic kidney disease: Secondary | ICD-10-CM | POA: Diagnosis present

## 2021-11-27 DIAGNOSIS — Z7984 Long term (current) use of oral hypoglycemic drugs: Secondary | ICD-10-CM

## 2021-11-27 DIAGNOSIS — Z961 Presence of intraocular lens: Secondary | ICD-10-CM | POA: Diagnosis present

## 2021-11-27 DIAGNOSIS — Z8051 Family history of malignant neoplasm of kidney: Secondary | ICD-10-CM

## 2021-11-27 DIAGNOSIS — Z96651 Presence of right artificial knee joint: Secondary | ICD-10-CM | POA: Diagnosis present

## 2021-11-27 DIAGNOSIS — Z96642 Presence of left artificial hip joint: Secondary | ICD-10-CM | POA: Diagnosis present

## 2021-11-27 LAB — CBC WITH DIFFERENTIAL/PLATELET
Abs Immature Granulocytes: 0.87 10*3/uL — ABNORMAL HIGH (ref 0.00–0.07)
Basophils Absolute: 0.9 10*3/uL — ABNORMAL HIGH (ref 0.0–0.1)
Basophils Relative: 7 %
Eosinophils Absolute: 0 10*3/uL (ref 0.0–0.5)
Eosinophils Relative: 0 %
HCT: 41.2 % (ref 36.0–46.0)
Hemoglobin: 14.1 g/dL (ref 12.0–15.0)
Immature Granulocytes: 7 %
Lymphocytes Relative: 9 %
Lymphs Abs: 1.2 10*3/uL (ref 0.7–4.0)
MCH: 30.8 pg (ref 26.0–34.0)
MCHC: 34.2 g/dL (ref 30.0–36.0)
MCV: 90 fL (ref 80.0–100.0)
Monocytes Absolute: 1.1 10*3/uL — ABNORMAL HIGH (ref 0.1–1.0)
Monocytes Relative: 8 %
Neutro Abs: 9 10*3/uL — ABNORMAL HIGH (ref 1.7–7.7)
Neutrophils Relative %: 69 %
Platelets: 353 10*3/uL (ref 150–400)
RBC: 4.58 MIL/uL (ref 3.87–5.11)
RDW: 12.3 % (ref 11.5–15.5)
WBC: 13 10*3/uL — ABNORMAL HIGH (ref 4.0–10.5)
nRBC: 0 % (ref 0.0–0.2)

## 2021-11-27 LAB — COMPREHENSIVE METABOLIC PANEL
ALT: 23 U/L (ref 0–44)
AST: 26 U/L (ref 15–41)
Albumin: 4.2 g/dL (ref 3.5–5.0)
Alkaline Phosphatase: 119 U/L (ref 38–126)
Anion gap: 13 (ref 5–15)
BUN: 20 mg/dL (ref 8–23)
CO2: 24 mmol/L (ref 22–32)
Calcium: 9.7 mg/dL (ref 8.9–10.3)
Chloride: 91 mmol/L — ABNORMAL LOW (ref 98–111)
Creatinine, Ser: 1.04 mg/dL — ABNORMAL HIGH (ref 0.44–1.00)
GFR, Estimated: 56 mL/min — ABNORMAL LOW (ref >60)
Glucose, Bld: 444 mg/dL — ABNORMAL HIGH (ref 70–99)
Potassium: 3.8 mmol/L (ref 3.5–5.1)
Sodium: 128 mmol/L — ABNORMAL LOW (ref 135–145)
Total Bilirubin: 0.8 mg/dL (ref 0.3–1.2)
Total Protein: 7.9 g/dL (ref 6.5–8.1)

## 2021-11-27 LAB — LACTIC ACID, PLASMA
Lactic Acid, Venous: 1.7 mmol/L (ref 0.5–1.9)
Lactic Acid, Venous: 1.5 mmol/L (ref 0.5–1.9)

## 2021-11-27 LAB — RESP PANEL BY RT-PCR (FLU A&B, COVID) ARPGX2
Influenza A by PCR: NEGATIVE
Influenza B by PCR: NEGATIVE
SARS Coronavirus 2 by RT PCR: NEGATIVE

## 2021-11-27 MED ORDER — SODIUM CHLORIDE 0.9 % IV SOLN
1.0000 g | Freq: Once | INTRAVENOUS | Status: AC
  Administered 2021-11-27: 1 g via INTRAVENOUS
  Filled 2021-11-27: qty 10

## 2021-11-27 MED ORDER — SODIUM CHLORIDE 0.9 % IV SOLN
500.0000 mg | Freq: Once | INTRAVENOUS | Status: AC
  Administered 2021-11-27: 500 mg via INTRAVENOUS
  Filled 2021-11-27: qty 5

## 2021-11-27 NOTE — ED Triage Notes (Signed)
Pt c/o SHOB, runny nose, cough, decreased appetite, and fever x 2 weeks. Pt states that highest temp at home was 101.2

## 2021-11-27 NOTE — ED Notes (Signed)
RT assessed pt and placed on monitor, obtained vitals. Pt respiratory status stable w/minimal distress (generally when talking or moving) Pt on RA with sats of 95% at this time. Pts BLBS clear uppers/clear diminished lowers. Pt has increased WOB upon exertion. No pulmonary history in chart. RT will continue to monitor while in [facility] ED.

## 2021-11-27 NOTE — ED Notes (Signed)
Only able to get one set of cultures. Tried twice. Antibiotics given instead of waiting for another attempt. Provider aware.

## 2021-11-27 NOTE — ED Notes (Signed)
Second set of blood cultures collected after IV abx were started

## 2021-11-28 DIAGNOSIS — Z8051 Family history of malignant neoplasm of kidney: Secondary | ICD-10-CM | POA: Diagnosis not present

## 2021-11-28 DIAGNOSIS — J189 Pneumonia, unspecified organism: Secondary | ICD-10-CM | POA: Diagnosis present

## 2021-11-28 DIAGNOSIS — A419 Sepsis, unspecified organism: Secondary | ICD-10-CM

## 2021-11-28 DIAGNOSIS — J9601 Acute respiratory failure with hypoxia: Secondary | ICD-10-CM | POA: Diagnosis present

## 2021-11-28 DIAGNOSIS — E1165 Type 2 diabetes mellitus with hyperglycemia: Secondary | ICD-10-CM

## 2021-11-28 DIAGNOSIS — M1990 Unspecified osteoarthritis, unspecified site: Secondary | ICD-10-CM | POA: Diagnosis present

## 2021-11-28 DIAGNOSIS — Z20822 Contact with and (suspected) exposure to covid-19: Secondary | ICD-10-CM | POA: Diagnosis present

## 2021-11-28 DIAGNOSIS — E785 Hyperlipidemia, unspecified: Secondary | ICD-10-CM

## 2021-11-28 DIAGNOSIS — Z8711 Personal history of peptic ulcer disease: Secondary | ICD-10-CM | POA: Diagnosis not present

## 2021-11-28 DIAGNOSIS — E669 Obesity, unspecified: Secondary | ICD-10-CM | POA: Diagnosis present

## 2021-11-28 DIAGNOSIS — E1169 Type 2 diabetes mellitus with other specified complication: Secondary | ICD-10-CM | POA: Diagnosis present

## 2021-11-28 DIAGNOSIS — E876 Hypokalemia: Secondary | ICD-10-CM | POA: Diagnosis present

## 2021-11-28 DIAGNOSIS — E1159 Type 2 diabetes mellitus with other circulatory complications: Secondary | ICD-10-CM

## 2021-11-28 DIAGNOSIS — N1831 Chronic kidney disease, stage 3a: Secondary | ICD-10-CM | POA: Diagnosis present

## 2021-11-28 DIAGNOSIS — Z8616 Personal history of COVID-19: Secondary | ICD-10-CM | POA: Diagnosis not present

## 2021-11-28 DIAGNOSIS — Z961 Presence of intraocular lens: Secondary | ICD-10-CM | POA: Diagnosis present

## 2021-11-28 DIAGNOSIS — Y95 Nosocomial condition: Secondary | ICD-10-CM | POA: Diagnosis present

## 2021-11-28 DIAGNOSIS — Z8249 Family history of ischemic heart disease and other diseases of the circulatory system: Secondary | ICD-10-CM | POA: Diagnosis not present

## 2021-11-28 DIAGNOSIS — I152 Hypertension secondary to endocrine disorders: Secondary | ICD-10-CM

## 2021-11-28 DIAGNOSIS — E1122 Type 2 diabetes mellitus with diabetic chronic kidney disease: Secondary | ICD-10-CM | POA: Diagnosis present

## 2021-11-28 DIAGNOSIS — R0603 Acute respiratory distress: Secondary | ICD-10-CM

## 2021-11-28 DIAGNOSIS — Z7984 Long term (current) use of oral hypoglycemic drugs: Secondary | ICD-10-CM | POA: Diagnosis not present

## 2021-11-28 DIAGNOSIS — Z9842 Cataract extraction status, left eye: Secondary | ICD-10-CM | POA: Diagnosis not present

## 2021-11-28 DIAGNOSIS — Z833 Family history of diabetes mellitus: Secondary | ICD-10-CM | POA: Diagnosis not present

## 2021-11-28 DIAGNOSIS — Z9841 Cataract extraction status, right eye: Secondary | ICD-10-CM | POA: Diagnosis not present

## 2021-11-28 DIAGNOSIS — Z79899 Other long term (current) drug therapy: Secondary | ICD-10-CM | POA: Diagnosis not present

## 2021-11-28 DIAGNOSIS — Z6830 Body mass index (BMI) 30.0-30.9, adult: Secondary | ICD-10-CM | POA: Diagnosis not present

## 2021-11-28 HISTORY — DX: Acute respiratory distress: R06.03

## 2021-11-28 HISTORY — DX: Sepsis, unspecified organism: A41.9

## 2021-11-28 LAB — BASIC METABOLIC PANEL
Anion gap: 5 (ref 5–15)
BUN: 14 mg/dL (ref 8–23)
CO2: 24 mmol/L (ref 22–32)
Calcium: 8.1 mg/dL — ABNORMAL LOW (ref 8.9–10.3)
Chloride: 102 mmol/L (ref 98–111)
Creatinine, Ser: 0.98 mg/dL (ref 0.44–1.00)
GFR, Estimated: 60 mL/min — ABNORMAL LOW (ref >60)
Glucose, Bld: 318 mg/dL — ABNORMAL HIGH (ref 70–99)
Potassium: 3.5 mmol/L (ref 3.5–5.1)
Sodium: 131 mmol/L — ABNORMAL LOW (ref 135–145)

## 2021-11-28 LAB — GLUCOSE, CAPILLARY
Glucose-Capillary: 330 mg/dL — ABNORMAL HIGH (ref 70–99)
Glucose-Capillary: 353 mg/dL — ABNORMAL HIGH (ref 70–99)

## 2021-11-28 LAB — PROCALCITONIN: Procalcitonin: 0.3 ng/mL

## 2021-11-28 LAB — BRAIN NATRIURETIC PEPTIDE: B Natriuretic Peptide: 72.1 pg/mL (ref 0.0–100.0)

## 2021-11-28 LAB — D-DIMER, QUANTITATIVE: D-Dimer, Quant: 2.14 ug/mL-FEU — ABNORMAL HIGH (ref 0.00–0.50)

## 2021-11-28 LAB — CBG MONITORING, ED: Glucose-Capillary: 308 mg/dL — ABNORMAL HIGH (ref 70–99)

## 2021-11-28 LAB — MRSA NEXT GEN BY PCR, NASAL: MRSA by PCR Next Gen: NOT DETECTED

## 2021-11-28 MED ORDER — LORATADINE 10 MG PO TABS: 10.0000 mg | ORAL_TABLET | Freq: Every day | ORAL | Status: DC | PRN

## 2021-11-28 MED ORDER — LOSARTAN POTASSIUM 50 MG PO TABS
50.0000 mg | ORAL_TABLET | Freq: Every day | ORAL | Status: DC
  Administered 2021-11-28 – 2021-12-02 (×5): 50 mg via ORAL
  Filled 2021-11-28 (×5): qty 1

## 2021-11-28 MED ORDER — INSULIN ASPART 100 UNIT/ML IJ SOLN
0.0000 [IU] | Freq: Three times a day (TID) | INTRAMUSCULAR | Status: DC
  Administered 2021-11-28: 11 [IU] via SUBCUTANEOUS
  Administered 2021-11-29 (×3): 8 [IU] via SUBCUTANEOUS
  Administered 2021-11-30: 11 [IU] via SUBCUTANEOUS
  Administered 2021-11-30: 5 [IU] via SUBCUTANEOUS
  Administered 2021-11-30 – 2021-12-01 (×2): 8 [IU] via SUBCUTANEOUS
  Administered 2021-12-01: 5 [IU] via SUBCUTANEOUS
  Administered 2021-12-01 – 2021-12-02 (×2): 8 [IU] via SUBCUTANEOUS
  Administered 2021-12-02: 5 [IU] via SUBCUTANEOUS

## 2021-11-28 MED ORDER — SODIUM CHLORIDE 0.9 % IV SOLN
2.0000 g | INTRAVENOUS | Status: DC
  Administered 2021-11-28 – 2021-11-30 (×3): 2 g via INTRAVENOUS
  Filled 2021-11-28 (×4): qty 20

## 2021-11-28 MED ORDER — SODIUM CHLORIDE 0.9 % IV SOLN: 2.0000 g | INTRAVENOUS | Status: DC

## 2021-11-28 MED ORDER — ACETAMINOPHEN 325 MG PO TABS
650.0000 mg | ORAL_TABLET | Freq: Four times a day (QID) | ORAL | Status: DC | PRN
  Administered 2021-12-01: 650 mg via ORAL
  Filled 2021-11-28: qty 2

## 2021-11-28 MED ORDER — ASPIRIN 81 MG PO TBEC
81.0000 mg | DELAYED_RELEASE_TABLET | Freq: Every day | ORAL | Status: DC
  Administered 2021-11-28 – 2021-12-02 (×5): 81 mg via ORAL
  Filled 2021-11-28 (×6): qty 1

## 2021-11-28 MED ORDER — ONDANSETRON HCL 4 MG PO TABS: 4.0000 mg | ORAL_TABLET | Freq: Four times a day (QID) | ORAL | Status: DC | PRN

## 2021-11-28 MED ORDER — AZITHROMYCIN 500 MG PO TABS
500.0000 mg | ORAL_TABLET | Freq: Every day | ORAL | Status: AC
  Administered 2021-11-28 – 2021-12-01 (×4): 500 mg via ORAL
  Filled 2021-11-28 (×4): qty 1

## 2021-11-28 MED ORDER — AZITHROMYCIN 500 MG PO TABS: 500.0000 mg | ORAL_TABLET | Freq: Every day | ORAL | Status: DC

## 2021-11-28 MED ORDER — ALBUTEROL SULFATE (2.5 MG/3ML) 0.083% IN NEBU
2.5000 mg | INHALATION_SOLUTION | RESPIRATORY_TRACT | Status: DC | PRN
  Administered 2021-11-30: 2.5 mg via RESPIRATORY_TRACT
  Filled 2021-11-28: qty 3

## 2021-11-28 MED ORDER — GUAIFENESIN ER 600 MG PO TB12
600.0000 mg | ORAL_TABLET | Freq: Two times a day (BID) | ORAL | Status: DC
  Administered 2021-11-28 (×2): 600 mg via ORAL
  Filled 2021-11-28 (×3): qty 1

## 2021-11-28 MED ORDER — SODIUM CHLORIDE 0.9% FLUSH
3.0000 mL | Freq: Two times a day (BID) | INTRAVENOUS | Status: DC
  Administered 2021-11-28 – 2021-12-02 (×6): 3 mL via INTRAVENOUS

## 2021-11-28 MED ORDER — ENOXAPARIN SODIUM 40 MG/0.4ML IJ SOSY
40.0000 mg | PREFILLED_SYRINGE | INTRAMUSCULAR | Status: DC
  Administered 2021-11-28 – 2021-12-01 (×4): 40 mg via SUBCUTANEOUS
  Filled 2021-11-28 (×4): qty 0.4

## 2021-11-28 MED ORDER — FLUTICASONE PROPIONATE 50 MCG/ACT NA SUSP: 1.0000 | Freq: Two times a day (BID) | NASAL | Status: DC | PRN

## 2021-11-28 MED ORDER — INSULIN ASPART 100 UNIT/ML IJ SOLN
0.0000 [IU] | Freq: Every day | INTRAMUSCULAR | Status: DC
  Administered 2021-11-28: 5 [IU] via SUBCUTANEOUS
  Administered 2021-11-29 – 2021-11-30 (×2): 2 [IU] via SUBCUTANEOUS
  Administered 2021-12-01: 3 [IU] via SUBCUTANEOUS

## 2021-11-28 MED ORDER — PRAVASTATIN SODIUM 40 MG PO TABS
40.0000 mg | ORAL_TABLET | Freq: Every day | ORAL | Status: DC
  Administered 2021-11-28 – 2021-12-01 (×4): 40 mg via ORAL
  Filled 2021-11-28 (×4): qty 1

## 2021-11-28 MED ORDER — ONDANSETRON HCL 4 MG/2ML IJ SOLN: 4.0000 mg | Freq: Four times a day (QID) | INTRAMUSCULAR | Status: DC | PRN

## 2021-11-28 MED ORDER — ACETAMINOPHEN 650 MG RE SUPP: 650.0000 mg | Freq: Four times a day (QID) | RECTAL | Status: DC | PRN

## 2021-11-28 NOTE — ED Provider Notes (Signed)
[facility] EMERGENCY DEPT Provider Note   CSN: [id number] Arrival date & time: 11/27/21  2000     History  Chief Complaint  Patient presents with   Shortness of Breath    Misty Cervantes is a 76 y.o. female.  HPI     77yo female with history of htn, hlpd, DM, CKD stage III, admission in June for pneumonia, presents with cough for 2 weeks, fever at home, generalized weakness.  Reports for 2 weeks has had worsening cough. Has not been able to sleep due to coughing. Short of breath with minimal exertion. Fever at home to 101.2.  Has had congestion, decreased appetite.  Chest pain with coughing. No leg swelling.   Home Medications Prior to Admission medications   Medication Sig Start Date End Date Taking? Authorizing Provider  Accu-Chek FastClix Lancets MISC Check blood sugars 1-2 times per day. 01/25/19   Orma Flaming, MD  acetaminophen (TYLENOL) 500 MG tablet Take 1,000 mg by mouth 2 (two) times daily as needed for headache (pain).    [provider]  aspirin EC 81 MG tablet Take 81 mg by mouth daily. Swallow whole.    [provider]  Blood Glucose Monitoring Suppl (ACCU-CHEK GUIDE) w/Device KIT Use as needed 09/07/21   Jeanie Sewer, NP  Cyanocobalamin (B-12 PO) Take 1 tablet by mouth daily.    [provider]  fluconazole (DIFLUCAN) 150 MG tablet Take 1 pill today and the 2nd pill in 3 days. 09/04/21   Hudnell, Colletta [place], NP  fluticasone (FLONASE) 50 MCG/ACT nasal spray SPRAY 1 SPRAY INTO EACH NOSTRIL TWICE A DAY AS NEEDED FOR ALLERGIES OR RHINITIS 10/12/21   Jeanie Sewer, NP  glipiZIDE (GLUCOTROL XL) 5 MG 24 hr tablet Take 1 tablet (5 mg total) by mouth daily with breakfast. 09/01/21   Jeanie Sewer, NP  glucose blood (ACCU-CHEK GUIDE) test strip TEST FOUR TIMES DAILY 11/21/20   Vivi Barrack, MD  losartan (COZAAR) 50 MG tablet Take 1 tablet (50 mg total) by mouth daily. 09/01/21   Jeanie Sewer, NP  meclizine  (ANTIVERT) 25 MG tablet TAKE 1 TABLET BY MOUTH 3 TIMES A DAY AS NEEDED FOR DIZZINESS 04/03/21   Jeanie Sewer, NP  Multiple Vitamin (MULTIVITAMIN WITH MINERALS) TABS tablet Take 1 tablet by mouth daily.    [provider]  pravastatin (PRAVACHOL) 40 MG tablet Take 1 tablet (40 mg total) by mouth at bedtime. 09/01/21   Jeanie Sewer, NP      Allergies    Latex and Norvasc [amlodipine]    Review of Systems   Review of Systems  Physical Exam Updated Vital Signs BP 125/64   Pulse 89   Temp 98.3 F (36.8 C) (Oral)   Resp 20   Ht 4' 11"  (1.499 m)   Wt 68 kg   LMP  (LMP Unknown)   SpO2 96%   BMI 30.28 kg/m  Physical Exam Vitals and nursing note reviewed.  Constitutional:      General: She is not in acute distress.    Appearance: She is well-developed. She is not diaphoretic.  HENT:     Head: Normocephalic and atraumatic.  Eyes:     Conjunctiva/sclera: Conjunctivae normal.  Cardiovascular:     Rate and Rhythm: Normal rate and regular rhythm.     Heart sounds: Normal heart sounds. No murmur heard.    No friction rub. No gallop.  Pulmonary:     Effort: Pulmonary effort is normal. No respiratory distress.  Breath sounds: Normal breath sounds. No wheezing or rales.  Abdominal:     General: There is no distension.     Palpations: Abdomen is soft.     Tenderness: There is no abdominal tenderness. There is no guarding.  Musculoskeletal:        General: No tenderness.     Cervical back: Normal range of motion.  Skin:    General: Skin is warm and dry.     Findings: No erythema or rash.  Neurological:     Mental Status: She is alert and oriented to person, place, and time.     ED Results / Procedures / Treatments   Labs (all labs ordered are listed, but only abnormal results are displayed) Labs Reviewed  CBC WITH DIFFERENTIAL/PLATELET - Abnormal; Notable for the following components:      Result Value   WBC 13.0 (*)    Neutro Abs 9.0 (*)    Monocytes  Absolute 1.1 (*)    Basophils Absolute 0.9 (*)    Abs Immature Granulocytes 0.87 (*)    All other components within normal limits  COMPREHENSIVE METABOLIC PANEL - Abnormal; Notable for the following components:   Sodium 128 (*)    Chloride 91 (*)    Glucose, Bld 444 (*)    Creatinine, Ser 1.04 (*)    GFR, Estimated 56 (*)    All other components within normal limits  CBG MONITORING, ED - Abnormal; Notable for the following components:   Glucose-Capillary 308 (*)    All other components within normal limits  RESP PANEL BY RT-PCR (FLU A&B, COVID) ARPGX2  CULTURE, BLOOD (ROUTINE X 2)  CULTURE, BLOOD (ROUTINE X 2)  LACTIC ACID, PLASMA  LACTIC ACID, PLASMA    EKG EKG Interpretation  Date/Time:  Friday November 27 2021 20:16:38 EDT Ventricular Rate:  105 PR Interval:  204 QRS Duration: 76 QT Interval:  338 QTC Calculation: 446 R Axis:   241 Text Interpretation: Sinus tachycardia Right superior axis deviation Inferior infarct , age undetermined Anterior infarct , age undetermined Abnormal ECG When compared with ECG of 22-Aug-2021 15:20, No significant change since last tracing Confirmed by Gareth Morgan [phone]) on 11/27/2021 11:05:47 PM  Radiology DG Chest 2 View  Result Date: 11/27/2021 CLINICAL DATA:  Shortness of breath EXAM: CHEST - 2 VIEW COMPARISON:  08/22/2021, 11/26/2019, CT 2 08/23/2021 FINDINGS: Minimal heterogeneous basilar opacity. Stable cardiomediastinal silhouette. No pneumothorax. Aortic atherosclerosis. IMPRESSION: Mild heterogeneous airspace opacity at the bases could reflect atelectasis, pneumonia or aspiration Electronically Signed   By: Donavan Foil M.D.   On: 11/27/2021 20:55    Procedures Procedures    Medications Ordered in ED Medications  cefTRIAXone (ROCEPHIN) 1 g in sodium chloride 0.9 % 100 mL IVPB (0 g Intravenous Stopped 11/27/21 2313)  azithromycin (ZITHROMAX) 500 mg in sodium chloride 0.9 % 250 mL IVPB (0 mg Intravenous Stopped 11/27/21 2313)     ED Course/ Medical Decision Making/ A&P                             76yo female with history of htn, hlpd, DM, CKD stage III, admission in June for pneumonia, presents with cough for 2 weeks, fever at home, generalized weakness.  Differential diagnosis for dyspnea includes ACS, PE, COPD exacerbation, CHF exacerbation, anemia, pneumonia, viral etiology such as COVID 19 infection, metabolic abnormality.  Chest x-ray was done and personally evaluated by me which showed airspace opacities which could represent  pneumonia, atelectasis or aspiration. . EKG was evaluated by me which showed sinus tachycardia.  Do not see signs of volume overload on exam to suggest CHF. History and exam consistent with pneumonia with 2 weeks of cough, fever, and labs with leukocytosis.  COVID/flu negative.  Lactic acid negative.  Labss personally evaluated by me show hyperglycemia, no other significant changes.   Given generalized weakness, dyspnea, will admit for inpatient care of pneumonia. Given rocephin/azithromycin.             Final Clinical Impression(s) / ED Diagnoses Final diagnoses:  Pneumonia of both lower lobes due to infectious organism    Rx / DC Orders ED Discharge Orders     None         Gareth Morgan, MD 11/28/21 [phone]

## 2021-11-28 NOTE — ED Notes (Signed)
Pt found sitting at the end of her bed, taking her hospital gown off, and putting her sweater on. Pt helped back up in the bed, gown and warm blankets applied. Pt educated on importance of using the call bell and stated that she would next time. Pt A&O x 4.

## 2021-11-28 NOTE — ED Notes (Signed)
This nurse has assumed care.  Pt HOH however is awake and alert  - GCS 15.  Persistent non prod cough noted while this nurse at bedside.  Pt speaking in clear complete sentences; O2 sats stable on RA.  Continuous pulse ox and cardiac monitoring maintained.  Abdomen round soft.  Skin warm dry  and intact.  No complaints verbalized at this time.  Will continue to monitor for acute changes and maintain plan of care.

## 2021-11-28 NOTE — H&P (Signed)
History and Physical    Patient: Misty Cervantes WCB:[id number] DOB: [birth year] DOA: 11/27/2021 DOS: the patient was seen and examined on 11/28/2021 PCP: Jeanie Sewer, NP  Patient coming from: [place] transfer  Chief Complaint:  Chief Complaint  Patient presents with   Shortness of Breath   HPI: ZOA DOWTY is a 76 y.o. female with medical history significant of hypertension, hyperlipidemia, diabetes mellitus type 2, CKD stage III, osteoarthritis, history of gastric ulcers who presents with complaints of cough and shortness of breath.  Patient had last been hospitalized in 6/10-6/12 for community-acquired pneumonia ultimately discharged home to complete course of cefdinir and azithromycin.  Patient reported that after that hospitalization she felt like she initially got better, but intermittently had noted a cough.  However, over the last 2 weeks she reports the cough which was mostly nonproductive had worsened to the point where she was unable to sleep at night.  She had been taking cough medicine without improvement in symptoms.  Reported feeling short of breath even minimal exertion, fevers elevated up to 101.2 F, generalized weakness, decreased appetite, and pain in all over from coughing.  Denied having any vomiting, or episodes of diarrhea.  Patient has no significant history of tobacco abuse. Her grandkids whom stay with her ages 49 and 60 had urged her to come to the doctor for the last 7-8 days as she had been stuck in the bed not getting up.  They note over the last 2 days she had been slightly confused.  On admission into the emergency department patient was seen to be afebrile with tachycardia, tachypnea, and O2 saturations noted to be as low as 89% with improvement on 3 L of nasal cannula oxygen with O2 saturation greater than 92%.  Labs yesterday significant for WBC 13, sodium 128, BUN 20, creatinine 1.04, glucose 444, anion gap 13 and lactic acid 1.7->1.5.  Chest x-ray  noted mild heterogeneous airspace opacity at the bases possibly reflecting atelectasis, pneumonia, or aspiration.  Influenza and COVID-19 screening were negative.  Patient had been given Rocephin and azithromycin  Review of Systems: As mentioned in the history of present illness. All other systems reviewed and are negative. Past Medical History:  Diagnosis Date   Chronic kidney disease (CKD), stage III (moderate) ([place])    followed by pcp   Full dentures    History of 2019 novel coronavirus disease (COVID-19) 04/05/2019   result in care everywhere ,  per pt mild symptoms that resolved   History of gastric ulcer 2013   non-bleeding   Hyperlipemia    Mixed stress and urge incontinence    Osteoarthritis    Presence of pessary    Type 2 diabetes mellitus ([place])    followed by pcp   (08-26-2020 per pt checks blood sugar twice weekly, unsure what fasting sugar is)   Vaginal vault 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    gyn--- dr Talbert Nan, using pessary   Wears glasses    Past Surgical History:  Procedure Laterality Date   CARPAL TUNNEL RELEASE Right    1980s   CATARACT EXTRACTION W/ INTRAOCULAR LENS IMPLANT Bilateral 2013   approx   COLONOSCOPY WITH ESOPHAGOGASTRODUODENOSCOPY (EGD)  11/17/2011   COLPOCLEISIS N/A 09/02/2020   Procedure: COLPOCLEISIS, PERINEORRHAPHY;  Surgeon: Salvadore Dom, MD;  Location: [facility];  Service: Gynecology;  Laterality: N/A;   CYSTOSCOPY N/A 09/02/2020   Procedure: CYSTOSCOPY;  Surgeon: Salvadore Dom, MD;  Location: [facility];  Service: Gynecology;  Laterality:  N/A;   RETINAL DETACHMENT SURGERY Right 2003   TOTAL HIP ARTHROPLASTY Left 03/02/2010   _0    TOTAL KNEE ARTHROPLASTY Right 11/03/2015   Procedure: RIGHT TOTAL KNEE ARTHROPLASTY;  Surgeon: Gaynelle Arabian, MD;  Location: [facility];  Service: Orthopedics;  Laterality: Right;   TOTAL VAGINAL HYSTERECTOMY  2012   TVH, anterior repair, TVT (no mesh used for the anterior  repair)   Social History:  reports that she has never smoked. She has never used smokeless tobacco. She reports that she does not drink alcohol and does not use drugs.  Allergies  Allergen Reactions   Latex Other (See Comments)    unknown   Norvasc [Amlodipine] Swelling    Family History  Problem Relation Age of Onset   Diabetes Mother        deceased   Heart attack Mother    Diabetes Sister        x2   Kidney cancer Father    Heart attack Father    Diabetes Sister     Prior to Admission medications   Medication Sig Start Date End Date Taking? Authorizing Provider  Accu-Chek FastClix Lancets MISC Check blood sugars 1-2 times per day. 01/25/19   Orma Flaming, MD  acetaminophen (TYLENOL) 500 MG tablet Take 1,000 mg by mouth 2 (two) times daily as needed for headache (pain).    [provider]  aspirin EC 81 MG tablet Take 81 mg by mouth daily. Swallow whole.    [provider]  Blood Glucose Monitoring Suppl (ACCU-CHEK GUIDE) w/Device KIT Use as needed 09/07/21   Jeanie Sewer, NP  Cyanocobalamin (B-12 PO) Take 1 tablet by mouth daily.    [provider]  fluconazole (DIFLUCAN) 150 MG tablet Take 1 pill today and the 2nd pill in 3 days. 09/04/21   Hudnell, Colletta [place], NP  fluticasone (FLONASE) 50 MCG/ACT nasal spray SPRAY 1 SPRAY INTO EACH NOSTRIL TWICE A DAY AS NEEDED FOR ALLERGIES OR RHINITIS 10/12/21   Jeanie Sewer, NP  glipiZIDE (GLUCOTROL XL) 5 MG 24 hr tablet Take 1 tablet (5 mg total) by mouth daily with breakfast. 09/01/21   Jeanie Sewer, NP  glucose blood (ACCU-CHEK GUIDE) test strip TEST FOUR TIMES DAILY 11/21/20   Vivi Barrack, MD  losartan (COZAAR) 50 MG tablet Take 1 tablet (50 mg total) by mouth daily. 09/01/21   Jeanie Sewer, NP  losartan-hydrochlorothiazide (HYZAAR) 50-12.5 MG tablet Take 1 tablet by mouth daily. 10/25/21   [provider]  meclizine (ANTIVERT) 25 MG tablet TAKE 1 TABLET BY MOUTH 3 TIMES A DAY AS  NEEDED FOR DIZZINESS 04/03/21   Jeanie Sewer, NP  Multiple Vitamin (MULTIVITAMIN WITH MINERALS) TABS tablet Take 1 tablet by mouth daily.    [provider]  phentermine (ADIPEX-P) 37.5 MG tablet Take 37.5 mg by mouth 2 (two) times daily. 10/20/21   [provider]  pravastatin (PRAVACHOL) 40 MG tablet Take 1 tablet (40 mg total) by mouth at bedtime. 09/01/21   Jeanie Sewer, NP    Physical Exam: Vitals:   11/28/21 0830 11/28/21 0900 11/28/21 1200 11/28/21 1300  BP: 125/64 (!) 141/74 138/76 (!) 141/98  Pulse: 89 98 87 89  Resp: 20 (!) _1 Temp:  98.8 F (37.1 C) 98.8 F (37.1 C)   TempSrc:  Oral    SpO2: 96% 91% 95% 91%  Weight:      Height:       Constitutional: Obese elderly female who appears to be ill but  in no acute distress Eyes: PERRL, lids and conjunctivae normal ENMT: Mucous membranes are moist.  Neck: normal, supple  Respiratory: Decreased overall aeration stated in the bases bilaterally.  No significant wheezes or rhonchi appreciated.  O2 saturations currently maintained on 3 L nasal cannula oxygen. Cardiovascular: Regular rate and rhythm, no murmurs / rubs / gallops. No extremity edema. 2+ pedal pulses.   Abdomen: no tenderness, no masses palpated.   Bowel sounds positive.  Musculoskeletal: no clubbing / cyanosis. No joint deformity upper and lower extremities.   Skin: no rashes, lesions, ulcers. No induration Neurologic: CN 2-12 grossly intact. Sensation intact, DTR normal. Strength 5/5 in all 4.  Psychiatric: Normal judgment and insight. Alert and oriented x 3. Normal mood.   Data Reviewed:  EKG reveals sinus tachycardia 105 bpm with first-degree heart block and right axis deviation. Assessment and Plan:  Acute respiratory distress Sepsis secondary to healthcare associated pneumonia Acute.  Patient presents with complaints of fever, mostly nonproductive cough, and generalized body aches.  Chest x-ray noted heterogeneous airspace  opacities at the bases of the lungs.  Patient was noted to be tachycardic, tachypneic, and have elevated white blood cell count of 13 meeting SIRS criteria.  Lactic acid was reassuring.  O2 saturations were noted to have drop as low as 89% on room air, currently requiring 3 L to maintain O2 saturation greater than 92%.  Blood cultures had been obtained and patient had been started on empiric antibiotics of Rocephin and azithromycin.  Patient had just recently been hospitalized for pneumonia in June for which suspect this could possibly healthcare associated pneumonia. -Admit to a medical telemetry bed -Continuous pulse oximetry with nasal cannula oxygen to maintain O2 saturation greater than 92% -Incentive spirometry and flutter valve -Follow-up blood cultures -Check MRSA screen, sputum cultures, urine strep, and urine legionella   -Check procalcitonin and D-dimer -Continue empiric antibiotics of Rocephin and azithromycin -Mucinex  Uncontrolled diabetes mellitus type 2, without long-term use of insulin On admission glucose elevated up to 444.  Last hemoglobin A1c was 9.2 in 08/2021.  Home medications include glipizide 5 mg daily. -Hypoglycemia protocols -Hold glipizide -CBGs before every meal and nightly with moderate SSI -Adjust regimen as needed  Essential hypertension associated with diabetes On admission blood pressure was elevated up to 161/69.  Home blood pressure regimen appears to include losartan 50 mg daily -Continue losartan -Hydralazine IV as needed for elevated blood pressures  CKD stage IIIa Chronic.  Creatinine 1.04 with BUN 20 which appears around patient's previous baseline. -Continue to monitor kidney function  Pseudohyponatremia On admission and had been 128, but when corrected for hyperglycemia noted to be within normal limits at 136. -Continue to monitor  Hyperlipidemia associated with diabetes mellitus type 2 -Continue pravastatin  Obesity BMI 30. 28 kg/m.   Patient appears to have been on phentermine last prescribed 8/8 when checked in [facility]DP.  Advance Care Planning:   Code Status: Full Code   Consults: None  Family Communication: Daughter updated over the phone  Severity of Illness: The appropriate patient status for this patient is INPATIENT. Inpatient status is judged to be reasonable and necessary in order to provide the required intensity of service to ensure the patient's safety. The patient's presenting symptoms, physical exam findings, and initial radiographic and laboratory data in the context of their chronic comorbidities is felt to place them at high risk for further clinical deterioration. Furthermore, it is not anticipated that the patient will be medically stable for discharge from the  hospital within 2 midnights of admission.   * I certify that at the point of admission it is my clinical judgment that the patient will require inpatient hospital care spanning beyond 2 midnights from the point of admission due to high intensity of service, high risk for further deterioration and high frequency of surveillance required.*  Author: Norval Morton, MD 11/28/2021 2:13 PM  For on call review [email or web address].

## 2021-11-28 NOTE — ED Notes (Signed)
Pt provided diet ginger ale at this time per pt request.  No acute changes/distress noted.

## 2021-11-28 NOTE — ED Notes (Signed)
Provided pt update. Pt anxious and wanting to get over to [facility], informed her that no bed was available yet. Gave pt granola bar and ice water.

## 2021-11-28 NOTE — ED Notes (Signed)
Pt O2 sats fluctuating between 89-92% on RA; pt placed on 1L O2 via  with improvement to 95%.  Pt denies sob and cp.  Will continue monitor for acute changes and maintain plan of care.

## 2021-11-28 NOTE — ED Notes (Signed)
Reached out to [facility] about bed placement for pt. They stated that they typically want respiratory pts to go to [facility] (vs [facility]). They said they would see if it could be changed, but [facility] is the preferred location. Expressed that pt was anxious and wanted to be moved ASAP.

## 2021-11-29 ENCOUNTER — Inpatient Hospital Stay (HOSPITAL_COMMUNITY): Payer: Medicare Other

## 2021-11-29 DIAGNOSIS — J189 Pneumonia, unspecified organism: Secondary | ICD-10-CM | POA: Diagnosis not present

## 2021-11-29 DIAGNOSIS — E876 Hypokalemia: Secondary | ICD-10-CM

## 2021-11-29 DIAGNOSIS — E1159 Type 2 diabetes mellitus with other circulatory complications: Secondary | ICD-10-CM | POA: Diagnosis not present

## 2021-11-29 DIAGNOSIS — N1831 Chronic kidney disease, stage 3a: Secondary | ICD-10-CM | POA: Diagnosis not present

## 2021-11-29 DIAGNOSIS — E1169 Type 2 diabetes mellitus with other specified complication: Secondary | ICD-10-CM | POA: Diagnosis not present

## 2021-11-29 LAB — BASIC METABOLIC PANEL
Anion gap: 14 (ref 5–15)
BUN: 15 mg/dL (ref 8–23)
CO2: 22 mmol/L (ref 22–32)
Calcium: 8.8 mg/dL — ABNORMAL LOW (ref 8.9–10.3)
Chloride: 96 mmol/L — ABNORMAL LOW (ref 98–111)
Creatinine, Ser: 0.93 mg/dL (ref 0.44–1.00)
GFR, Estimated: >60 mL/min (ref >60)
Glucose, Bld: 251 mg/dL — ABNORMAL HIGH (ref 70–99)
Potassium: 3.3 mmol/L — ABNORMAL LOW (ref 3.5–5.1)
Sodium: 132 mmol/L — ABNORMAL LOW (ref 135–145)

## 2021-11-29 LAB — CBC
HCT: 38.2 % (ref 36.0–46.0)
Hemoglobin: 12.8 g/dL (ref 12.0–15.0)
MCH: 30.5 pg (ref 26.0–34.0)
MCHC: 33.5 g/dL (ref 30.0–36.0)
MCV: 91.2 fL (ref 80.0–100.0)
Platelets: 304 10*3/uL (ref 150–400)
RBC: 4.19 MIL/uL (ref 3.87–5.11)
RDW: 12.4 % (ref 11.5–15.5)
WBC: 12.6 10*3/uL — ABNORMAL HIGH (ref 4.0–10.5)
nRBC: 0 % (ref 0.0–0.2)

## 2021-11-29 LAB — GLUCOSE, CAPILLARY
Glucose-Capillary: 257 mg/dL — ABNORMAL HIGH (ref 70–99)
Glucose-Capillary: 291 mg/dL — ABNORMAL HIGH (ref 70–99)
Glucose-Capillary: 257 mg/dL — ABNORMAL HIGH (ref 70–99)
Glucose-Capillary: 216 mg/dL — ABNORMAL HIGH (ref 70–99)

## 2021-11-29 LAB — MAGNESIUM: Magnesium: 1.9 mg/dL (ref 1.7–2.4)

## 2021-11-29 MED ORDER — POTASSIUM CHLORIDE CRYS ER 10 MEQ PO TBCR
40.0000 meq | EXTENDED_RELEASE_TABLET | Freq: Once | ORAL | Status: AC
  Administered 2021-11-29: 40 meq via ORAL
  Filled 2021-11-29: qty 4

## 2021-11-29 MED ORDER — INSULIN GLARGINE-YFGN 100 UNIT/ML ~~LOC~~ SOLN
10.0000 [IU] | Freq: Every day | SUBCUTANEOUS | Status: DC
  Administered 2021-11-29: 10 [IU] via SUBCUTANEOUS
  Filled 2021-11-29 (×2): qty 0.1

## 2021-11-29 MED ORDER — SODIUM CHLORIDE 0.9 % IV SOLN: INTRAVENOUS | Status: AC

## 2021-11-29 MED ORDER — IOHEXOL 350 MG/ML SOLN
75.0000 mL | Freq: Once | INTRAVENOUS | Status: AC | PRN
  Administered 2021-11-29: 75 mL via INTRAVENOUS

## 2021-11-29 MED ORDER — PANTOPRAZOLE SODIUM 40 MG PO TBEC
40.0000 mg | DELAYED_RELEASE_TABLET | Freq: Every day | ORAL | Status: DC
  Administered 2021-11-29 – 2021-12-02 (×4): 40 mg via ORAL
  Filled 2021-11-29 (×4): qty 1

## 2021-11-29 MED ORDER — FLUTICASONE PROPIONATE 50 MCG/ACT NA SUSP
2.0000 | Freq: Every day | NASAL | Status: DC
  Administered 2021-11-29 – 2021-12-02 (×4): 2 via NASAL
  Filled 2021-11-29: qty 16

## 2021-11-29 MED ORDER — GUAIFENESIN ER 600 MG PO TB12
1200.0000 mg | ORAL_TABLET | Freq: Two times a day (BID) | ORAL | Status: DC
  Administered 2021-11-29 – 2021-12-02 (×7): 1200 mg via ORAL
  Filled 2021-11-29 (×7): qty 2

## 2021-11-29 NOTE — Progress Notes (Signed)
PROGRESS NOTE    Misty Cervantes  PZW:[id number] DOB: 1945-05-03 DOA: 11/27/2021 PCP: Jeanie Sewer, NP    Chief Complaint  Patient presents with   Shortness of Breath    Brief Narrative:  HPI per Dr. Ileene Rubens Misty Cervantes is a 76 y.o. female with medical history significant of hypertension, hyperlipidemia, diabetes mellitus type 2, CKD stage III, osteoarthritis, history of gastric ulcers who presents with complaints of cough and shortness of breath.  Patient had last been hospitalized in 6/10-6/12 for community-acquired pneumonia ultimately discharged home to complete course of cefdinir and azithromycin.  Patient reported that after that hospitalization she felt like she initially got better, but intermittently had noted a cough.  However, over the last 2 weeks she reports the cough which was mostly nonproductive had worsened to the point where she was unable to sleep at night.  She had been taking cough medicine without improvement in symptoms.  Reported feeling short of breath even minimal exertion, fevers elevated up to 101.2 F, generalized weakness, decreased appetite, and pain in all over from coughing.  Denied having any vomiting, or episodes of diarrhea.  Patient has no significant history of tobacco abuse. Her grandkids whom stay with her ages 76 and 67 had urged her to come to the doctor for the last 7-8 days as she had been stuck in the bed not getting up.  They note over the last 2 days she had been slightly confused.   On admission into the emergency department patient was seen to be afebrile with tachycardia, tachypnea, and O2 saturations noted to be as low as 89% with improvement on 3 L of nasal cannula oxygen with O2 saturation greater than 92%.  Labs yesterday significant for WBC 13, sodium 128, BUN 20, creatinine 1.04, glucose 444, anion gap 13 and lactic acid 1.7->1.5.  Chest x-ray noted mild heterogeneous airspace opacity at the bases possibly reflecting atelectasis,  pneumonia, or aspiration.  Influenza and COVID-19 screening were negative.  Patient had been given Rocephin and azithromycin  Assessment & Plan:   Principal Problem:   HCAP (healthcare-associated pneumonia) Active Problems:   Sepsis ([place])   Respiratory distress   Type 2 diabetes mellitus with hyperglycemia, without long-term current use of insulin ([place])   Hypertension associated with diabetes ([place])   Chronic kidney disease   Hyperlipidemia associated with type 2 diabetes mellitus (HCC)   Pneumonia of both lower lobes due to infectious organism   Hypokalemia  #1 acute respiratory distress/sepsis secondary to HCAP, POA -Patient presented with fever, nonproductive cough, generalized body aches, chest x-ray concerning for heterogeneous airspace opacities in the bases of the lungs. -On admission patient noted to be tachycardic, tachypneic, have a leukocytosis meeting criteria for SIRS.  Lactic acid reassuring. -Patient noted to have O2 sats dropped as low as 89% on room air requiring 3 L nasal cannula to make sats greater than 92%. -Patient pancultured and started empirically on Rocephin and azithromycin. -D-dimer obtained elevated. -CT angiogram chest obtained this morning 11/29/2021, negative for PE, no evidence of thoracic aortic dissection, scattered coronary artery calcification, scattered groundglass and alveolar infiltrates seen in both lungs more so on the right lower lobe suggestive of multifocal pneumonia. -Urine Legionella antigen pending.  Urine pneumococcus antigen pending. -1/4 blood cultures with gram variable rods. -MRSA PCR negative. -Sputum Gram stain and culture pending. -SARS coronavirus 2 PCR negative. -Influenza A and B PCR negative. -SLP evaluation. -Continue empiric IV Rocephin, azithromycin. -Increase Mucinex to 1200 mg twice daily. -Supportive care.  2.  Uncontrolled diabetes mellitus type 2, without long-term use of insulin -CBG noted on admission to be  elevated to 444. -Last hemoglobin A1c 9.2 (08/2021) -Continue to hold oral hypoglycemic agents. -CBG noted at 257 this morning. -Start Semglee 10 units daily, SSI. -Consult with diabetes coordinator for diabetes education.  3.  Hypertension -Continue losartan.  4.  CKD stage IIIa -Stable.  5.  Pseudohyponatremia -On admission sodium at 128, correction with hyperglycemia noted to be 136.  6.  Hypokalemia -Replete.  7.  Hyperlipidemia -Continue statin.  8.  Obesity -BMI 30.28 kg/m2. -Per admitting physician patient appears to have been on phentermine last prescribed 8/8 when checked in [facility]. -Lifestyle modification -Outpatient follow-up with PCP.   DVT prophylaxis: Lovenox Code Status: Full Family Communication: Updated patient.  No family at bedside. Disposition: TBD  Status is: Inpatient Remains inpatient appropriate because: Severity of illness   Consultants:  None  Procedures:  CT angiogram chest 11/29/2021 Chest x-ray 11/27/2021   Antimicrobials:  Oral azithromycin 11/28/2021>>>> 12/02/2021 IV azithromycin 11/27/2021 x 1 IV Rocephin 11/27/2021>>>>   Subjective: Patient still feeling unwell, some SOB. Still with cough, no nausea, no emesis.  Asking why she got pneumonia again when she was recently hospitalized for pneumonia.  Objective: Vitals:   11/29/21 0319 11/29/21 0855 11/29/21 1156 11/29/21 1255  BP: 123/72 128/78 120/61 (!) 134/93  Pulse: 83 88 89 78  Resp: (!) '21 20 16 '$ (!) 24  Temp: 98.4 F (36.9 C) 98 F (36.7 C) 98 F (36.7 C)   TempSrc: Oral Oral Oral   SpO2: 98% 91% 95% (!) 77%  Weight:      Height:        Intake/Output Summary (Last 24 hours) at 11/29/2021 1651 Last data filed at 11/29/2021 0938 Gross per 24 hour  Intake 121.22 ml  Output 250 ml  Net -128.78 ml   Filed Weights   11/27/21 2012  Weight: 68 kg    Examination:  General exam: NAD.  Dry mucous membranes. Respiratory system: Bibasilar coarse breath sounds  R > L.   No wheezes.  Fair air movement. Cardiovascular system: S1 & S2 heard, RRR. No JVD, murmurs, rubs, gallops or clicks. No pedal edema. Gastrointestinal system: Abdomen is nondistended, soft and nontender. No organomegaly or masses felt. Normal bowel sounds heard. Central nervous system: Alert and oriented. No focal neurological deficits. Extremities: Symmetric 5 x 5 power. Skin: No rashes, lesions or ulcers Psychiatry: Judgement and insight appear normal. Mood & affect appropriate.     Data Reviewed: I have personally reviewed following labs and imaging studies  CBC: Recent Labs  Lab 11/27/21 2000 11/29/21 0127  WBC 13.0* 12.6*  NEUTROABS 9.0*  --   HGB 14.1 12.8  HCT 41.2 38.2  MCV 90.0 91.2  PLT 353 546    Basic Metabolic Panel: Recent Labs  Lab 11/27/21 2000 11/28/21 1603 11/29/21 0127  NA 128* 131* 132*  K 3.8 3.5 3.3*  CL 91* 102 96*  CO2 '24 24 22  '$ GLUCOSE 444* 318* 251*  BUN '20 14 15  '$ CREATININE 1.04* 0.98 0.93  CALCIUM 9.7 8.1* 8.8*  MG  --   --  1.9    GFR: Estimated Creatinine Clearance: 43.1 mL/min (by C-G formula based on SCr of 0.93 mg/dL).  Liver Function Tests: Recent Labs  Lab 11/27/21 2000  AST 26  ALT 23  ALKPHOS 119  BILITOT 0.8  PROT 7.9  ALBUMIN 4.2    CBG: Recent Labs  Lab 11/28/21 0451  11/28/21 1612 11/28/21 2117 11/29/21 0634 11/29/21 1204  GLUCAP 308* 330* 353* 257* 291*     Recent Results (from the past 240 hour(s))  Resp Panel by RT-PCR (Flu A&B, Covid) Anterior Nasal Swab     Status: None   Collection Time: 11/27/21  8:00 PM   Specimen: Anterior Nasal Swab  Result Value Ref Range Status   SARS Coronavirus 2 by RT PCR NEGATIVE NEGATIVE Final    Comment: (NOTE) SARS-CoV-2 target nucleic acids are NOT DETECTED.  The SARS-CoV-2 RNA is generally detectable in upper respiratory specimens during the acute phase of infection. The lowest concentration of SARS-CoV-2 viral copies this assay can detect is 138 copies/mL. A  negative result does not preclude SARS-Cov-2 infection and should not be used as the sole basis for treatment or other patient management decisions. A negative result may occur with  improper specimen collection/handling, submission of specimen other than nasopharyngeal swab, presence of viral mutation(s) within the areas targeted by this assay, and inadequate number of viral copies(<138 copies/mL). A negative result must be combined with clinical observations, patient history, and epidemiological information. The expected result is Negative.  Fact Sheet for Patients:  EntrepreneurPulse.com.au  Fact Sheet for Healthcare Providers:  IncredibleEmployment.be  This test is no t yet approved or cleared by the [place] FDA and  has been authorized for detection and/or diagnosis of SARS-CoV-2 by FDA under an Emergency Use Authorization (EUA). This EUA will remain  in effect (meaning this test can be used) for the duration of the COVID-19 declaration under Section 564(b)(1) of the Act, 21 U.S.C.section 360bbb-3(b)(1), unless the authorization is terminated  or revoked sooner.       Influenza A by PCR NEGATIVE NEGATIVE Final   Influenza B by PCR NEGATIVE NEGATIVE Final    Comment: (NOTE) The Xpert Xpress SARS-CoV-2/FLU/RSV plus assay is intended as an aid in the diagnosis of influenza from Nasopharyngeal swab specimens and should not be used as a sole basis for treatment. Nasal washings and aspirates are unacceptable for Xpert Xpress SARS-CoV-2/FLU/RSV testing.  Fact Sheet for Patients: EntrepreneurPulse.com.au  Fact Sheet for Healthcare Providers: IncredibleEmployment.be  This test is not yet approved or cleared by the [place] FDA and has been authorized for detection and/or diagnosis of SARS-CoV-2 by FDA under an Emergency Use Authorization (EUA). This EUA will remain in effect (meaning this test can  be used) for the duration of the COVID-19 declaration under Section 564(b)(1) of the Act, 21 U.S.C. section 360bbb-3(b)(1), unless the authorization is terminated or revoked.  Performed at [facility], [address], [place], [place] [address]   Blood culture (routine x 2)     Status: None (Preliminary result)   Collection Time: 11/27/21 10:15 PM   Specimen: BLOOD  Result Value Ref Range Status   Specimen Description   Final    BLOOD RIGHT ANTECUBITAL Performed at [facility], [address] [place], [place] [address]    Special Requests   Final    BOTTLES DRAWN AEROBIC AND ANAEROBIC Blood Culture adequate volume Performed at [facility], [address] [place], [place] [address]    Culture   Final    NO GROWTH < 24 HOURS Performed at [facility], [place] [address], [place], [place] [address]    Report Status PENDING  Incomplete  Blood culture (routine x 2)     Status: Abnormal (Preliminary result)   Collection Time: 11/27/21 10:57 PM   Specimen: BLOOD  Result Value  Ref Range Status   Specimen Description   Final    BLOOD LEFT ARM Performed at [facility], [address] [place], [place] [address]    Special Requests   Final    BOTTLES DRAWN AEROBIC AND ANAEROBIC Blood Culture adequate volume Performed at [facility], [address], [place], [place] [address]    Culture  Setup Time (A)  Final    GRAM VARIABLE ROD AEROBIC BOTTLE ONLY CRITICAL RESULT CALLED TO, READ BACK BY AND VERIFIED WITH: T RUDISILL,PHARMD'@0614'$  11/29/21 [place]    Culture   Final    NO GROWTH < 24 HOURS Performed at [facility], [place] [address], [place], [place] [address]    Report Status PENDING  Incomplete  MRSA Next Gen by PCR, Nasal     Status: None   Collection Time: 11/28/21  4:15 PM  Result Value Ref Range Status   MRSA by PCR Next Gen NOT DETECTED NOT DETECTED Final     Comment: (NOTE) The GeneXpert MRSA Assay (FDA approved for NASAL specimens only), is one component of a comprehensive MRSA colonization surveillance program. It is not intended to diagnose MRSA infection nor to guide or monitor treatment for MRSA infections. Test performance is not FDA approved in patients less than 44 years old. Performed at [facility], [place] [address], [place], [place] [address]          Radiology Studies: CT Angio Chest Pulmonary Embolism (PE) W or WO Contrast  Result Date: 11/29/2021 CLINICAL DATA:  Persistent cough, shortness of breath, positive D-dimer EXAM: CT ANGIOGRAPHY CHEST WITH CONTRAST TECHNIQUE: Multidetector CT imaging of the chest was performed using the standard protocol during bolus administration of intravenous contrast. Multiplanar CT image reconstructions and MIPs were obtained to evaluate the vascular anatomy. RADIATION DOSE REDUCTION: This exam was performed according to the departmental dose-optimization program which includes automated exposure control, adjustment of the mA and/or kV according to patient size and/or use of iterative reconstruction technique. CONTRAST:  9m OMNIPAQUE IOHEXOL 350 MG/ML SOLN COMPARISON:  Previous studies including chest radiographs done on 11/27/2021 and CT done on 08/23/2021 FINDINGS: Cardiovascular: There is no demonstrable intimal flap in the thoracic aorta. Coronary artery calcifications are seen. There are no filling defects in central pulmonary artery branches. Evaluation of small peripheral branches is limited by infiltrates and motion artifacts. Mediastinum/Nodes: No significant lymphadenopathy is seen. Lungs/Pleura: Patchy ground-glass and alveolar infiltrates are seen in both lungs, more so in right lower lobe suggesting multifocal pneumonia. There is no pleural effusion or pneumothorax. Upper Abdomen: No acute findings are seen. Musculoskeletal: Unremarkable. Review of the MIP images confirms the above  findings. IMPRESSION: There is no evidence of pulmonary artery embolism. There is no evidence of thoracic aortic dissection. There are scattered coronary artery calcifications. Scattered ground-glass and alveolar infiltrates are seen in both lungs, more so in the right lower lobes suggesting multifocal pneumonia. Electronically Signed   By: PElmer PickerM.D.   On: 11/29/2021 10:21   DG Chest 2 View  Result Date: 11/27/2021 CLINICAL DATA:  Shortness of breath EXAM: CHEST - 2 VIEW COMPARISON:  08/22/2021, 11/26/2019, CT 2 08/23/2021 FINDINGS: Minimal heterogeneous basilar opacity. Stable cardiomediastinal silhouette. No pneumothorax. Aortic atherosclerosis. IMPRESSION: Mild heterogeneous airspace opacity at the bases could reflect atelectasis, pneumonia or aspiration Electronically Signed   By: KDonavan FoilM.D.   On: 11/27/2021 20:55        Scheduled Meds:  aspirin EC  81 mg Oral Daily  azithromycin  500 mg Oral Daily   enoxaparin (LOVENOX) injection  40 mg Subcutaneous Q24H   fluticasone  2 spray Each Nare Daily   guaiFENesin  1,200 mg Oral BID   insulin aspart  0-15 Units Subcutaneous TID WC   insulin aspart  0-5 Units Subcutaneous QHS   insulin glargine-yfgn  10 Units Subcutaneous Daily   losartan  50 mg Oral Daily   pantoprazole  40 mg Oral Q0600   pravastatin  40 mg Oral QHS   sodium chloride flush  3 mL Intravenous Q12H   Continuous Infusions:  sodium chloride 75 mL/hr at 11/29/21 1057   cefTRIAXone (ROCEPHIN)  IV 2 g (11/28/21 2211)     LOS: 1 day    Time spent: 40 minutes    Irine Seal, MD [facility]   To contact the attending provider between 7A-7P or the covering provider during after hours 7P-7A, please log into the web site [email or web address] and access using universal [place] password for that web site. If you do not have the password, please call the hospital operator.  11/29/2021, 4:51 PM

## 2021-11-29 NOTE — Progress Notes (Signed)
PHARMACY - PHYSICIAN COMMUNICATION CRITICAL VALUE ALERT - BLOOD CULTURE IDENTIFICATION (BCID)  MERIN BORJON is an 76 y.o. female who presented to [facility] on 11/27/2021 with a chief complaint of cough and shortness of breath  Assessment:  Gram stain shows "Gram variable rods" - No BCID identification in 1/4 bottles  Name of physician (or Provider) Contacted: Hal Hope  Current antibiotics: ceftriaxone and azithromycin  Changes to prescribed antibiotics recommended:  Patient is on recommended antibiotics - No changes needed  No results found for this or any previous visit.  Jodean Lima Shaleah Nissley 11/29/2021  6:26 AM

## 2021-11-29 NOTE — Progress Notes (Signed)
Mobility Specialist Progress Note:   11/29/21 1536  Mobility  Activity Ambulated with assistance in room;Transferred to/from [facility]  Level of Assistance Standby assist, set-up cues, supervision of patient - no hands on  Assistive Device BSC  Distance Ambulated (ft) 6 ft  Activity Response Tolerated well  $Mobility charge 1 Mobility   Pt in bed needing to use bathroom. No complaints of pain. Left in bed with call bell in reach and all needs met.   [facility] Surveyor, mining Chat only

## 2021-11-29 NOTE — Evaluation (Signed)
Clinical/Bedside Swallow Evaluation Patient Details  Name: Misty Cervantes MRN: [id number] Date of Birth: 29-Aug-1945  Today's Date: 11/29/2021 Time: SLP Start Time (ACUTE ONLY): 1134 SLP Stop Time (ACUTE ONLY): 1150 SLP Time Calculation (min) (ACUTE ONLY): 16 min  Past Medical History:  Past Medical History:  Diagnosis Date   Chronic kidney disease (CKD), stage III (moderate) ([place])    followed by pcp   Full dentures    History of 2019 novel coronavirus disease (COVID-19) 04/05/2019   result in care everywhere ,  per pt mild symptoms that resolved   History of gastric ulcer 2013   non-bleeding   Hyperlipemia    Mixed stress and urge incontinence    Osteoarthritis    Presence of pessary    Type 2 diabetes mellitus ([place])    followed by pcp   (08-26-2020 per pt checks blood sugar twice weekly, unsure what fasting sugar is)   Vaginal vault 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    gyn--- dr Talbert Nan, using pessary   Wears glasses    Past Surgical History:  Past Surgical History:  Procedure Laterality Date   CARPAL TUNNEL RELEASE Right    1980s   CATARACT EXTRACTION W/ INTRAOCULAR LENS IMPLANT Bilateral 2013   approx   COLONOSCOPY WITH ESOPHAGOGASTRODUODENOSCOPY (EGD)  11/17/2011   COLPOCLEISIS N/A 09/02/2020   Procedure: COLPOCLEISIS, PERINEORRHAPHY;  Surgeon: Salvadore Dom, MD;  Location: [facility];  Service: Gynecology;  Laterality: N/A;   CYSTOSCOPY N/A 09/02/2020   Procedure: CYSTOSCOPY;  Surgeon: Salvadore Dom, MD;  Location: [facility];  Service: Gynecology;  Laterality: N/A;   RETINAL DETACHMENT SURGERY Right 2003   TOTAL HIP ARTHROPLASTY Left 03/02/2010   '@[facility]'$    TOTAL KNEE ARTHROPLASTY Right 11/03/2015   Procedure: RIGHT TOTAL KNEE ARTHROPLASTY;  Surgeon: Gaynelle Arabian, MD;  Location: [facility];  Service: Orthopedics;  Laterality: Right;   TOTAL VAGINAL HYSTERECTOMY  2012   TVH, anterior repair, TVT (no mesh used for the anterior repair)    HPI:  Misty Cervantes is a 76 y.o. female with medical history significant of hypertension, hyperlipidemia, diabetes mellitus type 2, CKD stage III, osteoarthritis, history of gastric ulcers who presents with complaints of cough and shortness of breath. Prior hospitalization 3 months ago for CAP; resolved with abx. No h/o PNA prior to that hospitalization.    Assessment / Plan / Recommendation  Clinical Impression  Pt presents with HCAP and persistent (non-productive) cough; family reports some mild confusion as well. She denies any dysphagia or coughing/choking with intake. She was noted to be coughing intermittently t/o this evaluation; difficult to discern if related to intake. She was also very tired during this evaluation and repeatedly stated she wanted to rest. Oral mech exam was WNL. Cough is strong, but not productive--she reports even being unable to provide a sputum sample. Pt was unable to pass the [employer] Protocol 3 oz challenge, as she could not take consecutive sips. She also had coughing after intake and was noted with decline in SpO2 after sequential sip attempts down to 87% (from baseline 98%). Given this being her second hospitalization d/t PNA in the past three months and questionable s/s aspiration, recommend objective swallow study to be completed prior to discharge. She declined participation today, stating she was too tired and needed to rest. Will hold until tomorrow. Pt may continue oral diet as prior pending results of test.  Pt in agreement with plan.    SLP Visit Diagnosis: Dysphagia, unspecified (R13.10)  Aspiration Risk  Mild aspiration risk    Diet Recommendation Regular;Thin liquid   Liquid Administration via: Straw;Cup Medication Administration: Whole meds with liquid Supervision: Patient able to self feed Postural Changes: Seated upright at 90 degrees    Other  Recommendations Oral Care Recommendations: Oral care BID    Recommendations for follow  up therapy are one component of a multi-disciplinary discharge planning process, led by the attending physician.  Recommendations may be updated based on patient status, additional functional criteria and insurance authorization.  Follow up Recommendations No SLP follow up      Assistance Recommended at Discharge None      Prognosis Prognosis for Safe Diet Advancement: Good      Swallow Study   General Date of Onset: 11/23/21 HPI: Misty Cervantes is a 76 y.o. female with medical history significant of hypertension, hyperlipidemia, diabetes mellitus type 2, CKD stage III, osteoarthritis, history of gastric ulcers who presents with complaints of cough and shortness of breath. Prior hospitalization 3 months ago for CAP; resolved with abx. No h/o PNA prior to that hospitalization. Type of Study: Bedside Swallow Evaluation Previous Swallow Assessment: none Diet Prior to this Study: Regular;Thin liquids Temperature Spikes Noted: Yes Respiratory Status: Room air History of Recent Intubation: No Behavior/Cognition: Cooperative;Alert;Pleasant mood Oral Cavity Assessment: Within Functional Limits Oral Care Completed by SLP: No Oral Cavity - Dentition: Adequate natural dentition Vision: Functional for self-feeding Self-Feeding Abilities: Able to feed self Patient Positioning: Upright in bed Baseline Vocal Quality: Normal Volitional Cough: Strong Volitional Swallow: Able to elicit    Oral/Motor/Sensory Function Overall Oral Motor/Sensory Function: Within functional limits   Thin Liquid Thin Liquid: Within functional limits Presentation: Straw;Cup    Solid     Solid: Within functional limits Presentation: [place]. [name], M.S., CCC-SLP Speech-Language Pathologist Acute Rehabilitation Services Pager: [place] 11/29/2021,11:50 AM

## 2021-11-29 NOTE — Progress Notes (Signed)
   11/29/21 1727  Vitals  Temp 98.5 F (36.9 C)  Temp Source Oral  BP 135/80  MAP (mmHg) 96  BP Location Right Arm  BP Method Automatic  Patient Position (if appropriate) Lying  Pulse Rate 74  Pulse Rate Source Monitor  ECG Heart Rate 80  Resp 20  MEWS COLOR  MEWS Score Color Green  Oxygen Therapy  SpO2 97 %  O2 Device Room Air  MEWS Score  MEWS Temp 0  MEWS Systolic 0  MEWS Pulse 0  MEWS RR 0  MEWS LOC 0  MEWS Score 0   Pt ambulated 470 feet Stand by assistance Pt tolerated well

## 2021-11-29 NOTE — Progress Notes (Signed)
   11/29/21 1255  Vitals  BP (!) 134/93  MAP (mmHg) 106  Pulse Rate 78  ECG Heart Rate (!) 104  Resp (!) 24  MEWS COLOR  MEWS Score Color Yellow  Oxygen Therapy  SpO2 (!) 77 %  MEWS Score  MEWS Temp 0  MEWS Systolic 0  MEWS Pulse 1  MEWS RR 1  MEWS LOC 0  MEWS Score 2   Pt ambulated 270 ft with stand by assistance. Pt tolerated well

## 2021-11-30 ENCOUNTER — Inpatient Hospital Stay (HOSPITAL_COMMUNITY): Payer: Medicare Other

## 2021-11-30 DIAGNOSIS — E1159 Type 2 diabetes mellitus with other circulatory complications: Secondary | ICD-10-CM | POA: Diagnosis not present

## 2021-11-30 DIAGNOSIS — N1831 Chronic kidney disease, stage 3a: Secondary | ICD-10-CM | POA: Diagnosis not present

## 2021-11-30 DIAGNOSIS — E1169 Type 2 diabetes mellitus with other specified complication: Secondary | ICD-10-CM | POA: Diagnosis not present

## 2021-11-30 DIAGNOSIS — J189 Pneumonia, unspecified organism: Secondary | ICD-10-CM | POA: Diagnosis not present

## 2021-11-30 LAB — CBC WITH DIFFERENTIAL/PLATELET
Abs Immature Granulocytes: 0.36 10*3/uL — ABNORMAL HIGH (ref 0.00–0.07)
Basophils Absolute: 0.1 10*3/uL (ref 0.0–0.1)
Basophils Relative: 0 %
Eosinophils Absolute: 0.1 10*3/uL (ref 0.0–0.5)
Eosinophils Relative: 1 %
HCT: 36.9 % (ref 36.0–46.0)
Hemoglobin: 12.6 g/dL (ref 12.0–15.0)
Immature Granulocytes: 3 %
Lymphocytes Relative: 10 %
Lymphs Abs: 1.5 10*3/uL (ref 0.7–4.0)
MCH: 31 pg (ref 26.0–34.0)
MCHC: 34.1 g/dL (ref 30.0–36.0)
MCV: 90.7 fL (ref 80.0–100.0)
Monocytes Absolute: 1.1 10*3/uL — ABNORMAL HIGH (ref 0.1–1.0)
Monocytes Relative: 8 %
Neutro Abs: 11.2 10*3/uL — ABNORMAL HIGH (ref 1.7–7.7)
Neutrophils Relative %: 78 %
Platelets: 327 10*3/uL (ref 150–400)
RBC: 4.07 MIL/uL (ref 3.87–5.11)
RDW: 12.3 % (ref 11.5–15.5)
WBC: 14.3 10*3/uL — ABNORMAL HIGH (ref 4.0–10.5)
nRBC: 0 % (ref 0.0–0.2)

## 2021-11-30 LAB — BASIC METABOLIC PANEL
Anion gap: 11 (ref 5–15)
BUN: 11 mg/dL (ref 8–23)
CO2: 22 mmol/L (ref 22–32)
Calcium: 8.7 mg/dL — ABNORMAL LOW (ref 8.9–10.3)
Chloride: 102 mmol/L (ref 98–111)
Creatinine, Ser: 0.94 mg/dL (ref 0.44–1.00)
GFR, Estimated: >60 mL/min (ref >60)
Glucose, Bld: 201 mg/dL — ABNORMAL HIGH (ref 70–99)
Potassium: 4.1 mmol/L (ref 3.5–5.1)
Sodium: 135 mmol/L (ref 135–145)

## 2021-11-30 LAB — GLUCOSE, CAPILLARY
Glucose-Capillary: 222 mg/dL — ABNORMAL HIGH (ref 70–99)
Glucose-Capillary: 318 mg/dL — ABNORMAL HIGH (ref 70–99)
Glucose-Capillary: 261 mg/dL — ABNORMAL HIGH (ref 70–99)
Glucose-Capillary: 250 mg/dL — ABNORMAL HIGH (ref 70–99)

## 2021-11-30 LAB — MAGNESIUM: Magnesium: 1.7 mg/dL (ref 1.7–2.4)

## 2021-11-30 MED ORDER — SODIUM CHLORIDE 0.9% FLUSH
10.0000 mL | Freq: Two times a day (BID) | INTRAVENOUS | Status: DC
  Administered 2021-11-30 – 2021-12-02 (×4): 10 mL

## 2021-11-30 MED ORDER — INSULIN GLARGINE-YFGN 100 UNIT/ML ~~LOC~~ SOLN
4.0000 [IU] | Freq: Once | SUBCUTANEOUS | Status: AC
  Administered 2021-11-30: 4 [IU] via SUBCUTANEOUS
  Filled 2021-11-30: qty 0.04

## 2021-11-30 MED ORDER — INSULIN GLARGINE-YFGN 100 UNIT/ML ~~LOC~~ SOLN
18.0000 [IU] | Freq: Every day | SUBCUTANEOUS | Status: DC
  Filled 2021-11-30: qty 0.18

## 2021-11-30 MED ORDER — INSULIN GLARGINE-YFGN 100 UNIT/ML ~~LOC~~ SOLN
14.0000 [IU] | Freq: Every day | SUBCUTANEOUS | Status: DC
  Administered 2021-11-30: 14 [IU] via SUBCUTANEOUS
  Filled 2021-11-30: qty 0.14

## 2021-11-30 MED ORDER — SODIUM CHLORIDE 0.9% FLUSH: 10.0000 mL | INTRAVENOUS | Status: DC | PRN

## 2021-11-30 NOTE — Progress Notes (Signed)
PT Cancellation Note/ screen  Patient Details Name: Misty Cervantes MRN: [id number] DOB: 02/12/1946   Cancelled Treatment:    Reason Eval/Treat Not Completed: PT screened, no needs identified, will sign off (pt reports moving independently, no SOB and walked 400' without assist with mobility earlier today. Pt declined needing therapy eval. Will screen. Please reorder if pt status changes)   [name] [name] 11/30/2021, 1:09 PM [name], [place] Office: [phone]

## 2021-11-30 NOTE — Progress Notes (Signed)
PROGRESS NOTE    Misty Cervantes  JME:[id number] DOB: 07-Mar-1946 DOA: 11/27/2021 PCP: Jeanie Sewer, NP    Chief Complaint  Patient presents with   Shortness of Breath    Brief Narrative:  HPI per Dr. Ileene Rubens Misty Cervantes is a 76 y.o. female with medical history significant of hypertension, hyperlipidemia, diabetes mellitus type 2, CKD stage III, osteoarthritis, history of gastric ulcers who presents with complaints of cough and shortness of breath.  Patient had last been hospitalized in 6/10-6/12 for community-acquired pneumonia ultimately discharged home to complete course of cefdinir and azithromycin.  Patient reported that after that hospitalization she felt like she initially got better, but intermittently had noted a cough.  However, over the last 2 weeks she reports the cough which was mostly nonproductive had worsened to the point where she was unable to sleep at night.  She had been taking cough medicine without improvement in symptoms.  Reported feeling short of breath even minimal exertion, fevers elevated up to 101.2 F, generalized weakness, decreased appetite, and pain in all over from coughing.  Denied having any vomiting, or episodes of diarrhea.  Patient has no significant history of tobacco abuse. Her grandkids whom stay with her ages 41 and 66 had urged her to come to the doctor for the last 7-8 days as she had been stuck in the bed not getting up.  They note over the last 2 days she had been slightly confused.   On admission into the emergency department patient was seen to be afebrile with tachycardia, tachypnea, and O2 saturations noted to be as low as 89% with improvement on 3 L of nasal cannula oxygen with O2 saturation greater than 92%.  Labs yesterday significant for WBC 13, sodium 128, BUN 20, creatinine 1.04, glucose 444, anion gap 13 and lactic acid 1.7->1.5.  Chest x-ray noted mild heterogeneous airspace opacity at the bases possibly reflecting atelectasis,  pneumonia, or aspiration.  Influenza and COVID-19 screening were negative.  Patient had been given Rocephin and azithromycin  Assessment & Plan:   Principal Problem:   HCAP (healthcare-associated pneumonia) Active Problems:   Sepsis ([place])   Respiratory distress   Type 2 diabetes mellitus with hyperglycemia, without long-term current use of insulin ([place])   Hypertension associated with diabetes ([place])   Chronic kidney disease   Hyperlipidemia associated with type 2 diabetes mellitus (HCC)   Pneumonia of both lower lobes due to infectious organism   Hypokalemia  #1 acute respiratory distress/sepsis secondary to HCAP, POA -Patient presented with fever, nonproductive cough, generalized body aches, chest x-ray concerning for heterogeneous airspace opacities in the bases of the lungs. -On admission patient noted to be tachycardic, tachypneic, have a leukocytosis meeting criteria for SIRS.  Lactic acid reassuring. -Patient noted to have O2 sats dropped as low as 89% on room air requiring 3 L nasal cannula to make sats greater than 92%. -Patient pancultured and started empirically on Rocephin and azithromycin. -D-dimer obtained elevated. -CT angiogram chest obtained, 11/29/2021, negative for PE, no evidence of thoracic aortic dissection, scattered coronary artery calcification, scattered groundglass and alveolar infiltrates seen in both lungs more so on the right lower lobe suggestive of multifocal pneumonia. -Urine Legionella antigen pending.  Urine pneumococcus antigen pending. -1/4 blood cultures with gram variable rods. -MRSA PCR negative. -Sputum Gram stain and culture pending. -SARS coronavirus 2 PCR negative. -Influenza A and B PCR negative. -SLP ff and patient underwent MBS today. -Continue empiric IV Rocephin, azithromycin. -If continued improvement could likely transition  to oral antibiotics in the next 24 to 48 hours. -Continue Mucinex 1200 mg twice daily. -Supportive care.  2.   Uncontrolled diabetes mellitus type 2, without long-term use of insulin -CBG noted on admission to be elevated to 444 initially on presentation. -Last hemoglobin A1c 9.2 (08/2021) -Repeat hemoglobin A1c. -Continue to hold oral hypoglycemic agents. -CBG noted at 222 this morning. -Increase Semglee to 18 units daily.  SSI.  -Diabetes coordinator following. -Consult with diabetes coordinator for diabetes education.  3.  Hypertension -Continue losartan.  4.  CKD stage IIIa -Stable.  5.  Pseudohyponatremia -On admission sodium at 128, correction with hyperglycemia noted to be 136. -Sodium at 135.  6.  Hypokalemia -Repleted.  Potassium of 4.1.   -Follow.  7.  Hyperlipidemia -Statin.    8.  Obesity -BMI 30.28 kg/m2. -Per admitting physician patient appears to have been on phentermine last prescribed 8/8 when checked in PMDP. -Lifestyle modification -Outpatient follow-up with PCP.   DVT prophylaxis: Lovenox Code Status: Full Family Communication: Updated patient.  No family at bedside. Disposition: TBD  Status is: Inpatient Remains inpatient appropriate because: Severity of illness   Consultants:  None  Procedures:  CT angiogram chest 11/29/2021 Chest x-ray 11/27/2021   Antimicrobials:  Oral azithromycin 11/28/2021>>>> 12/02/2021 IV azithromycin 11/27/2021 x 1 IV Rocephin 11/27/2021>>>>   Subjective: Sleeping but arousable.  More alert today.  Overall states feeling better.  Shortness of breath, cough improving.  No nausea or vomiting.    Objective: Vitals:   11/30/21 0057 11/30/21 0335 11/30/21 0602 11/30/21 0805  BP: (!) 119/58 129/77  138/83  Pulse: 90 80  (!) 105  Resp:  17  20  Temp: 99.2 F (37.3 C) 99.2 F (37.3 C)  98.4 F (36.9 C)  TempSrc: Oral Oral  Oral  SpO2: 95% 98%  95%  Weight:   69.3 kg   Height:        Intake/Output Summary (Last 24 hours) at 11/30/2021 1136 Last data filed at 11/30/2021 0643 Gross per 24 hour  Intake 1554.59 ml  Output  200 ml  Net 1354.59 ml    Filed Weights   11/27/21 2012 11/30/21 0602  Weight: 68 kg 69.3 kg    Examination:  General exam: More alert today.  Dry mucous membranes. Respiratory system: Decreased bibasilar coarse breath sounds R > L.   Fair air movement. Cardiovascular system: Tachycardic, no murmurs rubs or gallops.  No JVD.  No lower extremity edema.  Gastrointestinal system: Abdomen is soft, nontender, nondistended, positive bowel sounds.  No rebound.  No guarding.  Central nervous system: Alert and oriented. No focal neurological deficits. Extremities: Symmetric 5 x 5 power. Skin: No rashes, lesions or ulcers Psychiatry: Judgement and insight appear normal. Mood & affect appropriate.     Data Reviewed: I have personally reviewed following labs and imaging studies  CBC: Recent Labs  Lab 11/27/21 2000 11/29/21 0127 11/30/21 0229  WBC 13.0* 12.6* 14.3*  NEUTROABS 9.0*  --  11.2*  HGB 14.1 12.8 12.6  HCT 41.2 38.2 36.9  MCV 90.0 91.2 90.7  PLT 353 304 327     Basic Metabolic Panel: Recent Labs  Lab 11/27/21 2000 11/28/21 1603 11/29/21 0127 11/30/21 0229  NA 128* 131* 132* 135  K 3.8 3.5 3.3* 4.1  CL 91* 102 96* 102  CO2 '24 24 22 22  '$ GLUCOSE 444* 318* 251* 201*  BUN '20 14 15 11  '$ CREATININE 1.04* 0.98 0.93 0.94  CALCIUM 9.7 8.1* 8.8* 8.7*  MG  --   --  1.9 1.7     GFR: Estimated Creatinine Clearance: 43.1 mL/min (by C-G formula based on SCr of 0.94 mg/dL).  Liver Function Tests: Recent Labs  Lab 11/27/21 2000  AST 26  ALT 23  ALKPHOS 119  BILITOT 0.8  PROT 7.9  ALBUMIN 4.2     CBG: Recent Labs  Lab 11/29/21 0634 11/29/21 1204 11/29/21 1723 11/29/21 2117 11/30/21 0621  GLUCAP 257* 291* 257* 216* 222*      Recent Results (from the past 240 hour(s))  Resp Panel by RT-PCR (Flu A&B, Covid) Anterior Nasal Swab     Status: None   Collection Time: 11/27/21  8:00 PM   Specimen: Anterior Nasal Swab  Result Value Ref Range Status   SARS  Coronavirus 2 by RT PCR NEGATIVE NEGATIVE Final    Comment: (NOTE) SARS-CoV-2 target nucleic acids are NOT DETECTED.  The SARS-CoV-2 RNA is generally detectable in upper respiratory specimens during the acute phase of infection. The lowest concentration of SARS-CoV-2 viral copies this assay can detect is 138 copies/mL. A negative result does not preclude SARS-Cov-2 infection and should not be used as the sole basis for treatment or other patient management decisions. A negative result may occur with  improper specimen collection/handling, submission of specimen other than nasopharyngeal swab, presence of viral mutation(s) within the areas targeted by this assay, and inadequate number of viral copies(<138 copies/mL). A negative result must be combined with clinical observations, patient history, and epidemiological information. The expected result is Negative.  Fact Sheet for Patients:  EntrepreneurPulse.com.au  Fact Sheet for Healthcare Providers:  IncredibleEmployment.be  This test is no t yet approved or cleared by the [place] FDA and  has been authorized for detection and/or diagnosis of SARS-CoV-2 by FDA under an Emergency Use Authorization (EUA). This EUA will remain  in effect (meaning this test can be used) for the duration of the COVID-19 declaration under Section 564(b)(1) of the Act, 21 U.S.C.section 360bbb-3(b)(1), unless the authorization is terminated  or revoked sooner.       Influenza A by PCR NEGATIVE NEGATIVE Final   Influenza B by PCR NEGATIVE NEGATIVE Final    Comment: (NOTE) The Xpert Xpress SARS-CoV-2/FLU/RSV plus assay is intended as an aid in the diagnosis of influenza from Nasopharyngeal swab specimens and should not be used as a sole basis for treatment. Nasal washings and aspirates are unacceptable for Xpert Xpress SARS-CoV-2/FLU/RSV testing.  Fact Sheet for  Patients: EntrepreneurPulse.com.au  Fact Sheet for Healthcare Providers: IncredibleEmployment.be  This test is not yet approved or cleared by the [place] FDA and has been authorized for detection and/or diagnosis of SARS-CoV-2 by FDA under an Emergency Use Authorization (EUA). This EUA will remain in effect (meaning this test can be used) for the duration of the COVID-19 declaration under Section 564(b)(1) of the Act, 21 U.S.C. section 360bbb-3(b)(1), unless the authorization is terminated or revoked.  Performed at [facility], [address] [place], [place] [address]   Blood culture (routine x 2)     Status: None (Preliminary result)   Collection Time: 11/27/21 10:15 PM   Specimen: BLOOD  Result Value Ref Range Status   Specimen Description   Final    BLOOD RIGHT ANTECUBITAL Performed at [facility], [address] [place], [place] [address]    Special Requests   Final    BOTTLES DRAWN AEROBIC AND ANAEROBIC Blood Culture adequate volume Performed at [facility], [address] [place], [place] [address]  Culture   Final    NO GROWTH 2 DAYS Performed at [facility], [place] [address], [place], [place] [address]    Report Status PENDING  Incomplete  Blood culture (routine x 2)     Status: Abnormal (Preliminary result)   Collection Time: 11/27/21 10:57 PM   Specimen: BLOOD  Result Value Ref Range Status   Specimen Description   Final    BLOOD LEFT ARM Performed at [facility], [address] [place], [place] [address]    Special Requests   Final    BOTTLES DRAWN AEROBIC AND ANAEROBIC Blood Culture adequate volume Performed at [facility], [address], [place], [place] [address]    Culture  Setup Time (A)  Final    GRAM VARIABLE ROD AEROBIC BOTTLE ONLY CRITICAL RESULT CALLED TO, READ BACK BY AND VERIFIED WITH: T  RUDISILL,PHARMD'@0614'$  11/29/21 [place]    Culture   Final    GRAM POSITIVE RODS IDENTIFICATION TO FOLLOW Performed at [facility], [place] [address], [place], [place] [address]    Report Status PENDING  Incomplete  MRSA Next Gen by PCR, Nasal     Status: None   Collection Time: 11/28/21  4:15 PM  Result Value Ref Range Status   MRSA by PCR Next Gen NOT DETECTED NOT DETECTED Final    Comment: (NOTE) The GeneXpert MRSA Assay (FDA approved for NASAL specimens only), is one component of a comprehensive MRSA colonization surveillance program. It is not intended to diagnose MRSA infection nor to guide or monitor treatment for MRSA infections. Test performance is not FDA approved in patients less than 77 years old. Performed at [facility], [place] [address] [place], [place] [address]          Radiology Studies: DG Swallowing Func-Speech Pathology  Result Date: 11/30/2021 Table formatting from the original result was not included. Images from the original result were not included. Objective Swallowing Evaluation: Type of Study: MBS-Modified Barium Swallow Study  Patient Details Name: Misty Cervantes MRN: [id number] Date of Birth: 09-Mar-1946 Today's Date: 11/30/2021 Time: SLP Start Time (ACUTE ONLY): 0930 -SLP Stop Time (ACUTE ONLY): 1000 SLP Time Calculation (min) (ACUTE ONLY): 30 min Past Medical History: Past Medical History: Diagnosis Date  Chronic kidney disease (CKD), stage III (moderate) ([place])   followed by pcp  Full dentures   History of 2019 novel coronavirus disease (COVID-19) 04/05/2019  result in care everywhere ,  per pt mild symptoms that resolved  History of gastric ulcer 2013  non-bleeding  Hyperlipemia   Mixed stress and urge incontinence   Osteoarthritis   Presence of pessary   Type 2 diabetes mellitus ([place])   followed by pcp   (08-26-2020 per pt checks blood sugar twice weekly, unsure what fasting sugar is)  Vaginal vault 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   gyn--- dr Talbert Nan,  using pessary  Wears glasses  Past Surgical History: Past Surgical History: Procedure Laterality Date  CARPAL TUNNEL RELEASE Right   1980s  CATARACT EXTRACTION W/ INTRAOCULAR LENS IMPLANT Bilateral 2013  approx  COLONOSCOPY WITH ESOPHAGOGASTRODUODENOSCOPY (EGD)  11/17/2011  COLPOCLEISIS N/A 09/02/2020  Procedure: COLPOCLEISIS, PERINEORRHAPHY;  Surgeon: Salvadore Dom, MD;  Location: [facility];  Service: Gynecology;  Laterality: N/A;  CYSTOSCOPY N/A 09/02/2020  Procedure: CYSTOSCOPY;  Surgeon: Salvadore Dom, MD;  Location: [facility];  Service: Gynecology;  Laterality: N/A;  RETINAL DETACHMENT SURGERY Right 2003  TOTAL HIP ARTHROPLASTY Left 03/02/2010  '@[facility]'$   TOTAL KNEE ARTHROPLASTY Right 11/03/2015  Procedure: RIGHT TOTAL KNEE ARTHROPLASTY;  Surgeon: Gaynelle Arabian, MD;  Location: [facility];  Service: Orthopedics;  Laterality: Right;  TOTAL VAGINAL HYSTERECTOMY  2012  TVH, anterior repair, TVT (no mesh used for the anterior repair) HPI: Misty Cervantes is a 76 y.o. female with medical history significant of hypertension, hyperlipidemia, diabetes mellitus type 2, CKD stage III, osteoarthritis, history of gastric ulcers who presents with complaints of cough and shortness of breath. Prior hospitalization 3 months ago for CAP; resolved with abx. No h/o PNA prior to that hospitalization.  Subjective: Pt denies dysphagia; lying down drinking from straw upon SLP arrival.  Recommendations for follow up therapy are one component of a multi-disciplinary discharge planning process, led by the attending physician.  Recommendations may be updated based on patient status, additional functional criteria and insurance authorization. Assessment / Plan / Recommendation   11/30/2021  10:00 AM Clinical Impressions Clinical Impression Pt demonstrates normal swallowing function. She is slightly drowsy during assessment, but was able to consume all texures without impairment. Pt to continue regular  diet and thin liquids. Will sign off. Impact on safety and function Mild aspiration risk     11/29/2021  11:48 AM Treatment Recommendations Treatment Recommendations F/U MBS in ___ days (Comment)     11/29/2021  11:48 AM Prognosis Prognosis for Safe Diet Advancement Good   11/30/2021  10:00 AM Diet Recommendations SLP Diet Recommendations Regular solids;Thin liquid Liquid Administration via Cup;Straw Medication Administration Whole meds with liquid     11/30/2021  10:00 AM Other Recommendations Follow Up Recommendations No SLP follow up    No data to display        11/30/2021  10:00 AM Oral Phase Oral Phase [facility]    11/30/2021  10:00 AM Pharyngeal Phase Pharyngeal Phase [facility]    11/30/2021  10:00 AM Cervical Esophageal Phase  Cervical Esophageal Phase [facility], Katherene Ponto 11/30/2021, 10:33 AM                     CT Angio Chest Pulmonary Embolism (PE) W or WO Contrast  Result Date: 11/29/2021 CLINICAL DATA:  Persistent cough, shortness of breath, positive D-dimer EXAM: CT ANGIOGRAPHY CHEST WITH CONTRAST TECHNIQUE: Multidetector CT imaging of the chest was performed using the standard protocol during bolus administration of intravenous contrast. Multiplanar CT image reconstructions and MIPs were obtained to evaluate the vascular anatomy. RADIATION DOSE REDUCTION: This exam was performed according to the departmental dose-optimization program which includes automated exposure control, adjustment of the mA and/or kV according to patient size and/or use of iterative reconstruction technique. CONTRAST:  32m OMNIPAQUE IOHEXOL 350 MG/ML SOLN COMPARISON:  Previous studies including chest radiographs done on 11/27/2021 and CT done on 08/23/2021 FINDINGS: Cardiovascular: There is no demonstrable intimal flap in the thoracic aorta. Coronary artery calcifications are seen. There are no filling defects in central pulmonary artery branches. Evaluation of small peripheral branches is limited by infiltrates and motion artifacts.  Mediastinum/Nodes: No significant lymphadenopathy is seen. Lungs/Pleura: Patchy ground-glass and alveolar infiltrates are seen in both lungs, more so in right lower lobe suggesting multifocal pneumonia. There is no pleural effusion or pneumothorax. Upper Abdomen: No acute findings are seen. Musculoskeletal: Unremarkable. Review of the MIP images confirms the above findings. IMPRESSION: There is no evidence of pulmonary artery embolism. There is no evidence of thoracic aortic dissection. There are scattered coronary artery calcifications. Scattered ground-glass and alveolar infiltrates are seen in both lungs, more so in the right  lower lobes suggesting multifocal pneumonia. Electronically Signed   By: Elmer Picker M.D.   On: 11/29/2021 10:21        Scheduled Meds:  aspirin EC  81 mg Oral Daily   azithromycin  500 mg Oral Daily   enoxaparin (LOVENOX) injection  40 mg Subcutaneous Q24H   fluticasone  2 spray Each Nare Daily   guaiFENesin  1,200 mg Oral BID   insulin aspart  0-15 Units Subcutaneous TID WC   insulin aspart  0-5 Units Subcutaneous QHS   insulin glargine-yfgn  14 Units Subcutaneous Daily   losartan  50 mg Oral Daily   pantoprazole  40 mg Oral Q0600   pravastatin  40 mg Oral QHS   sodium chloride flush  3 mL Intravenous Q12H   Continuous Infusions:  cefTRIAXone (ROCEPHIN)  IV 2 g (11/29/21 2150)     LOS: 2 days    Time spent: 40 minutes    Irine Seal, MD [facility]   To contact the attending provider between 7A-7P or the covering provider during after hours 7P-7A, please log into the web site [email or web address] and access using universal  password for that web site. If you do not have the password, please call the hospital operator.  11/30/2021, 11:36 AM

## 2021-11-30 NOTE — Progress Notes (Signed)
Mobility Specialist Progress Note:   11/30/21 1151  Mobility  Activity Ambulated with assistance in hallway  Level of Assistance Standby assist, set-up cues, supervision of patient - no hands on  Assistive Device None  Distance Ambulated (ft) 400 ft  Activity Response Tolerated well  $Mobility charge 1 Mobility   Pt received in bed willing to participate in mobility. No complaints of pain. Left in bed with call bell in reach and all needs met.   [facility] Surveyor, mining Chat only

## 2021-11-30 NOTE — Plan of Care (Signed)
  Problem: Activity: Goal: Ability to tolerate increased activity will improve Outcome: Progressing   Problem: Clinical Measurements: Goal: Ability to maintain a body temperature in the normal range will improve Outcome: Progressing   Problem: Respiratory: Goal: Ability to maintain a clear airway will improve Outcome: Progressing   Problem: Fluid Volume: Goal: Ability to maintain a balanced intake and output will improve Outcome: Progressing

## 2021-11-30 NOTE — Inpatient Diabetes Management (Signed)
Inpatient Diabetes Program Recommendations  AACE/ADA: New Consensus Statement on Inpatient Glycemic Control (2015)  Target Ranges:  Prepandial:   less than 140 mg/dL      Peak postprandial:   less than 180 mg/dL (1-2 hours)      Critically ill patients:  140 - 180 mg/dL   Lab Results  Component Value Date   GLUCAP 318 (H) 11/30/2021   HGBA1C 9.2 (H) 08/23/2021    Review of Glycemic Control  Latest Reference Range & Units 11/29/21 17:23 11/29/21 21:17 11/30/21 06:21 11/30/21 11:57  Glucose-Capillary 70 - 99 mg/dL 257 (H) 216 (H) 222 (H) 318 (H)  (H): Data is abnormally high Diabetes history: Type 2 DM Outpatient Diabetes medications: Glipizide 5 mg QA Current orders for Inpatient glycemic control: Semglee 14 units QD, Novolog 0-15 units TID & HS  Inpatient Diabetes Program Recommendations:    Consider: - increasing Semglee 18 units QD - Adding A1C as previous from 08/2021?   Thanks, Bronson Curb, MSN, RNC-OB Diabetes Coordinator [phone] (8a-5p)

## 2021-11-30 NOTE — Progress Notes (Signed)
Modified Barium Swallow Progress Note  Patient Details  Name: Misty Cervantes MRN: [id number] Date of Birth: [birth year]  Today's Date: 11/30/2021  Modified Barium Swallow completed.  Full report located under Chart Review in the Imaging Section.  Brief recommendations include the following:  Clinical Impression  Pt demonstrates normal swallowing function. She is slightly drowsy during assessment, but was able to consume all texures without impairment. Pt to continue regular diet and thin liquids. Will sign off.   Swallow Evaluation Recommendations       SLP Diet Recommendations: Regular solids;Thin liquid   Liquid Administration via: Cup;Straw   Medication Administration: Whole meds with liquid                        Shamera Yarberry, Katherene Ponto 11/30/2021,10:31 AM

## 2021-11-30 NOTE — Progress Notes (Signed)
Nurse assessed pt wheezing. Pt states feeling SHOB and like she is "gasping for air". Admin PRN nebulizer tx. See MAR.  Raelyn Number, RN

## 2021-12-01 ENCOUNTER — Inpatient Hospital Stay (HOSPITAL_COMMUNITY): Payer: Medicare Other

## 2021-12-01 DIAGNOSIS — E1159 Type 2 diabetes mellitus with other circulatory complications: Secondary | ICD-10-CM | POA: Diagnosis not present

## 2021-12-01 DIAGNOSIS — E1169 Type 2 diabetes mellitus with other specified complication: Secondary | ICD-10-CM | POA: Diagnosis not present

## 2021-12-01 DIAGNOSIS — N1831 Chronic kidney disease, stage 3a: Secondary | ICD-10-CM | POA: Diagnosis not present

## 2021-12-01 DIAGNOSIS — J189 Pneumonia, unspecified organism: Secondary | ICD-10-CM | POA: Diagnosis not present

## 2021-12-01 LAB — CBC WITH DIFFERENTIAL/PLATELET
Abs Immature Granulocytes: 0.3 10*3/uL — ABNORMAL HIGH (ref 0.00–0.07)
Basophils Absolute: 0.1 10*3/uL (ref 0.0–0.1)
Basophils Relative: 1 %
Eosinophils Absolute: 0.2 10*3/uL (ref 0.0–0.5)
Eosinophils Relative: 1 %
HCT: 35.6 % — ABNORMAL LOW (ref 36.0–46.0)
Hemoglobin: 12 g/dL (ref 12.0–15.0)
Immature Granulocytes: 3 %
Lymphocytes Relative: 14 %
Lymphs Abs: 1.6 10*3/uL (ref 0.7–4.0)
MCH: 30.9 pg (ref 26.0–34.0)
MCHC: 33.7 g/dL (ref 30.0–36.0)
MCV: 91.8 fL (ref 80.0–100.0)
Monocytes Absolute: 0.8 10*3/uL (ref 0.1–1.0)
Monocytes Relative: 8 %
Neutro Abs: 8 10*3/uL — ABNORMAL HIGH (ref 1.7–7.7)
Neutrophils Relative %: 73 %
Platelets: 345 10*3/uL (ref 150–400)
RBC: 3.88 MIL/uL (ref 3.87–5.11)
RDW: 12.5 % (ref 11.5–15.5)
WBC: 10.9 10*3/uL — ABNORMAL HIGH (ref 4.0–10.5)
nRBC: 0 % (ref 0.0–0.2)

## 2021-12-01 LAB — BASIC METABOLIC PANEL
Anion gap: 9 (ref 5–15)
BUN: 10 mg/dL (ref 8–23)
CO2: 23 mmol/L (ref 22–32)
Calcium: 8.6 mg/dL — ABNORMAL LOW (ref 8.9–10.3)
Chloride: 101 mmol/L (ref 98–111)
Creatinine, Ser: 0.92 mg/dL (ref 0.44–1.00)
GFR, Estimated: >60 mL/min (ref >60)
Glucose, Bld: 265 mg/dL — ABNORMAL HIGH (ref 70–99)
Potassium: 3.4 mmol/L — ABNORMAL LOW (ref 3.5–5.1)
Sodium: 133 mmol/L — ABNORMAL LOW (ref 135–145)

## 2021-12-01 LAB — CULTURE, BLOOD (ROUTINE X 2): Special Requests: ADEQUATE

## 2021-12-01 LAB — GLUCOSE, CAPILLARY
Glucose-Capillary: 253 mg/dL — ABNORMAL HIGH (ref 70–99)
Glucose-Capillary: 239 mg/dL — ABNORMAL HIGH (ref 70–99)
Glucose-Capillary: 281 mg/dL — ABNORMAL HIGH (ref 70–99)
Glucose-Capillary: 279 mg/dL — ABNORMAL HIGH (ref 70–99)

## 2021-12-01 LAB — BRAIN NATRIURETIC PEPTIDE: B Natriuretic Peptide: 26.6 pg/mL (ref 0.0–100.0)

## 2021-12-01 LAB — MAGNESIUM: Magnesium: 1.8 mg/dL (ref 1.7–2.4)

## 2021-12-01 LAB — HEMOGLOBIN A1C
Hgb A1c MFr Bld: 9.3 % — ABNORMAL HIGH (ref 4.8–5.6)
Mean Plasma Glucose: 220.21 mg/dL

## 2021-12-01 MED ORDER — MAGNESIUM SULFATE 2 GM/50ML IV SOLN
2.0000 g | Freq: Once | INTRAVENOUS | Status: AC
  Administered 2021-12-01: 2 g via INTRAVENOUS
  Filled 2021-12-01: qty 50

## 2021-12-01 MED ORDER — POTASSIUM CHLORIDE CRYS ER 20 MEQ PO TBCR
40.0000 meq | EXTENDED_RELEASE_TABLET | Freq: Once | ORAL | Status: AC
  Administered 2021-12-01: 40 meq via ORAL
  Filled 2021-12-01: qty 2

## 2021-12-01 MED ORDER — INSULIN GLARGINE-YFGN 100 UNIT/ML ~~LOC~~ SOLN
22.0000 [IU] | Freq: Every day | SUBCUTANEOUS | Status: DC
  Administered 2021-12-01 – 2021-12-02 (×2): 22 [IU] via SUBCUTANEOUS
  Filled 2021-12-01 (×2): qty 0.22

## 2021-12-01 MED ORDER — IPRATROPIUM-ALBUTEROL 0.5-2.5 (3) MG/3ML IN SOLN
3.0000 mL | Freq: Three times a day (TID) | RESPIRATORY_TRACT | Status: DC
  Administered 2021-12-01 – 2021-12-02 (×3): 3 mL via RESPIRATORY_TRACT
  Filled 2021-12-01 (×4): qty 3

## 2021-12-01 MED ORDER — GUAIFENESIN-DM 100-10 MG/5ML PO SYRP
5.0000 mL | ORAL_SOLUTION | ORAL | Status: DC | PRN
  Administered 2021-12-01 (×2): 5 mL via ORAL
  Filled 2021-12-01 (×2): qty 5

## 2021-12-01 MED ORDER — CEFDINIR 300 MG PO CAPS
300.0000 mg | ORAL_CAPSULE | Freq: Two times a day (BID) | ORAL | Status: DC
  Administered 2021-12-01 – 2021-12-02 (×2): 300 mg via ORAL
  Filled 2021-12-01 (×4): qty 1

## 2021-12-01 NOTE — Progress Notes (Signed)
PROGRESS NOTE    Misty Cervantes  OEH:[id number] DOB: Dec 16, 1945 DOA: 11/27/2021 PCP: Jeanie Sewer, NP    Chief Complaint  Patient presents with   Shortness of Breath    Brief Narrative:  HPI per Dr. Ileene Rubens Misty Cervantes is a 76 y.o. female with medical history significant of hypertension, hyperlipidemia, diabetes mellitus type 2, CKD stage III, osteoarthritis, history of gastric ulcers who presents with complaints of cough and shortness of breath.  Patient had last been hospitalized in 6/10-6/12 for community-acquired pneumonia ultimately discharged home to complete course of cefdinir and azithromycin.  Patient reported that after that hospitalization she felt like she initially got better, but intermittently had noted a cough.  However, over the last 2 weeks she reports the cough which was mostly nonproductive had worsened to the point where she was unable to sleep at night.  She had been taking cough medicine without improvement in symptoms.  Reported feeling short of breath even minimal exertion, fevers elevated up to 101.2 F, generalized weakness, decreased appetite, and pain in all over from coughing.  Denied having any vomiting, or episodes of diarrhea.  Patient has no significant history of tobacco abuse. Her grandkids whom stay with her ages 44 and 102 had urged her to come to the doctor for the last 7-8 days as she had been stuck in the bed not getting up.  They note over the last 2 days she had been slightly confused.   On admission into the emergency department patient was seen to be afebrile with tachycardia, tachypnea, and O2 saturations noted to be as low as 89% with improvement on 3 L of nasal cannula oxygen with O2 saturation greater than 92%.  Labs yesterday significant for WBC 13, sodium 128, BUN 20, creatinine 1.04, glucose 444, anion gap 13 and lactic acid 1.7->1.5.  Chest x-ray noted mild heterogeneous airspace opacity at the bases possibly reflecting atelectasis,  pneumonia, or aspiration.  Influenza and COVID-19 screening were negative.  Patient had been given Rocephin and azithromycin  Assessment & Plan:   Principal Problem:   HCAP (healthcare-associated pneumonia) Active Problems:   Sepsis ([place])   Respiratory distress   Type 2 diabetes mellitus with hyperglycemia, without long-term current use of insulin ([place])   Hypertension associated with diabetes ([place])   Chronic kidney disease   Hyperlipidemia associated with type 2 diabetes mellitus (H[facility])   Pneumonia of both lower lobes due to infectious organism   Hypokalemia  #1 acute respiratory distress/sepsis secondary to HCAP, POA -Patient presented with fever, nonproductive cough, generalized body aches, chest x-ray concerning for heterogeneous airspace opacities in the bases of the lungs. -On admission patient noted to be tachycardic, tachypneic, have a leukocytosis meeting criteria for SIRS.  Lactic acid reassuring. -Patient noted to have O2 sats dropped as low as 89% on room air requiring 3 L nasal cannula to make sats greater than 92%. -Patient pancultured and started empirically on Rocephin and azithromycin. -D-dimer obtained elevated. -CT angiogram chest obtained, 11/29/2021, negative for PE, no evidence of thoracic aortic dissection, scattered coronary artery calcification, scattered groundglass and alveolar infiltrates seen in both lungs more so on the right lower lobe suggestive of multifocal pneumonia. -Urine Legionella antigen pending.  Urine pneumococcus antigen pending. -1/4 blood cultures with bacillus species. -MRSA PCR negative. -Sputum Gram stain and culture pending. -SARS coronavirus 2 PCR negative. -Influenza A and B PCR negative. -SLP ff and patient underwent MBS. -Status post completion of azithromycin.   -Due to worsening shortness of breath  overnight BNP obtained which was within normal limits, chest x-ray done with persistent ill-defined opacities within the right greater  than left lung bases likely reflecting multifocal pneumonia given findings on recent prior chest CT.  -Transition from IV Rocephin to oral Omnicef for 4 more days to complete a 7-day course of treatment.   -Continue Mucinex.   -Supportive care.  2.  Uncontrolled diabetes mellitus type 2, without long-term use of insulin -CBG noted on admission to be elevated to 444 initially on presentation. -Last hemoglobin A1c 9.2 (08/2021) -Repeat hemoglobin A1c. -Continue to hold oral hypoglycemic agents. -CBG noted at 253 this morning. -Increase Semglee to 22 units daily.  SSI.  -Diabetes coordinator following.  3.  Hypertension -Losartan.  4.  CKD stage IIIa -Renal function improving.  Stable.  5.  Pseudohyponatremia -On admission sodium at 128, correction with hyperglycemia noted to be 136. -Sodium at 133.  6.  Hypokalemia -Potassium at 3.4.   -K-Dur 40 mEq p.o. x1.   -Follow.  7.  Hyperlipidemia -Continue statin.  8.  Obesity -BMI 30.28 kg/m2. -Per admitting physician patient appears to have been on phentermine last prescribed 8/8 when checked in PMDP. -Lifestyle modification -Outpatient follow-up with PCP.   DVT prophylaxis: Lovenox Code Status: Full Family Communication: Updated patient.  No family at bedside. Disposition: TBD  Status is: Inpatient Remains inpatient appropriate because: Severity of illness   Consultants:  None  Procedures:  CT angiogram chest 11/29/2021 Chest x-ray 11/27/2021, 12/01/2021   Antimicrobials:  Oral azithromycin 11/28/2021>>>> 12/02/2021 IV azithromycin 11/27/2021 x 1 IV Rocephin 11/27/2021>>>> 12/01/2021 Omnicef 12/01/2021>>>   Subjective: Sitting up in bed about to eat a sandwich.  Denies any chest pain.  Feels shortness of breath is improving.  Cough slowly improving.  No nausea or emesis.  Overall feeling better.  Asking when she is going to be able to go home.  Patient noted overnight to have some wheezing, shortness of breath like she  was gasping for air and given as needed nebulizer treatment.  Objective: Vitals:   12/01/21 0048 12/01/21 0340 12/01/21 0838 12/01/21 1138  BP: (!) 158/81 102/86  134/85  Pulse: 86 79  75  Resp: '20 20  17  '$ Temp: 99 F (37.2 C) 99 F (37.2 C)  97.8 F (36.6 C)  TempSrc: Oral Oral  Oral  SpO2: 93% 93%  96%  Weight:   69.7 kg   Height:        Intake/Output Summary (Last 24 hours) at 12/01/2021 1502 Last data filed at 12/01/2021 1139 Gross per 24 hour  Intake 340 ml  Output 0 ml  Net 340 ml    Filed Weights   11/27/21 2012 11/30/21 0602 12/01/21 0838  Weight: 68 kg 69.3 kg 69.7 kg    Examination:  General exam: Alert.  NAD. Respiratory system: Improving bibasilar coarse breath sounds R > L.  Speaking in full sentences.  Fair air movement.  Cardiovascular system: Regular rate rhythm no murmurs rubs or gallops.  No JVD.  No lower extremity edema.  Gastrointestinal system: Abdomen is soft, nontender, nondistended, positive bowel sounds.  No rebound.  No guarding.  Central nervous system: Alert and oriented. No focal neurological deficits. Extremities: Symmetric 5 x 5 power. Skin: No rashes, lesions or ulcers Psychiatry: Judgement and insight appear normal. Mood & affect appropriate.     Data Reviewed: I have personally reviewed following labs and imaging studies  CBC: Recent Labs  Lab 11/27/21 2000 11/29/21 0127 11/30/21 0229 12/01/21 0406  WBC  13.0* 12.6* 14.3* 10.9*  NEUTROABS 9.0*  --  11.2* 8.0*  HGB 14.1 12.8 12.6 12.0  HCT 41.2 38.2 36.9 35.6*  MCV 90.0 91.2 90.7 91.8  PLT 353 304 327 345     Basic Metabolic Panel: Recent Labs  Lab 11/27/21 2000 11/28/21 1603 11/29/21 0127 11/30/21 0229 12/01/21 0406  NA 128* 131* 132* 135 133*  K 3.8 3.5 3.3* 4.1 3.4*  CL 91* 102 96* 102 101  CO2 '24 24 22 22 23  '$ GLUCOSE 444* 318* 251* 201* 265*  BUN '20 14 15 11 10  '$ CREATININE 1.04* 0.98 0.93 0.94 0.92  CALCIUM 9.7 8.1* 8.8* 8.7* 8.6*  MG  --   --  1.9 1.7  1.8     GFR: Estimated Creatinine Clearance: 44.2 mL/min (by C-G formula based on SCr of 0.92 mg/dL).  Liver Function Tests: Recent Labs  Lab 11/27/21 2000  AST 26  ALT 23  ALKPHOS 119  BILITOT 0.8  PROT 7.9  ALBUMIN 4.2     CBG: Recent Labs  Lab 11/30/21 1157 11/30/21 1609 11/30/21 2047 12/01/21 0604 12/01/21 1136  GLUCAP 318* 261* 250* 253* 239*      Recent Results (from the past 240 hour(s))  Resp Panel by RT-PCR (Flu A&B, Covid) Anterior Nasal Swab     Status: None   Collection Time: 11/27/21  8:00 PM   Specimen: Anterior Nasal Swab  Result Value Ref Range Status   SARS Coronavirus 2 by RT PCR NEGATIVE NEGATIVE Final    Comment: (NOTE) SARS-CoV-2 target nucleic acids are NOT DETECTED.  The SARS-CoV-2 RNA is generally detectable in upper respiratory specimens during the acute phase of infection. The lowest concentration of SARS-CoV-2 viral copies this assay can detect is 138 copies/mL. A negative result does not preclude SARS-Cov-2 infection and should not be used as the sole basis for treatment or other patient management decisions. A negative result may occur with  improper specimen collection/handling, submission of specimen other than nasopharyngeal swab, presence of viral mutation(s) within the areas targeted by this assay, and inadequate number of viral copies(<138 copies/mL). A negative result must be combined with clinical observations, patient history, and epidemiological information. The expected result is Negative.  Fact Sheet for Patients:  EntrepreneurPulse.com.au  Fact Sheet for Healthcare Providers:  IncredibleEmployment.be  This test is no t yet approved or cleared by the [place] FDA and  has been authorized for detection and/or diagnosis of SARS-CoV-2 by FDA under an Emergency Use Authorization (EUA). This EUA will remain  in effect (meaning this test can be used) for the duration of  the COVID-19 declaration under Section 564(b)(1) of the Act, 21 U.S.C.section 360bbb-3(b)(1), unless the authorization is terminated  or revoked sooner.       Influenza A by PCR NEGATIVE NEGATIVE Final   Influenza B by PCR NEGATIVE NEGATIVE Final    Comment: (NOTE) The Xpert Xpress SARS-CoV-2/FLU/RSV plus assay is intended as an aid in the diagnosis of influenza from Nasopharyngeal swab specimens and should not be used as a sole basis for treatment. Nasal washings and aspirates are unacceptable for Xpert Xpress SARS-CoV-2/FLU/RSV testing.  Fact Sheet for Patients: EntrepreneurPulse.com.au  Fact Sheet for Healthcare Providers: IncredibleEmployment.be  This test is not yet approved or cleared by the [place] FDA and has been authorized for detection and/or diagnosis of SARS-CoV-2 by FDA under an Emergency Use Authorization (EUA). This EUA will remain in effect (meaning this test can be used) for the duration of the COVID-19 declaration  under Section 564(b)(1) of the Act, 21 U.S.C. section 360bbb-3(b)(1), unless the authorization is terminated or revoked.  Performed at [facility], [address] [place], [place] [address]   Blood culture (routine x 2)     Status: None (Preliminary result)   Collection Time: 11/27/21 10:15 PM   Specimen: BLOOD  Result Value Ref Range Status   Specimen Description   Final    BLOOD RIGHT ANTECUBITAL Performed at [facility], [address], [place], [place] [address]    Special Requests   Final    BOTTLES DRAWN AEROBIC AND ANAEROBIC Blood Culture adequate volume Performed at [facility], [address] [place], [place] [address]    Culture   Final    NO GROWTH 3 DAYS Performed at [facility], [place] [address], [place], [place] [address]    Report Status PENDING  Incomplete  Blood culture (routine x 2)     Status: Abnormal    Collection Time: 11/27/21 10:57 PM   Specimen: BLOOD  Result Value Ref Range Status   Specimen Description   Final    BLOOD LEFT ARM Performed at [facility], [address], [place], [place] [address]    Special Requests   Final    BOTTLES DRAWN AEROBIC AND ANAEROBIC Blood Culture adequate volume Performed at [facility], [address], [place], [place] [address]    Culture  Setup Time (A)  Final    GRAM VARIABLE ROD AEROBIC BOTTLE ONLY CRITICAL RESULT CALLED TO, READ BACK BY AND VERIFIED WITH: T RUDISILL,PHARMD'@0614'$  11/29/21 [place]    Culture (A)  Final    BACILLUS SPECIES Standardized susceptibility testing for this organism is not available. Performed at [facility], [place] [address] [place], [place] [address]    Report Status 12/01/2021 FINAL  Final  MRSA Next Gen by PCR, Nasal     Status: None   Collection Time: 11/28/21  4:15 PM  Result Value Ref Range Status   MRSA by PCR Next Gen NOT DETECTED NOT DETECTED Final    Comment: (NOTE) The GeneXpert MRSA Assay (FDA approved for NASAL specimens only), is one component of a comprehensive MRSA colonization surveillance program. It is not intended to diagnose MRSA infection nor to guide or monitor treatment for MRSA infections. Test performance is not FDA approved in patients less than 9 years old. Performed at [facility], [place] [address], [place], [place] [address]          Radiology Studies: DG Chest 2 View  Result Date: 12/01/2021 CLINICAL DATA:  Provided history: Shortness of breath. EXAM: CHEST - 2 VIEW COMPARISON:  Chest CT 11/29/2021. Prior chest radiographs 11/27/2021. FINDINGS: Stable cardiomediastinal silhouette. Aortic atherosclerosis. Persistent ill-defined opacities within the right greater than left lung bases, likely reflecting multifocal pneumonia given the findings on the recent prior chest CT of 11/29/2021. No evidence of pleural effusion or  pneumothorax. Degenerative changes of the spine. IMPRESSION: Persistent ill-defined opacities within the right greater than left lung bases, likely reflecting multifocal pneumonia given the findings on the recent prior chest CT of 11/29/2021. Aortic Atherosclerosis (ICD10-I70.0). Electronically Signed   By: Kellie Simmering D.O.   On: 12/01/2021 10:48   DG Swallowing Func-Speech Pathology  Result Date: 11/30/2021 Table formatting from the original result was not included. Images from the original result were not included. Objective Swallowing Evaluation: Type of Study: MBS-Modified Barium Swallow Study  Patient Details Name: Misty Cervantes MRN: [id number]  Date of Birth: 12-Mar-1946 Today's Date: 11/30/2021 Time: SLP Start Time (ACUTE ONLY): 0930 -SLP Stop Time (ACUTE ONLY): 1000 SLP Time Calculation (min) (ACUTE ONLY): 30 min Past Medical History: Past Medical History: Diagnosis Date  Chronic kidney disease (CKD), stage III (moderate) ([place])   followed by pcp  Full dentures   History of 2019 novel coronavirus disease (COVID-19) 04/05/2019  result in care everywhere ,  per pt mild symptoms that resolved  History of gastric ulcer 2013  non-bleeding  Hyperlipemia   Mixed stress and urge incontinence   Osteoarthritis   Presence of pessary   Type 2 diabetes mellitus ([place])   followed by pcp   (08-26-2020 per pt checks blood sugar twice weekly, unsure what fasting sugar is)  Vaginal vault 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   gyn--- dr Talbert Nan, using pessary  Wears glasses  Past Surgical History: Past Surgical History: Procedure Laterality Date  CARPAL TUNNEL RELEASE Right   1980s  CATARACT EXTRACTION W/ INTRAOCULAR LENS IMPLANT Bilateral 2013  approx  COLONOSCOPY WITH ESOPHAGOGASTRODUODENOSCOPY (EGD)  11/17/2011  COLPOCLEISIS N/A 09/02/2020  Procedure: COLPOCLEISIS, PERINEORRHAPHY;  Surgeon: Salvadore Dom, MD;  Location: [facility];  Service: Gynecology;  Laterality: N/A;  CYSTOSCOPY N/A 09/02/2020  Procedure:  CYSTOSCOPY;  Surgeon: Salvadore Dom, MD;  Location[facility];  Service: Gynecology;  Laterality: N/A;  RETINAL DETACHMENT SURGERY Right 2003  TOTAL HIP ARTHROPLASTY Left 03/02/2010  '@[facility]'$   TOTAL KNEE ARTHROPLASTY Right 11/03/2015  Procedure: RIGHT TOTAL KNEE ARTHROPLASTY;  Surgeon: Gaynelle Arabian, MD;  Location: [facility];  Service: Orthopedics;  Laterality: Right;  TOTAL VAGINAL HYSTERECTOMY  2012  TVH, anterior repair, TVT (no mesh used for the anterior repair) HPI: Misty Cervantes is a 76 y.o. female with medical history significant of hypertension, hyperlipidemia, diabetes mellitus type 2, CKD stage III, osteoarthritis, history of gastric ulcers who presents with complaints of cough and shortness of breath. Prior hospitalization 3 months ago for CAP; resolved with abx. No h/o PNA prior to that hospitalization.  Subjective: Pt denies dysphagia; lying down drinking from straw upon SLP arrival.  Recommendations for follow up therapy are one component of a multi-disciplinary discharge planning process, led by the attending physician.  Recommendations may be updated based on patient status, additional functional criteria and insurance authorization. Assessment / Plan / Recommendation   11/30/2021  10:00 AM Clinical Impressions Clinical Impression Pt demonstrates normal swallowing function. She is slightly drowsy during assessment, but was able to consume all texures without impairment. Pt to continue regular diet and thin liquids. Will sign off. Impact on safety and function Mild aspiration risk     11/29/2021  11:48 AM Treatment Recommendations Treatment Recommendations F/U MBS in ___ days (Comment)     11/29/2021  11:48 AM Prognosis Prognosis for Safe Diet Advancement Good   11/30/2021  10:00 AM Diet Recommendations SLP Diet Recommendations Regular solids;Thin liquid Liquid Administration via Cup;Straw Medication Administration Whole meds with liquid     11/30/2021  10:00 AM Other Recommendations  Follow Up Recommendations No SLP follow up    No data to display        11/30/2021  10:00 AM Oral Phase Oral Phase [facility]    11/30/2021  10:00 AM Pharyngeal Phase Pharyngeal Phase [facility]    11/30/2021  10:00 AM Cervical Esophageal Phase  Cervical Esophageal Phase [facility], Katherene Ponto 11/30/2021, 10:33 AM  Scheduled Meds:  aspirin EC  81 mg Oral Daily   enoxaparin (LOVENOX) injection  40 mg Subcutaneous Q24H   fluticasone  2 spray Each Nare Daily   guaiFENesin  1,200 mg Oral BID   insulin aspart  0-15 Units Subcutaneous TID WC   insulin aspart  0-5 Units Subcutaneous QHS   insulin glargine-yfgn  22 Units Subcutaneous Daily   ipratropium-albuterol  3 mL Nebulization TID   losartan  50 mg Oral Daily   pantoprazole  40 mg Oral Q0600   pravastatin  40 mg Oral QHS   sodium chloride flush  10-40 mL Intracatheter Q12H   sodium chloride flush  3 mL Intravenous Q12H   Continuous Infusions:  cefTRIAXone (ROCEPHIN)  IV Stopped (11/30/21 2109)     LOS: 3 days    Time spent: 35 minutes    Irine Seal, MD [facility]   To contact the attending provider between 7A-7P or the covering provider during after hours 7P-7A, please log into the web site [email or web address] and access using universal [place] password for that web site. If you do not have the password, please call the hospital operator.  12/01/2021, 3:02 PM

## 2021-12-01 NOTE — Evaluation (Signed)
Occupational Therapy Evaluation Patient Details Name: Misty Cervantes MRN: [id number] DOB: Mar 01, 1946 Today's Date: 12/01/2021   History of Present Illness Pt is a 76 y/o F presenting for SOB and cough, recent hospitalization 6/10-6/12 for CAP. PMH Includes HTN, HLD, DM2, CKD III, OA, and gastric ulcers.   Clinical Impression   Pt is independent at baseline with ADLs and functional mobility, lives with grandsons (age 37 and 60). Pt currently min guard-min A for ADLs and min guard for transfers without AD. Pt with decreased activity tolerance having 1/4 DOE with standing ADLs and toilet transfer. Pt presenting with impairments listed below, will follow acutely. Recommend d/c home with family assistance.     Recommendations for follow up therapy are one component of a multi-disciplinary discharge planning process, led by the attending physician.  Recommendations may be updated based on patient status, additional functional criteria and insurance authorization.   Follow Up Recommendations  No OT follow up    Assistance Recommended at Discharge Set up Supervision/Assistance  Patient can return home with the following A little help with walking and/or transfers;A little help with bathing/dressing/bathroom;Assistance with cooking/housework;Assist for transportation;Help with stairs or ramp for entrance;Direct supervision/assist for medications management;Direct supervision/assist for financial management    Functional Status Assessment  Patient has had a recent decline in their functional status and demonstrates the ability to make significant improvements in function in a reasonable and predictable amount of time.  Equipment Recommendations  Tub/shower seat    Recommendations for Other Services       Precautions / Restrictions Precautions Precautions: Fall Restrictions Weight Bearing Restrictions: No      Mobility Bed Mobility               General bed mobility comments:  seated EOB upon arrival    Transfers Overall transfer level: Needs assistance Equipment used: None Transfers: Sit to/from Stand Sit to Stand: Min guard           General transfer comment: mild unsteadiness, reaching out for external support with short distances      Balance Overall balance assessment: Needs assistance Sitting-balance support: Feet supported Sitting balance-Leahy Scale: Good     Standing balance support: During functional activity, Reliant on assistive device for balance Standing balance-Leahy Scale: Fair Standing balance comment: mild unsteadiness                           ADL either performed or assessed with clinical judgement   ADL Overall ADL's : Needs assistance/impaired Eating/Feeding: Modified independent   Grooming: Min guard;Oral care;Wash/dry face Grooming Details (indicate cue type and reason): standing at sink Upper Body Bathing: Min guard   Lower Body Bathing: Minimal assistance   Upper Body Dressing : Min guard Upper Body Dressing Details (indicate cue type and reason): donning gown Lower Body Dressing: Minimal assistance Lower Body Dressing Details (indicate cue type and reason): donning brief Toilet Transfer: Supervision/safety;Ambulation;Regular Museum/gallery exhibitions officer and Hygiene: Min guard Toileting - Clothing Manipulation Details (indicate cue type and reason): pericare     Functional mobility during ADLs: Min guard;Rolling walker (2 wheels)       Vision   Vision Assessment?: No apparent visual deficits     Perception     Praxis      Pertinent Vitals/Pain Pain Assessment Pain Assessment: Faces Pain Score: 3  Faces Pain Scale: Hurts a little bit Pain Location: headache Pain Descriptors / Indicators: Discomfort  Hand Dominance Right   Extremity/Trunk Assessment Upper Extremity Assessment Upper Extremity Assessment: Generalized weakness   Lower Extremity Assessment Lower  Extremity Assessment: Generalized weakness   Cervical / Trunk Assessment Cervical / Trunk Assessment: Normal   Communication Communication Communication: No difficulties   Cognition Arousal/Alertness: Awake/alert Behavior During Therapy: WFL for tasks assessed/performed Overall Cognitive Status: Within Functional Limits for tasks assessed                                       General Comments  VSS on RA    Exercises     Shoulder Instructions      Home Living Family/patient expects to be discharged to:: Private residence Living Arrangements: Children (grandchildren; aged 35 and 30) Available Help at Discharge: Family;Available PRN/intermittently Type of Home: Apartment Home Access: Level entry     Home Layout: One level     Bathroom Shower/Tub: Teacher, early years/pre: Standard     Home Equipment: Conservation officer, nature (2 wheels);BSC/3in1;Cane - single point          Prior Functioning/Environment Prior Level of Function : Independent/Modified Independent;Driving             Mobility Comments: Independent, drives. No falls reported. Does not do IADls. ADLs Comments: Independent        OT Problem List: Decreased strength;Decreased activity tolerance;Impaired balance (sitting and/or standing)      OT Treatment/Interventions: Self-care/ADL training;Therapeutic exercise;Energy conservation;DME and/or AE instruction;Therapeutic activities;Visual/perceptual remediation/compensation;Patient/family education;Balance training    OT Goals(Current goals can be found in the care plan section) Acute Rehab OT Goals Patient Stated Goal: none stated OT Goal Formulation: With patient Time For Goal Achievement: 12/15/21 Potential to Achieve Goals: Good ADL Goals Pt Will Perform Grooming: Independently;standing Pt Will Perform Upper Body Dressing: Independently;sitting;standing Pt Will Perform Lower Body Dressing: Independently;sitting/lateral  leans;sit to/from stand Pt Will Transfer to Toilet: Independently;ambulating;regular height toilet Pt Will Perform Tub/Shower Transfer: Shower transfer;Tub transfer;ambulating;shower seat Additional ADL Goal #3: pt will be able to stand for 10 mins for functional task in order to improve activity tolerance for ADLs Additional ADL Goal #4: pt will be able to verbalize 4 energy conservation strategies in order to improve ADL participation  OT Frequency: Min 2X/week    Co-evaluation              AM-PAC OT "6 Clicks" Daily Activity     Outcome Measure Help from another person eating meals?: None Help from another person taking care of personal grooming?: None Help from another person toileting, which includes using toliet, bedpan, or urinal?: A Little Help from another person bathing (including washing, rinsing, drying)?: A Little Help from another person to put on and taking off regular upper body clothing?: A Little Help from another person to put on and taking off regular lower body clothing?: A Little 6 Click Score: 20   End of Session Nurse Communication: Mobility status  Activity Tolerance: Patient tolerated treatment well Patient left: in chair;with call bell/phone within reach  OT Visit Diagnosis: Unsteadiness on feet (R26.81);Other abnormalities of gait and mobility (R26.89);Muscle weakness (generalized) (M62.81)                Time: 2440-1027 OT Time Calculation (min): 34 min Charges:  OT General Charges $OT Visit: 1 Visit OT Evaluation $OT Eval Low Complexity: 1 Low OT Treatments $Self Care/Home Management : 8-22 mins  Lynnda Child, OTD, OTR/L  Acute Rehab [phone] - [place] 12/01/2021, 11:33 AM

## 2021-12-01 NOTE — Plan of Care (Signed)
  Problem: Activity: Goal: Ability to tolerate increased activity will improve Outcome: Progressing   Problem: Clinical Measurements: Goal: Ability to maintain a body temperature in the normal range will improve Outcome: Progressing   Problem: Respiratory: Goal: Ability to maintain adequate ventilation will improve Outcome: Progressing Goal: Ability to maintain a clear airway will improve Outcome: Progressing   Problem: Education: Goal: Ability to describe self-care measures that may prevent or decrease complications (Diabetes Survival Skills Education) will improve Outcome: Progressing Goal: Individualized Educational Video(s) Outcome: Progressing   Problem: Coping: Goal: Ability to adjust to condition or change in health will improve Outcome: Progressing   Problem: Fluid Volume: Goal: Ability to maintain a balanced intake and output will improve Outcome: Progressing   Problem: Health Behavior/Discharge Planning: Goal: Ability to identify and utilize available resources and services will improve Outcome: Progressing Goal: Ability to manage health-related needs will improve Outcome: Progressing   Problem: Metabolic: Goal: Ability to maintain appropriate glucose levels will improve Outcome: Progressing   Problem: Nutritional: Goal: Maintenance of adequate nutrition will improve Outcome: Progressing Goal: Progress toward achieving an optimal weight will improve Outcome: Progressing   Problem: Skin Integrity: Goal: Risk for impaired skin integrity will decrease Outcome: Progressing   Problem: Tissue Perfusion: Goal: Adequacy of tissue perfusion will improve Outcome: Progressing

## 2021-12-01 NOTE — Progress Notes (Signed)
Mobility Specialist Progress Note:   12/01/21 1545  Mobility  Activity Ambulated with assistance in hallway  Level of Assistance Contact guard assist, steadying assist  Assistive Device None  Distance Ambulated (ft) 600 ft  Activity Response Tolerated well  $Mobility charge 1 Mobility   Pt received in bed willing to participate in mobility. Complaints of R knee pain. Left EOB with call bell in reach and all needs met.    [facility] Surveyor, mining Chat only

## 2021-12-02 ENCOUNTER — Other Ambulatory Visit (HOSPITAL_COMMUNITY): Payer: Self-pay

## 2021-12-02 DIAGNOSIS — J189 Pneumonia, unspecified organism: Secondary | ICD-10-CM | POA: Diagnosis not present

## 2021-12-02 LAB — BASIC METABOLIC PANEL
Anion gap: 7 (ref 5–15)
BUN: 8 mg/dL (ref 8–23)
CO2: 25 mmol/L (ref 22–32)
Calcium: 8.6 mg/dL — ABNORMAL LOW (ref 8.9–10.3)
Chloride: 102 mmol/L (ref 98–111)
Creatinine, Ser: 1 mg/dL (ref 0.44–1.00)
GFR, Estimated: 58 mL/min — ABNORMAL LOW (ref >60)
Glucose, Bld: 276 mg/dL — ABNORMAL HIGH (ref 70–99)
Potassium: 4.2 mmol/L (ref 3.5–5.1)
Sodium: 134 mmol/L — ABNORMAL LOW (ref 135–145)

## 2021-12-02 LAB — CBC WITH DIFFERENTIAL/PLATELET
Abs Immature Granulocytes: 0.25 10*3/uL — ABNORMAL HIGH (ref 0.00–0.07)
Basophils Absolute: 0 10*3/uL (ref 0.0–0.1)
Basophils Relative: 0 %
Eosinophils Absolute: 0.1 10*3/uL (ref 0.0–0.5)
Eosinophils Relative: 2 %
HCT: 34.7 % — ABNORMAL LOW (ref 36.0–46.0)
Hemoglobin: 11.7 g/dL — ABNORMAL LOW (ref 12.0–15.0)
Immature Granulocytes: 3 %
Lymphocytes Relative: 16 %
Lymphs Abs: 1.4 10*3/uL (ref 0.7–4.0)
MCH: 30.9 pg (ref 26.0–34.0)
MCHC: 33.7 g/dL (ref 30.0–36.0)
MCV: 91.6 fL (ref 80.0–100.0)
Monocytes Absolute: 0.7 10*3/uL (ref 0.1–1.0)
Monocytes Relative: 8 %
Neutro Abs: 6.6 10*3/uL (ref 1.7–7.7)
Neutrophils Relative %: 71 %
Platelets: 343 10*3/uL (ref 150–400)
RBC: 3.79 MIL/uL — ABNORMAL LOW (ref 3.87–5.11)
RDW: 12.4 % (ref 11.5–15.5)
WBC: 9.2 10*3/uL (ref 4.0–10.5)
nRBC: 0 % (ref 0.0–0.2)

## 2021-12-02 LAB — GLUCOSE, CAPILLARY
Glucose-Capillary: 241 mg/dL — ABNORMAL HIGH (ref 70–99)
Glucose-Capillary: 263 mg/dL — ABNORMAL HIGH (ref 70–99)
Glucose-Capillary: 240 mg/dL — ABNORMAL HIGH (ref 70–99)

## 2021-12-02 LAB — MAGNESIUM: Magnesium: 2.2 mg/dL (ref 1.7–2.4)

## 2021-12-02 MED ORDER — CEFDINIR 300 MG PO CAPS
300.0000 mg | ORAL_CAPSULE | Freq: Two times a day (BID) | ORAL | 0 refills | Status: AC
  Filled 2021-12-02: qty 8, 4d supply, fill #0

## 2021-12-02 MED ORDER — SITAGLIPTIN PHOSPHATE 50 MG PO TABS
50.0000 mg | ORAL_TABLET | Freq: Every day | ORAL | 0 refills | Status: DC
  Filled 2021-12-02: qty 30, 30d supply, fill #0

## 2021-12-02 MED ORDER — GLIPIZIDE ER 5 MG PO TB24
5.0000 mg | ORAL_TABLET | Freq: Two times a day (BID) | ORAL | 0 refills | Status: DC
  Filled 2021-12-02: qty 60, 30d supply, fill #0

## 2021-12-02 MED ORDER — METFORMIN HCL 500 MG PO TABS
500.0000 mg | ORAL_TABLET | Freq: Every day | ORAL | 0 refills | Status: DC
  Filled 2021-12-02: qty 30, 30d supply, fill #0

## 2021-12-02 NOTE — Progress Notes (Signed)
Pt d/c from 4E to home with grand-daughter. D/c information and med education provided. IV and tele removed. Pt states understanding.  Raelyn Number, RN

## 2021-12-02 NOTE — Discharge Summary (Signed)
Physician Discharge Summary  Misty Cervantes:[id number] DOB: March 04, 1946 DOA: 11/27/2021  PCP: Jeanie Sewer, NP  Admit date: 11/27/2021 Discharge date: 12/02/2021  Time spent: 40 minutes  Recommendations for Outpatient Follow-up:  Follow outpatient CBC/CMP Follow with pulm outpatient for recurrent pneumonia, ensure resolution Follow blood sugars outpatient, suspect she'll need insulin - started metformin, januvia, increased glipizide   Discharge Diagnoses:  Principal Problem:   HCAP (healthcare-associated pneumonia) Active Problems:   Sepsis ([place])   Respiratory distress   Type 2 diabetes mellitus with hyperglycemia, without long-term current use of insulin ([place])   Hypertension associated with diabetes ([place])   Chronic kidney disease   Hyperlipidemia associated with type 2 diabetes mellitus ([place])   Pneumonia of both lower lobes due to infectious organism   Hypokalemia   Discharge Condition: stable  Diet recommendation: heart healthy, diabetic   Filed Weights   11/27/21 2012 11/30/21 0602 12/01/21 0838  Weight: 68 kg 69.3 kg 69.7 kg    History of present illness:  HPI per Dr. Ileene Rubens Misty Cervantes is [name] 76 y.o. female with medical history significant of hypertension, hyperlipidemia, diabetes mellitus type 2, CKD stage III, osteoarthritis, history of gastric ulcers who presents with complaints of cough and shortness of breath.  Patient had last been hospitalized in 6/10-6/12 for community-acquired pneumonia ultimately discharged home to complete course of cefdinir and azithromycin.  Patient reported that after that hospitalization she felt like she initially got better, but intermittently had noted Misty Cervantes cough.  She presented with worsening cough, SOB and fever.  Imaging showed findings consistent with pneumonia.  She's gradually improved with antibiotics.  See below for additional details     Hospital Course:  Assessment and Plan: #1 acute respiratory distress/sepsis  secondary to HCAP, POA -CT PE with scattered ground glass and alveolar infiltrates in both lungs -CXR with persistent ill defined opacities within the right greater than L lung bases -IV abx completed, transitioned to oral abx -D-dimer obtained elevated. -urine strep and legionella collected, still pending for some reason -1/2 blood cultures with bacillus species in aerobic bottle only, suspect this is contaminant  -MRSA PCR negative. -SARS coronavirus 2 PCR negative. -Influenza Roneka Gilpin and B PCR negative. -SLP eval, regular diet, thin liquids -Status post completion of azithromycin.     2.  Uncontrolled diabetes mellitus type 2, without long-term use of insulin -Last hemoglobin A1c 9.3 -I recommended insulin, but she prefers to try to avoid this  -will increase glipizide to 5 mg twice daily, start januvia, start metformin (she notes being on this in the past and not tolerating, can't clearly tell me reason, will start low dose) -needs outpatient follow up with PCP    3.  Hypertension -Losartan.   4.  CKD stage IIIa -Renal function improving.  Stable.   5.  Pseudohyponatremia -follow outpatient, related to hyperglycemia   6.  Hypokalemia Improved  7.  Hyperlipidemia -Continue statin.   8.  Obesity -BMI 30.28 kg/m2. -Per admitting physician patient appears to have been on phentermine last prescribed 8/8 when checked in PMDP. -Lifestyle modification -Outpatient follow-up with PCP.    Procedures: none   Consultations: none  Discharge Exam: Vitals:   12/02/21 0900 12/02/21 1300  BP: (!) 141/79 137/85  Pulse: 86 85  Resp: 20 20  Temp: 98.2 F (36.8 C) 98.1 F (36.7 C)  SpO2: 100% 95%   Eager to go home today  General: No acute distress. Cardiovascular: RRR Lungs: good airmovement, coarse breath sounds Abdomen: Soft, nontender, nondistended  Neurological: Alert and oriented 3. Moves all extremities 4 with equal strength. Cranial nerves II through XII grossly  intact. Extremities: No clubbing or cyanosis. No edema  Discharge Instructions   Discharge Instructions     Call MD for:  difficulty breathing, headache or visual disturbances   Complete by: As directed    Call MD for:  extreme fatigue   Complete by: As directed    Call MD for:  hives   Complete by: As directed    Call MD for:  persistant dizziness or light-headedness   Complete by: As directed    Call MD for:  persistant nausea and vomiting   Complete by: As directed    Call MD for:  redness, tenderness, or signs of infection (pain, swelling, redness, odor or green/yellow discharge around incision site)   Complete by: As directed    Call MD for:  severe uncontrolled pain   Complete by: As directed    Call MD for:  temperature >100.4   Complete by: As directed    Diet - low sodium heart healthy   Complete by: As directed    Discharge instructions   Complete by: As directed    You were seen for pneumonia.  You've gradually improved with antibiotics.  We'll send you with another 4 days of antibiotics.  You should have repeat chest imaging as an outpatient with your PCP to ensure your findings improve.  Your A1c is around 9.  I think you need more than glipizide alone to improve your blood sugars.  I would recommend starting insulin, but given your preference, we'll increase your glipizide to 5 mg twice daily and add [place].  You'll need to continue to watch your blood sugars outpatient, follow with your PCP outpatient for additional blood sugar management.   Return for new, recurrent, or worsening symptoms.  Please ask your PCP to request records from this hospitalization so they know what was done and what the next steps will be.   Increase activity slowly   Complete by: As directed       Allergies as of 12/02/2021       Reactions   Latex Other (See Comments)   Exact reaction not cited   Norvasc [amlodipine] Swelling        Medication List     TAKE these  medications    Accu-Chek FastClix Lancets Misc Check blood sugars 1-2 times per day.   Accu-Chek Guide test strip Generic drug: glucose blood TEST FOUR TIMES DAILY   Accu-Chek Guide w/Device Kit Use as needed   acetaminophen 500 MG tablet Commonly known as: TYLENOL Take 500-1,000 mg by mouth 2 (two) times daily as needed for headache or mild pain.   Alavert 10 MG dissolvable tablet Generic drug: loratadine Take 10 mg by mouth daily as needed for allergies.   aspirin EC 81 MG tablet Take 81 mg by mouth daily. Swallow whole.   B-12 PO Take 1 tablet by mouth daily.   cefdinir 300 MG capsule Commonly known as: OMNICEF Take 1 capsule (300 mg total) by mouth every 12 (twelve) hours for 4 days.   fluticasone 50 MCG/ACT nasal spray Commonly known as: FLONASE SPRAY 1 SPRAY INTO EACH NOSTRIL TWICE [place] DAY AS NEEDED FOR ALLERGIES OR RHINITIS What changed: See the new instructions.   glipiZIDE 5 MG 24 hr tablet Commonly known as: GLUCOTROL XL Take 1 tablet (5 mg total) by mouth 2 (two) times daily before Demetrio Leighty meal. What changed: when to  take this   losartan 50 MG tablet Commonly known as: COZAAR Take 1 tablet (50 mg total) by mouth daily.   meclizine 25 MG tablet Commonly known as: ANTIVERT TAKE 1 TABLET BY MOUTH 3 TIMES Kenesha Moshier DAY AS NEEDED FOR DIZZINESS   multivitamin with minerals Tabs tablet Take 1 tablet by mouth daily with breakfast.   pravastatin 40 MG tablet Commonly known as: PRAVACHOL Take 1 tablet (40 mg total) by mouth at bedtime.   sitaGLIPtin 50 MG tablet Commonly known as: Januvia Take 1 tablet (50 mg total) by mouth daily.       Allergies  Allergen Reactions   Latex Other (See Comments)    Exact reaction not cited   Norvasc [Amlodipine] Swelling      The results of significant diagnostics from this hospitalization (including imaging, microbiology, ancillary and laboratory) are listed below for reference.    Significant Diagnostic Studies: DG Chest 2  View  Result Date: 12/01/2021 CLINICAL DATA:  Provided history: Shortness of breath. EXAM: CHEST - 2 VIEW COMPARISON:  Chest CT 11/29/2021. Prior chest radiographs 11/27/2021. FINDINGS: Stable cardiomediastinal silhouette. Aortic atherosclerosis. Persistent ill-defined opacities within the right greater than left lung bases, likely reflecting multifocal pneumonia given the findings on the recent prior chest CT of 11/29/2021. No evidence of pleural effusion or pneumothorax. Degenerative changes of the spine. IMPRESSION: Persistent ill-defined opacities within the right greater than left lung bases, likely reflecting multifocal pneumonia given the findings on the recent prior chest CT of 11/29/2021. Aortic Atherosclerosis (ICD10-I70.0). Electronically Signed   By: Kellie Simmering D.O.   On: 12/01/2021 10:48   DG Swallowing Func-Speech Pathology  Result Date: 11/30/2021 Table formatting from the original result was not included. Images from the original result were not included. Objective Swallowing Evaluation: Type of Study: MBS-Modified Barium Swallow Study  Patient Details Name: CHENELL LOZON MRN: [id number] Date of Birth: 17-Jun-1945 Today's Date: 11/30/2021 Time: SLP Start Time (ACUTE ONLY): 0930 -SLP Stop Time (ACUTE ONLY): 1000 SLP Time Calculation (min) (ACUTE ONLY): 30 min Past Medical History: Past Medical History: Diagnosis Date  Chronic kidney disease (CKD), stage III (moderate) ([place])   followed by pcp  Full dentures   History of 2019 novel coronavirus disease (COVID-19) 04/05/2019  result in care everywhere ,  per pt mild symptoms that resolved  History of gastric ulcer 2013  non-bleeding  Hyperlipemia   Mixed stress and urge incontinence   Osteoarthritis   Presence of pessary   Type 2 diabetes mellitus ([place])   followed by pcp   (08-26-2020 per pt checks blood sugar twice weekly, unsure what fasting sugar is)  Vaginal vault 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   gyn--- dr Talbert Nan, using pessary  Wears glasses   Past Surgical History: Past Surgical History: Procedure Laterality Date  CARPAL TUNNEL RELEASE Right   1980s  CATARACT EXTRACTION W/ INTRAOCULAR LENS IMPLANT Bilateral 2013  approx  COLONOSCOPY WITH ESOPHAGOGASTRODUODENOSCOPY (EGD)  11/17/2011  COLPOCLEISIS N/Jessaca Philippi 09/02/2020  Procedure: COLPOCLEISIS, PERINEORRHAPHY;  Surgeon: Salvadore Dom, MD;  Location: [facility];  Service: Gynecology;  Laterality: N/Shafter Jupin;  CYSTOSCOPY N/Keyna Blizard 09/02/2020  Procedure: CYSTOSCOPY;  Surgeon: Salvadore Dom, MD;  [facility]: [facility];  Service: Gynecology;  Laterality: N/Izreal Kock;  RETINAL DETACHMENT SURGERY Right 2003  TOTAL HIP ARTHROPLASTY Left 03/02/2010  $RemoveBef'@[facility]'nvuOsOpoII$   TOTAL KNEE ARTHROPLASTY Right 11/03/2015  Procedure: RIGHT TOTAL KNEE ARTHROPLASTY;  Surgeon: Gaynelle Arabian, MD;  Location: [facility];  Service: Orthopedics;  Laterality: Right;  TOTAL VAGINAL HYSTERECTOMY  2012  TVH, anterior repair, TVT (no mesh used for the anterior repair) HPI: LARRAINE ARGO is Danaiya Steadman 76 y.o. female with medical history significant of hypertension, hyperlipidemia, diabetes mellitus type 2, CKD stage III, osteoarthritis, history of gastric ulcers who presents with complaints of cough and shortness of breath. Prior hospitalization 3 months ago for CAP; resolved with abx. No h/o PNA prior to that hospitalization.  Subjective: Pt denies dysphagia; lying down drinking from straw upon SLP arrival.  Recommendations for follow up therapy are one component of Farhad Burleson multi-disciplinary discharge planning process, led by the attending physician.  Recommendations may be updated based on patient status, additional functional criteria and insurance authorization. Assessment / Plan / Recommendation   11/30/2021  10:00 AM Clinical Impressions Clinical Impression Pt demonstrates normal swallowing function. She is slightly drowsy during assessment, but was able to consume all texures without impairment. Pt to continue regular diet and thin liquids. Will sign  off. Impact on safety and function Mild aspiration risk     11/29/2021  11:48 AM Treatment Recommendations Treatment Recommendations F/U MBS in ___ days (Comment)     11/29/2021  11:48 AM Prognosis Prognosis for Safe Diet Advancement Good   11/30/2021  10:00 AM Diet Recommendations SLP Diet Recommendations Regular solids;Thin liquid Liquid Administration via Cup;Straw Medication Administration Whole meds with liquid     11/30/2021  10:00 AM Other Recommendations Follow Up Recommendations No SLP follow up    No data to display        11/30/2021  10:00 AM Oral Phase Oral Phase [facility]    11/30/2021  10:00 AM Pharyngeal Phase Pharyngeal Phase [facility]    11/30/2021  10:00 AM Cervical Esophageal Phase  Cervical Esophageal Phase [facility] [place], Katherene Ponto 11/30/2021, 10:33 AM                     CT Angio Chest Pulmonary Embolism (PE) W or WO Contrast  Result Date: 11/29/2021 CLINICAL DATA:  Persistent cough, shortness of breath, positive D-dimer EXAM: CT ANGIOGRAPHY CHEST WITH CONTRAST TECHNIQUE: Multidetector CT imaging of the chest was performed using the standard protocol during bolus administration of intravenous contrast. Multiplanar CT image reconstructions and MIPs were obtained to evaluate the vascular anatomy. RADIATION DOSE REDUCTION: This exam was performed according to the departmental dose-optimization program which includes automated exposure control, adjustment of the mA and/or kV according to patient size and/or use of iterative reconstruction technique. CONTRAST:  53mL OMNIPAQUE IOHEXOL 350 MG/ML SOLN COMPARISON:  Previous studies including chest radiographs done on 11/27/2021 and CT done on 08/23/2021 FINDINGS: Cardiovascular: There is no demonstrable intimal flap in the thoracic aorta. Coronary artery calcifications are seen. There are no filling defects in central pulmonary artery branches. Evaluation of small peripheral branches is limited by infiltrates and motion artifacts. Mediastinum/Nodes: No significant  lymphadenopathy is seen. Lungs/Pleura: Patchy ground-glass and alveolar infiltrates are seen in both lungs, more so in right lower lobe suggesting multifocal pneumonia. There is no pleural effusion or pneumothorax. Upper Abdomen: No acute findings are seen. Musculoskeletal: Unremarkable. Review of the MIP images confirms the above findings. IMPRESSION: There is no evidence of pulmonary artery embolism. There is no evidence of thoracic aortic dissection. There are scattered coronary artery calcifications. Scattered ground-glass and alveolar infiltrates are seen in both lungs, more so in the right lower lobes suggesting multifocal pneumonia. Electronically Signed   By: Elmer Picker M.D.   On: 11/29/2021 10:21   DG Chest 2 View  Result Date: 11/27/2021 CLINICAL DATA:  Shortness  of breath EXAM: CHEST - 2 VIEW COMPARISON:  08/22/2021, 11/26/2019, CT 2 08/23/2021 FINDINGS: Minimal heterogeneous basilar opacity. Stable cardiomediastinal silhouette. No pneumothorax. Aortic atherosclerosis. IMPRESSION: Mild heterogeneous airspace opacity at the bases could reflect atelectasis, pneumonia or aspiration Electronically Signed   By: Donavan Foil M.D.   On: 11/27/2021 20:55    Microbiology: Recent Results (from the past 240 hour(s))  Resp Panel by RT-PCR (Flu Zekiah Coen&B, Covid) Anterior Nasal Swab     Status: None   Collection Time: 11/27/21  8:00 PM   Specimen: Anterior Nasal Swab  Result Value Ref Range Status   SARS Coronavirus 2 by RT PCR NEGATIVE NEGATIVE Final    Comment: (NOTE) SARS-CoV-2 target nucleic acids are NOT DETECTED.  The SARS-CoV-2 RNA is generally detectable in upper respiratory specimens during the acute phase of infection. The lowest concentration of SARS-CoV-2 viral copies this assay can detect is 138 copies/mL. Lorielle Boehning negative result does not preclude SARS-Cov-2 infection and should not be used as the sole basis for treatment or other patient management decisions. Renessa Wellnitz negative result may occur  with  improper specimen collection/handling, submission of specimen other than nasopharyngeal swab, presence of viral mutation(s) within the areas targeted by this assay, and inadequate number of viral copies(<138 copies/mL). Cale Decarolis negative result must be combined with clinical observations, patient history, and epidemiological information. The expected result is Negative.  Fact Sheet for Patients:  EntrepreneurPulse.com.au  Fact Sheet for Healthcare Providers:  IncredibleEmployment.be  This test is no t yet approved or cleared by the [place] FDA and  has been authorized for detection and/or diagnosis of SARS-CoV-2 by FDA under an Emergency Use Authorization (EUA). This EUA will remain  in effect (meaning this test can be used) for the duration of the COVID-19 declaration under Section 564(b)(1) of the Act, 21 U.S.C.section 360bbb-3(b)(1), unless the authorization is terminated  or revoked sooner.       Influenza Gerlene Glassburn by PCR NEGATIVE NEGATIVE Final   Influenza B by PCR NEGATIVE NEGATIVE Final    Comment: (NOTE) The Xpert Xpress SARS-CoV-2/FLU/RSV plus assay is intended as an aid in the diagnosis of influenza from Nasopharyngeal swab specimens and should not be used as Tayo Maute sole basis for treatment. Nasal washings and aspirates are unacceptable for Xpert Xpress SARS-CoV-2/FLU/RSV testing.  Fact Sheet for Patients: EntrepreneurPulse.com.au  Fact Sheet for Healthcare Providers: IncredibleEmployment.be  This test is not yet approved or cleared by the [place] FDA and has been authorized for detection and/or diagnosis of SARS-CoV-2 by FDA under an Emergency Use Authorization (EUA). This EUA will remain in effect (meaning this test can be used) for the duration of the COVID-19 declaration under Section 564(b)(1) of the Act, 21 U.S.C. section 360bbb-3(b)(1), unless the authorization is terminated  or revoked.  Performed at [facility], [address], [place], [place] [address]   Blood culture (routine x 2)     Status: None (Preliminary result)   Collection Time: 11/27/21 10:15 PM   Specimen: BLOOD  Result Value Ref Range Status   Specimen Description   Final    BLOOD RIGHT ANTECUBITAL Performed at [facility], [address], [place], [place] [address]    Special Requests   Final    BOTTLES DRAWN AEROBIC AND ANAEROBIC Blood Culture adequate volume Performed at [facility], [address], [place], [place] [address]    Culture   Final    NO GROWTH 4 DAYS Performed at [facility], [place],  [place] [address]    Report Status PENDING  Incomplete  Blood culture (routine x 2)     Status: Abnormal   Collection Time: 11/27/21 10:57 PM   Specimen: BLOOD  Result Value Ref Range Status   Specimen Description   Final    BLOOD LEFT ARM Performed at [facility], [address] [place], [place] [address]    Special Requests   Final    BOTTLES DRAWN AEROBIC AND ANAEROBIC Blood Culture adequate volume Performed at [facility], [address], [place], [place] [address]    Culture  Setup Time (Shatisha Falter)  Final    GRAM VARIABLE ROD AEROBIC BOTTLE ONLY CRITICAL RESULT CALLED TO, READ BACK BY AND VERIFIED WITH: T [email or web address]  11/29/21 [place]    Culture (Teo Moede)  Final    BACILLUS SPECIES Standardized susceptibility testing for this organism is not available. Performed at [facility], [place] [address], [place], [place] [address]    Report Status 12/01/2021 FINAL  Final  MRSA Next Gen by PCR, Nasal     Status: None   Collection Time: 11/28/21  4:15 PM  Result Value Ref Range Status   MRSA by PCR Next Gen NOT DETECTED NOT DETECTED Final    Comment: (NOTE) The GeneXpert MRSA Assay (FDA approved for NASAL specimens only), is one component of Etheridge Geil comprehensive  MRSA colonization surveillance program. It is not intended to diagnose MRSA infection nor to guide or monitor treatment for MRSA infections. Test performance is not FDA approved in patients less than 58 years old. Performed at [facility], [place] [address] [place], [place] [address]      Labs: Basic Metabolic Panel: Recent Labs  Lab 11/28/21 1603 11/29/21 0127 11/30/21 0229 12/01/21 0406 12/02/21 0329  NA 131* 132* 135 133* 134*  K 3.5 3.3* 4.1 3.4* 4.2  CL 102 96* 102 101 102  CO2 24 22 22 23 25   GLUCOSE 318* 251* 201* 265* 276*  BUN 14 15 11 10 8   CREATININE 0.98 0.93 0.94 0.92 1.00  CALCIUM 8.1* 8.8* 8.7* 8.6* 8.6*  MG  --  1.9 1.7 1.8 2.2   Liver Function Tests: Recent Labs  Lab 11/27/21 2000  AST 26  ALT 23  ALKPHOS 119  BILITOT 0.8  PROT 7.9  ALBUMIN 4.2   No results for input(s): "LIPASE", "AMYLASE" in the last 168 hours. No results for input(s): "AMMONIA" in the last 168 hours. CBC: Recent Labs  Lab 11/27/21 2000 11/29/21 0127 11/30/21 0229 12/01/21 0406 12/02/21 0329  WBC 13.0* 12.6* 14.3* 10.9* 9.2  NEUTROABS 9.0*  --  11.2* 8.0* 6.6  HGB 14.1 12.8 12.6 12.0 11.7*  HCT 41.2 38.2 36.9 35.6* 34.7*  MCV 90.0 91.2 90.7 91.8 91.6  PLT 353 304 327 345 343   Cardiac Enzymes: No results for input(s): "CKTOTAL", "CKMB", "CKMBINDEX", "TROPONINI" in the last 168 hours. BNP: BNP (last 3 results) Recent Labs    08/22/21 1540 11/28/21 1603 12/01/21 0406  BNP 46.5 72.1 26.6    ProBNP (last 3 results) No results for input(s): "PROBNP" in the last 8760 hours.  CBG: Recent Labs  Lab 12/01/21 1136 12/01/21 1609 12/01/21 2046 12/02/21 0842 12/02/21 1122  GLUCAP 239* 281* 279* 241* 263*       Signed:  Fayrene Helper MD.  [facility] 12/02/2021, 2:46 PM

## 2021-12-02 NOTE — Inpatient Diabetes Management (Addendum)
Inpatient Diabetes Program Recommendations  AACE/ADA: New Consensus Statement on Inpatient Glycemic Control (2015)  Target Ranges:  Prepandial:   less than 140 mg/dL      Peak postprandial:   less than 180 mg/dL (1-2 hours)      Critically ill patients:  140 - 180 mg/dL   Lab Results  Component Value Date   GLUCAP 263 (H) 12/02/2021   HGBA1C 9.3 (H) 12/01/2021    Review of Glycemic Control  Latest Reference Range & Units 12/01/21 16:09 12/01/21 20:46 12/02/21 08:42 12/02/21 11:22  Glucose-Capillary 70 - 99 mg/dL 281 (H) 279 (H) 241 (H) 263 (H)  (H): Data is abnormally high Diabetes history: Type 2 DM Outpatient Diabetes medications: Glipizide 5 mg QA Current orders for Inpatient glycemic control: Semglee 22 units QD, Novolog 0-15 units TID & HS   Inpatient Diabetes Program Recommendations:     Consider: Further increasing Semglee to 26 units QD.   Addendum: Spoke with patient regarding outpatient diabetes management. Patient reports poor control within the last year because she hasn't been taking care of herself well.  Reviewed patient's current A1c of 9.3%. Explained what a A1c is and what it measures. Also reviewed goal A1c with patient, importance of good glucose control @ home, and blood sugar goals. Reviewed patho of DM, risk of infection with poor glycemic control, vascular changes and commorbidities. Patient has a meter but has not been using. Frequency reviewed with patient and when to call MD. Patient plans to make appointment with PCP.  Admits to drinking sodas. Reviewed alternatives and importance of CHO mindfulness.  Patient has no questions at this time. Encouraged to take medications as ordered.    Thanks, Bronson Curb, MSN, RNC-OB Diabetes Coordinator [phone] (8a-5p)

## 2021-12-02 NOTE — Progress Notes (Signed)
Mobility Specialist Criteria Algorithm Info.   12/02/21 1130  Mobility  Activity Ambulated with assistance in hallway;Dangled on edge of bed  Range of Motion/Exercises Active;All extremities  Level of Assistance Standby assist, set-up cues, supervision of patient - no hands on  Assistive Device None  Distance Ambulated (ft) 1800 ft (+)  Activity Response Tolerated well   Patient received dangling EOB eager to participate in mobility. Ambulated supervision level for approximately 30 minutes with slow steady gait. Returned to room without complaint or incident. Was left dangling EOB with all needs met, call bell in reach.   [place] Lenell Lama, [place], [place]  EZVGJ:[phone] Office: [phone]

## 2021-12-02 NOTE — Plan of Care (Signed)
  Problem: Activity: Goal: Ability to tolerate increased activity will improve Outcome: Adequate for Discharge   Problem: Clinical Measurements: Goal: Ability to maintain a body temperature in the normal range will improve Outcome: Adequate for Discharge   Problem: Respiratory: Goal: Ability to maintain adequate ventilation will improve Outcome: Adequate for Discharge Goal: Ability to maintain a clear airway will improve Outcome: Adequate for Discharge   Problem: Education: Goal: Ability to describe self-care measures that may prevent or decrease complications (Diabetes Survival Skills Education) will improve Outcome: Adequate for Discharge Goal: Individualized Educational Video(s) Outcome: Adequate for Discharge   Problem: Coping: Goal: Ability to adjust to condition or change in health will improve Outcome: Adequate for Discharge   Problem: Fluid Volume: Goal: Ability to maintain a balanced intake and output will improve Outcome: Adequate for Discharge   Problem: Health Behavior/Discharge Planning: Goal: Ability to identify and utilize available resources and services will improve Outcome: Adequate for Discharge Goal: Ability to manage health-related needs will improve Outcome: Adequate for Discharge   Problem: Metabolic: Goal: Ability to maintain appropriate glucose levels will improve Outcome: Adequate for Discharge   Problem: Nutritional: Goal: Maintenance of adequate nutrition will improve Outcome: Adequate for Discharge Goal: Progress toward achieving an optimal weight will improve Outcome: Adequate for Discharge   Problem: Skin Integrity: Goal: Risk for impaired skin integrity will decrease Outcome: Adequate for Discharge   Problem: Tissue Perfusion: Goal: Adequacy of tissue perfusion will improve Outcome: Adequate for Discharge   Problem: Education: Goal: Knowledge of General Education information will improve Description: Including pain rating scale,  medication(s)/side effects and non-pharmacologic comfort measures Outcome: Adequate for Discharge   Problem: Health Behavior/Discharge Planning: Goal: Ability to manage health-related needs will improve Outcome: Adequate for Discharge   Problem: Clinical Measurements: Goal: Ability to maintain clinical measurements within normal limits will improve Outcome: Adequate for Discharge Goal: Will remain free from infection Outcome: Adequate for Discharge Goal: Diagnostic test results will improve Outcome: Adequate for Discharge Goal: Respiratory complications will improve Outcome: Adequate for Discharge Goal: Cardiovascular complication will be avoided Outcome: Adequate for Discharge   Problem: Activity: Goal: Risk for activity intolerance will decrease Outcome: Adequate for Discharge   Problem: Nutrition: Goal: Adequate nutrition will be maintained Outcome: Adequate for Discharge   Problem: Coping: Goal: Level of anxiety will decrease Outcome: Adequate for Discharge   Problem: Elimination: Goal: Will not experience complications related to bowel motility Outcome: Adequate for Discharge Goal: Will not experience complications related to urinary retention Outcome: Adequate for Discharge   Problem: Pain Managment: Goal: General experience of comfort will improve Outcome: Adequate for Discharge   Problem: Safety: Goal: Ability to remain free from injury will improve Outcome: Adequate for Discharge   Problem: Skin Integrity: Goal: Risk for impaired skin integrity will decrease Outcome: Adequate for Discharge   

## 2021-12-03 ENCOUNTER — Telehealth: Payer: Self-pay

## 2021-12-03 LAB — CULTURE, BLOOD (ROUTINE X 2)
Culture: NO GROWTH
Special Requests: ADEQUATE

## 2021-12-03 NOTE — Telephone Encounter (Signed)
Transition Care Management Unsuccessful Follow-up Telephone Call  Date of discharge and from where:  [facility] 12/02/21  Attempts:  1st Attempt  Reason for unsuccessful TCM follow-up call:  Left voice message

## 2021-12-04 ENCOUNTER — Telehealth: Payer: Self-pay

## 2021-12-04 NOTE — Telephone Encounter (Signed)
Transition Care Management Follow-up Telephone Call Date of discharge and from where: [place] 12/02/21 How have you been since you were released from the hospital? ok Any questions or concerns? No  Items Reviewed: Did the pt receive and understand the discharge instructions provided? Yes  Medications obtained and verified? Yes  Other? No  Any new allergies since your discharge? No  Dietary orders reviewed? Yes Do you have support at home? Yes   Home Care and Equipment/Supplies: Were home health services ordered? not applicable If so, what is the name of the agency?   Has the agency set up a time to come to the patient's home? not applicable Were any new equipment or medical supplies ordered?  No What is the name of the medical supply agency?  Were you able to get the supplies/equipment? not applicable Do you have any questions related to the use of the equipment or supplies? No  Functional Questionnaire: (I = Independent and D = Dependent) ADLs: I  Bathing/Dressing- I  Meal Prep- I  Eating- I  Maintaining continence- I  Transferring/Ambulation- I  Managing Meds- I  Follow up appointments reviewed:  PCP Hospital f/u appt confirmed? Yes  Scheduled to see Jeanie Sewer  on 12/07/21 @ 11. [facility] f/u appt confirmed? No  Scheduled  Are transportation arrangements needed? No  If their condition worsens, is the pt aware to call PCP or go to the Emergency Dept.? Yes Was the patient provided with contact information for the PCP's office or ED? Yes Was to pt encouraged to call back with questions or concerns? Yes

## 2021-12-07 ENCOUNTER — Encounter: Payer: Self-pay | Admitting: Family

## 2021-12-07 ENCOUNTER — Encounter: Payer: Self-pay | Admitting: *Deleted

## 2021-12-07 ENCOUNTER — Ambulatory Visit (INDEPENDENT_AMBULATORY_CARE_PROVIDER_SITE_OTHER): Payer: Medicare Other | Admitting: Family

## 2021-12-07 VITALS — BP 138/84 | HR 104 | Temp 97.7°F | Ht 59.0 in | Wt 151.4 lb

## 2021-12-07 DIAGNOSIS — F5101 Primary insomnia: Secondary | ICD-10-CM | POA: Diagnosis not present

## 2021-12-07 DIAGNOSIS — Z09 Encounter for follow-up examination after completed treatment for conditions other than malignant neoplasm: Secondary | ICD-10-CM

## 2021-12-07 DIAGNOSIS — E1165 Type 2 diabetes mellitus with hyperglycemia: Secondary | ICD-10-CM

## 2021-12-07 DIAGNOSIS — J189 Pneumonia, unspecified organism: Secondary | ICD-10-CM

## 2021-12-07 MED ORDER — TRAZODONE HCL 50 MG PO TABS: 25.0000 mg | ORAL_TABLET | Freq: Every evening | ORAL | 2 refills | Status: DC | PRN

## 2021-12-07 MED ORDER — DAPAGLIFLOZIN PROPANEDIOL 5 MG PO TABS: 5.0000 mg | ORAL_TABLET | Freq: Every day | ORAL | 1 refills | Status: DC

## 2021-12-07 NOTE — Assessment & Plan Note (Addendum)
   chronic  pt missed 3 mos f/u appt d/t hospital stay for 2nd bout of PNA  Glipizide increased to '5mg'$  bid ER at d/c & started on Metformin '500mg'$  qd, & Januvia '50mg'$  qd, pt so far is tolerating  advised pt again on goal CBGs: <120 fasting, <150 supper  sending Wilder Glade to start, will stop Januvia if covered, pt to let me know if too expensive.  monitor fasting and pre-supper CBGs  f/u in 1 month

## 2021-12-07 NOTE — Assessment & Plan Note (Signed)
   2nd episode this year, hospital for 7 days  finished Cefdinir given at d/c  advised pt on continuing to use IS and flutter valve, pt brought in with her meds today  advised on drinking at least 2L of water qd, can take several weeks to a month to get over fatigue  advised to call the office if cough worsens or she develops fever again

## 2021-12-07 NOTE — Assessment & Plan Note (Signed)
   chronic - intermittent  recently d/c from hospital and having trouble sleeping again  refilling Trazodone today  advised to take only prn for 1-2 weeks if possible  f/u in 6 mos or prn

## 2021-12-07 NOTE — Patient Instructions (Signed)
It was very nice to see you today!  Continue all of your medicines. Check your fasting blood sugar (should be less than 120) and check a blood sugar before supper (should be less than 170). If your numbers are not coming down, let me know. I am sending Wilder Glade to help your kidneys and your diabetes, this will replace your Januvia that the hospital put you on, but ok to take both for now.  If the Wilder Glade is too expensive, let me know!  Use the incentive spirometer (blue & clear plastic device) and the flutter valve (green device) several times per day for another week or so until you stop coughing and feel better. Remember to drink at least 2 Liters = 8 cups of water every day!  I have sent over the sleep medication, Trazodone, take 1/2 pill as needed at bedtime.  Schedule a 1 month follow up visit and bring your glucometer with you!       PLEASE NOTE:  If you had any lab tests please let us know if you have not heard back within a few days. You may see your results on MyChart before we have a chance to review them but we will give you a call once they are reviewed by [place]. If we ordered any referrals today, please let us know if you have not heard from their office within the next week.

## 2021-12-07 NOTE — Progress Notes (Signed)
Patient ID: Misty Cervantes, female    DOB: June 25, 1945, 76 y.o.   MRN: [id number]  Chief Complaint  Patient presents with   Hospitalization Follow-up    Admitted 9/15 and discharged on 9/21. Pt was diagnosed with pneumonia.     HPI: Follow up Hospitalization Patient was admitted to [facility] on 9/15 and discharged on 12/03/2021. She was treated for PNA. Treatment for this included IV abt & fluids. Telephone follow up was done. She reports good compliance with treatment. She reports this condition is improved. She is still coughing some, but not as bad, no mucus, denies any fever, c/o fatigue.  T2DM: Pt is currently maintained on the following medications for diabetes: Glipizide Metformin, & Januvia.  Denies polyuria/polydipsia/polyphagia Denies hypoglycemia Home glucose readings range: none at home, but hospital CBGs were 250-300, given insulin and d/c home with Metformin, Januvia & increased Glipizde. Last A1C was  Lab Results  Component Value Date   HGBA1C 9.3 (H) 12/01/2021   HGBA1C 9.2 (H) 08/23/2021   HGBA1C 7.6 (H) 01/22/2021    Assessment & Plan:   Problem List Items Addressed This Visit       Respiratory   Pneumonia of both lower lobes due to infectious organism    2nd episode this year, hospital for 7 days finished Cefdinir given at d/c advised pt on continuing to use IS and flutter valve, pt brought in with her meds today advised on drinking at least 2L of water qd, can take several weeks to a month to get over fatigue advised to call the office if cough worsens or she develops fever again        Endocrine   Type 2 diabetes mellitus with hyperglycemia, without long-term current use of insulin ([place]) - Primary    chronic pt missed 3 mos f/u appt d/t hospital stay for 2nd bout of PNA Glipizide increased to 20m bid ER at d/c & started on Metformin 5066mqd, & Januvia 5031md, pt so far is tolerating advised pt again on goal CBGs: <120 fasting, <150  supper sending FarWilder Glade start, will stop Januvia if covered, pt to let me know if too expensive. monitor fasting and pre-supper CBGs f/u in 1 month      Relevant Medications   dapagliflozin propanediol (FARXIGA) 5 MG TABS tablet     Other   Primary insomnia    chronic - intermittent recently d/c from hospital and having trouble sleeping again refilling Trazodone today advised to take only prn for 1-2 weeks if possible f/u in 6 mos or prn      Relevant Medications   traZODone (DESYREL) 50 MG tablet   Other Visit Diagnoses     Hospital discharge follow-up    - 9/15-9/21/2023 for PNA. pt feeling better, still fatigued, mild non-productive cough, finished Cefdinir, using IS & flutter valve daily.      Subjective:    Outpatient Medications Prior to Visit  Medication Sig Dispense Refill   Accu-Chek FastClix Lancets MISC Check blood sugars 1-2 times per day. 100 each 2   acetaminophen (TYLENOL) 500 MG tablet Take 500-1,000 mg by mouth 2 (two) times daily as needed for headache or mild pain.     ALAVERT 10 MG dissolvable tablet Take 10 mg by mouth daily as needed for allergies.     aspirin EC 81 MG tablet Take 81 mg by mouth daily. Swallow whole.     Blood Glucose Monitoring Suppl (ACCU-CHEK GUIDE) w/Device KIT Use as needed 1  kit 0   Cyanocobalamin (B-12 PO) Take 1 tablet by mouth daily.     fluticasone (FLONASE) 50 MCG/ACT nasal spray SPRAY 1 SPRAY INTO EACH NOSTRIL TWICE A DAY AS NEEDED FOR ALLERGIES OR RHINITIS (Patient taking differently: Place 1 spray into both nostrils 2 (two) times daily as needed for allergies or rhinitis.) 48 mL 0   glipiZIDE (GLUCOTROL XL) 5 MG 24 hr tablet Take 1 tablet (5 mg total) by mouth 2 (two) times daily before a meal. 60 tablet 0   glucose blood (ACCU-CHEK GUIDE) test strip TEST FOUR TIMES DAILY 100 strip 0   losartan (COZAAR) 50 MG tablet Take 1 tablet (50 mg total) by mouth daily. 90 tablet 1   meclizine (ANTIVERT) 25 MG tablet TAKE 1 TABLET  BY MOUTH 3 TIMES A DAY AS NEEDED FOR DIZZINESS 60 tablet 1   metFORMIN (GLUCOPHAGE) 500 MG tablet Take 1 tablet (500 mg total) by mouth daily with breakfast. 30 tablet 0   Multiple Vitamin (MULTIVITAMIN WITH MINERALS) TABS tablet Take 1 tablet by mouth daily with breakfast.     pravastatin (PRAVACHOL) 40 MG tablet Take 1 tablet (40 mg total) by mouth at bedtime. 90 tablet 1   sitaGLIPtin (JANUVIA) 50 MG tablet Take 1 tablet (50 mg total) by mouth daily. 30 tablet 0   No facility-administered medications prior to visit.   Past Medical History:  Diagnosis Date   Chronic kidney disease (CKD), stage III (moderate) ([place])    followed by pcp   Full dentures    Headache 08/23/2021   History of 2019 novel coronavirus disease (COVID-19) 04/05/2019   result in care everywhere ,  per pt mild symptoms that resolved   History of gastric ulcer 2013   non-bleeding   Hyperlipemia    Low serum potassium level 01/29/2021   Mixed stress and urge incontinence    Osteoarthritis    Presence of pessary    Respiratory distress 11/28/2021   Type 2 diabetes mellitus ([place])    followed by pcp   (08-26-2020 per pt checks blood sugar twice weekly, unsure what fasting sugar is)   UTI (urinary tract infection) 08/23/2021   Vaginal vault 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    gyn--- dr Talbert Nan, using pessary   Wears glasses    Past Surgical History:  Procedure Laterality Date   CARPAL TUNNEL RELEASE Right    1980s   CATARACT EXTRACTION W/ INTRAOCULAR LENS IMPLANT Bilateral 2013   approx   COLONOSCOPY WITH ESOPHAGOGASTRODUODENOSCOPY (EGD)  11/17/2011   COLPOCLEISIS N/A 09/02/2020   Procedure: COLPOCLEISIS, PERINEORRHAPHY;  Surgeon: Salvadore Dom, MD;  Location: [facility];  Service: Gynecology;  Laterality: N/A;   CYSTOSCOPY N/A 09/02/2020   Procedure: CYSTOSCOPY;  Surgeon: Salvadore Dom, MD;  Location: [facility];  Service: Gynecology;  Laterality: N/A;   RETINAL DETACHMENT  SURGERY Right 2003   TOTAL HIP ARTHROPLASTY Left 03/02/2010   '@[facility]'$    TOTAL KNEE ARTHROPLASTY Right 11/03/2015   Procedure: RIGHT TOTAL KNEE ARTHROPLASTY;  Surgeon: Gaynelle Arabian, MD;  Location: [facility];  Service: Orthopedics;  Laterality: Right;   TOTAL VAGINAL HYSTERECTOMY  2012   TVH, anterior repair, TVT (no mesh used for the anterior repair)   Allergies  Allergen Reactions   Latex Other (See Comments)    Exact reaction not cited   Norvasc [Amlodipine] Swelling      Objective:    Physical Exam Vitals and nursing note reviewed.  Constitutional:      Appearance: Normal  appearance.  Cardiovascular:     Rate and Rhythm: Normal rate and regular rhythm.  Pulmonary:     Effort: Pulmonary effort is normal.     Breath sounds: Normal breath sounds.  Musculoskeletal:        General: Normal range of motion.  Skin:    General: Skin is warm and dry.  Neurological:     Mental Status: She is alert.  Psychiatric:        Mood and Affect: Mood normal.        Behavior: Behavior normal.    BP 138/84 (BP Location: Left Arm, Patient Position: Sitting, Cuff Size: Large)   Pulse (!) 104   Temp 97.7 F (36.5 C) (Temporal)   Ht 4' 11"  (1.499 m)   Wt 151 lb 6.4 oz (68.7 kg)   LMP  (LMP Unknown)   SpO2 91%   BMI 30.58 kg/m  Wt Readings from Last 3 Encounters:  12/07/21 151 lb 6.4 oz (68.7 kg)  12/01/21 153 lb 10.6 oz (69.7 kg)  09/01/21 149 lb 6 oz (67.8 kg)       Jeanie Sewer, NP

## 2021-12-11 ENCOUNTER — Telehealth: Payer: Self-pay | Admitting: Family

## 2021-12-11 DIAGNOSIS — E1165 Type 2 diabetes mellitus with hyperglycemia: Secondary | ICD-10-CM

## 2021-12-11 NOTE — Telephone Encounter (Signed)
Pt states: -She was instructed to call in with information. -Wilder Glade is cost prohibitive  Pt requests: -less expensive Rx to be sent in to [facility] in [place].

## 2021-12-13 NOTE — Telephone Encounter (Signed)
please send in [place] '10mg'$  qd, under her diabetes diagnosis  & d/c the farxiga - thx

## 2021-12-14 ENCOUNTER — Other Ambulatory Visit: Payer: Self-pay

## 2021-12-14 MED ORDER — EMPAGLIFLOZIN 10 MG PO TABS: 10.0000 mg | ORAL_TABLET | Freq: Every day | ORAL | 1 refills | Status: DC

## 2021-12-14 NOTE — Telephone Encounter (Signed)
RX sent, Lvm letting pt know.

## 2021-12-29 ENCOUNTER — Other Ambulatory Visit: Payer: Self-pay | Admitting: Family

## 2021-12-29 DIAGNOSIS — F5101 Primary insomnia: Secondary | ICD-10-CM

## 2022-01-05 ENCOUNTER — Encounter: Payer: Self-pay | Admitting: Internal Medicine

## 2022-01-06 ENCOUNTER — Encounter: Payer: Self-pay | Admitting: Family

## 2022-01-06 ENCOUNTER — Ambulatory Visit (INDEPENDENT_AMBULATORY_CARE_PROVIDER_SITE_OTHER): Payer: Medicare Other | Admitting: Family

## 2022-01-06 VITALS — BP 148/82 | HR 84 | Temp 97.8°F | Ht 59.0 in | Wt 151.0 lb

## 2022-01-06 DIAGNOSIS — E1159 Type 2 diabetes mellitus with other circulatory complications: Secondary | ICD-10-CM

## 2022-01-06 DIAGNOSIS — E1165 Type 2 diabetes mellitus with hyperglycemia: Secondary | ICD-10-CM

## 2022-01-06 DIAGNOSIS — I152 Hypertension secondary to endocrine disorders: Secondary | ICD-10-CM

## 2022-01-06 MED ORDER — METFORMIN HCL ER 500 MG PO TB24: 500.0000 mg | ORAL_TABLET | Freq: Every day | ORAL | 1 refills | Status: DC

## 2022-01-06 NOTE — Patient Instructions (Addendum)
It was very nice to see you today!   Continue to take the Glipizide twice a day.  I am refilling your Metformin once a day, I have sent in a refill. Call the number on the handout I gave you to see if you can get assistance with the cost of the [place]. Your fasting blood sugar should be less than 120, but not below 60. Your blood sugar before supper should be less than 150, and not below 60.  Schedule a 2 month follow up.   PLEASE NOTE:  If you had any lab tests please let us know if you have not heard back within a few days. You may see your results on MyChart before we have a chance to review them but we will give you a call once they are reviewed by [place]. If we ordered any referrals today, please let us know if you have not heard from their office within the next week.

## 2022-01-06 NOTE — Assessment & Plan Note (Signed)
   chronic  pt taking Metformin '500mg'$  IR & Glipizide '5mg'$  bid  avg BS at home is 144  reminded pt of goal CBGs fasting and pre-prandial  provided brochure on Medicare discount/assistance options and phone # to call re: Farxiga assistance  refilling Metformin  performed foot exam  f/u in 2 mos.

## 2022-01-06 NOTE — Progress Notes (Signed)
Patient ID: Misty Cervantes, female    DOB: May 09, 1945, 76 y.o.   MRN: [id number]  Chief Complaint  Patient presents with   Diabetes    1 month follow up, c/o dizziness   HPI: Hypertension: Patient is currently maintained on the following medications for blood pressure: Losartan 61m qd Failed meds include: HCTZ -  hyponatremia Patient reports good compliance with blood pressure medications. Patient denies chest pain, headaches, shortness of breath or swelling. Last 3 blood pressure readings in our office are as follows: BP Readings from Last 3 Encounters:  01/06/22 (!) 148/82  12/07/21 138/84  12/02/21 137/85   T2DM: Pt is currently maintained on the following medications for diabetes: Glipizide Metformin (ran out a week ago) - [place], so she did not pick this up. Denies polyuria/polydipsia/polyphagia, reports occ dizziness. Denies hypoglycemia Home glucose readings range: per pt glucometer she brought in her average daily CBG is 144.  Assessment & Plan:   Problem List Items Addressed This Visit       Cardiovascular and Mediastinum   Hypertension associated with diabetes ([place]    chronic taking Losartan 559mqd reports occ dizziness BP elevated today, pt has not taken med yet advised when feeling dizziness she should check her BP and her CBG and let me know if these are abnormal. no refill needed today f/u in 2 mos      Relevant Medications   metFORMIN (GLUCOPHAGE-XR) 500 MG 24 hr tablet     Endocrine   Type 2 diabetes mellitus with hyperglycemia, without long-term current use of insulin ([place]- Primary    chronic pt taking Metformin 50037mR & Glipizide 5mg86md avg BS at home is 144 reminded pt of goal CBGs fasting and pre-prandial provided brochure on Medicare discount/assistance options and phone # to call re: Farxiga assistance refilling Metformin performed foot exam f/u in 2 mos.      Relevant  Medications   metFORMIN (GLUCOPHAGE-XR) 500 MG 24 hr tablet   Other Relevant Orders   HM DIABETES FOOT EXAM (Completed)   Subjective:    Outpatient Medications Prior to Visit  Medication Sig Dispense Refill   Accu-Chek FastClix Lancets MISC Check blood sugars 1-2 times per day. 100 each 2   acetaminophen (TYLENOL) 500 MG tablet Take 500-1,000 mg by mouth 2 (two) times daily as needed for headache or mild pain.     ALAVERT 10 MG dissolvable tablet Take 10 mg by mouth daily as needed for allergies.     aspirin EC 81 MG tablet Take 81 mg by mouth daily. Swallow whole.     Blood Glucose Monitoring Suppl (ACCU-CHEK GUIDE) w/Device KIT Use as needed 1 kit 0   Cyanocobalamin (B-12 PO) Take 1 tablet by mouth daily.     empagliflozin (JARDIANCE) 10 MG TABS tablet Take 1 tablet (10 mg total) by mouth daily before breakfast. 90 tablet 1   fluticasone (FLONASE) 50 MCG/ACT nasal spray SPRAY 1 SPRAY INTO EACH NOSTRIL TWICE A DAY AS NEEDED FOR ALLERGIES OR RHINITIS (Patient taking differently: Place 1 spray into both nostrils 2 (two) times daily as needed for allergies or rhinitis.) 48 mL 0   glucose blood (ACCU-CHEK GUIDE) test strip TEST FOUR TIMES DAILY 100 strip 0   meclizine (ANTIVERT) 25 MG tablet TAKE 1 TABLET BY MOUTH 3 TIMES A DAY AS NEEDED FOR DIZZINESS 60 tablet 1   Multiple Vitamin (MULTIVITAMIN WITH MINERALS) TABS tablet Take 1  tablet by mouth daily with breakfast.     pravastatin (PRAVACHOL) 40 MG tablet Take 1 tablet (40 mg total) by mouth at bedtime. 90 tablet 1   traZODone (DESYREL) 50 MG tablet TAKE 0.5-1 TABLETS (25-50 MG TOTAL) BY MOUTH AT BEDTIME AS NEEDED. FOR SLEEP 90 tablet 1   glipiZIDE (GLUCOTROL XL) 5 MG 24 hr tablet Take 1 tablet (5 mg total) by mouth 2 (two) times daily before a meal. 60 tablet 0   losartan (COZAAR) 50 MG tablet Take 1 tablet (50 mg total) by mouth daily. (Patient not taking: Reported on 01/06/2022) 90 tablet 1   metFORMIN (GLUCOPHAGE) 500 MG tablet Take 1  tablet (500 mg total) by mouth daily with breakfast. 30 tablet 0   sitaGLIPtin (JANUVIA) 50 MG tablet Take 1 tablet (50 mg total) by mouth daily. 30 tablet 0   No facility-administered medications prior to visit.   Past Medical History:  Diagnosis Date   Chronic kidney disease (CKD), stage III (moderate) ([place])    followed by pcp   Full dentures    Headache 08/23/2021   History of 2019 novel coronavirus disease (COVID-19) 04/05/2019   result in care everywhere ,  per pt mild symptoms that resolved   History of gastric ulcer 2013   non-bleeding   Hyperlipemia    Hypokalemia    Hyponatremia 08/23/2021   Low serum potassium level 01/29/2021   Mixed stress and urge incontinence    Osteoarthritis    Presence of pessary    Respiratory distress 11/28/2021   Type 2 diabetes mellitus ([place])    followed by pcp   (08-26-2020 per pt checks blood sugar twice weekly, unsure what fasting sugar is)   UTI (urinary tract infection) 08/23/2021   Vaginal vault 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    gyn--- dr Talbert Nan, using pessary   Wears glasses    Past Surgical History:  Procedure Laterality Date   CARPAL TUNNEL RELEASE Right    1980s   CATARACT EXTRACTION W/ INTRAOCULAR LENS IMPLANT Bilateral 2013   approx   COLONOSCOPY WITH ESOPHAGOGASTRODUODENOSCOPY (EGD)  11/17/2011   COLPOCLEISIS N/A 09/02/2020   Procedure: COLPOCLEISIS, PERINEORRHAPHY;  Surgeon: Salvadore Dom, MD;  Location: [facility];  Service: Gynecology;  Laterality: N/A;   CYSTOSCOPY N/A 09/02/2020   Procedure: CYSTOSCOPY;  Surgeon: Salvadore Dom, MD;  Location: [facility];  Service: Gynecology;  Laterality: N/A;   RETINAL DETACHMENT SURGERY Right 2003   TOTAL HIP ARTHROPLASTY Left 03/02/2010   @[facility]    TOTAL KNEE ARTHROPLASTY Right 11/03/2015   Procedure: RIGHT TOTAL KNEE ARTHROPLASTY;  Surgeon: Gaynelle Arabian, MD;  Location: [facility];  Service: Orthopedics;  Laterality: Right;   TOTAL VAGINAL  HYSTERECTOMY  2012   TVH, anterior repair, TVT (no mesh used for the anterior repair)   Allergies  Allergen Reactions   Latex Other (See Comments)    Exact reaction not cited   Norvasc [Amlodipine] Swelling      Objective:    Physical Exam Vitals and nursing note reviewed.  Constitutional:      Appearance: Normal appearance.  Cardiovascular:     Rate and Rhythm: Normal rate and regular rhythm.     Pulses:          Dorsalis pedis pulses are 2+ on the right side and 2+ on the left side.  Pulmonary:     Effort: Pulmonary effort is normal.     Breath sounds: Normal breath sounds.  Musculoskeletal:  General: Normal range of motion.  Feet:     Right foot:     Protective Sensation: 10 sites tested.  9 sites sensed.     Skin integrity: Skin integrity normal. No callus.     Left foot:     Protective Sensation: 10 sites tested.  10 sites sensed.     Skin integrity: Skin integrity normal. No callus.  Skin:    General: Skin is warm and dry.  Neurological:     Mental Status: She is alert.  Psychiatric:        Mood and Affect: Mood normal.        Behavior: Behavior normal.    BP (!) 148/82 (BP Location: Left Arm, Patient Position: Sitting, Cuff Size: Large)   Pulse 84   Temp 97.8 F (36.6 C) (Temporal)   Ht 4' 11"  (1.499 m)   Wt 151 lb (68.5 kg)   LMP  (LMP Unknown)   SpO2 97%   BMI 30.50 kg/m  Wt Readings from Last 3 Encounters:  01/06/22 151 lb (68.5 kg)  12/07/21 151 lb 6.4 oz (68.7 kg)  12/01/21 153 lb 10.6 oz (69.7 kg)       Jeanie Sewer, NP

## 2022-01-06 NOTE — Assessment & Plan Note (Signed)
   chronic  taking Losartan '50mg'$  qd  reports occ dizziness  BP elevated today, pt has not taken med yet  advised when feeling dizziness she should check her BP and her CBG and let me know if these are abnormal.  no refill needed today  f/u in 2 mos

## 2022-01-07 ENCOUNTER — Telehealth: Payer: Self-pay | Admitting: Family

## 2022-01-07 NOTE — Telephone Encounter (Signed)
Patient requests to be called at ph# [phone]  or sent a message re:  Patient states she handed a paper with price of medications from the Hospital and needs it. Patient requests to be advised if the above paper is still here.

## 2022-01-08 NOTE — Telephone Encounter (Signed)
I called pt and Lvm in regards

## 2022-01-08 NOTE — Telephone Encounter (Signed)
Patient returned call. Patient requests to be called at ph# [phone]

## 2022-01-08 NOTE — Telephone Encounter (Signed)
I called pt and LVM in regards, Having trouble getting in touch with pt. Let her know she may also reach out through [facility].

## 2022-01-14 ENCOUNTER — Other Ambulatory Visit: Payer: Self-pay | Admitting: Family

## 2022-01-14 DIAGNOSIS — J069 Acute upper respiratory infection, unspecified: Secondary | ICD-10-CM

## 2022-03-03 ENCOUNTER — Other Ambulatory Visit: Payer: Self-pay | Admitting: Family

## 2022-03-03 DIAGNOSIS — E1159 Type 2 diabetes mellitus with other circulatory complications: Secondary | ICD-10-CM

## 2022-03-28 ENCOUNTER — Other Ambulatory Visit: Payer: Self-pay | Admitting: Family

## 2022-03-28 DIAGNOSIS — E1165 Type 2 diabetes mellitus with hyperglycemia: Secondary | ICD-10-CM

## 2022-03-28 DIAGNOSIS — I152 Hypertension secondary to endocrine disorders: Secondary | ICD-10-CM

## 2022-04-08 ENCOUNTER — Other Ambulatory Visit: Payer: Self-pay | Admitting: Family

## 2022-04-08 DIAGNOSIS — J069 Acute upper respiratory infection, unspecified: Secondary | ICD-10-CM

## 2022-04-12 ENCOUNTER — Other Ambulatory Visit: Payer: Self-pay | Admitting: Family

## 2022-04-12 DIAGNOSIS — E1169 Type 2 diabetes mellitus with other specified complication: Secondary | ICD-10-CM

## 2022-04-27 ENCOUNTER — Other Ambulatory Visit: Payer: Self-pay | Admitting: Family

## 2022-04-27 DIAGNOSIS — E1165 Type 2 diabetes mellitus with hyperglycemia: Secondary | ICD-10-CM

## 2022-04-27 DIAGNOSIS — J069 Acute upper respiratory infection, unspecified: Secondary | ICD-10-CM

## 2022-05-03 ENCOUNTER — Encounter: Payer: Self-pay | Admitting: Family

## 2022-05-03 ENCOUNTER — Ambulatory Visit (INDEPENDENT_AMBULATORY_CARE_PROVIDER_SITE_OTHER): Payer: Medicare Other | Admitting: Family

## 2022-05-03 VITALS — BP 151/79 | HR 93 | Temp 97.4°F | Ht 59.0 in | Wt 155.2 lb

## 2022-05-03 DIAGNOSIS — E1165 Type 2 diabetes mellitus with hyperglycemia: Secondary | ICD-10-CM

## 2022-05-03 DIAGNOSIS — E1159 Type 2 diabetes mellitus with other circulatory complications: Secondary | ICD-10-CM | POA: Diagnosis not present

## 2022-05-03 DIAGNOSIS — E785 Hyperlipidemia, unspecified: Secondary | ICD-10-CM | POA: Diagnosis not present

## 2022-05-03 DIAGNOSIS — E1169 Type 2 diabetes mellitus with other specified complication: Secondary | ICD-10-CM

## 2022-05-03 DIAGNOSIS — I152 Hypertension secondary to endocrine disorders: Secondary | ICD-10-CM

## 2022-05-03 LAB — COMPREHENSIVE METABOLIC PANEL
ALT: 12 U/L (ref 0–35)
AST: 14 U/L (ref 0–37)
Albumin: 4 g/dL (ref 3.5–5.2)
Alkaline Phosphatase: 83 U/L (ref 39–117)
BUN: 13 mg/dL (ref 6–23)
CO2: 28 mEq/L (ref 19–32)
Calcium: 9.1 mg/dL (ref 8.4–10.5)
Chloride: 100 mEq/L (ref 96–112)
Creatinine, Ser: 1.14 mg/dL (ref 0.40–1.20)
GFR: 46.49 mL/min — ABNORMAL LOW (ref >60.00)
Glucose, Bld: 245 mg/dL — ABNORMAL HIGH (ref 70–99)
Potassium: 4 mEq/L (ref 3.5–5.1)
Sodium: 137 mEq/L (ref 135–145)
Total Bilirubin: 0.8 mg/dL (ref 0.2–1.2)
Total Protein: 6.4 g/dL (ref 6.0–8.3)

## 2022-05-03 LAB — LIPID PANEL
Cholesterol: 187 mg/dL (ref 0–200)
HDL: 43.8 mg/dL (ref >39.00)
LDL Cholesterol: 122 mg/dL — ABNORMAL HIGH (ref 0–99)
NonHDL: 143.68
Total CHOL/HDL Ratio: 4
Triglycerides: 106 mg/dL (ref 0.0–149.0)
VLDL: 21.2 mg/dL (ref 0.0–40.0)

## 2022-05-03 LAB — HEMOGLOBIN A1C: Hgb A1c MFr Bld: 9.6 % — ABNORMAL HIGH (ref 4.6–6.5)

## 2022-05-03 MED ORDER — METFORMIN HCL ER 500 MG PO TB24: ORAL_TABLET | ORAL | 1 refills | Status: DC

## 2022-05-03 MED ORDER — ACCU-CHEK GUIDE VI STRP: ORAL_STRIP | 2 refills | Status: DC

## 2022-05-03 MED ORDER — LOSARTAN POTASSIUM 50 MG PO TABS: 50.0000 mg | ORAL_TABLET | Freq: Two times a day (BID) | ORAL | 0 refills | Status: DC

## 2022-05-03 MED ORDER — EMPAGLIFLOZIN 10 MG PO TABS: 10.0000 mg | ORAL_TABLET | Freq: Every day | ORAL | 1 refills | Status: DC

## 2022-05-03 MED ORDER — PRAVASTATIN SODIUM 40 MG PO TABS: ORAL_TABLET | ORAL | 1 refills | Status: DC

## 2022-05-03 MED ORDER — GLIPIZIDE ER 5 MG PO TB24: ORAL_TABLET | ORAL | 1 refills | Status: DC

## 2022-05-03 NOTE — Addendum Note (Signed)
Addended byJeanie Sewer on: 05/03/2022 02:58 PM   Modules accepted: Level of Service

## 2022-05-03 NOTE — Assessment & Plan Note (Signed)
chronic, unstable, last A1C 9.3 taking Metformin 576m ER qd, Glipizide 52mER qd, Farxiga 1026md not checking CBGs regularly rechecking labs today continue to advise pt on importance of adherence to diabetic diet, taking meds daily to avoid kidney & heart damage sending Metformin & Farxiga refills today f/u in 3 mos

## 2022-05-03 NOTE — Assessment & Plan Note (Addendum)
chronic taking Pravastatin 53m qd checking labs today refill sent f/u 6 months

## 2022-05-03 NOTE — Progress Notes (Signed)
Patient ID: Misty Cervantes, female    DOB: 10-25-1945, 77 y.o.   MRN: [id number]  Chief Complaint  Patient presents with   Diabetes    Fasting/ Labs   Hypertension   Hyperlipidemia   HPI: Hyperlipidemia: Patient is currently maintained on the following medication for hyperlipidemia: Pravastatin. Patient denies myalgia or other side effects. Patient reports good compliance with low fat/low cholesterol diet.  Last lipid panel as follows: Lab Results  Component Value Date   CHOL 162 01/22/2021   HDL 42.90 01/22/2021   LDLCALC 96 01/22/2021   TRIG 117.0 01/22/2021   CHOLHDL 4 01/22/2021  Hypertension: Patient is currently maintained on the following medications for blood pressure: Losartan 68m qd Failed meds include: HCTZ -  hyponatremia Patient reports good compliance with blood pressure medications. Patient denies chest pain, headaches, shortness of breath or swelling. T2DM: Pt is currently maintained on the following medications for diabetes: Glipizide Metformin (ran out a week ago), FIranDenies polyuria/polydipsia/polyphagia, reports occ dizziness. Denies hypoglycemia Home glucose readings range: not checking daily  Assessment & Plan:   Problem List Items Addressed This Visit       Cardiovascular and Mediastinum   Hypertension associated with diabetes ([facility] - Primary    chronic taking Losartan 593mqd, BP high today, pt did take med this am - does not check BP regularly at home Increasing dose to 5082mid f/u in 3 mos      Relevant Medications   metFORMIN (GLUCOPHAGE-XR) 500 MG 24 hr tablet   losartan (COZAAR) 50 MG tablet (Start on 05/31/2022)   empagliflozin (JARDIANCE) 10 MG TABS tablet   glipiZIDE (GLUCOTROL XL) 5 MG 24 hr tablet   pravastatin (PRAVACHOL) 40 MG tablet   Other Relevant Orders   Comp Met (CMET)     Endocrine   Hyperlipidemia associated with type 2 diabetes mellitus (HCC)    chronic taking Pravastatin 51m10m checking labs today refill  sent f/u 6 months      Relevant Medications   metFORMIN (GLUCOPHAGE-XR) 500 MG 24 hr tablet   losartan (COZAAR) 50 MG tablet (Start on 05/31/2022)   empagliflozin (JARDIANCE) 10 MG TABS tablet   glipiZIDE (GLUCOTROL XL) 5 MG 24 hr tablet   pravastatin (PRAVACHOL) 40 MG tablet   Other Relevant Orders   Lipid panel   Comp Met (CMET)   Type 2 diabetes mellitus with hyperglycemia, without long-term current use of insulin (HCC)    chronic, unstable, last A1C 9.3 taking Metformin 500mg25mqd, Glipizide 5mg E45md, Farxiga 10mg q37mt checking CBGs regularly rechecking labs today continue to advise pt on importance of adherence to diabetic diet, taking meds daily to avoid kidney & heart damage sending Metformin & Farxiga refills today f/u in 3 mos      Relevant Medications   metFORMIN (GLUCOPHAGE-XR) 500 MG 24 hr tablet   glucose blood (ACCU-CHEK GUIDE) test strip   losartan (COZAAR) 50 MG tablet (Start on 05/31/2022)   empagliflozin (JARDIANCE) 10 MG TABS tablet   glipiZIDE (GLUCOTROL XL) 5 MG 24 hr tablet   pravastatin (PRAVACHOL) 40 MG tablet   Other Relevant Orders   HgB A1c   Comp Met (CMET)    Subjective:    Outpatient Medications Prior to Visit  Medication Sig Dispense Refill   Accu-Chek FastClix Lancets MISC Check blood sugars 1-2 times per day. 100 each 2   acetaminophen (TYLENOL) 500 MG tablet Take 500-1,000 mg by mouth 2 (two) times daily as needed for  headache or mild pain.     ALAVERT 10 MG dissolvable tablet Take 10 mg by mouth daily as needed for allergies.     aspirin EC 81 MG tablet Take 81 mg by mouth daily. Swallow whole.     Blood Glucose Monitoring Suppl (ACCU-CHEK GUIDE) w/Device KIT Use as needed 1 kit 0   Cyanocobalamin (B-12 PO) Take 1 tablet by mouth daily.     fluticasone (FLONASE) 50 MCG/ACT nasal spray SPRAY 1 SPRAY INTO EACH NOSTRIL TWICE A DAY AS NEEDED FOR ALLERGIES OR RHINITIS 48 mL 0   meclizine (ANTIVERT) 25 MG tablet TAKE 1 TABLET BY MOUTH 3  TIMES A DAY AS NEEDED FOR DIZZINESS 60 tablet 1   Multiple Vitamin (MULTIVITAMIN WITH MINERALS) TABS tablet Take 1 tablet by mouth daily with breakfast.     traZODone (DESYREL) 50 MG tablet TAKE 0.5-1 TABLETS (25-50 MG TOTAL) BY MOUTH AT BEDTIME AS NEEDED. FOR SLEEP 90 tablet 1   empagliflozin (JARDIANCE) 10 MG TABS tablet Take 1 tablet (10 mg total) by mouth daily before breakfast. 90 tablet 1   glipiZIDE (GLUCOTROL XL) 5 MG 24 hr tablet TAKE 1 TABLET BY MOUTH EVERY DAY WITH BREAKFAST 90 tablet 1   glucose blood (ACCU-CHEK GUIDE) test strip TEST FOUR TIMES DAILY 100 strip 0   losartan (COZAAR) 50 MG tablet TAKE 1 TABLET BY MOUTH EVERY DAY 90 tablet 1   metFORMIN (GLUCOPHAGE-XR) 500 MG 24 hr tablet TAKE 1 TABLET BY MOUTH EVERY DAY WITH BREAKFAST 90 tablet 1   pravastatin (PRAVACHOL) 40 MG tablet TAKE 1 TABLET BY MOUTH EVERYDAY AT BEDTIME 90 tablet 1   No facility-administered medications prior to visit.   Past Medical History:  Diagnosis Date   Chronic kidney disease (CKD), stage III (moderate) ([place])    followed by pcp   Full dentures    Headache 08/23/2021   History of 2019 novel coronavirus disease (COVID-19) 04/05/2019   result in care everywhere ,  per pt mild symptoms that resolved   History of gastric ulcer 2013   non-bleeding   Hyperlipemia    Hypokalemia    Hyponatremia 08/23/2021   Low serum potassium level 01/29/2021   Mixed stress and urge incontinence    Osteoarthritis    Presence of pessary    Respiratory distress 11/28/2021   Type 2 diabetes mellitus ([place])    followed by pcp   (08-26-2020 per pt checks blood sugar twice weekly, unsure what fasting sugar is)   UTI (urinary tract infection) 08/23/2021   Vaginal vault 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    gyn--- dr Talbert Nan, using pessary   Wears glasses    Past Surgical History:  Procedure Laterality Date   CARPAL TUNNEL RELEASE Right    1980s   CATARACT EXTRACTION W/ INTRAOCULAR LENS IMPLANT Bilateral 2013   approx    COLONOSCOPY WITH ESOPHAGOGASTRODUODENOSCOPY (EGD)  11/17/2011   COLPOCLEISIS N/A 09/02/2020   Procedure: COLPOCLEISIS, PERINEORRHAPHY;  Surgeon: Salvadore Dom, MD;  Location: [facility];  Service: Gynecology;  Laterality: N/A;   CYSTOSCOPY N/A 09/02/2020   Procedure: CYSTOSCOPY;  Surgeon: Salvadore Dom, MD;  Location: [facility];  Service: Gynecology;  Laterality: N/A;   RETINAL DETACHMENT SURGERY Right 2003   TOTAL HIP ARTHROPLASTY Left 03/02/2010   @[facility]$    TOTAL KNEE ARTHROPLASTY Right 11/03/2015   Procedure: RIGHT TOTAL KNEE ARTHROPLASTY;  Surgeon: Gaynelle Arabian, MD;  Location: [facility];  Service: Orthopedics;  Laterality: Right;   TOTAL VAGINAL HYSTERECTOMY  2012  TVH, anterior repair, TVT (no mesh used for the anterior repair)   Allergies  Allergen Reactions   Latex Other (See Comments)    Exact reaction not cited   Norvasc [Amlodipine] Swelling      Objective:    Physical Exam Vitals and nursing note reviewed.  Constitutional:      Appearance: Normal appearance.  Cardiovascular:     Rate and Rhythm: Normal rate and regular rhythm.  Pulmonary:     Effort: Pulmonary effort is normal.     Breath sounds: Normal breath sounds.  Musculoskeletal:        General: Normal range of motion.  Skin:    General: Skin is warm and dry.  Neurological:     Mental Status: She is alert.  Psychiatric:        Mood and Affect: Mood normal.        Behavior: Behavior normal.    BP (!) 151/79   Pulse 93   Temp (!) 97.4 F (36.3 C) (Temporal)   Ht 4' 11"$  (1.499 m)   Wt 155 lb 4 oz (70.4 kg)   LMP  (LMP Unknown)   SpO2 97%   BMI 31.36 kg/m  Wt Readings from Last 3 Encounters:  05/03/22 155 lb 4 oz (70.4 kg)  01/06/22 151 lb (68.5 kg)  12/07/21 151 lb 6.4 oz (68.7 kg)       Jeanie Sewer, NP

## 2022-05-03 NOTE — Assessment & Plan Note (Addendum)
chronic taking Losartan 37m qd, BP high today, pt did take med this am - does not check BP regularly at home Increasing dose to 588mbid f/u in 3 mos

## 2022-05-03 NOTE — Patient Instructions (Addendum)
It was very nice to see you today!   I will review your lab results via MyChart in a few days.  I have sent your refills to your pharmacy, with new testing strips.   Keep checking your blood sugar in am before eating, should be less than 120. Can check in evenings before dinner and this should be less than 150.  Take all your meds as directed! Drink up to 8 cups of water every day!      PLEASE NOTE:  If you had any lab tests please let us know if you have not heard back within a few days. You may see your results on MyChart before we have a chance to review them but we will give you a call once they are reviewed by [place]. If we ordered any referrals today, please let us know if you have not heard from their office within the next week.

## 2022-05-10 ENCOUNTER — Other Ambulatory Visit: Payer: Self-pay | Admitting: Family

## 2022-05-10 DIAGNOSIS — E1165 Type 2 diabetes mellitus with hyperglycemia: Secondary | ICD-10-CM

## 2022-05-10 MED ORDER — GLIPIZIDE ER 5 MG PO TB24: 5.0000 mg | ORAL_TABLET | Freq: Two times a day (BID) | ORAL | 1 refills | Status: DC

## 2022-05-10 NOTE — Progress Notes (Signed)
Your glucose is too high at 245 and your A1C increased to 9.6, which is too high! This is almost at insulin level. You have to get your sugar under control. Increase your Glipizide to twice a day and take with the Losartan that you should have already increase to twice a day.  Your electrolytes,  liver function, and cholesterol numbers are ok except your LDL (bad #) is elevated. Be sure you are taking the Pravastatin every day. Your kidney function is down again and this is due to your high blood sugar and blood pressure. Be sure you are drinking at least 8 cups of water daily & avoid Advil, Aleve, and generics. Tylenol is ok to take. Any questions let me know!

## 2022-05-19 ENCOUNTER — Other Ambulatory Visit: Payer: Self-pay

## 2022-05-19 DIAGNOSIS — E1165 Type 2 diabetes mellitus with hyperglycemia: Secondary | ICD-10-CM

## 2022-05-19 MED ORDER — ACCU-CHEK FASTCLIX LANCETS MISC: 2 refills | Status: DC

## 2022-06-27 ENCOUNTER — Other Ambulatory Visit: Payer: Self-pay | Admitting: Family

## 2022-06-27 DIAGNOSIS — F5101 Primary insomnia: Secondary | ICD-10-CM

## 2022-07-30 ENCOUNTER — Other Ambulatory Visit: Payer: Self-pay | Admitting: Family

## 2022-07-30 DIAGNOSIS — E1159 Type 2 diabetes mellitus with other circulatory complications: Secondary | ICD-10-CM

## 2022-08-19 ENCOUNTER — Ambulatory Visit (INDEPENDENT_AMBULATORY_CARE_PROVIDER_SITE_OTHER): Payer: Medicare Other

## 2022-08-19 VITALS — BP 138/78 | HR 98 | Wt 158.0 lb

## 2022-08-19 DIAGNOSIS — Z0000 Encounter for general adult medical examination without abnormal findings: Secondary | ICD-10-CM | POA: Diagnosis not present

## 2022-08-19 NOTE — Progress Notes (Addendum)
Subjective:   Misty Cervantes is a 77 y.o. female who presents for Medicare Annual (Subsequent) preventive examination.  Review of Systems     Cardiac Risk Factors include: advanced age (>51men, >71 women);diabetes mellitus;hypertension;dyslipidemia;obesity (BMI >30kg/m2)     Objective:    Today's Vitals   08/19/22 0959 08/19/22 1035  BP: (!) 144/100 138/78  Pulse: 98   SpO2: 95%   Weight: 158 lb (71.7 kg)    Body mass index is 31.91 kg/m.     08/19/2022   10:25 AM 11/27/2021    8:15 PM 08/22/2021   10:00 PM 08/22/2021    3:18 PM 07/30/2021    1:54 PM 07/16/2021   12:23 PM 09/02/2020    6:11 AM  Advanced Directives  Does Patient Have a Medical Advance Directive? No No No No No No No  Would patient like information on creating a medical advance directive? Yes (MAU/Ambulatory/Procedural Areas - Information given)  No - Patient declined   Yes (MAU/Ambulatory/Procedural Areas - Information given) No - Patient declined    Current Medications (verified) Outpatient Encounter Medications as of 08/19/2022  Medication Sig   Accu-Chek FastClix Lancets MISC Check blood sugars 1-2 times per day.   acetaminophen (TYLENOL) 500 MG tablet Take 500-1,000 mg by mouth 2 (two) times daily as needed for headache or mild pain.   ALAVERT 10 MG dissolvable tablet Take 10 mg by mouth daily as needed for allergies.   aspirin EC 81 MG tablet Take 81 mg by mouth daily. Swallow whole.   Blood Glucose Monitoring Suppl (ACCU-CHEK GUIDE) w/Device KIT Use as needed   Cyanocobalamin (B-12 PO) Take 1 tablet by mouth daily.   empagliflozin (JARDIANCE) 10 MG TABS tablet Take 1 tablet (10 mg total) by mouth daily before breakfast.   fluticasone (FLONASE) 50 MCG/ACT nasal spray SPRAY 1 SPRAY INTO EACH NOSTRIL TWICE A DAY AS NEEDED FOR ALLERGIES OR RHINITIS   glipiZIDE (GLUCOTROL XL) 5 MG 24 hr tablet Take 1 tablet (5 mg total) by mouth 2 (two) times daily after a meal.   glucose blood (ACCU-CHEK GUIDE) test strip Use  1 strip twice a day.   losartan (COZAAR) 50 MG tablet TAKE 1 TABLET BY MOUTH TWICE A DAY   metFORMIN (GLUCOPHAGE-XR) 500 MG 24 hr tablet TAKE 1 TABLET BY MOUTH EVERY DAY WITH BREAKFAST   Multiple Vitamin (MULTIVITAMIN WITH MINERALS) TABS tablet Take 1 tablet by mouth daily with breakfast.   pravastatin (PRAVACHOL) 40 MG tablet TAKE 1 TABLET BY MOUTH EVERYDAY AT BEDTIME   traZODone (DESYREL) 50 MG tablet TAKE 0.5-1 TABLETS (25-50 MG TOTAL) BY MOUTH AT BEDTIME AS NEEDED. FOR SLEEP   [DISCONTINUED] meclizine (ANTIVERT) 25 MG tablet TAKE 1 TABLET BY MOUTH 3 TIMES A DAY AS NEEDED FOR DIZZINESS   No facility-administered encounter medications on file as of 08/19/2022.    Allergies (verified) Latex and Norvasc [amlodipine]   History: Past Medical History:  Diagnosis Date   Chronic kidney disease (CKD), stage III (moderate) (HCC)    followed by pcp   Full dentures    Headache 08/23/2021   History of 2019 novel coronavirus disease (COVID-19) 04/05/2019   result in care everywhere ,  per pt mild symptoms that resolved   History of gastric ulcer 2013   non-bleeding   Hyperlipemia    Hypokalemia    Hyponatremia 08/23/2021   Low serum potassium level 01/29/2021   Mixed stress and urge incontinence    Osteoarthritis    Presence of pessary  Respiratory distress 11/28/2021   Type 2 diabetes mellitus (HCC)    followed by pcp   (08-26-2020 per pt checks blood sugar twice weekly, unsure what fasting sugar is)   UTI (urinary tract infection) 08/23/2021   Vaginal vault 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    gyn--- dr Oscar La, using pessary   Wears glasses    Past Surgical History:  Procedure Laterality Date   CARPAL TUNNEL RELEASE Right    1980s   CATARACT EXTRACTION W/ INTRAOCULAR LENS IMPLANT Bilateral 2013   approx   COLONOSCOPY WITH ESOPHAGOGASTRODUODENOSCOPY (EGD)  11/17/2011   COLPOCLEISIS N/A 09/02/2020   Procedure: COLPOCLEISIS, PERINEORRHAPHY;  Surgeon: Romualdo Bolk, MD;  Location:  [facility] [place];  Service: Gynecology;  Laterality: N/A;   CYSTOSCOPY N/A 09/02/2020   Procedure: CYSTOSCOPY;  Surgeon: Romualdo Bolk, MD;  Location: [facility];  Service: Gynecology;  Laterality: N/A;   RETINAL DETACHMENT SURGERY Right 2003   TOTAL HIP ARTHROPLASTY Left 03/02/2010   @[facility]    TOTAL KNEE ARTHROPLASTY Right 11/03/2015   Procedure: RIGHT TOTAL KNEE ARTHROPLASTY;  Surgeon: Ollen Gross, MD;  Location: [facility];  Service: Orthopedics;  Laterality: Right;   TOTAL VAGINAL HYSTERECTOMY  2012   TVH, anterior repair, TVT (no mesh used for the anterior repair)   Family History  Problem Relation Age of Onset   Diabetes Mother        deceased   Heart attack Mother    Diabetes Sister        x2   Kidney cancer Father    Heart attack Father    Diabetes Sister    Social History   Socioeconomic History   Marital status: Divorced    Spouse name: Not on file   Number of children: Not on file   Years of education: Not on file   Highest education level: Not on file  Occupational History   Occupation: retired    Associate Professor: RETIRED  Tobacco Use   Smoking status: Never   Smokeless tobacco: Never  Vaping Use   Vaping Use: Never used  Substance and Sexual Activity   Alcohol use: No   Drug use: No   Sexual activity: Not Currently    Partners: Male    Birth control/protection: Surgical    Comment: hysterectomy  Other Topics Concern   Not on file  Social History Narrative   Divorced, 2 children; retired.    Custody of grandchildren    Social Determinants of Health   Financial Resource Strain: Low Risk  (08/19/2022)   Overall Financial Resource Strain (CARDIA)    Difficulty of Paying Living Expenses: Not hard at all  Food Insecurity: No Food Insecurity (08/19/2022)   Hunger Vital Sign    Worried About Running Out of Food in the Last Year: Never true    Ran Out of Food in the Last Year: Never true  Transportation Needs: No Transportation Needs  (08/19/2022)   PRAPARE - Administrator, Civil Service (Medical): No    Lack of Transportation (Non-Medical): No  Physical Activity: Inactive (08/19/2022)   Exercise Vital Sign    Days of Exercise per Week: 0 days    Minutes of Exercise per Session: 0 min  Stress: Stress Concern Present (08/19/2022)   [employer] of Occupational Health - Occupational Stress Questionnaire    Feeling of Stress : To some extent  Social Connections: Socially Isolated (08/19/2022)   Social Connection and Isolation Panel [NHANES]    Frequency of Communication with  Friends and Family: More than three times a week    Frequency of Social Gatherings with Friends and Family: More than three times a week    Attends Religious Services: Never    Database administrator or Organizations: No    Attends Engineer, structural: Never    Marital Status: Divorced    Tobacco Counseling Counseling given: Not Answered   Clinical Intake:  Pre-visit preparation completed: Yes  Pain : No/denies pain     BMI - recorded: 31.91 Nutritional Status: BMI > 30  Obese Nutritional Risks: None Diabetes: Yes CBG done?: Yes (198 per pt) CBG resulted in Enter/ Edit results?: No Did pt. bring in CBG monitor from home?: No  How often do you need to have someone help you when you read instructions, pamphlets, or other written materials from your doctor or pharmacy?: 1 - Never  Diabetic?Nutrition Risk Assessment:  Has the patient had any N/V/D within the last 2 months?  No  Does the patient have any non-healing wounds?  No  Has the patient had any unintentional weight loss or weight gain?  No   Diabetes:  Is the patient diabetic?  Yes  If diabetic, was a CBG obtained today?  Yes  Did the patient bring in their glucometer from home?  Yes  How often do you monitor your CBG's? daily.   Financial Strains and Diabetes Management:  Are you having any financial strains with the device, your supplies or your  medication? No .  Does the patient want to be seen by Chronic Care Management for management of their diabetes?  No  Would the patient like to be referred to a Nutritionist or for Diabetic Management?  No   Diabetic Exams:  Diabetic Eye Exam: Overdue for diabetic eye exam. Pt has been advised about the importance in completing this exam. Patient advised to call and schedule an eye exam. Diabetic Foot Exam: Completed 01/06/22   Interpreter Needed?: No  Information entered by :: Lanier Ensign, LPN   Activities of Daily Living    08/19/2022   10:28 AM 08/22/2021   10:00 PM  In your present state of health, do you have any difficulty performing the following activities:  Hearing? 0 0  Vision? 0 0  Difficulty concentrating or making decisions? 0 0  Walking or climbing stairs? 0 1  Dressing or bathing? 0 0  Doing errands, shopping? 0 0  Preparing Food and eating ? N   Using the Toilet? N   In the past six months, have you accidently leaked urine? N   Do you have problems with loss of bowel control? N   Managing your Medications? N   Managing your Finances? N   Housekeeping or managing your Housekeeping? N     Patient Care Team: Dulce Sellar, NP as PCP - General (Family Medicine) Ollen Gross, MD as Consulting Physician (Orthopedic Surgery) Romualdo Bolk, MD as Consulting Physician (Obstetrics and Gynecology) Nelson Chimes, MD as Consulting Physician (Ophthalmology) Malvin Johns as Physician Assistant (Physician Assistant) Romualdo Bolk, MD as Consulting Physician (Obstetrics and Gynecology)  Indicate any recent Medical Services you may have received from other than [facility] providers in the past year (date may be approximate).     Assessment:   This is a routine wellness examination for [place].  Hearing/Vision screen Hearing Screening - Comments:: Pt denies any hearing issues  Vision Screening - Comments:: Pt encouraged to follow up with provider    Dietary issues  and exercise activities discussed: Current Exercise Habits: The patient does not participate in regular exercise at present   Goals Addressed             This Visit's Progress    Patient Stated       To get off some of the medicines        Depression Screen    08/19/2022   10:20 AM 05/03/2022   11:16 AM 07/16/2021   12:21 PM 01/22/2021    9:13 AM 07/11/2020   11:49 AM 02/13/2020   10:36 AM 06/22/2019    1:48 PM  PHQ 2/9 Scores  PHQ - 2 Score 2 4 2  0 0 0 0  PHQ- 9 Score 5 8 4    3     Fall Risk    08/19/2022   10:26 AM 05/03/2022   11:14 AM 07/16/2021   12:25 PM 07/11/2020   11:56 AM 02/13/2020   10:45 AM  Fall Risk   Falls in the past year? 0 0 0 0 0  Number falls in past yr: 0 0 0 0   Injury with Fall? 0 0 0 0   Risk for fall due to : Impaired vision No Fall Risks Impaired vision Impaired balance/gait;Impaired vision   Follow up Falls prevention discussed Falls evaluation completed;Education provided Falls prevention discussed Falls prevention discussed     FALL RISK PREVENTION PERTAINING TO THE HOME:  Any stairs in or around the home? No  If so, are there any without handrails? No  Home free of loose throw rugs in walkways, pet beds, electrical cords, etc? Yes  Adequate lighting in your home to reduce risk of falls? Yes   ASSISTIVE DEVICES UTILIZED TO PREVENT FALLS:  Life alert? No  Use of a cane, walker or w/c? No  Grab bars in the bathroom? No  Shower chair or bench in shower? No  Elevated toilet seat or a handicapped toilet? No   TIMED UP AND GO:  Was the test performed? Yes .  Length of time to ambulate 10 feet: 10 sec.   Gait steady and fast without use of assistive device  Cognitive Function:        08/19/2022   10:29 AM 07/16/2021   12:28 PM 07/11/2020   12:01 PM 05/24/2019    1:55 PM  6CIT Screen  What Year? 0 points 0 points 0 points 0 points  What month? 0 points 0 points 0 points 0 points  What time? 0 points 0 points  0 points   Count back from 20 0 points 0 points 0 points 0 points  Months in reverse 0 points 0 points 0 points 0 points  Repeat phrase 0 points 0 points 0 points 0 points  Total Score 0 points 0 points  0 points    Immunizations Immunization History  Administered Date(s) Administered   Fluad Quad(high Dose 65+) 11/19/2018   Influenza, High Dose Seasonal PF 04/11/2018   Influenza,inj,Quad PF,6+ Mos 01/28/2015   PFIZER(Purple Top)SARS-COV-2 Vaccination 08/11/2019, 09/02/2019, 05/28/2020   Pneumococcal Polysaccharide-23 01/01/2011, 04/11/2018   Zoster Recombinat (Shingrix) 03/26/2018, 11/19/2018    TDAP status: Due, Education has been provided regarding the importance of this vaccine. Advised may receive this vaccine at local pharmacy or Health Dept. Aware to provide a copy of the vaccination record if obtained from local pharmacy or Health Dept. Verbalized acceptance and understanding.  Flu Vaccine status: Due, Education has been provided regarding the importance of this vaccine. Advised may receive this vaccine  at local pharmacy or Health Dept. Aware to provide a copy of the vaccination record if obtained from local pharmacy or Health Dept. Verbalized acceptance and understanding.  Pneumococcal vaccine status: Up to date  Covid-19 vaccine status: Completed vaccines  Qualifies for Shingles Vaccine? Yes   Zostavax completed Yes   Shingrix Completed?: Yes  Screening Tests Health Maintenance  Topic Date Due   DTaP/Tdap/Td (1 - Tdap) Never done   OPHTHALMOLOGY EXAM  08/06/2017   Diabetic kidney evaluation - Urine ACR  07/15/2021   INFLUENZA VACCINE  10/14/2022   HEMOGLOBIN A1C  11/01/2022   FOOT EXAM  01/07/2023   Diabetic kidney evaluation - eGFR measurement  05/04/2023   Medicare Annual Wellness (AWV)  08/19/2023   DEXA SCAN  Completed   Zoster Vaccines- Shingrix  Completed   HPV VACCINES  Aged Out   Pneumonia Vaccine 42+ Years old  Discontinued   Colonoscopy  Discontinued   COVID-19  Vaccine  Discontinued   Hepatitis C Screening  Discontinued    Health Maintenance  Health Maintenance Due  Topic Date Due   DTaP/Tdap/Td (1 - Tdap) Never done   OPHTHALMOLOGY EXAM  08/06/2017   Diabetic kidney evaluation - Urine ACR  07/15/2021    Colorectal cancer screening: No longer required.   Mammogram status: Completed 09/16/20. Repeat every year  Bone Density status: Completed 08/26/16. Results reflect: Bone density results: NORMAL. Repeat every 2 years.    Additional Screening:  Hepatitis C Screening: does not qualify  Vision Screening: Recommended annual ophthalmology exams for early detection of glaucoma and other disorders of the eye. Is the patient up to date with their annual eye exam?  No  Who is the provider or what is the name of the office in which the patient attends annual eye exams? Encourage to follow up  If pt is not established with a provider, would they like to be referred to a provider to establish care? No .   Dental Screening: Recommended annual dental exams for proper oral hygiene  Community Resource Referral / Chronic Care Management: CRR required this visit?  No   CCM required this visit?  No      Plan:     I have personally reviewed and noted the following in the patient's chart:   Medical and social history Use of alcohol, tobacco or illicit drugs  Current medications and supplements including opioid prescriptions. Patient is not currently taking opioid prescriptions. Functional ability and status Nutritional status Physical activity Advanced directives List of other physicians Hospitalizations, surgeries, and ER visits in previous 12 months Vitals Screenings to include cognitive, depression, and falls Referrals and appointments  In addition, I have reviewed and discussed with patient certain preventive protocols, quality metrics, and best practice recommendations. A written personalized care plan for preventive services as well  as general preventive health recommendations were provided to patient.     Marzella Schlein, LPN   03/20/1094   Nurse Notes: none

## 2022-08-19 NOTE — Patient Instructions (Signed)
[name] , Thank you for taking time to come for your Medicare Wellness Visit. I appreciate your ongoing commitment to your health goals. Please review the following plan we discussed and let me know if I can assist you in the future.   These are the goals we discussed:  Goals      DIET - INCREASE WATER INTAKE     Try to drink water at meals; Keep a cooler in the car  Will exercise when she eats or bs is high  [facility] when the [facility] are in school      Increase exercise to 3 days per week for one hour     patient     Try to learn how to unwind...  Can try to get back [facility] and meet back up with friends      Patient Stated     Stay healthy     Patient Stated     Lose weight         This is a list of the screening recommended for you and due dates:  Health Maintenance  Topic Date Due   DTaP/Tdap/Td vaccine (1 - Tdap) Never done   Eye exam for diabetics  08/06/2017   Yearly kidney health urinalysis for diabetes  07/15/2021   Medicare Annual Wellness Visit  07/17/2022   Flu Shot  10/14/2022   Hemoglobin A1C  11/01/2022   Complete foot exam   01/07/2023   Yearly kidney function blood test for diabetes  05/04/2023   DEXA scan (bone density measurement)  Completed   Zoster (Shingles) Vaccine  Completed   HPV Vaccine  Aged Out   Pneumonia Vaccine  Discontinued   Colon Cancer Screening  Discontinued   COVID-19 Vaccine  Discontinued   Hepatitis C Screening  Discontinued    Advanced directives: Advance directive discussed with you today. I have provided a copy for you to complete at home and have notarized. Once this is complete please bring a copy in to our office so we can scan it into your chart.   Conditions/risks identified: get off some of these medications   Next appointment: Follow up in one year for your annual wellness visit    Preventive Care 65 Years and Older, Female Preventive care refers to lifestyle choices and visits with your health care  provider that can promote health and wellness. What does preventive care include? A yearly physical exam. This is also called an annual well check. Dental exams once or twice a year. Routine eye exams. Ask your health care provider how often you should have your eyes checked. Personal lifestyle choices, including: Daily care of your teeth and gums. Regular physical activity. Eating a healthy diet. Avoiding tobacco and drug use. Limiting alcohol use. Practicing safe sex. Taking low-dose aspirin every day. Taking vitamin and mineral supplements as recommended by your health care provider. What happens during an annual well check? The services and screenings done by your health care provider during your annual well check will depend on your age, overall health, lifestyle risk factors, and family history of disease. Counseling  Your health care provider may ask you questions about your: Alcohol use. Tobacco use. Drug use. Emotional well-being. Home and relationship well-being. Sexual activity. Eating habits. History of falls. Memory and ability to understand (cognition). Work and work Astronomer. Reproductive health. Screening  You may have the following tests or measurements: Height, weight, and BMI. Blood pressure. Lipid and cholesterol levels. These  may be checked every 5 years, or more frequently if you are over 78 years old. Skin check. Lung cancer screening. You may have this screening every year starting at age 82 if you have a 30-pack-year history of smoking and currently smoke or have quit within the past 15 years. Fecal occult blood test (FOBT) of the stool. You may have this test every year starting at age 1. Flexible sigmoidoscopy or colonoscopy. You may have a sigmoidoscopy every 5 years or a colonoscopy every 10 years starting at age 59. Hepatitis C blood test. Hepatitis B blood test. Sexually transmitted disease (STD) testing. Diabetes screening. This is done by  checking your blood sugar (glucose) after you have not eaten for a while (fasting). You may have this done every 1-3 years. Bone density scan. This is done to screen for osteoporosis. You may have this done starting at age 36. Mammogram. This may be done every 1-2 years. Talk to your health care provider about how often you should have regular mammograms. Talk with your health care provider about your test results, treatment options, and if necessary, the need for more tests. Vaccines  Your health care provider may recommend certain vaccines, such as: Influenza vaccine. This is recommended every year. Tetanus, diphtheria, and acellular pertussis (Tdap, Td) vaccine. You may need a Td booster every 10 years. Zoster vaccine. You may need this after age 68. Pneumococcal 13-valent conjugate (PCV13) vaccine. One dose is recommended after age 4. Pneumococcal polysaccharide (PPSV23) vaccine. One dose is recommended after age 20. Talk to your health care provider about which screenings and vaccines you need and how often you need them. This information is not intended to replace advice given to you by your health care provider. Make sure you discuss any questions you have with your health care provider. Document Released: 03/28/2015 Document Revised: 11/19/2015 Document Reviewed: 12/31/2014 Elsevier Interactive Patient Education  2017 [employer].  Fall Prevention in the Home Falls can cause injuries. They can happen to people of all ages. There are many things you can do to make your home safe and to help prevent falls. What can I do on the outside of my home? Regularly fix the edges of walkways and driveways and fix any cracks. Remove anything that might make you trip as you walk through a door, such as a raised step or threshold. Trim any bushes or trees on the path to your home. Use bright outdoor lighting. Clear any walking paths of anything that might make someone trip, such as rocks or  tools. Regularly check to see if handrails are loose or broken. Make sure that both sides of any steps have handrails. Any raised decks and porches should have guardrails on the edges. Have any leaves, snow, or ice cleared regularly. Use sand or salt on walking paths during winter. Clean up any spills in your garage right away. This includes oil or grease spills. What can I do in the bathroom? Use night lights. Install grab bars by the toilet and in the tub and shower. Do not use towel bars as grab bars. Use non-skid mats or decals in the tub or shower. If you need to sit down in the shower, use a plastic, non-slip stool. Keep the floor dry. Clean up any water that spills on the floor as soon as it happens. Remove soap buildup in the tub or shower regularly. Attach bath mats securely with double-sided non-slip rug tape. Do not have throw rugs and other things on the  floor that can make you trip. What can I do in the bedroom? Use night lights. Make sure that you have a light by your bed that is easy to reach. Do not use any sheets or blankets that are too big for your bed. They should not hang down onto the floor. Have a firm chair that has side arms. You can use this for support while you get dressed. Do not have throw rugs and other things on the floor that can make you trip. What can I do in the kitchen? Clean up any spills right away. Avoid walking on wet floors. Keep items that you use a lot in easy-to-reach places. If you need to reach something above you, use a strong step stool that has a grab bar. Keep electrical cords out of the way. Do not use floor polish or wax that makes floors slippery. If you must use wax, use non-skid floor wax. Do not have throw rugs and other things on the floor that can make you trip. What can I do with my stairs? Do not leave any items on the stairs. Make sure that there are handrails on both sides of the stairs and use them. Fix handrails that are  broken or loose. Make sure that handrails are as long as the stairways. Check any carpeting to make sure that it is firmly attached to the stairs. Fix any carpet that is loose or worn. Avoid having throw rugs at the top or bottom of the stairs. If you do have throw rugs, attach them to the floor with carpet tape. Make sure that you have a light switch at the top of the stairs and the bottom of the stairs. If you do not have them, ask someone to add them for you. What else can I do to help prevent falls? Wear shoes that: Do not have high heels. Have rubber bottoms. Are comfortable and fit you well. Are closed at the toe. Do not wear sandals. If you use a stepladder: Make sure that it is fully opened. Do not climb a closed stepladder. Make sure that both sides of the stepladder are locked into place. Ask someone to hold it for you, if possible. Clearly mark and make sure that you can see: Any grab bars or handrails. First and last steps. Where the edge of each step is. Use tools that help you move around (mobility aids) if they are needed. These include: Canes. Walkers. Scooters. Crutches. Turn on the lights when you go into a dark area. Replace any light bulbs as soon as they burn out. Set up your furniture so you have a clear path. Avoid moving your furniture around. If any of your floors are uneven, fix them. If there are any pets around you, be aware of where they are. Review your medicines with your doctor. Some medicines can make you feel dizzy. This can increase your chance of falling. Ask your doctor what other things that you can do to help prevent falls. This information is not intended to replace advice given to you by your health care provider. Make sure you discuss any questions you have with your health care provider. Document Released: 12/26/2008 Document Revised: 08/07/2015 Document Reviewed: 04/05/2014 Elsevier Interactive Patient Education  2017 [employer].

## 2022-08-25 ENCOUNTER — Other Ambulatory Visit: Payer: Self-pay | Admitting: Family

## 2022-08-25 DIAGNOSIS — Z1231 Encounter for screening mammogram for malignant neoplasm of breast: Secondary | ICD-10-CM

## 2022-08-26 ENCOUNTER — Encounter: Payer: Self-pay | Admitting: Family

## 2022-08-26 ENCOUNTER — Ambulatory Visit (INDEPENDENT_AMBULATORY_CARE_PROVIDER_SITE_OTHER): Payer: Medicare Other | Admitting: Family

## 2022-08-26 VITALS — BP 139/82 | HR 82 | Ht 59.0 in | Wt 157.1 lb

## 2022-08-26 DIAGNOSIS — I152 Hypertension secondary to endocrine disorders: Secondary | ICD-10-CM

## 2022-08-26 DIAGNOSIS — Z7984 Long term (current) use of oral hypoglycemic drugs: Secondary | ICD-10-CM

## 2022-08-26 DIAGNOSIS — E1159 Type 2 diabetes mellitus with other circulatory complications: Secondary | ICD-10-CM

## 2022-08-26 DIAGNOSIS — N1832 Chronic kidney disease, stage 3b: Secondary | ICD-10-CM

## 2022-08-26 DIAGNOSIS — E1165 Type 2 diabetes mellitus with hyperglycemia: Secondary | ICD-10-CM | POA: Diagnosis not present

## 2022-08-26 LAB — COMPREHENSIVE METABOLIC PANEL
ALT: 19 U/L (ref 0–35)
AST: 18 U/L (ref 0–37)
Albumin: 4.3 g/dL (ref 3.5–5.2)
Alkaline Phosphatase: 69 U/L (ref 39–117)
BUN: 16 mg/dL (ref 6–23)
CO2: 28 mEq/L (ref 19–32)
Calcium: 9.1 mg/dL (ref 8.4–10.5)
Chloride: 101 mEq/L (ref 96–112)
Creatinine, Ser: 1.21 mg/dL — ABNORMAL HIGH (ref 0.40–1.20)
GFR: 43.19 mL/min — ABNORMAL LOW (ref >60.00)
Glucose, Bld: 194 mg/dL — ABNORMAL HIGH (ref 70–99)
Potassium: 4.1 mEq/L (ref 3.5–5.1)
Sodium: 137 mEq/L (ref 135–145)
Total Bilirubin: 1 mg/dL (ref 0.2–1.2)
Total Protein: 6.7 g/dL (ref 6.0–8.3)

## 2022-08-26 LAB — HEMOGLOBIN A1C: Hgb A1c MFr Bld: 8.2 % — ABNORMAL HIGH (ref 4.6–6.5)

## 2022-08-26 LAB — MICROALBUMIN / CREATININE URINE RATIO
Creatinine,U: 52.3 mg/dL
Microalb Creat Ratio: 1.4 mg/g (ref 0.0–30.0)
Microalb, Ur: 0.7 mg/dL (ref 0.0–1.9)

## 2022-08-26 MED ORDER — LOSARTAN POTASSIUM 50 MG PO TABS: 50.0000 mg | ORAL_TABLET | Freq: Two times a day (BID) | ORAL | 0 refills | Status: DC

## 2022-08-26 NOTE — Assessment & Plan Note (Addendum)
chronic taking Losartan 50mg  bid, BP better today, pt did take med this am - does not check BP regularly at home  refill sent f/u in 3 mos

## 2022-08-26 NOTE — Assessment & Plan Note (Addendum)
chronic, unstable, last A1C 9.3 taking Metformin 500mg  ER qd, Glipizide 5mg  ER bid, Jardiance 10mg  qd fasting CBG today was 132 rechecking labs today continue to advise pt on importance of adherence to diabetic diet, taking meds daily to avoid kidney & heart damage no refills needed today, samples of Jardiance & Synjardy given today f/u in 3 mos

## 2022-08-26 NOTE — Assessment & Plan Note (Signed)
last GFR 46, previously 58, in mid 40s mostly  continue to advise on increased water intake & avoiding NSAIDs, & getting her CBGs under control - last A1C 9.6 pt trying to drink more water, doing better with her diet rechecking labs today pt requesting referral to nephrology f/u 3 mos

## 2022-08-26 NOTE — Progress Notes (Signed)
Patient ID: Misty Cervantes, female    DOB: Aug 02, 1945, 77 y.o.   MRN: [id number]  Chief Complaint  Patient presents with   Diabetes   Hypertension    HPI: Hypertension/CKD stage 3: Patient is currently maintained on the following medications for blood pressure: Losartan 50mg  qd, Last GFR 46, previously 58 11/2021. Metformin reduced last year. Failed meds include: HCTZ -  hyponatremia Patient reports good compliance with blood pressure medications. Patient denies chest pain, headaches, shortness of breath or swelling. T2DM: Pt is currently maintained on the following medications for diabetes: Glipizide 5mg  ER bid Metformin 500mg  ER qd, Jardiance 10mg  qd Denies polyuria/polydipsia/polyphagia, reports occ dizziness. Denies hypoglycemia Home glucose readings range: not checking daily, today 132 Last A1C was  Lab Results  Component Value Date   HGBA1C 8.2 (H) 08/26/2022   HGBA1C 9.6 (H) 05/03/2022   HGBA1C 9.3 (H) 12/01/2021     Assessment & Plan:  Type 2 diabetes mellitus with hyperglycemia, without long-term current use of insulin (HCC) Assessment & Plan: chronic, unstable, last A1C 9.3 taking Metformin 500mg  ER qd, Glipizide 5mg  ER bid, Jardiance 10mg  qd fasting CBG today was 132 rechecking labs today continue to advise pt on importance of adherence to diabetic diet, taking meds daily to avoid kidney & heart damage no refills needed today, samples of Jardiance & Synjardy given today f/u in 3 mos  Orders: -     Microalbumin / creatinine urine ratio -     Hemoglobin A1c -     Comprehensive metabolic panel  Hypertension associated with diabetes (HCC) Assessment & Plan: chronic taking Losartan 50mg  bid, BP better today, pt did take med this am - does not check BP regularly at home  refill sent f/u in 3 mos  Orders: -     Losartan Potassium; Take 1 tablet (50 mg total) by mouth 2 (two) times daily.  Dispense: 180 tablet; Refill: 0  Stage 3b chronic kidney disease  (HCC) Assessment & Plan: last GFR 46, previously 58, in mid 40s mostly  continue to advise on increased water intake & avoiding NSAIDs, & getting her CBGs under control - last A1C 9.6 pt trying to drink more water, doing better with her diet rechecking labs today pt requesting referral to nephrology f/u 3 mos  Orders: -     Ambulatory referral to Nephrology    Subjective:    Outpatient Medications Prior to Visit  Medication Sig Dispense Refill   Accu-Chek FastClix Lancets MISC Check blood sugars 1-2 times per day. 100 each 2   acetaminophen (TYLENOL) 500 MG tablet Take 500-1,000 mg by mouth 2 (two) times daily as needed for headache or mild pain.     ALAVERT 10 MG dissolvable tablet Take 10 mg by mouth daily as needed for allergies.     aspirin EC 81 MG tablet Take 81 mg by mouth daily. Swallow whole.     Blood Glucose Monitoring Suppl (ACCU-CHEK GUIDE) w/Device KIT Use as needed 1 kit 0   Cyanocobalamin (B-12 PO) Take 1 tablet by mouth daily.     empagliflozin (JARDIANCE) 10 MG TABS tablet Take 1 tablet (10 mg total) by mouth daily before breakfast. 90 tablet 1   fluticasone (FLONASE) 50 MCG/ACT nasal spray SPRAY 1 SPRAY INTO EACH NOSTRIL TWICE A DAY AS NEEDED FOR ALLERGIES OR RHINITIS 48 mL 0   glipiZIDE (GLUCOTROL XL) 5 MG 24 hr tablet Take 1 tablet (5 mg total) by mouth 2 (two) times daily after a meal. 180  tablet 1   glucose blood (ACCU-CHEK GUIDE) test strip Use 1 strip twice a day. 100 strip 2   metFORMIN (GLUCOPHAGE-XR) 500 MG 24 hr tablet TAKE 1 TABLET BY MOUTH EVERY DAY WITH BREAKFAST 90 tablet 1   Multiple Vitamin (MULTIVITAMIN WITH MINERALS) TABS tablet Take 1 tablet by mouth daily with breakfast.     pravastatin (PRAVACHOL) 40 MG tablet TAKE 1 TABLET BY MOUTH EVERYDAY AT BEDTIME 90 tablet 1   traZODone (DESYREL) 50 MG tablet TAKE 0.5-1 TABLETS (25-50 MG TOTAL) BY MOUTH AT BEDTIME AS NEEDED. FOR SLEEP 90 tablet 1   losartan (COZAAR) 50 MG tablet TAKE 1 TABLET BY MOUTH  TWICE A DAY 180 tablet 0   No facility-administered medications prior to visit.   Past Medical History:  Diagnosis Date   Abdominal pain 08/23/2021   Chronic kidney disease (CKD), stage III (moderate) (HCC)    followed by pcp   Dysphagia 01/22/2021   Full dentures    HCAP (healthcare-associated pneumonia) 08/22/2021   Headache 08/23/2021   History of 2019 novel coronavirus disease (COVID-19) 04/05/2019   result in care everywhere ,  per pt mild symptoms that resolved   History of gastric ulcer 2013   non-bleeding   Hyperlipemia    Hypokalemia    Hyponatremia 08/23/2021   Low serum potassium level 01/29/2021   Mixed stress and urge incontinence    Osteoarthritis    Presence of pessary    Respiratory distress 11/28/2021   Sepsis (HCC) 11/28/2021   Type 2 diabetes mellitus (HCC)    followed by pcp   (08-26-2020 per pt checks blood sugar twice weekly, unsure what fasting sugar is)   UTI (urinary tract infection) 08/23/2021   Vaginal vault 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    gyn--- dr Oscar La, using pessary   Wears glasses    Past Surgical History:  Procedure Laterality Date   CARPAL TUNNEL RELEASE Right    1980s   CATARACT EXTRACTION W/ INTRAOCULAR LENS IMPLANT Bilateral 2013   approx   COLONOSCOPY WITH ESOPHAGOGASTRODUODENOSCOPY (EGD)  11/17/2011   COLPOCLEISIS N/A 09/02/2020   Procedure: COLPOCLEISIS, PERINEORRHAPHY;  Surgeon: Romualdo Bolk, MD;  Location: [facility];  Service: Gynecology;  Laterality: N/A;   CYSTOSCOPY N/A 09/02/2020   Procedure: CYSTOSCOPY;  Surgeon: Romualdo Bolk, MD;  Location: [facility];  Service: Gynecology;  Laterality: N/A;   RETINAL DETACHMENT SURGERY Right 2003   TOTAL HIP ARTHROPLASTY Left 03/02/2010   @[facility]    TOTAL KNEE ARTHROPLASTY Right 11/03/2015   Procedure: RIGHT TOTAL KNEE ARTHROPLASTY;  Surgeon: Ollen Gross, MD;  Location: [facility];  Service: Orthopedics;  Laterality: Right;   TOTAL VAGINAL  HYSTERECTOMY  2012   TVH, anterior repair, TVT (no mesh used for the anterior repair)   Allergies  Allergen Reactions   Latex Other (See Comments)    Exact reaction not cited   Norvasc [Amlodipine] Swelling      Objective:    Physical Exam Vitals and nursing note reviewed.  Constitutional:      Appearance: Normal appearance.  Cardiovascular:     Rate and Rhythm: Normal rate and regular rhythm.  Pulmonary:     Effort: Pulmonary effort is normal.     Breath sounds: Normal breath sounds.  Musculoskeletal:        General: Normal range of motion.  Skin:    General: Skin is warm and dry.  Neurological:     Mental Status: She is alert.  Psychiatric:  Mood and Affect: Mood normal.        Behavior: Behavior normal.    BP 139/82   Pulse 82   Ht 4\' 11"  (1.499 m)   Wt 157 lb 2 oz (71.3 kg)   LMP  (LMP Unknown)   SpO2 94%   BMI 31.74 kg/m  Wt Readings from Last 3 Encounters:  08/26/22 157 lb 2 oz (71.3 kg)  08/19/22 158 lb (71.7 kg)  05/03/22 155 lb 4 oz (70.4 kg)      Dulce Sellar, NP

## 2022-09-21 ENCOUNTER — Ambulatory Visit: Payer: Medicare Other

## 2022-09-23 ENCOUNTER — Ambulatory Visit
Admission: RE | Admit: 2022-09-23 | Discharge: 2022-09-23 | Disposition: A | Discharge status: Home / Self Care | Payer: Medicare Other | Source: Ambulatory Visit | Attending: Family | Admitting: Family

## 2022-09-23 DIAGNOSIS — Z1231 Encounter for screening mammogram for malignant neoplasm of breast: Secondary | ICD-10-CM

## 2022-09-28 ENCOUNTER — Other Ambulatory Visit: Payer: Self-pay | Admitting: Family

## 2022-09-28 DIAGNOSIS — J069 Acute upper respiratory infection, unspecified: Secondary | ICD-10-CM

## 2022-10-04 ENCOUNTER — Other Ambulatory Visit: Payer: Self-pay | Admitting: Family

## 2022-10-04 DIAGNOSIS — E1165 Type 2 diabetes mellitus with hyperglycemia: Secondary | ICD-10-CM

## 2022-10-10 ENCOUNTER — Other Ambulatory Visit: Payer: Self-pay | Admitting: Family

## 2022-10-10 DIAGNOSIS — E1169 Type 2 diabetes mellitus with other specified complication: Secondary | ICD-10-CM

## 2022-10-26 ENCOUNTER — Other Ambulatory Visit: Payer: Self-pay | Admitting: Family

## 2022-10-26 DIAGNOSIS — E1159 Type 2 diabetes mellitus with other circulatory complications: Secondary | ICD-10-CM

## 2022-11-02 ENCOUNTER — Other Ambulatory Visit: Payer: Self-pay | Admitting: Family

## 2022-11-02 DIAGNOSIS — E1165 Type 2 diabetes mellitus with hyperglycemia: Secondary | ICD-10-CM

## 2022-11-12 ENCOUNTER — Other Ambulatory Visit (HOSPITAL_BASED_OUTPATIENT_CLINIC_OR_DEPARTMENT_OTHER): Payer: Self-pay

## 2022-11-13 ENCOUNTER — Other Ambulatory Visit: Payer: Self-pay | Admitting: Family

## 2022-11-13 DIAGNOSIS — E1165 Type 2 diabetes mellitus with hyperglycemia: Secondary | ICD-10-CM

## 2022-12-22 LAB — LAB REPORT - SCANNED
Creatinine, POC: 76.2 mg/dL
EGFR: 41

## 2022-12-23 ENCOUNTER — Encounter: Payer: Self-pay | Admitting: Nephrology

## 2022-12-25 ENCOUNTER — Other Ambulatory Visit: Payer: Self-pay | Admitting: Family

## 2022-12-25 DIAGNOSIS — F5101 Primary insomnia: Secondary | ICD-10-CM

## 2022-12-26 ENCOUNTER — Other Ambulatory Visit: Payer: Self-pay | Admitting: Family

## 2022-12-26 DIAGNOSIS — J069 Acute upper respiratory infection, unspecified: Secondary | ICD-10-CM

## 2023-03-20 ENCOUNTER — Other Ambulatory Visit: Payer: Self-pay | Admitting: Family

## 2023-03-20 DIAGNOSIS — E1165 Type 2 diabetes mellitus with hyperglycemia: Secondary | ICD-10-CM

## 2023-03-25 ENCOUNTER — Other Ambulatory Visit: Payer: Self-pay | Admitting: Family

## 2023-03-25 DIAGNOSIS — J069 Acute upper respiratory infection, unspecified: Secondary | ICD-10-CM

## 2023-04-16 ENCOUNTER — Other Ambulatory Visit: Payer: Self-pay | Admitting: Family

## 2023-04-16 DIAGNOSIS — E1169 Type 2 diabetes mellitus with other specified complication: Secondary | ICD-10-CM

## 2023-05-29 ENCOUNTER — Other Ambulatory Visit: Payer: Self-pay | Admitting: Family

## 2023-05-29 DIAGNOSIS — I152 Hypertension secondary to endocrine disorders: Secondary | ICD-10-CM

## 2023-06-07 IMAGING — US US ABDOMEN LIMITED
1 series · 14 of 25 positions shown · non-contrast
Comparison: None Available.

CLINICAL DATA: Back pain which radiates to RIGHT side.

EXAM:
ULTRASOUND ABDOMEN LIMITED RIGHT UPPER QUADRANT

[Series 1: us abdomen limited ruq (liver/gb) · 14 of 39 slices shown]
[im 1/39]
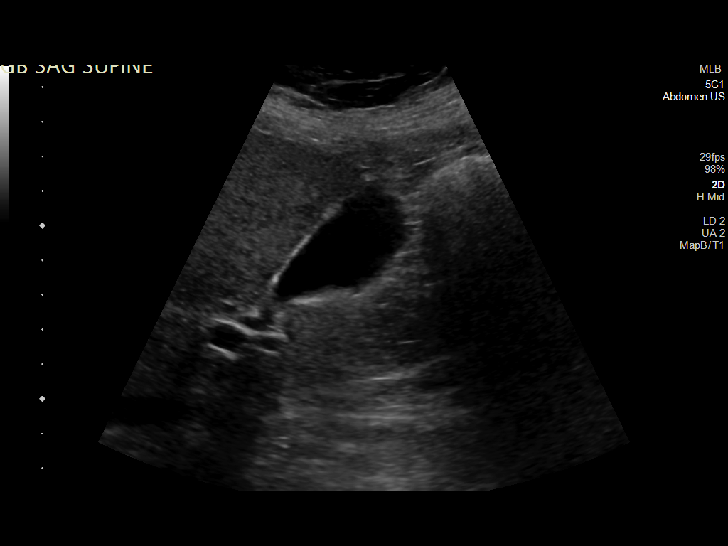
[im 4/39]
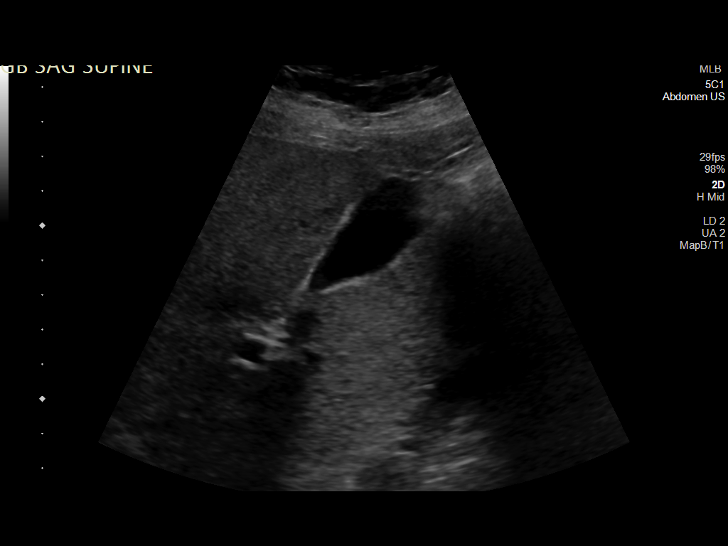
[im 7/39]
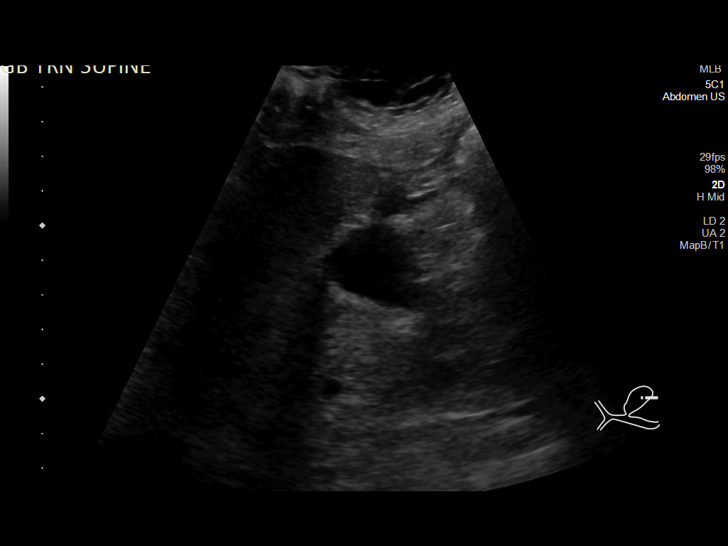
[im 10/39]
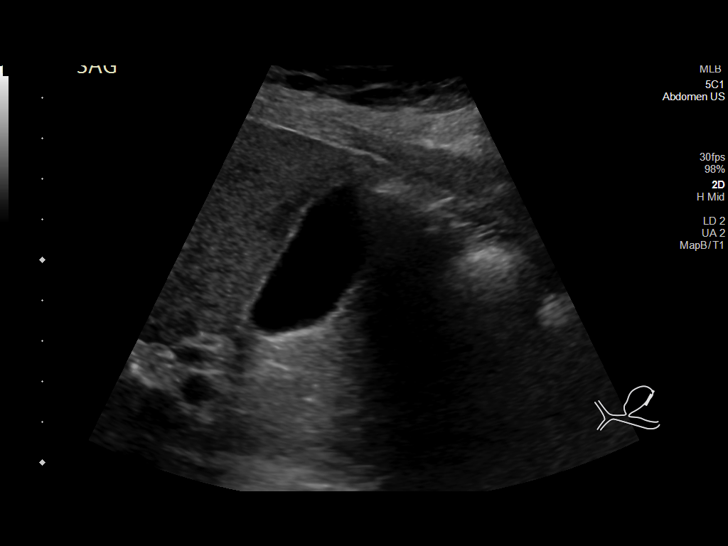
[im 13/39]
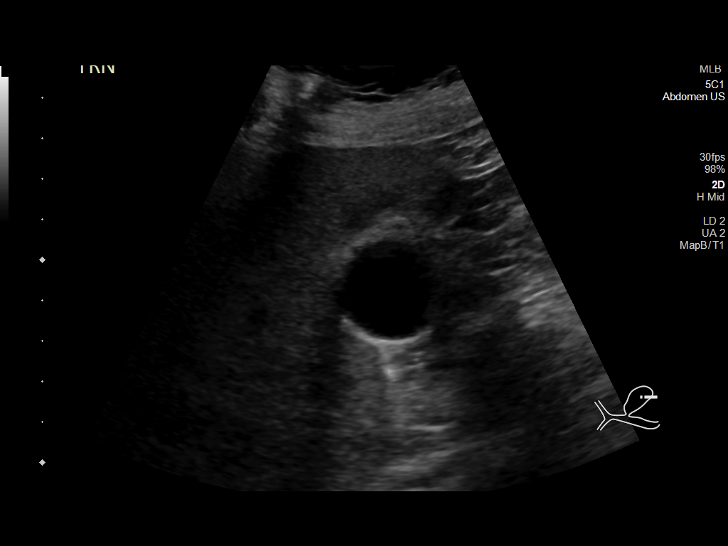
[im 15/39]
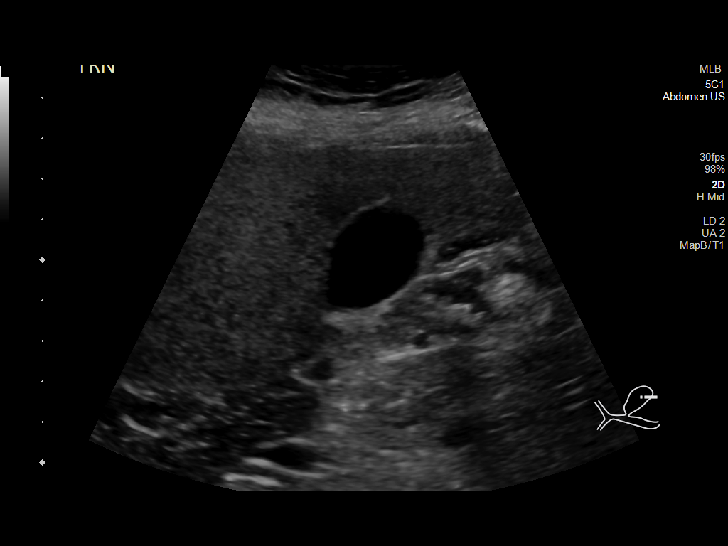
[im 18/39]
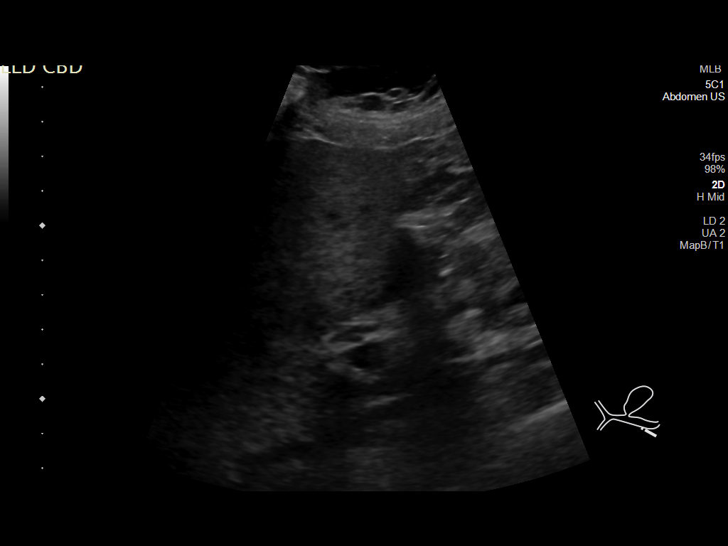
[im 21/39]
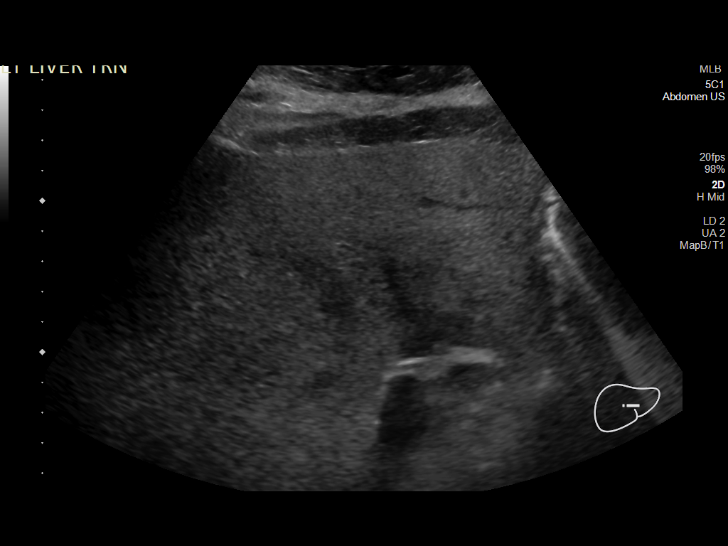
[im 24/39]
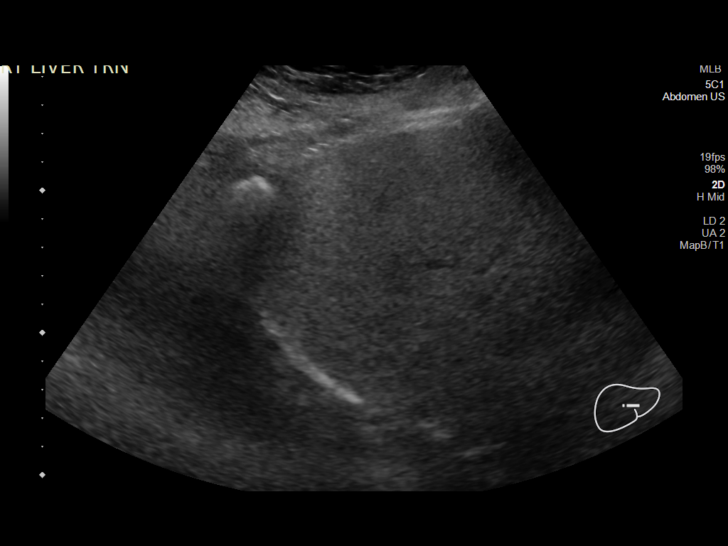
[im 26/39]
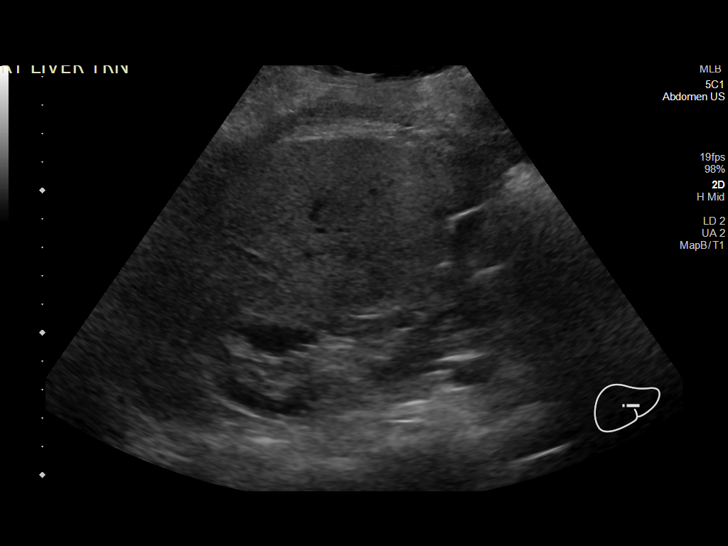
[im 29/39]
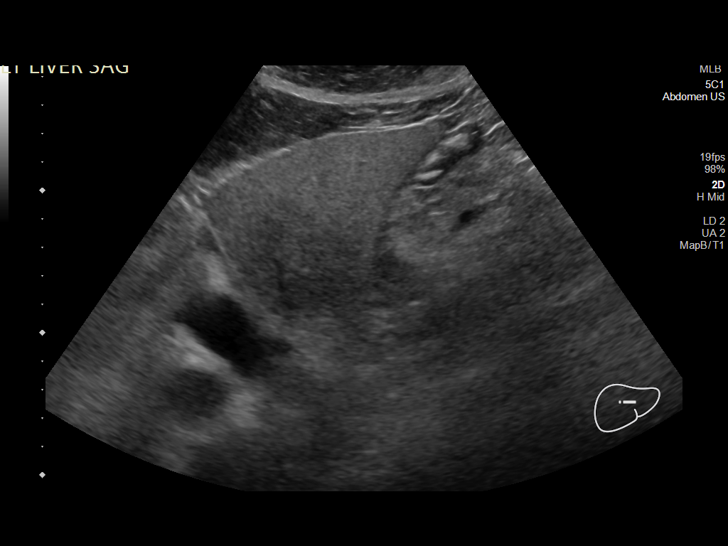
[im 32/39]
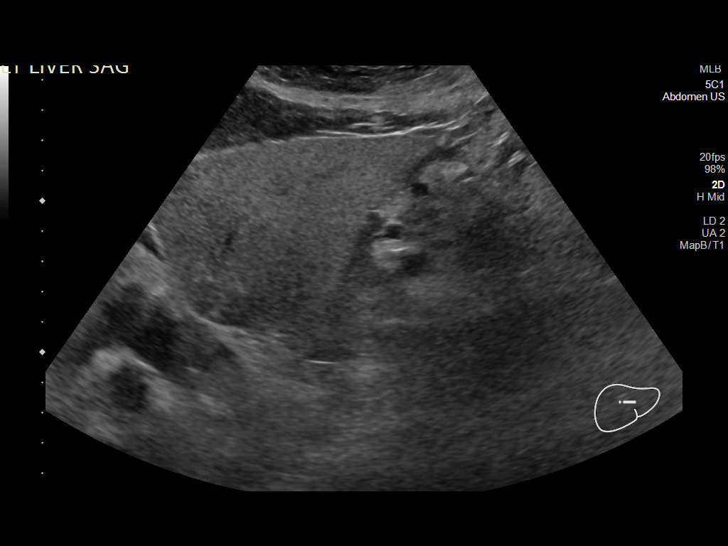
[im 35/39]
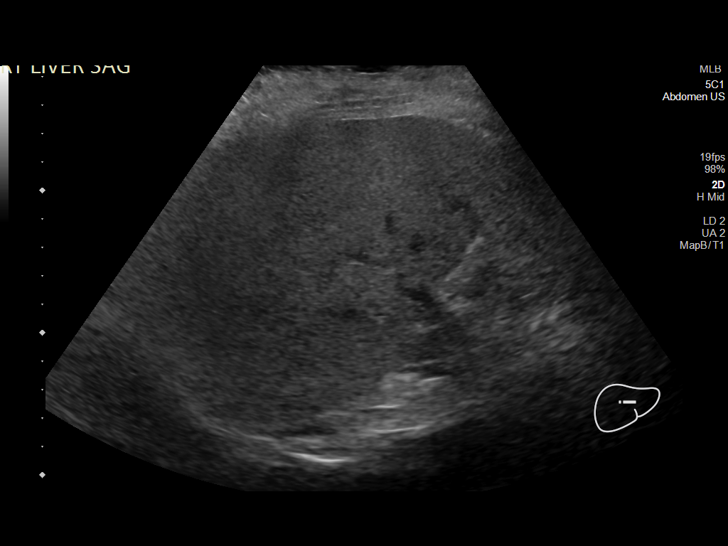
[im 39/39]
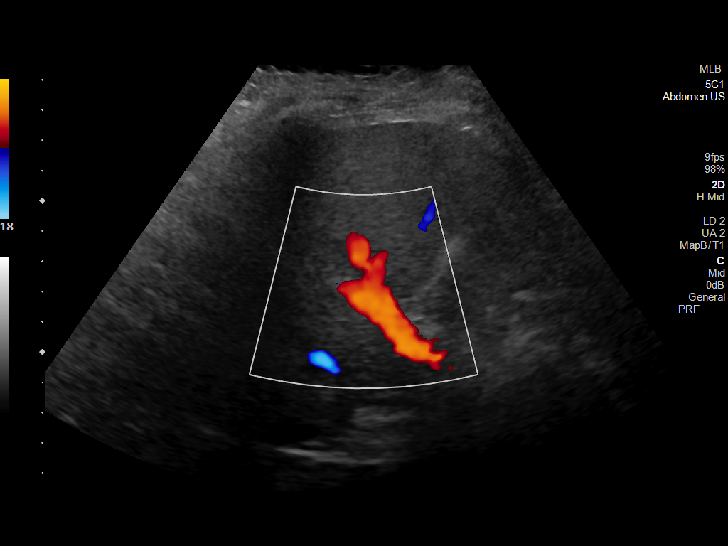

[14 of 25 positions shown; findings below may reference images not displayed]

FINDINGS: Gallbladder:

No gallstones or wall thickening visualized. No sonographic Murphy
sign noted by sonographer.

Common bile duct:

Diameter: Normal at 3 mm

Liver:

No focal lesion identified. Uniform increase in hepatic
echogenicity. Portal vein is patent on color Doppler imaging with
normal direction of blood flow towards the liver.

Other: None.
IMPRESSION: 1. Normal gallbladder and biliary tree.
2. Mild increase in hepatic echogenicity may represent hepatic
steatosis.

## 2023-06-22 ENCOUNTER — Other Ambulatory Visit: Payer: Self-pay | Admitting: Family

## 2023-06-22 DIAGNOSIS — F5101 Primary insomnia: Secondary | ICD-10-CM

## 2023-06-22 DIAGNOSIS — E1165 Type 2 diabetes mellitus with hyperglycemia: Secondary | ICD-10-CM

## 2023-07-16 ENCOUNTER — Other Ambulatory Visit: Payer: Self-pay | Admitting: Family

## 2023-07-16 DIAGNOSIS — E1165 Type 2 diabetes mellitus with hyperglycemia: Secondary | ICD-10-CM

## 2023-07-18 ENCOUNTER — Ambulatory Visit (INDEPENDENT_AMBULATORY_CARE_PROVIDER_SITE_OTHER): Admitting: Family

## 2023-07-18 ENCOUNTER — Encounter: Payer: Self-pay | Admitting: Family

## 2023-07-18 VITALS — BP 138/82 | HR 69 | Temp 97.8°F | Ht 59.0 in | Wt 151.5 lb

## 2023-07-18 DIAGNOSIS — E785 Hyperlipidemia, unspecified: Secondary | ICD-10-CM

## 2023-07-18 DIAGNOSIS — E1159 Type 2 diabetes mellitus with other circulatory complications: Secondary | ICD-10-CM

## 2023-07-18 DIAGNOSIS — Z7984 Long term (current) use of oral hypoglycemic drugs: Secondary | ICD-10-CM

## 2023-07-18 DIAGNOSIS — E1169 Type 2 diabetes mellitus with other specified complication: Secondary | ICD-10-CM | POA: Diagnosis not present

## 2023-07-18 DIAGNOSIS — N3090 Cystitis, unspecified without hematuria: Secondary | ICD-10-CM

## 2023-07-18 DIAGNOSIS — N1831 Chronic kidney disease, stage 3a: Secondary | ICD-10-CM

## 2023-07-18 DIAGNOSIS — E1165 Type 2 diabetes mellitus with hyperglycemia: Secondary | ICD-10-CM

## 2023-07-18 DIAGNOSIS — F5101 Primary insomnia: Secondary | ICD-10-CM

## 2023-07-18 DIAGNOSIS — I152 Hypertension secondary to endocrine disorders: Secondary | ICD-10-CM

## 2023-07-18 DIAGNOSIS — I700 Atherosclerosis of aorta: Secondary | ICD-10-CM

## 2023-07-18 LAB — LIPID PANEL
Cholesterol: 168 mg/dL (ref 0–200)
HDL: 40.4 mg/dL (ref >39.00)
LDL Cholesterol: 109 mg/dL — ABNORMAL HIGH (ref 0–99)
NonHDL: 128.07
Total CHOL/HDL Ratio: 4
Triglycerides: 95 mg/dL (ref 0.0–149.0)
VLDL: 19 mg/dL (ref 0.0–40.0)

## 2023-07-18 LAB — POCT URINALYSIS DIPSTICK
Bilirubin, UA: NEGATIVE
Blood, UA: NEGATIVE
Glucose, UA: POSITIVE — AB
Ketones, UA: NEGATIVE
Nitrite, UA: NEGATIVE
Protein, UA: POSITIVE — AB
Spec Grav, UA: >=1.03 — AB (ref 1.010–1.025)
Urobilinogen, UA: 0.2 U/dL
pH, UA: 6 (ref 5.0–8.0)

## 2023-07-18 LAB — COMPREHENSIVE METABOLIC PANEL WITH GFR
ALT: 18 U/L (ref 0–35)
AST: 21 U/L (ref 0–37)
Albumin: 4.2 g/dL (ref 3.5–5.2)
Alkaline Phosphatase: 58 U/L (ref 39–117)
BUN: 17 mg/dL (ref 6–23)
CO2: 28 meq/L (ref 19–32)
Calcium: 9 mg/dL (ref 8.4–10.5)
Chloride: 102 meq/L (ref 96–112)
Creatinine, Ser: 1.08 mg/dL (ref 0.40–1.20)
GFR: 49.19 mL/min — ABNORMAL LOW (ref >60.00)
Glucose, Bld: 203 mg/dL — ABNORMAL HIGH (ref 70–99)
Potassium: 3.8 meq/L (ref 3.5–5.1)
Sodium: 137 meq/L (ref 135–145)
Total Bilirubin: 1 mg/dL (ref 0.2–1.2)
Total Protein: 6.6 g/dL (ref 6.0–8.3)

## 2023-07-18 LAB — HEMOGLOBIN A1C: Hgb A1c MFr Bld: 9.5 % — ABNORMAL HIGH (ref 4.6–6.5)

## 2023-07-18 MED ORDER — LOSARTAN POTASSIUM 50 MG PO TABS: 50.0000 mg | ORAL_TABLET | Freq: Every day | ORAL | 1 refills | Status: DC

## 2023-07-18 MED ORDER — TRAZODONE HCL 50 MG PO TABS
25.0000 mg | ORAL_TABLET | Freq: Every evening | ORAL | 1 refills | Status: AC | PRN
Start: 2023-07-18 — End: ?

## 2023-07-18 MED ORDER — METFORMIN HCL ER 500 MG PO TB24
ORAL_TABLET | ORAL | 1 refills | Status: DC
Start: 2023-07-18 — End: 2024-01-12

## 2023-07-18 MED ORDER — EMPAGLIFLOZIN 10 MG PO TABS: 10.0000 mg | ORAL_TABLET | Freq: Every day | ORAL | 1 refills | Status: DC

## 2023-07-18 NOTE — Patient Instructions (Addendum)
 It was very nice to see you today!   I will review your lab results via MyChart in a few days.  Be sure to get the application done for the Jardiance . Keep taking all of your medications as directed, if your A1C (diabetes) is high again we will have to increase the Glipizide , I will let you know. Remember to avoid sweets, only 1-2 servings of fruit daily and avoid white carbohydrates - potatoes, white bread, snacks, rice, etc.  SCHEDULE A 4 MONTH follow up visit today.    PLEASE NOTE:  If you had any lab tests please let us  know if you have not heard back within a few days. You may see your results on MyChart before we have a chance to review them but we will give you a call once they are reviewed by us . If we ordered any referrals today, please let us  know if you have not heard from their office within the next week.

## 2023-07-18 NOTE — Assessment & Plan Note (Signed)
 A1c improved from 9.8 to 8.2 in 08/2022, but pt not seen since then, has not followed up as advised. Not checking CBGs at home.  Inconsistent Jardiance  use due to cost affects glycemic control and renal protection. Unable to increase Metformin  dose due to renal disease. Under nephrology care. - Check A1c today. - Increase glipizide  dosage if A1c remains high. - Encourage completion of assistance application for Jardiance . Samples provided today. - Monitor kidney function with nephrology follow-up. - F/U in 4 months

## 2023-07-18 NOTE — Assessment & Plan Note (Signed)
 chronic - intermittent refilling Trazodone  today advised to take only prn  f/u in 6 mos or prn

## 2023-07-18 NOTE — Progress Notes (Addendum)
 Patient ID: Misty Cervantes, female    DOB: [birth year], 78 y.o.   MRN: [id number]  Chief Complaint  Patient presents with   Diabetes   Hypertension   Hyperlipidemia   Chronic Kidney Disease   Insomnia  Discussed the use of AI scribe software for clinical note transcription with the patient, who gave verbal consent to proceed.  History of Present Illness Misty Cervantes is a 78 year old female with diabetes, HTN, insomnia and chronic kidney disease who presents for a follow-up visit.  She is having difficulty affording her medication, particularly Jardiance , which costs $157 for 30 pills after insurance. She has been using samples from her kidney specialist but ran out yesterday. Her current medications include metformin  once daily in the morning and glipizide  10 mg twice daily. Her last A1c was over 8, down from 9.8, but still not at target levels. She has not been monitoring her blood sugar regularly due to being busy.  She has symptoms suggestive of a urinary tract infection, including burning during urination. She has not yet purchased over-the-counter medication for this.  She describes a cold lasting three to four weeks, with symptoms including a sore throat and nasal congestion. No current coughing.  She notes the appearance of a rash or bruising on her legs, which is not itchy or warm to the touch. She also mentions occasional numbness in her legs, which she attributes to her socks.  Her social history includes caring for her 9 year old grandson, who recently had surgery to remove a cyst from his back. She is busy with family responsibilities, impacting her ability to manage her health conditions.  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 Not checking CBGs at home.  Inconsistent Jardiance  use due to cost affects glycemic control and renal protection.  Unable to increase Metformin  dose due to renal disease. Under nephrology care. - Check A1c today. - Increase glipizide  dosage if A1c remains high. - Encourage completion of assistance application for Jardiance . Samples provided today. - Monitor kidney function with nephrology follow-up. - F/U in 4 months  Chronic kidney disease, stage 3 - Under nephrology care seen last month, labs good per patient. Jardiance  recommended for renal protection but cost is a barrier. Nephrology records not integrated into current system. - Request nephrology records for comprehensive care. - Continue to follow with nephrology  HTN w/T2DM - chronic taking Losartan  50mg  bid, BP ok today - does not check BP regularly at home  refill sent f/u in 4 mos  Insomnia - Hyperlipidemia with diabetes -  Taking pravastatin  daily. Last LDL 122 in 04/2022. - Recheck lipids today - Refill Pravastatin  40mg  qd - F/U in 1 yr  UTI - Suspected UTI with dysuria symptoms. Confirmed with UA. - Send Keflex  500mg  tid x7d, advised culture will be checked, may have to change abt, depending on results.  - Drink at least 2 Liters water daily, do not hold urine for long periods. - F/U if sx are not resolved after finishing abt.  Rash or skin lesion -  Non-itchy, non-warm rash on right lower leg. Appears as light red-pinkish in color, not raised, approx 4cm in square shape. Differential includes fungal rash or phlebitis. - Advise to report any new symptoms or changes in rash.  Subjective:    Outpatient Medications Prior to Visit  Medication Sig Dispense Refill  ACCU-CHEK GUIDE test strip USE 1 STRIP TWICE A DAY 100 strip 2   Accu-Chek Softclix Lancets lancets CHECK BLOOD SUGARS 1-2 TIMES PER DAY. 100 each 2   acetaminophen  (TYLENOL ) 500 MG tablet Take 500-1,000 mg by mouth 2 (two) times daily as needed for headache or mild pain.     ALAVERT  10 MG dissolvable tablet Take 10 mg by mouth daily as needed for allergies.      aspirin  EC 81 MG tablet Take 81 mg by mouth daily. Swallow whole.     Blood Glucose Monitoring Suppl (ACCU-CHEK GUIDE) w/Device KIT Use as needed 1 kit 0   Cyanocobalamin  (B-12 PO) Take 1 tablet by mouth daily.     fluticasone  (FLONASE ) 50 MCG/ACT nasal spray SPRAY 1 SPRAY INTO EACH NOSTRIL TWICE A DAY AS NEEDED FOR ALLERGIES OR RHINITIS 48 mL 0   glipiZIDE  (GLUCOTROL  XL) 5 MG 24 hr tablet TAKE 1 TABLET (5 MG TOTAL) BY MOUTH 2 (TWO) TIMES DAILY AFTER A MEAL. 180 tablet 1   Multiple Vitamin (MULTIVITAMIN WITH MINERALS) TABS tablet Take 1 tablet by mouth daily with breakfast.     pravastatin  (PRAVACHOL ) 40 MG tablet TAKE 1 TABLET BY MOUTH EVERYDAY AT BEDTIME 90 tablet 1   JARDIANCE  10 MG TABS tablet TAKE 1 TABLET BY MOUTH DAILY BEFORE BREAKFAST. 90 tablet 1   losartan  (COZAAR ) 50 MG tablet TAKE 1 TABLET BY MOUTH EVERY DAY 90 tablet 1   metFORMIN  (GLUCOPHAGE -XR) 500 MG 24 hr tablet TAKE 1 TABLET BY MOUTH EVERY DAY WITH BREAKFAST. PT NEEDS OFFICE VISIT! 30 tablet 0   traZODone  (DESYREL ) 50 MG tablet TAKE 0.5-1 TABLETS (25-50 MG TOTAL) BY MOUTH AT BEDTIME AS NEEDED. FOR SLEEP 90 tablet 1   No facility-administered medications prior to visit.   Past Medical History:  Diagnosis Date   Abdominal pain 08/23/2021   Chronic kidney disease (CKD), stage III (moderate) (HCC)    followed by pcp   Dysphagia 01/22/2021   Full dentures    HCAP (healthcare-associated pneumonia) 08/22/2021   Headache 08/23/2021   History of 2019 novel coronavirus disease (COVID-19) 04/05/2019   result in care everywhere ,  per pt mild symptoms that resolved   History of gastric ulcer 2013   non-bleeding   Hyperlipemia    Hypokalemia    Hyponatremia 08/23/2021   Low serum potassium level 01/29/2021   Mixed stress and urge incontinence    Osteoarthritis    Presence of pessary    Respiratory distress 11/28/2021   Sepsis (HCC) 11/28/2021   Type 2 diabetes mellitus (HCC)    followed by pcp   (08-26-2020 per pt checks  blood sugar twice weekly, unsure what fasting sugar is)   UTI (urinary tract infection) 08/23/2021   Vaginal vault 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    gyn--- dr Connell Degree, using pessary   Wears glasses    Past Surgical History:  Procedure Laterality Date   CARPAL TUNNEL RELEASE Right    1980s   CATARACT EXTRACTION W/ INTRAOCULAR LENS IMPLANT Bilateral 2013   approx   COLONOSCOPY WITH ESOPHAGOGASTRODUODENOSCOPY (EGD)  11/17/2011   COLPOCLEISIS N/A 09/02/2020   Procedure: COLPOCLEISIS, PERINEORRHAPHY;  Surgeon: Wanita Gutta, MD;  Location: [facility] [place];  Service: Gynecology;  Laterality: N/A;   CYSTOSCOPY N/A 09/02/2020   Procedure: CYSTOSCOPY;  Surgeon: Wanita Gutta, MD;  Location: [facility];  Service: Gynecology;  Laterality: N/A;   RETINAL DETACHMENT SURGERY Right 2003   TOTAL HIP ARTHROPLASTY Left 03/02/2010   @[facility]   TOTAL KNEE ARTHROPLASTY Right 11/03/2015   Procedure: RIGHT TOTAL KNEE ARTHROPLASTY;  Surgeon: Liliane Rei, MD;  Location: [facility];  Service: Orthopedics;  Laterality: Right;   TOTAL VAGINAL HYSTERECTOMY  2012   TVH, anterior repair, TVT (no mesh used for the anterior repair)   Allergies  Allergen Reactions   Bacid    Latex Other (See Comments)    Exact reaction not cited   Norvasc  [Amlodipine ] Swelling      Objective:    Physical Exam Vitals and nursing note reviewed.  Constitutional:      Appearance: Normal appearance.  Cardiovascular:     Rate and Rhythm: Normal rate and regular rhythm.     Pulses:          Dorsalis pedis pulses are 1+ on the right side and 1+ on the left side.  Pulmonary:     Effort: Pulmonary effort is normal.     Breath sounds: Normal breath sounds.  Musculoskeletal:        General: Normal range of motion.     Right foot: Normal range of motion. No deformity.     Left foot: Normal range of motion. No deformity.  Feet:     Right foot:     Protective Sensation: 10 sites tested.  9 sites  sensed.     Skin integrity: Skin integrity normal.     Toenail Condition: Right toenails are normal.     Left foot:     Protective Sensation: 10 sites tested.  9 sites sensed.     Skin integrity: Skin integrity normal.     Toenail Condition: Left toenails are normal.  Skin:    General: Skin is warm and dry.  Neurological:     Mental Status: She is alert.  Psychiatric:        Mood and Affect: Mood normal.        Behavior: Behavior normal.    BP 138/82 (BP Location: Left Arm, Patient Position: Sitting, Cuff Size: Normal)   Pulse 69   Temp 97.8 F (36.6 C) (Temporal)   Ht 4\' 11"  (1.499 m)   Wt 151 lb 8 oz (68.7 kg)   LMP  (LMP Unknown)   SpO2 96%   BMI 30.60 kg/m  Wt Readings from Last 3 Encounters:  07/18/23 151 lb 8 oz (68.7 kg)  08/26/22 157 lb 2 oz (71.3 kg)  08/19/22 158 lb (71.7 kg)      Versa Gore, NP

## 2023-07-18 NOTE — Assessment & Plan Note (Signed)
 Taking pravastatin  daily. Last LDL 122 in 04/2022. Goal is < 70. - Recheck lipids today - Refill Pravastatin  40mg  qd - F/U in 1 yr

## 2023-07-18 NOTE — Assessment & Plan Note (Signed)
 chronic taking Losartan  50mg  bid, BP ok today - does not check BP regularly at home  refill sent f/u in 4 mos

## 2023-07-18 NOTE — Assessment & Plan Note (Signed)
 Under nephrology care seen last month, labs good per patient. Jardiance  recommended for renal protection but cost is a barrier. Nephrology records not integrated into current system. - Request nephrology records for comprehensive care. - Continue to follow with nephrology

## 2023-07-19 ENCOUNTER — Telehealth: Payer: Self-pay

## 2023-07-19 ENCOUNTER — Telehealth: Payer: Self-pay | Admitting: Family

## 2023-07-19 MED ORDER — CEPHALEXIN 500 MG PO CAPS
500.0000 mg | ORAL_CAPSULE | Freq: Three times a day (TID) | ORAL | 0 refills | Status: DC
Start: 2023-07-19 — End: 2023-07-26

## 2023-07-19 NOTE — Telephone Encounter (Signed)
 addressed in separate message, thx.

## 2023-07-19 NOTE — Telephone Encounter (Signed)
 I returned pt call and lvm letting pt know Rx has been sent.

## 2023-07-19 NOTE — Telephone Encounter (Signed)
-----   Message from Versa Gore sent at 07/19/2023 10:10 AM EDT ----- Regarding: call patient call and let her know I apologize for not sending in the antibiotic yesterday, but it is at her pharmacy now, thanks.

## 2023-07-19 NOTE — Addendum Note (Signed)
 Addended by: Bera Pinela on: 07/19/2023 11:43 AM   Modules accepted: Orders

## 2023-07-19 NOTE — Telephone Encounter (Signed)
 Copied from CRM [id number]. Topic: Clinical - Medication Refill >> Jul 19, 2023  8:27 AM Hamdi H wrote: Most Recent Primary Care Visit:  Provider: Versa Gore  Department: [facility]  Visit Type: OFFICE VISIT  Date: 07/18/2023  Medication: Antibiotic for a UTI, she was told she needed this after yesterday's appointment.   Has the patient contacted their pharmacy? Yes (Agent: If no, request that the patient contact the pharmacy for the refill. If patient does not wish to contact the pharmacy document the reason why and proceed with request.) (Agent: If yes, when and what did the pharmacy advise?) Nothing was prescribed   Is this the correct pharmacy for this prescription? Yes If no, delete pharmacy and type the correct one.  This is the patient's preferred pharmacy:  [id number] [place], [place] - [address] [place] [address] Phone: [phone] Fax: [phone]   Has the prescription been filled recently? No  Is the patient out of the medication? Yes  Has the patient been seen for an appointment in the last year OR does the patient have an upcoming appointment? Yes  Can we respond through MyChart? Yes  Agent: Please be advised that Rx refills may take up to 3 business days. We ask that you follow-up with your pharmacy.

## 2023-07-20 ENCOUNTER — Telehealth: Payer: Self-pay

## 2023-07-20 ENCOUNTER — Other Ambulatory Visit: Payer: Self-pay

## 2023-07-20 DIAGNOSIS — N3090 Cystitis, unspecified without hematuria: Secondary | ICD-10-CM

## 2023-07-20 MED ORDER — CEPHALEXIN 500 MG PO CAPS
500.0000 mg | ORAL_CAPSULE | Freq: Three times a day (TID) | ORAL | 0 refills | Status: AC
Start: 2023-07-20 — End: 2023-07-27

## 2023-07-20 NOTE — Telephone Encounter (Signed)
 Copied from CRM [id number]. Topic: Clinical - Prescription Issue >> Jul 20, 2023 10:11 AM Everlene Hobby D wrote: Patient says her pcp told her she would send her something to the  pharmacy for the UTI she went to the pharmacy and they still haven't gotten anything.   I returned pt's call, RX has been re sent.

## 2023-07-20 NOTE — Telephone Encounter (Signed)
 Copied from CRM [id number]. Topic: Clinical - Prescription Issue >> Jul 19, 2023  3:28 PM Turkey A wrote: Reason for CRM: Patient call back and said pharmacy's fax was down a period of time and prescription needs to be faxed over again   RX resent

## 2023-07-22 LAB — URINE CULTURE
MICRO NUMBER:: 16419345
SPECIMEN QUALITY:: ADEQUATE

## 2023-07-25 ENCOUNTER — Encounter: Payer: Self-pay | Admitting: Family

## 2023-07-25 MED ORDER — GLIPIZIDE ER 10 MG PO TB24
10.0000 mg | ORAL_TABLET | Freq: Two times a day (BID) | ORAL | 5 refills | Status: DC
Start: 2023-07-25 — End: 2024-01-26

## 2023-07-25 NOTE — Progress Notes (Signed)
 See result message below. Please call patient in 1-2 days to confirm she understands instructions.

## 2023-07-25 NOTE — Addendum Note (Signed)
 Addended by: Kensy Blizard on: 07/25/2023 08:47 AM   Modules accepted: Orders

## 2023-07-26 ENCOUNTER — Ambulatory Visit: Payer: Self-pay

## 2023-09-12 ENCOUNTER — Ambulatory Visit: Payer: Medicare Other

## 2023-09-12 ENCOUNTER — Other Ambulatory Visit: Payer: Self-pay | Admitting: Family

## 2023-09-12 DIAGNOSIS — E1165 Type 2 diabetes mellitus with hyperglycemia: Secondary | ICD-10-CM

## 2023-09-27 ENCOUNTER — Ambulatory Visit (INDEPENDENT_AMBULATORY_CARE_PROVIDER_SITE_OTHER)

## 2023-09-27 VITALS — BP 130/80 | HR 91 | Temp 98.7°F | Ht 59.0 in | Wt 150.8 lb

## 2023-09-27 DIAGNOSIS — Z0000 Encounter for general adult medical examination without abnormal findings: Secondary | ICD-10-CM

## 2023-09-27 NOTE — Patient Instructions (Signed)
 [name] , Thank you for taking time out of your busy schedule to complete your Annual Wellness Visit with me. I enjoyed our conversation and look forward to speaking with you again next year. I, as well as your care team,  appreciate your ongoing commitment to your health goals. Please review the following plan we discussed and let me know if I can assist you in the future. Your Game plan/ To Do List    Referrals: If you haven't heard from the office you've been referred to, please reach out to them at the phone provided.   Follow up Visits: Next Medicare AWV with our clinical staff: 10/02/24   Have you seen your provider in the last 6 months (3 months if uncontrolled diabetes)? Yes Next Office Visit with your provider: will make follow up appt   Clinician Recommendations:  Aim for 30 minutes of exercise or brisk walking, 6-8 glasses of water, and 5 servings of fruits and vegetables each day.        This is a list of the screening recommended for you and due dates:  Health Maintenance  Topic Date Due   Eye exam for diabetics  08/06/2017   Yearly kidney health urinalysis for diabetes  07/12/2020   Medicare Annual Wellness Visit  08/19/2023   DTaP/Tdap/Td vaccine (1 - Tdap) 07/17/2024*   Flu Shot  10/14/2023   Hemoglobin A1C  01/18/2024   Yearly kidney function blood test for diabetes  07/17/2024   Complete foot exam   07/17/2024   DEXA scan (bone density measurement)  Completed   Zoster (Shingles) Vaccine  Completed   Hepatitis B Vaccine  Aged Out   HPV Vaccine  Aged Out   Meningitis B Vaccine  Aged Out   Pneumococcal Vaccine for age over 77  Discontinued   Colon Cancer Screening  Discontinued   COVID-19 Vaccine  Discontinued   Hepatitis C Screening  Discontinued  *Topic was postponed. The date shown is not the original due date.    Advanced directives: (Provided) Advance directive discussed with you today. I have provided a copy for you to complete at home and have notarized.  Once this is complete, please bring a copy in to our office so we can scan it into your chart.  Advance Care Planning is important because it:  [x]  Makes sure you receive the medical care that is consistent with your values, goals, and preferences  [x]  It provides guidance to your family and loved ones and reduces their decisional burden about whether or not they are making the right decisions based on your wishes.  Follow the link provided in your after visit summary or read over the paperwork we have mailed to you to help you started getting your Advance Directives in place. If you need assistance in completing these, please reach out to us  so that we can help you!  See attachments for Preventive Care and Fall Prevention Tips.

## 2023-09-27 NOTE — Progress Notes (Signed)
 Subjective:   Misty Cervantes is a 78 y.o. who presents for a Medicare Wellness preventive visit.  As a reminder, Annual Wellness Visits don't include a physical exam, and some assessments may be limited, especially if this visit is performed virtually. We may recommend an in-person follow-up visit with your provider if needed.  Visit Complete: In person    Persons Participating in Visit: Patient.  AWV Questionnaire: No: Patient Medicare AWV questionnaire was not completed prior to this visit.  Cardiac Risk Factors include: advanced age (>66men, >6 women);diabetes mellitus;dyslipidemia;hypertension;obesity (BMI >30kg/m2)     Objective:    Today's Vitals   09/27/23 0828  BP: 130/80  Pulse: 91  Temp: 98.7 F (37.1 C)  SpO2: 97%  Weight: 150 lb 12.8 oz (68.4 kg)  Height: 4' 11 (1.499 m)   Body mass index is 30.46 kg/m.     09/27/2023    8:41 AM 08/19/2022   10:25 AM 11/27/2021    8:15 PM 08/22/2021   10:00 PM 08/22/2021    3:18 PM 07/30/2021    1:54 PM 07/16/2021   12:23 PM  Advanced Directives  Does Patient Have a Medical Advance Directive? No No No No No No No  Would patient like information on creating a medical advance directive? No - Patient declined Yes (MAU/Ambulatory/Procedural Areas - Information given)  No - Patient declined   Yes (MAU/Ambulatory/Procedural Areas - Information given)    Current Medications (verified) Outpatient Encounter Medications as of 09/27/2023  Medication Sig   ACCU-CHEK GUIDE test strip USE 1 STRIP TWICE A DAY   Accu-Chek Softclix Lancets lancets CHECK BLOOD SUGARS 1-2 TIMES PER DAY.   acetaminophen  (TYLENOL ) 500 MG tablet Take 500-1,000 mg by mouth 2 (two) times daily as needed for headache or mild pain.   ALAVERT  10 MG dissolvable tablet Take 10 mg by mouth daily as needed for allergies.   aspirin  EC 81 MG tablet Take 81 mg by mouth daily. Swallow whole.   Blood Glucose Monitoring Suppl (ACCU-CHEK GUIDE) w/Device KIT Use as needed    Cyanocobalamin  (B-12 PO) Take 1 tablet by mouth daily.   fluticasone  (FLONASE ) 50 MCG/ACT nasal spray SPRAY 1 SPRAY INTO EACH NOSTRIL TWICE A DAY AS NEEDED FOR ALLERGIES OR RHINITIS   glipiZIDE  (GLUCOTROL  XL) 10 MG 24 hr tablet Take 1 tablet (10 mg total) by mouth 2 (two) times daily after a meal.   losartan  (COZAAR ) 50 MG tablet Take 1 tablet (50 mg total) by mouth daily.   metFORMIN  (GLUCOPHAGE -XR) 500 MG 24 hr tablet TAKE 1 TABLET BY MOUTH EVERY DAY WITH BREAKFAST.   Multiple Vitamin (MULTIVITAMIN WITH MINERALS) TABS tablet Take 1 tablet by mouth daily with breakfast.   pravastatin  (PRAVACHOL ) 40 MG tablet TAKE 1 TABLET BY MOUTH EVERYDAY AT BEDTIME   traZODone  (DESYREL ) 50 MG tablet Take 0.5-1 tablets (25-50 mg total) by mouth at bedtime as needed. for sleep   empagliflozin  (JARDIANCE ) 10 MG TABS tablet Take 1 tablet (10 mg total) by mouth daily before breakfast. (Patient not taking: Reported on 09/27/2023)   No facility-administered encounter medications on file as of 09/27/2023.    Allergies (verified) Bacid, Latex, and Norvasc  [amlodipine ]   History: Past Medical History:  Diagnosis Date   Abdominal pain 08/23/2021   Chronic kidney disease (CKD), stage III (moderate) (HCC)    followed by pcp   Dysphagia 01/22/2021   Full dentures    HCAP (healthcare-associated pneumonia) 08/22/2021   Headache 08/23/2021   History of 2019 novel coronavirus  disease (COVID-19) 04/05/2019   result in care everywhere ,  per pt mild symptoms that resolved   History of gastric ulcer 2013   non-bleeding   Hyperlipemia    Hypokalemia    Hyponatremia 08/23/2021   Low serum potassium level 01/29/2021   Mixed stress and urge incontinence    Osteoarthritis    Presence of pessary    Respiratory distress 11/28/2021   Sepsis (HCC) 11/28/2021   Type 2 diabetes mellitus (HCC)    followed by pcp   (08-26-2020 per pt checks blood sugar twice weekly, unsure what fasting sugar is)   UTI (urinary tract  infection) 08/23/2021   Vaginal vault 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    gyn--- dr jannis, using pessary   Wears glasses    Past Surgical History:  Procedure Laterality Date   CARPAL TUNNEL RELEASE Right    1980s   CATARACT EXTRACTION W/ INTRAOCULAR LENS IMPLANT Bilateral 2013   approx   COLONOSCOPY WITH ESOPHAGOGASTRODUODENOSCOPY (EGD)  11/17/2011   COLPOCLEISIS N/A 09/02/2020   Procedure: COLPOCLEISIS, PERINEORRHAPHY;  Surgeon: jannis Kate Norris, MD;  Location: [facility] [place];  Service: Gynecology;  Laterality: N/A;   CYSTOSCOPY N/A 09/02/2020   Procedure: CYSTOSCOPY;  Surgeon: jannis Kate Norris, MD;  Location: [facility];  Service: Gynecology;  Laterality: N/A;   RETINAL DETACHMENT SURGERY Right 2003   TOTAL HIP ARTHROPLASTY Left 03/02/2010   @[facility]    TOTAL KNEE ARTHROPLASTY Right 11/03/2015   Procedure: RIGHT TOTAL KNEE ARTHROPLASTY;  Surgeon: Dempsey Moan, MD;  Location: [facility];  Service: Orthopedics;  Laterality: Right;   TOTAL VAGINAL HYSTERECTOMY  2012   TVH, anterior repair, TVT (no mesh used for the anterior repair)   Family History  Problem Relation Age of Onset   Diabetes Mother        deceased   Heart attack Mother    Diabetes Sister        x2   Kidney cancer Father    Heart attack Father    Diabetes Sister    Social History   Socioeconomic History   Marital status: Divorced    Spouse name: Not on file   Number of children: Not on file   Years of education: Not on file   Highest education level: Not on file  Occupational History   Occupation: retired    Associate Professor: RETIRED  Tobacco Use   Smoking status: Never   Smokeless tobacco: Never  Vaping Use   Vaping status: Never Used  Substance and Sexual Activity   Alcohol use: No   Drug use: No   Sexual activity: Not Currently    Partners: Male    Birth control/protection: Surgical    Comment: hysterectomy  Other Topics Concern   Not on file  Social History Narrative    Divorced, 2 children; retired.    Custody of grandchildren    Social Drivers of Corporate investment banker Strain: Low Risk  (09/27/2023)   Overall Financial Resource Strain (CARDIA)    Difficulty of Paying Living Expenses: Not hard at all  Food Insecurity: No Food Insecurity (09/27/2023)   Hunger Vital Sign    Worried About Running Out of Food in the Last Year: Never true    Ran Out of Food in the Last Year: Never true  Transportation Needs: No Transportation Needs (09/27/2023)   PRAPARE - Administrator, Civil Service (Medical): No    Lack of Transportation (Non-Medical): No  Physical Activity: Inactive (09/27/2023)  Exercise Vital Sign    Days of Exercise per Week: 0 days    Minutes of Exercise per Session: 0 min  Stress: No Stress Concern Present (09/27/2023)   [employer] of Occupational Health - Occupational Stress Questionnaire    Feeling of Stress: Not at all  Social Connections: Socially Isolated (09/27/2023)   Social Connection and Isolation Panel    Frequency of Communication with Friends and Family: More than three times a week    Frequency of Social Gatherings with Friends and Family: More than three times a week    Attends Religious Services: Never    Database administrator or Organizations: No    Attends Engineer, structural: Never    Marital Status: Divorced    Tobacco Counseling Counseling given: Not Answered    Clinical Intake:  Pre-visit preparation completed: Yes  Pain : No/denies pain     BMI - recorded: 30.46 Nutritional Status: BMI > 30  Obese Nutritional Risks: None Diabetes: Yes CBG done?: No Did pt. bring in CBG monitor from home?: No  Lab Results  Component Value Date   HGBA1C 9.5 (H) 07/18/2023   HGBA1C 8.2 (H) 08/26/2022   HGBA1C 9.6 (H) 05/03/2022     How often do you need to have someone help you when you read instructions, pamphlets, or other written materials from your doctor or pharmacy?: 1 -  Never  Interpreter Needed?: No  Information entered by :: Ellouise Haws, LPN   Activities of Daily Living     09/27/2023    8:38 AM  In your present state of health, do you have any difficulty performing the following activities:  Hearing? 1  Comment slight HOH  Vision? 0  Difficulty concentrating or making decisions? 0  Walking or climbing stairs? 1  Comment at times  Dressing or bathing? 0  Doing errands, shopping? 0  Preparing Food and eating ? N  Using the Toilet? N  In the past six months, have you accidently leaked urine? N  Do you have problems with loss of bowel control? N  Managing your Medications? N  Managing your Finances? N  Housekeeping or managing your Housekeeping? N    Patient Care Team: Lucius Krabbe, NP as PCP - General (Family Medicine) Melodi Lerner, MD as Consulting Physician (Orthopedic Surgery) Jannis Kate Norris, MD as Consulting Physician (Obstetrics and Gynecology) Camillo Golas, MD as Consulting Physician (Ophthalmology) Chabon, Stephen, PA-C as Physician Assistant (Physician Assistant) Jannis Kate Norris, MD as Consulting Physician (Obstetrics and Gynecology)  I have updated your Care Teams any recent Medical Services you may have received from other providers in the past year.     Assessment:   This is a routine wellness examination for [place].  Hearing/Vision screen Hearing Screening - Comments:: Pt stated slight HOH  Vision Screening - Comments:: Wears rx glasses - up to date with routine eye exams with [facility]     Goals Addressed             This Visit's Progress    Patient Stated       Maintain health and activity        Depression Screen     09/27/2023    8:50 AM 08/19/2022   10:20 AM 05/03/2022   11:16 AM 07/16/2021   12:21 PM 01/22/2021    9:13 AM 07/11/2020   11:49 AM 02/13/2020   10:36 AM  PHQ 2/9 Scores  PHQ - 2 Score 0 2 4 2  0 0  0  PHQ- 9 Score  5 8 4        Fall Risk     09/27/2023    8:48 AM  08/19/2022   10:26 AM 05/03/2022   11:14 AM 07/16/2021   12:25 PM 07/11/2020   11:56 AM  Fall Risk   Falls in the past year? 0 0 0 0 0  Number falls in past yr: 0 0 0 0 0  Injury with Fall? 0 0 0 0 0  Risk for fall due to : Impaired balance/gait Impaired vision No Fall Risks Impaired vision Impaired balance/gait;Impaired vision  Follow up Falls prevention discussed Falls prevention discussed Falls evaluation completed;Education provided Falls prevention discussed  Falls prevention discussed      Data saved with a previous flowsheet row definition    MEDICARE RISK AT HOME:  Medicare Risk at Home Any stairs in or around the home?: No If so, are there any without handrails?: No Home free of loose throw rugs in walkways, pet beds, electrical cords, etc?: Yes Adequate lighting in your home to reduce risk of falls?: Yes Life alert?: No Use of a cane, walker or w/c?: No Grab bars in the bathroom?: No Shower chair or bench in shower?: No Elevated toilet seat or a handicapped toilet?: No  TIMED UP AND GO:  Was the test performed?  Yes  Length of time to ambulate 10 feet: 15 sec Gait slow and steady without use of assistive device  Cognitive Function: 6CIT completed    10/21/2017    2:50 PM  MMSE - Mini Mental State Exam  Not completed: --        09/27/2023    8:48 AM 08/19/2022   10:29 AM 07/16/2021   12:28 PM 07/11/2020   12:01 PM 05/24/2019    1:55 PM  6CIT Screen  What Year? 0 points 0 points 0 points 0 points 0 points  What month? 0 points 0 points 0 points 0 points 0 points  What time? 0 points 0 points 0 points  0 points  Count back from 20 0 points 0 points 0 points 0 points 0 points  Months in reverse 0 points 0 points 0 points 0 points 0 points  Repeat phrase 0 points 0 points 0 points 0 points 0 points  Total Score 0 points 0 points 0 points  0 points    Immunizations Immunization History  Administered Date(s) Administered   Fluad Quad(high Dose 65+) 11/19/2018    Influenza, High Dose Seasonal PF 04/11/2018   Influenza,inj,Quad PF,6+ Mos 01/28/2015   PFIZER(Purple Top)SARS-COV-2 Vaccination 08/11/2019, 09/02/2019, 05/28/2020   Pneumococcal Polysaccharide-23 01/01/2011, 04/11/2018   Zoster Recombinant(Shingrix) 03/26/2018, 11/19/2018    Screening Tests Health Maintenance  Topic Date Due   OPHTHALMOLOGY EXAM  08/06/2017   Diabetic kidney evaluation - Urine ACR  07/12/2020   DTaP/Tdap/Td (1 - Tdap) 07/17/2024 (Originally 12/31/1964)   INFLUENZA VACCINE  10/14/2023   HEMOGLOBIN A1C  01/18/2024   Diabetic kidney evaluation - eGFR measurement  07/17/2024   FOOT EXAM  07/17/2024   Medicare Annual Wellness (AWV)  09/26/2024   DEXA SCAN  Completed   Zoster Vaccines- Shingrix  Completed   Hepatitis B Vaccines  Aged Out   HPV VACCINES  Aged Out   Meningococcal B Vaccine  Aged Out   Pneumococcal Vaccine: 50+ Years  Discontinued   Colonoscopy  Discontinued   COVID-19 Vaccine  Discontinued   Hepatitis C Screening  Discontinued    Health Maintenance  Health Maintenance Due  Topic Date Due   OPHTHALMOLOGY EXAM  08/06/2017   Diabetic kidney evaluation - Urine ACR  07/12/2020   Health Maintenance Items Addressed: See Nurse Notes at the end of this note  Additional Screening:  Vision Screening: Recommended annual ophthalmology exams for early detection of glaucoma and other disorders of the eye. Would you like a referral to an eye doctor? No    Dental Screening: Recommended annual dental exams for proper oral hygiene  Community Resource Referral / Chronic Care Management: CRR required this visit?  No   CCM required this visit?  No   Plan:    I have personally reviewed and noted the following in the patient's chart:   Medical and social history Use of alcohol, tobacco or illicit drugs  Current medications and supplements including opioid prescriptions. Patient is not currently taking opioid prescriptions. Functional ability and  status Nutritional status Physical activity Advanced directives List of other physicians Hospitalizations, surgeries, and ER visits in previous 12 months Vitals Screenings to include cognitive, depression, and falls Referrals and appointments  In addition, I have reviewed and discussed with patient certain preventive protocols, quality metrics, and best practice recommendations. A written personalized care plan for preventive services as well as general preventive health recommendations were provided to patient.   Ellouise VEAR Haws, LPN   [id number]/84/[id number]   After Visit Summary: (In Person-Printed) AVS printed and given to the patient  Notes: Nothing significant to report at this time.

## 2023-10-08 ENCOUNTER — Other Ambulatory Visit: Payer: Self-pay | Admitting: Family

## 2023-10-08 DIAGNOSIS — E1169 Type 2 diabetes mellitus with other specified complication: Secondary | ICD-10-CM

## 2023-10-23 ENCOUNTER — Emergency Department (HOSPITAL_BASED_OUTPATIENT_CLINIC_OR_DEPARTMENT_OTHER)

## 2023-10-23 ENCOUNTER — Encounter (HOSPITAL_BASED_OUTPATIENT_CLINIC_OR_DEPARTMENT_OTHER): Payer: Self-pay | Admitting: Emergency Medicine

## 2023-10-23 ENCOUNTER — Emergency Department (HOSPITAL_BASED_OUTPATIENT_CLINIC_OR_DEPARTMENT_OTHER): Admission: EM | Admit: 2023-10-23 | Discharge: 2023-10-23 | Disposition: A | Discharge status: Home / Self Care

## 2023-10-23 DIAGNOSIS — R0781 Pleurodynia: Secondary | ICD-10-CM | POA: Diagnosis not present

## 2023-10-23 DIAGNOSIS — M25512 Pain in left shoulder: Secondary | ICD-10-CM | POA: Insufficient documentation

## 2023-10-23 DIAGNOSIS — W1830XA Fall on same level, unspecified, initial encounter: Secondary | ICD-10-CM | POA: Diagnosis not present

## 2023-10-23 DIAGNOSIS — W19XXXA Unspecified fall, initial encounter: Secondary | ICD-10-CM

## 2023-10-23 DIAGNOSIS — S63502A Unspecified sprain of left wrist, initial encounter: Secondary | ICD-10-CM | POA: Insufficient documentation

## 2023-10-23 DIAGNOSIS — M25532 Pain in left wrist: Secondary | ICD-10-CM | POA: Diagnosis present

## 2023-10-23 DIAGNOSIS — Z7982 Long term (current) use of aspirin: Secondary | ICD-10-CM | POA: Diagnosis not present

## 2023-10-23 DIAGNOSIS — Z91040 Latex allergy status: Secondary | ICD-10-CM | POA: Diagnosis not present

## 2023-10-23 DIAGNOSIS — M25562 Pain in left knee: Secondary | ICD-10-CM | POA: Diagnosis not present

## 2023-10-23 DIAGNOSIS — M25552 Pain in left hip: Secondary | ICD-10-CM | POA: Diagnosis not present

## 2023-10-23 NOTE — Discharge Instructions (Signed)
 You can wear your wrist splint as needed.  You can take Tylenol  and Motrin as needed for pain.  Follow-up with orthopedic surgery as needed if your symptoms do not improve.  The phone numbers listed below.

## 2023-10-23 NOTE — ED Triage Notes (Signed)
 Mechanical fall on left side yesterday.  C/o pain in left rib, left arm shoulder to hand, left hip and thigh.   Denies hitting head or LOC.

## 2023-10-23 NOTE — ED Provider Notes (Signed)
 [place] EMERGENCY DEPARTMENT AT [facility] Provider Note   CSN: [id number] Arrival date & time: 10/23/23  1300     Patient presents with: Misty Cervantes is a 78 y.o. female.   78 year old female presents for evaluation of pain after a fall.  States she fell last night and hurt her left arm and left leg and left ribs.  States the pain has gotten worse.  Has not been taking anything at home for pain.  Denies hitting her head or losing consciousness.  She is not on any blood thinners.  Denies any other symptoms or concerns at this time.   Fall Pertinent negatives include no chest pain, no abdominal pain and no shortness of breath.       Prior to Admission medications   Medication Sig Start Date End Date Taking? Authorizing Provider  ACCU-CHEK GUIDE test strip USE 1 STRIP TWICE A DAY 10/04/22   Lucius Krabbe, NP  Accu-Chek Softclix Lancets lancets CHECK BLOOD SUGARS 1-2 TIMES PER DAY. 09/12/23   Lucius Krabbe, NP  acetaminophen  (TYLENOL ) 500 MG tablet Take 500-1,000 mg by mouth 2 (two) times daily as needed for headache or mild pain.    [provider]  ALAVERT  10 MG dissolvable tablet Take 10 mg by mouth daily as needed for allergies.    [provider]  aspirin  EC 81 MG tablet Take 81 mg by mouth daily. Swallow whole.    [provider]  Blood Glucose Monitoring Suppl (ACCU-CHEK GUIDE) w/Device KIT Use as needed 09/07/21   Lucius Krabbe, NP  Cyanocobalamin  (B-12 PO) Take 1 tablet by mouth daily.    [provider]  empagliflozin  (JARDIANCE ) 10 MG TABS tablet Take 1 tablet (10 mg total) by mouth daily before breakfast. Patient not taking: Reported on 09/27/2023 07/18/23   Lucius Krabbe, NP  fluticasone  (FLONASE ) 50 MCG/ACT nasal spray SPRAY 1 SPRAY INTO EACH NOSTRIL TWICE A DAY AS NEEDED FOR ALLERGIES OR RHINITIS 03/25/23   Lucius Krabbe, NP  glipiZIDE  (GLUCOTROL  XL) 10 MG 24 hr tablet Take 1 tablet (10 mg  total) by mouth 2 (two) times daily after a meal. 07/25/23   Hudnell, Krabbe, NP  losartan  (COZAAR ) 50 MG tablet Take 1 tablet (50 mg total) by mouth daily. 07/18/23   Lucius Krabbe, NP  metFORMIN  (GLUCOPHAGE -XR) 500 MG 24 hr tablet TAKE 1 TABLET BY MOUTH EVERY DAY WITH BREAKFAST. 07/18/23   Lucius Krabbe, NP  Multiple Vitamin (MULTIVITAMIN WITH MINERALS) TABS tablet Take 1 tablet by mouth daily with breakfast.    [provider]  pravastatin  (PRAVACHOL ) 40 MG tablet TAKE 1 TABLET BY MOUTH EVERYDAY AT BEDTIME 10/10/23   Lucius Krabbe, NP  traZODone  (DESYREL ) 50 MG tablet Take 0.5-1 tablets (25-50 mg total) by mouth at bedtime as needed. for sleep 07/18/23   Lucius Krabbe, NP    Allergies: Bacid, Latex, and Norvasc  [amlodipine ]    Review of Systems  Constitutional:  Negative for chills and fever.  HENT:  Negative for ear pain and sore throat.   Eyes:  Negative for pain and visual disturbance.  Respiratory:  Negative for cough and shortness of breath.   Cardiovascular:  Negative for chest pain and palpitations.  Gastrointestinal:  Negative for abdominal pain and vomiting.  Genitourinary:  Negative for dysuria and hematuria.  Musculoskeletal:  Negative for arthralgias and back pain.       Admits left arm pain, admits left leg pain, admits left rib pain  Skin:  Negative for  color change and rash.  Neurological:  Negative for seizures and syncope.  All other systems reviewed and are negative.   Updated Vital Signs BP (!) 147/81   Pulse 84   Temp 98.9 F (37.2 C) (Oral)   Resp 18   LMP  (LMP Unknown)   SpO2 94%   Physical Exam Vitals and nursing note reviewed.  Constitutional:      General: She is not in acute distress.    Appearance: Normal appearance. She is well-developed. She is not ill-appearing.  HENT:     Head: Normocephalic and atraumatic.  Eyes:     Conjunctiva/sclera: Conjunctivae normal.  Cardiovascular:     Rate and Rhythm: Normal rate and  regular rhythm.     Heart sounds: No murmur heard. Pulmonary:     Effort: Pulmonary effort is normal. No respiratory distress.     Breath sounds: Normal breath sounds.  Abdominal:     Palpations: Abdomen is soft.     Tenderness: There is no abdominal tenderness.  Musculoskeletal:        General: Tenderness present. No swelling.     Cervical back: Neck supple.     Comments: Left elbow and wrist and shoulder tenderness to palpation, left lateral rib tenderness to palpation, left hip and knee tenderness to palpation full range of motion of all joints, there is some swelling of the left wrist but otherwise patient is neurovascularly intact in both upper and lower extremities  Skin:    General: Skin is warm and dry.     Capillary Refill: Capillary refill takes less than 2 seconds.  Neurological:     Mental Status: She is alert.  Psychiatric:        Mood and Affect: Mood normal.     (all labs ordered are listed, but only abnormal results are displayed) Labs Reviewed - No data to display  EKG: None  Radiology: CT Chest Wo Contrast Result Date: 10/23/2023 CLINICAL DATA:  Fall landing on left side yesterday.  Left rib pain. EXAM: CT CHEST WITHOUT CONTRAST TECHNIQUE: Multidetector CT imaging of the chest was performed following the standard protocol without IV contrast. RADIATION DOSE REDUCTION: This exam was performed according to the departmental dose-optimization program which includes automated exposure control, adjustment of the mA and/or kV according to patient size and/or use of iterative reconstruction technique. COMPARISON:  11/29/2021 FINDINGS: Cardiovascular: Heart is normal size. Calcified plaque over the left main and 3 vessel coronary arteries. Thoracic aorta is normal in caliber. There is calcified plaque throughout the descending thoracic aorta. Remaining vascular structures are unremarkable. Mediastinum/Nodes: No significant mediastinal or hilar adenopathy. Remaining mediastinal  structures are unremarkable. Lungs/Pleura: Lungs are adequately inflated. Interval resolution of previously seen bilateral airspace process. Minimal peripheral subpleural reticulation in the lung bases. Mild bibasilar atelectatic change. No acute airspace process or effusion. Airways are unremarkable. Upper Abdomen: No acute findings. Calcified plaque over the abdominal aorta. Musculoskeletal: No acute rib fracture.  No focal abnormality. IMPRESSION: 1. No acute cardiopulmonary disease. 2. No acute left rib fracture. 3. Aortic atherosclerosis. Atherosclerotic coronary artery disease. Aortic Atherosclerosis (ICD10-I70.0). Electronically Signed   By: Toribio Agreste M.D.   On: 10/23/2023 15:29   DG Knee Complete 4 Views Left Result Date: 10/23/2023 EXAM: 4 VIEW(S) XRAY OF THE LEFT KNEE 10/23/2023 02:54:55 PM COMPARISON: None available. CLINICAL HISTORY: Fall, landed on left side yesterday, left shoulder, elbow, knee, wrist, rib pain. FINDINGS: BONES AND JOINTS: No acute fracture. No focal osseous lesion. No joint dislocation. No  knee joint effusion. Moderate lateral and patellofemoral compartment joint space narrowing with subchondral sclerosis and osteophyte formation. SOFT TISSUES: The soft tissues are unremarkable. IMPRESSION: 1. Degenerative change, without acute osseous abnormality. Electronically signed by: Rockey Kilts MD 10/23/2023 03:06 PM EDT RP Workstation: [id number]   DG Wrist Complete Left Result Date: 10/23/2023 CLINICAL DATA:  Fall EXAM: LEFT WRIST - COMPLETE 3+ VIEW COMPARISON:  None Available. FINDINGS: There is soft tissue swelling surrounding the wrist. The bones are diffusely osteopenic. There is abnormal widening of the scapholunate distance measuring 6 mm. There is inferior migration of the capitate. There is no definitive dislocation although the capitate appears anterior is sublux in relation to the lunate on the lateral view. No acute fractures are seen. There severe degenerative changes  of the first carpometacarpal joint with joint space narrowing and osteophyte formation. IMPRESSION: 1. No acute fracture. 2. Abnormal widening of the scapholunate distance with inferior migration of the capitate. Findings are concerning for scapholunate ligament injury. 3. Severe degenerative changes of the first carpometacarpal joint. Electronically Signed   By: Greig Pique M.D.   On: 10/23/2023 15:04   DG Hip Unilat W or Wo Pelvis 2-3 Views Left Result Date: 10/23/2023 EXAM: 2 or 3 VIEW(S) XRAY OF THE LEFT HIP 10/23/2023 02:54:55 PM COMPARISON: 04/13/2020 CLINICAL HISTORY: Fall, landed on left side yesterday, left shoulder, elbow, knee, wrist, rib pain. FINDINGS: BONES AND JOINTS: Left hip arthroplasty. No periprosthetic fracture or hardware complication about the hip. Right femoral head is located. Sacroiliac joints are symmetric. SOFT TISSUES: The soft tissues are unremarkable. IMPRESSION: 1. No acute fracture or dislocation. 2. Left hip arthroplasty without periprosthetic fracture or hardware complication. Electronically signed by: Rockey Kilts MD 10/23/2023 03:04 PM EDT RP Workstation: [id number]   DG Ribs Unilateral W/Chest Left Result Date: 10/23/2023 EXAM: 3 VIEW(S) XRAY OF THE LEFT RIBS AND CHEST 10/23/2023 02:54:55 PM COMPARISON: 12/01/2021 chest radiograph. CLINICAL HISTORY: Fall, landed on left side yesterday, left shoulder, elbow, knee, wrist, rib pain. FINDINGS: BONES: No displaced rib fracture. LUNGS AND PLEURA: Bibasilar volume loss with scarring and/or subsegmental atelectasis. No consolidation or pulmonary edema. No pleural effusion or pneumothorax. HEART AND MEDIASTINUM: Mild cardiomegaly. Atherosclerosis in the transverse aorta. No acute abnormality of the cardiac and mediastinal silhouettes. IMPRESSION: 1. No acute findings. Electronically signed by: Rockey Kilts MD 10/23/2023 03:02 PM EDT RP Workstation: [id number]   DG Elbow Complete Left Result Date: 10/23/2023 EXAM: 3 VIEW(S) XRAY  OF THE LEFT ELBOW COMPARISON: None available. CLINICAL HISTORY: Fall, landed on left side yesterday, left shoulder, elbow, knee, wrist, rib pain. FINDINGS: BONES AND JOINTS: No acute fracture. No focal osseous lesion. No joint dislocation. No joint effusion. SOFT TISSUES: The soft tissues are unremarkable. IMPRESSION: 1. No acute abnormality. Electronically signed by: Rockey Kilts MD 10/23/2023 03:00 PM EDT RP Workstation: [id number]   DG Shoulder Left Result Date: 10/23/2023 EXAM: 1 VIEW XRAY OF THE LEFT SHOULDER 10/23/2023 02:54:55 PM COMPARISON: 10/22/17 CLINICAL HISTORY: Fall, landed on left side yesterday, left shoulder, elbow, knee, wrist, rib pain. FINDINGS: BONES AND JOINTS: Glenohumeral joint is normally aligned. No acute fracture or dislocation. Degenerative irregularity of the acromioclavicular joint. SOFT TISSUES: No abnormal calcifications. Visualized lung is unremarkable. IMPRESSION: 1. No acute fracture or dislocation. Electronically signed by: Rockey Kilts MD 10/23/2023 02:59 PM EDT RP Workstation: [id number]     Procedures   Medications Ordered in the ED - No data to display  Medical Decision Making Social determinants of health: Lives alone, extremes of age  Patient here for pain after a fall.  All imaging is negative.  She declined any pain meds in the ER.  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  Recommended wrist splint as needed for pain and follow-up outpatient.  Patient is agreeable with the plan for discharge.  Advised Tylenol  Motrin as needed.  Problems Addressed: Acute pain of left knee: acute illness or injury Acute pain of left shoulder: acute illness or injury Fall, initial encounter: acute illness or injury Left hip pain: acute illness or injury Rib pain on left side: acute illness or injury Sprain of left wrist, initial encounter: acute illness or  injury  Amount and/or Complexity of Data Reviewed External Data Reviewed: notes.    Details: Outpatient records reviewed and patient recently treated outpatient for cystitis Radiology: ordered and independent interpretation performed. Decision-making details documented in ED Course.    Details: Imaging ordered and interpreted independently radiology Left knee x-ray: Shows no acute bony fracture or abnormality Left hip x-ray: Shows no acute bony abnormality or fracture Left wrist x-ray: Shows evidence of possible scapholunate disassociation Left elbow x-ray: Shows no acute fracture or bony abnormality Left rib x-ray: Shows no acute bony fracture or abnormality Left shoulder x-ray: Shows no acute bony fracture or abnormality CT chest: Shows no acute fracture or abnormality Discussion of management or test interpretation with external provider(s): Dr. Genelle - orthopedic surgery -I discussed patient's case with him.  He recommended outpatient follow-up and respond as needed for pain.  Thinks the disassociated is likely chronic  Risk OTC drugs. Prescription drug management. Diagnosis or treatment significantly limited by social determinants of health.     Final diagnoses:  Fall, initial encounter  Sprain of left wrist, initial encounter  Acute pain of left shoulder  Rib pain on left side  Left hip pain  Acute pain of left knee    ED Discharge Orders     None          Gennaro Duwaine CROME, DO 10/23/23 1600

## 2023-11-03 ENCOUNTER — Ambulatory Visit (HOSPITAL_BASED_OUTPATIENT_CLINIC_OR_DEPARTMENT_OTHER): Admitting: Physician Assistant

## 2023-11-03 ENCOUNTER — Telehealth: Payer: Self-pay

## 2023-11-03 ENCOUNTER — Encounter (HOSPITAL_BASED_OUTPATIENT_CLINIC_OR_DEPARTMENT_OTHER): Payer: Self-pay | Admitting: Physician Assistant

## 2023-11-03 DIAGNOSIS — M25532 Pain in left wrist: Secondary | ICD-10-CM | POA: Insufficient documentation

## 2023-11-03 NOTE — Telephone Encounter (Signed)
 LMOM to have her schedule with Ash next week

## 2023-11-03 NOTE — Progress Notes (Signed)
 Office Visit Note   Patient: Misty Cervantes           Date of Birth: [birth year]           MRN: [id number] Visit Date: 11/03/2023              Requested by: Lucius Krabbe, NP [address] [place],  [place] [address] PCP: Lucius Krabbe, NP   Assessment & Plan: Visit Diagnoses:  1. Pain in left wrist     Plan: Patient is a pleasant 78 year old woman who was moving things in a storage unit and fell.  She was thoroughly evaluated by the emergency room this was 11 days ago she did complain of some left wrist pain.  She is right-hand dominant.  X-rays there demonstrated scaphoid lunate dissociation.  She is wearing a removable splint today.  She has some pain but has good extension and flexion of her wrist.  She is neuro logically intact.  She does have quite a bit of arthritis in her thumb.  She will remain in the splint I will send a message to Dr. Colman.  I will have him review the x-rays she does have some arthritis in her wrist but will continue to treat this conservatively pending his opinion  Follow-Up Instructions: Return in about 2 weeks (around 11/17/2023).   Orders:  No orders of the defined types were placed in this encounter.  No orders of the defined types were placed in this encounter.     Procedures: No procedures performed   Clinical Data: No additional findings.   Subjective: Chief Complaint  Patient presents with   Left Wrist - Pain    HPI pleasant 78 year old woman 11 days status post falling while moving things in her storage unit comes in today for follow-up on her wrist she was seen evaluated in the emergency room she does not have a lot of pain she is right-hand dominant pain is mostly focal over the dorsal wrist  Review of Systems  All other systems reviewed and are negative.    Objective: Vital Signs: LMP  (LMP Unknown)   Physical Exam Constitutional:      Appearance: Normal appearance.  Pulmonary:     Effort: Pulmonary  effort is normal.  Skin:    General: Skin is warm and dry.  Neurological:     General: No focal deficit present.     Mental Status: She is alert and oriented to person, place, and time.  Psychiatric:        Mood and Affect: Mood normal.        Behavior: Behavior normal.     Ortho Exam Examination of her left hand she has a strong radial pulse some tenderness over the mid dorsal part of her wrist.  She has good active extension and flexion brisk capillary refill sensation is intact Specialty Comments:  No specialty comments available.  Imaging: No results found.   PMFS History: Patient Active Problem List   Diagnosis Date Noted   Pain in left wrist 11/03/2023   Primary insomnia 12/07/2021   Pneumonia of both lower lobes due to infectious organism    Chronic kidney disease    Spinal stenosis of lumbar region 05/13/2020   Aortic atherosclerosis (HCC) 06/22/2019   Type 2 diabetes mellitus with hyperglycemia, without long-term current use of insulin  (HCC) 06/18/2016   OA (osteoarthritis) of knee 11/03/2015   Hyperlipidemia associated with type 2 diabetes mellitus (HCC) 07/23/2014   Genu valgum 08/11/2012  Hypertension associated with diabetes (HCC) 02/25/2010   Past Medical History:  Diagnosis Date   Abdominal pain 08/23/2021   Chronic kidney disease (CKD), stage III (moderate) (HCC)    followed by pcp   Dysphagia 01/22/2021   Full dentures    HCAP (healthcare-associated pneumonia) 08/22/2021   Headache 08/23/2021   History of 2019 novel coronavirus disease (COVID-19) 04/05/2019   result in care everywhere ,  per pt mild symptoms that resolved   History of gastric ulcer 2013   non-bleeding   Hyperlipemia    Hypokalemia    Hyponatremia 08/23/2021   Low serum potassium level 01/29/2021   Mixed stress and urge incontinence    Osteoarthritis    Presence of pessary    Respiratory distress 11/28/2021   Sepsis (HCC) 11/28/2021   Type 2 diabetes mellitus (HCC)     followed by pcp   (08-26-2020 per pt checks blood sugar twice weekly, unsure what fasting sugar is)   UTI (urinary tract infection) 08/23/2021   Vaginal vault 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    gyn--- dr jannis, using pessary   Wears glasses     Family History  Problem Relation Age of Onset   Diabetes Mother        deceased   Heart attack Mother    Diabetes Sister        x2   Kidney cancer Father    Heart attack Father    Diabetes Sister     Past Surgical History:  Procedure Laterality Date   CARPAL TUNNEL RELEASE Right    1980s   CATARACT EXTRACTION W/ INTRAOCULAR LENS IMPLANT Bilateral 2013   approx   COLONOSCOPY WITH ESOPHAGOGASTRODUODENOSCOPY (EGD)  11/17/2011   COLPOCLEISIS N/A 09/02/2020   Procedure: COLPOCLEISIS, PERINEORRHAPHY;  Surgeon: jannis Kate Norris, MD;  Location: [facility];  Service: Gynecology;  Laterality: N/A;   CYSTOSCOPY N/A 09/02/2020   Procedure: CYSTOSCOPY;  Surgeon: jannis Kate Norris, MD;  Location: [facility];  Service: Gynecology;  Laterality: N/A;   RETINAL DETACHMENT SURGERY Right 2003   TOTAL HIP ARTHROPLASTY Left 03/02/2010   @[facility]    TOTAL KNEE ARTHROPLASTY Right 11/03/2015   Procedure: RIGHT TOTAL KNEE ARTHROPLASTY;  Surgeon: Dempsey Moan, MD;  Location: [facility];  Service: Orthopedics;  Laterality: Right;   TOTAL VAGINAL HYSTERECTOMY  2012   TVH, anterior repair, TVT (no mesh used for the anterior repair)   Social History   Occupational History   Occupation: retired    Associate Professor: RETIRED  Tobacco Use   Smoking status: Never   Smokeless tobacco: Never  Vaping Use   Vaping status: Never Used  Substance and Sexual Activity   Alcohol use: No   Drug use: No   Sexual activity: Not Currently    Partners: Male    Birth control/protection: Surgical    Comment: hysterectomy

## 2023-11-24 ENCOUNTER — Encounter (HOSPITAL_BASED_OUTPATIENT_CLINIC_OR_DEPARTMENT_OTHER): Payer: Self-pay

## 2023-11-24 ENCOUNTER — Ambulatory Visit (HOSPITAL_BASED_OUTPATIENT_CLINIC_OR_DEPARTMENT_OTHER): Admitting: Student

## 2023-11-28 ENCOUNTER — Ambulatory Visit: Admitting: Orthopedic Surgery

## 2023-12-13 ENCOUNTER — Ambulatory Visit (INDEPENDENT_AMBULATORY_CARE_PROVIDER_SITE_OTHER): Admitting: Orthopedic Surgery

## 2023-12-13 ENCOUNTER — Other Ambulatory Visit: Payer: Self-pay

## 2023-12-13 DIAGNOSIS — M25532 Pain in left wrist: Secondary | ICD-10-CM | POA: Diagnosis not present

## 2023-12-13 DIAGNOSIS — M19132 Post-traumatic osteoarthritis, left wrist: Secondary | ICD-10-CM | POA: Diagnosis not present

## 2023-12-13 NOTE — Progress Notes (Signed)
 Misty Cervantes - 78 y.o. female MRN [id number]  Date of birth: 05-29-1945  Office Visit Note: Visit Date: 12/13/2023 PCP: Lucius Krabbe, NP Referred by: Lucius Krabbe, NP  Subjective: No chief complaint on file.  HPI: Misty Cervantes is a pleasant 78 y.o. female who presents today for ongoing left wrist arthritis.  She has associated swelling and discomfort at the left wrist.  She has been sent to myself today for specific hand surgical evaluation.  She did have a recent fall onto the left wrist which may have exacerbated her symptoms, states that her pain is relatively well-controlled on examination today.  Does have some ongoing deformity and swelling at the left wrist, however this is manageable with over-the-counter medication and activity modification.  She does utilize a wrist brace currently with moderate relief.  Pertinent ROS were reviewed with the patient and found to be negative unless otherwise specified above in HPI.   Visit Reason: Left wrist SLAC arthritis Duration of symptoms: since 10/23/23 Hand dominance: right Occupation: retired Diabetic: Yes- 9.5 Smoking: No Heart/Lung History: HTN Blood Thinners: aspirin   Prior Testing/EMG: x-rays Injections (Date): none Treatments: bracing Prior Surgery: none  Assessment & Plan: Visit Diagnoses:  1. Slac (scapholunate advanced collapse) of wrist, left   2. Pain in left wrist     Plan: Extensive discussion was had with the patient today regarding her left wrist scapholunate advanced collapse arthritis.  I reviewed her x-rays in detail with her today which do show scapholunate dissociation and notable degeneration consistent with end-stage SLAC arthritic changes.  However, given that her symptoms are manageable with conservative measures, I would not recommend any invasive surgical treatment at this time.  I did explain to her that salvage procedures can be available to her in the future including proximal row  carpectomy versus potential for corner fusion, possible wrist arthrodesis versus arthroplasty could also be considered.  She expressed understanding, will return as needed.  Follow-up: No follow-ups on file.   Meds & Orders: No orders of the defined types were placed in this encounter.   Orders Placed This Encounter  Procedures   XR Wrist Complete Left     Procedures: No procedures performed      Clinical History: No specialty comments available.  She reports that she has never smoked. She has never used smokeless tobacco.  Recent Labs    07/18/23 1145  HGBA1C 9.5*    Objective:   Vital Signs: LMP  (LMP Unknown)   Physical Exam  Gen: Well-appearing, in no acute distress; non-toxic CV: Regular Rate. Well-perfused. Warm.  Resp: Breathing unlabored on room air; no wheezing. Psych: Fluid speech in conversation; appropriate affect; normal thought process  Ortho Exam Left wrist: - Notable swelling deformity is seen to the left wrist, pain with deep palpation at the radiocarpal region - Wrist range of motion flexion/extension 25/15, near composite fist without significant restriction - Z deformity is appreciated to the left thumb, notable tenderness at the [facility] region with associated MP hyperextension  Imaging: No results found. Prior x-rays of the left wrist were reviewed in detail independently by myself today  Past Medical/Family/Surgical/Social History: Medications & Allergies reviewed per EMR, new medications updated. Patient Active Problem List   Diagnosis Date Noted   Pain in left wrist 11/03/2023   Primary insomnia 12/07/2021   Pneumonia of both lower lobes due to infectious organism    Chronic kidney disease    Spinal stenosis of lumbar region 05/13/2020   Aortic  atherosclerosis 06/22/2019   Type 2 diabetes mellitus with hyperglycemia, without long-term current use of insulin  (HCC) 06/18/2016   OA (osteoarthritis) of knee 11/03/2015   Hyperlipidemia associated  with type 2 diabetes mellitus (HCC) 07/23/2014   Genu valgum 08/11/2012   Hypertension associated with diabetes (HCC) 02/25/2010   Past Medical History:  Diagnosis Date   Abdominal pain 08/23/2021   Chronic kidney disease (CKD), stage III (moderate) (HCC)    followed by pcp   Dysphagia 01/22/2021   Full dentures    HCAP (healthcare-associated pneumonia) 08/22/2021   Headache 08/23/2021   History of 2019 novel coronavirus disease (COVID-19) 04/05/2019   result in care everywhere ,  per pt mild symptoms that resolved   History of gastric ulcer 2013   non-bleeding   Hyperlipemia    Hypokalemia    Hyponatremia 08/23/2021   Low serum potassium level 01/29/2021   Mixed stress and urge incontinence    Osteoarthritis    Presence of pessary    Respiratory distress 11/28/2021   Sepsis (HCC) 11/28/2021   Type 2 diabetes mellitus (HCC)    followed by pcp   (08-26-2020 per pt checks blood sugar twice weekly, unsure what fasting sugar is)   UTI (urinary tract infection) 08/23/2021   Vaginal vault 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    gyn--- dr jannis, using pessary   Wears glasses    Family History  Problem Relation Age of Onset   Diabetes Mother        deceased   Heart attack Mother    Diabetes Sister        x2   Kidney cancer Father    Heart attack Father    Diabetes Sister    Past Surgical History:  Procedure Laterality Date   CARPAL TUNNEL RELEASE Right    1980s   CATARACT EXTRACTION W/ INTRAOCULAR LENS IMPLANT Bilateral 2013   approx   COLONOSCOPY WITH ESOPHAGOGASTRODUODENOSCOPY (EGD)  11/17/2011   COLPOCLEISIS N/A 09/02/2020   Procedure: COLPOCLEISIS, PERINEORRHAPHY;  Surgeon: jannis Kate Norris, MD;  Location: [facility];  Service: Gynecology;  Laterality: N/A;   CYSTOSCOPY N/A 09/02/2020   Procedure: CYSTOSCOPY;  Surgeon: jannis Kate Norris, MD;  Location: [facility];  Service: Gynecology;  Laterality: N/A;   RETINAL DETACHMENT SURGERY  Right 2003   TOTAL HIP ARTHROPLASTY Left 03/02/2010   @[facility]    TOTAL KNEE ARTHROPLASTY Right 11/03/2015   Procedure: RIGHT TOTAL KNEE ARTHROPLASTY;  Surgeon: Dempsey Moan, MD;  Location: [facility];  Service: Orthopedics;  Laterality: Right;   TOTAL VAGINAL HYSTERECTOMY  2012   TVH, anterior repair, TVT (no mesh used for the anterior repair)   Social History   Occupational History   Occupation: retired    Associate Professor: RETIRED  Tobacco Use   Smoking status: Never   Smokeless tobacco: Never  Vaping Use   Vaping status: Never Used  Substance and Sexual Activity   Alcohol use: No   Drug use: No   Sexual activity: Not Currently    Partners: Male    Birth control/protection: Surgical    Comment: hysterectomy    Kendle Erker Estela) Magalie Almon, M.D. [facility], Hand Surgery

## 2024-01-12 ENCOUNTER — Other Ambulatory Visit: Payer: Self-pay | Admitting: Family

## 2024-01-12 DIAGNOSIS — E1165 Type 2 diabetes mellitus with hyperglycemia: Secondary | ICD-10-CM

## 2024-01-16 ENCOUNTER — Encounter: Payer: Self-pay | Admitting: Radiology

## 2024-01-23 ENCOUNTER — Other Ambulatory Visit: Payer: Self-pay | Admitting: Family

## 2024-01-23 DIAGNOSIS — E1165 Type 2 diabetes mellitus with hyperglycemia: Secondary | ICD-10-CM

## 2024-02-04 ENCOUNTER — Other Ambulatory Visit: Payer: Self-pay | Admitting: Family

## 2024-02-04 DIAGNOSIS — E1165 Type 2 diabetes mellitus with hyperglycemia: Secondary | ICD-10-CM

## 2024-02-08 ENCOUNTER — Other Ambulatory Visit: Payer: Self-pay | Admitting: Family

## 2024-02-08 DIAGNOSIS — E1165 Type 2 diabetes mellitus with hyperglycemia: Secondary | ICD-10-CM

## 2024-02-28 ENCOUNTER — Encounter: Payer: Self-pay | Admitting: Family

## 2024-02-28 ENCOUNTER — Other Ambulatory Visit: Payer: Self-pay | Admitting: Family

## 2024-02-28 ENCOUNTER — Ambulatory Visit: Admitting: Family

## 2024-02-28 VITALS — BP 140/80 | HR 69 | Temp 97.7°F | Ht 59.0 in | Wt 146.5 lb

## 2024-02-28 DIAGNOSIS — E785 Hyperlipidemia, unspecified: Secondary | ICD-10-CM

## 2024-02-28 DIAGNOSIS — E1165 Type 2 diabetes mellitus with hyperglycemia: Secondary | ICD-10-CM | POA: Diagnosis not present

## 2024-02-28 DIAGNOSIS — R300 Dysuria: Secondary | ICD-10-CM

## 2024-02-28 DIAGNOSIS — Z7984 Long term (current) use of oral hypoglycemic drugs: Secondary | ICD-10-CM | POA: Diagnosis not present

## 2024-02-28 DIAGNOSIS — I152 Hypertension secondary to endocrine disorders: Secondary | ICD-10-CM

## 2024-02-28 DIAGNOSIS — F5101 Primary insomnia: Secondary | ICD-10-CM

## 2024-02-28 DIAGNOSIS — R350 Frequency of micturition: Secondary | ICD-10-CM

## 2024-02-28 DIAGNOSIS — E1169 Type 2 diabetes mellitus with other specified complication: Secondary | ICD-10-CM | POA: Diagnosis not present

## 2024-02-28 DIAGNOSIS — E1159 Type 2 diabetes mellitus with other circulatory complications: Secondary | ICD-10-CM | POA: Diagnosis not present

## 2024-02-28 LAB — POCT URINALYSIS DIPSTICK
Bilirubin, UA: NEGATIVE
Blood, UA: NEGATIVE
Glucose, UA: POSITIVE — AB
Ketones, UA: NEGATIVE
Leukocytes, UA: NEGATIVE
Nitrite, UA: NEGATIVE
Protein, UA: NEGATIVE
Spec Grav, UA: >=1.03 — AB (ref 1.010–1.025)
Urobilinogen, UA: 0.2 U/dL
pH, UA: 6 (ref 5.0–8.0)

## 2024-02-28 LAB — COMPREHENSIVE METABOLIC PANEL WITH GFR
ALT: 11 U/L (ref 3–35)
AST: 15 U/L (ref 5–37)
Albumin: 4 g/dL (ref 3.5–5.2)
Alkaline Phosphatase: 77 U/L (ref 39–117)
BUN: 15 mg/dL (ref 6–23)
CO2: 25 meq/L (ref 19–32)
Calcium: 9.2 mg/dL (ref 8.4–10.5)
Chloride: 100 meq/L (ref 96–112)
Creatinine, Ser: 1.12 mg/dL (ref 0.40–1.20)
GFR: 46.88 mL/min — ABNORMAL LOW (ref >60.00)
Glucose, Bld: 317 mg/dL — ABNORMAL HIGH (ref 70–99)
Potassium: 4 meq/L (ref 3.5–5.1)
Sodium: 134 meq/L — ABNORMAL LOW (ref 135–145)
Total Bilirubin: 0.6 mg/dL (ref 0.2–1.2)
Total Protein: 6.4 g/dL (ref 6.0–8.3)

## 2024-02-28 LAB — MICROALBUMIN / CREATININE URINE RATIO
Creatinine,U: 125.5 mg/dL
Microalb Creat Ratio: 40.7 mg/g — ABNORMAL HIGH (ref 0.0–30.0)
Microalb, Ur: 5.1 mg/dL — ABNORMAL HIGH (ref 0.7–1.9)

## 2024-02-28 LAB — HEMOGLOBIN A1C: Hgb A1c MFr Bld: 10.7 % — ABNORMAL HIGH (ref 4.6–6.5)

## 2024-02-28 MED ORDER — LOSARTAN POTASSIUM 50 MG PO TABS: 50.0000 mg | ORAL_TABLET | Freq: Every day | ORAL | 1 refills | Status: AC

## 2024-02-28 MED ORDER — METFORMIN HCL ER 500 MG PO TB24: ORAL_TABLET | ORAL | 1 refills | Status: AC

## 2024-02-28 MED ORDER — PRAVASTATIN SODIUM 40 MG PO TABS: ORAL_TABLET | ORAL | 1 refills | Status: AC

## 2024-02-28 MED ORDER — GLIPIZIDE ER 10 MG PO TB24: 10.0000 mg | ORAL_TABLET | Freq: Two times a day (BID) | ORAL | 1 refills | Status: AC

## 2024-02-28 NOTE — Patient Instructions (Addendum)
 It was very nice to see you today!   I will review your lab results via MyChart or we will all you in a few days.  I have sent in your refills. Take 1/2 pill of the Jardiance  samples once a week, this can help protect your kidneys and lower your blood sugar. Keep taking the Metformin  once a day and the Glipizide  twice a day. Keep taking the Losartan  daily for your blood pressure. Keep taking the cholesterol pill, Pravastatin  daily.  Be sure you are taking your meds every day! Drink at least 2 liters of water every day!   Schedule a follow up visit in 3 months!      PLEASE NOTE:  If you had any lab tests please let us  know if you have not heard back within a few days. You may see your results on MyChart before we have a chance to review them but we will give you a call once they are reviewed by us . If we ordered any referrals today, please let us  know if you have not heard from their office within the next week.

## 2024-02-28 NOTE — Progress Notes (Unsigned)
 Patient ID: Misty Cervantes, female    DOB: May 22, 1945, 78 y.o.   MRN: [id number]  Chief Complaint  Patient presents with   Type 2 diabetes mellitus    Follow up   Hypertension associated with diabetes    Subjective:    Outpatient Medications Prior to Visit  Medication Sig Dispense Refill   ACCU-CHEK GUIDE test strip USE 1 STRIP TWICE A DAY 100 strip 2   Accu-Chek Softclix Lancets lancets CHECK BLOOD SUGARS 1-2 TIMES PER DAY. 100 each 2   acetaminophen  (TYLENOL ) 500 MG tablet Take 500-1,000 mg by mouth 2 (two) times daily as needed for headache or mild pain.     ALAVERT  10 MG dissolvable tablet Take 10 mg by mouth daily as needed for allergies.     aspirin  EC 81 MG tablet Take 81 mg by mouth daily. Swallow whole.     Blood Glucose Monitoring Suppl (ACCU-CHEK GUIDE) w/Device KIT Use as needed 1 kit 0   Cyanocobalamin  (B-12 PO) Take 1 tablet by mouth daily.     fluticasone  (FLONASE ) 50 MCG/ACT nasal spray SPRAY 1 SPRAY INTO EACH NOSTRIL TWICE A DAY AS NEEDED FOR ALLERGIES OR RHINITIS 48 mL 0   glipiZIDE  (GLUCOTROL  XL) 10 MG 24 hr tablet TAKE 1 TABLET (10 MG TOTAL) BY MOUTH 2 (TWO) TIMES DAILY AFTER A MEAL. 180 tablet 1   Multiple Vitamin (MULTIVITAMIN WITH MINERALS) TABS tablet Take 1 tablet by mouth daily with breakfast.     traZODone  (DESYREL ) 50 MG tablet Take 0.5-1 tablets (25-50 mg total) by mouth at bedtime as needed. for sleep 90 tablet 1   empagliflozin  (JARDIANCE ) 10 MG TABS tablet Take 1 tablet (10 mg total) by mouth daily before breakfast. (Patient not taking: Reported on 02/28/2024) 90 tablet 1   glipiZIDE  (GLUCOTROL  XL) 5 MG 24 hr tablet TAKE 1 TABLET (5 MG TOTAL) BY MOUTH 2 (TWO) TIMES DAILY AFTER A MEAL. 60 tablet 0   losartan  (COZAAR ) 50 MG tablet Take 1 tablet (50 mg total) by mouth daily. 90 tablet 1   metFORMIN  (GLUCOPHAGE -XR) 500 MG 24 hr tablet TAKE 1 TABLET BY MOUTH EVERY DAY WITH BREAKFAST 60 tablet 0   phentermine (ADIPEX-P) 37.5 MG tablet Take  37.5 mg by mouth 2 (two) times daily. (Patient not taking: Reported on 02/28/2024)     pravastatin  (PRAVACHOL ) 40 MG tablet TAKE 1 TABLET BY MOUTH EVERYDAY AT BEDTIME 90 tablet 1   No facility-administered medications prior to visit.   Past Medical History:  Diagnosis Date   Abdominal pain 08/23/2021   Chronic kidney disease (CKD), stage III (moderate) (HCC)    followed by pcp   Dysphagia 01/22/2021   Full dentures    HCAP (healthcare-associated pneumonia) 08/22/2021   Headache 08/23/2021   History of 2019 novel coronavirus disease (COVID-19) 04/05/2019   result in care everywhere ,  per pt mild symptoms that resolved   History of gastric ulcer 2013   non-bleeding   Hyperlipemia    Hypokalemia    Hyponatremia 08/23/2021   Low serum potassium level 01/29/2021   Mixed stress and urge incontinence    Osteoarthritis    Presence of pessary    Respiratory distress 11/28/2021   Sepsis (HCC) 11/28/2021   Type 2 diabetes mellitus (HCC)    followed by pcp   (08-26-2020 per pt checks blood sugar twice weekly, unsure what fasting sugar is)   UTI (urinary tract infection) 08/23/2021   Vaginal vault 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    gyn--- dr  jertson, using pessary   Wears glasses    Past Surgical History:  Procedure Laterality Date   CARPAL TUNNEL RELEASE Right    1980s   CATARACT EXTRACTION W/ INTRAOCULAR LENS IMPLANT Bilateral 2013   approx   COLONOSCOPY WITH ESOPHAGOGASTRODUODENOSCOPY (EGD)  11/17/2011   COLPOCLEISIS N/A 09/02/2020   Procedure: COLPOCLEISIS, PERINEORRHAPHY;  Surgeon: Jannis Kate Norris, MD;  Location: [facility];  Service: Gynecology;  Laterality: N/A;   CYSTOSCOPY N/A 09/02/2020   Procedure: CYSTOSCOPY;  Surgeon: Jannis Kate Norris, MD;  Location: [facility];  Service: Gynecology;  Laterality: N/A;   RETINAL DETACHMENT SURGERY Right 2003   TOTAL HIP ARTHROPLASTY Left 03/02/2010   @[facility]    TOTAL KNEE  ARTHROPLASTY Right 11/03/2015   Procedure: RIGHT TOTAL KNEE ARTHROPLASTY;  Surgeon: Dempsey Moan, MD;  Location: [facility];  Service: Orthopedics;  Laterality: Right;   TOTAL VAGINAL HYSTERECTOMY  2012   TVH, anterior repair, TVT (no mesh used for the anterior repair)   Allergies[1]    Objective:    Physical Exam Vitals and nursing note reviewed.  Constitutional:      Appearance: Normal appearance.  Cardiovascular:     Rate and Rhythm: Normal rate and regular rhythm.  Pulmonary:     Effort: Pulmonary effort is normal.     Breath sounds: Normal breath sounds.  Musculoskeletal:        General: Normal range of motion.  Skin:    General: Skin is warm and dry.  Neurological:     Mental Status: She is alert.  Psychiatric:        Mood and Affect: Mood normal.        Behavior: Behavior normal.    BP (!) 160/92 (BP Location: Left Arm, Patient Position: Sitting, Cuff Size: Large)   Pulse 69   Temp 97.7 F (36.5 C) (Temporal)   Ht 4' 11 (1.499 m)   Wt 146 lb 8 oz (66.5 kg)   LMP  (LMP Unknown)   SpO2 96%   BMI 29.59 kg/m  Wt Readings from Last 3 Encounters:  02/28/24 146 lb 8 oz (66.5 kg)  09/27/23 150 lb 12.8 oz (68.4 kg)  07/18/23 151 lb 8 oz (68.7 kg)      Lucius Krabbe, NP       [1] Allergies Allergen Reactions   Bacid    Latex Other (See Comments)    Exact reaction not cited   Norvasc  [Amlodipine ] Swelling

## 2024-03-06 ENCOUNTER — Ambulatory Visit: Payer: Self-pay | Admitting: Family

## 2024-03-06 DIAGNOSIS — E1165 Type 2 diabetes mellitus with hyperglycemia: Secondary | ICD-10-CM

## 2024-03-06 NOTE — Progress Notes (Signed)
 A1C back up to 10.7, glucose at 317, both are way too high!  And she has protein in her urine and along with her kidney disease it can worsen quickly and she will be on dialysis!  She has to get her blood sugar down. Remind her to take 10mg  of the Glipizide  TWICE a day and the Metformin  once a day. She needs to find a way to pay for the Jardiance  to take every day to help her blood sugar & kidneys. She will have to start insulin  as no other meds are available & safe for her kidneys.  Be sure she has a 3 month follow up visit scheduled and we will recheck A1C.  Let her know I also have ordered a VBCI referral to help with options in affording Jardiance  or insulin . thx

## 2024-03-13 ENCOUNTER — Telehealth: Payer: Self-pay | Admitting: *Deleted

## 2024-03-13 NOTE — Progress Notes (Unsigned)
 Care Guide Pharmacy Note  03/13/2024 Name: Misty Cervantes MRN: [id number] DOB: 1945-08-21  Referred By: Lucius Krabbe, NP Reason for referral: Call Attempt #1 and Complex Care Management (Outreach to schedule referral with pharmacist )   Misty Cervantes is a 78 y.o. year old female who is a primary care patient of Lucius Krabbe, NP.  [name] was referred to the pharmacist for assistance related to: DMII  An unsuccessful telephone outreach was attempted today to contact the patient who was referred to the pharmacy team for assistance with medication assistance. Additional attempts will be made to contact the patient.  Thedford Franks, CMA [place]  [facility], [facility] Guide Direct Dial: [phone]  Fax: [phone] Website: [email or web address]

## 2024-03-14 NOTE — Progress Notes (Unsigned)
 Care Guide Pharmacy Note  03/14/2024 Name: Misty Cervantes MRN: [id number] DOB: Jan 15, 1946  Referred By: Lucius Krabbe, NP Reason for referral: Call Attempt #1 and Complex Care Management (Outreach to schedule referral with pharmacist )   Misty Cervantes is a 78 y.o. year old female who is a primary care patient of Lucius Krabbe, NP.  Misty Cervantes was referred to the pharmacist for assistance related to: DMII  A second unsuccessful telephone outreach was attempted today to contact the patient who was referred to the pharmacy team for assistance with medication assistance. Additional attempts will be made to contact the patient.  Thedford Franks, CMA [place]  [facility], [facility] Guide Direct Dial: [phone]  Fax: [phone] Website: [email or web address]

## 2024-03-16 NOTE — Progress Notes (Signed)
 Care Guide Pharmacy Note  03/16/2024 Name: SHAKITA KEIR MRN: [id number] DOB: November 22, 1945  Referred By: Lucius Krabbe, NP Reason for referral: Call Attempt #1 and Complex Care Management (Outreach to schedule referral with pharmacist )   MEILAH DELROSARIO is a 79 y.o. year old female who is a primary care patient of Lucius Krabbe, NP.  Rock CHRISTELLA Meissner was referred to the pharmacist for assistance related to: DMII  A third unsuccessful telephone outreach was attempted today to contact the patient who was referred to the pharmacy team for assistance with medication assistance. The Population Health team is pleased to engage with this patient at any time in the future upon receipt of referral and should he/she be interested in assistance from the Population Health team.  Thedford Franks, CMA [facility]  [facility], [facility] Guide Direct Dial: [phone]  Fax: [phone] Website: [email or web address]

## 2024-03-18 ENCOUNTER — Encounter (HOSPITAL_COMMUNITY): Payer: Self-pay | Admitting: Emergency Medicine

## 2024-03-18 ENCOUNTER — Emergency Department (HOSPITAL_COMMUNITY)

## 2024-03-18 ENCOUNTER — Inpatient Hospital Stay (HOSPITAL_COMMUNITY)
Admission: EM | Admit: 2024-03-18 | Discharge: 2024-03-31 | DRG: 853 | Disposition: A | Discharge status: Home / Self Care | Attending: Internal Medicine | Admitting: Internal Medicine

## 2024-03-18 ENCOUNTER — Other Ambulatory Visit: Payer: Self-pay

## 2024-03-18 DIAGNOSIS — Z8249 Family history of ischemic heart disease and other diseases of the circulatory system: Secondary | ICD-10-CM

## 2024-03-18 DIAGNOSIS — E785 Hyperlipidemia, unspecified: Secondary | ICD-10-CM

## 2024-03-18 DIAGNOSIS — C182 Malignant neoplasm of ascending colon: Secondary | ICD-10-CM | POA: Diagnosis present

## 2024-03-18 DIAGNOSIS — R9431 Abnormal electrocardiogram [ECG] [EKG]: Secondary | ICD-10-CM

## 2024-03-18 DIAGNOSIS — Z1611 Resistance to penicillins: Secondary | ICD-10-CM | POA: Diagnosis present

## 2024-03-18 DIAGNOSIS — I4719 Other supraventricular tachycardia: Secondary | ICD-10-CM | POA: Diagnosis present

## 2024-03-18 DIAGNOSIS — C19 Malignant neoplasm of rectosigmoid junction: Secondary | ICD-10-CM | POA: Diagnosis present

## 2024-03-18 DIAGNOSIS — C180 Malignant neoplasm of cecum: Secondary | ICD-10-CM | POA: Diagnosis present

## 2024-03-18 DIAGNOSIS — Z8616 Personal history of COVID-19: Secondary | ICD-10-CM

## 2024-03-18 DIAGNOSIS — E1169 Type 2 diabetes mellitus with other specified complication: Secondary | ICD-10-CM | POA: Diagnosis present

## 2024-03-18 DIAGNOSIS — E860 Dehydration: Secondary | ICD-10-CM | POA: Diagnosis present

## 2024-03-18 DIAGNOSIS — R6520 Severe sepsis without septic shock: Secondary | ICD-10-CM | POA: Diagnosis present

## 2024-03-18 DIAGNOSIS — Z96642 Presence of left artificial hip joint: Secondary | ICD-10-CM | POA: Diagnosis present

## 2024-03-18 DIAGNOSIS — N390 Urinary tract infection, site not specified: Secondary | ICD-10-CM | POA: Diagnosis not present

## 2024-03-18 DIAGNOSIS — Z888 Allergy status to other drugs, medicaments and biological substances status: Secondary | ICD-10-CM

## 2024-03-18 DIAGNOSIS — F05 Delirium due to known physiological condition: Secondary | ICD-10-CM | POA: Diagnosis not present

## 2024-03-18 DIAGNOSIS — Z8051 Family history of malignant neoplasm of kidney: Secondary | ICD-10-CM

## 2024-03-18 DIAGNOSIS — G3184 Mild cognitive impairment, so stated: Secondary | ICD-10-CM | POA: Diagnosis present

## 2024-03-18 DIAGNOSIS — A419 Sepsis, unspecified organism: Secondary | ICD-10-CM | POA: Diagnosis not present

## 2024-03-18 DIAGNOSIS — R7881 Bacteremia: Secondary | ICD-10-CM | POA: Insufficient documentation

## 2024-03-18 DIAGNOSIS — K633 Ulcer of intestine: Secondary | ICD-10-CM

## 2024-03-18 DIAGNOSIS — R4182 Altered mental status, unspecified: Secondary | ICD-10-CM | POA: Diagnosis present

## 2024-03-18 DIAGNOSIS — E872 Acidosis, unspecified: Secondary | ICD-10-CM | POA: Diagnosis present

## 2024-03-18 DIAGNOSIS — A4151 Sepsis due to Escherichia coli [E. coli]: Principal | ICD-10-CM | POA: Diagnosis present

## 2024-03-18 DIAGNOSIS — N3000 Acute cystitis without hematuria: Secondary | ICD-10-CM | POA: Diagnosis present

## 2024-03-18 DIAGNOSIS — N1831 Chronic kidney disease, stage 3a: Secondary | ICD-10-CM | POA: Diagnosis present

## 2024-03-18 DIAGNOSIS — Z833 Family history of diabetes mellitus: Secondary | ICD-10-CM

## 2024-03-18 DIAGNOSIS — E1165 Type 2 diabetes mellitus with hyperglycemia: Secondary | ICD-10-CM | POA: Diagnosis present

## 2024-03-18 DIAGNOSIS — D62 Acute posthemorrhagic anemia: Secondary | ICD-10-CM

## 2024-03-18 DIAGNOSIS — K921 Melena: Secondary | ICD-10-CM | POA: Diagnosis not present

## 2024-03-18 DIAGNOSIS — Z79899 Other long term (current) drug therapy: Secondary | ICD-10-CM

## 2024-03-18 DIAGNOSIS — I152 Hypertension secondary to endocrine disorders: Secondary | ICD-10-CM

## 2024-03-18 DIAGNOSIS — D500 Iron deficiency anemia secondary to blood loss (chronic): Secondary | ICD-10-CM | POA: Diagnosis not present

## 2024-03-18 DIAGNOSIS — Z96651 Presence of right artificial knee joint: Secondary | ICD-10-CM | POA: Diagnosis present

## 2024-03-18 DIAGNOSIS — E1122 Type 2 diabetes mellitus with diabetic chronic kidney disease: Secondary | ICD-10-CM | POA: Diagnosis present

## 2024-03-18 DIAGNOSIS — Z8711 Personal history of peptic ulcer disease: Secondary | ICD-10-CM

## 2024-03-18 DIAGNOSIS — F32A Depression, unspecified: Secondary | ICD-10-CM | POA: Diagnosis present

## 2024-03-18 DIAGNOSIS — I10 Essential (primary) hypertension: Secondary | ICD-10-CM | POA: Diagnosis not present

## 2024-03-18 DIAGNOSIS — R531 Weakness: Secondary | ICD-10-CM

## 2024-03-18 DIAGNOSIS — E7849 Other hyperlipidemia: Secondary | ICD-10-CM | POA: Diagnosis present

## 2024-03-18 DIAGNOSIS — K567 Ileus, unspecified: Secondary | ICD-10-CM | POA: Diagnosis not present

## 2024-03-18 DIAGNOSIS — E871 Hypo-osmolality and hyponatremia: Secondary | ICD-10-CM | POA: Diagnosis present

## 2024-03-18 DIAGNOSIS — G9341 Metabolic encephalopathy: Secondary | ICD-10-CM | POA: Diagnosis present

## 2024-03-18 DIAGNOSIS — K449 Diaphragmatic hernia without obstruction or gangrene: Secondary | ICD-10-CM | POA: Diagnosis present

## 2024-03-18 DIAGNOSIS — K5730 Diverticulosis of large intestine without perforation or abscess without bleeding: Secondary | ICD-10-CM | POA: Diagnosis not present

## 2024-03-18 DIAGNOSIS — Z91040 Latex allergy status: Secondary | ICD-10-CM

## 2024-03-18 DIAGNOSIS — Z7984 Long term (current) use of oral hypoglycemic drugs: Secondary | ICD-10-CM

## 2024-03-18 DIAGNOSIS — I700 Atherosclerosis of aorta: Secondary | ICD-10-CM | POA: Diagnosis present

## 2024-03-18 DIAGNOSIS — E1159 Type 2 diabetes mellitus with other circulatory complications: Secondary | ICD-10-CM

## 2024-03-18 DIAGNOSIS — Z7982 Long term (current) use of aspirin: Secondary | ICD-10-CM

## 2024-03-18 DIAGNOSIS — D374 Neoplasm of uncertain behavior of colon: Secondary | ICD-10-CM

## 2024-03-18 DIAGNOSIS — E876 Hypokalemia: Secondary | ICD-10-CM | POA: Diagnosis present

## 2024-03-18 DIAGNOSIS — I129 Hypertensive chronic kidney disease with stage 1 through stage 4 chronic kidney disease, or unspecified chronic kidney disease: Secondary | ICD-10-CM | POA: Diagnosis not present

## 2024-03-18 DIAGNOSIS — R011 Cardiac murmur, unspecified: Secondary | ICD-10-CM | POA: Diagnosis not present

## 2024-03-18 DIAGNOSIS — Z90710 Acquired absence of both cervix and uterus: Secondary | ICD-10-CM

## 2024-03-18 DIAGNOSIS — D490 Neoplasm of unspecified behavior of digestive system: Secondary | ICD-10-CM | POA: Diagnosis not present

## 2024-03-18 DIAGNOSIS — D509 Iron deficiency anemia, unspecified: Secondary | ICD-10-CM | POA: Diagnosis present

## 2024-03-18 DIAGNOSIS — E119 Type 2 diabetes mellitus without complications: Secondary | ICD-10-CM | POA: Diagnosis not present

## 2024-03-18 DIAGNOSIS — K6389 Other specified diseases of intestine: Secondary | ICD-10-CM

## 2024-03-18 DIAGNOSIS — R195 Other fecal abnormalities: Secondary | ICD-10-CM

## 2024-03-18 LAB — CBC WITH DIFFERENTIAL/PLATELET
Abs Immature Granulocytes: 0.07 K/uL (ref 0.00–0.07)
Basophils Absolute: 0 K/uL (ref 0.0–0.1)
Basophils Relative: 0 %
Eosinophils Absolute: 0 K/uL (ref 0.0–0.5)
Eosinophils Relative: 0 %
HCT: 25.4 % — ABNORMAL LOW (ref 36.0–46.0)
Hemoglobin: 7.6 g/dL — ABNORMAL LOW (ref 12.0–15.0)
Immature Granulocytes: 1 %
Lymphocytes Relative: 6 %
Lymphs Abs: 0.5 K/uL — ABNORMAL LOW (ref 0.7–4.0)
MCH: 23.9 pg — ABNORMAL LOW (ref 26.0–34.0)
MCHC: 29.9 g/dL — ABNORMAL LOW (ref 30.0–36.0)
MCV: 79.9 fL — ABNORMAL LOW (ref 80.0–100.0)
Monocytes Absolute: 0.5 K/uL (ref 0.1–1.0)
Monocytes Relative: 5 %
Neutro Abs: 8.5 K/uL — ABNORMAL HIGH (ref 1.7–7.7)
Neutrophils Relative %: 88 %
Platelets: 340 K/uL (ref 150–400)
RBC: 3.18 MIL/uL — ABNORMAL LOW (ref 3.87–5.11)
RDW: 15.5 % (ref 11.5–15.5)
WBC: 9.6 K/uL (ref 4.0–10.5)
nRBC: 0.3 % — ABNORMAL HIGH (ref 0.0–0.2)

## 2024-03-18 LAB — COMPREHENSIVE METABOLIC PANEL WITH GFR
ALT: 11 U/L (ref 0–44)
AST: 17 U/L (ref 15–41)
Albumin: 4 g/dL (ref 3.5–5.0)
Alkaline Phosphatase: 106 U/L (ref 38–126)
Anion gap: 16 — ABNORMAL HIGH (ref 5–15)
BUN: 15 mg/dL (ref 8–23)
CO2: 21 mmol/L — ABNORMAL LOW (ref 22–32)
Calcium: 9.2 mg/dL (ref 8.9–10.3)
Chloride: 94 mmol/L — ABNORMAL LOW (ref 98–111)
Creatinine, Ser: 1.08 mg/dL — ABNORMAL HIGH (ref 0.44–1.00)
GFR, Estimated: 52 mL/min — ABNORMAL LOW
Glucose, Bld: 237 mg/dL — ABNORMAL HIGH (ref 70–99)
Potassium: 4.6 mmol/L (ref 3.5–5.1)
Sodium: 131 mmol/L — ABNORMAL LOW (ref 135–145)
Total Bilirubin: 0.7 mg/dL (ref 0.0–1.2)
Total Protein: 7.3 g/dL (ref 6.5–8.1)

## 2024-03-18 LAB — GLUCOSE, CAPILLARY: Glucose-Capillary: 241 mg/dL — ABNORMAL HIGH (ref 70–99)

## 2024-03-18 LAB — URINALYSIS, W/ REFLEX TO CULTURE (INFECTION SUSPECTED)
Bilirubin Urine: NEGATIVE
Glucose, UA: >=500 mg/dL — AB
Hgb urine dipstick: NEGATIVE
Ketones, ur: 5 mg/dL — AB
Nitrite: NEGATIVE
Protein, ur: 30 mg/dL — AB
Specific Gravity, Urine: 1.011 (ref 1.005–1.030)
WBC, UA: >50 WBC/hpf (ref 0–5)
pH: 6 (ref 5.0–8.0)

## 2024-03-18 LAB — PROTIME-INR
INR: 1.1 (ref 0.8–1.2)
Prothrombin Time: 14.9 s (ref 11.4–15.2)

## 2024-03-18 LAB — I-STAT CG4 LACTIC ACID, ED: Lactic Acid, Venous: 1.6 mmol/L (ref 0.5–1.9)

## 2024-03-18 MED ORDER — LACTATED RINGERS IV SOLN: INTRAVENOUS | Status: DC

## 2024-03-18 MED ORDER — ONDANSETRON HCL 4 MG/2ML IJ SOLN: 4.0000 mg | Freq: Four times a day (QID) | INTRAMUSCULAR | Status: DC | PRN

## 2024-03-18 MED ORDER — ACETAMINOPHEN 160 MG/5ML PO SOLN
650.0000 mg | Freq: Once | ORAL | Status: AC
  Administered 2024-03-18: 650 mg via ORAL
  Filled 2024-03-18: qty 20.3

## 2024-03-18 MED ORDER — SENNOSIDES-DOCUSATE SODIUM 8.6-50 MG PO TABS: 1.0000 | ORAL_TABLET | Freq: Every evening | ORAL | Status: DC | PRN

## 2024-03-18 MED ORDER — ACETAMINOPHEN 160 MG/5ML PO SOLN: 500.0000 mg | Freq: Once | ORAL | Status: DC

## 2024-03-18 MED ORDER — LACTATED RINGERS IV BOLUS (SEPSIS)
1000.0000 mL | Freq: Once | INTRAVENOUS | Status: AC
  Administered 2024-03-18: 1000 mL via INTRAVENOUS

## 2024-03-18 MED ORDER — VANCOMYCIN HCL 1500 MG/300ML IV SOLN
1500.0000 mg | Freq: Once | INTRAVENOUS | Status: AC
  Administered 2024-03-18: 1500 mg via INTRAVENOUS
  Filled 2024-03-18: qty 300

## 2024-03-18 MED ORDER — PRAVASTATIN SODIUM 20 MG PO TABS
40.0000 mg | ORAL_TABLET | Freq: Every day | ORAL | Status: AC
  Administered 2024-03-18 – 2024-03-27 (×10): 40 mg via ORAL
  Filled 2024-03-18 (×10): qty 2

## 2024-03-18 MED ORDER — GLIPIZIDE ER 10 MG PO TB24
10.0000 mg | ORAL_TABLET | Freq: Two times a day (BID) | ORAL | Status: DC
  Administered 2024-03-19: 10 mg via ORAL
  Filled 2024-03-18 (×2): qty 1

## 2024-03-18 MED ORDER — INSULIN ASPART 100 UNIT/ML IJ SOLN
0.0000 [IU] | Freq: Every day | INTRAMUSCULAR | Status: DC
  Administered 2024-03-18: 2 [IU] via SUBCUTANEOUS
  Administered 2024-03-21: 3 [IU] via SUBCUTANEOUS
  Administered 2024-03-22: 4 [IU] via SUBCUTANEOUS
  Administered 2024-03-23: 3 [IU] via SUBCUTANEOUS
  Administered 2024-03-25 – 2024-03-27 (×2): 2 [IU] via SUBCUTANEOUS
  Filled 2024-03-18: qty 2
  Filled 2024-03-18: qty 3
  Filled 2024-03-18: qty 4
  Filled 2024-03-18 (×2): qty 2
  Filled 2024-03-18: qty 3

## 2024-03-18 MED ORDER — VANCOMYCIN HCL IN DEXTROSE 1-5 GM/200ML-% IV SOLN: 1000.0000 mg | Freq: Once | INTRAVENOUS | Status: DC

## 2024-03-18 MED ORDER — LACTATED RINGERS IV BOLUS (SEPSIS)
250.0000 mL | Freq: Once | INTRAVENOUS | Status: AC
  Administered 2024-03-18: 250 mL via INTRAVENOUS

## 2024-03-18 MED ORDER — SODIUM CHLORIDE 0.9 % IV SOLN
1.0000 g | INTRAVENOUS | Status: DC
  Administered 2024-03-19: 1 g via INTRAVENOUS
  Filled 2024-03-18: qty 10

## 2024-03-18 MED ORDER — ENOXAPARIN SODIUM 40 MG/0.4ML IJ SOSY
40.0000 mg | PREFILLED_SYRINGE | INTRAMUSCULAR | Status: DC
  Administered 2024-03-18: 40 mg via SUBCUTANEOUS
  Filled 2024-03-18: qty 0.4

## 2024-03-18 MED ORDER — LOSARTAN POTASSIUM 50 MG PO TABS
50.0000 mg | ORAL_TABLET | Freq: Every day | ORAL | Status: DC
  Administered 2024-03-19 – 2024-03-28 (×10): 50 mg via ORAL
  Filled 2024-03-18 (×10): qty 1

## 2024-03-18 MED ORDER — INSULIN ASPART 100 UNIT/ML IJ SOLN
0.0000 [IU] | Freq: Three times a day (TID) | INTRAMUSCULAR | Status: AC
  Administered 2024-03-19: 2 [IU] via SUBCUTANEOUS
  Administered 2024-03-20: 5 [IU] via SUBCUTANEOUS
  Administered 2024-03-21: 8 [IU] via SUBCUTANEOUS
  Administered 2024-03-21 – 2024-03-22 (×3): 3 [IU] via SUBCUTANEOUS
  Administered 2024-03-22: 5 [IU] via SUBCUTANEOUS
  Administered 2024-03-22: 2 [IU] via SUBCUTANEOUS
  Administered 2024-03-23: 8 [IU] via SUBCUTANEOUS
  Administered 2024-03-23: 5 [IU] via SUBCUTANEOUS
  Administered 2024-03-24 (×2): 3 [IU] via SUBCUTANEOUS
  Administered 2024-03-24: 5 [IU] via SUBCUTANEOUS
  Administered 2024-03-25: 2 [IU] via SUBCUTANEOUS
  Administered 2024-03-25: 5 [IU] via SUBCUTANEOUS
  Administered 2024-03-26: 3 [IU] via SUBCUTANEOUS
  Administered 2024-03-26: 8 [IU] via SUBCUTANEOUS
  Administered 2024-03-26: 5 [IU] via SUBCUTANEOUS
  Administered 2024-03-27 (×2): 3 [IU] via SUBCUTANEOUS
  Administered 2024-03-27: 8 [IU] via SUBCUTANEOUS
  Administered 2024-03-28 (×2): 5 [IU] via SUBCUTANEOUS
  Filled 2024-03-18: qty 8
  Filled 2024-03-18: qty 2
  Filled 2024-03-18: qty 5
  Filled 2024-03-18 (×2): qty 3
  Filled 2024-03-18: qty 8
  Filled 2024-03-18: qty 3
  Filled 2024-03-18: qty 8
  Filled 2024-03-18 (×6): qty 5
  Filled 2024-03-18: qty 3
  Filled 2024-03-18: qty 2
  Filled 2024-03-18: qty 3
  Filled 2024-03-18: qty 5
  Filled 2024-03-18 (×3): qty 3
  Filled 2024-03-18: qty 5
  Filled 2024-03-18: qty 3

## 2024-03-18 MED ORDER — HYDRALAZINE HCL 20 MG/ML IJ SOLN: 10.0000 mg | Freq: Three times a day (TID) | INTRAMUSCULAR | Status: DC | PRN

## 2024-03-18 MED ORDER — BISACODYL 5 MG PO TBEC: 5.0000 mg | DELAYED_RELEASE_TABLET | Freq: Every day | ORAL | Status: AC | PRN

## 2024-03-18 MED ORDER — SODIUM CHLORIDE 0.9 % IV SOLN
2.0000 g | Freq: Once | INTRAVENOUS | Status: AC
  Administered 2024-03-18: 2 g via INTRAVENOUS
  Filled 2024-03-18: qty 12.5

## 2024-03-18 MED ORDER — ACETAMINOPHEN 325 MG PO TABS
650.0000 mg | ORAL_TABLET | Freq: Four times a day (QID) | ORAL | Status: DC | PRN
  Filled 2024-03-18: qty 2

## 2024-03-18 MED ORDER — ACETAMINOPHEN 650 MG RE SUPP: 650.0000 mg | Freq: Four times a day (QID) | RECTAL | Status: AC | PRN

## 2024-03-18 MED ORDER — ONDANSETRON HCL 4 MG PO TABS: 4.0000 mg | ORAL_TABLET | Freq: Four times a day (QID) | ORAL | Status: DC | PRN

## 2024-03-18 MED ORDER — METRONIDAZOLE 500 MG/100ML IV SOLN
500.0000 mg | Freq: Once | INTRAVENOUS | Status: AC
  Administered 2024-03-18: 500 mg via INTRAVENOUS
  Filled 2024-03-18: qty 100

## 2024-03-18 NOTE — ED Provider Notes (Signed)
 " [place] EMERGENCY DEPARTMENT AT [facility] Provider Note   CSN: [id number] Arrival date & time: 03/18/24  1603     Patient presents with: Altered Mental Status   Misty Cervantes is a 79 y.o. female.   HPI Pt bib EMS from home for AMS. Last known well yesterday. Pt was recently seen for dysuria by PCP. Pt A&Ox3. Grandkids called EMS today. Pt c/o lower back pain. 140/80 CBG 322  Patient herself cannot provide any specific details, offers rambling account of why she is here.  Level 5 caveat secondary to mental status changes.    Prior to Admission medications  Medication Sig Start Date End Date Taking? Authorizing Provider  ACCU-CHEK GUIDE test strip USE 1 STRIP TWICE A DAY 10/04/22   Lucius Krabbe, NP  Accu-Chek Softclix Lancets lancets CHECK BLOOD SUGARS 1-2 TIMES PER DAY. 09/12/23   Lucius Krabbe, NP  acetaminophen  (TYLENOL ) 500 MG tablet Take 500-1,000 mg by mouth 2 (two) times daily as needed for headache or mild pain.    [provider]  ALAVERT  10 MG dissolvable tablet Take 10 mg by mouth daily as needed for allergies.    [provider]  aspirin  EC 81 MG tablet Take 81 mg by mouth daily. Swallow whole.    [provider]  Blood Glucose Monitoring Suppl (ACCU-CHEK GUIDE) w/Device KIT Use as needed 09/07/21   Lucius Krabbe, NP  Cyanocobalamin  (B-12 PO) Take 1 tablet by mouth daily.    [provider]  empagliflozin  (JARDIANCE ) 10 MG TABS tablet Take 1 tablet (10 mg total) by mouth daily before breakfast. Patient not taking: Reported on 02/28/2024 07/18/23   Lucius Krabbe, NP  fluticasone  (FLONASE ) 50 MCG/ACT nasal spray SPRAY 1 SPRAY INTO EACH NOSTRIL TWICE A DAY AS NEEDED FOR ALLERGIES OR RHINITIS 03/25/23   Lucius Krabbe, NP  glipiZIDE  (GLUCOTROL  XL) 10 MG 24 hr tablet Take 1 tablet (10 mg total) by mouth 2 (two) times daily after a meal. 02/28/24   Lucius Krabbe, NP  losartan  (COZAAR ) 50 MG tablet Take  1 tablet (50 mg total) by mouth daily. 02/28/24   Lucius Krabbe, NP  metFORMIN  (GLUCOPHAGE -XR) 500 MG 24 hr tablet TAKE 1 TABLET BY MOUTH EVERY DAY WITH BREAKFAST. 02/28/24   Lucius Krabbe, NP  Multiple Vitamin (MULTIVITAMIN WITH MINERALS) TABS tablet Take 1 tablet by mouth daily with breakfast.    [provider]  pravastatin  (PRAVACHOL ) 40 MG tablet TAKE 1 TABLET BY MOUTH EVERYDAY AT BEDTIME 02/28/24   Hudnell, Krabbe, NP  traZODone  (DESYREL ) 50 MG tablet Take 0.5-1 tablets (25-50 mg total) by mouth at bedtime as needed. for sleep 07/18/23   Lucius Krabbe, NP    Allergies: Bacid, Latex, and Norvasc  [amlodipine ]    Review of Systems  Updated Vital Signs BP 130/67   Pulse (!) 104   Temp 98.2 F (36.8 C) (Oral)   Resp (!) 27   Ht 1.499 m (4' 11)   Wt 66.2 kg   LMP  (LMP Unknown)   SpO2 90%   BMI 29.49 kg/m   Physical Exam Vitals and nursing note reviewed.  Constitutional:      Appearance: She is well-developed. She is ill-appearing.  HENT:     Head: Normocephalic and atraumatic.  Eyes:     Conjunctiva/sclera: Conjunctivae normal.  Cardiovascular:     Rate and Rhythm: Regular rhythm. Tachycardia present.  Pulmonary:     Effort: Pulmonary effort is normal. No respiratory distress.     Breath  sounds: Normal breath sounds. No stridor.  Abdominal:     General: There is no distension.  Skin:    General: Skin is warm and dry.  Neurological:     Mental Status: She is alert.     Cranial Nerves: No cranial nerve deficit.  Psychiatric:        Mood and Affect: Mood normal.        Cognition and Memory: Cognition is impaired. Memory is impaired.     (all labs ordered are listed, but only abnormal results are displayed) Labs Reviewed  COMPREHENSIVE METABOLIC PANEL WITH GFR - Abnormal; Notable for the following components:      Result Value   Sodium 131 (*)    Chloride 94 (*)    CO2 21 (*)    Glucose, Bld 237 (*)    Creatinine, Ser 1.08 (*)    GFR,  Estimated 52 (*)    Anion gap 16 (*)    All other components within normal limits  CBC WITH DIFFERENTIAL/PLATELET - Abnormal; Notable for the following components:   RBC 3.18 (*)    Hemoglobin 7.6 (*)    HCT 25.4 (*)    MCV 79.9 (*)    MCH 23.9 (*)    MCHC 29.9 (*)    nRBC 0.3 (*)    Neutro Abs 8.5 (*)    Lymphs Abs 0.5 (*)    All other components within normal limits  URINALYSIS, W/ REFLEX TO CULTURE (INFECTION SUSPECTED) - Abnormal; Notable for the following components:   Color, Urine STRAW (*)    APPearance HAZY (*)    Glucose, UA >=500 (*)    Ketones, ur 5 (*)    Protein, ur 30 (*)    Leukocytes,Ua MODERATE (*)    Bacteria, UA MANY (*)    All other components within normal limits  CULTURE, BLOOD (ROUTINE X 2)  CULTURE, BLOOD (ROUTINE X 2)  URINE CULTURE  PROTIME-INR  I-STAT CG4 LACTIC ACID, ED  I-STAT CG4 LACTIC ACID, ED    EKG: EKG Interpretation Date/Time:  Sunday March 18 2024 17:37:54 EST Ventricular Rate:  126 PR Interval:  131 QRS Duration:  85 QT Interval:  355 QTC Calculation: 514 R Axis:   30  Text Interpretation: Ectopic atrial tachycardia, unifocal Atrial premature complex Abnormal R-wave progression, late transition Prolonged QT interval Confirmed by Garrick Charleston [phone]) on 03/18/2024 5:41:03 PM  Radiology: CT Head Wo Contrast Result Date: 03/18/2024 EXAM: CT HEAD WITHOUT 03/18/2024 06:52:20 PM TECHNIQUE: CT of the head was performed without the administration of intravenous contrast. Automated exposure control, iterative reconstruction, and/or weight based adjustment of the mA/kV was utilized to reduce the radiation dose to as low as reasonably achievable. COMPARISON: 03/01/2019 CLINICAL HISTORY: Mental status change, unknown cause. FINDINGS: BRAIN AND VENTRICLES: No acute intracranial hemorrhage. No mass effect or midline shift. No extra-axial fluid collection. No evidence of acute infarct. No hydrocephalus. ORBITS: No acute abnormality. SINUSES AND  MASTOIDS: No acute abnormality. SOFT TISSUES AND SKULL: No acute skull fracture. No acute soft tissue abnormality. IMPRESSION: 1. No acute intracranial abnormality. Electronically signed by: Franky Crease MD 03/18/2024 07:05 PM EST RP Workstation: [id number]   DG Chest Port 1 View Result Date: 03/18/2024 EXAM: 1 VIEW(S) XRAY OF THE CHEST 03/18/2024 04:50:00 PM COMPARISON: CT chest 10/23/2023. CLINICAL HISTORY: Questionable sepsis - evaluate for abnormality. FINDINGS: LUNGS AND PLEURA: Lungs are clear. No pleural effusion. No pneumothorax. HEART AND MEDIASTINUM: Prominent mediastinal shadow corresponding to prominent mediastinal fat on prior CT. BONES  AND SOFT TISSUES: No acute osseous abnormality. IMPRESSION: 1. No acute cardiopulmonary findings. Electronically signed by: Elsie Gravely MD 03/18/2024 05:04 PM EST RP Workstation: [id number]     Procedures   Medications Ordered in the ED  lactated ringers  infusion ( Intravenous New Bag/Given 03/18/24 1732)  acetaminophen  (TYLENOL ) 160 MG/5ML solution 500 mg (500 mg Oral Not Given 03/18/24 2013)  acetaminophen  (TYLENOL ) 160 MG/5ML solution 650 mg (650 mg Oral Given 03/18/24 1737)  lactated ringers  bolus 1,000 mL (0 mLs Intravenous Stopped 03/18/24 1801)    And  lactated ringers  bolus 1,000 mL (1,000 mLs Intravenous New Bag/Given 03/18/24 1732)    And  lactated ringers  bolus 250 mL (0 mLs Intravenous Stopped 03/18/24 1838)  ceFEPIme  (MAXIPIME ) 2 g in sodium chloride  0.9 % 100 mL IVPB (0 g Intravenous Stopped 03/18/24 1800)  metroNIDAZOLE  (FLAGYL ) IVPB 500 mg (0 mg Intravenous Stopped 03/18/24 1831)  vancomycin  (VANCOREADY) IVPB 1500 mg/300 mL (1,500 mg Intravenous New Bag/Given 03/18/24 1733)                                    Medical Decision Making Elderly female presents with confusion, on initial exam is febrile, tachycardic and tachypneic, meets sepsis criteria received empiric Tylenol , antibiotics, fluids. Patient without evidence for acute meningismus,  with some history suggesting possible urinary tract infection Cardiac 120 sinus tach abnormal pulse ox 96% room air normal  Amount and/or Complexity of Data Reviewed Independent Historian: EMS External Data Reviewed: notes. Labs: ordered. Decision-making details documented in ED Course. Radiology: ordered and independent interpretation performed. Decision-making details documented in ED Course. ECG/medicine tests: ordered and independent interpretation performed. Decision-making details documented in ED Course.  Risk OTC drugs. Prescription drug management. Decision regarding hospitalization. Diagnosis or treatment significantly limited by social determinants of health.   8:18 PM Patient now much more interactive appropriately, accompanied by 2 family members.  Final labs available, reviewed, discussed. Evidence for urinary tract infection, anemia, no pneumonia, no intracranial abnormality. Patient's vital signs have substantially improved, she is now afebrile, with only mild tachycardia, there was concern for severe sepsis given altered mental status, substantial abnormal vital signs, patient will be admitted for further monitoring, management.  CRITICAL CARE Performed by: Lamar Salen Total critical care time: 35 minutes Critical care time was exclusive of separately billable procedures and treating other patients. Critical care was necessary to treat or prevent imminent or life-threatening deterioration. Critical care was time spent personally by me on the following activities: development of treatment plan with patient and/or surrogate as well as nursing, discussions with consultants, evaluation of patient's response to treatment, examination of patient, obtaining history from patient or surrogate, ordering and performing treatments and interventions, ordering and review of laboratory studies, ordering and review of radiographic studies, pulse oximetry and re-evaluation of patient's  condition.    Final diagnoses:  Severe sepsis [facility])     Salen Lamar, MD 03/18/24 2020  "

## 2024-03-18 NOTE — Progress Notes (Signed)
 Elink following for sepsis protocol.

## 2024-03-18 NOTE — ED Notes (Signed)
 After speaking with Recardo Rummer, RN with sepsis, spoke with admitting MD about second lactic acid order.  Received notification from Dr. Lou that second lactic not needed. Order cancelled, notified Millard, RN via secure chat

## 2024-03-18 NOTE — H&P (Addendum)
 " History and Physical  Misty Cervantes FMW:[id number] DOB: [birth year] DOA: 03/18/2024  PCP: Lucius Krabbe, NP   Chief Complaint: Generalized weakness, confusion  HPI: Misty Cervantes is a 79 y.o. female with medical history significant for T2DM, HTN, HLD, osteoarthritis, CKD 3A, spinal stenosis and insomnia who presented to the ED for evaluation of generalized weakness, nausea and confusion Per family, patient has just not been feeling well for about a week and had reported feeling dizzy and nauseous over the last week. Today she had trouble getting out of bed due to weakness and had memory loss, forgetting the name of her son.  At bedside, patient reports she could not remember because she was having fevers. Patient somnolent but easily awakens and opens eyes spontaneously. Reports she has felt sick for weeks and has had some nausea but no vomiting, abdominal pain, dysuria, chills, chest pain, cough or shortness of breath.  She feels tired but not confused, she is able to answer all orientation questions including full name, DOB, current location, current month/year and identified granddaughters names at bedside.  ED Course: Initial vitals show Tmax 102.6, RR 22-38, HR 100-130s, SBP 140-180s, SpO2 96% on room air. Initial labs significant for sodium 131, bicarb 21, glucose 237, creatinine 1.08, WBC 9.6, Hgb 7.6, lactic acid 1.6, UA shows significant glucosuria, negative nitrite, no moderate leuks, WBC >50 and many bacteria. EKG shows ectopic atrial tachycardia and prolonged QTc of 514. CXR shows no active disease. CT head with no acute intracranial abnormality.  Sepsis protocol was activated and patient received Tylenol , IV vancomycin , IV cefepime , IV Flagyl  and IV LR boluses. TRH was consulted for admission.   Review of Systems: Please see HPI for pertinent positives and negatives. A complete 10 system review of systems are otherwise negative.  Past Medical History:  Diagnosis Date   Abdominal  pain 08/23/2021   Chronic kidney disease (CKD), stage III (moderate) (HCC)    followed by pcp   Dysphagia 01/22/2021   Full dentures    HCAP (healthcare-associated pneumonia) 08/22/2021   Headache 08/23/2021   History of 2019 novel coronavirus disease (COVID-19) 04/05/2019   result in care everywhere ,  per pt mild symptoms that resolved   History of gastric ulcer 2013   non-bleeding   Hyperlipemia    Hypokalemia    Hyponatremia 08/23/2021   Low serum potassium level 01/29/2021   Mixed stress and urge incontinence    Osteoarthritis    Presence of pessary    Respiratory distress 11/28/2021   Sepsis (HCC) 11/28/2021   Type 2 diabetes mellitus (HCC)    followed by pcp   (08-26-2020 per pt checks blood sugar twice weekly, unsure what fasting sugar is)   UTI (urinary tract infection) 08/23/2021   Vaginal vault 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    gyn--- dr jannis, using pessary   Wears glasses    Past Surgical History:  Procedure Laterality Date   CARPAL TUNNEL RELEASE Right    1980s   CATARACT EXTRACTION W/ INTRAOCULAR LENS IMPLANT Bilateral 2013   approx   COLONOSCOPY WITH ESOPHAGOGASTRODUODENOSCOPY (EGD)  11/17/2011   COLPOCLEISIS N/A 09/02/2020   Procedure: COLPOCLEISIS, PERINEORRHAPHY;  Surgeon: Jannis Kate Norris, MD;  Location: [facility] ;  Service: Gynecology;  Laterality: N/A;   CYSTOSCOPY N/A 09/02/2020   Procedure: CYSTOSCOPY;  Surgeon: Jannis Kate Norris, MD;  Location: [facility];  Service: Gynecology;  Laterality: N/A;   RETINAL DETACHMENT SURGERY Right 2003   TOTAL HIP ARTHROPLASTY Left 03/02/2010   @  [facility]   TOTAL KNEE ARTHROPLASTY Right 11/03/2015   Procedure: RIGHT TOTAL KNEE ARTHROPLASTY;  Surgeon: Dempsey Moan, MD;  Location: [facility];  Service: Orthopedics;  Laterality: Right;   TOTAL VAGINAL HYSTERECTOMY  2012   TVH, anterior repair, TVT (no mesh used for the anterior repair)   Social History:  reports that she has never smoked.  She has never used smokeless tobacco. She reports that she does not drink alcohol and does not use drugs.  Allergies[1]  Family History  Problem Relation Age of Onset   Diabetes Mother        deceased   Heart attack Mother    Diabetes Sister        x2   Kidney cancer Father    Heart attack Father    Diabetes Sister      Prior to Admission medications  Medication Sig Start Date End Date Taking? Authorizing Provider  ACCU-CHEK GUIDE test strip USE 1 STRIP TWICE A DAY 10/04/22   Lucius Krabbe, NP  Accu-Chek Softclix Lancets lancets CHECK BLOOD SUGARS 1-2 TIMES PER DAY. 09/12/23   Lucius Krabbe, NP  acetaminophen  (TYLENOL ) 500 MG tablet Take 500-1,000 mg by mouth 2 (two) times daily as needed for headache or mild pain.    [provider]  ALAVERT  10 MG dissolvable tablet Take 10 mg by mouth daily as needed for allergies.    [provider]  aspirin  EC 81 MG tablet Take 81 mg by mouth daily. Swallow whole.    [provider]  Blood Glucose Monitoring Suppl (ACCU-CHEK GUIDE) w/Device KIT Use as needed 09/07/21   Lucius Krabbe, NP  Cyanocobalamin  (B-12 PO) Take 1 tablet by mouth daily.    [provider]  empagliflozin  (JARDIANCE ) 10 MG TABS tablet Take 1 tablet (10 mg total) by mouth daily before breakfast. Patient not taking: Reported on 02/28/2024 07/18/23   Lucius Krabbe, NP  fluticasone  (FLONASE ) 50 MCG/ACT nasal spray SPRAY 1 SPRAY INTO EACH NOSTRIL TWICE A DAY AS NEEDED FOR ALLERGIES OR RHINITIS 03/25/23   Lucius Krabbe, NP  glipiZIDE  (GLUCOTROL  XL) 10 MG 24 hr tablet Take 1 tablet (10 mg total) by mouth 2 (two) times daily after a meal. 02/28/24   Hudnell, Krabbe, NP  losartan  (COZAAR ) 50 MG tablet Take 1 tablet (50 mg total) by mouth daily. 02/28/24   Lucius Krabbe, NP  metFORMIN  (GLUCOPHAGE -XR) 500 MG 24 hr tablet TAKE 1 TABLET BY MOUTH EVERY DAY WITH BREAKFAST. 02/28/24   Lucius Krabbe, NP  Multiple Vitamin  (MULTIVITAMIN WITH MINERALS) TABS tablet Take 1 tablet by mouth daily with breakfast.    [provider]  pravastatin  (PRAVACHOL ) 40 MG tablet TAKE 1 TABLET BY MOUTH EVERYDAY AT BEDTIME 02/28/24   Hudnell, Krabbe, NP  traZODone  (DESYREL ) 50 MG tablet Take 0.5-1 tablets (25-50 mg total) by mouth at bedtime as needed. for sleep 07/18/23   Lucius Krabbe, NP    Physical Exam: BP 130/67   Pulse (!) 104   Temp 98.2 F (36.8 C) (Oral)   Resp (!) 27   Ht 4' 11 (1.499 m)   Wt 66.2 kg   LMP  (LMP Unknown)   SpO2 90%   BMI 29.49 kg/m  General: Weak appearing elderly woman laying in bed. No acute distress. HEENT: [place]/AT. Anicteric sclera.  Dry mucous membrane. CV: Tachycardic. Regular rhythm. No murmurs, rubs, or gallops. No LE edema Pulmonary: Lungs CTAB. Normal effort. No wheezing or rales. Abdominal: Soft, nontender, nondistended. Normal bowel sounds. Extremities: Palpable radial  and DP pulses. Normal ROM. Skin: Warm and dry. No obvious rash or lesions. Neuro: Somnolent but opens eyes spontaneously and oriented to self, person, time and place. Moves all extremities. Normal sensation to light touch. No focal deficit. Psych: Normal mood and affect          Labs on Admission:  Basic Metabolic Panel: Recent Labs  Lab 03/18/24 1708  NA 131*  K 4.6  CL 94*  CO2 21*  GLUCOSE 237*  BUN 15  CREATININE 1.08*  CALCIUM  9.2   Liver Function Tests: Recent Labs  Lab 03/18/24 1708  AST 17  ALT 11  ALKPHOS 106  BILITOT 0.7  PROT 7.3  ALBUMIN 4.0   No results for input(s): LIPASE, AMYLASE in the last 168 hours. No results for input(s): AMMONIA in the last 168 hours. CBC: Recent Labs  Lab 03/18/24 1708  WBC 9.6  NEUTROABS 8.5*  HGB 7.6*  HCT 25.4*  MCV 79.9*  PLT 340   Cardiac Enzymes: No results for input(s): CKTOTAL, CKMB, CKMBINDEX, TROPONINI in the last 168 hours. BNP (last 3 results) No results for input(s): BNP in the last 8760  hours.  ProBNP (last 3 results) No results for input(s): PROBNP in the last 8760 hours.  CBG: No results for input(s): GLUCAP in the last 168 hours.  Radiological Exams on Admission: CT Head Wo Contrast Result Date: 03/18/2024 EXAM: CT HEAD WITHOUT 03/18/2024 06:52:20 PM TECHNIQUE: CT of the head was performed without the administration of intravenous contrast. Automated exposure control, iterative reconstruction, and/or weight based adjustment of the mA/kV was utilized to reduce the radiation dose to as low as reasonably achievable. COMPARISON: 03/01/2019 CLINICAL HISTORY: Mental status change, unknown cause. FINDINGS: BRAIN AND VENTRICLES: No acute intracranial hemorrhage. No mass effect or midline shift. No extra-axial fluid collection. No evidence of acute infarct. No hydrocephalus. ORBITS: No acute abnormality. SINUSES AND MASTOIDS: No acute abnormality. SOFT TISSUES AND SKULL: No acute skull fracture. No acute soft tissue abnormality. IMPRESSION: 1. No acute intracranial abnormality. Electronically signed by: Franky Crease MD 03/18/2024 07:05 PM EST RP Workstation: [id number]   DG Chest Port 1 View Result Date: 03/18/2024 EXAM: 1 VIEW(S) XRAY OF THE CHEST 03/18/2024 04:50:00 PM COMPARISON: CT chest 10/23/2023. CLINICAL HISTORY: Questionable sepsis - evaluate for abnormality. FINDINGS: LUNGS AND PLEURA: Lungs are clear. No pleural effusion. No pneumothorax. HEART AND MEDIASTINUM: Prominent mediastinal shadow corresponding to prominent mediastinal fat on prior CT. BONES AND SOFT TISSUES: No acute osseous abnormality. IMPRESSION: 1. No acute cardiopulmonary findings. Electronically signed by: Elsie Gravely MD 03/18/2024 05:04 PM EST RP Workstation: [id number]   Assessment/Plan Misty Cervantes is a 79 y.o. female with medical history significant for T2DM, HTN, HLD, osteoarthritis, CKD 3A, spinal stenosis and insomnia who presented to the ED for evaluation of generalized weakness, nausea and  confusion and admitted for sepsis secondary to UTI.   # Sepsis # Acute cystitis - Patient with hx of UTIs presented with generalized weakness, nausea, fevers and mild confusion (resolved) - Urinalysis shows signs of infection - Met sepsis criteria with tachycardia, fever, tachypnea and evidence of urinary infection - Start IV Rocephin  - Continue IV hydration with IV LR @ 150cc/hr - Follow-up blood and urine cultures - Trend CBC, fever curve  # T2DM with hyperglycemia - Uncontrolled diabetes with last A1c of 10.7 2 weeks ago - Blood glucose of 237 on admission - Home regimen includes glipizide , metformin  and Jardiance , not on insulin  - Continue glipizide , hold metformin  and Jardiance  - SSI  with meals, CBG monitoring with carb modified diet - Discussed with patient and granddaughters about Jardiance  likely increasing her risk of further UTIs, advised them to discuss with her PCP about discontinuing Jardiance  and possibly initiating insulin  in the outpatient.  # Microcytic anemia - Hgb down to 7.6 from 11-12 2 years ago, no recent CBCs since then - Patient denies any melena, hematochezia, hematuria, hemoptysis or bruises - Check iron studies, ferritin and vitamin B12 - Trend CBC, transfuse for hgb > 7  # CKD 3A # AGMA - Creatinine of 1.08, stable compared to her baseline of 1.0-1.2, bicarb low at 21 with AG of 16 likely in the setting of dehydration - Continue IV hydration as above - Trend renal function, avoid nephrotoxic agents  # HTN - BP initially elevated with SBP in the 160s to 180s, improved to the 130-140s - Continue losartan  - IV hydralazine  as needed for SBP > 180  # QTc prolongation - EKG on admission shows prolonged QTc of 514 - Avoid QTc prolonging meds and check electrolytes - Telemetry  # HLD - Continue simvastatin  # Generalized weakness - In the setting of acute illness and dehydration - PT/OT eval and treat - Fall precautions  DVT prophylaxis: Lovenox       Code Status: Full Code  Consults called: None  Family Communication: Discussed results/findings and plan for admission with granddaughters at bedside  Severity of Illness: The appropriate patient status for this patient is INPATIENT. Inpatient status is judged to be reasonable and necessary in order to provide the required intensity of service to ensure the patient's safety. The patient's presenting symptoms, physical exam findings, and initial radiographic and laboratory data in the context of their chronic comorbidities is felt to place them at high risk for further clinical deterioration. Furthermore, it is not anticipated that the patient will be medically stable for discharge from the hospital within 2 midnights of admission.   * I certify that at the point of admission it is my clinical judgment that the patient will require inpatient hospital care spanning beyond 2 midnights from the point of admission due to high intensity of service, high risk for further deterioration and high frequency of surveillance required.*  Level of care: Telemetry    Lou Claretta HERO, MD 03/18/2024, 9:24 PM [facility] Pager: [phone] Isaiah [phone]   If 7PM-7AM, please contact night-coverage [email or web address] Password [email or web address]     [1]  Allergies Allergen Reactions   Bacid    Latex Other (See Comments)    Exact reaction not cited   Norvasc  [Amlodipine ] Swelling   "

## 2024-03-18 NOTE — ED Notes (Signed)
 Admitting MD at bedside.

## 2024-03-18 NOTE — ED Notes (Signed)
 Dr. Garrick at bedside updating patient and family of patient on plan of care and disposition

## 2024-03-18 NOTE — ED Triage Notes (Signed)
 Pt bib EMS from home for AMS. Last known well yesterday. Pt was recently seen for dysuria by PCP. Pt A&Ox3. Grandkids called EMS today. Pt c/o lower back pain. 140/80 CBG 322

## 2024-03-19 DIAGNOSIS — A419 Sepsis, unspecified organism: Secondary | ICD-10-CM

## 2024-03-19 DIAGNOSIS — N390 Urinary tract infection, site not specified: Secondary | ICD-10-CM

## 2024-03-19 DIAGNOSIS — R7881 Bacteremia: Secondary | ICD-10-CM | POA: Insufficient documentation

## 2024-03-19 LAB — BLOOD CULTURE ID PANEL (REFLEXED) - BCID2

## 2024-03-19 LAB — BASIC METABOLIC PANEL WITH GFR
Anion gap: 13 (ref 5–15)
BUN: 18 mg/dL (ref 8–23)
CO2: 21 mmol/L — ABNORMAL LOW (ref 22–32)
Calcium: 8.4 mg/dL — ABNORMAL LOW (ref 8.9–10.3)
Chloride: 103 mmol/L (ref 98–111)
Creatinine, Ser: 0.96 mg/dL (ref 0.44–1.00)
GFR, Estimated: >60 mL/min
Glucose, Bld: 152 mg/dL — ABNORMAL HIGH (ref 70–99)
Potassium: 3.5 mmol/L (ref 3.5–5.1)
Sodium: 137 mmol/L (ref 135–145)

## 2024-03-19 LAB — GLUCOSE, CAPILLARY
Glucose-Capillary: 113 mg/dL — ABNORMAL HIGH (ref 70–99)
Glucose-Capillary: 124 mg/dL — ABNORMAL HIGH (ref 70–99)
Glucose-Capillary: 85 mg/dL (ref 70–99)
Glucose-Capillary: 187 mg/dL — ABNORMAL HIGH (ref 70–99)

## 2024-03-19 LAB — CBC
HCT: 21.5 % — ABNORMAL LOW (ref 36.0–46.0)
Hemoglobin: 6.3 g/dL — CL (ref 12.0–15.0)
MCH: 23.4 pg — ABNORMAL LOW (ref 26.0–34.0)
MCHC: 29.3 g/dL — ABNORMAL LOW (ref 30.0–36.0)
MCV: 79.9 fL — ABNORMAL LOW (ref 80.0–100.0)
Platelets: 319 K/uL (ref 150–400)
RBC: 2.69 MIL/uL — ABNORMAL LOW (ref 3.87–5.11)
RDW: 15.6 % — ABNORMAL HIGH (ref 11.5–15.5)
WBC: 12.7 K/uL — ABNORMAL HIGH (ref 4.0–10.5)
nRBC: 0 % (ref 0.0–0.2)

## 2024-03-19 LAB — HEMOGLOBIN AND HEMATOCRIT, BLOOD
HCT: 24.9 % — ABNORMAL LOW (ref 36.0–46.0)
Hemoglobin: 7.7 g/dL — ABNORMAL LOW (ref 12.0–15.0)

## 2024-03-19 LAB — PREPARE RBC (CROSSMATCH)

## 2024-03-19 LAB — PHOSPHORUS: Phosphorus: 4 mg/dL (ref 2.5–4.6)

## 2024-03-19 LAB — MAGNESIUM: Magnesium: 2.3 mg/dL (ref 1.7–2.4)

## 2024-03-19 LAB — VITAMIN B12: Vitamin B-12: 2598 pg/mL — ABNORMAL HIGH (ref 180–914)

## 2024-03-19 LAB — IRON AND TIBC
Iron: <10 ug/dL — ABNORMAL LOW (ref 28–170)
TIBC: 357 ug/dL (ref 250–450)

## 2024-03-19 LAB — FERRITIN: Ferritin: 52 ng/mL (ref 11–307)

## 2024-03-19 MED ORDER — SODIUM CHLORIDE 0.9 % IV SOLN: INTRAVENOUS | Status: AC

## 2024-03-19 MED ORDER — SODIUM CHLORIDE 0.9 % IV SOLN
2.0000 g | INTRAVENOUS | Status: DC
  Administered 2024-03-20 – 2024-03-21 (×2): 2 g via INTRAVENOUS
  Filled 2024-03-19 (×2): qty 20

## 2024-03-19 MED ORDER — MELATONIN 5 MG PO TABS
5.0000 mg | ORAL_TABLET | Freq: Every evening | ORAL | Status: AC | PRN
  Administered 2024-03-19: 5 mg via ORAL
  Filled 2024-03-19: qty 1

## 2024-03-19 MED ORDER — SODIUM CHLORIDE 0.9 % IV SOLN
1.0000 g | Freq: Once | INTRAVENOUS | Status: AC
  Administered 2024-03-19: 1 g via INTRAVENOUS
  Filled 2024-03-19 (×2): qty 10

## 2024-03-19 MED ORDER — SODIUM CHLORIDE 0.9% IV SOLUTION: Freq: Once | INTRAVENOUS | Status: AC

## 2024-03-19 NOTE — Progress Notes (Signed)
 OT Cancellation Note  Patient Details Name: Misty Cervantes MRN: [id number] DOB: 02/09/1946   Cancelled Treatment:    Reason Eval/Treat Not Completed: Medical issues which prohibited therapy  Patient to receive blood products this day. Will continue to follow for OT evaluation as soon as able.   Amro Winebarger OT/L Acute Rehabilitation Department  [phone]    03/19/2024, 9:50 AM

## 2024-03-19 NOTE — Assessment & Plan Note (Addendum)
"   Continue simvastatin          "

## 2024-03-19 NOTE — Progress Notes (Signed)
 PHARMACY - PHYSICIAN COMMUNICATION CRITICAL VALUE ALERT - BLOOD CULTURE IDENTIFICATION (BCID)  Misty Cervantes is an 79 y.o. female who presented to [facility] on 03/18/2024 with a chief complaint of altered mental status.   Assessment:  Recent history of dysuria.  Urine cxt pending, BCx: GNR.  BCID: Ecoli, no resistance detected.   Name of physician (or Provider) Contacted: Dr Barbarann  Current antibiotics:  Cefepime  2g x1 on 1/4 at 1730 Ceftriaxone  1g IV q24h started 1/5 at ~5am  Changes to prescribed antibiotics recommended:  Patient is on recommended antibiotics - No changes needed Increase to ceftriaxone  2g IV daily   Results for orders placed or performed during the hospital encounter of 03/18/24  Blood Culture ID Panel (Reflexed) (Collected: 03/18/2024  5:08 PM)  Result Value Ref Range   Enterococcus faecalis NOT DETECTED NOT DETECTED   Enterococcus Faecium NOT DETECTED NOT DETECTED   Listeria monocytogenes NOT DETECTED NOT DETECTED   Staphylococcus species NOT DETECTED NOT DETECTED   Staphylococcus aureus (BCID) NOT DETECTED NOT DETECTED   Staphylococcus epidermidis NOT DETECTED NOT DETECTED   Staphylococcus lugdunensis NOT DETECTED NOT DETECTED   Streptococcus species NOT DETECTED NOT DETECTED   Streptococcus agalactiae NOT DETECTED NOT DETECTED   Streptococcus pneumoniae NOT DETECTED NOT DETECTED   Streptococcus pyogenes NOT DETECTED NOT DETECTED   A.calcoaceticus-baumannii NOT DETECTED NOT DETECTED   Bacteroides fragilis NOT DETECTED NOT DETECTED   Enterobacterales DETECTED (A) NOT DETECTED   Enterobacter cloacae complex NOT DETECTED NOT DETECTED   Escherichia coli DETECTED (A) NOT DETECTED   Klebsiella aerogenes NOT DETECTED NOT DETECTED   Klebsiella oxytoca NOT DETECTED NOT DETECTED   Klebsiella pneumoniae NOT DETECTED NOT DETECTED   Proteus species NOT DETECTED NOT DETECTED   Salmonella species NOT DETECTED NOT DETECTED   Serratia marcescens NOT DETECTED NOT DETECTED    Haemophilus influenzae NOT DETECTED NOT DETECTED   Neisseria meningitidis NOT DETECTED NOT DETECTED   Pseudomonas aeruginosa NOT DETECTED NOT DETECTED   Stenotrophomonas maltophilia NOT DETECTED NOT DETECTED   Candida albicans NOT DETECTED NOT DETECTED   Candida auris NOT DETECTED NOT DETECTED   Candida glabrata NOT DETECTED NOT DETECTED   Candida krusei NOT DETECTED NOT DETECTED   Candida parapsilosis NOT DETECTED NOT DETECTED   Candida tropicalis NOT DETECTED NOT DETECTED   Cryptococcus neoformans/gattii NOT DETECTED NOT DETECTED   CTX-M ESBL NOT DETECTED NOT DETECTED   Carbapenem resistance IMP NOT DETECTED NOT DETECTED   Carbapenem resistance KPC NOT DETECTED NOT DETECTED   Carbapenem resistance NDM NOT DETECTED NOT DETECTED   Carbapenem resist OXA 48 LIKE NOT DETECTED NOT DETECTED   Carbapenem resistance VIM NOT DETECTED NOT DETECTED    Wanda Hasting PharmD, BCPS [facility] main pharmacy [phone] 03/19/2024 7:21 AM

## 2024-03-19 NOTE — Assessment & Plan Note (Addendum)
 In the setting of acute illness and dehydration PT/OT consulted Fall precautions

## 2024-03-19 NOTE — Assessment & Plan Note (Addendum)
 Patient with hx of UTIs presented with generalized weakness, nausea, fevers and mild confusion (resolved) Urinalysis shows signs of infection, although she has not been having urinary symptoms other than frequency Met sepsis criteria with tachycardia, fever, tachypnea and evidence of urinary infection Start IV Rocephin  Continue IV hydration with IV LR @ 150cc/hr BCID + E coli Blood cultures pending  Urine culture pending

## 2024-03-19 NOTE — Assessment & Plan Note (Addendum)
-  Appears to be stable at this time -Attempt to avoid nephrotoxic medications -Recheck BMP in AM  

## 2024-03-19 NOTE — Assessment & Plan Note (Addendum)
 BP initially elevated with SBP in the 160s to 180s, improved to the 130-140s Continue losartan  IV hydralazine  as needed for SBP > 180

## 2024-03-19 NOTE — Progress Notes (Signed)
 " Progress Note   Patient: Misty Cervantes FMW:[id number] DOB: Nov 27, 1945 DOA: 03/18/2024     1 DOS: the patient was seen and examined on 03/19/2024   Brief hospital course: 78yo with h/o T2DM, HTN, HLD, and stage 3a CKD who presented on 1/4 with weakness, nausea, and confusion.  Fever to 102.6 with tachycardia, no identified source of sepsis but possibly UTI.  Started on Vancomycin /Cefepime /Metronidazole .  Hgb 7.6 on arrival, 6.3 this AM.  Transfusing 1 unit PRBC.   Assessment & Plan Sepsis secondary to UTI (HCC) E coli bacteremia Patient with hx of UTIs presented with generalized weakness, nausea, fevers and mild confusion (resolved) Urinalysis shows signs of infection, although she has not been having urinary symptoms other than frequency Met sepsis criteria with tachycardia, fever, tachypnea and evidence of urinary infection Start IV Rocephin  Continue IV hydration with IV LR @ 150cc/hr BCID + E coli Blood cultures pending  Urine culture pending Microcytic anemia Hgb down to 7.6 on presentation with no recent comparison Patient denies any melena, hematochezia, hematuria, hemoptysis or bruises Repeat Hgb 6.3; possibly consumptive in nature Transfused 1 unit PRBC Low iron , normal ferritin Trend CBC, transfuse for hgb < 7 Consider GI consult if she has persistent anemia and/or apparent onset of bleeding Generalized weakness In the setting of acute illness and dehydration PT/OT consulted Fall precautions QT prolongation EKG on admission showed prolonged QTc of 514 Avoid QTc prolonging meds and check electrolytes Continue telemetry for now Type 2 diabetes mellitus with hyperglycemia, without long-term current use of insulin  (HCC) Uncontrolled diabetes with last A1c of 10.7 2 weeks ago Blood glucose of 237 on admission Home regimen includes glipizide , metformin  and Jardiance , not on insulin  Hold home medications SSI with meals, CBG monitoring with carb modified diet Discussed with  patient and granddaughters about Jardiance  likely increasing her risk of further UTIs, advised them to discuss with her PCP about discontinuing Jardiance  and possibly initiating insulin  in the outpatient. Hypertension associated with diabetes (HCC) BP initially elevated with SBP in the 160s to 180s, improved to the 130-140s Continue losartan  IV hydralazine  as needed for SBP > 180 Hyperlipidemia associated with type 2 diabetes mellitus (HCC) Continue simvastatin  Chronic kidney disease, stage 3a (HCC) Appears to be stable at this time Attempt to avoid nephrotoxic medications Recheck BMP in AM       Consultants: PT OT  Procedures: None  Antibiotics: Cefepime  x 1 Ceftriaxone  1/5- Metronidazole  x 1 Vancomycin  x 1  30 Day Unplanned Readmission Risk Score    Flowsheet Row ED to Hosp-Admission (Current) from 03/18/2024 in [facility]  30 Day Unplanned Readmission Risk Score (%) 13.59 Filed at 03/19/2024 0400    This score is the patient's risk of an unplanned readmission within 30 days of being discharged (0 -100%). The score is based on dignosis, age, lab data, medications, orders, and past utilization.   Low:  0-14.9   Medium: 15-21.9   High: 22-29.9   Extreme: 30 and above           Subjective: Feeling a bit better.  No longer febrile.  Less confused.  Family present at bedside.   Objective: Vitals:   03/19/24 1011 03/19/24 1223  BP: 129/61 137/68  Pulse: 85 91  Resp: 17 18  Temp: 98.2 F (36.8 C) 98 F (36.7 C)  SpO2: 96% 97%    Intake/Output Summary (Last 24 hours) at 03/19/2024 1329 Last data filed at 03/19/2024 1223 Gross per 24 hour  Intake 4082.5  ml  Output 1150 ml  Net 2932.5 ml   Filed Weights   03/18/24 1615  Weight: 66.2 kg    Exam:  General:  Appears calm and comfortable and is in NAD Eyes:  normal lids, iris ENT:  grossly normal hearing, lips & tongue, mmm Cardiovascular:  RRR. No LE edema.  Respiratory:   CTA bilaterally  with no wheezes/rales/rhonchi.  Normal respiratory effort. Abdomen:  soft, mild diffuse TTP, ND Skin:  no rash or induration seen on limited exam Musculoskeletal:  grossly normal tone BUE/BLE, good ROM, no bony abnormality Psychiatric:  grossly normal mood and affect, speech fluent and appropriate, AOx3 Neurologic:  CN 2-12 grossly intact, moves all extremities in coordinated fashion  Data Reviewed: I have reviewed the patient's lab results since admission.  Pertinent labs for today include:  CO2 21 Glucose 152  Iron <10 WBC 12.7 Hgb 6.3, down from 7.6 UA: >500 glucose, 5 ketones, moderate LE, 30 protein, many bacteria Blood cultures pending Urine culture pending   Family Communication: Granddaughters were present  Mobility: PT/OT Consulted      Code Status: Full Code    Disposition: Status is: Inpatient Remains inpatient appropriate because: ongoing monitoring     Time spent: 50 minutes  Unresulted Labs (From admission, onward)     Start     Ordered   03/20/24 0500  CBC  Tomorrow morning,   R        03/19/24 1329   03/20/24 0500  Basic metabolic panel with GFR  Tomorrow morning,   R        03/19/24 1329   03/18/24 1732  Urine Culture  Once,   R        03/18/24 1732   03/18/24 1633  Blood Culture (routine x 2)  (Undifferentiated presentation (screening labs and basic nursing orders))  BLOOD CULTURE X 2,   STAT      03/18/24 1633             Author: Delon Herald, MD 03/19/2024 1:29 PM  For on call review [email or web address].            "

## 2024-03-19 NOTE — Progress Notes (Signed)
 NP Lynwood Kipper was secured chatted because patient is c/o nausea, no prn ordered.

## 2024-03-19 NOTE — Assessment & Plan Note (Addendum)
 Uncontrolled diabetes with last A1c of 10.7 2 weeks ago Blood glucose of 237 on admission Home regimen includes glipizide , metformin  and Jardiance , not on insulin  Hold home medications SSI with meals, CBG monitoring with carb modified diet Discussed with patient and granddaughters about Jardiance  likely increasing her risk of further UTIs, advised them to discuss with her PCP about discontinuing Jardiance  and possibly initiating insulin  in the outpatient.

## 2024-03-19 NOTE — Assessment & Plan Note (Signed)
 Patient with hx of UTIs presented with generalized weakness, nausea, fevers and mild confusion (resolved) Urinalysis shows signs of infection, although she has not been having urinary symptoms other than frequency Met sepsis criteria with tachycardia, fever, tachypnea and evidence of urinary infection Start IV Rocephin  Continue IV hydration with IV LR @ 150cc/hr BCID + E coli Blood cultures pending  Urine culture pending

## 2024-03-19 NOTE — Progress Notes (Signed)
 PT Cancellation Note  Patient Details Name: Misty Cervantes MRN: [id number] DOB: 02/23/1946   Cancelled Treatment:    Reason Eval/Treat Not Completed: Medical issues which prohibited therapy Toget  blood products this AM. Will check back another time. Darice Potters PT Acute Rehabilitation Services Office [phone]   Potters Darice Norris 03/19/2024, 9:46 AM

## 2024-03-19 NOTE — Assessment & Plan Note (Addendum)
 EKG on admission showed prolonged QTc of 514 Avoid QTc prolonging meds and check electrolytes Continue telemetry for now

## 2024-03-19 NOTE — Assessment & Plan Note (Addendum)
 Hgb down to 7.6 on presentation with no recent comparison Patient denies any melena, hematochezia, hematuria, hemoptysis or bruises Repeat Hgb 6.3; possibly consumptive in nature Transfused 1 unit PRBC Low iron, normal ferritin Trend CBC, transfuse for hgb < 7 Consider GI consult if she has persistent anemia and/or apparent onset of bleeding

## 2024-03-19 NOTE — Hospital Course (Addendum)
 78yo with h/o T2DM, HTN, HLD, and stage 3a CKD who presented on 1/4 with weakness, nausea, and confusion.  Fever to 102.6 with tachycardia, no identified source of sepsis but possibly UTI.  Started on Vancomycin /Cefepime /Metronidazole .  Hgb 7.6 on arrival, 6.3 this AM.  Transfusing 1 unit PRBC.

## 2024-03-19 NOTE — Progress Notes (Signed)
 Sent secure message of pt's current lab values that were called to this nurse this morning, to J. Blondie, NP: Hgb 6.3 and Hct-21.5.  He has acknowledged message and states he will look at this further. New orders received.

## 2024-03-20 DIAGNOSIS — N390 Urinary tract infection, site not specified: Secondary | ICD-10-CM | POA: Diagnosis not present

## 2024-03-20 DIAGNOSIS — G3184 Mild cognitive impairment, so stated: Secondary | ICD-10-CM | POA: Insufficient documentation

## 2024-03-20 DIAGNOSIS — A419 Sepsis, unspecified organism: Secondary | ICD-10-CM | POA: Diagnosis not present

## 2024-03-20 LAB — CBC
HCT: 25.8 % — ABNORMAL LOW (ref 36.0–46.0)
Hemoglobin: 7.9 g/dL — ABNORMAL LOW (ref 12.0–15.0)
MCH: 24.5 pg — ABNORMAL LOW (ref 26.0–34.0)
MCHC: 30.6 g/dL (ref 30.0–36.0)
MCV: 80.1 fL (ref 80.0–100.0)
Platelets: 315 K/uL (ref 150–400)
RBC: 3.22 MIL/uL — ABNORMAL LOW (ref 3.87–5.11)
RDW: 15.1 % (ref 11.5–15.5)
WBC: 8.4 K/uL (ref 4.0–10.5)
nRBC: 0.2 % (ref 0.0–0.2)

## 2024-03-20 LAB — BASIC METABOLIC PANEL WITH GFR
Anion gap: 10 (ref 5–15)
BUN: 18 mg/dL (ref 8–23)
CO2: 21 mmol/L — ABNORMAL LOW (ref 22–32)
Calcium: 8.2 mg/dL — ABNORMAL LOW (ref 8.9–10.3)
Chloride: 107 mmol/L (ref 98–111)
Creatinine, Ser: 0.8 mg/dL (ref 0.44–1.00)
GFR, Estimated: >60 mL/min
Glucose, Bld: 99 mg/dL (ref 70–99)
Potassium: 3.1 mmol/L — ABNORMAL LOW (ref 3.5–5.1)
Sodium: 137 mmol/L (ref 135–145)

## 2024-03-20 LAB — URINE CULTURE: Culture: >=100000 — AB

## 2024-03-20 LAB — GLUCOSE, CAPILLARY
Glucose-Capillary: 96 mg/dL (ref 70–99)
Glucose-Capillary: 119 mg/dL — ABNORMAL HIGH (ref 70–99)
Glucose-Capillary: 204 mg/dL — ABNORMAL HIGH (ref 70–99)
Glucose-Capillary: 155 mg/dL — ABNORMAL HIGH (ref 70–99)

## 2024-03-20 LAB — TYPE AND SCREEN
ABO/RH(D): O POS
Antibody Screen: NEGATIVE
Unit division: 0

## 2024-03-20 LAB — BPAM RBC
Blood Product Expiration Date: 202602022359
ISSUE DATE / TIME: 202601050949
Unit Type and Rh: 5100

## 2024-03-20 MED ORDER — ONDANSETRON HCL 4 MG/2ML IJ SOLN
2.0000 mg | Freq: Once | INTRAMUSCULAR | Status: DC
  Filled 2024-03-20: qty 2

## 2024-03-20 MED ORDER — POTASSIUM CHLORIDE CRYS ER 20 MEQ PO TBCR
40.0000 meq | EXTENDED_RELEASE_TABLET | Freq: Once | ORAL | Status: AC
  Administered 2024-03-20: 40 meq via ORAL
  Filled 2024-03-20: qty 2

## 2024-03-20 NOTE — Progress Notes (Signed)
 " Progress Note   Patient: Misty Cervantes FMW:[id number] DOB: Dec 17, 1945 DOA: 03/18/2024     2 DOS: the patient was seen and examined on 03/20/2024   Brief hospital course: 78yo with h/o T2DM, HTN, HLD, and stage 3a CKD who presented on 1/4 with weakness, nausea, and confusion.  Fever to 102.6 with tachycardia, no identified source of sepsis but possibly UTI.  Started on Vancomycin /Cefepime /Metronidazole .  Hgb 7.6 on arrival, 6.3 this AM.  Transfusing 1 unit PRBC.    Assessment & Plan Sepsis secondary to UTI (HCC) E coli bacteremia Patient with hx of UTIs presented with generalized weakness, nausea, fevers and mild confusion (resolved) Urinalysis shows signs of infection, although she has not been having urinary symptoms other than frequency Met sepsis criteria with tachycardia, fever, tachypnea and evidence of urinary infection Start IV Rocephin  Continue IV hydration with IV LR @ 150cc/hr BCID + E coli Blood culture x 1 with  E coli, x 1 negative Urine culture >100k colonies E coli, resistant to Ampicillin Will repeat blood cultures to ensure clearance Microcytic anemia Hgb down to 7.6 on presentation with no recent comparison Patient denies any melena, hematochezia, hematuria, hemoptysis or bruises Repeat Hgb 6.3; possibly consumptive in nature Transfused 1 unit PRBC Low iron, normal ferritin Repeat CBC s/p transfusion is 7.9 Repeat CBC in AM Transfuse for hgb < 7 Consider GI consult if she has persistent anemia and/or apparent onset of bleeding Generalized weakness In the setting of acute illness and dehydration PT/OT consulted Fall precautions QT prolongation EKG on admission showed prolonged QTc of 514 Avoid QTc prolonging meds and check electrolytes Repeat EKG with resolution Type 2 diabetes mellitus with hyperglycemia, without long-term current use of insulin  (HCC) Uncontrolled diabetes with last A1c of 10.7 2 weeks ago Blood glucose of 237 on admission Home regimen  includes glipizide , metformin  and Jardiance , not on insulin  Hold home medications SSI with meals, CBG monitoring with carb modified diet Discussed with patient and granddaughters about Jardiance  likely increasing her risk of further UTIs, advised them to discuss with her PCP about discontinuing Jardiance  and possibly initiating insulin  in the outpatient. Hypertension associated with diabetes (HCC) BP initially elevated with SBP in the 160s to 180s, improved to the 130-140s Continue losartan  IV hydralazine  as needed for SBP > 180 Hyperlipidemia associated with type 2 diabetes mellitus (HCC) Continue simvastatin  Chronic kidney disease, stage 3a (HCC) Appears to be stable at this time Attempt to avoid nephrotoxic medications Recheck BMP in AM  MCI (mild cognitive impairment) Family has concerns about developing dementia at home Had some sundowning last night Outpatient neurology encouraged for neuropsychiatric testing Will add delirium precautions      Consultants: PT OT   Procedures: None   Antibiotics: Cefepime  x 1 Ceftriaxone  1/5- Metronidazole  x 1 Vancomycin  x 1  30 Day Unplanned Readmission Risk Score    Flowsheet Row ED to Hosp-Admission (Current) from 03/18/2024 in [facility]  30 Day Unplanned Readmission Risk Score (%) 15.25 Filed at 03/20/2024 0400    This score is the patient's risk of an unplanned readmission within 30 days of being discharged (0 -100%). The score is based on dignosis, age, lab data, medications, orders, and past utilization.   Low:  0-14.9   Medium: 15-21.9   High: 22-29.9   Extreme: 30 and above           Subjective: Feeling better but concerned about being a pin cushion.  Had some overnight delirium which has also been  happening at home, better this AM.   Objective: Vitals:   03/20/24 0638 03/20/24 1320  BP: 133/69 (!) 148/72  Pulse: 84 94  Resp: 16 18  Temp: 98.2 F (36.8 C) 98.3 F (36.8 C)  SpO2: 94% 100%     Intake/Output Summary (Last 24 hours) at 03/20/2024 1342 Last data filed at 03/20/2024 1000 Gross per 24 hour  Intake 2468.07 ml  Output 3050 ml  Net -581.93 ml   Filed Weights   03/18/24 1615  Weight: 66.2 kg    Exam:  General:  Appears calm and comfortable and is in NAD, on RA Eyes:  normal lids, iris ENT:  grossly normal hearing, lips & tongue, mmm Cardiovascular:  RRR. No LE edema.  Respiratory:   CTA bilaterally with no wheezes/rales/rhonchi.  Normal respiratory effort. Abdomen:  soft, NT, ND Skin:  no rash or induration seen on limited exam Musculoskeletal:  grossly normal tone BUE/BLE, good ROM, no bony abnormality Psychiatric:  grossly normal mood and affect, speech fluent and appropriate, AOx3 Neurologic:  CN 2-12 grossly intact, moves all extremities in coordinated fashion  Data Reviewed: I have reviewed the patient's lab results since admission.  Pertinent labs for today include:   K+ 3.1 CO2 21 WBC 8.4, improved Hgb 7.9, improved from 6.3 Urine culture culture >100k colonies E coli, sensitivities Blood cultures + E coli     Family Communication: Granddaughters were presernt  Mobility: PT/OT Consulted and are recommending - Home Health Pt1/08/2024 1241    Code Status: Full Code    Disposition: Status is: Inpatient Remains inpatient appropriate because: ongoing management     Time spent: 50 minutes  Unresulted Labs (From admission, onward)     Start     Ordered   03/21/24 0500  CBC  Tomorrow morning,   R        03/20/24 0758   03/21/24 0500  Basic metabolic panel with GFR  Tomorrow morning,   R        03/20/24 0758   03/20/24 1335  Culture, blood (Routine X 2) w Reflex to ID Panel  BLOOD CULTURE X 2,   R      03/20/24 1334             Author: Delon Herald, MD 03/20/2024 1:42 PM  For on call review [email or web address]            "

## 2024-03-20 NOTE — Assessment & Plan Note (Addendum)
 Patient with hx of UTIs presented with generalized weakness, nausea, fevers and mild confusion (resolved) Urinalysis shows signs of infection, although she has not been having urinary symptoms other than frequency Met sepsis criteria with tachycardia, fever, tachypnea and evidence of urinary infection Start IV Rocephin  Continue IV hydration with IV LR @ 150cc/hr BCID + E coli Blood culture x 1 with  E coli, x 1 negative Urine culture >100k colonies E coli, resistant to Ampicillin Will repeat blood cultures to ensure clearance

## 2024-03-20 NOTE — Assessment & Plan Note (Addendum)
 Hgb down to 7.6 on presentation with no recent comparison Patient denies any melena, hematochezia, hematuria, hemoptysis or bruises Repeat Hgb 6.3; possibly consumptive in nature Transfused 1 unit PRBC Low iron, normal ferritin Repeat CBC s/p transfusion is 7.9 Repeat CBC in AM Transfuse for hgb < 7 Consider GI consult if she has persistent anemia and/or apparent onset of bleeding

## 2024-03-20 NOTE — Assessment & Plan Note (Addendum)
 In the setting of acute illness and dehydration PT/OT consulted Fall precautions

## 2024-03-20 NOTE — Assessment & Plan Note (Signed)
 Family has concerns about developing dementia at home Had some sundowning last night Outpatient neurology encouraged for neuropsychiatric testing Will add delirium precautions

## 2024-03-20 NOTE — Plan of Care (Signed)
  Problem: Education: Goal: Knowledge of General Education information will improve Description: Including pain rating scale, medication(s)/side effects and non-pharmacologic comfort measures Outcome: Progressing   Problem: Pain Managment: Goal: General experience of comfort will improve and/or be controlled Outcome: Progressing

## 2024-03-20 NOTE — Assessment & Plan Note (Addendum)
 Uncontrolled diabetes with last A1c of 10.7 2 weeks ago Blood glucose of 237 on admission Home regimen includes glipizide , metformin  and Jardiance , not on insulin  Hold home medications SSI with meals, CBG monitoring with carb modified diet Discussed with patient and granddaughters about Jardiance  likely increasing her risk of further UTIs, advised them to discuss with her PCP about discontinuing Jardiance  and possibly initiating insulin  in the outpatient.

## 2024-03-20 NOTE — Assessment & Plan Note (Addendum)
-  Appears to be stable at this time -Attempt to avoid nephrotoxic medications -Recheck BMP in AM  

## 2024-03-20 NOTE — Assessment & Plan Note (Addendum)
"   Continue simvastatin          "

## 2024-03-20 NOTE — Assessment & Plan Note (Addendum)
 BP initially elevated with SBP in the 160s to 180s, improved to the 130-140s Continue losartan  IV hydralazine  as needed for SBP > 180

## 2024-03-20 NOTE — Assessment & Plan Note (Addendum)
 EKG on admission showed prolonged QTc of 514 Avoid QTc prolonging meds and check electrolytes Repeat EKG with resolution

## 2024-03-20 NOTE — Evaluation (Signed)
 Physical Therapy Evaluation Patient Details Name: Misty Cervantes MRN: [id number] DOB: 12-14-1945 Today's Date: 03/20/2024  History of Present Illness  Misty Cervantes is a 79 y.o. female who presented to the ED for evaluation of generalized weakness, nausea and confusion dizziness, fever, tachycardic, initiated sepsis protocol in ED. Dx- UTI/sepsis  PMH: r T2DM, HTN, HLD, osteoarthritis, CKD 3A, spinal stenosis and insomnia, THA, TKA  Clinical Impression  Pt resting in bed with granddaughters present for PT eval. PTA, pt was IND with all mobility, driving and no history of falls; she was having increased weakness in the week leading up to admission. Pt currently requires CGA and RW for all mobility with decreased muscular endurance and activity tolerance. She appears close to her functional baseline. She will benefit from HHPT following discharge to continue improving her strength, endurance and overall functional independence.         If plan is discharge home, recommend the following: A little help with walking and/or transfers;Assist for transportation   Can travel by private vehicle        Equipment Recommendations None recommended by PT  Recommendations for Other Services       Functional Status Assessment Patient has had a recent decline in their functional status and demonstrates the ability to make significant improvements in function in a reasonable and predictable amount of time.     Precautions / Restrictions Precautions Precautions: None Recall of Precautions/Restrictions: Intact Restrictions Weight Bearing Restrictions Per Provider Order: No      Mobility  Bed Mobility Overal bed mobility: Needs Assistance Bed Mobility: Supine to Sit     Supine to sit: Contact guard     General bed mobility comments: guarding assist while moving to sit up and scoot to EOB    Transfers Overall transfer level: Needs assistance Equipment used: Rolling walker (2  wheels) Transfers: Sit to/from Stand Sit to Stand: Contact guard assist           General transfer comment: CGA from EOB with RW, VC for proper hand placement to stand    Ambulation/Gait Ambulation/Gait assistance: Contact guard assist Gait Distance (Feet): 150 Feet Assistive device: Rolling walker (2 wheels) Gait Pattern/deviations: Step-through pattern Gait velocity: decreased     General Gait Details: good balance throughout, VC for closer proximity to device, HR increased to 104-107 with ambulation, no SOB or dizziness reported  Stairs            Wheelchair Mobility     Tilt Bed    Modified Rankin (Stroke Patients Only)       Balance Overall balance assessment: Needs assistance Sitting-balance support: Feet supported Sitting balance-Leahy Scale: Good     Standing balance support: During functional activity Standing balance-Leahy Scale: Fair                               Pertinent Vitals/Pain Pain Assessment Pain Assessment: No/denies pain    Home Living Family/patient expects to be discharged to:: Private residence Living Arrangements: Other (Comment) (grandson lives with her, grandaughters live close) Available Help at Discharge: Family;Available PRN/intermittently (grandson is in school) Type of Home: Apartment Home Access: Level entry       Home Layout: One level Home Equipment: Agricultural Consultant (2 wheels);Cane - single point;BSC/3in1      Prior Function Prior Level of Function : Independent/Modified Independent;Driving             Mobility Comments: IND no  DME, drives ADLs Comments: IND, assists with cooking and cleaning     Extremity/Trunk Assessment   Upper Extremity Assessment Upper Extremity Assessment: Generalized weakness    Lower Extremity Assessment Lower Extremity Assessment: Generalized weakness    Cervical / Trunk Assessment Cervical / Trunk Assessment: Normal  Communication    Communication Communication: No apparent difficulties    Cognition Arousal: Alert Behavior During Therapy: WFL for tasks assessed/performed   PT - Cognitive impairments: No apparent impairments                         Following commands: Intact       Cueing Cueing Techniques: Verbal cues, Tactile cues     General Comments      Exercises     Assessment/Plan    PT Assessment Patient needs continued PT services  PT Problem List Decreased strength;Decreased activity tolerance;Decreased balance       PT Treatment Interventions Gait training;Functional mobility training    PT Goals (Current goals can be found in the Care Plan section)  Acute Rehab PT Goals Patient Stated Goal: get stronger and go home PT Goal Formulation: With patient Time For Goal Achievement: 04/03/24 Potential to Achieve Goals: Good    Frequency Min 3X/week     Co-evaluation               AM-PAC PT 6 Clicks Mobility  Outcome Measure Help needed turning from your back to your side while in a flat bed without using bedrails?: A Little Help needed moving from lying on your back to sitting on the side of a flat bed without using bedrails?: A Little Help needed moving to and from a bed to a chair (including a wheelchair)?: A Little Help needed standing up from a chair using your arms (e.g., wheelchair or bedside chair)?: A Little Help needed to walk in hospital room?: A Little Help needed climbing 3-5 steps with a railing? : A Little 6 Click Score: 18    End of Session Equipment Utilized During Treatment: Gait belt Activity Tolerance: Patient tolerated treatment well Patient left: in chair;with family/visitor present;with call bell/phone within reach Nurse Communication: Mobility status;Other (comment) (vitals, up to bathroom) PT Visit Diagnosis: Muscle weakness (generalized) (M62.81);Unsteadiness on feet (R26.81)    Time: 8799-8774 PT Time Calculation (min) (ACUTE ONLY): 25  min   Charges:   PT Evaluation $PT Eval Low Complexity: 1 Low PT Treatments $Gait Training: 8-22 mins           Isaiah DEL. Roseanna Koplin, PT, DPT   [employer] 03/20/2024, 12:43 PM

## 2024-03-20 NOTE — Evaluation (Signed)
 Occupational Therapy Evaluation Patient Details Name: Misty Cervantes MRN: [id number] DOB: 01/06/1946 Today's Date: 03/20/2024   History of Present Illness   Misty Cervantes is a 79 y.o. female who presented to the ED for evaluation of generalized weakness, nausea and confusion dizziness, fever, tachycardic, initiated sepsis protocol in ED. Dx- UTI/sepsis  PMH: r T2DM, HTN, HLD, osteoarthritis, CKD 3A, spinal stenosis and insomnia, THA, TKA     Clinical Impressions PTA, patient lives    at a   level with    . Currently, patient presents with deficits outlined below (see OT Problem List for details) most significantly generalized muscle weakness, balance and activity tolerance deficits limiting BADL's and mobility (close S) performance. Recommending HHOT services with family support and assistance upon discharge from acute hospital setting. Patient requires continued Acute care hospital level OT services to progress safety and functional performance and allow for discharge.     If plan is discharge home, recommend the following:   A little help with walking and/or transfers;A little help with bathing/dressing/bathroom;Assistance with cooking/housework;Assist for transportation;Help with stairs or ramp for entrance     Functional Status Assessment   Patient has had a recent decline in their functional status and demonstrates the ability to make significant improvements in function in a reasonable and predictable amount of time.     Equipment Recommendations   None recommended by OT      Precautions/Restrictions   Precautions Precautions: None Recall of Precautions/Restrictions: Intact Restrictions Weight Bearing Restrictions Per Provider Order: No     Mobility Bed Mobility Overal bed mobility: Needs Assistance Bed Mobility: Supine to Sit, Sit to Supine     Supine to sit: Supervision     General bed mobility comments: min cues    Transfers Overall transfer level:  Needs assistance Equipment used: Rolling walker (2 wheels) Transfers: Sit to/from Stand, Bed to chair/wheelchair/BSC Sit to Stand: Supervision     Step pivot transfers: Supervision     General transfer comment: amb to and from bathroom with RW with min cues for safe navigation      Balance Overall balance assessment: Needs assistance Sitting-balance support: Feet supported Sitting balance-Leahy Scale: Good     Standing balance support: During functional activity Standing balance-Leahy Scale: Fair                             ADL either performed or assessed with clinical judgement   ADL Overall ADL's : Needs assistance/impaired                                       General ADL Comments: Supervision for LB self care and toileting     Vision Baseline Vision/History: 1 Wears glasses;0 No visual deficits Ability to See in Adequate Light: 0 Adequate Patient Visual Report: No change from baseline Vision Assessment?: No apparent visual deficits;Wears glasses for reading            Pertinent Vitals/Pain Pain Assessment Pain Assessment: No/denies pain     Extremity/Trunk Assessment Upper Extremity Assessment Upper Extremity Assessment: Generalized weakness;Right hand dominant   Lower Extremity Assessment Lower Extremity Assessment: Defer to PT evaluation   Cervical / Trunk Assessment Cervical / Trunk Assessment: Normal   Communication Communication Communication: No apparent difficulties   Cognition Arousal: Alert Behavior During Therapy: WFL for tasks assessed/performed Cognition: No apparent impairments  Following commands: Intact       Cueing  General Comments   Cueing Techniques: Verbal cues;Tactile cues  RA with sats 99%           Home Living Family/patient expects to be discharged to:: Private residence Living Arrangements: Other (Comment) (grandson lives with patient and  grand dtrs live nearby) Available Help at Discharge: Family;Available PRN/intermittently;Other (Comment) (grandson in school) Type of Home: Apartment Home Access: Level entry     Home Layout: One level     Bathroom Shower/Tub: Chief Strategy Officer: Standard     Home Equipment: Agricultural Consultant (2 wheels);BSC/3in1;Cane - single point;Adaptive equipment Adaptive Equipment: Reacher        Prior Functioning/Environment Prior Level of Function : Independent/Modified Independent;Driving             Mobility Comments: IND no DME, drives ADLs Comments: IND, assists with cooking and cleaning            OT Goals(Current goals can be found in the care plan section)   Acute Rehab OT Goals Patient Stated Goal: to go home OT Goal Formulation: With patient/family Time For Goal Achievement: 04/03/24 Potential to Achieve Goals: Good ADL Goals Pt Will Perform Lower Body Bathing: with modified independence Pt Will Perform Lower Body Dressing: with modified independence Pt Will Transfer to Toilet: with modified independence Pt Will Perform Toileting - Clothing Manipulation and hygiene: with modified independence   OT Frequency:  Min 1X/week       AM-PAC OT 6 Clicks Daily Activity     Outcome Measure Help from another person eating meals?: None Help from another person taking care of personal grooming?: A Little Help from another person toileting, which includes using toliet, bedpan, or urinal?: A Little Help from another person bathing (including washing, rinsing, drying)?: A Little Help from another person to put on and taking off regular upper body clothing?: A Little Help from another person to put on and taking off regular lower body clothing?: A Little 6 Click Score: 19   End of Session Equipment Utilized During Treatment: Gait belt;Rolling walker (2 wheels) Nurse Communication: Mobility status  Activity Tolerance: Patient tolerated treatment  well Patient left: in bed;with bed alarm set;with call bell/phone within reach;with family/visitor present  OT Visit Diagnosis: Unsteadiness on feet (R26.81)                Time: 1510-1530 OT Time Calculation (min): 20 min Charges:  OT General Charges $OT Visit: 1 Visit OT Evaluation $OT Eval Low Complexity: 1 Low  Joyous Gleghorn OT/L Acute Rehabilitation Department  [phone]  03/20/2024, 4:31 PM

## 2024-03-21 DIAGNOSIS — N390 Urinary tract infection, site not specified: Secondary | ICD-10-CM | POA: Diagnosis not present

## 2024-03-21 DIAGNOSIS — A419 Sepsis, unspecified organism: Secondary | ICD-10-CM | POA: Diagnosis not present

## 2024-03-21 LAB — CBC
HCT: 26.3 % — ABNORMAL LOW (ref 36.0–46.0)
Hemoglobin: 7.9 g/dL — ABNORMAL LOW (ref 12.0–15.0)
MCH: 24.5 pg — ABNORMAL LOW (ref 26.0–34.0)
MCHC: 30 g/dL (ref 30.0–36.0)
MCV: 81.4 fL (ref 80.0–100.0)
Platelets: 351 K/uL (ref 150–400)
RBC: 3.23 MIL/uL — ABNORMAL LOW (ref 3.87–5.11)
RDW: 15.1 % (ref 11.5–15.5)
WBC: 8.9 K/uL (ref 4.0–10.5)
nRBC: 0 % (ref 0.0–0.2)

## 2024-03-21 LAB — CULTURE, BLOOD (ROUTINE X 2): Special Requests: ADEQUATE

## 2024-03-21 LAB — GLUCOSE, CAPILLARY
Glucose-Capillary: 189 mg/dL — ABNORMAL HIGH (ref 70–99)
Glucose-Capillary: 192 mg/dL — ABNORMAL HIGH (ref 70–99)
Glucose-Capillary: 276 mg/dL — ABNORMAL HIGH (ref 70–99)
Glucose-Capillary: 290 mg/dL — ABNORMAL HIGH (ref 70–99)

## 2024-03-21 LAB — BASIC METABOLIC PANEL WITH GFR
Anion gap: 13 (ref 5–15)
BUN: 20 mg/dL (ref 8–23)
CO2: 20 mmol/L — ABNORMAL LOW (ref 22–32)
Calcium: 8.6 mg/dL — ABNORMAL LOW (ref 8.9–10.3)
Chloride: 101 mmol/L (ref 98–111)
Creatinine, Ser: 0.99 mg/dL (ref 0.44–1.00)
GFR, Estimated: 58 mL/min — ABNORMAL LOW
Glucose, Bld: 192 mg/dL — ABNORMAL HIGH (ref 70–99)
Potassium: 4 mmol/L (ref 3.5–5.1)
Sodium: 134 mmol/L — ABNORMAL LOW (ref 135–145)

## 2024-03-21 LAB — OCCULT BLOOD X 1 CARD TO LAB, STOOL: Fecal Occult Bld: POSITIVE — AB

## 2024-03-21 MED ORDER — PANTOPRAZOLE SODIUM 40 MG IV SOLR
40.0000 mg | Freq: Two times a day (BID) | INTRAVENOUS | Status: DC
  Administered 2024-03-21 – 2024-03-23 (×4): 40 mg via INTRAVENOUS
  Filled 2024-03-21 (×4): qty 10

## 2024-03-21 MED ORDER — SULFAMETHOXAZOLE-TRIMETHOPRIM 800-160 MG PO TABS
2.0000 | ORAL_TABLET | Freq: Two times a day (BID) | ORAL | Status: AC
  Administered 2024-03-21 – 2024-03-24 (×7): 2 via ORAL
  Filled 2024-03-21 (×7): qty 2

## 2024-03-21 NOTE — Progress Notes (Addendum)
 " PROGRESS NOTE  Misty Cervantes  DOB: 1945-11-28  PCP: Lucius Krabbe, NP FMW:[id number]  DOA: 03/18/2024  LOS: 3 days  Hospital Day: 4  Subjective: Patient was seen and examined this afternoon. Pleasant elderly Caucasian female.  Sitting up at the edge of the bed.  Not in distress.  No family present. Reported 1 dark black bowel movement earlier today. Chart reviewed Afebrile, heart rate in 90s, blood pressure 130s and 140s, breathing on room air Labs this morning with sodium 134, hemoglobin 7.9  Brief narrative: Misty Cervantes is a 79 y.o. female with PMH significant for T2DM, HTN, HLD, CKD 3a, dysphagia, osteoarthritis, h/o gastric ulcers 1/4, patient presented to ED with complaint of weakness, nausea, and confusion.   Was noted to have fever to 102.6 with tachycardia,  Urinalysis showed hazy straw-colored urine with moderate leukocytes, many bacteria Started on Vancomycin /Cefepime /Metronidazole .  Also Hgb was low at 7.6 on arrival Admitted to [facility] Blood culture and urine culture sent on admission grew E. coli  Assessment and plan: Sepsis secondary to UTI  E coli bacteremia and UTI Patient with hx of UTIs presented with generalized weakness, nausea, fevers and mild confusion (resolved) Urinalysis shows signs of infection, Met sepsis criteria with tachycardia, fever, tachypnea and evidence of urinary infection Blood culture urine culture grew E. Coli Currently on IV Rocephin .  Per pharmacy, E. coli and blood resistant to the facility.  Will transition to oral Bactrim  to complete 7-day course on 1/10. Repeat blood culture was sent on 1/6. WBC count normalized Recent Labs  Lab 03/18/24 1708 03/18/24 1721 03/19/24 0428 03/20/24 0454 03/21/24 0441  WBC 9.6  --  12.7* 8.4 8.9  LATICACIDVEN  --  1.6  --   --   --    Acute on chronic anemia Melena Remote h/o stomach ulcer Baseline hemoglobin from 2023 was 11.7. Hemoglobin at the lowest of 6.3 on 1/5.  Anemia panel  showed low ferritin and low iron level.  Given monitor PRBC transfusion on 1/5. Reported 1 episode of melanotic stool this afternoon. Misty Cervantes GI consulted.  Repeat CBC in a.m. Recent Labs    03/18/24 1708 03/19/24 0428 03/19/24 0431 03/19/24 1631 03/20/24 0454 03/21/24 0441  HGB 7.6* 6.3*  --  7.7* 7.9* 7.9*  MCV 79.9* 79.9*  --   --  80.1 81.4  VITAMINB12  --   --  2,598*  --   --   --   FERRITIN  --   --  52  --   --   --   TIBC  --   --  357  --   --   --   IRON  --   --  <10*  --   --   --    Acute multiple encephalopathy  MCI (mild cognitive impairment) Was confused at presentation likely due to UTI.  Family also has concerns about developing dementia at home Had episodes of sundowning in the hospital. Mental status seems much better today.  Able to have meaningful conversation. Continue delirium precautions Outpatient neurology encouraged for neuropsychiatric testing.  Type 2 diabetes mellitus uncontrolled with hyperglycemia A1c 10.7 on 02/28/2024 PTA meds-glipizide , metformin , Jardiance  Currently only on SSI/Accu-Cheks Recent Labs  Lab 03/20/24 1134 03/20/24 1626 03/20/24 2112 03/21/24 0719 03/21/24 1118  GLUCAP 119* 204* 155* 189* 192*   HTN Blood pressure currently controlled on losartan  50 mg daily  HLD Continue simvastatin  CKD 3A Hypokalemia Mild hyponatremia Creatinine stable at baseline Sodium level mildly low.  Continue to monitor Potassium level improved with replacement Recent Labs  Lab 03/18/24 1708 03/19/24 0428 03/20/24 0454 03/21/24 0441  NA 131* 137 137 134*  K 4.6 3.5 3.1* 4.0  CL 94* 103 107 101  CO2 21* 21* 21* 20*  GLUCOSE 237* 152* 99 192*  BUN 15 18 18 20   CREATININE 1.08* 0.96 0.80 0.99  CALCIUM  9.2 8.4* 8.2* 8.6*  MG  --  2.3  --   --   PHOS  --  4.0  --   --    QT prolongation EKG on admission showed prolonged QTc of 514 Avoid QTc prolonging meds and check electrolytes Repeat EKG 1/6 showed improvement in QTc to 463  ms.  Generalized weakness In the setting of acute illness and dehydration PT/OT consulted.  Home health recommended. Fall precautions    Nutrition Status:         Mobility:   PT Orders: Active   PT Follow up Rec: Home Health Pt1/08/2024 1241    Goals of care   Code Status: Full Code     DVT prophylaxis:  Place and maintain sequential compression device Start: 03/19/24 1332   Antimicrobials: IV Rocephin  Fluid: None Consultants: GI Family Communication: None at bedside.  I called and updated patient's granddaughter this afternoon.  Status: Inpatient Level of care:  Med-Surg   Patient is from: Home Needs to continue in-hospital care: Pending GI consult Anticipated d/c to: In 1 to 2 days Home with home health      Diet:  Diet Order             Diet Carb Modified Room service appropriate? Yes  Diet effective now                   Scheduled Meds:  insulin  aspart  0-15 Units Subcutaneous TID WC   insulin  aspart  0-5 Units Subcutaneous QHS   losartan   50 mg Oral Daily   ondansetron  (ZOFRAN ) IV  2 mg Intravenous Once   pantoprazole  (PROTONIX ) IV  40 mg Intravenous Q12H   pravastatin   40 mg Oral QHS   sulfamethoxazole -trimethoprim   2 tablet Oral Q12H    PRN meds: acetaminophen  **OR** acetaminophen , bisacodyl , hydrALAZINE , melatonin, senna-docusate   Infusions:     Antimicrobials: Anti-infectives (From admission, onward)    Start     Dose/Rate Route Frequency Ordered Stop   03/21/24 2200  sulfamethoxazole -trimethoprim  (BACTRIM  DS) 800-160 MG per tablet 2 tablet        2 tablet Oral Every 12 hours 03/21/24 1448 03/25/24 0959   03/20/24 0600  cefTRIAXone  (ROCEPHIN ) 2 g in sodium chloride  0.9 % 100 mL IVPB  Status:  Discontinued        2 g 200 mL/hr over 30 Minutes Intravenous Every 24 hours 03/19/24 0717 03/21/24 1448   03/19/24 0815  cefTRIAXone  (ROCEPHIN ) 1 g in sodium chloride  0.9 % 100 mL IVPB        1 g 200 mL/hr over 30 Minutes  Intravenous  Once 03/19/24 0717 03/19/24 0930   03/19/24 0500  cefTRIAXone  (ROCEPHIN ) 1 g in sodium chloride  0.9 % 100 mL IVPB  Status:  Discontinued        1 g 200 mL/hr over 30 Minutes Intravenous Every 24 hours 03/18/24 2117 03/19/24 0717   03/18/24 1715  vancomycin  (VANCOREADY) IVPB 1500 mg/300 mL        1,500 mg 150 mL/hr over 120 Minutes Intravenous  Once 03/18/24 1642 03/18/24 2011   03/18/24 1645  ceFEPIme  (  MAXIPIME ) 2 g in sodium chloride  0.9 % 100 mL IVPB        2 g 200 mL/hr over 30 Minutes Intravenous  Once 03/18/24 1640 03/18/24 1800   03/18/24 1645  metroNIDAZOLE  (FLAGYL ) IVPB 500 mg        500 mg 100 mL/hr over 60 Minutes Intravenous  Once 03/18/24 1640 03/18/24 1831   03/18/24 1645  vancomycin  (VANCOCIN ) IVPB 1000 mg/200 mL premix  Status:  Discontinued        1,000 mg 200 mL/hr over 60 Minutes Intravenous  Once 03/18/24 1640 03/18/24 1642       Objective: Vitals:   03/21/24 0533 03/21/24 1330  BP: 139/65 (!) 145/81  Pulse: 98 99  Resp: 18 18  Temp: 99 F (37.2 C) 98.6 F (37 C)  SpO2: 97% 100%    Intake/Output Summary (Last 24 hours) at 03/21/2024 1637 Last data filed at 03/21/2024 1000 Gross per 24 hour  Intake 550 ml  Output 1300 ml  Net -750 ml   Filed Weights   03/18/24 1615  Weight: 66.2 kg   Weight change:  Body mass index is 29.49 kg/m.   Physical Exam: General exam: Pleasant, elderly Caucasian female. Skin: No rashes, lesions or ulcers. HEENT: Atraumatic, normocephalic, no obvious bleeding Lungs: Clear to auscultation bilaterally,  CVS: S1, S2, no murmur,   GI/Abd: Soft, nontender, nondistended, bowel sound present,   CNS: Alert, awake, ordered x 3 Psychiatry: Mood appropriate Extremities: No pedal edema, no calf tenderness,   Data Review: I have personally reviewed the laboratory data and studies available.  F/u labs ordered Unresulted Labs (From admission, onward)     Start     Ordered   03/22/24 0500  Basic metabolic panel with  GFR  Tomorrow morning,   R        03/21/24 1439   03/22/24 0500  CBC with Differential/Platelet  Tomorrow morning,   R        03/21/24 1439            Signed, Chapman Rota, MD [facility] 03/21/2024  "

## 2024-03-21 NOTE — Plan of Care (Signed)
   Problem: Education: Goal: Knowledge of General Education information will improve Description Including pain rating scale, medication(s)/side effects and non-pharmacologic comfort measures Outcome: Progressing   Problem: Health Behavior/Discharge Planning: Goal: Ability to manage health-related needs will improve Outcome: Progressing

## 2024-03-21 NOTE — Consult Note (Addendum)
 "  Referring Provider: Dr. Chapman Rota Primary Care Physician:  Lucius Krabbe, NP Primary Gastroenterologist:  [facility] in 2013.  Reason for Consultation: Dark stools, anemia  HPI: Misty Cervantes is a 79 y.o. female with a past medical history of hyperlipidemia, diabetes mellitus type 2, CKD stage III, gastric ulcer in 2013 and hyperplastic rectal polyps.  She presented to the ED via EMS secondary to generalized weakness and altered mental status.  Labs in the ED showed a WBC count of 9.4.  Hemoglobin 7.6 (baseline Hg 11.7-12 two years ago).  MCV 79.9.  Platelets 340.  INR 1.1.  Sodium 131.  Potassium 4.6.  BUN 15.  Creatinine 1.08.   Chest x-ray was negative.  CT was negative for acute intracranial abnormality. Blood cultures positive for Enterobacter rallies and E. coli urine cultures positive for E. coli.  Initially received Vancomycin , Cefepime  and Metronidazole  IV.  Now on Bactrim  DS twice daily secondary to urosepsis with E. coli bacteremia.  Labs 03/19/2024: WBC 12.7.  Hemoglobin 6.3.  BUN 18.  Creatinine 0.96.  Iron  < 10.  B12 level 2598.  Ferritin 52.  Transfused 1 unit of PRBCs 03/19/2024  Labs 03/20/2024: WBC 8.4.  Hemoglobin 7.9.  BUN 18.  Creatinine 0.80.  Potassium 3.1.  Labs today: WBC 8.9.  Hemoglobin 7.9.  FOBT positive.  GI consult was requested for further evaluation regarding iron  deficiency anemia with melena x 1.  She has a history of a NSAID induced gastric ulcer confirmed per EGD in 2013.  She denies having any further EGDs since then.  No GERD symptoms.  She denies having any nausea or vomiting.  No upper or lower abdominal pain.  She typically passes a normal brown formed stool daily.  She stated passing 1 somewhat loose black stool x 1 earlier today.  No bright red blood per the rectum.  Not on PP therapy.  Patient denies taking any aspirin  or other NSAIDs, ASA is listed on her home medication list.  Not on AC.  She underwent a colonoscopy in 213 which  identified 2 hyperplastic rectal polyps. See procedure reports below.  No family at the bedside.  Her 60 year old grandson lives with her.  GI PROCEDURES:  EGD 11/17/2011: Healing NSAID induced gastric ulcer in fundus, rule out H. pylori infection Path report negative for H. pylori.  Colonoscopy 11/17/2011: Diminutive flat polyp was found, multiple biopsies were performed using cold forceps The colon was otherwise normal Path report showed 2 hyperplastic rectal polyps   Past Medical History:  Diagnosis Date   Abdominal pain 08/23/2021   Chronic kidney disease (CKD), stage III (moderate) (HCC)    followed by pcp   Dysphagia 01/22/2021   Full dentures    HCAP (healthcare-associated pneumonia) 08/22/2021   Headache 08/23/2021   History of 2019 novel coronavirus disease (COVID-19) 04/05/2019   result in care everywhere ,  per pt mild symptoms that resolved   History of gastric ulcer 2013   non-bleeding   Hyperlipemia    Hypokalemia    Hyponatremia 08/23/2021   Low serum potassium level 01/29/2021   Mixed stress and urge incontinence    Osteoarthritis    Presence of pessary    Respiratory distress 11/28/2021   Sepsis (HCC) 11/28/2021   Type 2 diabetes mellitus (HCC)    followed by pcp   (08-26-2020 per pt checks blood sugar twice weekly, unsure what fasting sugar is)   UTI (urinary tract infection) 08/23/2021   Vaginal vault 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  gyn--- dr jannis, using pessary   Wears glasses     Past Surgical History:  Procedure Laterality Date   CARPAL TUNNEL RELEASE Right    1980s   CATARACT EXTRACTION W/ INTRAOCULAR LENS IMPLANT Bilateral 2013   approx   COLONOSCOPY WITH ESOPHAGOGASTRODUODENOSCOPY (EGD)  11/17/2011   COLPOCLEISIS N/A 09/02/2020   Procedure: COLPOCLEISIS, PERINEORRHAPHY;  Surgeon: Jannis Kate Norris, MD;  Location[facility];  Service: Gynecology;  Laterality: N/A;   CYSTOSCOPY N/A 09/02/2020   Procedure: CYSTOSCOPY;   Surgeon: Jannis Kate Norris, MD;  Location: [facility];  Service: Gynecology;  Laterality: N/A;   RETINAL DETACHMENT SURGERY Right 2003   TOTAL HIP ARTHROPLASTY Left 03/02/2010   @[facility]    TOTAL KNEE ARTHROPLASTY Right 11/03/2015   Procedure: RIGHT TOTAL KNEE ARTHROPLASTY;  Surgeon: Dempsey Moan, MD;  Location: [facility];  Service: Orthopedics;  Laterality: Right;   TOTAL VAGINAL HYSTERECTOMY  2012   TVH, anterior repair, TVT (no mesh used for the anterior repair)    Prior to Admission medications  Medication Sig Start Date End Date Taking? Authorizing Provider  aspirin  EC 81 MG tablet Take 81 mg by mouth daily. Swallow whole.   Yes [provider]  cetirizine (ZYRTEC) 10 MG tablet Take 10 mg by mouth daily.   Yes [provider]  cyanocobalamin  (VITAMIN B12) 1000 MCG tablet Take 1,000 mcg by mouth daily.   Yes [provider]  dimenhyDRINATE (DRAMAMINE) 50 MG tablet Take 50 mg by mouth daily as needed for nausea or dizziness.   Yes [provider]  empagliflozin  (JARDIANCE ) 25 MG TABS tablet Take 25 mg by mouth daily.   Yes [provider]  fluticasone  (FLONASE ) 50 MCG/ACT nasal spray SPRAY 1 SPRAY INTO EACH NOSTRIL TWICE A DAY AS NEEDED FOR ALLERGIES OR RHINITIS Patient taking differently: Place 1 spray into both nostrils daily as needed for allergies. 03/25/23  Yes Lucius Krabbe, NP  glipiZIDE  (GLUCOTROL  XL) 10 MG 24 hr tablet Take 1 tablet (10 mg total) by mouth 2 (two) times daily after a meal. 02/28/24  Yes Hudnell, Krabbe, NP  losartan  (COZAAR ) 50 MG tablet Take 1 tablet (50 mg total) by mouth daily. 02/28/24  Yes Hudnell, Krabbe, NP  metFORMIN  (GLUCOPHAGE -XR) 500 MG 24 hr tablet TAKE 1 TABLET BY MOUTH EVERY DAY WITH BREAKFAST. 02/28/24  Yes Lucius Krabbe, NP  Multiple Vitamin (MULTIVITAMIN WITH MINERALS) TABS tablet Take 2 tablets by mouth daily.   Yes [provider]  pravastatin  (PRAVACHOL ) 40 MG tablet  TAKE 1 TABLET BY MOUTH EVERYDAY AT BEDTIME 02/28/24  Yes Hudnell, Stephanie, NP  VITAMIN D PO Take 2 capsules by mouth daily.   Yes [provider]  ACCU-CHEK GUIDE test strip USE 1 STRIP TWICE A DAY 10/04/22   Lucius Krabbe, NP  Accu-Chek Softclix Lancets lancets CHECK BLOOD SUGARS 1-2 TIMES PER DAY. 09/12/23   Lucius Krabbe, NP  empagliflozin  (JARDIANCE ) 10 MG TABS tablet Take 1 tablet (10 mg total) by mouth daily before breakfast. Patient not taking: Reported on 03/19/2024 07/18/23   Lucius Krabbe, NP  traZODone  (DESYREL ) 50 MG tablet Take 0.5-1 tablets (25-50 mg total) by mouth at bedtime as needed. for sleep Patient not taking: Reported on 03/19/2024 07/18/23   Lucius Krabbe, NP    Current Facility-Administered Medications  Medication Dose Route Frequency Provider Last Rate Last Admin   acetaminophen  (TYLENOL ) tablet 650 mg  650 mg Oral Q6H PRN Amponsah, Prosper M, MD       Or  acetaminophen  (TYLENOL ) suppository 650 mg  650 mg Rectal Q6H PRN Lou Claretta HERO, MD       bisacodyl  (DULCOLAX) EC tablet 5 mg  5 mg Oral Daily PRN Lou Claretta HERO, MD       hydrALAZINE  (APRESOLINE ) injection 10 mg  10 mg Intravenous Q8H PRN Lou Claretta HERO, MD       insulin  aspart (novoLOG ) injection 0-15 Units  0-15 Units Subcutaneous TID WC Amponsah, Prosper M, MD   3 Units at 03/21/24 1132   insulin  aspart (novoLOG ) injection 0-5 Units  0-5 Units Subcutaneous QHS Lou Claretta HERO, MD   2 Units at 03/18/24 2250   losartan  (COZAAR ) tablet 50 mg  50 mg Oral Daily Lou Claretta HERO, MD   50 mg at 03/21/24 9074   melatonin tablet 5 mg  5 mg Oral QHS PRN Daniels, James K, NP   5 mg at 03/19/24 2212   ondansetron  (ZOFRAN ) injection 2 mg  2 mg Intravenous Once Daniels, James K, NP       pravastatin  (PRAVACHOL ) tablet 40 mg  40 mg Oral QHS Lou Claretta HERO, MD   40 mg at 03/20/24 2138   senna-docusate (Senokot-S) tablet 1 tablet  1 tablet Oral QHS PRN Lou Claretta HERO, MD        sulfamethoxazole -trimethoprim  (BACTRIM  DS) 800-160 MG per tablet 2 tablet  2 tablet Oral Q12H Dahal, Chapman, MD        Allergies as of 03/18/2024 - Review Complete 03/18/2024  Allergen Reaction Noted   Bacid  11/09/2011   Latex Other (See Comments) 08/23/2021   Norvasc  [amlodipine ] Swelling 08/26/2020    Family History  Problem Relation Age of Onset   Diabetes Mother        deceased   Heart attack Mother    Diabetes Sister        x2   Kidney cancer Father    Heart attack Father    Diabetes Sister     Social History   Socioeconomic History   Marital status: Divorced    Spouse name: Not on file   Number of children: Not on file   Years of education: Not on file   Highest education level: Not on file  Occupational History   Occupation: retired    Associate Professor: RETIRED  Tobacco Use   Smoking status: Never   Smokeless tobacco: Never  Vaping Use   Vaping status: Never Used  Substance and Sexual Activity   Alcohol use: No   Drug use: No   Sexual activity: Not Currently    Partners: Male    Birth control/protection: Surgical    Comment: hysterectomy  Other Topics Concern   Not on file  Social History Narrative   Divorced, 2 children; retired.    Custody of grandchildren    Social Drivers of Health   Tobacco Use: Low Risk (03/18/2024)   Patient History    Smoking Tobacco Use: Never    Smokeless Tobacco Use: Never    Passive Exposure: Not on file  Financial Resource Strain: Low Risk (09/27/2023)   Overall Financial Resource Strain (CARDIA)    Difficulty of Paying Living Expenses: Not hard at all  Food Insecurity: No Food Insecurity (03/18/2024)   Epic    Worried About Radiation Protection Practitioner of Food in the Last Year: Never true    Ran Out of Food in the Last Year: Never true  Transportation Needs: No Transportation Needs (03/18/2024)   Epic    Lack of Transportation (Medical): No  Lack of Transportation (Non-Medical): No  Physical Activity: Inactive (09/27/2023)   Exercise  Vital Sign    Days of Exercise per Week: 0 days    Minutes of Exercise per Session: 0 min  Stress: No Stress Concern Present (09/27/2023)   [employer] of Occupational Health - Occupational Stress Questionnaire    Feeling of Stress: Not at all  Social Connections: Socially Isolated (03/18/2024)   Social Connection and Isolation Panel    Frequency of Communication with Friends and Family: More than three times a week    Frequency of Social Gatherings with Friends and Family: More than three times a week    Attends Religious Services: Never    Database Administrator or Organizations: No    Attends Banker Meetings: Never    Marital Status: Divorced  Catering Manager Violence: Not At Risk (03/18/2024)   Epic    Fear of Current or Ex-Partner: No    Emotionally Abused: No    Physically Abused: No    Sexually Abused: No  Depression (PHQ2-9): Low Risk (09/27/2023)   Depression (PHQ2-9)    PHQ-2 Score: 0  Alcohol Screen: Low Risk (09/27/2023)   Alcohol Screen    Last Alcohol Screening Score (AUDIT): 0  Housing: Low Risk (03/18/2024)   Epic    Unable to Pay for Housing in the Last Year: No    Number of Times Moved in the Last Year: 0    Homeless in the Last Year: No  Utilities: Not At Risk (03/18/2024)   Epic    Threatened with loss of utilities: No  Health Literacy: Adequate Health Literacy (09/27/2023)   B1300 Health Literacy    Frequency of need for help with medical instructions: Never   Review of Systems: Gen: Denies fever, sweats or chills. No weight loss.  CV: Denies chest pain, palpitations or edema. Resp: Denies cough, shortness of breath of hemoptysis.  GI: See HPI.   GU: Denies urinary burning, blood in urine, increased urinary frequency or incontinence. MS: Denies joint pain, muscles aches or weakness. Derm: Denies rash, itchiness, skin lesions or unhealing ulcers. Psych: Denies depression, anxiety, memory loss or confusion. Heme: Denies easy bruising,  bleeding. Neuro:  Denies headaches, dizziness or paresthesias. Endo:  + Dm type II.  Physical Exam: Vital signs in last 24 hours: Temp:  [98 F (36.7 C)-99 F (37.2 C)] 98.6 F (37 C) (01/07 1330) Pulse Rate:  [98-109] 99 (01/07 1330) Resp:  [18] 18 (01/07 1330) BP: (139-145)/(65-81) 145/81 (01/07 1330) SpO2:  [95 %-100 %] 100 % (01/07 1330) Last BM Date : 03/18/24 General: Alert 79 year old female sitting at the bedside in no acute distress. Head:  Normocephalic and atraumatic. Eyes:  No scleral icterus. Conjunctiva pink. Ears:  Normal auditory acuity. Nose:  No deformity, discharge or lesions. Mouth:  Dentition intact. No ulcers or lesions.  Neck:  Supple. No lymphadenopathy or thyromegaly.  Lungs: Breath sounds clear throughout. No wheezes, rhonchi or crackles.  On room air. Heart: Regular rate and rhythm, very faint systolic murmur. Abdomen: Soft, nondistended.  Nontender.  Positive bowel sounds to all 4 quadrants. Rectal: Deferred. Musculoskeletal:  Symmetrical without gross deformities.  Pulses:  Normal pulses noted. Extremities:  Without clubbing or edema. Neurologic:  Alert and  oriented x 4. No focal deficits.  Skin:  Intact without significant lesions or rashes. Psych:  Alert and cooperative. Normal mood and affect.  Intake/Output from previous day: 01/06 0701 - 01/07 0700 In: 793.5 [P.O.:450; I.V.:241.4; IV Piggyback:102]  Out: 1900 [Urine:1900] Intake/Output this shift: Total I/O In: 240 [P.O.:240] Out: 300 [Urine:300]  Lab Results: Recent Labs    03/19/24 0428 03/19/24 1631 03/20/24 0454 03/21/24 0441  WBC 12.7*  --  8.4 8.9  HGB 6.3* 7.7* 7.9* 7.9*  HCT 21.5* 24.9* 25.8* 26.3*  PLT 319  --  315 351   BMET Recent Labs    03/19/24 0428 03/20/24 0454 03/21/24 0441  NA 137 137 134*  K 3.5 3.1* 4.0  CL 103 107 101  CO2 21* 21* 20*  GLUCOSE 152* 99 192*  BUN 18 18 20   CREATININE 0.96 0.80 0.99  CALCIUM  8.4* 8.2* 8.6*   LFT Recent Labs     03/18/24 1708  PROT 7.3  ALBUMIN 4.0  AST 17  ALT 11  ALKPHOS 106  BILITOT 0.7   PT/INR Recent Labs    03/18/24 1708  LABPROT 14.9  INR 1.1   Hepatitis Panel No results for input(s): HEPBSAG, HCVAB, HEPAIGM, HEPBIGM in the last 72 hours.    Studies/Results: No results found.  IMPRESSION/PLAN:  79 year old female admitted with altered mental status with sepsis secondary to UTI, E. coli bacteremia.  Received IV antibiotics.  Now on Bactrim  DS po twice daily. - Management per the hospitalist.  Acute on chronic anemia. Iron  deficient. Melena x 1 episode today concerning for UGI bleed. Admission Hg 7.6 -> 6.3 on 1/5 -> transfused 1 unit of PRBCs -> Hg 7.9. Remote history of NSAID induced gastric ulcer in 2013. - Diabetic diet as tolerated - NPO. after midnight - EGD 03/22/2024 benefits and risks discussed including risk with sedation, risk of bleeding, perforation and infection  - Pantoprazole  40 mg IV twice daily -Transfuse for hemoglobin level < 7 or as needed if symptomatic  - Consider IV iron  during this hospitalization  DM type II  CKD stage IIIa   Misty Cervantes  03/21/2024, 5:36 PM  GI ATTENDING  History, laboratories, x-rays, prior endoscopy reports all personally reviewed.  Patient personally seen and examined.  Agree with comprehensive consultation note as outlined above.  A total time of 75 minutes was spent in the comprehensive evaluation of this patient.  The medical decision making was deemed the highest level.  79 year old female presents with urosepsis.  On antibiotics.  Stable.  Also reported melena and was anemic.  She has a history of ulcer disease.  Agree with transfusion and IV PPI.  She appears stable for upper endoscopy tomorrow. The nature of the procedure, as well as the risks, benefits, and alternatives were carefully and thoroughly reviewed with the patient. Ample time for discussion and questions allowed. The patient understood, was  satisfied, and agreed to proceed. She is high risk given her age and comorbidities.  Misty Cervantes. Abran Raddle., M.D. [facility] Division of Gastroenterology    "

## 2024-03-21 NOTE — Inpatient Diabetes Management (Signed)
 Inpatient Diabetes Program Recommendations  AACE/ADA: New Consensus Statement on Inpatient Glycemic Control (2015)  Target Ranges:  Prepandial:   less than 140 mg/dL      Peak postprandial:   less than 180 mg/dL (1-2 hours)      Critically ill patients:  140 - 180 mg/dL   Lab Results  Component Value Date   GLUCAP 192 (H) 03/21/2024   HGBA1C 10.7 (H) 02/28/2024    Review of Glycemic Control  Latest Reference Range & Units 03/20/24 07:27 03/20/24 11:34 03/20/24 16:26 03/20/24 21:12 03/21/24 07:19 03/21/24 11:18  Glucose-Capillary 70 - 99 mg/dL 96 880 (H) 795 (H) 844 (H) 189 (H) 192 (H)  (H): Data is abnormally high  Diabetes history: DM2 Outpatient Diabetes medications:  Glipizide  10 mg every day Metformin  500 mg QD Current orders for Inpatient glycemic control:  Novolog  0-15 units TID and 0-5 units at bedtime  Inpatient DM recommendations:  Might consider having her monitor her glucose at home (she does not currently) and follow up with PCP.  She should bring her meter with her to PCP visit for review.  If glucose trends remain elevated, PCP might consider starting a low dose basal insulin  every day.    Met with patient at bedside.  She confirms above home DM medications.  Her PCP increased her Glipizide  from 10 mg every day to 10 mg BID 2 weeks ago.  Reviewed patient's current A1c of 10.7% (average BG of 260 mg/dL). Explained what a A1c is and what it measures. Also reviewed goal A1c with patient, importance of good glucose control @ home, and blood sugar goals.  Given age, the ADA standards recommends an A1C of 7-8%.   Glucose of 192 mg/dL this afternoon was checked 1 hr after eating which is too soon and not surprised it was elevated.    She tried Jardiance  samples from her PCP but stopped because she could not afford the medication as it was >$500 with her insurance.  If she has a history of UTI's, I would not recommend starting this medication.    She does not drink any  caloric beverages at home.  She states she is mindful of carbohydrates.  Reviewed hypoglycemia, < 70 mg/dL, signs, symptoms and treatments.   Thank you, Wyvonna Pinal, MSN, CDCES Diabetes Coordinator Inpatient Diabetes Program [phone] (team pager from 8a-5p)

## 2024-03-22 ENCOUNTER — Inpatient Hospital Stay (HOSPITAL_COMMUNITY)

## 2024-03-22 ENCOUNTER — Encounter (HOSPITAL_COMMUNITY)
Admission: EM | Disposition: A | Discharge status: Home / Self Care | Payer: Self-pay | Source: Home / Self Care | Attending: Internal Medicine

## 2024-03-22 ENCOUNTER — Encounter (HOSPITAL_COMMUNITY): Payer: Self-pay | Admitting: Student

## 2024-03-22 DIAGNOSIS — K449 Diaphragmatic hernia without obstruction or gangrene: Secondary | ICD-10-CM

## 2024-03-22 DIAGNOSIS — R011 Cardiac murmur, unspecified: Secondary | ICD-10-CM

## 2024-03-22 DIAGNOSIS — D62 Acute posthemorrhagic anemia: Secondary | ICD-10-CM

## 2024-03-22 DIAGNOSIS — E1122 Type 2 diabetes mellitus with diabetic chronic kidney disease: Secondary | ICD-10-CM

## 2024-03-22 DIAGNOSIS — K921 Melena: Secondary | ICD-10-CM | POA: Diagnosis not present

## 2024-03-22 DIAGNOSIS — N1831 Chronic kidney disease, stage 3a: Secondary | ICD-10-CM | POA: Diagnosis not present

## 2024-03-22 DIAGNOSIS — Z8711 Personal history of peptic ulcer disease: Secondary | ICD-10-CM | POA: Diagnosis not present

## 2024-03-22 DIAGNOSIS — N390 Urinary tract infection, site not specified: Secondary | ICD-10-CM | POA: Diagnosis not present

## 2024-03-22 DIAGNOSIS — I129 Hypertensive chronic kidney disease with stage 1 through stage 4 chronic kidney disease, or unspecified chronic kidney disease: Secondary | ICD-10-CM

## 2024-03-22 DIAGNOSIS — D490 Neoplasm of unspecified behavior of digestive system: Secondary | ICD-10-CM | POA: Diagnosis not present

## 2024-03-22 DIAGNOSIS — A419 Sepsis, unspecified organism: Secondary | ICD-10-CM | POA: Diagnosis not present

## 2024-03-22 DIAGNOSIS — C180 Malignant neoplasm of cecum: Secondary | ICD-10-CM | POA: Diagnosis not present

## 2024-03-22 DIAGNOSIS — K5730 Diverticulosis of large intestine without perforation or abscess without bleeding: Secondary | ICD-10-CM | POA: Diagnosis not present

## 2024-03-22 DIAGNOSIS — D509 Iron deficiency anemia, unspecified: Secondary | ICD-10-CM | POA: Diagnosis not present

## 2024-03-22 HISTORY — PX: ESOPHAGOGASTRODUODENOSCOPY: SHX5428

## 2024-03-22 LAB — ECHOCARDIOGRAM COMPLETE
AR max vel: 1.95 cm2
AV Area VTI: 1.9 cm2
AV Area mean vel: 1.95 cm2
AV Mean grad: 8.5 mmHg
AV Peak grad: 14.8 mmHg
Ao pk vel: 1.92 m/s
Area-P 1/2: 2.34 cm2
Calc EF: 55.2 %
Height: 59 in
S' Lateral: 1.9 cm
Single Plane A2C EF: 45.6 %
Single Plane A4C EF: 61.2 %
Weight: 2336 [oz_av]

## 2024-03-22 LAB — CBC WITH DIFFERENTIAL/PLATELET
Abs Immature Granulocytes: 0.21 K/uL — ABNORMAL HIGH (ref 0.00–0.07)
Basophils Absolute: 0 K/uL (ref 0.0–0.1)
Basophils Relative: 0 %
Eosinophils Absolute: 0.1 K/uL (ref 0.0–0.5)
Eosinophils Relative: 1 %
HCT: 26.7 % — ABNORMAL LOW (ref 36.0–46.0)
Hemoglobin: 8.2 g/dL — ABNORMAL LOW (ref 12.0–15.0)
Immature Granulocytes: 2 %
Lymphocytes Relative: 10 %
Lymphs Abs: 1.4 K/uL (ref 0.7–4.0)
MCH: 24.4 pg — ABNORMAL LOW (ref 26.0–34.0)
MCHC: 30.7 g/dL (ref 30.0–36.0)
MCV: 79.5 fL — ABNORMAL LOW (ref 80.0–100.0)
Monocytes Absolute: 1.1 K/uL — ABNORMAL HIGH (ref 0.1–1.0)
Monocytes Relative: 8 %
Neutro Abs: 10.6 K/uL — ABNORMAL HIGH (ref 1.7–7.7)
Neutrophils Relative %: 79 %
Platelets: 407 K/uL — ABNORMAL HIGH (ref 150–400)
RBC: 3.36 MIL/uL — ABNORMAL LOW (ref 3.87–5.11)
RDW: 15.5 % (ref 11.5–15.5)
WBC: 13.4 K/uL — ABNORMAL HIGH (ref 4.0–10.5)
nRBC: 0 % (ref 0.0–0.2)

## 2024-03-22 LAB — BASIC METABOLIC PANEL WITH GFR
Anion gap: 12 (ref 5–15)
BUN: 16 mg/dL (ref 8–23)
CO2: 21 mmol/L — ABNORMAL LOW (ref 22–32)
Calcium: 8.7 mg/dL — ABNORMAL LOW (ref 8.9–10.3)
Chloride: 101 mmol/L (ref 98–111)
Creatinine, Ser: 0.9 mg/dL (ref 0.44–1.00)
GFR, Estimated: >60 mL/min
Glucose, Bld: 243 mg/dL — ABNORMAL HIGH (ref 70–99)
Potassium: 3.7 mmol/L (ref 3.5–5.1)
Sodium: 134 mmol/L — ABNORMAL LOW (ref 135–145)

## 2024-03-22 LAB — GLUCOSE, CAPILLARY
Glucose-Capillary: 226 mg/dL — ABNORMAL HIGH (ref 70–99)
Glucose-Capillary: 162 mg/dL — ABNORMAL HIGH (ref 70–99)
Glucose-Capillary: 117 mg/dL — ABNORMAL HIGH (ref 70–99)

## 2024-03-22 MED ORDER — PROPOFOL 500 MG/50ML IV EMUL
INTRAVENOUS | Status: DC | PRN
  Administered 2024-03-22: 100 ug/kg/min via INTRAVENOUS

## 2024-03-22 MED ORDER — SODIUM CHLORIDE 0.9 % IV SOLN: INTRAVENOUS | Status: AC

## 2024-03-22 MED ORDER — METFORMIN HCL ER 500 MG PO TB24
500.0000 mg | ORAL_TABLET | Freq: Two times a day (BID) | ORAL | Status: DC
  Administered 2024-03-22 – 2024-03-23 (×3): 500 mg via ORAL
  Filled 2024-03-22 (×3): qty 1

## 2024-03-22 MED ORDER — NA SULFATE-K SULFATE-MG SULF 17.5-3.13-1.6 GM/177ML PO SOLN
0.5000 | Freq: Once | ORAL | Status: AC
  Administered 2024-03-23: 177 mL via ORAL
  Filled 2024-03-22: qty 1

## 2024-03-22 MED ORDER — DIPHENHYDRAMINE HCL 25 MG PO CAPS
25.0000 mg | ORAL_CAPSULE | Freq: Once | ORAL | Status: AC
  Administered 2024-03-22: 25 mg via ORAL
  Filled 2024-03-22: qty 1

## 2024-03-22 MED ORDER — GLIPIZIDE ER 10 MG PO TB24
10.0000 mg | ORAL_TABLET | Freq: Every day | ORAL | Status: DC
  Administered 2024-03-23: 10 mg via ORAL
  Filled 2024-03-22: qty 1

## 2024-03-22 MED ORDER — SODIUM CHLORIDE 0.9 % IV SOLN: INTRAVENOUS | Status: DC

## 2024-03-22 MED ORDER — PROPOFOL 10 MG/ML IV BOLUS
INTRAVENOUS | Status: DC | PRN
  Administered 2024-03-22 (×2): 20 mg via INTRAVENOUS

## 2024-03-22 NOTE — Plan of Care (Signed)
" °  Problem: Metabolic: Goal: Ability to maintain appropriate glucose levels will improve Outcome: Progressing   Problem: Tissue Perfusion: Goal: Adequacy of tissue perfusion will improve Outcome: Progressing   Problem: Nutrition: Goal: Adequate nutrition will be maintained Outcome: Progressing   "

## 2024-03-22 NOTE — Progress Notes (Signed)
 PT Cancellation Note  Patient Details Name: Misty Cervantes MRN: [id number] DOB: Mar 24, 1945   Cancelled Treatment:     Pt awaiting to go downstairs for an EGD.  Pt has been evaluated with rec for [facility].  Rehab Team to continue to follow and attempt to see another day.   Katheryn Leap  PTA Acute  Rehabilitation Services Office M-F          [phone]

## 2024-03-22 NOTE — Transfer of Care (Signed)
 Immediate Anesthesia Transfer of Care Note  Patient: Misty Cervantes  Procedure(s) Performed: EGD (ESOPHAGOGASTRODUODENOSCOPY)  Patient Location: PACU and Endoscopy Unit  Anesthesia Type:MAC  Level of Consciousness: awake and drowsy  Airway & Oxygen Therapy: Patient Spontanous Breathing and Patient connected to face mask oxygen  Post-op Assessment: Report given to RN and Post -op Vital signs reviewed and stable  Post vital signs: Reviewed and stable  Last Vitals:  Vitals Value Taken Time  BP 121/59 03/22/24 16:41  Temp    Pulse 80 03/22/24 16:43  Resp 38 03/22/24 16:43  SpO2 100 % 03/22/24 16:43  Vitals shown include unfiled device data.  Last Pain:  Vitals:   03/22/24 1507  TempSrc: Temporal  PainSc:          Complications: No notable events documented.

## 2024-03-22 NOTE — Progress Notes (Signed)
" ° ° °  PROCEDURAL EXPEDITER PROGRESS NOTE  Patient Name: Misty Cervantes  DOB:[birth year] Date of Admission: 03/18/2024  Date of Assessment:03/22/2024   -------------------------------------------------------------------------------------------------------------------   Brief clinical summary: 79 yr old female with Hx of T2DM, HTN, HLD, CKD3a, dysphagia, osteoarthritis and H/o gastic ulcers.  Going today for a EGD  Orders in place:  Yes   Communication with surgical team if no orders: n/a  Labs, test, and orders reviewed: yes  Requires surgical clearance:  No  What type of clearance: n/a  Clearance received: n/a  Barriers noted:none   Intervention provided by [facility] team:   Barrier resolved:  not applicable   -------------------------------------------------------------------------------------------------------------------  [employer], Ronal DELENA Bald Please contact us  directly via secure chat (search for [facility]) or by calling us  at [phone] [facility]).  "

## 2024-03-22 NOTE — Progress Notes (Addendum)
 "    [facility] Progress Note  CC:   Dark stools, anemia    Subjective: Patient stated she passed a larger black stool yesterday after dinner without recurrence since then.  She remains NPO for EGD this afternoon.   Objective:  Vital signs in last 24 hours: Temp:  [98.6 F (37 C)-100.1 F (37.8 C)] 99.4 F (37.4 C) (01/08 0511) Pulse Rate:  [91-99] 91 (01/08 0511) Resp:  [18] 18 (01/08 0511) BP: (126-145)/(62-81) 131/62 (01/08 0511) SpO2:  [94 %-100 %] 94 % (01/08 0511) Last BM Date : 03/21/24 General: Alert 79-year-old female in no acute distress. Heart: Regular rhythm, soft systolic murmur. Pulm: Breath sounds clear throughout. Abdomen: Soft, nondistended.  Nontender.  Positive bowel sounds to all 4 quadrants. Extremities: No lower extremity edema. Neurologic:  Alert and oriented x 4. Grossly normal neurologically. Psych:  Alert and cooperative. Normal mood and affect.  Intake/Output from previous day: 01/07 0701 - 01/08 0700 In: 1080 [P.O.:1080] Out: 2400 [Urine:2400] Intake/Output this shift: No intake/output data recorded.  Lab Results: Recent Labs    03/20/24 0454 03/21/24 0441 03/22/24 0513  WBC 8.4 8.9 13.4*  HGB 7.9* 7.9* 8.2*  HCT 25.8* 26.3* 26.7*  PLT 315 351 407*   BMET Recent Labs    03/20/24 0454 03/21/24 0441 03/22/24 0513  NA 137 134* 134*  K 3.1* 4.0 3.7  CL 107 101 101  CO2 21* 20* 21*  GLUCOSE 99 192* 243*  BUN 18 20 16   CREATININE 0.80 0.99 0.90  CALCIUM  8.2* 8.6* 8.7*   LFT No results for input(s): PROT, ALBUMIN, AST, ALT, ALKPHOS, BILITOT, BILIDIR, IBILI in the last 72 hours. PT/INR No results for input(s): LABPROT, INR in the last 72 hours. Hepatitis Panel No results for input(s): HEPBSAG, HCVAB, HEPAIGM, HEPBIGM in the last 72 hours.  No results found.  Assessment / Plan:  79 year old female admitted with altered mental status with sepsis secondary to UTI, E. coli bacteremia.   Received IV antibiotics.  Now on Bactrim  DS po twice daily. - Management per the hospitalist.   Acute on chronic anemia secondary to  UGI bleed with Melena x 2 on 1/7, last episode was larger volume and occurred after dinner. No further melena overnight or thus far this morning. Admission Hg 7.6 -> 6.3 on 1/5 -> transfused 1 unit of PRBCs -> Hg 7.9 -> today Hg 8.2 . Remote history of NSAID induced gastric ulcer in 2013.  Hemodynamically stable. - NPO - Patient to proceed with EGD this afternoon as planned - Pantoprazole  40 mg IV twice daily - Transfuse for hemoglobin level < 7 or as needed if symptomatic  - Consider IV iron  during this hospitalization  Heart murmur, in setting of anemia.  Patient denies prior history of heart murmur.  No angina or shortness of breath. - Discussed with Dr. Arlice, to consider ECHO during this hospitalization.  Patient to proceed with EGD as planned but not delay procedure at this juncture.  DM type II   CKD stage IIIa  Leukocytosis. WBC 8.9 -> today 13.4. Temp 100.58F on 1/7 -> today 99.45F.   Principal Problem:   Sepsis secondary to UTI [facility]) Active Problems:   Hypertension associated with diabetes (HCC)   Hyperlipidemia associated with type 2 diabetes mellitus (HCC)   Type 2 diabetes mellitus with hyperglycemia, without long-term current use of insulin  (HCC)   Chronic kidney disease, stage 3a (HCC)   Microcytic anemia   Generalized weakness   QT prolongation  E coli bacteremia   MCI (mild cognitive impairment)     LOS: 4 days   Elida CHRISTELLA Shawl  03/22/2024, 10:55 AM  GI ATTENDING  Interval history and data reviewed.  Agree with interval progress note.  She is for upper endoscopy this afternoon.  Norleen SAILOR. Abran Raddle., M.D. [facility] Division of Gastroenterology  "

## 2024-03-22 NOTE — Anesthesia Procedure Notes (Signed)
 Procedure Name: MAC Date/Time: 03/22/2024 4:24 PM  Performed by: Dasie Nena PARAS, CRNAPre-anesthesia Checklist: Patient identified, Emergency Drugs available, Suction available, Patient being monitored and Timeout performed Oxygen Delivery Method: Simple face mask Preoxygenation: POM used. Placement Confirmation: positive ETCO2

## 2024-03-22 NOTE — Anesthesia Preprocedure Evaluation (Addendum)
"                                    Anesthesia Evaluation  Patient identified by MRN, date of birth, ID band Patient awake    Reviewed: Allergy & Precautions, NPO status , Patient's Chart, lab work & pertinent test results  History of Anesthesia Complications Negative for: history of anesthetic complications  Airway Mallampati: I  TM Distance: >3 FB Neck ROM: Full    Dental  (+) Edentulous Upper, Edentulous Lower   Pulmonary    breath sounds clear to auscultation       Cardiovascular hypertension, Pt. on medications  Rhythm:Regular Rate:Normal     Neuro/Psych  Headaches    GI/Hepatic PUD,GERD  ,,  Endo/Other  diabetes, Type 2    Renal/GU Renal disease     Musculoskeletal  (+) Arthritis ,    Abdominal   Peds  Hematology  (+) Blood dyscrasia, anemia   Anesthesia Other Findings   Reproductive/Obstetrics                              Anesthesia Physical Anesthesia Plan  ASA: 3  Anesthesia Plan: MAC   Post-op Pain Management:    Induction: Intravenous  PONV Risk Score and Plan: 2 and Propofol  infusion and Treatment may vary due to age or medical condition  Airway Management Planned: Natural Airway and Simple Face Mask  Additional Equipment: None  Intra-op Plan:   Post-operative Plan:   Informed Consent: I have reviewed the patients History and Physical, chart, labs and discussed the procedure including the risks, benefits and alternatives for the proposed anesthesia with the patient or authorized representative who has indicated his/her understanding and acceptance.     Dental advisory given  Plan Discussed with: CRNA  Anesthesia Plan Comments:          Anesthesia Quick Evaluation  "

## 2024-03-22 NOTE — Progress Notes (Signed)
 " PROGRESS NOTE  Misty Cervantes  DOB: 1945/10/08  PCP: Lucius Krabbe, NP FMW:[id number]  DOA: 03/18/2024  LOS: 4 days  Hospital Day: 5  Subjective: Patient was seen and examined this morning. Lying on bed.  Not in distress.  One of her granddaughters at bedside. Patient reports black bowel movement again last night.  Tmax 100.1 last night, hemodynamically stable, breathing room air Labs from this morning with sodium 134, WBC count up to 13.4, hemoglobin 8.2 Blood sugar level  consistently over 200.  Brief narrative: Misty Cervantes is a 79 y.o. female with PMH significant for T2DM, HTN, HLD, CKD 3a, dysphagia, osteoarthritis, h/o gastric ulcers 1/4, patient presented to ED with complaint of weakness, nausea, and confusion.   Was noted to have fever to 102.6 with tachycardia,  Urinalysis showed hazy straw-colored urine with moderate leukocytes, many bacteria Started on Vancomycin /Cefepime /Metronidazole .  Also Hgb was low at 7.6 on arrival Admitted to [facility] Blood culture and urine culture sent on admission grew E. coli  Assessment and plan: Sepsis secondary to UTI  E coli bacteremia and UTI Patient with hx of UTIs presented with generalized weakness, nausea, fevers and mild confusion (resolved) Urinalysis shows signs of infection, Met sepsis criteria with tachycardia, fever, tachypnea and evidence of urinary infection Blood culture urine culture grew E. Coli Currently on IV Rocephin .  Per pharmacy, E. coli and blood resistant to the bacteria.  Antibiotic was transition to oral Bactrim  to complete 7-day course on 1/10. Repeat blood culture was sent on 1/6. WBC count and normalized but up to 13.4 today.  Also had a low-grade fever of in the last 4 hours.  Continue to monitor Recent Labs  Lab 03/18/24 1708 03/18/24 1721 03/19/24 0428 03/20/24 0454 03/21/24 0441 03/22/24 0513  WBC 9.6  --  12.7* 8.4 8.9 13.4*  LATICACIDVEN  --  1.6  --   --   --   --    Acute on chronic  anemia Melena Remote h/o stomach ulcer Baseline hemoglobin from 2023 was 11.7. Hemoglobin at the lowest of 6.3 on 1/5.  Anemia panel showed low ferritin and low iron  level.  Given monitor PRBC transfusion on 1/5.  1/7, reported 1 episode of melanotic stool.  FOBT positive Misty Cervantes GI consulted.  Pending EGD this morning CBC stable this morning. Recent Labs    03/19/24 0428 03/19/24 0431 03/19/24 1631 03/20/24 0454 03/21/24 0441 03/22/24 0513  HGB 6.3*  --  7.7* 7.9* 7.9* 8.2*  MCV 79.9*  --   --  80.1 81.4 79.5*  VITAMINB12  --  2,598*  --   --   --   --   FERRITIN  --  52  --   --   --   --   TIBC  --  357  --   --   --   --   IRON   --  <10*  --   --   --   --    Acute multiple encephalopathy  MCI (mild cognitive impairment) Was confused at presentation likely due to UTI.  Family also has concerns about developing dementia at home Had episodes of sundowning in the hospital. Mental status gradually improved to normal.  Able to have meaningful conversation. Continue delirium precautions Outpatient neurology encouraged for neuropsychiatric testing.  Type 2 diabetes mellitus uncontrolled with hyperglycemia A1c 10.7 on 02/28/2024 PTA meds-glipizide , metformin , Jardiance  Currently only on SSI/Accu-Cheks. Blood sugar level consistently running elevated to over 200.   I have ordered to resume  glipizide  and metformin  from tonight.  I would stop Jardiance  because of UTI Recent Labs  Lab 03/21/24 0719 03/21/24 1118 03/21/24 1641 03/21/24 2103 03/22/24 0725  GLUCAP 189* 192* 276* 290* 226*   HTN Blood pressure currently controlled on losartan  50 mg daily  HLD Continue simvastatin  CKD 3A Hypokalemia Mild hyponatremia Creatinine stable at baseline Sodium level mildly low.  Continue to monitor Potassium level improved with replacement Recent Labs  Lab 03/18/24 1708 03/19/24 0428 03/20/24 0454 03/21/24 0441 03/22/24 0513  NA 131* 137 137 134* 134*  K 4.6 3.5 3.1*  4.0 3.7  CL 94* 103 107 101 101  CO2 21* 21* 21* 20* 21*  GLUCOSE 237* 152* 99 192* 243*  BUN 15 18 18 20 16   CREATININE 1.08* 0.96 0.80 0.99 0.90  CALCIUM  9.2 8.4* 8.2* 8.6* 8.7*  MG  --  2.3  --   --   --   PHOS  --  4.0  --   --   --    QT prolongation EKG on admission showed prolonged QTc of 514 Avoid QTc prolonging meds and check electrolytes Repeat EKG 1/6 showed improvement in QTc to 463 ms.  Generalized weakness In the setting of acute illness and dehydration PT/OT consulted.  Home health recommended. Fall precautions  Systolic ejection murmur Likely age-related aortic sclerosis.  no echocardiogram evaluating the system or Care Everywhere.  obtain echocardiogram    Nutrition Status:         Mobility:   PT Orders: Active   PT Follow up Rec: Home Health Pt1/08/2024 1241    Goals of care   Code Status: Full Code     DVT prophylaxis:  Place and maintain sequential compression device Start: 03/19/24 1332   Antimicrobials: IV Rocephin  Fluid: None Consultants: GI Family Communication: Granddaughter at bedside  Status: Inpatient Level of care:  Med-Surg   Patient is from: Home Needs to continue in-hospital care: Pending GI consult Anticipated d/c to: In 1 to 2 days Home with home health      Diet:  Diet Order             Diet NPO time specified  Diet effective midnight                   Scheduled Meds:  [START ON 03/23/2024] glipiZIDE   10 mg Oral Q breakfast   insulin  aspart  0-15 Units Subcutaneous TID WC   insulin  aspart  0-5 Units Subcutaneous QHS   losartan   50 mg Oral Daily   metFORMIN   500 mg Oral BID WC   ondansetron  (ZOFRAN ) IV  2 mg Intravenous Once   pantoprazole  (PROTONIX ) IV  40 mg Intravenous Q12H   pravastatin   40 mg Oral QHS   sulfamethoxazole -trimethoprim   2 tablet Oral Q12H    PRN meds: acetaminophen  **OR** acetaminophen , bisacodyl , hydrALAZINE , senna-docusate   Infusions:     Antimicrobials: Anti-infectives  (From admission, onward)    Start     Dose/Rate Route Frequency Ordered Stop   03/21/24 2200  sulfamethoxazole -trimethoprim  (BACTRIM  DS) 800-160 MG per tablet 2 tablet        2 tablet Oral Every 12 hours 03/21/24 1448 03/25/24 0959   03/20/24 0600  cefTRIAXone  (ROCEPHIN ) 2 g in sodium chloride  0.9 % 100 mL IVPB  Status:  Discontinued        2 g 200 mL/hr over 30 Minutes Intravenous Every 24 hours 03/19/24 0717 03/21/24 1448   03/19/24 0815  cefTRIAXone  (ROCEPHIN ) 1 g in  sodium chloride  0.9 % 100 mL IVPB        1 g 200 mL/hr over 30 Minutes Intravenous  Once 03/19/24 0717 03/19/24 0930   03/19/24 0500  cefTRIAXone  (ROCEPHIN ) 1 g in sodium chloride  0.9 % 100 mL IVPB  Status:  Discontinued        1 g 200 mL/hr over 30 Minutes Intravenous Every 24 hours 03/18/24 2117 03/19/24 0717   03/18/24 1715  vancomycin  (VANCOREADY) IVPB 1500 mg/300 mL        1,500 mg 150 mL/hr over 120 Minutes Intravenous  Once 03/18/24 1642 03/18/24 2011   03/18/24 1645  ceFEPIme  (MAXIPIME ) 2 g in sodium chloride  0.9 % 100 mL IVPB        2 g 200 mL/hr over 30 Minutes Intravenous  Once 03/18/24 1640 03/18/24 1800   03/18/24 1645  metroNIDAZOLE  (FLAGYL ) IVPB 500 mg        500 mg 100 mL/hr over 60 Minutes Intravenous  Once 03/18/24 1640 03/18/24 1831   03/18/24 1645  vancomycin  (VANCOCIN ) IVPB 1000 mg/200 mL premix  Status:  Discontinued        1,000 mg 200 mL/hr over 60 Minutes Intravenous  Once 03/18/24 1640 03/18/24 1642       Objective: Vitals:   03/22/24 0127 03/22/24 0511  BP: 127/78 131/62  Pulse: 93 91  Resp: 18 18  Temp: 99.1 F (37.3 C) 99.4 F (37.4 C)  SpO2: 98% 94%    Intake/Output Summary (Last 24 hours) at 03/22/2024 1057 Last data filed at 03/22/2024 0600 Gross per 24 hour  Intake 840 ml  Output 2100 ml  Net -1260 ml   Filed Weights   03/18/24 1615  Weight: 66.2 kg   Weight change:  Body mass index is 29.49 kg/m.   Physical Exam: General exam: Pleasant, elderly Caucasian female.   Not in distress. Skin: No rashes, lesions or ulcers. HEENT: Atraumatic, normocephalic, no obvious bleeding Lungs: Clear to auscultation bilaterally,  CVS: S1, S2, no murmur,   GI/Abd: Soft, nontender, nondistended, bowel sound present,   CNS: Alert, awake, ordered x 3 Psychiatry: Mood appropriate Extremities: No pedal edema, no calf tenderness,   Data Review: I have personally reviewed the laboratory data and studies available.  F/u labs ordered Unresulted Labs (From admission, onward)     Start     Ordered   Unscheduled  CBC with Differential/Platelet  Tomorrow morning,   R        03/22/24 1057   Unscheduled  Basic metabolic panel with GFR  Tomorrow morning,   R        03/22/24 1057            Signed, Chapman Rota, MD [facility] 03/22/2024  "

## 2024-03-22 NOTE — TOC Initial Note (Signed)
 Transition of Care [facility]) - Initial/Assessment Note    Patient Details  Name: Misty Cervantes MRN: [id number] Date of Birth: February 18, 1946  [facility] CM/SW Contact:    Misty ASPEN, LCSW Phone Number: 03/22/2024, 3:20 PM  Clinical Narrative:                  Met with pt and family today to introduce CSW role with dc planning needs.  Noted PT have recommended HHPT follow up.  Pt is agreeable and has not agency preference.  Able to secure coverage with [facility] and agency info placed on AVS.  IP CM remains available if any new needs arise.     Expected Discharge Plan: Home w Home Health Services Barriers to Discharge: Continued Medical Work up   Patient Goals and CMS Choice Patient states their goals for this hospitalization and ongoing recovery are:: return home          Expected Discharge Plan and Services In-house Referral: Clinical Social Work   Post Acute Care Choice: Home Health Living arrangements for the past 2 months: Apartment                 DME Arranged: N/A DME Agency: NA       HH Arranged: PT HH Agency: [facility] Date HH Agency Contacted: 03/22/24 Time HH Agency Contacted: 1520 Representative spoke with at [facility] Agency: Misty Cervantes  Prior Living Arrangements/Services Living arrangements for the past 2 months: Apartment Lives with:: Relatives Patient language and need for interpreter reviewed:: Yes Do you feel safe going back to the place where you live?: Yes      Need for Family Participation in Patient Care: Yes (Comment) Care giver support system in place?: Yes (comment)   Criminal Activity/Legal Involvement Pertinent to Current Situation/Hospitalization: No - Comment as needed  Activities of Daily Living   ADL Screening (condition at time of admission) Independently performs ADLs?: Yes (appropriate for developmental age) (was able to do this; but not the past 3-4 days) Is the patient deaf or have difficulty hearing?: No Does the  patient have difficulty seeing, even when wearing glasses/contacts?: No Does the patient have difficulty concentrating, remembering, or making decisions?: No  Permission Sought/Granted Permission sought to share information with : Family Supports                Emotional Assessment Appearance:: Appears stated age Attitude/Demeanor/Rapport: Gracious Affect (typically observed): Accepting Orientation: : Oriented to Self, Oriented to Place, Oriented to  Time, Oriented to Situation Alcohol / Substance Use: Not Applicable Psych Involvement: No (comment)  Admission diagnosis:  Hypertension associated with diabetes (HCC) [E11.59, I15.2] Sepsis secondary to UTI (HCC) [A41.9, N39.0] Severe sepsis (HCC) [A41.9, R65.20] Hyperlipidemia associated with type 2 diabetes mellitus (HCC) [E11.69, E78.5] Type 2 diabetes mellitus with hyperglycemia, without long-term current use of insulin  (HCC) [E11.65] Patient Active Problem List   Diagnosis Date Noted   Melena 03/22/2024   Acute blood loss anemia 03/22/2024   History of peptic ulcer disease 03/22/2024   MCI (mild cognitive impairment) 03/20/2024   E coli bacteremia 03/19/2024   Sepsis secondary to UTI (HCC) 03/18/2024   Microcytic anemia 03/18/2024   Generalized weakness 03/18/2024   QT prolongation 03/18/2024   Pain in left wrist 11/03/2023   Primary insomnia 12/07/2021   Pneumonia of both lower lobes due to infectious organism    Chronic kidney disease, stage 3a (HCC)    Spinal stenosis of lumbar region 05/13/2020   Aortic  atherosclerosis 06/22/2019   Type 2 diabetes mellitus with hyperglycemia, without long-term current use of insulin  (HCC) 06/18/2016   OA (osteoarthritis) of knee 11/03/2015   Hyperlipidemia associated with type 2 diabetes mellitus (HCC) 07/23/2014   Genu valgum 08/11/2012   Hypertension associated with diabetes (HCC) 02/25/2010   PCP:  Misty Krabbe, NP Pharmacy:   [facility] [phone] [place] - [address] [place] [address] Phone: [phone] Fax: [phone]  [name] [address] [place] [address] Phone: [phone] Fax: [phone]     Social Drivers of Health (SDOH) Social History: SDOH Screenings   Food Insecurity: No Food Insecurity (03/18/2024)  Housing: Low Risk (03/18/2024)  Transportation Needs: No Transportation Needs (03/18/2024)  Utilities: Not At Risk (03/18/2024)  Alcohol Screen: Low Risk (09/27/2023)  Depression (PHQ2-9): Low Risk (09/27/2023)  Financial Resource Strain: Low Risk (09/27/2023)  Physical Activity: Inactive (09/27/2023)  Social Connections: Socially Isolated (03/18/2024)  Stress: No Stress Concern Present (09/27/2023)  Tobacco Use: Low Risk (03/22/2024)  Health Literacy: Adequate Health Literacy (09/27/2023)   SDOH Interventions:     Readmission Risk Interventions    03/22/2024    3:09 PM  Readmission Risk Prevention Plan  Post Dischage Appt Complete  Medication Screening Complete  Transportation Screening Complete

## 2024-03-22 NOTE — Progress Notes (Signed)
2D echo complete 

## 2024-03-22 NOTE — Op Note (Signed)
 [facility] Patient Name: Misty Cervantes Procedure Date: 03/22/2024 MRN: [id number] Attending MD: Norleen SAILOR. Abran , MD, [id number] Date of Birth: 1945-08-16 CSN: [id number] Age: 79 Admit Type: Inpatient Procedure:                Upper GI endoscopy Indications:              Melena Providers:                Norleen SAILOR. Abran, MD, Jacquelyn Jaci Pierce, RN,                            Lorrayne Kitty, Technician Referring MD:             [facility] Medicines:                Monitored Anesthesia Care Complications:            No immediate complications. Estimated Blood Loss:     Estimated blood loss: none. Procedure:                Pre-Anesthesia Assessment:                           - Prior to the procedure, a History and Physical                            was performed, and patient medications and                            allergies were reviewed. The patient's tolerance of                            previous anesthesia was also reviewed. The risks                            and benefits of the procedure and the sedation                            options and risks were discussed with the patient.                            All questions were answered, and informed consent                            was obtained. Prior Anticoagulants: The patient has                            taken no anticoagulant or antiplatelet agents. ASA                            Grade Assessment: II - A patient with mild systemic                            disease. After reviewing the risks and benefits,  the patient was deemed in satisfactory condition to                            undergo the procedure.                           After obtaining informed consent, the endoscope was                            passed under direct vision. Throughout the                            procedure, the patient's blood pressure, pulse, and                            oxygen  saturations were monitored continuously. The                            [id number]-[id number] ([id number]) Olympus endoscope was introduced                            through the mouth, and advanced to the third part                            of duodenum. The upper GI endoscopy was                            accomplished without difficulty. The patient                            tolerated the procedure well. Scope In: Scope Out: Findings:      The esophagus was normal.      The stomach was normal. Hiatal hernia      The examined duodenum to the third portion was normal.      The cardia and gastric fundus were normal on retroflexion.      There was no bleeding or blood in the upper gut. Impression:               - Normal esophagus.                           - Normal stomach. Hiatal hernia.                           - Normal examined duodenum.                           - No source for GI bleed found. Moderate Sedation:      none Recommendation:           1. Return to the hospital ward                           2. Heart healthy diet                           3.  Continue to treat primary medical problem                           4. Colonoscopy to rule out colonic source, such as                            right sided lesion, upper GI bleeding. Due to                            constraints with anesthesia time, this will likely                            occur Saturday. She would prep tomorrow.                           We will continue to follow. Discussed with patient.                            She was provided a copy of this procedure report Procedure Code(s):        --- Professional ---                           [id number], Esophagogastroduodenoscopy, flexible,                            transoral; diagnostic, including collection of                            specimen(s) by brushing or washing, when performed                            (separate procedure) Diagnosis Code(s):        --- Professional  ---                           K92.1, Melena (includes Hematochezia) CPT copyright 2022 [employer] Association. All rights reserved. The codes documented in this report are preliminary and upon coder review may  be revised to meet current compliance requirements. Norleen SAILOR. Abran, MD 03/22/2024 4:45:59 PM This report has been signed electronically. Number of Addenda: 0

## 2024-03-22 NOTE — Plan of Care (Signed)
   Problem: Education: Goal: Ability to describe self-care measures that may prevent or decrease complications (Diabetes Survival Skills Education) will improve Outcome: Progressing Goal: Individualized Educational Video(s) Outcome: Progressing   Problem: Coping: Goal: Ability to adjust to condition or change in health will improve Outcome: Progressing

## 2024-03-23 ENCOUNTER — Encounter (HOSPITAL_COMMUNITY): Payer: Self-pay | Admitting: Internal Medicine

## 2024-03-23 DIAGNOSIS — A419 Sepsis, unspecified organism: Secondary | ICD-10-CM | POA: Diagnosis not present

## 2024-03-23 DIAGNOSIS — N390 Urinary tract infection, site not specified: Secondary | ICD-10-CM | POA: Diagnosis not present

## 2024-03-23 DIAGNOSIS — D509 Iron deficiency anemia, unspecified: Secondary | ICD-10-CM | POA: Diagnosis not present

## 2024-03-23 DIAGNOSIS — K921 Melena: Secondary | ICD-10-CM | POA: Diagnosis not present

## 2024-03-23 LAB — CBC WITH DIFFERENTIAL/PLATELET
Abs Immature Granulocytes: 0.26 K/uL — ABNORMAL HIGH (ref 0.00–0.07)
Basophils Absolute: 0 K/uL (ref 0.0–0.1)
Basophils Relative: 0 %
Eosinophils Absolute: 0.1 K/uL (ref 0.0–0.5)
Eosinophils Relative: 1 %
HCT: 24.8 % — ABNORMAL LOW (ref 36.0–46.0)
Hemoglobin: 7.4 g/dL — ABNORMAL LOW (ref 12.0–15.0)
Immature Granulocytes: 2 %
Lymphocytes Relative: 12 %
Lymphs Abs: 1.6 K/uL (ref 0.7–4.0)
MCH: 23.9 pg — ABNORMAL LOW (ref 26.0–34.0)
MCHC: 29.8 g/dL — ABNORMAL LOW (ref 30.0–36.0)
MCV: 80.3 fL (ref 80.0–100.0)
Monocytes Absolute: 1 K/uL (ref 0.1–1.0)
Monocytes Relative: 8 %
Neutro Abs: 9.9 K/uL — ABNORMAL HIGH (ref 1.7–7.7)
Neutrophils Relative %: 77 %
Platelets: 412 K/uL — ABNORMAL HIGH (ref 150–400)
RBC: 3.09 MIL/uL — ABNORMAL LOW (ref 3.87–5.11)
RDW: 15.9 % — ABNORMAL HIGH (ref 11.5–15.5)
WBC: 12.9 K/uL — ABNORMAL HIGH (ref 4.0–10.5)
nRBC: 0 % (ref 0.0–0.2)

## 2024-03-23 LAB — BASIC METABOLIC PANEL WITH GFR
Anion gap: 12 (ref 5–15)
BUN: 18 mg/dL (ref 8–23)
CO2: 20 mmol/L — ABNORMAL LOW (ref 22–32)
Calcium: 8.5 mg/dL — ABNORMAL LOW (ref 8.9–10.3)
Chloride: 101 mmol/L (ref 98–111)
Creatinine, Ser: 1.17 mg/dL — ABNORMAL HIGH (ref 0.44–1.00)
GFR, Estimated: 47 mL/min — ABNORMAL LOW
Glucose, Bld: 225 mg/dL — ABNORMAL HIGH (ref 70–99)
Potassium: 4.3 mmol/L (ref 3.5–5.1)
Sodium: 133 mmol/L — ABNORMAL LOW (ref 135–145)

## 2024-03-23 LAB — GLUCOSE, CAPILLARY
Glucose-Capillary: 136 mg/dL — ABNORMAL HIGH (ref 70–99)
Glucose-Capillary: 329 mg/dL — ABNORMAL HIGH (ref 70–99)
Glucose-Capillary: 253 mg/dL — ABNORMAL HIGH (ref 70–99)
Glucose-Capillary: 229 mg/dL — ABNORMAL HIGH (ref 70–99)
Glucose-Capillary: 116 mg/dL — ABNORMAL HIGH (ref 70–99)
Glucose-Capillary: 81 mg/dL (ref 70–99)
Glucose-Capillary: 261 mg/dL — ABNORMAL HIGH (ref 70–99)

## 2024-03-23 MED ORDER — GLIPIZIDE ER 10 MG PO TB24
10.0000 mg | ORAL_TABLET | Freq: Two times a day (BID) | ORAL | Status: DC
  Administered 2024-03-24 – 2024-03-28 (×9): 10 mg via ORAL
  Filled 2024-03-23 (×11): qty 1

## 2024-03-23 MED ORDER — PANTOPRAZOLE SODIUM 40 MG PO TBEC
40.0000 mg | DELAYED_RELEASE_TABLET | Freq: Every day | ORAL | Status: DC
  Administered 2024-03-24 – 2024-03-31 (×8): 40 mg via ORAL
  Filled 2024-03-23 (×8): qty 1

## 2024-03-23 NOTE — Progress Notes (Signed)
 " PROGRESS NOTE  Rock CHRISTELLA Meissner  DOB: October 31, 1945  PCP: Lucius Krabbe, NP FMW:[id number]  DOA: 03/18/2024  LOS: 5 days  Hospital Day: 6  Subjective: Patient was seen and examined this morning. Sitting up at the edge of the bed.  Not in distress.  No new symptoms. Has not had a bowel movement since yesterday. Afebrile hemodynamically stable, breathing on room air Blood sugar level over 250 this morning. Labs from this morning with sodium 133, creatinine elevated to 1.17,  hemoglobin down to 7.4  Brief narrative: MARYKATHLEEN RUSSI is a 79 y.o. female with PMH significant for T2DM, HTN, HLD, CKD 3a, dysphagia, osteoarthritis, h/o gastric ulcers 1/4, patient presented to ED with complaint of weakness, nausea, and confusion.   Was noted to have fever to 102.6 with tachycardia,  Urinalysis showed hazy straw-colored urine with moderate leukocytes, many bacteria Started on Vancomycin /Cefepime /Metronidazole .  Also Hgb was low at 7.6 on arrival Admitted to [facility] Blood culture and urine culture sent on admission grew E. Coli  Her hospital course was complicated by acute drop in hemoglobin.  GI was consulted. See below for details  Assessment and plan: Sepsis secondary to UTI  E coli bacteremia and UTI Patient with hx of UTIs presented with generalized weakness, nausea, fevers and mild confusion (resolved) Urinalysis shows signs of infection, Met sepsis criteria with tachycardia, fever, tachypnea and evidence of urinary infection Blood culture urine culture grew E. Coli Currently on IV Rocephin .  Per pharmacy, E. coli and blood resistant to the bacteria.  Antibiotic was transition to oral Bactrim  to complete 7-day course on 1/10. Repeat blood culture sent on 1/6 has not shown any growth so far. Recent Labs  Lab 03/18/24 1721 03/19/24 0428 03/20/24 0454 03/21/24 0441 03/22/24 0513 03/23/24 0519  WBC  --  12.7* 8.4 8.9 13.4* 12.9*  LATICACIDVEN 1.6  --   --   --   --   --    Acute  on chronic anemia Melena Remote h/o stomach ulcer Baseline hemoglobin from 2023 was 11.7. Hemoglobin at the lowest of 6.3 on 1/5.  Anemia panel showed low ferritin and low iron  level.  Given monitor PRBC transfusion on 1/5.  1/7, reported 1 episode of melanotic stool.  FOBT positive [facility] consulted. 1/8, underwent EGD which was unremarkable 1/9, hemoglobin down to 7.4 today.  Noted to plan from GI for colonoscopy tomorrow. Recent Labs    03/19/24 0431 03/19/24 1631 03/20/24 0454 03/21/24 0441 03/22/24 0513 03/23/24 0519  HGB  --  7.7* 7.9* 7.9* 8.2* 7.4*  MCV  --   --  80.1 81.4 79.5* 80.3  VITAMINB12 2,598*  --   --   --   --   --   FERRITIN 52  --   --   --   --   --   TIBC 357  --   --   --   --   --   IRON  <10*  --   --   --   --   --    Acute multiple encephalopathy  MCI (mild cognitive impairment) Was confused at presentation likely due to UTI.  Family also has concerns about developing dementia at home Had episodes of sundowning in the hospital. Mental status gradually improved to normal.  Able to have meaningful conversation. Continue delirium precautions Outpatient neurology follow-up.  Type 2 diabetes mellitus uncontrolled with hyperglycemia A1c 10.7 on 02/28/2024 PTA meds-glipizide , metformin , Jardiance  Diabetes care coordinator consult appreciated Glipizide , metformin  were resumed yesterday.  Blood sugar level consistently running elevated to over 200.   Increase glipizide  dose to home dose of 10 mg twice daily.  Continue metformin  500 mg twice daily Avoid Jardiance  because of UTI Continue SSI with Accu-Cheks. Recent Labs  Lab 03/22/24 1536 03/22/24 1738 03/22/24 2102 03/23/24 0747 03/23/24 1244  GLUCAP 117* 136* 329* 253* 229*   HTN Blood pressure currently controlled on losartan  50 mg daily  HLD Continue simvastatin  CKD 3A Hypokalemia Mild hyponatremia Creatinine stable at baseline Sodium level mildly low.  Continue to  monitor Potassium level improved with replacement Recent Labs  Lab 03/19/24 0428 03/20/24 0454 03/21/24 0441 03/22/24 0513 03/23/24 0519  NA 137 137 134* 134* 133*  K 3.5 3.1* 4.0 3.7 4.3  CL 103 107 101 101 101  CO2 21* 21* 20* 21* 20*  GLUCOSE 152* 99 192* 243* 225*  BUN 18 18 20 16 18   CREATININE 0.96 0.80 0.99 0.90 1.17*  CALCIUM  8.4* 8.2* 8.6* 8.7* 8.5*  MG 2.3  --   --   --   --   PHOS 4.0  --   --   --   --    QT prolongation EKG on admission showed prolonged QTc of 514 Avoid QTc prolonging meds and check electrolytes Repeat EKG 1/6 showed improvement in QTc to 463 ms.  Generalized weakness In the setting of acute illness and dehydration PT/OT consulted.  Home health recommended. Fall precautions  Systolic ejection murmur Likely age-related aortic sclerosis, confirmed with echocardiogram 1/8.   Nutrition Status:         Mobility:   PT Orders: Active   PT Follow up Rec: Home Health Pt1/08/2024 1241    Goals of care   Code Status: Full Code     DVT prophylaxis:  Place and maintain sequential compression device Start: 03/19/24 1332   Antimicrobials: IV Rocephin  Fluid: None Consultants: GI Family Communication: None at bedside today.  Status: Inpatient Level of care:  Med-Surg   Patient is from: Home Needs to continue in-hospital care: Pending colonoscopy tomorrow Anticipated d/c to: Hopefully home in 1 to 2 days    Diet:  Diet Order             Diet NPO time specified  Diet effective midnight           Diet clear liquid Fluid consistency: Thin  Diet effective 0500                   Scheduled Meds:  glipiZIDE   10 mg Oral BID   insulin  aspart  0-15 Units Subcutaneous TID WC   insulin  aspart  0-5 Units Subcutaneous QHS   losartan   50 mg Oral Daily   metFORMIN   500 mg Oral BID WC   Na Sulfate-K Sulfate-Mg Sulfate concentrate  0.5 kit Oral Once   Followed by   Na Sulfate-K Sulfate-Mg Sulfate concentrate  0.5 kit Oral Once    ondansetron  (ZOFRAN ) IV  2 mg Intravenous Once   pantoprazole  (PROTONIX ) IV  40 mg Intravenous Q12H   pravastatin   40 mg Oral QHS   sulfamethoxazole -trimethoprim   2 tablet Oral Q12H    PRN meds: acetaminophen  **OR** acetaminophen , bisacodyl , hydrALAZINE , senna-docusate   Infusions:   sodium chloride  10 mL/hr at 03/22/24 1834     Antimicrobials: Anti-infectives (From admission, onward)    Start     Dose/Rate Route Frequency Ordered Stop   03/21/24 2200  sulfamethoxazole -trimethoprim  (BACTRIM  DS) 800-160 MG per tablet 2 tablet  2 tablet Oral Every 12 hours 03/21/24 1448 03/25/24 0959   03/20/24 0600  cefTRIAXone  (ROCEPHIN ) 2 g in sodium chloride  0.9 % 100 mL IVPB  Status:  Discontinued        2 g 200 mL/hr over 30 Minutes Intravenous Every 24 hours 03/19/24 0717 03/21/24 1448   03/19/24 0815  cefTRIAXone  (ROCEPHIN ) 1 g in sodium chloride  0.9 % 100 mL IVPB        1 g 200 mL/hr over 30 Minutes Intravenous  Once 03/19/24 0717 03/19/24 0930   03/19/24 0500  cefTRIAXone  (ROCEPHIN ) 1 g in sodium chloride  0.9 % 100 mL IVPB  Status:  Discontinued        1 g 200 mL/hr over 30 Minutes Intravenous Every 24 hours 03/18/24 2117 03/19/24 0717   03/18/24 1715  vancomycin  (VANCOREADY) IVPB 1500 mg/300 mL        1,500 mg 150 mL/hr over 120 Minutes Intravenous  Once 03/18/24 1642 03/18/24 2011   03/18/24 1645  ceFEPIme  (MAXIPIME ) 2 g in sodium chloride  0.9 % 100 mL IVPB        2 g 200 mL/hr over 30 Minutes Intravenous  Once 03/18/24 1640 03/18/24 1800   03/18/24 1645  metroNIDAZOLE  (FLAGYL ) IVPB 500 mg        500 mg 100 mL/hr over 60 Minutes Intravenous  Once 03/18/24 1640 03/18/24 1831   03/18/24 1645  vancomycin  (VANCOCIN ) IVPB 1000 mg/200 mL premix  Status:  Discontinued        1,000 mg 200 mL/hr over 60 Minutes Intravenous  Once 03/18/24 1640 03/18/24 1642       Objective: Vitals:   03/23/24 0204 03/23/24 0523  BP: 122/89 111/88  Pulse: 93 89  Resp: 18 18  Temp: 99.1 F (37.3  C) 99 F (37.2 C)  SpO2: 94% 96%    Intake/Output Summary (Last 24 hours) at 03/23/2024 1344 Last data filed at 03/23/2024 0600 Gross per 24 hour  Intake 1253.6 ml  Output 525 ml  Net 728.6 ml   Filed Weights   03/18/24 1615  Weight: 66.2 kg   Weight change:  Body mass index is 29.49 kg/m.   Physical Exam: General exam: Pleasant, elderly Caucasian female.  Not in distress. Skin: No rashes, lesions or ulcers. HEENT: Atraumatic, normocephalic, no obvious bleeding Lungs: Clear to auscultation bilaterally,  CVS: S1, S2, no murmur,   GI/Abd: Soft, nontender, nondistended, bowel sound present,   CNS: Alert, awake, ordered x 3 Psychiatry: Mood appropriate Extremities: No pedal edema, no calf tenderness,   Data Review: I have personally reviewed the laboratory data and studies available.  F/u labs ordered Unresulted Labs (From admission, onward)     Start     Ordered   03/24/24 0500  CBC  Tomorrow morning,   R        03/23/24 0556   03/24/24 0500  Basic metabolic panel  Tomorrow morning,   R        03/23/24 0556            Signed, Chapman Rota, MD [facility] 03/23/2024  "

## 2024-03-23 NOTE — Anesthesia Postprocedure Evaluation (Signed)
"   Anesthesia Post Note  Patient: Misty Cervantes  Procedure(s) Performed: EGD (ESOPHAGOGASTRODUODENOSCOPY)     Patient location during evaluation: Endoscopy Anesthesia Type: MAC Level of consciousness: awake Pain management: pain level controlled Vital Signs Assessment: post-procedure vital signs reviewed and stable Respiratory status: spontaneous breathing, nonlabored ventilation and respiratory function stable Cardiovascular status: blood pressure returned to baseline and stable Postop Assessment: no apparent nausea or vomiting Anesthetic complications: no   No notable events documented.  Last Vitals:  Vitals:   03/23/24 1407 03/23/24 1739  BP: 115/60 117/75  Pulse: 80 87  Resp: 14 16  Temp: 36.6 C 36.6 C  SpO2: 98% 95%    Last Pain:  Vitals:   03/23/24 0900  TempSrc:   PainSc: 0-No pain   Pain Goal:                   Chantry Headen P Jonahtan Manseau      "

## 2024-03-23 NOTE — Progress Notes (Addendum)
 "    Misty Cervantes  CC:  Dark stools, anemia   Subjective: She is sitting up in the chair.  Tolerating a clear liquid diet.  No nausea or vomiting.  No abdominal pain.  No BM, melena or blood per the rectum overnight or thus far this morning.  No chest pain or shortness of breath.  No family at the bedside.   Objective:   EGD 03/22/2024: - Normal esophagus.  - Normal stomach. Hiatal hernia.  - Normal examined duodenum.  - No source for GI bleed found.  Vital signs in last 24 hours: Temp:  [97.9 F (36.6 C)-99.1 F (37.3 C)] 99 F (37.2 C) (01/09 0523) Pulse Rate:  [75-94] 89 (01/09 0523) Resp:  [12-25] 18 (01/09 0523) BP: (106-144)/(56-89) 111/88 (01/09 0523) SpO2:  [94 %-100 %] 96 % (01/09 0523) Last BM Date : 03/21/24 General: Alert 79 year old female in no acute distress. Heart: Regular rate and rhythm, systolic murmur. Pulm: Breath sounds clear throughout.  On room air. Abdomen: Soft, nondistended.  Nontender.  Positive bowel sounds to all 4 quadrants. Extremities: No lower extremity edema. Neurologic:  Alert and oriented x 4. Grossly normal neurologically. Psych:  Alert and cooperative. Normal mood and affect.  Intake/Output from previous day: 01/08 0701 - 01/09 0700 In: 1253.6 [P.O.:840; I.V.:413.6] Out: 825 [Urine:825] Intake/Output this shift: No intake/output data recorded.  Lab Results: Recent Labs    03/21/24 0441 03/22/24 0513 03/23/24 0519  WBC 8.9 13.4* 12.9*  HGB 7.9* 8.2* 7.4*  HCT 26.3* 26.7* 24.8*  PLT 351 407* 412*   BMET Recent Labs    03/21/24 0441 03/22/24 0513 03/23/24 0519  NA 134* 134* 133*  K 4.0 3.7 4.3  CL 101 101 101  CO2 20* 21* 20*  GLUCOSE 192* 243* 225*  BUN 20 16 18   CREATININE 0.99 0.90 1.17*  CALCIUM  8.6* 8.7* 8.5*   LFT No results for input(s): PROT, ALBUMIN, AST, ALT, ALKPHOS, BILITOT, BILIDIR, IBILI in the last 72 hours. PT/INR No results for input(s): LABPROT,  INR in the last 72 hours. Hepatitis Panel No results for input(s): HEPBSAG, HCVAB, HEPAIGM, HEPBIGM in the last 72 hours.  ECHOCARDIOGRAM COMPLETE Result Date: 03/22/2024    ECHOCARDIOGRAM REPORT   Patient Name:   [name] Date of Exam: 03/22/2024 Medical Rec #:  [id number]       Height:       59.0 in Accession #:    [id number]      Weight:       146.0 lb Date of Birth:  11-13-1945      BSA:          1.613 m Patient Age:    15 years        BP:           143/56 mmHg Patient Gender: F               HR:           89 bpm. Exam Location:  Inpatient Procedure: 2D Echo, Cardiac Doppler and Color Doppler (Both Spectral and Color            Flow Doppler were utilized during procedure). Indications:    Murmur R01.1  History:        Patient has no prior history of Echocardiogram examinations.                 Risk Factors:Diabetes.  Sonographer:    Sydnee Wilson RDCS Referring Phys: [phone]  BINAYA DAHAL IMPRESSIONS  1. Left ventricular ejection fraction, by estimation, is 65 to 70%. The left ventricle has normal function. The left ventricle has no regional wall motion abnormalities. There is moderate asymmetric left ventricular hypertrophy of the basal-septal segment. Left ventricular diastolic parameters are consistent with Grade I diastolic dysfunction (impaired relaxation).  2. Right ventricular systolic function is normal. The right ventricular size is normal.  3. The mitral valve is normal in structure. Trivial mitral valve regurgitation. No evidence of mitral stenosis.  4. The aortic valve is tricuspid. There is mild calcification of the aortic valve. Aortic valve regurgitation is trivial. Aortic valve sclerosis is present, with no evidence of aortic valve stenosis.  5. The inferior vena cava is normal in size with greater than 50% respiratory variability, suggesting right atrial pressure of 3 mmHg. Comparison(s): No prior Echocardiogram. Conclusion(s)/Recommendation(s): Otherwise normal echocardiogram,  with minor abnormalities described in the report. FINDINGS  Left Ventricle: Left ventricular ejection fraction, by estimation, is 65 to 70%. The left ventricle has normal function. The left ventricle has no regional wall motion abnormalities. The left ventricular internal cavity size was normal in size. There is  moderate asymmetric left ventricular hypertrophy of the basal-septal segment. Left ventricular diastolic parameters are consistent with Grade I diastolic dysfunction (impaired relaxation). Right Ventricle: The right ventricular size is normal. No increase in right ventricular wall thickness. Right ventricular systolic function is normal. Left Atrium: Left atrial size was normal in size. Right Atrium: Right atrial size was normal in size. Pericardium: There is no evidence of pericardial effusion. Mitral Valve: The mitral valve is normal in structure. Trivial mitral valve regurgitation. No evidence of mitral valve stenosis. Tricuspid Valve: The tricuspid valve is not well visualized. Tricuspid valve regurgitation is trivial. No evidence of tricuspid stenosis. Aortic Valve: The aortic valve is tricuspid. There is mild calcification of the aortic valve. Aortic valve regurgitation is trivial. Aortic valve sclerosis is present, with no evidence of aortic valve stenosis. Aortic valve mean gradient measures 8.5 mmHg. Aortic valve peak gradient measures 14.8 mmHg. Aortic valve area, by VTI measures 1.90 cm. Pulmonic Valve: The pulmonic valve was not well visualized. Pulmonic valve regurgitation is not visualized. No evidence of pulmonic stenosis. Aorta: The aortic root, ascending aorta and aortic arch are all structurally normal, with no evidence of dilitation or obstruction. Venous: The inferior vena cava is normal in size with greater than 50% respiratory variability, suggesting right atrial pressure of 3 mmHg. IAS/Shunts: The atrial septum is grossly normal.  LEFT VENTRICLE PLAX 2D LVIDd:         3.00 cm      Diastology LVIDs:         1.90 cm     LV e' medial:    5.44 cm/s LV PW:         0.80 cm     LV E/e' medial:  13.5 LV IVS:        1.79 cm     LV e' lateral:   7.94 cm/s LVOT diam:     1.63 cm     LV E/e' lateral: 9.3 LV SV:         70 LV SV Index:   44 LVOT Area:     2.10 cm  LV Volumes (MOD) LV vol d, MOD A2C: 38.7 ml LV vol d, MOD A4C: 61.9 ml LV vol s, MOD A2C: 21.0 ml LV vol s, MOD A4C: 24.0 ml LV SV MOD A2C:     17.7 ml  LV SV MOD A4C:     61.9 ml LV SV MOD BP:      27.3 ml RIGHT VENTRICLE RV S prime:     13.50 cm/s TAPSE (M-mode): 1.9 cm LEFT ATRIUM           Index        RIGHT ATRIUM          Index LA diam:      2.70 cm 1.67 cm/m   RA Area:     9.67 cm LA Vol (A2C): 24.4 ml 15.12 ml/m  RA Volume:   18.80 ml 11.65 ml/m LA Vol (A4C): 18.3 ml 11.34 ml/m  AORTIC VALVE AV Area (Vmax):    1.95 cm AV Area (Vmean):   1.95 cm AV Area (VTI):     1.90 cm AV Vmax:           192.25 cm/s AV Vmean:          137.500 cm/s AV VTI:            0.371 m AV Peak Grad:      14.8 mmHg AV Mean Grad:      8.5 mmHg LVOT Vmax:         179.00 cm/s LVOT Vmean:        127.750 cm/s LVOT VTI:          0.336 m LVOT/AV VTI ratio: 0.91  AORTA Ao Root diam: 2.20 cm Ao Asc diam:  3.10 cm MITRAL VALVE MV Area (PHT): 2.34 cm    SHUNTS MV Decel Time: 324 msec    Systemic VTI:  0.34 m MV E velocity: 73.70 cm/s  Systemic Diam: 1.63 cm MV A velocity: 99.80 cm/s MV E/A ratio:  0.74 Misty Bruckner MD Electronically signed by Misty Bruckner MD Signature Date/Time: 03/22/2024/7:52:48 PM    Final     Assessment / Plan:  79 year old female admitted with altered mental status with sepsis secondary to UTI, E. coli bacteremia.  Received IV antibiotics.  Now on Bactrim  DS po twice daily. - Management per the hospitalist.   Acute on chronic anemia secondary to  UGI bleed with Melena x 2 on 1/7, last episode was larger volume and occurred after dinner. No further melena overnight or thus far this morning. Admission Hg 7.6 -> 6.3 on 1/5  -> transfused 1 unit of PRBCs -> Hg 7.9 -> Hg 8. -> today Hg 7.4. Remote history of NSAID induced gastric ulcer in 2013.  EGD 1/8 showed a hiatal hernia otherwise was unremarkable.  Patient scheduled for a colonoscopy Saturday 1/10.  Hemodynamically stable. - Clear liquid diet  - NPO after midnight  - Suprep ordered, first dose 5pm, second dose 11pm - Colonoscopy tomorrow with Dr. Abran, benefits and risks discussed including risk with sedation, risk of bleeding, perforation and infection  - Switch PPI IV bid to po once daily as EGD was normal  - Transfuse for hemoglobin level < 7 or as needed if symptomatic  - Consider IV iron  during this hospitalization - CBC in am  DM type II   CKD stage IIIa   Leukocytosis. WBC 8.9 -> 13.4 -> 12.9.  Afebrile.  Heart murmur. ECHO 03/22/2024 showed LVEF 65 to 70%, normal RV function, trivial MV and TV regurgitation and AV sclerosis without AS.  No chest pain or shortness of breath.  Principal Problem:   Sepsis secondary to UTI [facility]) Active Problems:   Hypertension associated with diabetes (HCC)   Hyperlipidemia associated with type 2 diabetes  mellitus (HCC)   Type 2 diabetes mellitus with hyperglycemia, without long-term current use of insulin  (HCC)   Chronic kidney disease, stage 3a (HCC)   Microcytic anemia   Generalized weakness   QT prolongation   E coli bacteremia   MCI (mild cognitive impairment)   Melena   Acute blood loss anemia   History of peptic ulcer disease     LOS: 5 days   Misty Cervantes  03/23/2024, 10: 22 AM  GI ATTENDING  Interval history and data reviewed.  Patient seen and examined as documented above.  Agree with interval progress Cervantes.  Patient stable.  No further bleeding.  She is for colonoscopy tomorrow to further evaluate iron  deficiency and dark heme positive stools.The nature of the procedure, as well as the risks, benefits, and alternatives were carefully and thoroughly reviewed with the patient. Ample  time for discussion and questions allowed. The patient understood, was satisfied, and agreed to proceed.  Misty Cervantes. Abran Raddle., M.D. [facility] Division of Gastroenterology    "

## 2024-03-23 NOTE — Plan of Care (Signed)
   Problem: Fluid Volume: Goal: Ability to maintain a balanced intake and output will improve Outcome: Progressing

## 2024-03-23 NOTE — Inpatient Diabetes Management (Signed)
 Inpatient Diabetes Program Recommendations  AACE/ADA: New Consensus Statement on Inpatient Glycemic Control (2015)  Target Ranges:  Prepandial:   less than 140 mg/dL      Peak postprandial:   less than 180 mg/dL (1-2 hours)      Critically ill patients:  140 - 180 mg/dL   Lab Results  Component Value Date   GLUCAP 253 (H) 03/23/2024   HGBA1C 10.7 (H) 02/28/2024    Review of Glycemic Control  Latest Reference Range & Units 03/22/24 07:25 03/22/24 11:40 03/22/24 15:36 03/22/24 17:38 03/22/24 21:02 03/23/24 07:47  Glucose-Capillary 70 - 99 mg/dL 773 (H) 837 (H) 882 (H) 136 (H) 329 (H) 253 (H)  (H): Data is abnormally high  Diabetes history: DM2 Outpatient Diabetes medications: Glipizide  10 BID, Metformin  500 mg QD Current orders for Inpatient glycemic control:  Glipizide  10 mg BID Metformin  500 mg BID Novolog  0-15 units TID & 0-5 units QHS  Inpatient Diabetes Program Recommendations:    Please consider:  Glipizide  10 mg BID (home dose).  Thank you, Wyvonna Pinal, MSN, CDCES Diabetes Coordinator Inpatient Diabetes Program [phone] (team pager from 8a-5p)

## 2024-03-23 NOTE — Anesthesia Preprocedure Evaluation (Signed)
"                                    Anesthesia Evaluation  Patient identified by MRN, date of birth, ID band Patient awake    Reviewed: Allergy & Precautions, H&P , NPO status , Patient's Chart, lab work & pertinent test results  Airway Mallampati: III  TM Distance: >3 FB Neck ROM: Full    Dental  (+) Edentulous Upper, Edentulous Lower   Pulmonary neg pulmonary ROS   Pulmonary exam normal breath sounds clear to auscultation       Cardiovascular hypertension (104/38 preop), Pt. on medications Normal cardiovascular exam Rhythm:Regular Rate:Normal  Echo 03/2024: 1. Left ventricular ejection fraction, by estimation, is 65 to 70%. The  left ventricle has normal function. The left ventricle has no regional  wall motion abnormalities. There is moderate asymmetric left ventricular  hypertrophy of the basal-septal  segment. Left ventricular diastolic parameters are consistent with Grade I  diastolic dysfunction (impaired relaxation).   2. Right ventricular systolic function is normal. The right ventricular  size is normal.   3. The mitral valve is normal in structure. Trivial mitral valve  regurgitation. No evidence of mitral stenosis.   4. The aortic valve is tricuspid. There is mild calcification of the  aortic valve. Aortic valve regurgitation is trivial. Aortic valve  sclerosis is present, with no evidence of aortic valve stenosis.   5. The inferior vena cava is normal in size with greater than 50%  respiratory variability, suggesting right atrial pressure of 3 mmHg.     Neuro/Psych  Headaches  negative psych ROS   GI/Hepatic Neg liver ROS, PUD,,,  Endo/Other  diabetes, Well Controlled, Type 2, Oral Hypoglycemic Agents    Renal/GU CRFRenal disease (cr 1.17)  negative genitourinary   Musculoskeletal  (+) Arthritis , Osteoarthritis,    Abdominal   Peds negative pediatric ROS (+)  Hematology  (+) Blood dyscrasia, anemia Hb 7.4, plt 412   Anesthesia Other  Findings   Reproductive/Obstetrics negative OB ROS                              Anesthesia Physical Anesthesia Plan  ASA: 3  Anesthesia Plan: MAC   Post-op Pain Management:    Induction:   PONV Risk Score and Plan: 2 and Propofol  infusion and TIVA  Airway Management Planned: Natural Airway and Simple Face Mask  Additional Equipment: None  Intra-op Plan:   Post-operative Plan:   Informed Consent: I have reviewed the patients History and Physical, chart, labs and discussed the procedure including the risks, benefits and alternatives for the proposed anesthesia with the patient or authorized representative who has indicated his/her understanding and acceptance.       Plan Discussed with: CRNA  Anesthesia Plan Comments:          Anesthesia Quick Evaluation  "

## 2024-03-23 NOTE — Plan of Care (Signed)
  Problem: Fluid Volume: Goal: Ability to maintain a balanced intake and output will improve Outcome: Progressing   Problem: Coping: Goal: Ability to adjust to condition or change in health will improve Outcome: Not Progressing   Problem: Metabolic: Goal: Ability to maintain appropriate glucose levels will improve Outcome: Not Progressing

## 2024-03-23 NOTE — H&P (View-Only) (Signed)
 "    [facility] Progress Note  CC:  Dark stools, anemia   Subjective: She is sitting up in the chair.  Tolerating a clear liquid diet.  No nausea or vomiting.  No abdominal pain.  No BM, melena or blood per the rectum overnight or thus far this morning.  No chest pain or shortness of breath.  No family at the bedside.   Objective:   EGD 03/22/2024: - Normal esophagus.  - Normal stomach. Hiatal hernia.  - Normal examined duodenum.  - No source for GI bleed found.  Vital signs in last 24 hours: Temp:  [97.9 F (36.6 C)-99.1 F (37.3 C)] 99 F (37.2 C) (01/09 0523) Pulse Rate:  [75-94] 89 (01/09 0523) Resp:  [12-25] 18 (01/09 0523) BP: (106-144)/(56-89) 111/88 (01/09 0523) SpO2:  [94 %-100 %] 96 % (01/09 0523) Last BM Date : 03/21/24 General: Alert 79 year old female in no acute distress. Heart: Regular rate and rhythm, systolic murmur. Pulm: Breath sounds clear throughout.  On room air. Abdomen: Soft, nondistended.  Nontender.  Positive bowel sounds to all 4 quadrants. Extremities: No lower extremity edema. Neurologic:  Alert and oriented x 4. Grossly normal neurologically. Psych:  Alert and cooperative. Normal mood and affect.  Intake/Output from previous day: 01/08 0701 - 01/09 0700 In: 1253.6 [P.O.:840; I.V.:413.6] Out: 825 [Urine:825] Intake/Output this shift: No intake/output data recorded.  Lab Results: Recent Labs    03/21/24 0441 03/22/24 0513 03/23/24 0519  WBC 8.9 13.4* 12.9*  HGB 7.9* 8.2* 7.4*  HCT 26.3* 26.7* 24.8*  PLT 351 407* 412*   BMET Recent Labs    03/21/24 0441 03/22/24 0513 03/23/24 0519  NA 134* 134* 133*  K 4.0 3.7 4.3  CL 101 101 101  CO2 20* 21* 20*  GLUCOSE 192* 243* 225*  BUN 20 16 18   CREATININE 0.99 0.90 1.17*  CALCIUM  8.6* 8.7* 8.5*   LFT No results for input(s): PROT, ALBUMIN, AST, ALT, ALKPHOS, BILITOT, BILIDIR, IBILI in the last 72 hours. PT/INR No results for input(s): LABPROT,  INR in the last 72 hours. Hepatitis Panel No results for input(s): HEPBSAG, HCVAB, HEPAIGM, HEPBIGM in the last 72 hours.  ECHOCARDIOGRAM COMPLETE Result Date: 03/22/2024    ECHOCARDIOGRAM REPORT   Patient Name:   [name] Date of Exam: 03/22/2024 Medical Rec #:  [id number]       Height:       59.0 in Accession #:    [id number]      Weight:       146.0 lb Date of Birth:  11-13-1945      BSA:          1.613 m Patient Age:    15 years        BP:           143/56 mmHg Patient Gender: F               HR:           89 bpm. Exam Location:  Inpatient Procedure: 2D Echo, Cardiac Doppler and Color Doppler (Both Spectral and Color            Flow Doppler were utilized during procedure). Indications:    Murmur R01.1  History:        Patient has no prior history of Echocardiogram examinations.                 Risk Factors:Diabetes.  Sonographer:    Sydnee Wilson RDCS Referring Phys: [phone]  BINAYA DAHAL IMPRESSIONS  1. Left ventricular ejection fraction, by estimation, is 65 to 70%. The left ventricle has normal function. The left ventricle has no regional wall motion abnormalities. There is moderate asymmetric left ventricular hypertrophy of the basal-septal segment. Left ventricular diastolic parameters are consistent with Grade I diastolic dysfunction (impaired relaxation).  2. Right ventricular systolic function is normal. The right ventricular size is normal.  3. The mitral valve is normal in structure. Trivial mitral valve regurgitation. No evidence of mitral stenosis.  4. The aortic valve is tricuspid. There is mild calcification of the aortic valve. Aortic valve regurgitation is trivial. Aortic valve sclerosis is present, with no evidence of aortic valve stenosis.  5. The inferior vena cava is normal in size with greater than 50% respiratory variability, suggesting right atrial pressure of 3 mmHg. Comparison(s): No prior Echocardiogram. Conclusion(s)/Recommendation(s): Otherwise normal echocardiogram,  with minor abnormalities described in the report. FINDINGS  Left Ventricle: Left ventricular ejection fraction, by estimation, is 65 to 70%. The left ventricle has normal function. The left ventricle has no regional wall motion abnormalities. The left ventricular internal cavity size was normal in size. There is  moderate asymmetric left ventricular hypertrophy of the basal-septal segment. Left ventricular diastolic parameters are consistent with Grade I diastolic dysfunction (impaired relaxation). Right Ventricle: The right ventricular size is normal. No increase in right ventricular wall thickness. Right ventricular systolic function is normal. Left Atrium: Left atrial size was normal in size. Right Atrium: Right atrial size was normal in size. Pericardium: There is no evidence of pericardial effusion. Mitral Valve: The mitral valve is normal in structure. Trivial mitral valve regurgitation. No evidence of mitral valve stenosis. Tricuspid Valve: The tricuspid valve is not well visualized. Tricuspid valve regurgitation is trivial. No evidence of tricuspid stenosis. Aortic Valve: The aortic valve is tricuspid. There is mild calcification of the aortic valve. Aortic valve regurgitation is trivial. Aortic valve sclerosis is present, with no evidence of aortic valve stenosis. Aortic valve mean gradient measures 8.5 mmHg. Aortic valve peak gradient measures 14.8 mmHg. Aortic valve area, by VTI measures 1.90 cm. Pulmonic Valve: The pulmonic valve was not well visualized. Pulmonic valve regurgitation is not visualized. No evidence of pulmonic stenosis. Aorta: The aortic root, ascending aorta and aortic arch are all structurally normal, with no evidence of dilitation or obstruction. Venous: The inferior vena cava is normal in size with greater than 50% respiratory variability, suggesting right atrial pressure of 3 mmHg. IAS/Shunts: The atrial septum is grossly normal.  LEFT VENTRICLE PLAX 2D LVIDd:         3.00 cm      Diastology LVIDs:         1.90 cm     LV e' medial:    5.44 cm/s LV PW:         0.80 cm     LV E/e' medial:  13.5 LV IVS:        1.79 cm     LV e' lateral:   7.94 cm/s LVOT diam:     1.63 cm     LV E/e' lateral: 9.3 LV SV:         70 LV SV Index:   44 LVOT Area:     2.10 cm  LV Volumes (MOD) LV vol d, MOD A2C: 38.7 ml LV vol d, MOD A4C: 61.9 ml LV vol s, MOD A2C: 21.0 ml LV vol s, MOD A4C: 24.0 ml LV SV MOD A2C:     17.7 ml  LV SV MOD A4C:     61.9 ml LV SV MOD BP:      27.3 ml RIGHT VENTRICLE RV S prime:     13.50 cm/s TAPSE (M-mode): 1.9 cm LEFT ATRIUM           Index        RIGHT ATRIUM          Index LA diam:      2.70 cm 1.67 cm/m   RA Area:     9.67 cm LA Vol (A2C): 24.4 ml 15.12 ml/m  RA Volume:   18.80 ml 11.65 ml/m LA Vol (A4C): 18.3 ml 11.34 ml/m  AORTIC VALVE AV Area (Vmax):    1.95 cm AV Area (Vmean):   1.95 cm AV Area (VTI):     1.90 cm AV Vmax:           192.25 cm/s AV Vmean:          137.500 cm/s AV VTI:            0.371 m AV Peak Grad:      14.8 mmHg AV Mean Grad:      8.5 mmHg LVOT Vmax:         179.00 cm/s LVOT Vmean:        127.750 cm/s LVOT VTI:          0.336 m LVOT/AV VTI ratio: 0.91  AORTA Ao Root diam: 2.20 cm Ao Asc diam:  3.10 cm MITRAL VALVE MV Area (PHT): 2.34 cm    SHUNTS MV Decel Time: 324 msec    Systemic VTI:  0.34 m MV E velocity: 73.70 cm/s  Systemic Diam: 1.63 cm MV A velocity: 99.80 cm/s MV E/A ratio:  0.74 Misty Bruckner MD Electronically signed by Misty Bruckner MD Signature Date/Time: 03/22/2024/7:52:48 PM    Final     Assessment / Plan:  79 year old female admitted with altered mental status with sepsis secondary to UTI, E. coli bacteremia.  Received IV antibiotics.  Now on Bactrim  DS po twice daily. - Management per the hospitalist.   Acute on chronic anemia secondary to  UGI bleed with Melena x 2 on 1/7, last episode was larger volume and occurred after dinner. No further melena overnight or thus far this morning. Admission Hg 7.6 -> 6.3 on 1/5  -> transfused 1 unit of PRBCs -> Hg 7.9 -> Hg 8. -> today Hg 7.4. Remote history of NSAID induced gastric ulcer in 2013.  EGD 1/8 showed a hiatal hernia otherwise was unremarkable.  Patient scheduled for a colonoscopy Saturday 1/10.  Hemodynamically stable. - Clear liquid diet  - NPO after midnight  - Suprep ordered, first dose 5pm, second dose 11pm - Colonoscopy tomorrow with Dr. Abran, benefits and risks discussed including risk with sedation, risk of bleeding, perforation and infection  - Switch PPI IV bid to po once daily as EGD was normal  - Transfuse for hemoglobin level < 7 or as needed if symptomatic  - Consider IV iron  during this hospitalization - CBC in am  DM type II   CKD stage IIIa   Leukocytosis. WBC 8.9 -> 13.4 -> 12.9.  Afebrile.  Heart murmur. ECHO 03/22/2024 showed LVEF 65 to 70%, normal RV function, trivial MV and TV regurgitation and AV sclerosis without AS.  No chest pain or shortness of breath.  Principal Problem:   Sepsis secondary to UTI [facility]) Active Problems:   Hypertension associated with diabetes (HCC)   Hyperlipidemia associated with type 2 diabetes  mellitus (HCC)   Type 2 diabetes mellitus with hyperglycemia, without long-term current use of insulin  (HCC)   Chronic kidney disease, stage 3a (HCC)   Microcytic anemia   Generalized weakness   QT prolongation   E coli bacteremia   MCI (mild cognitive impairment)   Melena   Acute blood loss anemia   History of peptic ulcer disease     LOS: 5 days   Misty Cervantes  03/23/2024, 10: 22 AM  GI ATTENDING  Interval history and data reviewed.  Patient seen and examined as documented above.  Agree with interval progress note.  Patient stable.  No further bleeding.  She is for colonoscopy tomorrow to further evaluate iron  deficiency and dark heme positive stools.The nature of the procedure, as well as the risks, benefits, and alternatives were carefully and thoroughly reviewed with the patient. Ample  time for discussion and questions allowed. The patient understood, was satisfied, and agreed to proceed.  Misty Cervantes. Misty Cervantes., M.D. [facility] Division of Gastroenterology    "

## 2024-03-24 ENCOUNTER — Encounter (HOSPITAL_COMMUNITY): Admitting: Anesthesiology

## 2024-03-24 ENCOUNTER — Inpatient Hospital Stay (HOSPITAL_COMMUNITY): Admitting: Anesthesiology

## 2024-03-24 ENCOUNTER — Encounter (HOSPITAL_COMMUNITY)
Admission: EM | Disposition: A | Discharge status: Home / Self Care | Payer: Self-pay | Source: Home / Self Care | Attending: Internal Medicine

## 2024-03-24 ENCOUNTER — Inpatient Hospital Stay (HOSPITAL_COMMUNITY)

## 2024-03-24 ENCOUNTER — Encounter (HOSPITAL_COMMUNITY): Payer: Self-pay | Admitting: Student

## 2024-03-24 DIAGNOSIS — I129 Hypertensive chronic kidney disease with stage 1 through stage 4 chronic kidney disease, or unspecified chronic kidney disease: Secondary | ICD-10-CM

## 2024-03-24 DIAGNOSIS — K6389 Other specified diseases of intestine: Secondary | ICD-10-CM

## 2024-03-24 DIAGNOSIS — K5730 Diverticulosis of large intestine without perforation or abscess without bleeding: Secondary | ICD-10-CM

## 2024-03-24 DIAGNOSIS — D500 Iron deficiency anemia secondary to blood loss (chronic): Secondary | ICD-10-CM

## 2024-03-24 DIAGNOSIS — K921 Melena: Secondary | ICD-10-CM | POA: Diagnosis not present

## 2024-03-24 DIAGNOSIS — E1122 Type 2 diabetes mellitus with diabetic chronic kidney disease: Secondary | ICD-10-CM | POA: Diagnosis not present

## 2024-03-24 DIAGNOSIS — N1831 Chronic kidney disease, stage 3a: Secondary | ICD-10-CM

## 2024-03-24 DIAGNOSIS — N390 Urinary tract infection, site not specified: Secondary | ICD-10-CM | POA: Diagnosis not present

## 2024-03-24 DIAGNOSIS — D509 Iron deficiency anemia, unspecified: Secondary | ICD-10-CM | POA: Diagnosis not present

## 2024-03-24 DIAGNOSIS — C180 Malignant neoplasm of cecum: Secondary | ICD-10-CM | POA: Diagnosis not present

## 2024-03-24 DIAGNOSIS — A419 Sepsis, unspecified organism: Secondary | ICD-10-CM | POA: Diagnosis not present

## 2024-03-24 DIAGNOSIS — R195 Other fecal abnormalities: Secondary | ICD-10-CM

## 2024-03-24 HISTORY — PX: BIOPSY OF SKIN SUBCUTANEOUS TISSUE AND/OR MUCOUS MEMBRANE: SHX6741

## 2024-03-24 HISTORY — PX: COLONOSCOPY: SHX5424

## 2024-03-24 LAB — CBC
HCT: 24.9 % — ABNORMAL LOW (ref 36.0–46.0)
Hemoglobin: 7.5 g/dL — ABNORMAL LOW (ref 12.0–15.0)
MCH: 24 pg — ABNORMAL LOW (ref 26.0–34.0)
MCHC: 30.1 g/dL (ref 30.0–36.0)
MCV: 79.6 fL — ABNORMAL LOW (ref 80.0–100.0)
Platelets: 458 K/uL — ABNORMAL HIGH (ref 150–400)
RBC: 3.13 MIL/uL — ABNORMAL LOW (ref 3.87–5.11)
RDW: 16.4 % — ABNORMAL HIGH (ref 11.5–15.5)
WBC: 13.6 K/uL — ABNORMAL HIGH (ref 4.0–10.5)
nRBC: 0 % (ref 0.0–0.2)

## 2024-03-24 LAB — GLUCOSE, CAPILLARY
Glucose-Capillary: 211 mg/dL — ABNORMAL HIGH (ref 70–99)
Glucose-Capillary: 151 mg/dL — ABNORMAL HIGH (ref 70–99)
Glucose-Capillary: 147 mg/dL — ABNORMAL HIGH (ref 70–99)
Glucose-Capillary: 136 mg/dL — ABNORMAL HIGH (ref 70–99)

## 2024-03-24 LAB — BASIC METABOLIC PANEL WITH GFR
Anion gap: 13 (ref 5–15)
BUN: 14 mg/dL (ref 8–23)
CO2: 20 mmol/L — ABNORMAL LOW (ref 22–32)
Calcium: 8.4 mg/dL — ABNORMAL LOW (ref 8.9–10.3)
Chloride: 103 mmol/L (ref 98–111)
Creatinine, Ser: 1.15 mg/dL — ABNORMAL HIGH (ref 0.44–1.00)
GFR, Estimated: 48 mL/min — ABNORMAL LOW
Glucose, Bld: 212 mg/dL — ABNORMAL HIGH (ref 70–99)
Potassium: 3.6 mmol/L (ref 3.5–5.1)
Sodium: 136 mmol/L (ref 135–145)

## 2024-03-24 LAB — CULTURE, BLOOD (ROUTINE X 2)
Culture: NO GROWTH
Special Requests: ADEQUATE

## 2024-03-24 MED ORDER — AMISULPRIDE (ANTIEMETIC) 5 MG/2ML IV SOLN: 10.0000 mg | Freq: Once | INTRAVENOUS | Status: DC | PRN

## 2024-03-24 MED ORDER — ACETAMINOPHEN 10 MG/ML IV SOLN: 1000.0000 mg | Freq: Once | INTRAVENOUS | Status: DC | PRN

## 2024-03-24 MED ORDER — EPHEDRINE SULFATE (PRESSORS) 25 MG/5ML IV SOSY
PREFILLED_SYRINGE | INTRAVENOUS | Status: DC | PRN
  Administered 2024-03-24: 10 mg via INTRAVENOUS

## 2024-03-24 MED ORDER — ONDANSETRON HCL 4 MG/2ML IJ SOLN
INTRAMUSCULAR | Status: DC | PRN
  Administered 2024-03-24: 4 mg via INTRAVENOUS

## 2024-03-24 MED ORDER — IOHEXOL 300 MG/ML  SOLN
80.0000 mL | Freq: Once | INTRAMUSCULAR | Status: AC | PRN
  Administered 2024-03-24: 80 mL via INTRAVENOUS

## 2024-03-24 MED ORDER — PROPOFOL 10 MG/ML IV BOLUS
INTRAVENOUS | Status: AC
  Filled 2024-03-24: qty 20

## 2024-03-24 MED ORDER — HYDROXYZINE HCL 25 MG PO TABS
25.0000 mg | ORAL_TABLET | Freq: Once | ORAL | Status: AC
  Administered 2024-03-24: 25 mg via ORAL
  Filled 2024-03-24: qty 1

## 2024-03-24 MED ORDER — OXYCODONE HCL 5 MG PO TABS: 5.0000 mg | ORAL_TABLET | Freq: Once | ORAL | Status: DC | PRN

## 2024-03-24 MED ORDER — OXYCODONE HCL 5 MG/5ML PO SOLN: 5.0000 mg | Freq: Once | ORAL | Status: DC | PRN

## 2024-03-24 MED ORDER — LIDOCAINE HCL (CARDIAC) PF 100 MG/5ML IV SOSY
PREFILLED_SYRINGE | INTRAVENOUS | Status: DC | PRN
  Administered 2024-03-24: 50 mg via INTRAVENOUS

## 2024-03-24 MED ORDER — FENTANYL CITRATE (PF) 100 MCG/2ML IJ SOLN: 25.0000 ug | INTRAMUSCULAR | Status: DC | PRN

## 2024-03-24 MED ORDER — METFORMIN HCL 500 MG PO TABS: 1000.0000 mg | ORAL_TABLET | Freq: Two times a day (BID) | ORAL | Status: DC

## 2024-03-24 MED ORDER — PROPOFOL 500 MG/50ML IV EMUL
INTRAVENOUS | Status: DC | PRN
  Administered 2024-03-24: 30 mg via INTRAVENOUS
  Administered 2024-03-24: 125 ug/kg/min via INTRAVENOUS

## 2024-03-24 MED ORDER — PHENYLEPHRINE HCL (PRESSORS) 10 MG/ML IV SOLN
INTRAVENOUS | Status: DC | PRN
  Administered 2024-03-24 (×2): 160 ug via INTRAVENOUS

## 2024-03-24 MED ORDER — METFORMIN HCL 500 MG PO TABS
1000.0000 mg | ORAL_TABLET | Freq: Two times a day (BID) | ORAL | Status: DC
  Administered 2024-03-24: 1000 mg via ORAL
  Filled 2024-03-24: qty 2

## 2024-03-24 NOTE — Anesthesia Postprocedure Evaluation (Addendum)
"   Anesthesia Post Note  Patient: Misty Cervantes  Procedure(s) Performed: COLONOSCOPY BIOPSY, SKIN, SUBCUTANEOUS TISSUE, OR MUCOUS MEMBRANE     Patient location during evaluation: PACU Anesthesia Type: MAC Level of consciousness: awake and alert Pain management: pain level controlled Vital Signs Assessment: post-procedure vital signs reviewed and stable Respiratory status: spontaneous breathing, nonlabored ventilation and respiratory function stable Cardiovascular status: blood pressure returned to baseline and stable Postop Assessment: no apparent nausea or vomiting Anesthetic complications: no   No notable events documented.  Last Vitals:  Vitals:   03/24/24 1107 03/24/24 1126  BP: 112/87 (!) 115/46  Pulse: (!) 113 82  Resp: 15 16  Temp:  36.6 C  SpO2: 96% 100%    Last Pain:  Vitals:   03/24/24 1126  TempSrc: Oral  PainSc: 0-No pain                 Misty Cervantes      "

## 2024-03-24 NOTE — Transfer of Care (Signed)
 Immediate Anesthesia Transfer of Care Note  Patient: Misty Cervantes  Procedure(s) Performed: Procedures: COLONOSCOPY (N/A) BIOPSY, SKIN, SUBCUTANEOUS TISSUE, OR MUCOUS MEMBRANE  Patient Location: PACU  Anesthesia Type:MAC  Level of Consciousness:  sedated, patient cooperative and responds to stimulation  Airway & Oxygen Therapy:Patient Spontanous Breathing and Patient connected to face mask oxgen  Post-op Assessment:  Report given to PACU RN and Post -op Vital signs reviewed and stable  Post vital signs:  Reviewed and stable  Last Vitals:  Vitals:   03/24/24 0925 03/24/24 1047  BP: (!) 125/54 115/64  Pulse: 86 78  Resp: (!) 22 12  Temp: 36.7 C 36.7 C  SpO2: 96% 100%    Complications: No apparent anesthesia complications

## 2024-03-24 NOTE — Progress Notes (Signed)
 Communicated with Dr. Arlice about pt's BP's- they have been running soft since procedure this am. Pt asymptomatic with no distress. Otherwise VSS. No new orders.

## 2024-03-24 NOTE — Plan of Care (Signed)
   Problem: Skin Integrity: Goal: Risk for impaired skin integrity will decrease Outcome: Progressing

## 2024-03-24 NOTE — Interval H&P Note (Signed)
 History and Physical Interval Note:  03/24/2024 9:32 AM  [name]  has presented today for surgery, with the diagnosis of Anemia, GI bleed.  The various methods of treatment have been discussed with the patient and family. After consideration of risks, benefits and other options for treatment, the patient has consented to  Procedures: COLONOSCOPY (N/A) as a surgical intervention.  The patient's history has been reviewed, patient examined, no change in status, stable for surgery.  I have reviewed the patient's chart and labs.  Questions were answered to the patient's satisfaction.     Norleen Kiang

## 2024-03-24 NOTE — Progress Notes (Signed)
 " PROGRESS NOTE  Misty Cervantes  DOB: 24-Mar-1945  PCP: Lucius Krabbe, NP FMW:[id number]  DOA: 03/18/2024  LOS: 6 days  Hospital Day: 7  Subjective: Patient was seen and examined this afternoon. Lying on bed.  Not in distress.  Granddaughter is at bedside. Underwent colonoscopy this morning.  Noted to have possible ileocecal malignancy. Afebrile, hemodynamically stable Labs this morning with WBC count higher at 13.6, hemoglobin low but stable at 7.5, blood sugar level over 200, renal function is stable  Brief narrative: Misty Cervantes is a 79 y.o. female with PMH significant for T2DM, HTN, HLD, CKD 3a, dysphagia, osteoarthritis, h/o gastric ulcers 1/4, patient presented to ED with complaint of weakness, nausea, and confusion.   Was noted to have fever to 102.6 with tachycardia,  Urinalysis showed hazy straw-colored urine with moderate leukocytes, many bacteria Started on Vancomycin /Cefepime /Metronidazole .  Also Hgb was low at 7.6 on arrival Admitted to [facility] Blood culture and urine culture sent on admission grew E. Coli  Her hospital course was complicated by acute drop in hemoglobin.  GI was consulted. 1/10, underwent colonoscopy which showed potential malignant tumor at the ileocecal valve.  It was biopsied.  Assessment and plan: Sepsis secondary to UTI  E coli bacteremia and UTI Patient with hx of UTIs presented with generalized weakness, nausea, fevers and mild confusion (resolved) Urinalysis shows signs of infection, Met sepsis criteria with tachycardia, fever, tachypnea and evidence of urinary infection Blood culture urine culture grew E. Coli. Initially improved with IV Rocephin  and later transition to oral Bactrim  to complete 7-day course on 1/10. Repeat blood culture sent on 1/6 has not shown any growth so far. Recent Labs  Lab 03/18/24 1721 03/19/24 0428 03/20/24 0454 03/21/24 0441 03/22/24 0513 03/23/24 0519 03/24/24 0520  WBC  --    < > 8.4 8.9 13.4*  12.9* 13.6*  LATICACIDVEN 1.6  --   --   --   --   --   --    < > = values in this interval not displayed.   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 Hemoglobin at the lowest of 6.3 on 1/5.  Anemia panel showed low ferritin and low iron  level.  Given monitor PRBC transfusion on 1/5.  [facility] consulted. 1/8, underwent EGD which was unremarkable. 1/10, underwent colonoscopy which showed potential malignant tumor at the ileocecal valve.  It was biopsied.  As recommended by GI, ordered for CT scan of chest abdomen and pelvis to look for metastasis.  I paged general surgery for potential need right hemicolectomy Recent Labs    03/19/24 0431 03/19/24 1631 03/20/24 0454 03/21/24 0441 03/22/24 0513 03/23/24 0519 03/24/24 0520  HGB  --    < > 7.9* 7.9* 8.2* 7.4* 7.5*  MCV  --   --  80.1 81.4 79.5* 80.3 79.6*  VITAMINB12 2,598*  --   --   --   --   --   --   FERRITIN 52  --   --   --   --   --   --   TIBC 357  --   --   --   --   --   --   IRON  <10*  --   --   --   --   --   --    < > = values in this interval not displayed.   Acute multiple encephalopathy  MCI (mild cognitive impairment) Was confused at presentation likely  due to UTI.  Family also has concerns about developing dementia at home. Had episodes of sundowning in the hospital. Mental status gradually improved to normal.  Able to have meaningful conversation. Continue delirium precautions Outpatient neurology follow-up.  Type 2 diabetes mellitus uncontrolled with hyperglycemia A1c 10.7 on 02/28/2024 PTA meds-glipizide , metformin , Jardiance  Diabetes care coordinator consult appreciated 1/8 glipizide , metformin  were resumed.  Jardiance  on hold because of risk of recurrent UTI Blood sugar level consistently running elevated to over 200.   I stopped metformin .  Basal contrast medium today for CT scan Avoid Jardiance  because of UTI Continue SSI with Accu-Cheks. Recent  Labs  Lab 03/23/24 1734 03/23/24 1735 03/23/24 2136 03/24/24 0724 03/24/24 1129  GLUCAP 81 116* 261* 211* 151*   HTN Blood pressure currently controlled on losartan  50 mg daily.  HLD Continue simvastatin  CKD 3A Hypokalemia Mild hyponatremia Creatinine stable at baseline Sodium level mildly low.  Continue to monitor Potassium level improved with replacement Recent Labs  Lab 03/19/24 0428 03/20/24 0454 03/21/24 0441 03/22/24 0513 03/23/24 0519 03/24/24 0520  NA 137 137 134* 134* 133* 136  K 3.5 3.1* 4.0 3.7 4.3 3.6  CL 103 107 101 101 101 103  CO2 21* 21* 20* 21* 20* 20*  GLUCOSE 152* 99 192* 243* 225* 212*  BUN 18 18 20 16 18 14   CREATININE 0.96 0.80 0.99 0.90 1.17* 1.15*  CALCIUM  8.4* 8.2* 8.6* 8.7* 8.5* 8.4*  MG 2.3  --   --   --   --   --   PHOS 4.0  --   --   --   --   --    QT prolongation EKG on admission showed prolonged QTc of 514 Avoid QTc prolonging meds and check electrolytes Repeat EKG 1/6 showed improvement in QTc to 463 ms.  Generalized weakness In the setting of acute illness and dehydration PT/OT consulted.  Home health recommended. Fall precautions  Systolic ejection murmur Likely age-related aortic sclerosis, confirmed with echocardiogram 1/8.   Nutrition Status:         Mobility:   PT Orders: Active   PT Follow up Rec: Home Health Pt1/08/2024 1241    Goals of care   Code Status: Full Code     DVT prophylaxis:  Place and maintain sequential compression device Start: 03/19/24 1332   Antimicrobials: Bactrim  till 1/11 Fluid: None Consultants: GI Family Communication: Granddaughters at bedside today.  Status: Inpatient Level of care:  Med-Surg   Patient is from: Home Needs to continue in-hospital care: Pending CT scan of chest abdomen pelvis and general surgery consultation Anticipated d/c to: Ongoing workup    Diet:  Diet Order             Diet clear liquid Room service appropriate? Yes; Fluid consistency:  Thin  Diet effective now                   Scheduled Meds:  glipiZIDE   10 mg Oral BID   insulin  aspart  0-15 Units Subcutaneous TID WC   insulin  aspart  0-5 Units Subcutaneous QHS   losartan   50 mg Oral Daily   ondansetron  (ZOFRAN ) IV  2 mg Intravenous Once   pantoprazole   40 mg Oral Daily   pravastatin   40 mg Oral QHS   sulfamethoxazole -trimethoprim   2 tablet Oral Q12H    PRN meds: acetaminophen  **OR** acetaminophen , bisacodyl , hydrALAZINE , senna-docusate   Infusions:      Antimicrobials: Anti-infectives (From admission, onward)    Start  Dose/Rate Route Frequency Ordered Stop   03/21/24 2200  sulfamethoxazole -trimethoprim  (BACTRIM  DS) 800-160 MG per tablet 2 tablet        2 tablet Oral Every 12 hours 03/21/24 1448 03/25/24 0959   03/20/24 0600  cefTRIAXone  (ROCEPHIN ) 2 g in sodium chloride  0.9 % 100 mL IVPB  Status:  Discontinued        2 g 200 mL/hr over 30 Minutes Intravenous Every 24 hours 03/19/24 0717 03/21/24 1448   03/19/24 0815  cefTRIAXone  (ROCEPHIN ) 1 g in sodium chloride  0.9 % 100 mL IVPB        1 g 200 mL/hr over 30 Minutes Intravenous  Once 03/19/24 0717 03/19/24 0930   03/19/24 0500  cefTRIAXone  (ROCEPHIN ) 1 g in sodium chloride  0.9 % 100 mL IVPB  Status:  Discontinued        1 g 200 mL/hr over 30 Minutes Intravenous Every 24 hours 03/18/24 2117 03/19/24 0717   03/18/24 1715  vancomycin  (VANCOREADY) IVPB 1500 mg/300 mL        1,500 mg 150 mL/hr over 120 Minutes Intravenous  Once 03/18/24 1642 03/18/24 2011   03/18/24 1645  ceFEPIme  (MAXIPIME ) 2 g in sodium chloride  0.9 % 100 mL IVPB        2 g 200 mL/hr over 30 Minutes Intravenous  Once 03/18/24 1640 03/18/24 1800   03/18/24 1645  metroNIDAZOLE  (FLAGYL ) IVPB 500 mg        500 mg 100 mL/hr over 60 Minutes Intravenous  Once 03/18/24 1640 03/18/24 1831   03/18/24 1645  vancomycin  (VANCOCIN ) IVPB 1000 mg/200 mL premix  Status:  Discontinued        1,000 mg 200 mL/hr over 60 Minutes Intravenous   Once 03/18/24 1640 03/18/24 1642       Objective: Vitals:   03/24/24 1126 03/24/24 1337  BP: (!) 115/46 (!) 117/46  Pulse: 82 84  Resp: 16 18  Temp: 97.8 F (36.6 C) 98 F (36.7 C)  SpO2: 100% 94%    Intake/Output Summary (Last 24 hours) at 03/24/2024 1558 Last data filed at 03/24/2024 1337 Gross per 24 hour  Intake 720 ml  Output 350 ml  Net 370 ml   Filed Weights   03/18/24 1615  Weight: 66.2 kg   Weight change:  Body mass index is 29.49 kg/m.   Physical Exam: General exam: Pleasant, elderly Caucasian female.  Not in distress. Skin: No rashes, lesions or ulcers. HEENT: Atraumatic, normocephalic, no obvious bleeding Lungs: Clear to auscultation bilaterally,  CVS: S1, S2, systolic ejection murmur GI/Abd: Soft, nontender, nondistended, bowel sound present,   CNS: Alert, awake, ordered x 3 Psychiatry: Sad affect with the new finding and colonoscopy. Extremities: No pedal edema, no calf tenderness,   Data Review: I have personally reviewed the laboratory data and studies available.  F/u labs ordered Unresulted Labs (From admission, onward)     Start     Ordered   03/25/24 0500  Basic metabolic panel with GFR  Tomorrow morning,   R        03/24/24 1558   03/25/24 0500  CBC with Differential/Platelet  Tomorrow morning,   R        03/24/24 1558            Signed, Chapman Rota, MD [facility] 03/24/2024  "

## 2024-03-24 NOTE — Plan of Care (Signed)

## 2024-03-24 NOTE — Op Note (Signed)
 [facility] Patient Name: Misty Cervantes Procedure Date: 03/24/2024 MRN: [id number] Attending MD: Norleen SAILOR. Abran , MD, [id number] Date of Birth: Nov 29, 1945 CSN: [id number] Age: 79 Admit Type: Inpatient Procedure:                Colonoscopy with biopsies Indications:              Heme positive stool, Melena, Iron  deficiency anemia Providers:                Norleen SAILOR. Abran, MD, Robie Breed, RN,                            Haskel Chris, Technician Referring MD:             [facility] Medicines:                Monitored Anesthesia Care Complications:            No immediate complications. Estimated blood loss:                            None. Estimated Blood Loss:     Estimated blood loss: none. Procedure:                Pre-Anesthesia Assessment:                           - Prior to the procedure, a History and Physical                            was performed, and patient medications and                            allergies were reviewed. The patient's tolerance of                            previous anesthesia was also reviewed. The risks                            and benefits of the procedure and the sedation                            options and risks were discussed with the patient.                            All questions were answered, and informed consent                            was obtained. Prior Anticoagulants: The patient has                            taken no anticoagulant or antiplatelet agents. ASA                            Grade Assessment: II - A patient with mild systemic  disease. After reviewing the risks and benefits,                            the patient was deemed in satisfactory condition to                            undergo the procedure.                           After obtaining informed consent, the colonoscope                            was passed under direct vision. Throughout the                             procedure, the patient's blood pressure, pulse, and                            oxygen saturations were monitored continuously. The                            CF-HQ190L ([id number]) Olympus colonoscope was                            introduced through the anus and advanced to the the                            cecum, identified by appendiceal orifice and                            ileocecal valve. The terminal ileum, ileocecal                            valve, appendiceal orifice, and rectum were                            photographed. The quality of the bowel preparation                            was good. The colonoscopy was performed without                            difficulty. The patient tolerated the procedure                            well. The bowel preparation used was MoviPrep  via                            split dose instruction. Scope In: 10:16:21 AM Scope Out: 10:38:27 AM Scope Withdrawal Time: 0 hours 18 minutes 9 seconds  Total Procedure Duration: 0 hours 22 minutes 6 seconds  Findings:      An ulcerated mass, measuring approximately 3 cm, was found at the       ileocecal valve. This was friable and consistent with malignancy. See  images. This was biopsied with a cold forceps for histology.      The terminal ileum appeared normal.      A few diverticula were found in the sigmoid colon.      The exam was otherwise without abnormality on direct and retroflexion       views. 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                            Biopsied.                           - The examined portion of the ileum was normal.                           - Diverticulosis in the sigmoid colon.                           - The examination was otherwise normal on direct                            and retroflexion views. Moderate Sedation:      none Recommendation:           1. Keep on clear liquid diet                            2. Follow-up biopsies                           3. CEA level                           4. Contrast-enhanced CT scan of the chest abdomen                            pelvis ulcerative cecal mass, rule out metastatic                            disease                           5. Consult general surgery for right hemicolectomy                           . Procedure Code(s):        --- Professional ---                           [phone], Colonoscopy, flexible; with biopsy, single                            or multiple Diagnosis Code(s):        --- Professional ---                           D49.0, Neoplasm of unspecified behavior of  digestive system                           R19.5, Other fecal abnormalities                           K92.1, Melena (includes Hematochezia)                           D50.9, Iron  deficiency anemia, unspecified                           K57.30, Diverticulosis of large intestine without                            perforation or abscess without bleeding CPT copyright 2022 [employer]. All rights reserved. The codes documented in this report are preliminary and upon coder review may  be revised to meet current compliance requirements. Norleen SAILOR. Abran, MD 03/24/2024 10:56:44 AM This report has been signed electronically. Number of Addenda: 0

## 2024-03-25 ENCOUNTER — Encounter (HOSPITAL_COMMUNITY): Payer: Self-pay | Admitting: Internal Medicine

## 2024-03-25 DIAGNOSIS — D490 Neoplasm of unspecified behavior of digestive system: Secondary | ICD-10-CM | POA: Diagnosis not present

## 2024-03-25 DIAGNOSIS — N390 Urinary tract infection, site not specified: Secondary | ICD-10-CM | POA: Diagnosis not present

## 2024-03-25 DIAGNOSIS — D509 Iron deficiency anemia, unspecified: Secondary | ICD-10-CM | POA: Diagnosis not present

## 2024-03-25 DIAGNOSIS — A419 Sepsis, unspecified organism: Secondary | ICD-10-CM | POA: Diagnosis not present

## 2024-03-25 LAB — CULTURE, BLOOD (ROUTINE X 2)
Culture: NO GROWTH
Culture: NO GROWTH
Special Requests: ADEQUATE
Special Requests: ADEQUATE

## 2024-03-25 LAB — CBC WITH DIFFERENTIAL/PLATELET
Abs Immature Granulocytes: 0.42 K/uL — ABNORMAL HIGH (ref 0.00–0.07)
Basophils Absolute: 0 K/uL (ref 0.0–0.1)
Basophils Relative: 0 %
Eosinophils Absolute: 0.1 K/uL (ref 0.0–0.5)
Eosinophils Relative: 1 %
HCT: 25.1 % — ABNORMAL LOW (ref 36.0–46.0)
Hemoglobin: 7.5 g/dL — ABNORMAL LOW (ref 12.0–15.0)
Immature Granulocytes: 4 %
Lymphocytes Relative: 19 %
Lymphs Abs: 1.9 K/uL (ref 0.7–4.0)
MCH: 24 pg — ABNORMAL LOW (ref 26.0–34.0)
MCHC: 29.9 g/dL — ABNORMAL LOW (ref 30.0–36.0)
MCV: 80.2 fL (ref 80.0–100.0)
Monocytes Absolute: 1.1 K/uL — ABNORMAL HIGH (ref 0.1–1.0)
Monocytes Relative: 11 %
Neutro Abs: 6.2 K/uL (ref 1.7–7.7)
Neutrophils Relative %: 65 %
Platelets: 444 K/uL — ABNORMAL HIGH (ref 150–400)
RBC: 3.13 MIL/uL — ABNORMAL LOW (ref 3.87–5.11)
RDW: 16.5 % — ABNORMAL HIGH (ref 11.5–15.5)
WBC: 9.7 K/uL (ref 4.0–10.5)
nRBC: 0 % (ref 0.0–0.2)

## 2024-03-25 LAB — BASIC METABOLIC PANEL WITH GFR
Anion gap: 12 (ref 5–15)
BUN: 9 mg/dL (ref 8–23)
CO2: 21 mmol/L — ABNORMAL LOW (ref 22–32)
Calcium: 8.5 mg/dL — ABNORMAL LOW (ref 8.9–10.3)
Chloride: 105 mmol/L (ref 98–111)
Creatinine, Ser: 1.18 mg/dL — ABNORMAL HIGH (ref 0.44–1.00)
GFR, Estimated: 47 mL/min — ABNORMAL LOW
Glucose, Bld: 70 mg/dL (ref 70–99)
Potassium: 3.5 mmol/L (ref 3.5–5.1)
Sodium: 137 mmol/L (ref 135–145)

## 2024-03-25 LAB — GLUCOSE, CAPILLARY
Glucose-Capillary: 83 mg/dL (ref 70–99)
Glucose-Capillary: 141 mg/dL — ABNORMAL HIGH (ref 70–99)
Glucose-Capillary: 205 mg/dL — ABNORMAL HIGH (ref 70–99)
Glucose-Capillary: 216 mg/dL — ABNORMAL HIGH (ref 70–99)

## 2024-03-25 MED ORDER — HYDROXYZINE HCL 25 MG PO TABS
25.0000 mg | ORAL_TABLET | Freq: Every evening | ORAL | Status: DC | PRN
  Administered 2024-03-25 – 2024-03-27 (×3): 25 mg via ORAL
  Filled 2024-03-25 (×4): qty 1

## 2024-03-25 NOTE — Progress Notes (Signed)
 Mobility Specialist - Progress Note   03/25/24 0930  Mobility  Activity Ambulated with assistance  Level of Assistance Contact guard assist, steadying assist  Assistive Device Front wheel walker  Distance Ambulated (ft) 250 ft  Range of Motion/Exercises Active  Activity Response Tolerated well  Mobility visit 1 Mobility  Mobility Specialist Start Time (ACUTE ONLY) 0915  Mobility Specialist Stop Time (ACUTE ONLY) 0930  Mobility Specialist Time Calculation (min) (ACUTE ONLY) 15 min   Pt was found in bed and agreeable to mobilize. No complaints. At EOS returned to bed with all needs met. Call bell in reach.   Erminio Leos,  Mobility Specialist Can be reached via Secure Chat

## 2024-03-25 NOTE — Progress Notes (Signed)
 HISTORY OF PRESENT ILLNESS:  Misty Cervantes is a 79 y.o. female admitted with urosepsis, dark stools and anemia.  EGD unrevealing.  Colonoscopy yesterday revealed an ulcerative lesion at the ileocecal valve.  Biopsies taken and sent rush.  Still pending.  CT did not show obvious metastatic disease.  She is feeling fine today.  Laboratories: Hemoglobin 7.5 (stable) Basic metabolic panel unremarkable.  Creatinine 1.18  REVIEW OF SYSTEMS:  All non-GI ROS negative. Past Medical History:  Diagnosis Date   Abdominal pain 08/23/2021   Chronic kidney disease (CKD), stage III (moderate) (HCC)    followed by pcp   Dysphagia 01/22/2021   Full dentures    HCAP (healthcare-associated pneumonia) 08/22/2021   Headache 08/23/2021   History of 2019 novel coronavirus disease (COVID-19) 04/05/2019   result in care everywhere ,  per pt mild symptoms that resolved   History of gastric ulcer 2013   non-bleeding   Hyperlipemia    Hypokalemia    Hyponatremia 08/23/2021   Low serum potassium level 01/29/2021   Mixed stress and urge incontinence    Osteoarthritis    Presence of pessary    Respiratory distress 11/28/2021   Sepsis (HCC) 11/28/2021   Type 2 diabetes mellitus (HCC)    followed by pcp   (08-26-2020 per pt checks blood sugar twice weekly, unsure what fasting sugar is)   UTI (urinary tract infection) 08/23/2021   Vaginal vault 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    gyn--- dr jannis, using pessary   Wears glasses     Past Surgical History:  Procedure Laterality Date   BIOPSY OF SKIN SUBCUTANEOUS TISSUE AND/OR MUCOUS MEMBRANE  03/24/2024   Procedure: BIOPSY, SKIN, SUBCUTANEOUS TISSUE, OR MUCOUS MEMBRANE;  Surgeon: Abran Norleen SAILOR, MD;  Location: [facility];  Service: Gastroenterology;;   ORIN MEDIATE RELEASE Right    1980s   CATARACT EXTRACTION W/ INTRAOCULAR LENS IMPLANT Bilateral 2013   approx   COLONOSCOPY N/A 03/24/2024   Procedure: COLONOSCOPY;  Surgeon: Abran Norleen SAILOR, MD;  Location: [facility];  Service: Gastroenterology;  Laterality: N/A;   COLONOSCOPY WITH ESOPHAGOGASTRODUODENOSCOPY (EGD)  11/17/2011   COLPOCLEISIS N/A 09/02/2020   Procedure: COLPOCLEISIS, PERINEORRHAPHY;  Surgeon: Jannis Kate Norris, MD;  Location: [facility];  Service: Gynecology;  Laterality: N/A;   CYSTOSCOPY N/A 09/02/2020   Procedure: CYSTOSCOPY;  Surgeon: Jannis Kate Norris, MD;  Location: [facility];  Service: Gynecology;  Laterality: N/A;   ESOPHAGOGASTRODUODENOSCOPY N/A 03/22/2024   Procedure: EGD (ESOPHAGOGASTRODUODENOSCOPY);  Surgeon: Abran Norleen SAILOR, MD;  Location: [facility];  Service: Gastroenterology;  Laterality: N/A;   RETINAL DETACHMENT SURGERY Right 2003   TOTAL HIP ARTHROPLASTY Left 03/02/2010   @[facility]    TOTAL KNEE ARTHROPLASTY Right 11/03/2015   Procedure: RIGHT TOTAL KNEE ARTHROPLASTY;  Surgeon: Dempsey Moan, MD;  Location: [facility];  Service: Orthopedics;  Laterality: Right;   TOTAL VAGINAL HYSTERECTOMY  2012   TVH, anterior repair, TVT (no mesh used for the anterior repair)    Social History Misty Cervantes  reports that she has never smoked. She has never used smokeless tobacco. She reports that she does not drink alcohol and does not use drugs.  family history includes Diabetes in her mother, sister, and sister; Heart attack in her father and mother; Kidney cancer in her father.  Allergies[1]     PHYSICAL EXAMINATION: Vital signs: BP (!) 107/57 (BP Location: Left Arm)   Pulse 75   Temp 98.3 F (36.8 C) (Oral)   Resp 17  Ht 4' 11 (1.499 m)   Wt 66.2 kg   LMP  (LMP Unknown)   SpO2 97%   BMI 29.49 kg/m  General: Well-developed, well-nourished, no acute distress HEENT: Sclerae are anicteric, conjunctiva pink. Oral mucosa intact Lungs: Clear Heart: Regular Abdomen: soft, nontender, nondistended, no obvious ascites, no peritoneal signs, normal bowel sounds. No organomegaly. Extremities: No edema Psychiatric: alert and oriented x3.  Cooperative   ASSESSMENT:  1.  Ulcerative lesion right colon.  Malignant versus benign.  Biopsies pending. 2.  Multifactorial anemia 3.  Urosepsis   PLAN:  1.  Follow-up biopsies 2.  If biopsies positive for malignancy, then consult general surgery for right hemicolectomy. 3.  If biopsies negative for malignancy, she can go home and follow-up with me outpatient.  I would repeat colonoscopy in 4 to 6 weeks to reevaluate the area of concern. I discussed this with the patient in great detail this morning.  She understands. GI will follow         [1]  Allergies Allergen Reactions   Bacid Other (See Comments)    Unknown    Latex Other (See Comments)    Exact reaction not cited   Norvasc  [Amlodipine ] Swelling

## 2024-03-25 NOTE — Plan of Care (Signed)
  Problem: Coping: Goal: Ability to adjust to condition or change in health will improve Outcome: Progressing   Problem: Nutritional: Goal: Maintenance of adequate nutrition will improve Outcome: Progressing   Problem: Activity: Goal: Risk for activity intolerance will decrease Outcome: Progressing

## 2024-03-25 NOTE — Progress Notes (Signed)
 " PROGRESS NOTE  Misty Cervantes  DOB: May 07, 1945  PCP: Lucius Krabbe, NP FMW:[id number]  DOA: 03/18/2024  LOS: 7 days  Hospital Day: 8  Subjective: Patient was seen and examined this morning Lying down in bed.  Not in distress.  No new symptoms.  Family not at bedside today. Afebrile, hemodynamically stable, breathing room air. Labs this morning with hemoglobin 7.5, WBC count normalized  Brief narrative: Misty Cervantes is a 79 y.o. female with PMH significant for T2DM, HTN, HLD, CKD 3a, dysphagia, osteoarthritis, h/o gastric ulcers 1/4, patient presented to ED with complaint of weakness, nausea, and confusion.   Was noted to have fever to 102.6 with tachycardia,  Urinalysis showed hazy straw-colored urine with moderate leukocytes, many bacteria Started on Vancomycin /Cefepime /Metronidazole .  Also Hgb was low at 7.6 on arrival Admitted to [facility] Blood culture and urine culture sent on admission grew E. Coli  Her hospital course was complicated by acute drop in hemoglobin.  GI was consulted. 1/10, underwent colonoscopy which showed potential malignant tumor at the ileocecal valve.  It was biopsied. See below for details  Assessment and plan: Sepsis secondary to UTI  E coli bacteremia and UTI Patient with hx of UTIs presented with generalized weakness, nausea, fevers and mild confusion (resolved) Urinalysis shows signs of infection, Met sepsis criteria with tachycardia, fever, tachypnea and evidence of urinary infection Blood culture urine culture grew E. Coli. Repeat blood culture sent on 1/6 has not shown any growth so far. Initially improved with IV Rocephin  and later transitioned to oral Bactrim  to complete 7-day course on 1/10. Recent Labs  Lab 03/18/24 1721 03/19/24 0428 03/21/24 0441 03/22/24 0513 03/23/24 0519 03/24/24 0520 03/25/24 0503  WBC  --    < > 8.9 13.4* 12.9* 13.6* 9.7  LATICACIDVEN 1.6  --   --   --   --   --   --    < > = values in this interval not  displayed.   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 Hemoglobin at the lowest of 6.3 on 1/5.  Anemia panel showed low ferritin and low iron  level.  Given monitor PRBC transfusion on 1/5.  [facility] consulted. 1/8, underwent EGD which was unremarkable. 1/10, underwent colonoscopy which showed potential malignant tumor at the ileocecal valve.  It was biopsied.  As recommended by GI, chest abdomen and pelvis was obtained.  Interestingly the CT scan did not show any cecal mass, only suggested right-sided colitis infectious versus inflammatory. 1/11, I discussed this with Dr. Abran.  He suggests to wait for biopsy report for confirmation.  If positive, patient needs to be seen by general surgery. Recent Labs    03/19/24 0431 03/19/24 1631 03/21/24 0441 03/22/24 0513 03/23/24 0519 03/24/24 0520 03/25/24 0503  HGB  --    < > 7.9* 8.2* 7.4* 7.5* 7.5*  MCV  --    < > 81.4 79.5* 80.3 79.6* 80.2  VITAMINB12 2,598*  --   --   --   --   --   --   FERRITIN 52  --   --   --   --   --   --   TIBC 357  --   --   --   --   --   --   IRON  <10*  --   --   --   --   --   --    < > = values in this interval  not displayed.   Acute multiple encephalopathy  MCI (mild cognitive impairment) Was confused at presentation likely due to UTI.  Family also has concerns about developing dementia at home. Had episodes of sundowning in the hospital. Mental status gradually improved to normal.  Able to have meaningful conversation. Continue delirium precautions Outpatient neurology follow-up.  Type 2 diabetes mellitus uncontrolled with hyperglycemia A1c 10.7 on 02/28/2024 PTA meds-glipizide , metformin , Jardiance  Diabetes care coordinator consult appreciated 1/8 glipizide , metformin  were resumed.  Jardiance  on hold because of risk of recurrent UTI Metformin  held because of the use of contrast in CT scan Blood sugar level better at 83 morning. Continue SSI  with Accu-Cheks. Recent Labs  Lab 03/24/24 0724 03/24/24 1129 03/24/24 1650 03/24/24 2041 03/25/24 0807  GLUCAP 211* 151* 147* 136* 83   HTN Blood pressure currently controlled on losartan  50 mg daily.  HLD Continue simvastatin  CKD 3A Creatinine stable at baseline Recent Labs  Lab 03/19/24 0428 03/20/24 0454 03/21/24 0441 03/22/24 0513 03/23/24 0519 03/24/24 0520 03/25/24 0503  NA 137   < > 134* 134* 133* 136 137  K 3.5   < > 4.0 3.7 4.3 3.6 3.5  CL 103   < > 101 101 101 103 105  CO2 21*   < > 20* 21* 20* 20* 21*  GLUCOSE 152*   < > 192* 243* 225* 212* 70  BUN 18   < > 20 16 18 14 9   CREATININE 0.96   < > 0.99 0.90 1.17* 1.15* 1.18*  CALCIUM  8.4*   < > 8.6* 8.7* 8.5* 8.4* 8.5*  MG 2.3  --   --   --   --   --   --   PHOS 4.0  --   --   --   --   --   --    < > = values in this interval not displayed.   QT prolongation EKG on admission showed prolonged QTc of 514 Avoid QTc prolonging meds and check electrolytes Repeat EKG 1/6 showed improvement in QTc to 463 ms.  Generalized weakness In the setting of acute illness and dehydration PT/OT consulted.  Home health recommended. Fall precautions  Systolic ejection murmur Likely age-related aortic sclerosis, confirmed with echocardiogram 1/8.   Nutrition Status:         Mobility:   PT Orders: Active   PT Follow up Rec: Home Health Pt1/08/2024 1241    Goals of care   Code Status: Full Code     DVT prophylaxis:  Place and maintain sequential compression device Start: 03/19/24 1332   Antimicrobials: Completed the course of Bactrim  Fluid: None Consultants: GI Family Communication: Granddaughters not at bedside today.  Status: Inpatient Level of care:  Med-Surg   Patient is from: Home Needs to continue in-hospital care: Pending biopsy report.  If positive for malignancy, may need surgical consultation and surgery this admission Anticipated d/c to: Ongoing workup.  Ultimately home with home health  PT.     Diet:  Diet Order             DIET SOFT Room service appropriate? Yes; Fluid consistency: Thin  Diet effective now                   Scheduled Meds:  glipiZIDE   10 mg Oral BID   insulin  aspart  0-15 Units Subcutaneous TID WC   insulin  aspart  0-5 Units Subcutaneous QHS   losartan   50 mg Oral Daily   ondansetron  (ZOFRAN ) IV  2 mg Intravenous Once   pantoprazole   40 mg Oral Daily   pravastatin   40 mg Oral QHS    PRN meds: acetaminophen  **OR** acetaminophen , bisacodyl , hydrALAZINE , senna-docusate   Infusions:      Antimicrobials: Anti-infectives (From admission, onward)    Start     Dose/Rate Route Frequency Ordered Stop   03/21/24 2200  sulfamethoxazole -trimethoprim  (BACTRIM  DS) 800-160 MG per tablet 2 tablet        2 tablet Oral Every 12 hours 03/21/24 1448 03/24/24 2121   03/20/24 0600  cefTRIAXone  (ROCEPHIN ) 2 g in sodium chloride  0.9 % 100 mL IVPB  Status:  Discontinued        2 g 200 mL/hr over 30 Minutes Intravenous Every 24 hours 03/19/24 0717 03/21/24 1448   03/19/24 0815  cefTRIAXone  (ROCEPHIN ) 1 g in sodium chloride  0.9 % 100 mL IVPB        1 g 200 mL/hr over 30 Minutes Intravenous  Once 03/19/24 0717 03/19/24 0930   03/19/24 0500  cefTRIAXone  (ROCEPHIN ) 1 g in sodium chloride  0.9 % 100 mL IVPB  Status:  Discontinued        1 g 200 mL/hr over 30 Minutes Intravenous Every 24 hours 03/18/24 2117 03/19/24 0717   03/18/24 1715  vancomycin  (VANCOREADY) IVPB 1500 mg/300 mL        1,500 mg 150 mL/hr over 120 Minutes Intravenous  Once 03/18/24 1642 03/18/24 2011   03/18/24 1645  ceFEPIme  (MAXIPIME ) 2 g in sodium chloride  0.9 % 100 mL IVPB        2 g 200 mL/hr over 30 Minutes Intravenous  Once 03/18/24 1640 03/18/24 1800   03/18/24 1645  metroNIDAZOLE  (FLAGYL ) IVPB 500 mg        500 mg 100 mL/hr over 60 Minutes Intravenous  Once 03/18/24 1640 03/18/24 1831   03/18/24 1645  vancomycin  (VANCOCIN ) IVPB 1000 mg/200 mL premix  Status:  Discontinued         1,000 mg 200 mL/hr over 60 Minutes Intravenous  Once 03/18/24 1640 03/18/24 1642       Objective: Vitals:   03/25/24 0646 03/25/24 1132  BP: 103/64 (!) 107/57  Pulse: 77 75  Resp: 16 17  Temp: 98.6 F (37 C) 98.3 F (36.8 C)  SpO2: 96% 97%    Intake/Output Summary (Last 24 hours) at 03/25/2024 1155 Last data filed at 03/25/2024 0900 Gross per 24 hour  Intake 960 ml  Output --  Net 960 ml   Filed Weights   03/18/24 1615  Weight: 66.2 kg   Weight change:  Body mass index is 29.49 kg/m.   Physical Exam: General exam: Pleasant, elderly Caucasian female.  Not in distress. Skin: No rashes, lesions or ulcers. HEENT: Atraumatic, normocephalic, no obvious bleeding Lungs: Clear to auscultation bilaterally,  CVS: S1, S2, systolic ejection murmur GI/Abd: Soft, nontender, nondistended, bowel sound present,   CNS: Alert, awake, ordered x 3 Psychiatry: Mood appropriate Extremities: No pedal edema, no calf tenderness,   Data Review: I have personally reviewed the laboratory data and studies available.  F/u labs ordered Unresulted Labs (From admission, onward)    None       Signed, Chapman Rota, MD [facility] 03/25/2024  "

## 2024-03-26 DIAGNOSIS — K633 Ulcer of intestine: Secondary | ICD-10-CM | POA: Diagnosis not present

## 2024-03-26 DIAGNOSIS — D509 Iron deficiency anemia, unspecified: Secondary | ICD-10-CM | POA: Diagnosis not present

## 2024-03-26 DIAGNOSIS — K921 Melena: Secondary | ICD-10-CM

## 2024-03-26 DIAGNOSIS — A419 Sepsis, unspecified organism: Secondary | ICD-10-CM | POA: Diagnosis not present

## 2024-03-26 DIAGNOSIS — N390 Urinary tract infection, site not specified: Secondary | ICD-10-CM | POA: Diagnosis not present

## 2024-03-26 DIAGNOSIS — D374 Neoplasm of uncertain behavior of colon: Secondary | ICD-10-CM

## 2024-03-26 LAB — GLUCOSE, CAPILLARY
Glucose-Capillary: 174 mg/dL — ABNORMAL HIGH (ref 70–99)
Glucose-Capillary: 226 mg/dL — ABNORMAL HIGH (ref 70–99)
Glucose-Capillary: 278 mg/dL — ABNORMAL HIGH (ref 70–99)
Glucose-Capillary: 185 mg/dL — ABNORMAL HIGH (ref 70–99)

## 2024-03-26 MED ORDER — METFORMIN HCL ER 500 MG PO TB24
500.0000 mg | ORAL_TABLET | Freq: Two times a day (BID) | ORAL | Status: DC
  Administered 2024-03-27 (×2): 500 mg via ORAL
  Filled 2024-03-26 (×2): qty 1

## 2024-03-26 NOTE — Progress Notes (Signed)
 "   Progress Note   LOS: 8 days   Chief Complaint: Dark stools/anemia   Subjective   Patient reports she is doing well today.  Has not had a bowel movement.  Tolerating diet without difficulty, currently eating eggs/potatoes/bacon..  Denies pain, nausea, vomiting.  No complaints today   Objective   Vital signs in last 24 hours: Temp:  [98.3 F (36.8 C)-99.3 F (37.4 C)] 98.3 F (36.8 C) (01/12 9366) Pulse Rate:  [72-79] 72 (01/12 0633) Resp:  [15-17] 17 (01/12 9366) BP: (106-115)/(46-58) 115/58 (01/12 9366) SpO2:  [95 %-97 %] 95 % (01/12 9366) Last BM Date : 03/24/24 Last BM recorded by nurses in past 5 days Stool Type: Type 5 (Soft blobs with clear-cut edges) (03/23/2024  8:30 PM)  General:   female in no acute distress  Heart:  Regular rate and rhythm; no murmurs Pulm: Clear anteriorly; no wheezing Abdomen: soft, nondistended, normal bowel sounds in all quadrants. Nontender without guarding. No organomegaly appreciated. Extremities:  No edema Neurologic:  Alert and  oriented x4;  No focal deficits.  Psych:  Cooperative. Normal mood and affect.  Intake/Output from previous day: 01/11 0701 - 01/12 0700 In: 360 [P.O.:360] Out: -  Intake/Output this shift: No intake/output data recorded.  Studies/Results: CT CHEST ABDOMEN PELVIS W CONTRAST Result Date: 03/24/2024 CLINICAL DATA:  Ulcerative cecal mass. Rule out metastatic disease. * Tracking Code: BO * EXAM: CT CHEST, ABDOMEN, AND PELVIS WITH CONTRAST TECHNIQUE: Multidetector CT imaging of the chest, abdomen and pelvis was performed following the standard protocol during bolus administration of intravenous contrast. RADIATION DOSE REDUCTION: This exam was performed according to the departmental dose-optimization program which includes automated exposure control, adjustment of the mA and/or kV according to patient size and/or use of iterative reconstruction technique. CONTRAST:  80mL OMNIPAQUE  IOHEXOL  300 MG/ML  SOLN COMPARISON:   10/23/2023 chest CT. 08/23/2021 chest abdomen and pelvic CTs. FINDINGS: CT CHEST FINDINGS Cardiovascular: Aortic atherosclerosis. Tortuous thoracic aorta. Mild cardiomegaly. Three vessel coronary artery calcification. No central pulmonary embolism, on this non-dedicated study. Mediastinum/Nodes: No supraclavicular adenopathy. No mediastinal or hilar adenopathy. Anterior mediastinal soft tissue density is similar to 2023 including on image 17/2 and favored to be related to degenerative change of the first rib/sternal joint. Lungs/Pleura: Mild cylindrical bronchiectasis is lower lobe predominant and likely postinfectious/inflammatory. Musculoskeletal: Included within the abdomen pelvic section. CT ABDOMEN PELVIS FINDINGS Hepatobiliary: Normal liver. Normal gallbladder, without biliary ductal dilatation. Pancreas: Normal, without mass or ductal dilatation. Spleen: Normal in size, without focal abnormality. Adrenals/Urinary Tract: Normal adrenal glands. Punctate interpolar left renal collecting system calculus. Left-greater-than-right renal sinus cysts, without hydronephrosis. Degraded evaluation of the pelvis, secondary to beam hardening artifact from left hip arthroplasty. Grossly normal urinary bladder. Stomach/Bowel: Normal stomach, without wall thickening. The colon is primarily decompressed. There is mild mucosal hyperenhancement which is most apparent in the ascending segment, including on image 78/2. No well-defined cecal mass. Normal terminal ileum and appendix. Normal small bowel. Vascular/Lymphatic: Aortic atherosclerosis. Nodes adjacent the cecum of maximally 5 mm on image 81/2 are enlarged from maximally 3 mm previously. Reproductive: Hysterectomy.  No adnexal mass. Other: Moderate pelvic floor laxity. No free intraperitoneal air. No significant free fluid. No evidence of omental or peritoneal disease. Musculoskeletal: Left hip arthroplasty. Mild convex left thoracolumbar spine curvature. IMPRESSION: 1.  Right-sided colitis which could be infectious or inflammatory. No well-defined cecal mass identified. 2. Prominent but not pathologically sized ileocolic mesenteric nodes which could be reactive in the setting of colitis or  represent regional nodal metastasis. 3. Otherwise, no evidence of metastatic disease. 4. Incidental findings, including: Coronary artery atherosclerosis. Aortic Atherosclerosis (ICD10-I70.0). Left nephrolithiasis. Electronically Signed   By: Rockey Kilts M.D.   On: 03/24/2024 17:45    Lab Results: Recent Labs    03/24/24 0520 03/25/24 0503  WBC 13.6* 9.7  HGB 7.5* 7.5*  HCT 24.9* 25.1*  PLT 458* 444*   BMET Recent Labs    03/24/24 0520 03/25/24 0503  NA 136 137  K 3.6 3.5  CL 103 105  CO2 20* 21*  GLUCOSE 212* 70  BUN 14 9  CREATININE 1.15* 1.18*  CALCIUM  8.4* 8.5*   LFT No results for input(s): PROT, ALBUMIN, AST, ALT, ALKPHOS, BILITOT, BILIDIR, IBILI in the last 72 hours. PT/INR No results for input(s): LABPROT, INR in the last 72 hours.   Scheduled Meds:  glipiZIDE   10 mg Oral BID   insulin  aspart  0-15 Units Subcutaneous TID WC   insulin  aspart  0-5 Units Subcutaneous QHS   losartan   50 mg Oral Daily   [START ON 03/27/2024] metFORMIN   500 mg Oral BID WC   ondansetron  (ZOFRAN ) IV  2 mg Intravenous Once   pantoprazole   40 mg Oral Daily   pravastatin   40 mg Oral QHS   Continuous Infusions:    Patient profile:   79 year old female admitted with AMS with sepsis secondary to UTI, E. coli bacteremia and found to have acute on chronic anemia secondary to upper GI bleed with melena    Impression/Plan:   Melena Upper GI bleed S/p 1 unit PRBCs 1/5.  Hgb 7.5, stable.  No further bleeding EGD 1/8 with hiatal hernia, otherwise unremarkable Colonoscopy 1/10 with ulcerative lesion at ileocecal valve, biopsies pending CT chest abdomen pelvis 1/10 with right sided colitis, prominent ileocolic mesenteric nodes, no evidence of  metastatic disease - Continue supportive care - No labs have been drawn today, follow repeat CBC/BMP - Continue daily CBC and transfuse as needed to maintain HGB > 7 - Await pathology results - If pathology is negative, repeat colonoscopy 4 to 6 weeks as an outpatient with Dr. Abran - If pathology positive for malignancy, consult general surgery for consideration of hemicolectomy  UTI Urosepsis S/p IV antibiotics now on Bactrim   Diabetes type 2  CKD Cr. 1.18  Heart murmur. ECHO 03/22/2024 showed LVEF 65 to 70%, normal RV function, trivial MV and TV regurgitation and AV sclerosis without AS.  No chest pain or shortness of breath.   Vanita Cannell CHRISTELLA Blower  03/26/2024, 9:42 AM    "

## 2024-03-26 NOTE — Progress Notes (Signed)
 " PROGRESS NOTE  Rock CHRISTELLA Meissner  DOB: 1945/07/01  PCP: Lucius Krabbe, NP FMW:[id number]  DOA: 03/18/2024  LOS: 8 days  Hospital Day: 9  Subjective: Patient was seen and examined this morning. Lying on bed.  Not in distress.  No new symptoms.  Family not at bedside Afebrile, hemodynamically stable, breathing on room air. Labs this morning. Pending surgical pathology report.   Brief narrative: Misty Cervantes is a 79 y.o. female with PMH significant for T2DM, HTN, HLD, CKD 3a, dysphagia, osteoarthritis, h/o gastric ulcers 1/4, patient presented to ED with complaint of weakness, nausea, and confusion.   Was noted to have fever to 102.6 with tachycardia,  Urinalysis showed hazy straw-colored urine with moderate leukocytes, many bacteria Started on Vancomycin /Cefepime /Metronidazole .  Also Hgb was low at 7.6 on arrival Admitted to [facility] Blood culture and urine culture sent on admission grew E. Coli  Her hospital course was complicated by acute drop in hemoglobin.  GI was consulted. 1/10, underwent colonoscopy which showed potential malignant tumor at the ileocecal valve.  It was biopsied. See below for details  Assessment and plan: Sepsis secondary to UTI  E coli bacteremia and UTI Patient with hx of UTIs presented with generalized weakness, nausea, fevers and mild confusion (resolved) Urinalysis shows signs of infection, Met sepsis criteria with tachycardia, fever, tachypnea and evidence of urinary infection Blood culture urine culture grew E. Coli. Repeat blood culture sent on 1/6 has not shown any growth so far. Initially improved with IV Rocephin  and later transitioned to oral Bactrim .  Completed 7-day course on 1/10. 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 Hemoglobin at the lowest of 6.3 on  1/5.  Anemia panel showed low ferritin and low iron  level.  Given monitor PRBC transfusion on 1/5.  [facility] consulted. 1/8, underwent EGD which was unremarkable. 1/10, underwent colonoscopy which showed potential malignant tumor at the ileocecal valve.  It was biopsied.  As recommended by GI, chest abdomen and pelvis was obtained.  Interestingly the CT scan did not show any cecal mass, only suggested right-sided colitis infectious versus inflammatory. Currently pending pathology report. Per GI, if biopsies positive for malignancy, patient needs to be seen by general surgery for consideration of right hemicolectomy.  If biopsy is negative, patient goes home with a plan to follow-up with GI in 4 to 6 weeks for repeat colonoscopy. Recent Labs    03/19/24 0431 03/19/24 1631 03/21/24 0441 03/22/24 0513 03/23/24 0519 03/24/24 0520 03/25/24 0503  HGB  --    < > 7.9* 8.2* 7.4* 7.5* 7.5*  MCV  --    < > 81.4 79.5* 80.3 79.6* 80.2  VITAMINB12 2,598*  --   --   --   --   --   --   FERRITIN 52  --   --   --   --   --   --   TIBC 357  --   --   --   --   --   --   IRON  <10*  --   --   --   --   --   --    < > = values in this interval not displayed.   Acute multiple encephalopathy  MCI (mild cognitive impairment) Was confused at presentation likely due to UTI.  Family also has concerns about  developing dementia at home. Had episodes of sundowning in the hospital. Mental status gradually improved to normal.  Now able to have meaningful conversation. Continue delirium precautions Outpatient neurology follow-up.  Type 2 diabetes mellitus uncontrolled with hyperglycemia A1c 10.7 on 02/28/2024 PTA meds-glipizide , metformin , Jardiance  Diabetes care coordinator consult appreciated Currently on glipizide  10 mg twice daily.  Blood sugar level not adequately controlled. Metformin  was held because of use of contrast for CT.  I would resume metformin  from tomorrow. Continue SSI with  Accu-Cheks. Recent Labs  Lab 03/25/24 0807 03/25/24 1200 03/25/24 1717 03/25/24 2149 03/26/24 0725  GLUCAP 83 141* 205* 216* 174*   HTN Blood pressure currently controlled on losartan  50 mg daily.  HLD Continue simvastatin  CKD 3A Creatinine stable at baseline Recent Labs  Lab 03/21/24 0441 03/22/24 0513 03/23/24 0519 03/24/24 0520 03/25/24 0503  NA 134* 134* 133* 136 137  K 4.0 3.7 4.3 3.6 3.5  CL 101 101 101 103 105  CO2 20* 21* 20* 20* 21*  GLUCOSE 192* 243* 225* 212* 70  BUN 20 16 18 14 9   CREATININE 0.99 0.90 1.17* 1.15* 1.18*  CALCIUM  8.6* 8.7* 8.5* 8.4* 8.5*   Generalized weakness In the setting of acute illness and dehydration PT/OT consulted.  Home health recommended. Fall precautions  Systolic ejection murmur Likely age-related aortic sclerosis, confirmed with echocardiogram 1/8. No need of further workup   Nutrition Status:         Mobility:   PT Orders: Active   PT Follow up Rec: Home Health Pt1/08/2024 1241    Goals of care   Code Status: Full Code     DVT prophylaxis:  Place and maintain sequential compression device Start: 03/19/24 1332   Antimicrobials: Completed the course of Bactrim  Fluid: None Consultants: GI Family Communication: Granddaughters not at bedside today.  Status: Inpatient Level of care:  Med-Surg   Patient is from: Home Needs to continue in-hospital care: Pending biopsy report.  If positive for malignancy, may need surgical consultation and right hemicolectomy this admission Anticipated d/c to: Ongoing workup.  Ultimately home with home health PT.     Diet:  Diet Order             DIET SOFT Room service appropriate? Yes; Fluid consistency: Thin  Diet effective now                   Scheduled Meds:  glipiZIDE   10 mg Oral BID   insulin  aspart  0-15 Units Subcutaneous TID WC   insulin  aspart  0-5 Units Subcutaneous QHS   losartan   50 mg Oral Daily   ondansetron  (ZOFRAN ) IV  2 mg Intravenous  Once   pantoprazole   40 mg Oral Daily   pravastatin   40 mg Oral QHS    PRN meds: acetaminophen  **OR** acetaminophen , bisacodyl , hydrALAZINE , hydrOXYzine , senna-docusate   Infusions:      Antimicrobials: Anti-infectives (From admission, onward)    Start     Dose/Rate Route Frequency Ordered Stop   03/21/24 2200  sulfamethoxazole -trimethoprim  (BACTRIM  DS) 800-160 MG per tablet 2 tablet        2 tablet Oral Every 12 hours 03/21/24 1448 03/24/24 2121   03/20/24 0600  cefTRIAXone  (ROCEPHIN ) 2 g in sodium chloride  0.9 % 100 mL IVPB  Status:  Discontinued        2 g 200 mL/hr over 30 Minutes Intravenous Every 24 hours 03/19/24 0717 03/21/24 1448   03/19/24 0815  cefTRIAXone  (ROCEPHIN ) 1 g in sodium chloride  0.9 %  100 mL IVPB        1 g 200 mL/hr over 30 Minutes Intravenous  Once 03/19/24 0717 03/19/24 0930   03/19/24 0500  cefTRIAXone  (ROCEPHIN ) 1 g in sodium chloride  0.9 % 100 mL IVPB  Status:  Discontinued        1 g 200 mL/hr over 30 Minutes Intravenous Every 24 hours 03/18/24 2117 03/19/24 0717   03/18/24 1715  vancomycin  (VANCOREADY) IVPB 1500 mg/300 mL        1,500 mg 150 mL/hr over 120 Minutes Intravenous  Once 03/18/24 1642 03/18/24 2011   03/18/24 1645  ceFEPIme  (MAXIPIME ) 2 g in sodium chloride  0.9 % 100 mL IVPB        2 g 200 mL/hr over 30 Minutes Intravenous  Once 03/18/24 1640 03/18/24 1800   03/18/24 1645  metroNIDAZOLE  (FLAGYL ) IVPB 500 mg        500 mg 100 mL/hr over 60 Minutes Intravenous  Once 03/18/24 1640 03/18/24 1831   03/18/24 1645  vancomycin  (VANCOCIN ) IVPB 1000 mg/200 mL premix  Status:  Discontinued        1,000 mg 200 mL/hr over 60 Minutes Intravenous  Once 03/18/24 1640 03/18/24 1642       Objective: Vitals:   03/25/24 2151 03/26/24 0633  BP: (!) 115/52 (!) 115/58  Pulse: 79 72  Resp: 15 17  Temp: 98.4 F (36.9 C) 98.3 F (36.8 C)  SpO2: 97% 95%    Intake/Output Summary (Last 24 hours) at 03/26/2024 0924 Last data filed at 03/26/2024  0100 Gross per 24 hour  Intake 360 ml  Output --  Net 360 ml   Filed Weights   03/18/24 1615  Weight: 66.2 kg   Weight change:  Body mass index is 29.49 kg/m.   Physical Exam: General exam: Pleasant, elderly Caucasian female.  Not in distress. Skin: No rashes, lesions or ulcers. HEENT: Atraumatic, normocephalic, no obvious bleeding Lungs: Clear to auscultation bilaterally,  CVS: S1, S2, systolic ejection murmur GI/Abd: Soft, nontender, nondistended, bowel sound present,   CNS: Alert, awake, ordered x 3 Psychiatry: Mood appropriate Extremities: No pedal edema, no calf tenderness,   Data Review: I have personally reviewed the laboratory data and studies available.  F/u labs ordered Unresulted Labs (From admission, onward)    None       Signed, Chapman Rota, MD [facility] 03/26/2024  "

## 2024-03-26 NOTE — Progress Notes (Signed)
 Physical Therapy Treatment Patient Details Name: Misty Cervantes MRN: [id number] DOB: [birth year] Today's Date: 03/26/2024   History of Present Illness Misty Cervantes is a 79 y.o. female who presented to the ED for evaluation of generalized weakness, nausea and confusion dizziness, fever, tachycardic, initiated sepsis protocol in ED. Dx- UTI/sepsis  Hospital course complicated by drop in hgb.  GI consulted and pt underwent colonoscopy on 1/10 showed potential tumor - awaiting biopsyPMH: r T2DM, HTN, HLD, osteoarthritis, CKD 3A, spinal stenosis and insomnia, THA, TKA    PT Comments  Pt making good improvements.  She is mobilizing at supervision level. She does have mild instability without AD (drifts R/L with head turns) but no overt LOB, denies falls at home and reports likely won't use RW.  Educated on safety with mobility.  Reports she is feeling close to baseline, with slight weakness.  Decreased pt's frequency due to progressing well and encouraged continued mobility with nursing and mobility.     If plan is discharge home, recommend the following: A little help with walking and/or transfers;Assist for transportation   Can travel by private vehicle        Equipment Recommendations  None recommended by PT    Recommendations for Other Services       Precautions / Restrictions Precautions Precautions: Fall     Mobility  Bed Mobility Overal bed mobility: Needs Assistance Bed Mobility: Supine to Sit     Supine to sit: Supervision          Transfers Overall transfer level: Needs assistance Equipment used: None Transfers: Sit to/from Stand Sit to Stand: Supervision           General transfer comment: STS x 3 with supervision; performed toileting adls with supervision    Ambulation/Gait Ambulation/Gait assistance: Supervision Gait Distance (Feet): 300 Feet Assistive device: None Gait Pattern/deviations: Step-through pattern, Drifts right/left Gait velocity:  decreased but functional     General Gait Details: Pt reports not using RW at home.  She ambulated 300' without AD but needed close supervision.  Pt at times waivering R/L in hallway particularly with head turns but did not lose balance.   Stairs             Wheelchair Mobility     Tilt Bed    Modified Rankin (Stroke Patients Only)       Balance Overall balance assessment: Needs assistance Sitting-balance support: Feet supported Sitting balance-Leahy Scale: Good     Standing balance support: No upper extremity supported Standing balance-Leahy Scale: Good                              Communication    Cognition Arousal: Alert Behavior During Therapy: WFL for tasks assessed/performed   PT - Cognitive impairments: No apparent impairments                                Cueing    Exercises      General Comments General comments (skin integrity, edema, etc.): VSS, RA with sats 98%      Pertinent Vitals/Pain Pain Assessment Pain Assessment: No/denies pain    Home Living                          Prior Function            PT Goals (current  goals can now be found in the care plan section) Progress towards PT goals: Progressing toward goals    Frequency    Min 1X/week      PT Plan      Co-evaluation              AM-PAC PT 6 Clicks Mobility   Outcome Measure  Help needed turning from your back to your side while in a flat bed without using bedrails?: None Help needed moving from lying on your back to sitting on the side of a flat bed without using bedrails?: None Help needed moving to and from a bed to a chair (including a wheelchair)?: A Little Help needed standing up from a chair using your arms (e.g., wheelchair or bedside chair)?: A Little Help needed to walk in hospital room?: A Little Help needed climbing 3-5 steps with a railing? : A Little 6 Click Score: 20    End of Session   Activity  Tolerance: Patient tolerated treatment well Patient left: in chair;with call bell/phone within reach Nurse Communication: Mobility status PT Visit Diagnosis: Muscle weakness (generalized) (M62.81);Unsteadiness on feet (R26.81)     Time: 1125-1140 PT Time Calculation (min) (ACUTE ONLY): 15 min  Charges:    $Gait Training: 8-22 mins PT General Charges $$ ACUTE PT VISIT: 1 Visit                     Benjiman, PT [facility] [phone]    Benjiman VEAR Mulberry 03/26/2024, 11:54 AM

## 2024-03-26 NOTE — Plan of Care (Signed)
   Problem: Education: Goal: Knowledge of General Education information will improve Description Including pain rating scale, medication(s)/side effects and non-pharmacologic comfort measures Outcome: Progressing   Problem: Health Behavior/Discharge Planning: Goal: Ability to manage health-related needs will improve Outcome: Progressing

## 2024-03-26 NOTE — Plan of Care (Signed)

## 2024-03-27 ENCOUNTER — Ambulatory Visit: Payer: Self-pay | Admitting: Internal Medicine

## 2024-03-27 DIAGNOSIS — D500 Iron deficiency anemia secondary to blood loss (chronic): Secondary | ICD-10-CM | POA: Diagnosis not present

## 2024-03-27 DIAGNOSIS — C180 Malignant neoplasm of cecum: Secondary | ICD-10-CM

## 2024-03-27 DIAGNOSIS — N390 Urinary tract infection, site not specified: Secondary | ICD-10-CM | POA: Diagnosis not present

## 2024-03-27 DIAGNOSIS — A419 Sepsis, unspecified organism: Secondary | ICD-10-CM | POA: Diagnosis not present

## 2024-03-27 LAB — CBC
HCT: 24.9 % — ABNORMAL LOW (ref 36.0–46.0)
Hemoglobin: 7.5 g/dL — ABNORMAL LOW (ref 12.0–15.0)
MCH: 24.5 pg — ABNORMAL LOW (ref 26.0–34.0)
MCHC: 30.1 g/dL (ref 30.0–36.0)
MCV: 81.4 fL (ref 80.0–100.0)
Platelets: 466 K/uL — ABNORMAL HIGH (ref 150–400)
RBC: 3.06 MIL/uL — ABNORMAL LOW (ref 3.87–5.11)
RDW: 16.5 % — ABNORMAL HIGH (ref 11.5–15.5)
WBC: 6.3 K/uL (ref 4.0–10.5)
nRBC: 0 % (ref 0.0–0.2)

## 2024-03-27 LAB — SURGICAL PATHOLOGY

## 2024-03-27 LAB — GLUCOSE, CAPILLARY
Glucose-Capillary: 256 mg/dL — ABNORMAL HIGH (ref 70–99)
Glucose-Capillary: 199 mg/dL — ABNORMAL HIGH (ref 70–99)
Glucose-Capillary: 181 mg/dL — ABNORMAL HIGH (ref 70–99)
Glucose-Capillary: 219 mg/dL — ABNORMAL HIGH (ref 70–99)

## 2024-03-27 MED ORDER — NEOMYCIN SULFATE 500 MG PO TABS
1000.0000 mg | ORAL_TABLET | ORAL | Status: AC
  Administered 2024-03-27 (×3): 1000 mg via ORAL
  Filled 2024-03-27 (×3): qty 2

## 2024-03-27 MED ORDER — METRONIDAZOLE 500 MG PO TABS
1000.0000 mg | ORAL_TABLET | ORAL | Status: AC
  Administered 2024-03-27 (×3): 1000 mg via ORAL
  Filled 2024-03-27 (×3): qty 2

## 2024-03-27 MED ORDER — POLYETHYLENE GLYCOL 3350 17 GM/SCOOP PO POWD
238.0000 g | Freq: Once | ORAL | Status: AC
  Administered 2024-03-27: 238 g via ORAL
  Filled 2024-03-27: qty 238

## 2024-03-27 MED ORDER — ONDANSETRON HCL 4 MG/2ML IJ SOLN
4.0000 mg | Freq: Four times a day (QID) | INTRAMUSCULAR | Status: DC | PRN
  Administered 2024-03-27 – 2024-03-28 (×2): 4 mg via INTRAVENOUS
  Filled 2024-03-27 (×2): qty 2

## 2024-03-27 MED ORDER — ALVIMOPAN 12 MG PO CAPS
12.0000 mg | ORAL_CAPSULE | ORAL | Status: AC
  Administered 2024-03-28: 12 mg via ORAL
  Filled 2024-03-27: qty 1

## 2024-03-27 MED ORDER — GABAPENTIN 100 MG PO CAPS
300.0000 mg | ORAL_CAPSULE | ORAL | Status: AC
  Administered 2024-03-28: 300 mg via ORAL
  Filled 2024-03-27: qty 3

## 2024-03-27 MED ORDER — IRON SUCROSE 200 MG IVPB - SIMPLE MED
200.0000 mg | Freq: Once | Status: AC
  Administered 2024-03-27: 200 mg via INTRAVENOUS
  Filled 2024-03-27: qty 200

## 2024-03-27 MED ORDER — SODIUM CHLORIDE 0.9 % IV SOLN
2.0000 g | INTRAVENOUS | Status: AC
  Administered 2024-03-28: 2 g via INTRAVENOUS
  Filled 2024-03-27: qty 2

## 2024-03-27 MED ORDER — ENSURE PRE-SURGERY PO LIQD
592.0000 mL | Freq: Once | ORAL | Status: AC
  Administered 2024-03-27: 592 mL via ORAL
  Filled 2024-03-27: qty 592

## 2024-03-27 MED ORDER — ACETAMINOPHEN 500 MG PO TABS
1000.0000 mg | ORAL_TABLET | ORAL | Status: AC
  Administered 2024-03-28: 1000 mg via ORAL
  Filled 2024-03-27: qty 2

## 2024-03-27 MED ORDER — ENSURE PRE-SURGERY PO LIQD
296.0000 mL | Freq: Once | ORAL | Status: DC
  Filled 2024-03-27: qty 296

## 2024-03-27 MED ORDER — SCOPOLAMINE 1 MG/3DAYS TD PT72
1.0000 | MEDICATED_PATCH | TRANSDERMAL | Status: DC
  Filled 2024-03-27: qty 1

## 2024-03-27 NOTE — Progress Notes (Signed)
 Mobility Specialist - Progress Note   03/27/24 1000  Mobility  Activity Ambulated with assistance  Level of Assistance Standby assist, set-up cues, supervision of patient - no hands on  Assistive Device None  Distance Ambulated (ft) 200 ft  Range of Motion/Exercises Active  Activity Response Tolerated well  Mobility Referral Yes  Mobility visit 1 Mobility  Mobility Specialist Start Time (ACUTE ONLY) 1024  Mobility Specialist Stop Time (ACUTE ONLY) 1034  Mobility Specialist Time Calculation (min) (ACUTE ONLY) 10 min   Received in bed and agreed to mobility, had c/o fatigue during session, returned to bed with all needs met, PA in room.  Cyndee Ada Mobility Specialist

## 2024-03-27 NOTE — Progress Notes (Signed)
 "   Progress Note   LOS: 9 days   Chief Complaint: Dark stool/anemia   Subjective   Patient is currently eating her breakfast without difficulty.  Had a bowel movement.  Pathology came back showing adenocarcinoma.  Surgery has been consulted.  No complaints today.    Objective   Vital signs in last 24 hours: Temp:  [97.9 F (36.6 C)-98.8 F (37.1 C)] 97.9 F (36.6 C) (01/13 0616) Pulse Rate:  [76-102] 102 (01/13 0616) Resp:  [17-18] 18 (01/13 0616) BP: (110-115)/(39-71) 113/71 (01/13 0616) SpO2:  [98 %-100 %] 100 % (01/13 0616) Last BM Date : 03/24/24 Last BM recorded by nurses in past 5 days Stool Type: Type 5 (Soft blobs with clear-cut edges) (03/23/2024  8:30 PM)  General:   female in no acute distress Heart:  Regular rate and rhythm; no murmurs Pulm: Clear anteriorly; no wheezing Abdomen: soft, nondistended, normal bowel sounds in all quadrants. Nontender without guarding. No organomegaly appreciated. Extremities:  No edema Neurologic:  Alert and  oriented x4;  No focal deficits.  Psych:  Cooperative. Normal mood and affect.  Intake/Output from previous day: 01/12 0701 - 01/13 0700 In: 840 [P.O.:840] Out: -  Intake/Output this shift: Total I/O In: 240 [P.O.:240] Out: -   Studies/Results: No results found.  Lab Results: Recent Labs    03/25/24 0503  WBC 9.7  HGB 7.5*  HCT 25.1*  PLT 444*   BMET Recent Labs    03/25/24 0503  NA 137  K 3.5  CL 105  CO2 21*  GLUCOSE 70  BUN 9  CREATININE 1.18*  CALCIUM  8.5*   LFT No results for input(s): PROT, ALBUMIN, AST, ALT, ALKPHOS, BILITOT, BILIDIR, IBILI in the last 72 hours. PT/INR No results for input(s): LABPROT, INR in the last 72 hours.   Scheduled Meds:  [START ON 03/28/2024] acetaminophen   1,000 mg Oral On Call to OR   [START ON 03/28/2024] alvimopan   12 mg Oral On Call to OR   [START ON 03/28/2024] feeding supplement  296 mL Oral Once   feeding supplement  592 mL Oral Once    [START ON 03/28/2024] gabapentin   300 mg Oral On Call to OR   glipiZIDE   10 mg Oral BID   insulin  aspart  0-15 Units Subcutaneous TID WC   insulin  aspart  0-5 Units Subcutaneous QHS   losartan   50 mg Oral Daily   metFORMIN   500 mg Oral BID WC   neomycin   1,000 mg Oral 3 times per day   And   metroNIDAZOLE   1,000 mg Oral 3 times per day   ondansetron  (ZOFRAN ) IV  2 mg Intravenous Once   pantoprazole   40 mg Oral Daily   polyethylene glycol powder  238 g Oral Once   pravastatin   40 mg Oral QHS   [START ON 03/28/2024] scopolamine   1 patch Transdermal On Call to OR   Continuous Infusions:  [START ON 03/28/2024] cefoTEtan  (CEFOTAN ) IV     iron  sucrose        Patient profile:   79 year old female admitted with AMS with sepsis secondary to UTI, E. 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  Hgb 7.5, stable.  No further bleeding EGD 1/8 with hiatal hernia, otherwise unremarkable  Colonoscopy 1/10 with ulcerative lesion at ileocecal valve Path: invasive colorectal adenocarcinoma, moderately differentiated.  CT chest abdomen pelvis 1/10 with right sided colitis, prominent ileocolic mesenteric nodes, no evidence of metastatic disease Hgb 7.5, stable (1/11) - CEA pending - Recheck CBC - Surgery planning for a right colectomy - On clear liquids per surgery - Will consult oncology as well  UTI Urosepsis S/p IV antibiotics now on Bactrim    Diabetes type 2   CKD   Heart murmur. ECHO 03/22/2024 showed LVEF 65 to 70%, normal RV function, trivial MV and TV regurgitation and AV sclerosis without AS.  No chest pain or shortness of breath.   Misty Cervantes  03/27/2024, 12:17 PM    "

## 2024-03-27 NOTE — Progress Notes (Signed)
 Occupational Therapy Treatment Patient Details Name: Misty Cervantes MRN: [id number] DOB: November 11, 1945 Today's Date: 03/27/2024   History of present illness Misty Cervantes is a 79 y.o. female who presented to the ED for evaluation of generalized weakness, nausea and confusion dizziness, fever, tachycardic, initiated sepsis protocol in ED. Dx- UTI/sepsis  Hospital course complicated by drop in hgb.  GI consulted and pt underwent colonoscopy on 1/10 showed potential tumor - awaiting biopsyPMH: r T2DM, HTN, HLD, osteoarthritis, CKD 3A, spinal stenosis and insomnia, THA, TKA   OT comments  Patient seen for skilled OT session. Focus on ECT's and 5 P's Handout integration with light BADL's and commode use. Progressed with light HEP. As per chart, patient for surgery tomorrow. Patient requires continued Acute care hospital level OT services to progress safety and functional performance and allow for discharge.        If plan is discharge home, recommend the following:  A little help with walking and/or transfers;A little help with bathing/dressing/bathroom;Assistance with cooking/housework;Assist for transportation;Help with stairs or ramp for entrance   Equipment Recommendations  None recommended by OT       Precautions / Restrictions Precautions Precautions: Fall Recall of Precautions/Restrictions: Intact Restrictions Weight Bearing Restrictions Per Provider Order: No       Mobility Bed Mobility Overal bed mobility: Needs Assistance Bed Mobility: Supine to Sit     Supine to sit: Supervision     General bed mobility comments: min cues especially with iron  iv running    Transfers Overall transfer level: Needs assistance Equipment used: None Transfers: Sit to/from Stand, Bed to chair/wheelchair/BSC Sit to Stand: Supervision     Step pivot transfers: Supervision     General transfer comment: supervision; performed toileting adls with supervision     Balance Overall balance  assessment: Needs assistance Sitting-balance support: Feet supported Sitting balance-Leahy Scale: Good     Standing balance support: No upper extremity supported Standing balance-Leahy Scale: Good                             ADL either performed or assessed with clinical judgement   ADL Overall ADL's : Needs assistance/impaired     Grooming: Wash/dry hands;Wash/dry face;Modified independent;Sitting                   Toilet Transfer: Supervision/safety;BSC/3in1   Psychiatrist and Hygiene: Set up;Sitting/lateral lean         General ADL Comments: patient recieving iron  via IV, thus limited visit to [facility] and EOB level, reinforced ECT's and pacing with + teach back    Extremity/Trunk Assessment Upper Extremity Assessment Upper Extremity Assessment: Generalized weakness;Right hand dominant   Lower Extremity Assessment Lower Extremity Assessment: Defer to PT evaluation        Vision   Vision Assessment?: No apparent visual deficits;Wears glasses for reading         Communication Communication Communication: No apparent difficulties   Cognition Arousal: Alert Behavior During Therapy: WFL for tasks assessed/performed Cognition: No apparent impairments                               Following commands: Intact        Cueing   Cueing Techniques: Verbal cues, Tactile cues  Exercises Exercises: Other exercises (Foam grasp ball 20 reps Bly, seated push ups with STS)    Shoulder Instructions  General Comments SpO2 98% on RA, limited activity tolerance due to need for colon prep intake for sched surgery for tomorrow    Pertinent Vitals/ Pain       Pain Assessment Pain Assessment: No/denies pain   Frequency  Min 1X/week        Progress Toward Goals  OT Goals(current goals can now be found in the care plan section)  Progress towards OT goals: Progressing toward goals  Acute Rehab OT Goals Patient  Stated Goal: to feel better after surgery OT Goal Formulation: With patient/family Time For Goal Achievement: 04/03/24 Potential to Achieve Goals: Good  Plan         AM-PAC OT 6 Clicks Daily Activity     Outcome Measure   Help from another person eating meals?: None Help from another person taking care of personal grooming?: A Little Help from another person toileting, which includes using toliet, bedpan, or urinal?: A Little Help from another person bathing (including washing, rinsing, drying)?: A Little Help from another person to put on and taking off regular upper body clothing?: A Little Help from another person to put on and taking off regular lower body clothing?: A Little 6 Click Score: 19    End of Session Equipment Utilized During Treatment: Gait belt;Rolling walker (2 wheels)  OT Visit Diagnosis: Unsteadiness on feet (R26.81)   Activity Tolerance Patient tolerated treatment well   Patient Left in bed;with bed alarm set;with call bell/phone within reach;with family/visitor present   Nurse Communication Mobility status        Time: 1435-1450 OT Time Calculation (min): 15 min  Charges: OT General Charges $OT Visit: 1 Visit OT Treatments $Therapeutic Activity: 8-22 mins  Kamorah Nevils OT/L Acute Rehabilitation Department  [phone]  03/27/2024, 5:28 PM

## 2024-03-27 NOTE — Plan of Care (Signed)
   Problem: Education: Goal: Knowledge of General Education information will improve Description Including pain rating scale, medication(s)/side effects and non-pharmacologic comfort measures Outcome: Progressing   Problem: Health Behavior/Discharge Planning: Goal: Ability to manage health-related needs will improve Outcome: Progressing

## 2024-03-27 NOTE — Progress Notes (Signed)
" ° ° °  PROCEDURAL EXPEDITER PROGRESS NOTE  Patient Name: Misty Cervantes  DOB:1945/08/28 Date of Admission: 03/18/2024  Date of Assessment:03/27/2024   -------------------------------------------------------------------------------------------------------------------   Brief clinical summary: 79 yr old female having surgery on 03/29/2023 pt having laparoscopic partial colectomy.   Orders in place:  No   Communication with surgical team if no orders: will reach out to surgeon by secure chat.   Labs, test, and orders reviewed: yes  Requires surgical clearance:  No  What type of clearance: n/a  Clearance received: n/a  Barriers noted:no  pre op orders or consent   Intervention provided by [facility] team: scure sent to Dr. Sheldon for orders  Barrier resolved:  no   -------------------------------------------------------------------------------------------------------------------  [facility] Expediter, Misty Cervantes Please contact us  directly via secure chat (search for [facility]) or by calling us  at [phone] [facility]).  "

## 2024-03-27 NOTE — Consult Note (Addendum)
 "    Misty Cervantes 05-27-1945  [id number].    Requesting MD: Dr. Chapman Rota Chief Complaint/Reason for Consult: Colon Mass  HPI: Misty Cervantes is a 79 y.o. female with a hx of DM2, HTN, HLD, CKD3A, who presented to the ED w/ generalized weakness and AMS. She was found to be febrile, tachycardic and anemic. She was admitted and tx for E Coli bacteremia and  UTI. She required transfusion 1U PRBC 1/5. Developed melena. FOBT positive. GI consulted for anemia. EGD with HH but no source of GI bleed found. Underwent Colonoscopy 1/10 with ulcerated mass, measuring 3cm, at the ileocecal valve. Path with invasive colorectal adenocarcinoma, moderately differentiated. Hgb stable at 7.5 on last check 1/11. CT CAP w/ no well defined mass on CT but right sided colitis changes, ileocolic mesenteric nodes but otherwise without evidence of metastatic disease. No prior abdominal surgeries. Reports family hx of kidney cancer in her father. No other personal or family hx of cancer before this. Last colonoscopy before this admission was in 2013 that showed a hyperplastic rectal polyp but was otherwise normal. She is not on any blood thinners aside from 81mg  ASA. Last BM was during her prep for colonoscopy. She continues to pass flatus. She is currently on a CM diet and tolerating without abdominal pain, n/v. General surgery was asked to see.   ROS: ROS As above, see hpi  Family History  Problem Relation Age of Onset   Diabetes Mother        deceased   Heart attack Mother    Diabetes Sister        x2   Kidney cancer Father    Heart attack Father    Diabetes Sister     Past Medical History:  Diagnosis Date   Abdominal pain 08/23/2021   Chronic kidney disease (CKD), stage III (moderate) (HCC)    followed by pcp   Dysphagia 01/22/2021   Full dentures    HCAP (healthcare-associated pneumonia) 08/22/2021   Headache 08/23/2021   History of 2019 novel coronavirus disease (COVID-19) 04/05/2019   result  in care everywhere ,  per pt mild symptoms that resolved   History of gastric ulcer 2013   non-bleeding   Hyperlipemia    Hypokalemia    Hyponatremia 08/23/2021   Low serum potassium level 01/29/2021   Mixed stress and urge incontinence    Osteoarthritis    Presence of pessary    Respiratory distress 11/28/2021   Sepsis (HCC) 11/28/2021   Type 2 diabetes mellitus (HCC)    followed by pcp   (08-26-2020 per pt checks blood sugar twice weekly, unsure what fasting sugar is)   UTI (urinary tract infection) 08/23/2021   Vaginal vault 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    gyn--- dr jannis, using pessary   Wears glasses     Past Surgical History:  Procedure Laterality Date   BIOPSY OF SKIN SUBCUTANEOUS TISSUE AND/OR MUCOUS MEMBRANE  03/24/2024   Procedure: BIOPSY, SKIN, SUBCUTANEOUS TISSUE, OR MUCOUS MEMBRANE;  Surgeon: Abran Norleen SAILOR, MD;  Location: [facility];  Service: Gastroenterology;;   ORIN MEDIATE RELEASE Right    1980s   CATARACT EXTRACTION W/ INTRAOCULAR LENS IMPLANT Bilateral 2013   approx   COLONOSCOPY N/A 03/24/2024   Procedure: COLONOSCOPY;  Surgeon: Abran Norleen SAILOR, MD;  Location: [facility];  Service: Gastroenterology;  Laterality: N/A;   COLONOSCOPY WITH ESOPHAGOGASTRODUODENOSCOPY (EGD)  11/17/2011   COLPOCLEISIS N/A 09/02/2020   Procedure: COLPOCLEISIS, PERINEORRHAPHY;  Surgeon: Jannis Kate Norris, MD;  Location: [facility];  Service: Gynecology;  Laterality: N/A;   CYSTOSCOPY N/A 09/02/2020   Procedure: CYSTOSCOPY;  Surgeon: Jannis Kate Norris, MD;  Location: [facility];  Service: Gynecology;  Laterality: N/A;   ESOPHAGOGASTRODUODENOSCOPY N/A 03/22/2024   Procedure: EGD (ESOPHAGOGASTRODUODENOSCOPY);  Surgeon: Abran Norleen SAILOR, MD;  Location: [facility];  Service: Gastroenterology;  Laterality: N/A;   RETINAL DETACHMENT SURGERY Right 2003   TOTAL HIP ARTHROPLASTY Left 03/02/2010   @[facility]    TOTAL KNEE ARTHROPLASTY Right 11/03/2015   Procedure:  RIGHT TOTAL KNEE ARTHROPLASTY;  Surgeon: Dempsey Moan, MD;  Location: [facility];  Service: Orthopedics;  Laterality: Right;   TOTAL VAGINAL HYSTERECTOMY  2012   TVH, anterior repair, TVT (no mesh used for the anterior repair)    Social History:  reports that she has never smoked. She has never used smokeless tobacco. She reports that she does not drink alcohol and does not use drugs.  Allergies: Allergies[1]  Medications Prior to Admission  Medication Sig Dispense Refill   aspirin  EC 81 MG tablet Take 81 mg by mouth daily. Swallow whole.     cetirizine (ZYRTEC) 10 MG tablet Take 10 mg by mouth daily.     cyanocobalamin  (VITAMIN B12) 1000 MCG tablet Take 1,000 mcg by mouth daily.     dimenhyDRINATE (DRAMAMINE) 50 MG tablet Take 50 mg by mouth daily as needed for nausea or dizziness.     empagliflozin  (JARDIANCE ) 25 MG TABS tablet Take 25 mg by mouth daily.     fluticasone  (FLONASE ) 50 MCG/ACT nasal spray SPRAY 1 SPRAY INTO EACH NOSTRIL TWICE A DAY AS NEEDED FOR ALLERGIES OR RHINITIS (Patient taking differently: Place 1 spray into both nostrils daily as needed for allergies.) 48 mL 0   glipiZIDE  (GLUCOTROL  XL) 10 MG 24 hr tablet Take 1 tablet (10 mg total) by mouth 2 (two) times daily after a meal. 180 tablet 1   losartan  (COZAAR ) 50 MG tablet Take 1 tablet (50 mg total) by mouth daily. 90 tablet 1   metFORMIN  (GLUCOPHAGE -XR) 500 MG 24 hr tablet TAKE 1 TABLET BY MOUTH EVERY DAY WITH BREAKFAST. 180 tablet 1   Multiple Vitamin (MULTIVITAMIN WITH MINERALS) TABS tablet Take 2 tablets by mouth daily.     pravastatin  (PRAVACHOL ) 40 MG tablet TAKE 1 TABLET BY MOUTH EVERYDAY AT BEDTIME 90 tablet 1   VITAMIN D PO Take 2 capsules by mouth daily.     ACCU-CHEK GUIDE test strip USE 1 STRIP TWICE A DAY 100 strip 2   Accu-Chek Softclix Lancets lancets CHECK BLOOD SUGARS 1-2 TIMES PER DAY. 100 each 2   empagliflozin  (JARDIANCE ) 10 MG TABS tablet Take 1 tablet (10 mg total) by mouth daily before breakfast.  (Patient not taking: Reported on 03/19/2024) 90 tablet 1   traZODone  (DESYREL ) 50 MG tablet Take 0.5-1 tablets (25-50 mg total) by mouth at bedtime as needed. for sleep (Patient not taking: Reported on 03/19/2024) 90 tablet 1     Physical Exam: Blood pressure 113/71, pulse (!) 102, temperature 97.9 F (36.6 C), resp. rate 18, height 4' 11 (1.499 m), weight 66.2 kg, SpO2 100%. General: pleasant, WD/WN female who is laying in bed in NAD HEENT: head is normocephalic, atraumatic.  Sclera are non-icteric.  Heart: regular, rate, and rhythm.   Lungs: Respiratory effort nonlabored Abd: Soft, mild distension, NT, +BS. No masses, hernias, or organomegaly MS: no BUE or BLE edema Skin: warm and dry  Psych: A&Ox4 with an appropriate affect Neuro: normal speech, thought  process intact, moves all extremities, gait not assessed  Results for orders placed or performed during the hospital encounter of 03/18/24 (from the past 48 hours)  Glucose, capillary     Status: Abnormal   Collection Time: 03/25/24 12:00 PM  Result Value Ref Range   Glucose-Capillary 141 (H) 70 - 99 mg/dL    Comment: Glucose reference range applies only to samples taken after fasting for at least 8 hours.  Glucose, capillary     Status: Abnormal   Collection Time: 03/25/24  5:17 PM  Result Value Ref Range   Glucose-Capillary 205 (H) 70 - 99 mg/dL    Comment: Glucose reference range applies only to samples taken after fasting for at least 8 hours.  Glucose, capillary     Status: Abnormal   Collection Time: 03/25/24  9:49 PM  Result Value Ref Range   Glucose-Capillary 216 (H) 70 - 99 mg/dL    Comment: Glucose reference range applies only to samples taken after fasting for at least 8 hours.  Glucose, capillary     Status: Abnormal   Collection Time: 03/26/24  7:25 AM  Result Value Ref Range   Glucose-Capillary 174 (H) 70 - 99 mg/dL    Comment: Glucose reference range applies only to samples taken after fasting for at least 8 hours.   Glucose, capillary     Status: Abnormal   Collection Time: 03/26/24 11:27 AM  Result Value Ref Range   Glucose-Capillary 226 (H) 70 - 99 mg/dL    Comment: Glucose reference range applies only to samples taken after fasting for at least 8 hours.  Glucose, capillary     Status: Abnormal   Collection Time: 03/26/24  4:20 PM  Result Value Ref Range   Glucose-Capillary 278 (H) 70 - 99 mg/dL    Comment: Glucose reference range applies only to samples taken after fasting for at least 8 hours.  Glucose, capillary     Status: Abnormal   Collection Time: 03/26/24  8:55 PM  Result Value Ref Range   Glucose-Capillary 185 (H) 70 - 99 mg/dL    Comment: Glucose reference range applies only to samples taken after fasting for at least 8 hours.  Glucose, capillary     Status: Abnormal   Collection Time: 03/27/24  7:32 AM  Result Value Ref Range   Glucose-Capillary 256 (H) 70 - 99 mg/dL    Comment: Glucose reference range applies only to samples taken after fasting for at least 8 hours.   No results found.  Anti-infectives (From admission, onward)    Start     Dose/Rate Route Frequency Ordered Stop   03/21/24 2200  sulfamethoxazole -trimethoprim  (BACTRIM  DS) 800-160 MG per tablet 2 tablet        2 tablet Oral Every 12 hours 03/21/24 1448 03/24/24 2121   03/20/24 0600  cefTRIAXone  (ROCEPHIN ) 2 g in sodium chloride  0.9 % 100 mL IVPB  Status:  Discontinued        2 g 200 mL/hr over 30 Minutes Intravenous Every 24 hours 03/19/24 0717 03/21/24 1448   03/19/24 0815  cefTRIAXone  (ROCEPHIN ) 1 g in sodium chloride  0.9 % 100 mL IVPB        1 g 200 mL/hr over 30 Minutes Intravenous  Once 03/19/24 0717 03/19/24 0930   03/19/24 0500  cefTRIAXone  (ROCEPHIN ) 1 g in sodium chloride  0.9 % 100 mL IVPB  Status:  Discontinued        1 g 200 mL/hr over 30 Minutes Intravenous Every 24 hours 03/18/24  2117 03/19/24 0717   03/18/24 1715  vancomycin  (VANCOREADY) IVPB 1500 mg/300 mL        1,500 mg 150 mL/hr over 120  Minutes Intravenous  Once 03/18/24 1642 03/18/24 2011   03/18/24 1645  ceFEPIme  (MAXIPIME ) 2 g in sodium chloride  0.9 % 100 mL IVPB        2 g 200 mL/hr over 30 Minutes Intravenous  Once 03/18/24 1640 03/18/24 1800   03/18/24 1645  metroNIDAZOLE  (FLAGYL ) IVPB 500 mg        500 mg 100 mL/hr over 60 Minutes Intravenous  Once 03/18/24 1640 03/18/24 1831   03/18/24 1645  vancomycin  (VANCOCIN ) IVPB 1000 mg/200 mL premix  Status:  Discontinued        1,000 mg 200 mL/hr over 60 Minutes Intravenous  Once 03/18/24 1640 03/18/24 1642       Assessment/Plan Ascending/ICV Colon Mass - Colonoscopy 1/10 with ulcerated mass, measuring ~3cm, at the ileocecal valve. Path with invasive colorectal adenocarcinoma, moderately differentiated.  - Hgb stable at 7.5 on last check 1/11 - CT CAP w/ no well defined mass on CT but right sided colitis changes, ileocolic mesenteric nodes but otherwise without evidence of metastatic disease.  - CEA ordered - Recommend OR for R colectomy.  The planned procedure and material risks were discussed with the patient. Risks include but are not limited to anesthesia (MI, CVA, prolonged intubation, aspiration, death), pain, bleeding,  infection, scarring, hernia, damage to surrounding structures (blood vessels/nerves/viscus/organs/ureter), ileus, leak from anastomosis, DVT/PE and possible need for stoma/ileostomy. We also discussed typical post-operative care including the possible need for rehab/snf if necessary. The patient's questions were answered to their satisfaction, they voiced understanding and elected to proceed with surgery.  - Will need to back patient back down to CLD and repeat prep before OR. Please do not advance past CLD - We will follow with you  FEN - CLD, NPO 430am per protocol for possible OR tomorrow. IVF per TRH VTE - SCDs, on hold for anemia ID - None currently Foley - None currently  DM2 HTN HLD CKD3A E Coli bacteremia  E. Coli UTI Hiatal Hernia    I reviewed nursing notes, Consultant (GI) notes, hospitalist notes, last 24 h vitals and pain scores, last 48 h intake and output, last 24 h labs and trends, and last 24 h imaging results.   Misty Cervantes, [facility] 03/27/2024, 10:20 AM Please see Amion for pager number during day hours 7:00am-4:30pm     [1]  Allergies Allergen Reactions   Bacid Other (See Comments)    Unknown    Latex Other (See Comments)    Exact reaction not cited   Norvasc  [Amlodipine ] Swelling   "

## 2024-03-27 NOTE — Anesthesia Preprocedure Evaluation (Signed)
"                                    Anesthesia Evaluation  Patient identified by MRN, date of birth, ID band Patient awake    Reviewed: Allergy & Precautions, NPO status , Patient's Chart, lab work & pertinent test results  Airway Mallampati: I  TM Distance: >3 FB Neck ROM: Full    Dental  (+) Edentulous Upper, Edentulous Lower   Pulmonary     + decreased breath sounds      Cardiovascular hypertension, Pt. on medications  Rhythm:Regular Rate:Normal     Neuro/Psych    GI/Hepatic Neg liver ROS, PUD,GERD  Medicated,,  Endo/Other  diabetes, Type 2, Oral Hypoglycemic Agents    Renal/GU Renal disease     Musculoskeletal  (+) Arthritis ,    Abdominal   Peds  Hematology  (+) Blood dyscrasia, anemia   Anesthesia Other Findings   Reproductive/Obstetrics                              Anesthesia Physical Anesthesia Plan  ASA: 3  Anesthesia Plan: General   Post-op Pain Management: Tylenol  PO (pre-op)*   Induction: Intravenous  PONV Risk Score and Plan: 4 or greater and Ondansetron  and Treatment may vary due to age or medical condition  Airway Management Planned: Oral ETT  Additional Equipment: None  Intra-op Plan:   Post-operative Plan: Extubation in OR  Informed Consent: I have reviewed the patients History and Physical, chart, labs and discussed the procedure including the risks, benefits and alternatives for the proposed anesthesia with the patient or authorized representative who has indicated his/her understanding and acceptance.     Dental advisory given  Plan Discussed with: CRNA  Anesthesia Plan Comments:          Anesthesia Quick Evaluation  "

## 2024-03-27 NOTE — Progress Notes (Signed)
 " PROGRESS NOTE  Misty Cervantes Meissner  DOB: 1945-04-14  PCP: Lucius Krabbe, NP FMW:[id number]  DOA: 03/18/2024  LOS: 9 days  Hospital Day: 10  Subjective: Patient was seen and examined this morning. Sitting up at the edge of the bed.  Not in distress.  Taking her breakfast.  Family not at bedside. Afebrile, hemodynamically stable Blood sugar level has been consistently over 200 Surgical pathology report is back today showing invasive colorectal adenocarcinoma. I updated patient as well as her granddaughter on the phone.  Brief narrative: Misty Cervantes is a 79 y.o. female with PMH significant for T2DM, HTN, HLD, CKD 3a, dysphagia, osteoarthritis, h/o gastric ulcers 1/4, patient presented to ED with complaint of weakness, nausea, and confusion.   Was noted to have fever to 102.6 with tachycardia,  Urinalysis showed hazy straw-colored urine with moderate leukocytes, many bacteria Started on Vancomycin /Cefepime /Metronidazole .  Also Hgb was low at 7.6 on arrival Admitted to [facility] Blood culture and urine culture sent on admission grew E. Coli  Her hospital course was complicated by acute drop in hemoglobin.  GI was consulted. 1/10, underwent colonoscopy which showed potential malignant tumor at the ileocecal valve.  Biopsy resulted to show invasive colorectal adenocarcinoma See below for details  Assessment and plan: Invasive colorectal adenocarcinoma 1/10, underwent colonoscopy for evaluation of anemia.  It showed potential malignant tumor at the ileocecal valve.  Biopsy sent. CT scan did not show any cecal mass, only suggested right-sided colitis infectious versus inflammatory. 1/13, biopsy resulted showing invasive colorectal adenocarcinoma.  I have called surgery consultation  Sepsis secondary to UTI  E coli bacteremia and UTI Primarily admitted for sepsis secondary to UTI Blood culture as well as urine culture grew E. Coli. Repeat blood culture sent on 1/6 has not shown any  growth so far. Initially improved with IV Rocephin  and later transitioned to oral Bactrim .  Completed 7-day course of antibiotics on 1/10. 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 Hemoglobin at the lowest of 6.3 on 1/5.  Anemia panel showed low ferritin and low iron  level.  She received 1 unit of blood on 1/5 Hemoglobin has been stable since then 1/10, colonoscopy was done and ultimately diagnosed to have colorectal cancer as discussed above.  Recent Labs    03/19/24 0431 03/19/24 1631 03/21/24 0441 03/22/24 0513 03/23/24 0519 03/24/24 0520 03/25/24 0503  HGB  --    < > 7.9* 8.2* 7.4* 7.5* 7.5*  MCV  --    < > 81.4 79.5* 80.3 79.6* 80.2  VITAMINB12 2,598*  --   --   --   --   --   --   FERRITIN 52  --   --   --   --   --   --   TIBC 357  --   --   --   --   --   --   IRON  <10*  --   --   --   --   --   --    < > = values in this interval not displayed.   Acute multiple encephalopathy  MCI (mild cognitive impairment) Was confused at presentation likely due to UTI.  Family also has concerns about developing dementia at home. Had episodes of sundowning in the hospital. Mental status gradually improved to normal.  Now able to  have meaningful conversation. Continue delirium precautions Outpatient neurology follow-up.  Type 2 diabetes mellitus uncontrolled with hyperglycemia A1c 10.7 on 02/28/2024 PTA meds-glipizide , metformin , Jardiance  Diabetes care coordinator consult appreciated Currently on glipizide  10 mg twice daily.  Blood sugar level not adequately controlled. Metformin  was held because of use of contrast for CT. It has been resumed this morning Continue SSI with Accu-Cheks. Recent Labs  Lab 03/26/24 0725 03/26/24 1127 03/26/24 1620 03/26/24 2055 03/27/24 0732  GLUCAP 174* 226* 278*  185* 256*   HTN Blood pressure currently controlled on losartan  50 mg daily.  HLD Continue simvastatin  CKD 3A Creatinine stable at baseline Recent Labs  Lab 03/21/24 0441 03/22/24 0513 03/23/24 0519 03/24/24 0520 03/25/24 0503  NA 134* 134* 133* 136 137  K 4.0 3.7 4.3 3.6 3.5  CL 101 101 101 103 105  CO2 20* 21* 20* 20* 21*  GLUCOSE 192* 243* 225* 212* 70  BUN 20 16 18 14 9   CREATININE 0.99 0.90 1.17* 1.15* 1.18*  CALCIUM  8.6* 8.7* 8.5* 8.4* 8.5*   Generalized weakness In the setting of acute illness and dehydration PT/OT consulted.  Home health recommended. Fall precautions  Systolic ejection murmur Likely age-related aortic sclerosis, confirmed with echocardiogram 1/8. No need of further workup   Nutrition Status:         Mobility:   PT Orders: Active   PT Follow up Rec: Home Health Pt1/02/2025 1153    Goals of care   Code Status: Full Code     DVT prophylaxis:  Place and maintain sequential compression device Start: 03/19/24 1332   Antimicrobials: Completed the course of Bactrim  Fluid: None Consultants: GI Family Communication: Granddaughter on the phone  Status: Inpatient Level of care:  Med-Surg   Patient is from: Home Needs to continue in-hospital care: Pending general surgery consultation for invasive colorectal cancer  Anticipated d/c to: Ongoing workup.  Ultimately home with home health PT.     Diet:  Diet Order             Diet Carb Modified Room service appropriate? Yes  Diet effective now                   Scheduled Meds:  glipiZIDE   10 mg Oral BID   insulin  aspart  0-15 Units Subcutaneous TID WC   insulin  aspart  0-5 Units Subcutaneous QHS   losartan   50 mg Oral Daily   metFORMIN   500 mg Oral BID WC   ondansetron  (ZOFRAN ) IV  2 mg Intravenous Once   pantoprazole   40 mg Oral Daily   pravastatin   40 mg Oral QHS    PRN meds: acetaminophen  **OR** acetaminophen , bisacodyl , hydrALAZINE , hydrOXYzine ,  senna-docusate   Infusions:      Antimicrobials: Anti-infectives (From admission, onward)    Start     Dose/Rate Route Frequency Ordered Stop   03/21/24 2200  sulfamethoxazole -trimethoprim  (BACTRIM  DS) 800-160 MG per tablet 2 tablet        2 tablet Oral Every 12 hours 03/21/24 1448 03/24/24 2121   03/20/24 0600  cefTRIAXone  (ROCEPHIN ) 2 g in sodium chloride  0.9 % 100 mL IVPB  Status:  Discontinued        2 g 200 mL/hr over 30 Minutes Intravenous Every 24 hours 03/19/24 0717 03/21/24 1448   03/19/24 0815  cefTRIAXone  (ROCEPHIN ) 1 g in sodium chloride  0.9 % 100 mL IVPB        1 g 200 mL/hr over 30 Minutes Intravenous  Once 03/19/24 0717 03/19/24  0930   03/19/24 0500  cefTRIAXone  (ROCEPHIN ) 1 g in sodium chloride  0.9 % 100 mL IVPB  Status:  Discontinued        1 g 200 mL/hr over 30 Minutes Intravenous Every 24 hours 03/18/24 2117 03/19/24 0717   03/18/24 1715  vancomycin  (VANCOREADY) IVPB 1500 mg/300 mL        1,500 mg 150 mL/hr over 120 Minutes Intravenous  Once 03/18/24 1642 03/18/24 2011   03/18/24 1645  ceFEPIme  (MAXIPIME ) 2 g in sodium chloride  0.9 % 100 mL IVPB        2 g 200 mL/hr over 30 Minutes Intravenous  Once 03/18/24 1640 03/18/24 1800   03/18/24 1645  metroNIDAZOLE  (FLAGYL ) IVPB 500 mg        500 mg 100 mL/hr over 60 Minutes Intravenous  Once 03/18/24 1640 03/18/24 1831   03/18/24 1645  vancomycin  (VANCOCIN ) IVPB 1000 mg/200 mL premix  Status:  Discontinued        1,000 mg 200 mL/hr over 60 Minutes Intravenous  Once 03/18/24 1640 03/18/24 1642       Objective: Vitals:   03/26/24 2047 03/27/24 0616  BP: 110/62 113/71  Pulse: 76 (!) 102  Resp: 17 18  Temp: 98.8 F (37.1 C) 97.9 F (36.6 C)  SpO2: 99% 100%    Intake/Output Summary (Last 24 hours) at 03/27/2024 1054 Last data filed at 03/27/2024 1000 Gross per 24 hour  Intake 840 ml  Output --  Net 840 ml   Filed Weights   03/18/24 1615  Weight: 66.2 kg   Weight change:  Body mass index is 29.49  kg/m.   Physical Exam: General exam: Pleasant, elderly Caucasian female.  Not in distress. Skin: No rashes, lesions or ulcers. HEENT: Atraumatic, normocephalic, no obvious bleeding Lungs: Clear to auscultation bilaterally,  CVS: S1, S2, systolic ejection murmur GI/Abd: Soft, nontender, nondistended, bowel sound present,   CNS: Alert, awake, ordered x 3 Psychiatry: Understandably anxious about the new diagnosis of cancer Extremities: No pedal edema, no calf tenderness,   Data Review: I have personally reviewed the laboratory data and studies available.  F/u labs ordered Unresulted Labs (From admission, onward)     Start     Ordered   Pending  CEA  Once,   R        Pending            Signed, Chapman Rota, MD [facility] 03/27/2024  "

## 2024-03-27 NOTE — Plan of Care (Signed)

## 2024-03-27 NOTE — H&P (View-Only) (Signed)
 "    Misty Cervantes 05-27-1945  [id number].    Requesting MD: Dr. Chapman Rota Chief Complaint/Reason for Consult: Colon Mass  HPI: Misty Cervantes is a 79 y.o. female with a hx of DM2, HTN, HLD, CKD3A, who presented to the ED w/ generalized weakness and AMS. She was found to be febrile, tachycardic and anemic. She was admitted and tx for E Coli bacteremia and  UTI. She required transfusion 1U PRBC 1/5. Developed melena. FOBT positive. GI consulted for anemia. EGD with HH but no source of GI bleed found. Underwent Colonoscopy 1/10 with ulcerated mass, measuring 3cm, at the ileocecal valve. Path with invasive colorectal adenocarcinoma, moderately differentiated. Hgb stable at 7.5 on last check 1/11. CT CAP w/ no well defined mass on CT but right sided colitis changes, ileocolic mesenteric nodes but otherwise without evidence of metastatic disease. No prior abdominal surgeries. Reports family hx of kidney cancer in her father. No other personal or family hx of cancer before this. Last colonoscopy before this admission was in 2013 that showed a hyperplastic rectal polyp but was otherwise normal. She is not on any blood thinners aside from 81mg  ASA. Last BM was during her prep for colonoscopy. She continues to pass flatus. She is currently on a CM diet and tolerating without abdominal pain, n/v. General surgery was asked to see.   ROS: ROS As above, see hpi  Family History  Problem Relation Age of Onset   Diabetes Mother        deceased   Heart attack Mother    Diabetes Sister        x2   Kidney cancer Father    Heart attack Father    Diabetes Sister     Past Medical History:  Diagnosis Date   Abdominal pain 08/23/2021   Chronic kidney disease (CKD), stage III (moderate) (HCC)    followed by pcp   Dysphagia 01/22/2021   Full dentures    HCAP (healthcare-associated pneumonia) 08/22/2021   Headache 08/23/2021   History of 2019 novel coronavirus disease (COVID-19) 04/05/2019   result  in care everywhere ,  per pt mild symptoms that resolved   History of gastric ulcer 2013   non-bleeding   Hyperlipemia    Hypokalemia    Hyponatremia 08/23/2021   Low serum potassium level 01/29/2021   Mixed stress and urge incontinence    Osteoarthritis    Presence of pessary    Respiratory distress 11/28/2021   Sepsis (HCC) 11/28/2021   Type 2 diabetes mellitus (HCC)    followed by pcp   (08-26-2020 per pt checks blood sugar twice weekly, unsure what fasting sugar is)   UTI (urinary tract infection) 08/23/2021   Vaginal vault 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    gyn--- dr jannis, using pessary   Wears glasses     Past Surgical History:  Procedure Laterality Date   BIOPSY OF SKIN SUBCUTANEOUS TISSUE AND/OR MUCOUS MEMBRANE  03/24/2024   Procedure: BIOPSY, SKIN, SUBCUTANEOUS TISSUE, OR MUCOUS MEMBRANE;  Surgeon: Abran Norleen SAILOR, MD;  Location: [facility];  Service: Gastroenterology;;   ORIN MEDIATE RELEASE Right    1980s   CATARACT EXTRACTION W/ INTRAOCULAR LENS IMPLANT Bilateral 2013   approx   COLONOSCOPY N/A 03/24/2024   Procedure: COLONOSCOPY;  Surgeon: Abran Norleen SAILOR, MD;  Location: [facility];  Service: Gastroenterology;  Laterality: N/A;   COLONOSCOPY WITH ESOPHAGOGASTRODUODENOSCOPY (EGD)  11/17/2011   COLPOCLEISIS N/A 09/02/2020   Procedure: COLPOCLEISIS, PERINEORRHAPHY;  Surgeon: Jannis Kate Norris, MD;  Location: [facility];  Service: Gynecology;  Laterality: N/A;   CYSTOSCOPY N/A 09/02/2020   Procedure: CYSTOSCOPY;  Surgeon: Jannis Kate Norris, MD;  Location: [facility];  Service: Gynecology;  Laterality: N/A;   ESOPHAGOGASTRODUODENOSCOPY N/A 03/22/2024   Procedure: EGD (ESOPHAGOGASTRODUODENOSCOPY);  Surgeon: Abran Norleen SAILOR, MD;  Location: [facility];  Service: Gastroenterology;  Laterality: N/A;   RETINAL DETACHMENT SURGERY Right 2003   TOTAL HIP ARTHROPLASTY Left 03/02/2010   @[facility]    TOTAL KNEE ARTHROPLASTY Right 11/03/2015   Procedure:  RIGHT TOTAL KNEE ARTHROPLASTY;  Surgeon: Dempsey Moan, MD;  Location: [facility];  Service: Orthopedics;  Laterality: Right;   TOTAL VAGINAL HYSTERECTOMY  2012   TVH, anterior repair, TVT (no mesh used for the anterior repair)    Social History:  reports that she has never smoked. She has never used smokeless tobacco. She reports that she does not drink alcohol and does not use drugs.  Allergies: Allergies[1]  Medications Prior to Admission  Medication Sig Dispense Refill   aspirin  EC 81 MG tablet Take 81 mg by mouth daily. Swallow whole.     cetirizine (ZYRTEC) 10 MG tablet Take 10 mg by mouth daily.     cyanocobalamin  (VITAMIN B12) 1000 MCG tablet Take 1,000 mcg by mouth daily.     dimenhyDRINATE (DRAMAMINE) 50 MG tablet Take 50 mg by mouth daily as needed for nausea or dizziness.     empagliflozin  (JARDIANCE ) 25 MG TABS tablet Take 25 mg by mouth daily.     fluticasone  (FLONASE ) 50 MCG/ACT nasal spray SPRAY 1 SPRAY INTO EACH NOSTRIL TWICE A DAY AS NEEDED FOR ALLERGIES OR RHINITIS (Patient taking differently: Place 1 spray into both nostrils daily as needed for allergies.) 48 mL 0   glipiZIDE  (GLUCOTROL  XL) 10 MG 24 hr tablet Take 1 tablet (10 mg total) by mouth 2 (two) times daily after a meal. 180 tablet 1   losartan  (COZAAR ) 50 MG tablet Take 1 tablet (50 mg total) by mouth daily. 90 tablet 1   metFORMIN  (GLUCOPHAGE -XR) 500 MG 24 hr tablet TAKE 1 TABLET BY MOUTH EVERY DAY WITH BREAKFAST. 180 tablet 1   Multiple Vitamin (MULTIVITAMIN WITH MINERALS) TABS tablet Take 2 tablets by mouth daily.     pravastatin  (PRAVACHOL ) 40 MG tablet TAKE 1 TABLET BY MOUTH EVERYDAY AT BEDTIME 90 tablet 1   VITAMIN D PO Take 2 capsules by mouth daily.     ACCU-CHEK GUIDE test strip USE 1 STRIP TWICE A DAY 100 strip 2   Accu-Chek Softclix Lancets lancets CHECK BLOOD SUGARS 1-2 TIMES PER DAY. 100 each 2   empagliflozin  (JARDIANCE ) 10 MG TABS tablet Take 1 tablet (10 mg total) by mouth daily before breakfast.  (Patient not taking: Reported on 03/19/2024) 90 tablet 1   traZODone  (DESYREL ) 50 MG tablet Take 0.5-1 tablets (25-50 mg total) by mouth at bedtime as needed. for sleep (Patient not taking: Reported on 03/19/2024) 90 tablet 1     Physical Exam: Blood pressure 113/71, pulse (!) 102, temperature 97.9 F (36.6 C), resp. rate 18, height 4' 11 (1.499 m), weight 66.2 kg, SpO2 100%. General: pleasant, WD/WN female who is laying in bed in NAD HEENT: head is normocephalic, atraumatic.  Sclera are non-icteric.  Heart: regular, rate, and rhythm.   Lungs: Respiratory effort nonlabored Abd: Soft, mild distension, NT, +BS. No masses, hernias, or organomegaly MS: no BUE or BLE edema Skin: warm and dry  Psych: A&Ox4 with an appropriate affect Neuro: normal speech, thought  process intact, moves all extremities, gait not assessed  Results for orders placed or performed during the hospital encounter of 03/18/24 (from the past 48 hours)  Glucose, capillary     Status: Abnormal   Collection Time: 03/25/24 12:00 PM  Result Value Ref Range   Glucose-Capillary 141 (H) 70 - 99 mg/dL    Comment: Glucose reference range applies only to samples taken after fasting for at least 8 hours.  Glucose, capillary     Status: Abnormal   Collection Time: 03/25/24  5:17 PM  Result Value Ref Range   Glucose-Capillary 205 (H) 70 - 99 mg/dL    Comment: Glucose reference range applies only to samples taken after fasting for at least 8 hours.  Glucose, capillary     Status: Abnormal   Collection Time: 03/25/24  9:49 PM  Result Value Ref Range   Glucose-Capillary 216 (H) 70 - 99 mg/dL    Comment: Glucose reference range applies only to samples taken after fasting for at least 8 hours.  Glucose, capillary     Status: Abnormal   Collection Time: 03/26/24  7:25 AM  Result Value Ref Range   Glucose-Capillary 174 (H) 70 - 99 mg/dL    Comment: Glucose reference range applies only to samples taken after fasting for at least 8 hours.   Glucose, capillary     Status: Abnormal   Collection Time: 03/26/24 11:27 AM  Result Value Ref Range   Glucose-Capillary 226 (H) 70 - 99 mg/dL    Comment: Glucose reference range applies only to samples taken after fasting for at least 8 hours.  Glucose, capillary     Status: Abnormal   Collection Time: 03/26/24  4:20 PM  Result Value Ref Range   Glucose-Capillary 278 (H) 70 - 99 mg/dL    Comment: Glucose reference range applies only to samples taken after fasting for at least 8 hours.  Glucose, capillary     Status: Abnormal   Collection Time: 03/26/24  8:55 PM  Result Value Ref Range   Glucose-Capillary 185 (H) 70 - 99 mg/dL    Comment: Glucose reference range applies only to samples taken after fasting for at least 8 hours.  Glucose, capillary     Status: Abnormal   Collection Time: 03/27/24  7:32 AM  Result Value Ref Range   Glucose-Capillary 256 (H) 70 - 99 mg/dL    Comment: Glucose reference range applies only to samples taken after fasting for at least 8 hours.   No results found.  Anti-infectives (From admission, onward)    Start     Dose/Rate Route Frequency Ordered Stop   03/21/24 2200  sulfamethoxazole -trimethoprim  (BACTRIM  DS) 800-160 MG per tablet 2 tablet        2 tablet Oral Every 12 hours 03/21/24 1448 03/24/24 2121   03/20/24 0600  cefTRIAXone  (ROCEPHIN ) 2 g in sodium chloride  0.9 % 100 mL IVPB  Status:  Discontinued        2 g 200 mL/hr over 30 Minutes Intravenous Every 24 hours 03/19/24 0717 03/21/24 1448   03/19/24 0815  cefTRIAXone  (ROCEPHIN ) 1 g in sodium chloride  0.9 % 100 mL IVPB        1 g 200 mL/hr over 30 Minutes Intravenous  Once 03/19/24 0717 03/19/24 0930   03/19/24 0500  cefTRIAXone  (ROCEPHIN ) 1 g in sodium chloride  0.9 % 100 mL IVPB  Status:  Discontinued        1 g 200 mL/hr over 30 Minutes Intravenous Every 24 hours 03/18/24  2117 03/19/24 0717   03/18/24 1715  vancomycin  (VANCOREADY) IVPB 1500 mg/300 mL        1,500 mg 150 mL/hr over 120  Minutes Intravenous  Once 03/18/24 1642 03/18/24 2011   03/18/24 1645  ceFEPIme  (MAXIPIME ) 2 g in sodium chloride  0.9 % 100 mL IVPB        2 g 200 mL/hr over 30 Minutes Intravenous  Once 03/18/24 1640 03/18/24 1800   03/18/24 1645  metroNIDAZOLE  (FLAGYL ) IVPB 500 mg        500 mg 100 mL/hr over 60 Minutes Intravenous  Once 03/18/24 1640 03/18/24 1831   03/18/24 1645  vancomycin  (VANCOCIN ) IVPB 1000 mg/200 mL premix  Status:  Discontinued        1,000 mg 200 mL/hr over 60 Minutes Intravenous  Once 03/18/24 1640 03/18/24 1642       Assessment/Plan Ascending/ICV Colon Mass - Colonoscopy 1/10 with ulcerated mass, measuring ~3cm, at the ileocecal valve. Path with invasive colorectal adenocarcinoma, moderately differentiated.  - Hgb stable at 7.5 on last check 1/11 - CT CAP w/ no well defined mass on CT but right sided colitis changes, ileocolic mesenteric nodes but otherwise without evidence of metastatic disease.  - CEA ordered - Recommend OR for R colectomy.  The planned procedure and material risks were discussed with the patient. Risks include but are not limited to anesthesia (MI, CVA, prolonged intubation, aspiration, death), pain, bleeding,  infection, scarring, hernia, damage to surrounding structures (blood vessels/nerves/viscus/organs/ureter), ileus, leak from anastomosis, DVT/PE and possible need for stoma/ileostomy. We also discussed typical post-operative care including the possible need for rehab/snf if necessary. The patient's questions were answered to their satisfaction, they voiced understanding and elected to proceed with surgery.  - Will need to back patient back down to CLD and repeat prep before OR. Please do not advance past CLD - We will follow with you  FEN - CLD, NPO 430am per protocol for possible OR tomorrow. IVF per [facility] VTE - SCDs, on hold for anemia ID - None currently Foley - None currently  DM2 HTN HLD CKD3A E Coli bacteremia  E. Coli UTI Hiatal Hernia    I reviewed nursing notes, Consultant (GI) notes, hospitalist notes, last 24 h vitals and pain scores, last 48 h intake and output, last 24 h labs and trends, and last 24 h imaging results.   Ozell CHRISTELLA Shaper, [facility] Surgery 03/27/2024, 10:20 AM Please see Amion for pager number during day hours 7:00am-4:30pm     [1]  Allergies Allergen Reactions   Bacid Other (See Comments)    Unknown    Latex Other (See Comments)    Exact reaction not cited   Norvasc  [Amlodipine ] Swelling   "

## 2024-03-28 ENCOUNTER — Inpatient Hospital Stay (HOSPITAL_COMMUNITY): Admitting: Anesthesiology

## 2024-03-28 ENCOUNTER — Encounter (HOSPITAL_COMMUNITY): Admitting: Anesthesiology

## 2024-03-28 ENCOUNTER — Encounter (HOSPITAL_COMMUNITY): Payer: Self-pay | Admitting: Student

## 2024-03-28 ENCOUNTER — Encounter (HOSPITAL_COMMUNITY)
Admission: EM | Disposition: A | Discharge status: Home / Self Care | Payer: Self-pay | Source: Home / Self Care | Attending: Internal Medicine

## 2024-03-28 DIAGNOSIS — Z7984 Long term (current) use of oral hypoglycemic drugs: Secondary | ICD-10-CM | POA: Diagnosis not present

## 2024-03-28 DIAGNOSIS — C182 Malignant neoplasm of ascending colon: Secondary | ICD-10-CM | POA: Diagnosis present

## 2024-03-28 DIAGNOSIS — N390 Urinary tract infection, site not specified: Secondary | ICD-10-CM | POA: Diagnosis not present

## 2024-03-28 DIAGNOSIS — I10 Essential (primary) hypertension: Secondary | ICD-10-CM

## 2024-03-28 DIAGNOSIS — A419 Sepsis, unspecified organism: Secondary | ICD-10-CM | POA: Diagnosis not present

## 2024-03-28 DIAGNOSIS — E119 Type 2 diabetes mellitus without complications: Secondary | ICD-10-CM | POA: Diagnosis not present

## 2024-03-28 HISTORY — PX: LAPAROSCOPIC PARTIAL COLECTOMY: SHX5907

## 2024-03-28 LAB — BASIC METABOLIC PANEL WITH GFR
Anion gap: 14 (ref 5–15)
BUN: 11 mg/dL (ref 8–23)
CO2: 18 mmol/L — ABNORMAL LOW (ref 22–32)
Calcium: 8.7 mg/dL — ABNORMAL LOW (ref 8.9–10.3)
Chloride: 103 mmol/L (ref 98–111)
Creatinine, Ser: 0.99 mg/dL (ref 0.44–1.00)
GFR, Estimated: 58 mL/min — ABNORMAL LOW
Glucose, Bld: 244 mg/dL — ABNORMAL HIGH (ref 70–99)
Potassium: 3.7 mmol/L (ref 3.5–5.1)
Sodium: 136 mmol/L (ref 135–145)

## 2024-03-28 LAB — CBC WITH DIFFERENTIAL/PLATELET
Abs Immature Granulocytes: 0.06 K/uL (ref 0.00–0.07)
Basophils Absolute: 0 K/uL (ref 0.0–0.1)
Basophils Relative: 0 %
Eosinophils Absolute: 0.1 K/uL (ref 0.0–0.5)
Eosinophils Relative: 1 %
HCT: 26 % — ABNORMAL LOW (ref 36.0–46.0)
Hemoglobin: 7.6 g/dL — ABNORMAL LOW (ref 12.0–15.0)
Immature Granulocytes: 1 %
Lymphocytes Relative: 19 %
Lymphs Abs: 1.4 K/uL (ref 0.7–4.0)
MCH: 23.6 pg — ABNORMAL LOW (ref 26.0–34.0)
MCHC: 29.2 g/dL — ABNORMAL LOW (ref 30.0–36.0)
MCV: 80.7 fL (ref 80.0–100.0)
Monocytes Absolute: 0.5 K/uL (ref 0.1–1.0)
Monocytes Relative: 7 %
Neutro Abs: 5.3 K/uL (ref 1.7–7.7)
Neutrophils Relative %: 72 %
Platelets: 478 K/uL — ABNORMAL HIGH (ref 150–400)
RBC: 3.22 MIL/uL — ABNORMAL LOW (ref 3.87–5.11)
RDW: 16.5 % — ABNORMAL HIGH (ref 11.5–15.5)
WBC: 7.4 K/uL (ref 4.0–10.5)
nRBC: 0 % (ref 0.0–0.2)

## 2024-03-28 LAB — TYPE AND SCREEN
ABO/RH(D): O POS
Antibody Screen: NEGATIVE

## 2024-03-28 LAB — CEA: CEA: 4.4 ng/mL (ref 0.0–4.7)

## 2024-03-28 LAB — GLUCOSE, CAPILLARY
Glucose-Capillary: 222 mg/dL — ABNORMAL HIGH (ref 70–99)
Glucose-Capillary: 210 mg/dL — ABNORMAL HIGH (ref 70–99)
Glucose-Capillary: 202 mg/dL — ABNORMAL HIGH (ref 70–99)
Glucose-Capillary: 227 mg/dL — ABNORMAL HIGH (ref 70–99)
Glucose-Capillary: 264 mg/dL — ABNORMAL HIGH (ref 70–99)
Glucose-Capillary: 299 mg/dL — ABNORMAL HIGH (ref 70–99)

## 2024-03-28 LAB — SURGICAL PCR SCREEN
MRSA, PCR: NEGATIVE
Staphylococcus aureus: NEGATIVE

## 2024-03-28 MED ORDER — LACTATED RINGERS IR SOLN
Status: DC | PRN
  Administered 2024-03-28: 1000 mL

## 2024-03-28 MED ORDER — ENSURE SURGERY PO LIQD
237.0000 mL | Freq: Two times a day (BID) | ORAL | Status: DC
  Administered 2024-03-29 – 2024-03-30 (×3): 237 mL via ORAL

## 2024-03-28 MED ORDER — LIDOCAINE HCL (CARDIAC) PF 100 MG/5ML IV SOSY
PREFILLED_SYRINGE | INTRAVENOUS | Status: DC | PRN
  Administered 2024-03-28: 80 mg via INTRAVENOUS

## 2024-03-28 MED ORDER — PROPOFOL 10 MG/ML IV BOLUS
INTRAVENOUS | Status: DC | PRN
  Administered 2024-03-28: 100 mg via INTRAVENOUS

## 2024-03-28 MED ORDER — BUPIVACAINE-EPINEPHRINE (PF) 0.25% -1:200000 IJ SOLN
INTRAMUSCULAR | Status: AC
  Filled 2024-03-28: qty 30

## 2024-03-28 MED ORDER — PHENOL 1.4 % MT LIQD: 2.0000 | OROMUCOSAL | Status: DC | PRN

## 2024-03-28 MED ORDER — INSULIN ASPART 100 UNIT/ML IJ SOLN
INTRAMUSCULAR | Status: DC | PRN
  Administered 2024-03-28: 4 [IU] via SUBCUTANEOUS

## 2024-03-28 MED ORDER — HYDROMORPHONE HCL 1 MG/ML IJ SOLN: 0.5000 mg | INTRAMUSCULAR | Status: DC | PRN

## 2024-03-28 MED ORDER — DEXAMETHASONE SOD PHOSPHATE PF 10 MG/ML IJ SOLN
INTRAMUSCULAR | Status: DC | PRN
  Administered 2024-03-28: 5 mg via INTRAVENOUS

## 2024-03-28 MED ORDER — OXYCODONE HCL 5 MG PO TABS: 5.0000 mg | ORAL_TABLET | Freq: Once | ORAL | Status: DC | PRN

## 2024-03-28 MED ORDER — ACETAMINOPHEN 10 MG/ML IV SOLN: 1000.0000 mg | Freq: Once | INTRAVENOUS | Status: DC | PRN

## 2024-03-28 MED ORDER — PHENYLEPHRINE HCL-NACL 20-0.9 MG/250ML-% IV SOLN
INTRAVENOUS | Status: DC | PRN
  Administered 2024-03-28: 25 ug/min via INTRAVENOUS

## 2024-03-28 MED ORDER — GABAPENTIN 100 MG PO CAPS
200.0000 mg | ORAL_CAPSULE | Freq: Every day | ORAL | Status: DC
  Administered 2024-03-28 – 2024-03-30 (×3): 200 mg via ORAL
  Filled 2024-03-28 (×3): qty 2

## 2024-03-28 MED ORDER — SODIUM CHLORIDE 0.9 % IV SOLN: INTRAVENOUS | Status: DC

## 2024-03-28 MED ORDER — SODIUM CHLORIDE 0.9 % IV SOLN
2.0000 g | Freq: Two times a day (BID) | INTRAVENOUS | Status: AC
  Administered 2024-03-28: 2 g via INTRAVENOUS
  Filled 2024-03-28: qty 2

## 2024-03-28 MED ORDER — FENTANYL CITRATE (PF) 100 MCG/2ML IJ SOLN
INTRAMUSCULAR | Status: DC | PRN
  Administered 2024-03-28 (×3): 50 ug via INTRAVENOUS

## 2024-03-28 MED ORDER — ONDANSETRON HCL 4 MG/2ML IJ SOLN
INTRAMUSCULAR | Status: DC | PRN
  Administered 2024-03-28: 4 mg via INTRAVENOUS

## 2024-03-28 MED ORDER — ALUM & MAG HYDROXIDE-SIMETH 200-200-20 MG/5ML PO SUSP: 30.0000 mL | Freq: Four times a day (QID) | ORAL | Status: DC | PRN

## 2024-03-28 MED ORDER — OXYCODONE HCL 5 MG PO TABS
2.5000 mg | ORAL_TABLET | Freq: Four times a day (QID) | ORAL | Status: DC | PRN
  Administered 2024-03-29 – 2024-03-30 (×4): 5 mg via ORAL
  Filled 2024-03-28 (×5): qty 1

## 2024-03-28 MED ORDER — SODIUM CHLORIDE 0.9 % IV SOLN: Freq: Three times a day (TID) | INTRAVENOUS | Status: DC | PRN

## 2024-03-28 MED ORDER — ROCURONIUM BROMIDE 100 MG/10ML IV SOLN
INTRAVENOUS | Status: DC | PRN
  Administered 2024-03-28: 50 mg via INTRAVENOUS

## 2024-03-28 MED ORDER — BUPIVACAINE-EPINEPHRINE 0.25% -1:200000 IJ SOLN
INTRAMUSCULAR | Status: DC | PRN
  Administered 2024-03-28: 60 mL

## 2024-03-28 MED ORDER — INSULIN ASPART 100 UNIT/ML IJ SOLN
0.0000 [IU] | INTRAMUSCULAR | Status: DC
  Administered 2024-03-28 (×2): 5 [IU] via SUBCUTANEOUS
  Administered 2024-03-29: 2 [IU] via SUBCUTANEOUS
  Administered 2024-03-29: 1 [IU] via SUBCUTANEOUS
  Administered 2024-03-29 (×2): 5 [IU] via SUBCUTANEOUS
  Administered 2024-03-29: 2 [IU] via SUBCUTANEOUS
  Administered 2024-03-30: 1 [IU] via SUBCUTANEOUS
  Filled 2024-03-28: qty 1
  Filled 2024-03-28: qty 2
  Filled 2024-03-28: qty 5
  Filled 2024-03-28: qty 2
  Filled 2024-03-28: qty 1
  Filled 2024-03-28 (×3): qty 5

## 2024-03-28 MED ORDER — PROCHLORPERAZINE MALEATE 10 MG PO TABS: 10.0000 mg | ORAL_TABLET | Freq: Four times a day (QID) | ORAL | Status: DC | PRN

## 2024-03-28 MED ORDER — SUCCINYLCHOLINE CHLORIDE 200 MG/10ML IV SOSY
PREFILLED_SYRINGE | INTRAVENOUS | Status: DC | PRN
  Administered 2024-03-28: 100 mg via INTRAVENOUS

## 2024-03-28 MED ORDER — INSULIN ASPART 100 UNIT/ML IJ SOLN: 0.0000 [IU] | INTRAMUSCULAR | Status: DC | PRN

## 2024-03-28 MED ORDER — ENOXAPARIN SODIUM 40 MG/0.4ML IJ SOSY
40.0000 mg | PREFILLED_SYRINGE | INTRAMUSCULAR | Status: DC
  Administered 2024-03-29 – 2024-03-31 (×3): 40 mg via SUBCUTANEOUS
  Filled 2024-03-28 (×3): qty 0.4

## 2024-03-28 MED ORDER — KCL IN DEXTROSE-NACL 20-5-0.45 MEQ/L-%-% IV SOLN
INTRAVENOUS | Status: AC
  Filled 2024-03-28 (×2): qty 1000

## 2024-03-28 MED ORDER — SIMETHICONE 80 MG PO CHEW: 40.0000 mg | CHEWABLE_TABLET | Freq: Four times a day (QID) | ORAL | Status: DC | PRN

## 2024-03-28 MED ORDER — SODIUM CHLORIDE 0.9% FLUSH: 3.0000 mL | INTRAVENOUS | Status: DC | PRN

## 2024-03-28 MED ORDER — MELATONIN 3 MG PO TABS
3.0000 mg | ORAL_TABLET | Freq: Every evening | ORAL | Status: DC | PRN
  Administered 2024-03-30: 3 mg via ORAL
  Filled 2024-03-28: qty 1

## 2024-03-28 MED ORDER — LACTATED RINGERS IV SOLN: INTRAVENOUS | Status: DC | PRN

## 2024-03-28 MED ORDER — FENTANYL CITRATE (PF) 250 MCG/5ML IJ SOLN
INTRAMUSCULAR | Status: AC
  Filled 2024-03-28: qty 5

## 2024-03-28 MED ORDER — SODIUM CHLORIDE 0.9 % IV SOLN: 250.0000 mL | INTRAVENOUS | Status: DC | PRN

## 2024-03-28 MED ORDER — CALCIUM POLYCARBOPHIL 625 MG PO TABS
625.0000 mg | ORAL_TABLET | Freq: Two times a day (BID) | ORAL | Status: DC
  Administered 2024-03-28 – 2024-03-31 (×7): 625 mg via ORAL
  Filled 2024-03-28 (×7): qty 1

## 2024-03-28 MED ORDER — SODIUM CHLORIDE 0.9 % IV SOLN
12.5000 mg | Freq: Once | INTRAVENOUS | Status: AC
  Administered 2024-03-28: 12.5 mg via INTRAVENOUS
  Filled 2024-03-28: qty 12.5

## 2024-03-28 MED ORDER — MENTHOL 3 MG MT LOZG: 1.0000 | LOZENGE | OROMUCOSAL | Status: DC | PRN

## 2024-03-28 MED ORDER — PROCHLORPERAZINE EDISYLATE 10 MG/2ML IJ SOLN: 5.0000 mg | Freq: Four times a day (QID) | INTRAMUSCULAR | Status: DC | PRN

## 2024-03-28 MED ORDER — MAGIC MOUTHWASH: 15.0000 mL | Freq: Four times a day (QID) | ORAL | Status: DC | PRN

## 2024-03-28 MED ORDER — LACTATED RINGERS IV SOLN: INTRAVENOUS | Status: DC

## 2024-03-28 MED ORDER — SUGAMMADEX SODIUM 200 MG/2ML IV SOLN
INTRAVENOUS | Status: DC | PRN
  Administered 2024-03-28: 200 mg via INTRAVENOUS

## 2024-03-28 MED ORDER — 0.9 % SODIUM CHLORIDE (POUR BTL) OPTIME
TOPICAL | Status: DC | PRN
  Administered 2024-03-28: 2000 mL

## 2024-03-28 MED ORDER — OXYCODONE HCL 5 MG/5ML PO SOLN: 5.0000 mg | Freq: Once | ORAL | Status: DC | PRN

## 2024-03-28 MED ORDER — METOPROLOL TARTRATE 5 MG/5ML IV SOLN: 5.0000 mg | Freq: Four times a day (QID) | INTRAVENOUS | Status: DC | PRN

## 2024-03-28 MED ORDER — SIMETHICONE 40 MG/0.6ML PO SUSP: 80.0000 mg | Freq: Four times a day (QID) | ORAL | Status: DC | PRN

## 2024-03-28 MED ORDER — SODIUM CHLORIDE 0.9% FLUSH
3.0000 mL | Freq: Two times a day (BID) | INTRAVENOUS | Status: DC
  Administered 2024-03-28 – 2024-03-30 (×4): 3 mL via INTRAVENOUS

## 2024-03-28 MED ORDER — ACETAMINOPHEN 500 MG PO TABS
1000.0000 mg | ORAL_TABLET | Freq: Four times a day (QID) | ORAL | Status: DC
  Administered 2024-03-28 – 2024-03-31 (×12): 1000 mg via ORAL
  Filled 2024-03-28 (×12): qty 2

## 2024-03-28 MED ORDER — SALINE SPRAY 0.65 % NA SOLN: 1.0000 | Freq: Four times a day (QID) | NASAL | Status: DC | PRN

## 2024-03-28 MED ORDER — PHENYLEPHRINE HCL (PRESSORS) 10 MG/ML IV SOLN
INTRAVENOUS | Status: DC | PRN
  Administered 2024-03-28: 80 ug via INTRAVENOUS

## 2024-03-28 MED ORDER — NAPHAZOLINE-GLYCERIN 0.012-0.25 % OP SOLN: 1.0000 [drp] | Freq: Four times a day (QID) | OPHTHALMIC | Status: DC | PRN

## 2024-03-28 MED ORDER — FENTANYL CITRATE (PF) 50 MCG/ML IJ SOSY: 25.0000 ug | PREFILLED_SYRINGE | INTRAMUSCULAR | Status: DC | PRN

## 2024-03-28 MED ORDER — ALVIMOPAN 12 MG PO CAPS
12.0000 mg | ORAL_CAPSULE | Freq: Two times a day (BID) | ORAL | Status: DC
  Filled 2024-03-28: qty 1

## 2024-03-28 MED ORDER — CHLORHEXIDINE GLUCONATE 0.12 % MT SOLN
15.0000 mL | Freq: Once | OROMUCOSAL | Status: AC
  Administered 2024-03-28: 15 mL via OROMUCOSAL

## 2024-03-28 NOTE — Transfer of Care (Signed)
 Immediate Anesthesia Transfer of Care Note  Patient: Misty Cervantes  Procedure(s) Performed: LAPAROSCOPIC PROXIMAL RIGHT COLECTOMY,  Patient Location: PACU  Anesthesia Type:General  Level of Consciousness: awake and alert   Airway & Oxygen Therapy: Patient Spontanous Breathing and Patient connected to nasal cannula oxygen  Post-op Assessment: Report given to RN and Post -op Vital signs reviewed and stable  Post vital signs: Reviewed and stable  Last Vitals:  Vitals Value Taken Time  BP 165/71 03/28/24 11:10  Temp    Pulse 87 03/28/24 11:11  Resp 26 03/28/24 11:11  SpO2 95 % 03/28/24 11:11  Vitals shown include unfiled device data.  Last Pain:  Vitals:   03/28/24 0800  TempSrc:   PainSc: 0-No pain         Complications: No notable events documented.

## 2024-03-28 NOTE — Progress Notes (Signed)
 NP Isaiah Lever was notified that patient is nauseous and if PRN antiemetic could be ordered. NP Lever was notified that patient didn't complete bowel prep for surgery.

## 2024-03-28 NOTE — Progress Notes (Signed)
 Patient refused to drink pre-surgery Ensure. She was educated that this drink is used to help with recovery after surgery. Patient was unable to complete bowel prep. Will keep NPO now until scheduled surgery time.

## 2024-03-28 NOTE — Progress Notes (Signed)
 Patient states she feels sick. She states now that she is ok getting surgery because she needs it.

## 2024-03-28 NOTE — Op Note (Signed)
 03/28/2024  11:11 AM  PATIENT:  Misty Cervantes  79 y.o. female  Patient Care Team: Lucius Krabbe, NP as PCP - General (Family Medicine) Melodi Lerner, MD as Consulting Physician (Orthopedic Surgery) Camillo Golas, MD as Consulting Physician (Ophthalmology) Steffani Garnette RIGGERS as Physician Assistant (Physician Assistant) Jannis Kate Norris, MD as Consulting Physician (Obstetrics and Gynecology) Sheldon Standing, MD as Consulting Physician (General Surgery) Abran Norleen SAILOR, MD as Consulting Physician (Gastroenterology)  PRE-OPERATIVE DIAGNOSIS:  CECAL COLON CANCER  POST-OPERATIVE DIAGNOSIS:  ASCENDING COLON CANCER  PROCEDURE:   -LAPAROSCOPIC, PROXIMAL RIGHT COLECTOMY WITH ANASTOMOSIS -TRANSVERSUS ABDOMINIS PLANE (TAP) BLOCK - BILATERAL  SURGEON:  Standing KYM Sheldon, MD  ASSISTANT:  CANDIE Ronnald Nick, PA-S, [employer]   ANESTHESIA:  General endotracheal intubation anesthesia (GETA) and Regional TRANSVERSUS ABDOMINIS PLANE (TAP) nerve block -BILATERAL for perioperative & postoperative pain control at the level of the transverse abdominis & preperitoneal spaces along the flank at the anterior axillary line, from subcostal ridge to iliac crest under laparoscopic guidance provided with 60 mL of bupivicaine 0.25% with epinephrine   Estimated Blood Loss (EBL):   Total I/O In: 600 [I.V.:500; IV Piggyback:100] Out: 20 [Blood:20].   (See anesthesia record)  Delay start of Pharmacological VTE agent (>24hrs) due to concerns of significant anemia, surgical blood loss, or risk of bleeding?:  no  DRAINS: (None)  SPECIMEN:  Colon - proximal right  DISPOSITION OF SPECIMEN:  Pathology  COUNTS:  Sponge, needle, & instrument counts CORRECT at the conclusion of the case.      PLAN OF CARE: Admit to inpatient   PATIENT DISPOSITION:  PACU - hemodynamically stable.  INDICATION:    Pleasant elderly woman with persistent iron  deficiency anemia.  Underwent extensive workup and found  to have mass in proximal right colon near cecum.  Biopsy with adenocarcinoma.  Surgical consultation requested I recommended segmental resection:  The anatomy & physiology of the digestive tract was discussed.  The pathophysiology was discussed.  Natural history risks without surgery was discussed.   I worked to give an overview of the disease and the frequent need to have multispecialty involvement.  I feel the risks of no intervention will lead to serious problems that outweigh the operative risks; therefore, I recommended a partial colectomy to remove the pathology.  Laparoscopic & open techniques were discussed.   Risks such as bleeding, infection, abscess, leak, reoperation, possible ostomy, hernia, heart attack, death, and other risks were discussed.  I noted a good likelihood this will help address the problem.   Goals of post-operative recovery were discussed as well.  We will work to minimize complications.  An educational handout on the pathology was given as well.  Questions were answered.    The patient expresses understanding & wishes to proceed with surgery.  OR FINDINGS:   Patient had sessile firm mass at the junction tween the cecum and the proximal ascending colon on the mesenteric side consistent with cancer.  No obvious metastatic disease on visceral parietal peritoneum or liver.  It is an isoperistaltic ileocolonic anastomosis that rests in the  region.  CASE DATA: Type of patient?: LDOW CASE (Surgical Hospitalist [facility] Inpatient) Status of Case? URGENT Add On Infection Present At Time Of Surgery (PATOS)?  NO     DESCRIPTION:   Informed consent was confirmed.  The patient underwent general anaesthesia without difficulty.  The patient was positioned with arms tucked & secured appropriately.  VTE prevention in place.  The patient's abdomen was clipped, prepped, & draped in  a sterile fashion.  Surgical timeout confirmed our plan.  The patient was positioned in reverse  Trendelenburg.  Abdominal entry was gained using Varess technique at the left subcostal ridge on the anterior abdominal wall.  No elevated EtCO2 noted.  Port placed.  Camera inspection revealed no injury.  Extra ports were carefully placed under direct laparoscopic visualization.  Saw no evidence of any obvious metastatic disease.  Could feel a firm mass in the proximal ascending colon that correlated with colonoscopy findings  I mobilized & reflected the greater omentum in the upper abdomen.  Position the small bowel into the left lower quadrant and pelvis.  I was able to elevate the proximal colon to isolate the ileocolonic pedicle.  I scored the ileal mesentery just proximal to that.   I carried that further dissection in a medial to lateral fashion.  I was able to bluntly get into the retro-mesenteric plane on the right side.  I freed the proximal right sided colonic mesentery off the retroperitoneum including the duodenal sweep, pancreatic head, & Gerota's fascia of the right kidney. I was able to get underneath the hepatic flexure.  I was able to get underneath the proximal and mid transverse colon.  I isolated the proximal ileocecal pedicle.  I skeletonized it & transected the vessels.    I then proceeded to mobilize the terminal ileum & proximal right colon in a lateral to medial fashion.  I mobilized the distal ileal mesentery off its retroperitoneal and pelvic attachments.  I mobilized the ascending colon off It is side wall attachments to the paracolic gutter and retroperitoneum.  I also mobilized the greater omentum off the mid transverse colon and mobilized the mid to proximal transverse colon in a superior to inferior fashion.  This allowed me to mobilize the hepatic flexure and get a complete mobilization of the proximal right colon to the mid-transverse colon.   I could isolate the pathology.   Chose an area in the distal ileum and transected the mesentery radially chose an area in the  transverse colon just proximal to a dominant middle colic artery pedicle and transected that in a radial fashion to good location.  We confirmed good viability of the ileum and transverse colon to heave healthy ends planned for anastomosis. When he went ahead and proceeded with transection.  We transected at the distal ileum with a laparoscopic stapler 60mm blue load.  We then transected transverse colon with stapler 60mm blue load.  We confirmed hemostasis.     I did a side-to-side stapled anastomosis of ileum to mid-transverse colon using a 60mm white load in an isoperistaltic fashion.  (Distal stump of ileum to mid transverse colon for the distal end of the anastomosis.  Proximal end of colon stump to more proximal ileum for the proximal end of the anastomosis).  I sewed the common staple channel wound with an absorbable suture (V-lock) in a running [place] fashion from each corner and meeting in the center.  We did meticulous inspection prove an airtight closure.  I protected the anastomosis line with an anterior omentopexy of greater omentum using V lock suture.  Because her small bowel was stretched out and very floppy/mobile, I also used one of the stitches to help close the mesenteric defect between the redundant floppy ileum and proximal mid transverse colon to avoid any persistent Peterson mesenteric defects  We did reinspection of the abdomen.  Hemostasis was good.   Ureters, retroperitoneum, and bowel uninjured.  The anastomosis looked healthy.  Endoluminal gas was evacuated.  We placed the wound protector through the suprapubic 12mm port site after it was enlarged in a Pfannenstiel fashion.  Specimen removed without incident.   Ports & wound protector removed.  Hemostasis was good.  Sterile unused instruments were used from this point.  I closed the skin at the port sites using Monocryl stitch and sterile dressing.  I closed the extraction wound using a 0 Vicryl vertical peritoneal closure and a  #1 PDS transverse anterior rectal fascial closure like a small Pfannenstiel closure. I closed the skin with some interrupted Monocryl stitches.  I placed sterile dressings.     Patient is being extubated go to recovery room. I discussed postop care with the patient in detail the office & in the holding area. Instructions are written. I discussed operative findings, updated the patient's status, discussed probable steps to recovery, and gave postoperative recommendations to the patient's niece, Delon Bar.  Recommendations were made.  Questions were answered.  She expressed understanding & appreciation.  Elspeth KYM Schultze, M.D., F.A.C.S. Gastrointestinal and Minimally Invasive Surgery [facility] [address], [address] [place], [place] [address] [phone] Main / Paging

## 2024-03-28 NOTE — Progress Notes (Signed)
 OT Cancellation Note  Patient Details Name: Misty Cervantes MRN: [id number] DOB: Sep 20, 1945   Cancelled Treatment:    Reason Eval/Treat Not Completed: Medical issues which prohibited therapy;Patient at procedure or test/ unavailable. Per chart, pt off the unit for surgery today. OT will follow acutely and see as medically appropriate.   Trevon Strothers L. Liliane Mallis, OTR/L  03/28/2024, 7:45 AM

## 2024-03-28 NOTE — Progress Notes (Addendum)
 " PROGRESS NOTE  [name]  DOB: 08-Nov-1945  PCP: Lucius Krabbe, NP FMW:[id number]  DOA: 03/18/2024  LOS: 10 days  Hospital Day: 11  Subjective: Patient was seen and examined this afternoon.  Underwent laparoscopic proximal right colectomy with anastomosis this morning  At the time of my evaluation, patient was lying on bed.  Not in distress. Sleeping, opens eyes on command. Pain controlled.  Afebrile, hemodynamically stable Labs this morning hemoglobin 7.6, renal function normal, blood glucose elevated to 22.  Brief narrative: Misty Cervantes is a 79 y.o. female with PMH significant for T2DM, HTN, HLD, CKD 3a, dysphagia, osteoarthritis, h/o gastric ulcers 1/4, patient presented to ED with complaint of weakness, nausea, and confusion.   Was noted to have fever to 102.6 with tachycardia,  Urinalysis showed hazy straw-colored urine with moderate leukocytes, many bacteria Started on Vancomycin /Cefepime /Metronidazole .  Also Hgb was low at 7.6 on arrival Admitted to [facility] Blood culture and urine culture sent on admission grew E. Coli  Her hospital course was complicated by acute drop in hemoglobin.  GI was consulted. 1/10, underwent colonoscopy which showed potential malignant tumor at the ileocecal valve.  Biopsy resulted to show invasive colorectal adenocarcinoma See below for details  Assessment and plan: Invasive colorectal adenocarcinoma 1/10, underwent colonoscopy for evaluation of anemia.  It showed potential malignant tumor at the ileocecal valve.  Biopsy sent. CT scan did not show any cecal mass, only suggested right-sided colitis infectious versus inflammatory. 1/13, biopsy resulted showing invasive colorectal adenocarcinoma.  General surgery was consulted 1/14, underwent laparoscopic proximal right colectomy with anastomosis Currently in postop ileus. N.p.o. Start IV hydration with NS at 75 mL/h..  Monitor electrolytes   Sepsis secondary to UTI  E coli  bacteremia and UTI Primarily admitted for sepsis secondary to UTI Blood culture as well as urine culture grew E. Coli. Repeat blood culture sent on 1/6 has not shown any growth so far. Initially improved with IV Rocephin  and later transitioned to oral Bactrim .  Completed 7-day course of antibiotics on 1/10. 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 Hemoglobin at the lowest of 6.3 on 1/5.  Anemia panel showed low ferritin and low iron  level.  She received 1 unit of blood on 1/5 Hemoglobin has been stable since then 1/10, colonoscopy was done and ultimately diagnosed to have colorectal cancer as discussed above.  Recent Labs    03/19/24 0431 03/19/24 1631 03/23/24 0519 03/24/24 0520 03/25/24 0503 03/27/24 1219 03/28/24 0458  HGB  --    < > 7.4* 7.5* 7.5* 7.5* 7.6*  MCV  --    < > 80.3 79.6* 80.2 81.4 80.7  VITAMINB12 2,598*  --   --   --   --   --   --   FERRITIN 52  --   --   --   --   --   --   TIBC 357  --   --   --   --   --   --   IRON  <10*  --   --   --   --   --   --    < > = values in this interval not displayed.   Acute multiple encephalopathy  MCI (mild cognitive impairment) Was confused at presentation likely due to UTI.  Family also has concerns about  developing dementia at home. Had episodes of sundowning in the hospital. Mental status gradually improved to normal.  Now able to have meaningful conversation. Continue delirium precautions Outpatient neurology follow-up.  Type 2 diabetes mellitus uncontrolled with hyperglycemia A1c 10.7 on 02/28/2024 PTA meds-glipizide  10 mg BID, metformin  500 mg BID, Jardiance  Diabetes care coordinator consult appreciated Oral meds on hold.  Patient is NPO.  Start SSI with Accu-Cheks q 4hrs. Recent Labs  Lab 03/27/24 2159 03/28/24 0646 03/28/24 0921  03/28/24 1147 03/28/24 1237  GLUCAP 219* 222* 210* 202* 227*   HTN Blood pressure currently controlled on losartan  50 mg daily. I would hold losartan  perioperatively.  Continue to monitor blood pressure.  IV hydralazine  as needed  HLD Continue simvastatin  CKD 3A Creatinine stable at baseline Recent Labs  Lab 03/22/24 0513 03/23/24 0519 03/24/24 0520 03/25/24 0503 03/28/24 0458  NA 134* 133* 136 137 136  K 3.7 4.3 3.6 3.5 3.7  CL 101 101 103 105 103  CO2 21* 20* 20* 21* 18*  GLUCOSE 243* 225* 212* 70 244*  BUN 16 18 14 9 11   CREATININE 0.90 1.17* 1.15* 1.18* 0.99  CALCIUM  8.7* 8.5* 8.4* 8.5* 8.7*   Generalized weakness In the setting of acute illness and dehydration PT/OT consulted.  Home health recommended. Fall precautions  Systolic ejection murmur Likely age-related aortic sclerosis, confirmed with echocardiogram 1/8. No need of further workup   Nutrition Status:         Mobility:   PT Orders: Active   PT Follow up Rec: Home Health Pt1/02/2025 1153    Goals of care   Code Status: Full Code     DVT prophylaxis:  enoxaparin  (LOVENOX ) injection 40 mg Start: 03/29/24 0800 SCD's Start: 03/28/24 1226 Place and maintain sequential compression device Start: 03/19/24 1332   Antimicrobials: Completed the course of Bactrim  Fluid: None Consultants: GI Family Communication: Family not at bedside.  Status: Inpatient Level of care:  Med-Surg   Patient is from: Home Needs to continue in-hospital care: POD 0 Anticipated d/c to: Ongoing workup.  Ultimately home with home health PT.     Diet:  Diet Order             Diet clear liquid Room service appropriate? Yes; Fluid consistency: Thin  Diet effective now                   Scheduled Meds:  acetaminophen   1,000 mg Oral Q6H   [START ON 03/29/2024] alvimopan   12 mg Oral BID   [START ON 03/29/2024] enoxaparin  (LOVENOX ) injection  40 mg Subcutaneous Q24H   feeding supplement  237 mL Oral BID  BM   gabapentin   200 mg Oral QHS   insulin  aspart  0-9 Units Subcutaneous Q4H   losartan   50 mg Oral Daily   ondansetron  (ZOFRAN ) IV  2 mg Intravenous Once   pantoprazole   40 mg Oral Daily   polycarbophil  625 mg Oral BID   pravastatin   40 mg Oral QHS   sodium chloride  flush  3 mL Intravenous Q12H    PRN meds: sodium chloride , [START ON 03/29/2024] sodium chloride , acetaminophen  **OR** acetaminophen , alum & mag hydroxide-simeth, alum & mag hydroxide-simeth, bisacodyl , hydrALAZINE , HYDROmorphone  (DILAUDID ) injection, hydrOXYzine , magic mouthwash, melatonin, menthol , metoprolol  tartrate, naphazoline-glycerin , ondansetron  (ZOFRAN ) IV, oxyCODONE , phenol, prochlorperazine  **OR** prochlorperazine , senna-docusate, simethicone , simethicone , sodium chloride , sodium chloride  flush   Infusions:   sodium chloride      [START ON 03/29/2024] sodium chloride      sodium chloride   cefoTEtan  (CEFOTAN ) IV     dextrose  5 % and 0.45 % NaCl with KCl 20 mEq/L 50 mL/hr at 03/28/24 1442      Antimicrobials: Anti-infectives (From admission, onward)    Start     Dose/Rate Route Frequency Ordered Stop   03/28/24 2000  cefoTEtan  (CEFOTAN ) 2 g in sodium chloride  0.9 % 100 mL IVPB        2 g 200 mL/hr over 30 Minutes Intravenous Every 12 hours 03/28/24 1226 03/29/24 0959   03/28/24 0600  cefoTEtan  (CEFOTAN ) 2 g in sodium chloride  0.9 % 100 mL IVPB        2 g 200 mL/hr over 30 Minutes Intravenous On call to O.R. 03/27/24 1131 03/28/24 0923   03/27/24 1400  neomycin  (MYCIFRADIN ) tablet 1,000 mg       Placed in And Linked Group   1,000 mg Oral 3 times per day 03/27/24 1131 03/27/24 2236   03/27/24 1400  metroNIDAZOLE  (FLAGYL ) tablet 1,000 mg       Placed in And Linked Group   1,000 mg Oral 3 times per day 03/27/24 1131 03/27/24 2234   03/21/24 2200  sulfamethoxazole -trimethoprim  (BACTRIM  DS) 800-160 MG per tablet 2 tablet        2 tablet Oral Every 12 hours 03/21/24 1448 03/24/24 2121   03/20/24 0600   cefTRIAXone  (ROCEPHIN ) 2 g in sodium chloride  0.9 % 100 mL IVPB  Status:  Discontinued        2 g 200 mL/hr over 30 Minutes Intravenous Every 24 hours 03/19/24 0717 03/21/24 1448   03/19/24 0815  cefTRIAXone  (ROCEPHIN ) 1 g in sodium chloride  0.9 % 100 mL IVPB        1 g 200 mL/hr over 30 Minutes Intravenous  Once 03/19/24 0717 03/19/24 0930   03/19/24 0500  cefTRIAXone  (ROCEPHIN ) 1 g in sodium chloride  0.9 % 100 mL IVPB  Status:  Discontinued        1 g 200 mL/hr over 30 Minutes Intravenous Every 24 hours 03/18/24 2117 03/19/24 0717   03/18/24 1715  vancomycin  (VANCOREADY) IVPB 1500 mg/300 mL        1,500 mg 150 mL/hr over 120 Minutes Intravenous  Once 03/18/24 1642 03/18/24 2011   03/18/24 1645  ceFEPIme  (MAXIPIME ) 2 g in sodium chloride  0.9 % 100 mL IVPB        2 g 200 mL/hr over 30 Minutes Intravenous  Once 03/18/24 1640 03/18/24 1800   03/18/24 1645  metroNIDAZOLE  (FLAGYL ) IVPB 500 mg        500 mg 100 mL/hr over 60 Minutes Intravenous  Once 03/18/24 1640 03/18/24 1831   03/18/24 1645  vancomycin  (VANCOCIN ) IVPB 1000 mg/200 mL premix  Status:  Discontinued        1,000 mg 200 mL/hr over 60 Minutes Intravenous  Once 03/18/24 1640 03/18/24 1642       Objective: Vitals:   03/28/24 1423 03/28/24 1530  BP: (!) 147/71 138/72  Pulse: 88 91  Resp: 18 18  Temp: 98.6 F (37 C) 98.7 F (37.1 C)  SpO2: 97% 91%    Intake/Output Summary (Last 24 hours) at 03/28/2024 1637 Last data filed at 03/28/2024 1058 Gross per 24 hour  Intake 1010 ml  Output 20 ml  Net 990 ml   Filed Weights   03/18/24 1615 03/28/24 0748  Weight: 66.2 kg 66 kg   Weight change:  Body mass index is 29.39 kg/m.   Physical Exam: General exam: Pleasant, elderly Caucasian female.  Not  in distress.  Pain controlled Skin: No rashes, lesions or ulcers. HEENT: Atraumatic, normocephalic, no obvious bleeding Lungs: Clear to auscultation bilaterally,  CVS: S1, S2, systolic ejection murmur GI/Abd: Soft,  appropriate postop tenderness, nondistended, bowel sound present,   CNS: Alert, awake, ordered x 3 Extremities: No pedal edema, no calf tenderness,   Data Review: I have personally reviewed the laboratory data and studies available.  F/u labs ordered Unresulted Labs (From admission, onward)     Start     Ordered   04/04/24 0500  Creatinine, serum  (enoxaparin  (LOVENOX )  CrCl >/= 30 mL/min  )  Weekly,   R     Comments: while on enoxaparin  therapy.    03/28/24 1226   03/29/24 0500  Basic metabolic panel  Tomorrow morning,   R        03/28/24 1226   03/29/24 0500  CBC  Tomorrow morning,   R        03/28/24 1226   03/29/24 0500  Magnesium   Tomorrow morning,   R        03/28/24 1226            Signed, Chapman Rota, MD [facility] 03/28/2024  "

## 2024-03-28 NOTE — Interval H&P Note (Signed)
 History and Physical Interval Note:  03/28/2024 7:59 AM  Misty Cervantes  has presented today for surgery, with the diagnosis of COLON CANCER.  The various methods of treatment have been discussed with the patient and family. After consideration of risks, benefits and other options for treatment, the patient has consented to  Procedures: LAPAROSCOPIC PARTIAL COLECTOMY (N/A) as a surgical intervention.  The patient's history has been reviewed, patient examined, no change in status, stable for surgery.  I have reviewed the patient's chart and labs.  Questions were answered to the patient's satisfaction.    I have re-reviewed the the patient's records, history, medications, and allergies.  I have re-examined the patient.  I again discussed intraoperative plans and goals of post-operative recovery.  The patient agrees to proceed.  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   Neoplasm of uncertain behavior of cecum 03/26/2024   Ileocecal ulcer 03/26/2024   Mass of cecum 03/24/2024   Anemia due to chronic blood loss 03/24/2024   Heme positive stool 03/24/2024   Melena 03/22/2024   Acute blood loss anemia 03/22/2024   History of peptic ulcer disease 03/22/2024   MCI (mild cognitive impairment) 03/20/2024   E coli bacteremia 03/19/2024   Sepsis secondary to UTI (HCC) 03/18/2024   Microcytic anemia 03/18/2024   Generalized weakness 03/18/2024   QT prolongation 03/18/2024    Pain in left wrist 11/03/2023   Primary insomnia 12/07/2021   Pneumonia of both lower lobes due to infectious organism    Chronic kidney disease, stage 3a (HCC)    Spinal stenosis of lumbar region 05/13/2020   Aortic atherosclerosis 06/22/2019   Type 2 diabetes mellitus with hyperglycemia, without long-term current use of insulin  (HCC) 06/18/2016   OA (osteoarthritis) of knee 11/03/2015   Hyperlipidemia associated with type 2 diabetes mellitus (HCC) 07/23/2014   Genu valgum 08/11/2012   Hypertension associated with diabetes (HCC) 02/25/2010    Past Medical History:  Diagnosis Date   Abdominal pain 08/23/2021   Chronic kidney disease (CKD), stage III (moderate) (HCC)    followed by pcp   Dysphagia 01/22/2021   Full dentures    HCAP (healthcare-associated pneumonia) 08/22/2021   Headache 08/23/2021   History of 2019 novel coronavirus disease (COVID-19) 04/05/2019   result in care everywhere ,  per pt mild symptoms that resolved   History of gastric ulcer 2013   non-bleeding   Hyperlipemia    Hypokalemia    Hyponatremia 08/23/2021   Low serum potassium level 01/29/2021   Mixed stress and urge incontinence    Osteoarthritis    Presence of pessary    Respiratory distress 11/28/2021   Sepsis (HCC) 11/28/2021   Type 2 diabetes mellitus (HCC)    followed by pcp   (08-26-2020 per pt checks blood sugar twice weekly, unsure what fasting sugar is)   UTI (urinary tract infection) 08/23/2021   Vaginal vault 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    gyn--- dr jannis, using pessary   Wears glasses     Past Surgical History:  Procedure Laterality Date   BIOPSY OF SKIN SUBCUTANEOUS  TISSUE AND/OR MUCOUS MEMBRANE  03/24/2024   Procedure: BIOPSY, SKIN, SUBCUTANEOUS TISSUE, OR MUCOUS MEMBRANE;  Surgeon: Abran Norleen SAILOR, MD;  Location: [facility];  Service: Gastroenterology;;   CARPAL TUNNEL RELEASE Right    1980s   CATARACT EXTRACTION W/ INTRAOCULAR LENS IMPLANT Bilateral 2013   approx    COLONOSCOPY N/A 03/24/2024   Procedure: COLONOSCOPY;  Surgeon: Abran Norleen SAILOR, MD;  Location: [facility];  Service: Gastroenterology;  Laterality: N/A;   COLONOSCOPY WITH ESOPHAGOGASTRODUODENOSCOPY (EGD)  11/17/2011   COLPOCLEISIS N/A 09/02/2020   Procedure: COLPOCLEISIS, PERINEORRHAPHY;  Surgeon: Jannis Kate Norris, MD;  Location: [facility];  Service: Gynecology;  Laterality: N/A;   CYSTOSCOPY N/A 09/02/2020   Procedure: CYSTOSCOPY;  Surgeon: Jannis Kate Norris, MD;  Location: [facility];  Service: Gynecology;  Laterality: N/A;   ESOPHAGOGASTRODUODENOSCOPY N/A 03/22/2024   Procedure: EGD (ESOPHAGOGASTRODUODENOSCOPY);  Surgeon: Abran Norleen SAILOR, MD;  Location: [facility];  Service: Gastroenterology;  Laterality: N/A;   RETINAL DETACHMENT SURGERY Right 2003   TOTAL HIP ARTHROPLASTY Left 03/02/2010   @[facility]    TOTAL KNEE ARTHROPLASTY Right 11/03/2015   Procedure: RIGHT TOTAL KNEE ARTHROPLASTY;  Surgeon: Dempsey Moan, MD;  Location: [facility];  Service: Orthopedics;  Laterality: Right;   TOTAL VAGINAL HYSTERECTOMY  2012   TVH, anterior repair, TVT (no mesh used for the anterior repair)    Social History   Socioeconomic History   Marital status: Divorced    Spouse name: Not on file   Number of children: Not on file   Years of education: Not on file   Highest education level: Not on file  Occupational History   Occupation: retired    Associate Professor: RETIRED  Tobacco Use   Smoking status: Never   Smokeless tobacco: Never  Vaping Use   Vaping status: Never Used  Substance and Sexual Activity   Alcohol use: No   Drug use: No   Sexual activity: Not Currently    Partners: Male    Birth control/protection: Surgical    Comment: hysterectomy  Other Topics Concern   Not on file  Social History Narrative   Divorced, 2 children; retired.    Custody of grandchildren    Social Drivers of Health   Tobacco Use: Low Risk (03/28/2024)   Patient History    Smoking  Tobacco Use: Never    Smokeless Tobacco Use: Never    Passive Exposure: Not on file  Financial Resource Strain: Low Risk (09/27/2023)   Overall Financial Resource Strain (CARDIA)    Difficulty of Paying Living Expenses: Not hard at all  Food Insecurity: No Food Insecurity (03/18/2024)   Epic    Worried About Programme Researcher, Broadcasting/film/video in the Last Year: Never true    Ran Out of Food in the Last Year: Never true  Transportation Needs: No Transportation Needs (03/18/2024)   Epic    Lack of Transportation (Medical): No    Lack of Transportation (Non-Medical): No  Physical Activity: Inactive (09/27/2023)   Exercise Vital Sign    Days of Exercise per Week: 0 days    Minutes of Exercise per Session: 0 min  Stress: No Stress Concern Present (09/27/2023)   [employer] of Occupational Health - Occupational Stress Questionnaire    Feeling of Stress: Not at all  Social Connections: Socially Isolated (03/18/2024)   Social Connection and Isolation Panel    Frequency of Communication with Friends and Family: More than three times a week    Frequency of Social Gatherings with  Friends and Family: More than three times a week    Attends Religious Services: Never    Active Member of Clubs or Organizations: No    Attends Banker Meetings: Never    Marital Status: Divorced  Catering Manager Violence: Not At Risk (03/18/2024)   Epic    Fear of Current or Ex-Partner: No    Emotionally Abused: No    Physically Abused: No    Sexually Abused: No  Depression (PHQ2-9): Low Risk (09/27/2023)   Depression (PHQ2-9)    PHQ-2 Score: 0  Alcohol Screen: Low Risk (09/27/2023)   Alcohol Screen    Last Alcohol Screening Score (AUDIT): 0  Housing: Low Risk (03/18/2024)   Epic    Unable to Pay for Housing in the Last Year: No    Number of Times Moved in the Last Year: 0    Homeless in the Last Year: No  Utilities: Not At Risk (03/18/2024)   Epic    Threatened with loss of utilities: No  Health Literacy: Adequate  Health Literacy (09/27/2023)   B1300 Health Literacy    Frequency of need for help with medical instructions: Never    Family History  Problem Relation Age of Onset   Diabetes Mother        deceased   Heart attack Mother    Diabetes Sister        x2   Kidney cancer Father    Heart attack Father    Diabetes Sister     Medications Prior to Admission  Medication Sig Dispense Refill Last Dose/Taking   aspirin  EC 81 MG tablet Take 81 mg by mouth daily. Swallow whole.   Past Week   cetirizine (ZYRTEC) 10 MG tablet Take 10 mg by mouth daily.   Past Week   cyanocobalamin  (VITAMIN B12) 1000 MCG tablet Take 1,000 mcg by mouth daily.   Past Week   dimenhyDRINATE (DRAMAMINE) 50 MG tablet Take 50 mg by mouth daily as needed for nausea or dizziness.   Unknown   empagliflozin  (JARDIANCE ) 25 MG TABS tablet Take 25 mg by mouth daily.   Past Week   fluticasone  (FLONASE ) 50 MCG/ACT nasal spray SPRAY 1 SPRAY INTO EACH NOSTRIL TWICE A DAY AS NEEDED FOR ALLERGIES OR RHINITIS (Patient taking differently: Place 1 spray into both nostrils daily as needed for allergies.) 48 mL 0 Unknown   glipiZIDE  (GLUCOTROL  XL) 10 MG 24 hr tablet Take 1 tablet (10 mg total) by mouth 2 (two) times daily after a meal. 180 tablet 1 Past Week   losartan  (COZAAR ) 50 MG tablet Take 1 tablet (50 mg total) by mouth daily. 90 tablet 1 Past Week   metFORMIN  (GLUCOPHAGE -XR) 500 MG 24 hr tablet TAKE 1 TABLET BY MOUTH EVERY DAY WITH BREAKFAST. 180 tablet 1 Past Week   Multiple Vitamin (MULTIVITAMIN WITH MINERALS) TABS tablet Take 2 tablets by mouth daily.   Past Week   pravastatin  (PRAVACHOL ) 40 MG tablet TAKE 1 TABLET BY MOUTH EVERYDAY AT BEDTIME 90 tablet 1 Past Week   VITAMIN D PO Take 2 capsules by mouth daily.   Past Week   ACCU-CHEK GUIDE test strip USE 1 STRIP TWICE A DAY 100 strip 2    Accu-Chek Softclix Lancets lancets CHECK BLOOD SUGARS 1-2 TIMES PER DAY. 100 each 2    empagliflozin  (JARDIANCE ) 10 MG TABS tablet Take 1 tablet  (10 mg total) by mouth daily before breakfast. (Patient not taking: Reported on 03/19/2024) 90 tablet 1 Not Taking  traZODone  (DESYREL ) 50 MG tablet Take 0.5-1 tablets (25-50 mg total) by mouth at bedtime as needed. for sleep (Patient not taking: Reported on 03/19/2024) 90 tablet 1 Not Taking    Current Facility-Administered Medications  Medication Dose Route Frequency Provider Last Rate Last Admin   [MAR Hold] acetaminophen  (TYLENOL ) tablet 650 mg  650 mg Oral Q6H PRN Abran Norleen SAILOR, MD       Or   ILDA Hold] acetaminophen  (TYLENOL ) suppository 650 mg  650 mg Rectal Q6H PRN Abran Norleen SAILOR, MD       [MAR Hold] bisacodyl  (DULCOLAX) EC tablet 5 mg  5 mg Oral Daily PRN Abran Norleen SAILOR, MD       cefoTEtan  (CEFOTAN ) 2 g in sodium chloride  0.9 % 100 mL IVPB  2 g Intravenous On Call to OR Maczis, Michael M, PA-C       chlorhexidine  (PERIDEX ) 0.12 % solution 15 mL  15 mL Mouth/Throat Once Hollis, Kevin D, MD       feeding supplement (ENSURE PRE-SURGERY) liquid 296 mL  296 mL Oral Once Maczis, Michael M, PA-C       [MAR Hold] glipiZIDE  (GLUCOTROL  XL) 24 hr tablet 10 mg  10 mg Oral BID Abran Norleen SAILOR, MD   10 mg at 03/27/24 2236   [MAR Hold] hydrALAZINE  (APRESOLINE ) injection 10 mg  10 mg Intravenous Q8H PRN Perry, John N, MD       [MAR Hold] hydrOXYzine  (ATARAX ) tablet 25 mg  25 mg Oral QHS PRN Alto Isaiah CROME, NP   25 mg at 03/27/24 2241   [MAR Hold] insulin  aspart (novoLOG ) injection 0-15 Units  0-15 Units Subcutaneous TID WC Abran Norleen SAILOR, MD   5 Units at 03/28/24 0711   [MAR Hold] insulin  aspart (novoLOG ) injection 0-5 Units  0-5 Units Subcutaneous QHS Abran Norleen SAILOR, MD   2 Units at 03/27/24 2241   insulin  aspart (novoLOG ) injection 0-7 Units  0-7 Units Subcutaneous Q2H PRN Hollis, Kevin D, MD       lactated ringers  infusion   Intravenous Continuous Hollis, Kevin D, MD 10 mL/hr at 03/28/24 0753 New Bag at 03/28/24 0753   [MAR Hold] losartan  (COZAAR ) tablet 50 mg  50 mg Oral Daily Abran Norleen SAILOR, MD   50 mg at  03/27/24 0808   [facility] Hold] metFORMIN  (GLUCOPHAGE -XR) 24 hr tablet 500 mg  500 mg Oral BID WC Dahal, Binaya, MD   500 mg at 03/27/24 1743   [MAR Hold] ondansetron  (ZOFRAN ) injection 2 mg  2 mg Intravenous Once Abran Norleen SAILOR, MD       [MAR Hold] ondansetron  (ZOFRAN ) injection 4 mg  4 mg Intravenous Q6H PRN Dahal, Binaya, MD   4 mg at 03/28/24 0052   [MAR Hold] pantoprazole  (PROTONIX ) EC tablet 40 mg  40 mg Oral Daily Abran Norleen SAILOR, MD   40 mg at 03/27/24 0808   [MAR Hold] pravastatin  (PRAVACHOL ) tablet 40 mg  40 mg Oral QHS Abran Norleen SAILOR, MD   40 mg at 03/27/24 2234   scopolamine  (TRANSDERM-SCOP) 1 MG/3DAYS 1 mg  1 patch Transdermal On Call to OR Maczis, Michael M, PA-C   1 mg at 03/28/24 0758   [MAR Hold] senna-docusate (Senokot-S) tablet 1 tablet  1 tablet Oral QHS PRN Abran Norleen SAILOR, MD         Allergies[1]  BP 128/70 (BP Location: Left Arm)   Pulse 94   Temp 98.6 F (37 C) (Oral)   Resp 16   Ht  4' 11 (1.499 m)   Wt 66 kg   LMP  (LMP Unknown)   SpO2 96%   BMI 29.39 kg/m   Labs: Results for orders placed or performed during the hospital encounter of 03/18/24 (from the past 48 hours)  Glucose, capillary     Status: Abnormal   Collection Time: 03/26/24 11:27 AM  Result Value Ref Range   Glucose-Capillary 226 (H) 70 - 99 mg/dL    Comment: Glucose reference range applies only to samples taken after fasting for at least 8 hours.  Glucose, capillary     Status: Abnormal   Collection Time: 03/26/24  4:20 PM  Result Value Ref Range   Glucose-Capillary 278 (H) 70 - 99 mg/dL    Comment: Glucose reference range applies only to samples taken after fasting for at least 8 hours.  Glucose, capillary     Status: Abnormal   Collection Time: 03/26/24  8:55 PM  Result Value Ref Range   Glucose-Capillary 185 (H) 70 - 99 mg/dL    Comment: Glucose reference range applies only to samples taken after fasting for at least 8 hours.  Glucose, capillary     Status: Abnormal   Collection Time: 03/27/24   7:32 AM  Result Value Ref Range   Glucose-Capillary 256 (H) 70 - 99 mg/dL    Comment: Glucose reference range applies only to samples taken after fasting for at least 8 hours.  Glucose, capillary     Status: Abnormal   Collection Time: 03/27/24 11:17 AM  Result Value Ref Range   Glucose-Capillary 199 (H) 70 - 99 mg/dL    Comment: Glucose reference range applies only to samples taken after fasting for at least 8 hours.  CBC     Status: Abnormal   Collection Time: 03/27/24 12:19 PM  Result Value Ref Range   WBC 6.3 4.0 - 10.5 K/uL   RBC 3.06 (L) 3.87 - 5.11 MIL/uL   Hemoglobin 7.5 (L) 12.0 - 15.0 g/dL   HCT 75.0 (L) 63.9 - 53.9 %   MCV 81.4 80.0 - 100.0 fL   MCH 24.5 (L) 26.0 - 34.0 pg   MCHC 30.1 30.0 - 36.0 g/dL   RDW 83.4 (H) 88.4 - 84.4 %   Platelets 466 (H) 150 - 400 K/uL   nRBC 0.0 0.0 - 0.2 %    Comment: Performed at [facility], [address], [place], [place] [address]  Glucose, capillary     Status: Abnormal   Collection Time: 03/27/24  4:41 PM  Result Value Ref Range   Glucose-Capillary 181 (H) 70 - 99 mg/dL    Comment: Glucose reference range applies only to samples taken after fasting for at least 8 hours.  Glucose, capillary     Status: Abnormal   Collection Time: 03/27/24  9:59 PM  Result Value Ref Range   Glucose-Capillary 219 (H) 70 - 99 mg/dL    Comment: Glucose reference range applies only to samples taken after fasting for at least 8 hours.  Surgical pcr screen     Status: None   Collection Time: 03/27/24 10:56 PM   Specimen: Nasal Mucosa; Nasal Swab  Result Value Ref Range   MRSA, PCR NEGATIVE NEGATIVE   Staphylococcus aureus NEGATIVE NEGATIVE    Comment: (NOTE) The Xpert SA Assay (FDA approved for NASAL specimens in patients 76 years of age and older), is one component of a comprehensive surveillance program. It is not intended to diagnose infection nor to guide or monitor treatment.  Performed at [facility], [address], [place], [place] [address]   CBC with Differential/Platelet     Status: Abnormal   Collection Time: 03/28/24  4:58 AM  Result Value Ref Range   WBC 7.4 4.0 - 10.5 K/uL   RBC 3.22 (L) 3.87 - 5.11 MIL/uL   Hemoglobin 7.6 (L) 12.0 - 15.0 g/dL   HCT 73.9 (L) 63.9 - 53.9 %   MCV 80.7 80.0 - 100.0 fL   MCH 23.6 (L) 26.0 - 34.0 pg   MCHC 29.2 (L) 30.0 - 36.0 g/dL   RDW 83.4 (H) 88.4 - 84.4 %   Platelets 478 (H) 150 - 400 K/uL   nRBC 0.0 0.0 - 0.2 %   Neutrophils Relative % 72 %   Neutro Abs 5.3 1.7 - 7.7 K/uL   Lymphocytes Relative 19 %   Lymphs Abs 1.4 0.7 - 4.0 K/uL   Monocytes Relative 7 %   Monocytes Absolute 0.5 0.1 - 1.0 K/uL   Eosinophils Relative 1 %   Eosinophils Absolute 0.1 0.0 - 0.5 K/uL   Basophils Relative 0 %   Basophils Absolute 0.0 0.0 - 0.1 K/uL   Immature Granulocytes 1 %   Abs Immature Granulocytes 0.06 0.00 - 0.07 K/uL    Comment: Performed at [facility], [address] [place], [place] [address]  Basic metabolic panel with GFR     Status: Abnormal   Collection Time: 03/28/24  4:58 AM  Result Value Ref Range   Sodium 136 135 - 145 mmol/L   Potassium 3.7 3.5 - 5.1 mmol/L   Chloride 103 98 - 111 mmol/L   CO2 18 (L) 22 - 32 mmol/L   Glucose, Bld 244 (H) 70 - 99 mg/dL    Comment: Glucose reference range applies only to samples taken after fasting for at least 8 hours.   BUN 11 8 - 23 mg/dL   Creatinine, Ser 9.00 0.44 - 1.00 mg/dL   Calcium  8.7 (L) 8.9 - 10.3 mg/dL   GFR, Estimated 58 (L) >60 mL/min    Comment: (NOTE) Calculated using the CKD-EPI Creatinine Equation (2021)    Anion gap 14 5 - 15    Comment: Performed at [facility], [address] [place], [place] [address]  Glucose, capillary     Status: Abnormal   Collection Time: 03/28/24  6:46 AM  Result Value Ref Range   Glucose-Capillary 222 (H) 70 - 99 mg/dL    Comment: Glucose reference range applies only to samples taken after fasting for at  least 8 hours.    Imaging / Studies: CT CHEST ABDOMEN PELVIS W CONTRAST Result Date: 03/24/2024 CLINICAL DATA:  Ulcerative cecal mass. Rule out metastatic disease. * Tracking Code: BO * EXAM: CT CHEST, ABDOMEN, AND PELVIS WITH CONTRAST TECHNIQUE: Multidetector CT imaging of the chest, abdomen and pelvis was performed following the standard protocol during bolus administration of intravenous contrast. RADIATION DOSE REDUCTION: This exam was performed according to the departmental dose-optimization program which includes automated exposure control, adjustment of the mA and/or kV according to patient size and/or use of iterative reconstruction technique. CONTRAST:  80mL OMNIPAQUE  IOHEXOL  300 MG/ML  SOLN COMPARISON:  10/23/2023 chest CT. 08/23/2021 chest abdomen and pelvic CTs. FINDINGS: CT CHEST FINDINGS Cardiovascular: Aortic atherosclerosis. Tortuous thoracic aorta. Mild cardiomegaly. Three vessel coronary artery calcification. No central pulmonary embolism, on this non-dedicated study. Mediastinum/Nodes: No supraclavicular adenopathy. No mediastinal or hilar adenopathy. Anterior mediastinal soft tissue density is similar to  2023 including on image 17/2 and favored to be related to degenerative change of the first rib/sternal joint. Lungs/Pleura: Mild cylindrical bronchiectasis is lower lobe predominant and likely postinfectious/inflammatory. Musculoskeletal: Included within the abdomen pelvic section. CT ABDOMEN PELVIS FINDINGS Hepatobiliary: Normal liver. Normal gallbladder, without biliary ductal dilatation. Pancreas: Normal, without mass or ductal dilatation. Spleen: Normal in size, without focal abnormality. Adrenals/Urinary Tract: Normal adrenal glands. Punctate interpolar left renal collecting system calculus. Left-greater-than-right renal sinus cysts, without hydronephrosis. Degraded evaluation of the pelvis, secondary to beam hardening artifact from left hip arthroplasty. Grossly normal urinary bladder.  Stomach/Bowel: Normal stomach, without wall thickening. The colon is primarily decompressed. There is mild mucosal hyperenhancement which is most apparent in the ascending segment, including on image 78/2. No well-defined cecal mass. Normal terminal ileum and appendix. Normal small bowel. Vascular/Lymphatic: Aortic atherosclerosis. Nodes adjacent the cecum of maximally 5 mm on image 81/2 are enlarged from maximally 3 mm previously. Reproductive: Hysterectomy.  No adnexal mass. Other: Moderate pelvic floor laxity. No free intraperitoneal air. No significant free fluid. No evidence of omental or peritoneal disease. Musculoskeletal: Left hip arthroplasty. Mild convex left thoracolumbar spine curvature. IMPRESSION: 1. Right-sided colitis which could be infectious or inflammatory. No well-defined cecal mass identified. 2. Prominent but not pathologically sized ileocolic mesenteric nodes which could be reactive in the setting of colitis or represent regional nodal metastasis. 3. Otherwise, no evidence of metastatic disease. 4. Incidental findings, including: Coronary artery atherosclerosis. Aortic Atherosclerosis (ICD10-I70.0). Left nephrolithiasis. Electronically Signed   By: Rockey Kilts M.D.   On: 03/24/2024 17:45   ECHOCARDIOGRAM COMPLETE Result Date: 03/22/2024    ECHOCARDIOGRAM REPORT   Patient Name:   NASTASHIA GALLO Date of Exam: 03/22/2024 Medical Rec #:  [id number]       Height:       59.0 in Accession #:    [id number]      Weight:       146.0 lb Date of Birth:  1946/02/21      BSA:          1.613 m Patient Age:    78 years        BP:           143/56 mmHg Patient Gender: F               HR:           89 bpm. Exam Location:  Inpatient Procedure: 2D Echo, Cardiac Doppler and Color Doppler (Both Spectral and Color            Flow Doppler were utilized during procedure). Indications:    Murmur R01.1  History:        Patient has no prior history of Echocardiogram examinations.                 Risk Factors:Diabetes.   Sonographer:    Sydnee Wilson RDCS Referring Phys: [id number] BINAYA DAHAL IMPRESSIONS  1. Left ventricular ejection fraction, by estimation, is 65 to 70%. The left ventricle has normal function. The left ventricle has no regional wall motion abnormalities. There is moderate asymmetric left ventricular hypertrophy of the basal-septal segment. Left ventricular diastolic parameters are consistent with Grade I diastolic dysfunction (impaired relaxation).  2. Right ventricular systolic function is normal. The right ventricular size is normal.  3. The mitral valve is normal in structure. Trivial mitral valve regurgitation. No evidence of mitral stenosis.  4. The aortic valve is tricuspid. There is mild calcification of the aortic valve. Aortic  valve regurgitation is trivial. Aortic valve sclerosis is present, with no evidence of aortic valve stenosis.  5. The inferior vena cava is normal in size with greater than 50% respiratory variability, suggesting right atrial pressure of 3 mmHg. Comparison(s): No prior Echocardiogram. Conclusion(s)/Recommendation(s): Otherwise normal echocardiogram, with minor abnormalities described in the report. FINDINGS  Left Ventricle: Left ventricular ejection fraction, by estimation, is 65 to 70%. The left ventricle has normal function. The left ventricle has no regional wall motion abnormalities. The left ventricular internal cavity size was normal in size. There is  moderate asymmetric left ventricular hypertrophy of the basal-septal segment. Left ventricular diastolic parameters are consistent with Grade I diastolic dysfunction (impaired relaxation). Right Ventricle: The right ventricular size is normal. No increase in right ventricular wall thickness. Right ventricular systolic function is normal. Left Atrium: Left atrial size was normal in size. Right Atrium: Right atrial size was normal in size. Pericardium: There is no evidence of pericardial effusion. Mitral Valve: The mitral valve is  normal in structure. Trivial mitral valve regurgitation. No evidence of mitral valve stenosis. Tricuspid Valve: The tricuspid valve is not well visualized. Tricuspid valve regurgitation is trivial. No evidence of tricuspid stenosis. Aortic Valve: The aortic valve is tricuspid. There is mild calcification of the aortic valve. Aortic valve regurgitation is trivial. Aortic valve sclerosis is present, with no evidence of aortic valve stenosis. Aortic valve mean gradient measures 8.5 mmHg. Aortic valve peak gradient measures 14.8 mmHg. Aortic valve area, by VTI measures 1.90 cm. Pulmonic Valve: The pulmonic valve was not well visualized. Pulmonic valve regurgitation is not visualized. No evidence of pulmonic stenosis. Aorta: The aortic root, ascending aorta and aortic arch are all structurally normal, with no evidence of dilitation or obstruction. Venous: The inferior vena cava is normal in size with greater than 50% respiratory variability, suggesting right atrial pressure of 3 mmHg. IAS/Shunts: The atrial septum is grossly normal.  LEFT VENTRICLE PLAX 2D LVIDd:         3.00 cm     Diastology LVIDs:         1.90 cm     LV e' medial:    5.44 cm/s LV PW:         0.80 cm     LV E/e' medial:  13.5 LV IVS:        1.79 cm     LV e' lateral:   7.94 cm/s LVOT diam:     1.63 cm     LV E/e' lateral: 9.3 LV SV:         70 LV SV Index:   44 LVOT Area:     2.10 cm  LV Volumes (MOD) LV vol d, MOD A2C: 38.7 ml LV vol d, MOD A4C: 61.9 ml LV vol s, MOD A2C: 21.0 ml LV vol s, MOD A4C: 24.0 ml LV SV MOD A2C:     17.7 ml LV SV MOD A4C:     61.9 ml LV SV MOD BP:      27.3 ml RIGHT VENTRICLE RV S prime:     13.50 cm/s TAPSE (M-mode): 1.9 cm LEFT ATRIUM           Index        RIGHT ATRIUM          Index LA diam:      2.70 cm 1.67 cm/m   RA Area:     9.67 cm LA Vol (A2C): 24.4 ml 15.12 ml/m  RA Volume:   18.80 ml  11.65 ml/m LA Vol (A4C): 18.3 ml 11.34 ml/m  AORTIC VALVE AV Area (Vmax):    1.95 cm AV Area (Vmean):   1.95 cm AV Area  (VTI):     1.90 cm AV Vmax:           192.25 cm/s AV Vmean:          137.500 cm/s AV VTI:            0.371 m AV Peak Grad:      14.8 mmHg AV Mean Grad:      8.5 mmHg LVOT Vmax:         179.00 cm/s LVOT Vmean:        127.750 cm/s LVOT VTI:          0.336 m LVOT/AV VTI ratio: 0.91  AORTA Ao Root diam: 2.20 cm Ao Asc diam:  3.10 cm MITRAL VALVE MV Area (PHT): 2.34 cm    SHUNTS MV Decel Time: 324 msec    Systemic VTI:  0.34 m MV E velocity: 73.70 cm/s  Systemic Diam: 1.63 cm MV A velocity: 99.80 cm/s MV E/A ratio:  0.74 Bridgette Christopher MD Electronically signed by Shelda Bruckner MD Signature Date/Time: 03/22/2024/7:52:48 PM    Final    CT Head Wo Contrast Result Date: 03/18/2024 EXAM: CT HEAD WITHOUT 03/18/2024 06:52:20 PM TECHNIQUE: CT of the head was performed without the administration of intravenous contrast. Automated exposure control, iterative reconstruction, and/or weight based adjustment of the mA/kV was utilized to reduce the radiation dose to as low as reasonably achievable. COMPARISON: 03/01/2019 CLINICAL HISTORY: Mental status change, unknown cause. FINDINGS: BRAIN AND VENTRICLES: No acute intracranial hemorrhage. No mass effect or midline shift. No extra-axial fluid collection. No evidence of acute infarct. No hydrocephalus. ORBITS: No acute abnormality. SINUSES AND MASTOIDS: No acute abnormality. SOFT TISSUES AND SKULL: No acute skull fracture. No acute soft tissue abnormality. IMPRESSION: 1. No acute intracranial abnormality. Electronically signed by: Franky Crease MD 03/18/2024 07:05 PM EST RP Workstation: [id number]   DG Chest Port 1 View Result Date: 03/18/2024 EXAM: 1 VIEW(S) XRAY OF THE CHEST 03/18/2024 04:50:00 PM COMPARISON: CT chest 10/23/2023. CLINICAL HISTORY: Questionable sepsis - evaluate for abnormality. FINDINGS: LUNGS AND PLEURA: Lungs are clear. No pleural effusion. No pneumothorax. HEART AND MEDIASTINUM: Prominent mediastinal shadow corresponding to prominent mediastinal  fat on prior CT. BONES AND SOFT TISSUES: No acute osseous abnormality. IMPRESSION: 1. No acute cardiopulmonary findings. Electronically signed by: Elsie Gravely MD 03/18/2024 05:04 PM EST RP Workstation: [id number]     .Elspeth KYM Schultze, M.D., F.A.C.S. Gastrointestinal and Minimally Invasive Surgery [facility] [address], [address] [place], [place] [address] [phone] Main / Paging  03/28/2024 7:59 AM    Elspeth JAYSON Schultze      [1]  Allergies Allergen Reactions   Bacid Other (See Comments)    Unknown    Latex Other (See Comments)    Exact reaction not cited   Norvasc  [Amlodipine ] Swelling

## 2024-03-28 NOTE — Plan of Care (Signed)
   Problem: Education: Goal: Knowledge of General Education information will improve Description Including pain rating scale, medication(s)/side effects and non-pharmacologic comfort measures Outcome: Progressing   Problem: Health Behavior/Discharge Planning: Goal: Ability to manage health-related needs will improve Outcome: Progressing

## 2024-03-28 NOTE — Anesthesia Postprocedure Evaluation (Signed)
"   Anesthesia Post Note  Patient: Misty Cervantes  Procedure(s) Performed: LAPAROSCOPIC PROXIMAL RIGHT COLECTOMY,     Patient location during evaluation: PACU Anesthesia Type: General Level of consciousness: awake and alert Pain management: pain level controlled Vital Signs Assessment: post-procedure vital signs reviewed and stable Respiratory status: spontaneous breathing, nonlabored ventilation, respiratory function stable and patient connected to nasal cannula oxygen Cardiovascular status: blood pressure returned to baseline and stable Postop Assessment: no apparent nausea or vomiting Anesthetic complications: no   No notable events documented.  Last Vitals:  Vitals:   03/28/24 1330 03/28/24 1423  BP: (!) 145/67 (!) 147/71  Pulse: 81 88  Resp: 18 18  Temp: 36.7 C 37 C  SpO2: 100% 97%    Last Pain:  Vitals:   03/28/24 1423  TempSrc: Oral  PainSc:                  [name]      "

## 2024-03-28 NOTE — Discharge Instructions (Signed)
 SURGERY: POST OP INSTRUCTIONS (Surgery for small bowel obstruction, colon resection, etc)   ######################################################################  EAT Gradually transition to a high fiber diet with a fiber supplement over the next few days after discharge  WALK Walk an hour a day.  Control your pain to do that.    CONTROL PAIN Control pain so that you can walk, sleep, tolerate sneezing/coughing, go up/down stairs.  HAVE A BOWEL MOVEMENT DAILY Keep your bowels regular to avoid problems.  OK to try a laxative to override constipation.  OK to use an antidairrheal to slow down diarrhea.  Call if not better after 2 tries  CALL IF YOU HAVE PROBLEMS/CONCERNS Call if you are still struggling despite following these instructions. Call if you have concerns not answered by these instructions  ######################################################################   DIET Follow a light diet the first few days at home.  Start with a bland diet such as soups, liquids, starchy foods, low fat foods, etc.  If you feel full, bloated, or constipated, stay on a ful liquid or pureed/blenderized diet for a few days until you feel better and no longer constipated. Be sure to drink plenty of fluids every day to avoid getting dehydrated (feeling dizzy, not urinating, etc.). Gradually add a fiber supplement to your diet over the next week.  Gradually get back to a regular solid diet.  Avoid fast food or heavy meals the first week as you are more likely to get nauseated. It is expected for your digestive tract to need a few months to get back to normal.  It is common for your bowel movements and stools to be irregular.  You will have occasional bloating and cramping that should eventually fade away.  Until you are eating solid food normally, off all pain medications, and back to regular activities; your bowels will not be normal. Focus on eating a low-fat, high fiber diet the rest of your life  (See Getting to Good Bowel Health, below).  CARE of your INCISION or WOUND  It is good for closed incisions and even open wounds to be washed every day.  Shower every day.  Short baths are fine.  Wash the incisions and wounds clean with soap & water.    You may leave closed incisions open to air if it is dry.   You may cover the incision with clean gauze & replace it after your daily shower for comfort.  TEGADERM:  You have clear gauze band-aid dressings over your closed incision(s).  Remove the dressings 2 days after surgery = 1/16 Thursday.    If you have an open wound with a wound vac, see wound vac care instructions.    ACTIVITIES as tolerated Start light daily activities --- self-care, walking, climbing stairs-- beginning the day after surgery.  Gradually increase activities as tolerated.  Control your pain to be active.  Stop when you are tired.  Ideally, walk several times a day, eventually an hour a day.   Most people are back to most day-to-day activities in a few weeks.  It takes 4-8 weeks to get back to unrestricted, intense activity. If you can walk 30 minutes without difficulty, it is safe to try more intense activity such as jogging, treadmill, bicycling, low-impact aerobics, swimming, etc. Save the most intensive and strenuous activity for last (Usually 4-8 weeks after surgery) such as sit-ups, heavy lifting, contact sports, etc.  Refrain from any intense heavy lifting or straining until you are off narcotics for pain control.  You will have  off days, but things should improve week-by-week. DO NOT PUSH THROUGH PAIN.  Let pain be your guide: If it hurts to do something, don't do it.  Pain is your body warning you to avoid that activity for another week until the pain goes down. You may drive when you are no longer taking narcotic prescription pain medication, you can comfortably wear a seatbelt, and you can safely make sudden turns/stops to protect yourself without hesitating due  to pain. You may have sexual intercourse when it is comfortable. If it hurts to do something, stop.   MEDICATIONS Take your usually prescribed home medications unless otherwise directed.    Blood thinners:  You can restart any strong blood thinners after the second postoperative day  for example: COUMADIN (warfarin), XERELTO (rivaroxaban ), ELIQUIS (apixaban), PLAVIX (clopidigrel), BRILINTA (ticagrelor), EFFIENT (prasugrel), PRADAXA (dabigatran), etc  Continue aspirin  before & after surgery..     Some oozing/bleeding the first 1-2 weeks is common but should taper down & be small volume.    If you are passing many large clots or having uncontrolling bleeding, call your surgeon    PAIN CONTROL Pain after surgery or related to activity is often due to strain/injury to muscle, tendon, nerves and/or incisions.  This pain is usually short-term and will improve in a few months.  To help speed the process of healing and to get back to regular activity more quickly, DO THE FOLLOWING THINGS TOGETHER: Increase activity gradually.  DO NOT PUSH THROUGH PAIN Use Ice and/or Heat Try Gentle Massage and/or Stretching Take over the counter pain medication Take Narcotic prescription pain medication for more severe pain  Good pain control = faster recovery.  It is better to take more medicine to be more active than to stay in bed all day to avoid medications.  Increase activity gradually Avoid heavy lifting at first, then increase to lifting as tolerated over the next 6 weeks. Do not push through the pain.  Listen to your body and avoid positions and maneuvers than reproduce the pain.  Wait a few days before trying something more intense Walking an hour a day is encouraged to help your body recover faster and more safely.  Start slowly and stop when getting sore.  If you can walk 30 minutes without stopping or pain, you can try more intense activity (running, jogging, aerobics, cycling, swimming,  treadmill, sex, sports, weightlifting, etc.) Remember: If it hurts to do it, then dont do it! Use Ice and/or Heat You will have swelling and bruising around the incisions.  This will take several weeks to resolve. Ice packs or heating pads (6-8 times a day, 30-60 minutes at a time) will help sooth soreness & bruising. Some people prefer to use ice alone, heat alone, or alternate between ice & heat.  Experiment and see what works best for you.  Consider trying ice for the first few days to help decrease swelling and bruising; then, switch to heat to help relax sore spots and speed recovery. Shower every day.  Short baths are fine.  It feels good!  Keep the incisions and wounds clean with soap & water.   Try Gentle Massage and/or Stretching Massage at the area of pain many times a day Stop if you feel pain - do not overdo it Take over the counter pain medication This helps the muscle and nerve tissues become less irritable and calm down faster Choose ONE of the following over-the-counter anti-inflammatory medications: Acetaminophen  500mg  tabs (Tylenol ) 1-2 pills with every  meal and just before bedtime (avoid if you have liver problems or if you have acetaminophen  in you narcotic prescription) Naproxen 220mg  tabs (ex. Aleve, Naprosyn) 1-2 pills twice a day (avoid if you have kidney, stomach, IBD, or bleeding problems) Ibuprofen 200mg  tabs (ex. Advil, Motrin) 3-4 pills with every meal and just before bedtime (avoid if you have kidney, stomach, IBD, or bleeding problems) Take with food/snack several times a day as directed for at least 2 weeks to help keep pain / soreness down & more manageable. Take Narcotic prescription pain medication for more severe pain A prescription for strong pain control is often given to you upon discharge (for example: oxycodone /Percocet, hydrocodone /Norco/Vicodin, or tramadol /Ultram ) Take your pain medication as prescribed. Be mindful that most narcotic prescriptions  contain Tylenol  (acetaminophen ) as well - avoid taking too much Tylenol . If you are having problems/concerns with the prescription medicine (does not control pain, nausea, vomiting, rash, itching, etc.), please call us  [phone] to see if we need to switch you to a different pain medicine that will work better for you and/or control your side effects better. If you need a refill on your pain medication, you must call the office before 4 pm and on weekdays only.  By federal law, prescriptions for narcotics cannot be called into a pharmacy.  They must be filled out on paper & picked up from our office by the patient or authorized caretaker.  Prescriptions cannot be filled after 4 pm nor on weekends.    WHEN TO CALL US  [phone] Severe uncontrolled or worsening pain  Fever over 101 F (38.5 C) Concerns with the incision: Worsening pain, redness, rash/hives, swelling, bleeding, or drainage Reactions / problems with new medications (itching, rash, hives, nausea, etc.) Nausea and/or vomiting Difficulty urinating Difficulty breathing Worsening fatigue, dizziness, lightheadedness, blurred vision Other concerns If you are not getting better after two weeks or are noticing you are getting worse, contact our office [phone] for further advice.  We may need to adjust your medications, re-evaluate you in the office, send you to the emergency room, or see what other things we can do to help. The clinic staff is available to answer your questions during regular business hours (8:30am-5pm).  Please dont hesitate to call and ask to speak to one of our nurses for clinical concerns.    A surgeon from [facility] is always on call at the hospitals 24 hours/day If you have a medical emergency, go to the nearest emergency room or call 911.  FOLLOW UP in our office One the day of your discharge from the hospital (or the next business weekday), please call [facility] to set up  or confirm an appointment to see your surgeon in the office for a follow-up appointment.  Usually it is 2-3 weeks after your surgery.   If you have skin staples at your incision(s), let the office know so we can set up a time in the office for the nurse to remove them (usually around 10 days after surgery). Make sure that you call for appointments the day of discharge (or the next business weekday) from the hospital to ensure a convenient appointment time. IF YOU HAVE DISABILITY OR FAMILY LEAVE FORMS, BRING THEM TO THE OFFICE FOR PROCESSING.  DO NOT GIVE THEM TO YOUR DOCTOR.  [facility] [address], [place], [place]  [address] ? [phone] - Main [phone] - Toll Free,  [phone] - Fax [email or web address]  GETTING TO GOOD BOWEL HEALTH. It is expected for your digestive tract to need a few months to get back to normal.  It is common for your bowel movements and stools to be irregular.  You will have occasional bloating and cramping that should eventually fade away.  Until you are eating solid food normally, off all pain medications, and back to regular activities; your bowels will not be normal.   Avoiding constipation The goal: ONE SOFT BOWEL MOVEMENT A DAY!    Drink plenty of fluids.  Choose water first. TAKE A FIBER SUPPLEMENT EVERY DAY THE REST OF YOUR LIFE During your first week back home, gradually add back a fiber supplement every day Experiment which form you can tolerate.   There are many forms such as powders, tablets, wafers, gummies, etc Psyllium bran (Metamucil), methylcellulose (Citrucel), Miralax  or Glycolax , Benefiber, Flax Seed.  Adjust the dose week-by-week (1/2 dose/day to 6 doses a day) until you are moving your bowels 1-2 times a day.  Cut back the dose or try a different fiber product if it is giving you problems such as diarrhea or bloating. Sometimes a laxative is needed to help jump-start bowels if  constipated until the fiber supplement can help regulate your bowels.  If you are tolerating eating & you are farting, it is okay to try a gentle laxative such as double dose MiraLax , prune juice, or Milk of Magnesia.  Avoid using laxatives too often. Stool softeners can sometimes help counteract the constipating effects of narcotic pain medicines.  It can also cause diarrhea, so avoid using for too long. If you are still constipated despite taking fiber daily, eating solids, and a few doses of laxatives, call our office. Controlling diarrhea Try drinking liquids and eating bland foods for a few days to avoid stressing your intestines further. Avoid dairy products (especially milk & ice cream) for a short time.  The intestines often can lose the ability to digest lactose when stressed. Avoid foods that cause gassiness or bloating.  Typical foods include beans and other legumes, cabbage, broccoli, and dairy foods.  Avoid greasy, spicy, fast foods.  Every person has some sensitivity to other foods, so listen to your body and avoid those foods that trigger problems for you. Probiotics (such as active yogurt, Align, etc) may help repopulate the intestines and colon with normal bacteria and calm down a sensitive digestive tract Adding a fiber supplement gradually can help thicken stools by absorbing excess fluid and retrain the intestines to act more normally.  Slowly increase the dose over a few weeks.  Too much fiber too soon can backfire and cause cramping & bloating. It is okay to try and slow down diarrhea with a few doses of antidiarrheal medicines.   Bismuth subsalicylate (ex. Kayopectate, Pepto Bismol) for a few doses can help control diarrhea.  Avoid if pregnant.   Loperamide (Imodium) can slow down diarrhea.  Start with one tablet (2mg ) first.  Avoid if you are having fevers or severe pain.  ILEOSTOMY PATIENTS WILL HAVE CHRONIC DIARRHEA since their colon is not in use.    Drink plenty of liquids.   You will need to drink even more glasses of water/liquid a day to avoid getting dehydrated. Record output from your ileostomy.  Expect to empty the bag every 3-4 hours at first.  Most people with a permanent ileostomy empty their bag 4-6 times at the least.   Use antidiarrheal medicine (especially Imodium) several times a day to avoid getting dehydrated.  Start with a dose at bedtime & breakfast.  Adjust up or down as needed.  Increase antidiarrheal medications as directed to avoid emptying the bag more than 8 times a day (every 3 hours). Work with your wound ostomy nurse to learn care for your ostomy.  See ostomy care instructions. TROUBLESHOOTING IRREGULAR BOWELS 1) Start with a soft & bland diet. No spicy, greasy, or fried foods.  2) Avoid gluten/wheat or dairy products from diet to see if symptoms improve. 3) Miralax  17gm or flax seed mixed in 8oz. water or juice-daily. May use 2-4 times a day as needed. 4) Gas-X, Phazyme, etc. as needed for gas & bloating.  5) Prilosec (omeprazole ) over-the-counter as needed 6)  Consider probiotics (Align, Activa, etc) to help calm the bowels down  Call your doctor if you are getting worse or not getting better.  Sometimes further testing (cultures, endoscopy, X-ray studies, CT scans, bloodwork, etc.) may be needed to help diagnose and treat the cause of the diarrhea. [facility] [address], [address], [place], [place]  [address] [phone] - Main.    [phone]  - Toll Free.   [phone] - Fax [email or web address]

## 2024-03-28 NOTE — Plan of Care (Signed)

## 2024-03-28 NOTE — Progress Notes (Addendum)
 Rounded on patient frequently since 1900 to see how she was doing on her bowel prep. She has only drank about 120 mls of prep. I emphasized the importance of drinking prep. Patient states she's unable to drink. I asked her if she is nauseous she stated yes, PRN Zofran  IV Zofran  was given. She states that she feels like she is being rushed to have surgery and she asked if she is going to die. Also, she asked why can't she go home. I reassured patient and told her that she can speak to the doctor in the morning about her concerns.

## 2024-03-29 ENCOUNTER — Encounter (HOSPITAL_COMMUNITY): Payer: Self-pay | Admitting: Surgery

## 2024-03-29 LAB — CBC
HCT: 24.5 % — ABNORMAL LOW (ref 36.0–46.0)
Hemoglobin: 7.4 g/dL — ABNORMAL LOW (ref 12.0–15.0)
MCH: 24.5 pg — ABNORMAL LOW (ref 26.0–34.0)
MCHC: 30.2 g/dL (ref 30.0–36.0)
MCV: 81.1 fL (ref 80.0–100.0)
Platelets: 488 K/uL — ABNORMAL HIGH (ref 150–400)
RBC: 3.02 MIL/uL — ABNORMAL LOW (ref 3.87–5.11)
RDW: 16.9 % — ABNORMAL HIGH (ref 11.5–15.5)
WBC: 12.4 K/uL — ABNORMAL HIGH (ref 4.0–10.5)
nRBC: 0 % (ref 0.0–0.2)

## 2024-03-29 LAB — GLUCOSE, CAPILLARY
Glucose-Capillary: 141 mg/dL — ABNORMAL HIGH (ref 70–99)
Glucose-Capillary: 144 mg/dL — ABNORMAL HIGH (ref 70–99)
Glucose-Capillary: 162 mg/dL — ABNORMAL HIGH (ref 70–99)
Glucose-Capillary: 189 mg/dL — ABNORMAL HIGH (ref 70–99)
Glucose-Capillary: 253 mg/dL — ABNORMAL HIGH (ref 70–99)
Glucose-Capillary: 256 mg/dL — ABNORMAL HIGH (ref 70–99)

## 2024-03-29 LAB — BASIC METABOLIC PANEL WITH GFR
Anion gap: 9 (ref 5–15)
BUN: 9 mg/dL (ref 8–23)
CO2: 19 mmol/L — ABNORMAL LOW (ref 22–32)
Calcium: 8.3 mg/dL — ABNORMAL LOW (ref 8.9–10.3)
Chloride: 110 mmol/L (ref 98–111)
Creatinine, Ser: 1.08 mg/dL — ABNORMAL HIGH (ref 0.44–1.00)
GFR, Estimated: 52 mL/min — ABNORMAL LOW
Glucose, Bld: 149 mg/dL — ABNORMAL HIGH (ref 70–99)
Potassium: 3.9 mmol/L (ref 3.5–5.1)
Sodium: 137 mmol/L (ref 135–145)

## 2024-03-29 LAB — MAGNESIUM: Magnesium: 1.9 mg/dL (ref 1.7–2.4)

## 2024-03-29 NOTE — Progress Notes (Signed)
 Physical Therapy Treatment Patient Details Name: Misty Cervantes MRN: [id number] DOB: [birth year] Today's Date: 03/29/2024   History of Present Illness Misty Cervantes is a 79 y.o. female who presented to the ED for evaluation of generalized weakness, nausea and confusion dizziness, fever, tachycardic, initiated sepsis protocol in ED. Dx- UTI/sepsis  Hospital course complicated by drop in hgb.  GI consulted and pt underwent colonoscopy on 1/10 showed potential tumor - awaiting biopsyPMH: r T2DM, HTN, HLD, osteoarthritis, CKD 3A, spinal stenosis and insomnia, THA, TKA    PT Comments  Pt making limited progress this session. Incontinent of stool in bed, pt with  little awareness of situation, decr safety awareness. Pt with decr memory, no recall of prior mobility while in hospital, states  this is the first time I have been up, I haven't moved at all  nursing staff states pt has poor memory, sometimes is Ox4 and sometimes not. Will continue to follow in acute setting. HHPT remains appropriate plan at this time.   If plan is discharge home, recommend the following: A little help with walking and/or transfers;Assist for transportation;A little help with bathing/dressing/bathroom;Assistance with cooking/housework   Can travel by private vehicle        Equipment Recommendations  None recommended by PT    Recommendations for Other Services       Precautions / Restrictions Precautions Precautions: Fall Recall of Precautions/Restrictions: Impaired Precaution/Restrictions Comments: pt cannot Restrictions Weight Bearing Restrictions Per Provider Order: No     Mobility  Bed Mobility   Bed Mobility: Rolling, Sidelying to Sit Rolling: Min assist Sidelying to sit: Min assist       General bed mobility comments: cues for log roll technique to minimize abd pain (pt c/o pain with movement, difficulty mobilizing). assist to elevate trunk    Transfers Overall transfer level: Needs  assistance Equipment used: Rolling walker (2 wheels) Transfers: Sit to/from Stand Sit to Stand: Min assist           General transfer comment: assist to rise and stabilize, cues for hand placement and safety    Ambulation/Gait Ambulation/Gait assistance: Contact guard assist Gait Distance (Feet): 15 Feet Assistive device: Rolling walker (2 wheels) Gait Pattern/deviations: Step-through pattern, Trunk flexed       General Gait Details: CGA for safety, pt unsteady upon standing and reliant on RW for balance. distance limtied d/t stool incontinence   Stairs             Wheelchair Mobility     Tilt Bed    Modified Rankin (Stroke Patients Only)       Balance   Sitting-balance support: Feet supported, No upper extremity supported Sitting balance-Leahy Scale: Fair     Standing balance support: During functional activity, Bilateral upper extremity supported, No upper extremity supported Standing balance-Leahy Scale: Fair (able to briefly static stand without UE support. reliant on device for amb)                              Communication Communication Communication: No apparent difficulties  Cognition Arousal: Alert (sleepy) Behavior During Therapy: Flat affect   PT - Cognitive impairments: Memory                       PT - Cognition Comments: pt with no recall of prior PT sessions, unable to verbalize log roll technique to decr abd pain. unaware of incontinence of stool in the bed  Following commands: Intact      Cueing Cueing Techniques: Verbal cues, Tactile cues  Exercises      General Comments        Pertinent Vitals/Pain      Home Living                          Prior Function            PT Goals (current goals can now be found in the care plan section) Acute Rehab PT Goals Patient Stated Goal: get stronger and go home PT Goal Formulation: With patient Time For Goal Achievement: 04/03/24 Potential to  Achieve Goals: Good Progress towards PT goals: Progressing toward goals    Frequency    Min 2X/week      PT Plan      Co-evaluation              AM-PAC PT 6 Clicks Mobility   Outcome Measure  Help needed turning from your back to your side while in a flat bed without using bedrails?: A Little Help needed moving from lying on your back to sitting on the side of a flat bed without using bedrails?: A Little Help needed moving to and from a bed to a chair (including a wheelchair)?: A Little Help needed standing up from a chair using your arms (e.g., wheelchair or bedside chair)?: A Little Help needed to walk in hospital room?: A Little Help needed climbing 3-5 steps with a railing? : A Little 6 Click Score: 18    End of Session Equipment Utilized During Treatment: Gait belt Activity Tolerance: Patient tolerated treatment well Patient left: Other (comment);with call bell/phone within reach;with nursing/sitter in room (in bathroom) Nurse Communication: Mobility status PT Visit Diagnosis: Muscle weakness (generalized) (M62.81);Unsteadiness on feet (R26.81)     Time: 8775-8759 PT Time Calculation (min) (ACUTE ONLY): 16 min  Charges:    $Gait Training: 8-22 mins PT General Charges $$ ACUTE PT VISIT: 1 Visit                     Misheel Gowans, PT  Acute Rehab Dept [facility]) [phone]  03/29/2024    [facility] 03/29/2024, 1:12 PM

## 2024-03-29 NOTE — Progress Notes (Signed)
 Pharmacy Brief Note - Alvimopan  (Entereg )  The standing order set for alvimopan  (Entereg ) now includes an automatic order to discontinue the drug after the patient has had a bowel movement. The change was approved by the Pharmacy & Therapeutics Committee and the Medical Executive Committee.   This patient has had bowel movements documented by nursing. Therefore, alvimopan  has been discontinued. If there are questions, please contact the pharmacy at [phone].   Thank you-   Dolphus Roller, PharmD, BCPS 03/29/2024 11:45 AM

## 2024-03-29 NOTE — Progress Notes (Signed)
 " PROGRESS NOTE  Misty Cervantes  DOB: [birth year]  PCP: Lucius Krabbe, NP FMW:[id number]  DOA: 03/18/2024  LOS: 11 days  Hospital Day: 12  Subjective: Patient was seen and examined this morning. Lying on bed.  Not in distress Reports 1 bowel movement last night. Allowed for clear liquid diet by general surgery today Afebrile, hemodynamically stable, breathing on room air Blood glucose level 149 this morning Foley catheter was removed this morning for voiding trial  Brief narrative: Misty Cervantes is a 79 y.o. female with PMH significant for T2DM, HTN, HLD, CKD 3a, dysphagia, osteoarthritis, h/o gastric ulcers 1/4, patient presented to ED with complaint of weakness, nausea, and confusion.   She was admitted for sepsis secondary to UTI.  Urine culture and blood culture grew E. coli.  Treated with antibiotics.  Her hospital course was complicated by acute drop in hemoglobin.  1/10, underwent colonoscopy which showed potential malignant tumor at the ileocecal valve.  Biopsy resulted to show invasive colorectal adenocarcinoma 1/14, underwent laparoscopic proximal right colectomy with anastomosis See below for details  Assessment and plan: Invasive colorectal adenocarcinoma 1/10, underwent colonoscopy for evaluation of anemia.  It showed potential malignant tumor at the ileocecal valve.   1/10, CT scan did not show any cecal mass, only suggested right-sided colitis infectious versus inflammatory. 1/13, biopsy resulted to show invasive colorectal adenocarcinoma.  General surgery was consulted 1/14, underwent laparoscopic proximal right colectomy with anastomosis 1/15, reports 1 bowel movement last night currently postop ileus resolving.  Allowed for clear liquid diet by general surgery today Continue IV hydration with NS at 75 mL/h..   Continue to monitor electrolytes   Sepsis secondary to UTI  E coli bacteremia and UTI Primarily admitted for sepsis secondary to UTI Blood  culture as well as urine culture grew E. Coli. Repeat blood culture sent on 1/6 has not shown any growth so far. Initially improved with IV Rocephin  and later transitioned to oral Bactrim .  Completed 7-day course of antibiotics on 1/10. Recent Labs  Lab 03/24/24 0520 03/25/24 0503 03/27/24 1219 03/28/24 0458 03/29/24 0459  WBC 13.6* 9.7 6.3 7.4 12.4*   Acute blood loss anemia  d/t colon cancer  chronic anemia Baseline hemoglobin from 2023 was 11.7. Hemoglobin at the lowest of 6.3 on 1/5.  Anemia panel showed low ferritin and low iron  level.  She received 1 unit of blood on 1/5 Hemoglobin has been stable since then 1/10, colonoscopy was done and ultimately diagnosed to have colorectal cancer as discussed above.  Recent Labs    03/19/24 0431 03/19/24 1631 03/24/24 0520 03/25/24 0503 03/27/24 1219 03/28/24 0458 03/29/24 0459  HGB  --    < > 7.5* 7.5* 7.5* 7.6* 7.4*  MCV  --    < > 79.6* 80.2 81.4 80.7 81.1  VITAMINB12 2,598*  --   --   --   --   --   --   FERRITIN 52  --   --   --   --   --   --   TIBC 357  --   --   --   --   --   --   IRON  <10*  --   --   --   --   --   --    < > = values in this interval not displayed.   Acute multiple encephalopathy  MCI (mild cognitive impairment) Was confused at presentation likely due to UTI.  Family also has concerns about developing dementia  at home. Had episodes of sundowning in the hospital. Mental status gradually improved to normal.  Now able to have meaningful conversation. Continue delirium precautions Outpatient neurology follow-up.  Type 2 diabetes mellitus uncontrolled with hyperglycemia A1c 10.7 on 02/28/2024 PTA meds-glipizide  10 mg BID, metformin  500 mg BID, Jardiance  Diabetes care coordinator consult appreciated Oral meds on hold.   Currently on SSI with Accu-Cheks q 4hrs. Recent Labs  Lab 03/28/24 1659 03/28/24 2021 03/29/24 0031 03/29/24 0434 03/29/24 1137  GLUCAP 264* 299* 189* 144* 253*   HTN Blood  pressure currently controlled on losartan  50 mg daily. losartan  on hold perioperatively.   Continue to monitor blood pressure.  IV hydralazine  as needed  HLD Continue simvastatin  CKD 3A Creatinine stable at baseline Recent Labs  Lab 03/23/24 0519 03/24/24 0520 03/25/24 0503 03/28/24 0458 03/29/24 0459  NA 133* 136 137 136 137  K 4.3 3.6 3.5 3.7 3.9  CL 101 103 105 103 110  CO2 20* 20* 21* 18* 19*  GLUCOSE 225* 212* 70 244* 149*  BUN 18 14 9 11 9   CREATININE 1.17* 1.15* 1.18* 0.99 1.08*  CALCIUM  8.5* 8.4* 8.5* 8.7* 8.3*  MG  --   --   --   --  1.9   Generalized weakness In the setting of acute illness and dehydration PT/OT consulted.  Home health recommended. Fall precautions  Systolic ejection murmur Likely age-related aortic sclerosis, confirmed with echocardiogram 1/8. No need of further workup   Nutrition Status:         Mobility:   PT Orders: Active   PT Follow up Rec: Home Health Pt1/15/2026 1200    Goals of care   Code Status: Full Code     DVT prophylaxis:  enoxaparin  (LOVENOX ) injection 40 mg Start: 03/29/24 0800 SCD's Start: 03/28/24 1226 Place and maintain sequential compression device Start: 03/19/24 1332   Antimicrobials: Completed the course of Bactrim  Fluid: None Consultants: GI Family Communication: Family not at bedside.  Status: Inpatient Level of care:  Med-Surg   Patient is from: Home Needs to continue in-hospital care: POD 1 Anticipated d/c to: Ongoing workup.  Ultimately home with home health PT.  Hopefully in 1-2 days.    Diet:  Diet Order             Diet full liquid Room service appropriate? Yes; Fluid consistency: Thin  Diet effective now                   Scheduled Meds:  acetaminophen   1,000 mg Oral Q6H   enoxaparin  (LOVENOX ) injection  40 mg Subcutaneous Q24H   feeding supplement  237 mL Oral BID BM   gabapentin   200 mg Oral QHS   insulin  aspart  0-9 Units Subcutaneous Q4H   ondansetron  (ZOFRAN )  IV  2 mg Intravenous Once   pantoprazole   40 mg Oral Daily   polycarbophil  625 mg Oral BID   sodium chloride  flush  3 mL Intravenous Q12H    PRN meds: sodium chloride , sodium chloride , acetaminophen  **OR** acetaminophen , alum & mag hydroxide-simeth, alum & mag hydroxide-simeth, bisacodyl , hydrALAZINE , HYDROmorphone  (DILAUDID ) injection, hydrOXYzine , magic mouthwash, melatonin, menthol , metoprolol  tartrate, naphazoline-glycerin , ondansetron  (ZOFRAN ) IV, oxyCODONE , phenol, prochlorperazine  **OR** prochlorperazine , senna-docusate, simethicone , simethicone , sodium chloride , sodium chloride  flush   Infusions:   sodium chloride      sodium chloride      sodium chloride  Stopped (03/29/24 1158)      Antimicrobials: Anti-infectives (From admission, onward)    Start     Dose/Rate Route  Frequency Ordered Stop   03/28/24 2000  cefoTEtan  (CEFOTAN ) 2 g in sodium chloride  0.9 % 100 mL IVPB        2 g 200 mL/hr over 30 Minutes Intravenous Every 12 hours 03/28/24 1226 03/28/24 2020   03/28/24 0600  cefoTEtan  (CEFOTAN ) 2 g in sodium chloride  0.9 % 100 mL IVPB        2 g 200 mL/hr over 30 Minutes Intravenous On call to O.R. 03/27/24 1131 03/28/24 0923   03/27/24 1400  neomycin  (MYCIFRADIN ) tablet 1,000 mg       Placed in And Linked Group   1,000 mg Oral 3 times per day 03/27/24 1131 03/27/24 2236   03/27/24 1400  metroNIDAZOLE  (FLAGYL ) tablet 1,000 mg       Placed in And Linked Group   1,000 mg Oral 3 times per day 03/27/24 1131 03/27/24 2234   03/21/24 2200  sulfamethoxazole -trimethoprim  (BACTRIM  DS) 800-160 MG per tablet 2 tablet        2 tablet Oral Every 12 hours 03/21/24 1448 03/24/24 2121   03/20/24 0600  cefTRIAXone  (ROCEPHIN ) 2 g in sodium chloride  0.9 % 100 mL IVPB  Status:  Discontinued        2 g 200 mL/hr over 30 Minutes Intravenous Every 24 hours 03/19/24 0717 03/21/24 1448   03/19/24 0815  cefTRIAXone  (ROCEPHIN ) 1 g in sodium chloride  0.9 % 100 mL IVPB        1 g 200 mL/hr over  30 Minutes Intravenous  Once 03/19/24 0717 03/19/24 0930   03/19/24 0500  cefTRIAXone  (ROCEPHIN ) 1 g in sodium chloride  0.9 % 100 mL IVPB  Status:  Discontinued        1 g 200 mL/hr over 30 Minutes Intravenous Every 24 hours 03/18/24 2117 03/19/24 0717   03/18/24 1715  vancomycin  (VANCOREADY) IVPB 1500 mg/300 mL        1,500 mg 150 mL/hr over 120 Minutes Intravenous  Once 03/18/24 1642 03/18/24 2011   03/18/24 1645  ceFEPIme  (MAXIPIME ) 2 g in sodium chloride  0.9 % 100 mL IVPB        2 g 200 mL/hr over 30 Minutes Intravenous  Once 03/18/24 1640 03/18/24 1800   03/18/24 1645  metroNIDAZOLE  (FLAGYL ) IVPB 500 mg        500 mg 100 mL/hr over 60 Minutes Intravenous  Once 03/18/24 1640 03/18/24 1831   03/18/24 1645  vancomycin  (VANCOCIN ) IVPB 1000 mg/200 mL premix  Status:  Discontinued        1,000 mg 200 mL/hr over 60 Minutes Intravenous  Once 03/18/24 1640 03/18/24 1642       Objective: Vitals:   03/29/24 0944 03/29/24 1326  BP: 104/60 120/68  Pulse: 88 85  Resp: 18 18  Temp: 98.8 F (37.1 C) 97.6 F (36.4 C)  SpO2: 96% 99%    Intake/Output Summary (Last 24 hours) at 03/29/2024 1426 Last data filed at 03/29/2024 1400 Gross per 24 hour  Intake 2254.17 ml  Output 800 ml  Net 1454.17 ml   Filed Weights   03/18/24 1615 03/28/24 0748 03/29/24 0605  Weight: 66.2 kg 66 kg 66 kg   Weight change:  Body mass index is 29.39 kg/m.   Physical Exam: General exam: Pleasant, elderly Caucasian female.  Not in distress.  Pain controlled. Skin: No rashes, lesions or ulcers. HEENT: Atraumatic, normocephalic, no obvious bleeding Lungs: Clear to auscultation bilaterally,  CVS: S1, S2, systolic ejection murmur GI/Abd: Soft, appropriate postop tenderness, nondistended, bowel sound slow but present.  CNS: Alert, awake, ordered x 3 Extremities: No pedal edema, no calf tenderness,   Data Review: I have personally reviewed the laboratory data and studies available.  F/u labs  ordered Unresulted Labs (From admission, onward)     Start     Ordered   04/04/24 0500  Creatinine, serum  (enoxaparin  (LOVENOX )  CrCl >/= 30 mL/min  )  Weekly,   R     Comments: while on enoxaparin  therapy.    03/28/24 1226            Signed, Chapman Rota, MD [facility] 03/29/2024  "

## 2024-03-29 NOTE — Progress Notes (Signed)
 "   1 Day Post-Op  Subjective: Tolerating clear liquids with no nausea.  Had a small BM overnight.  Foley has been DC recently, not voided yet.  Not mobilized yet.    Objective: Vital signs in last 24 hours: Temp:  [97.6 F (36.4 C)-99.1 F (37.3 C)] 98.8 F (37.1 C) (01/15 0944) Pulse Rate:  [79-100] 88 (01/15 0944) Resp:  [16-18] 18 (01/15 0944) BP: (102-148)/(50-72) 104/60 (01/15 0944) SpO2:  [91 %-100 %] 96 % (01/15 0944) Weight:  [66 kg] 66 kg (01/15 0605) Last BM Date : 03/29/24  Intake/Output from previous day: 01/14 0701 - 01/15 0700 In: 2181.7 [P.O.:480; I.V.:1490.9; IV Piggyback:210.8] Out: 820 [Urine:800; Blood:20] Intake/Output this shift: Total I/O In: 525 [I.V.:525] Out: 0   PE: Abd: soft, appropriately tender, incisions c/d/I with honeycomb over pfannenstiel and gauze and tegaderm over lap sites.  ND  Lab Results:  Recent Labs    03/28/24 0458 03/29/24 0459  WBC 7.4 12.4*  HGB 7.6* 7.4*  HCT 26.0* 24.5*  PLT 478* 488*   BMET Recent Labs    03/28/24 0458 03/29/24 0459  NA 136 137  K 3.7 3.9  CL 103 110  CO2 18* 19*  GLUCOSE 244* 149*  BUN 11 9  CREATININE 0.99 1.08*  CALCIUM  8.7* 8.3*   PT/INR No results for input(s): LABPROT, INR in the last 72 hours. CMP     Component Value Date/Time   NA 137 03/29/2024 0459   NA 137 08/25/2015 0955   K 3.9 03/29/2024 0459   CL 110 03/29/2024 0459   CO2 19 (L) 03/29/2024 0459   GLUCOSE 149 (H) 03/29/2024 0459   BUN 9 03/29/2024 0459   BUN 23 08/25/2015 0955   CREATININE 1.08 (H) 03/29/2024 0459   CREATININE 1.23 (H) 02/13/2020 1124   CALCIUM  8.3 (L) 03/29/2024 0459   PROT 7.3 03/18/2024 1708   PROT 6.8 08/25/2015 0955   ALBUMIN 4.0 03/18/2024 1708   ALBUMIN 4.4 08/25/2015 0955   AST 17 03/18/2024 1708   ALT 11 03/18/2024 1708   ALKPHOS 106 03/18/2024 1708   BILITOT 0.7 03/18/2024 1708   BILITOT 0.7 08/25/2015 0955   GFRNONAA 52 (L) 03/29/2024 0459   GFRNONAA 43 (L) 02/13/2020 1124    GFRAA 50 (L) 02/13/2020 1124   Lipase     Component Value Date/Time   LIPASE 45 08/22/2021 1540       Studies/Results: No results found.  Anti-infectives: Anti-infectives (From admission, onward)    Start     Dose/Rate Route Frequency Ordered Stop   03/28/24 2000  cefoTEtan  (CEFOTAN ) 2 g in sodium chloride  0.9 % 100 mL IVPB        2 g 200 mL/hr over 30 Minutes Intravenous Every 12 hours 03/28/24 1226 03/28/24 2020   03/28/24 0600  cefoTEtan  (CEFOTAN ) 2 g in sodium chloride  0.9 % 100 mL IVPB        2 g 200 mL/hr over 30 Minutes Intravenous On call to O.R. 03/27/24 1131 03/28/24 0923   03/27/24 1400  neomycin  (MYCIFRADIN ) tablet 1,000 mg       Placed in And Linked Group   1,000 mg Oral 3 times per day 03/27/24 1131 03/27/24 2236   03/27/24 1400  metroNIDAZOLE  (FLAGYL ) tablet 1,000 mg       Placed in And Linked Group   1,000 mg Oral 3 times per day 03/27/24 1131 03/27/24 2234   03/21/24 2200  sulfamethoxazole -trimethoprim  (BACTRIM  DS) 800-160 MG per tablet 2 tablet  2 tablet Oral Every 12 hours 03/21/24 1448 03/24/24 2121   03/20/24 0600  cefTRIAXone  (ROCEPHIN ) 2 g in sodium chloride  0.9 % 100 mL IVPB  Status:  Discontinued        2 g 200 mL/hr over 30 Minutes Intravenous Every 24 hours 03/19/24 0717 03/21/24 1448   03/19/24 0815  cefTRIAXone  (ROCEPHIN ) 1 g in sodium chloride  0.9 % 100 mL IVPB        1 g 200 mL/hr over 30 Minutes Intravenous  Once 03/19/24 0717 03/19/24 0930   03/19/24 0500  cefTRIAXone  (ROCEPHIN ) 1 g in sodium chloride  0.9 % 100 mL IVPB  Status:  Discontinued        1 g 200 mL/hr over 30 Minutes Intravenous Every 24 hours 03/18/24 2117 03/19/24 0717   03/18/24 1715  vancomycin  (VANCOREADY) IVPB 1500 mg/300 mL        1,500 mg 150 mL/hr over 120 Minutes Intravenous  Once 03/18/24 1642 03/18/24 2011   03/18/24 1645  ceFEPIme  (MAXIPIME ) 2 g in sodium chloride  0.9 % 100 mL IVPB        2 g 200 mL/hr over 30 Minutes Intravenous  Once 03/18/24 1640  03/18/24 1800   03/18/24 1645  metroNIDAZOLE  (FLAGYL ) IVPB 500 mg        500 mg 100 mL/hr over 60 Minutes Intravenous  Once 03/18/24 1640 03/18/24 1831   03/18/24 1645  vancomycin  (VANCOCIN ) IVPB 1000 mg/200 mL premix  Status:  Discontinued        1,000 mg 200 mL/hr over 60 Minutes Intravenous  Once 03/18/24 1640 03/18/24 1642        Assessment/Plan POD 1, s/p lap assisted R colectomy for adenocarcinoma, Dr. Sheldon, 1/14 -path pending -had a small BM overnight so entereg  has been DC -CLD, adv to FLD today per orders -foley out today -mobilize, pulm toilet  -labs reviewed and stable  FEN - FLD, Ensure, SLIV VTE - Lovenox  ID - none currently  Recent UTI with E coli bacteremia Anemia - stable today following OR DM HTN HLD CKD   LOS: 11 days    Burnard FORBES Banter , [facility] 03/29/2024, 12:03 PM Please see Amion for pager number during day hours 7:00am-4:30pm or 7:00am -11:30am on weekends  "

## 2024-03-29 NOTE — TOC Progression Note (Signed)
 Transition of Care [facility]) - Progression Note    Patient Details  Name: Misty Cervantes MRN: [id number] Date of Birth: September 08, 1945  Transition of Care [facility]) CM/SW Contact  NORMAN ASPEN, [place] Phone Number: 03/29/2024, 10:16 AM  Clinical Narrative:     IP CM continuing to monitor for any additional dc needs.  Note that HHPT already arranged with [facility].    Expected Discharge Plan: Home w Home Health Services Barriers to Discharge: Continued Medical Work up               Expected Discharge Plan and Services In-house Referral: Clinical Social Work   Post Acute Care Choice: Home Health Living arrangements for the past 2 months: Apartment                 DME Arranged: N/A DME Agency: NA       HH Arranged: PT HH Agency: [facility] Date HH Agency Contacted: 03/22/24 Time HH Agency Contacted: 1520 Representative spoke with at [facility] Agency: Arna   Social Drivers of Health (SDOH) Interventions SDOH Screenings   Food Insecurity: No Food Insecurity (03/18/2024)  Housing: Low Risk (03/18/2024)  Transportation Needs: No Transportation Needs (03/18/2024)  Utilities: Not At Risk (03/18/2024)  Alcohol Screen: Low Risk (09/27/2023)  Depression (PHQ2-9): Low Risk (09/27/2023)  Financial Resource Strain: Low Risk (09/27/2023)  Physical Activity: Inactive (09/27/2023)  Social Connections: Socially Isolated (03/18/2024)  Stress: No Stress Concern Present (09/27/2023)  Tobacco Use: Low Risk (03/28/2024)  Health Literacy: Adequate Health Literacy (09/27/2023)    Readmission Risk Interventions    03/22/2024    3:09 PM  Readmission Risk Prevention Plan  Post Dischage Appt Complete  Medication Screening Complete  Transportation Screening Complete

## 2024-03-29 NOTE — Progress Notes (Signed)
 Foley discontinued @ 0550. Patient due to void.

## 2024-03-30 LAB — BASIC METABOLIC PANEL WITH GFR
Anion gap: 10 (ref 5–15)
BUN: 9 mg/dL (ref 8–23)
CO2: 20 mmol/L — ABNORMAL LOW (ref 22–32)
Calcium: 8.5 mg/dL — ABNORMAL LOW (ref 8.9–10.3)
Chloride: 108 mmol/L (ref 98–111)
Creatinine, Ser: 0.89 mg/dL (ref 0.44–1.00)
GFR, Estimated: >60 mL/min
Glucose, Bld: 154 mg/dL — ABNORMAL HIGH (ref 70–99)
Potassium: 4 mmol/L (ref 3.5–5.1)
Sodium: 138 mmol/L (ref 135–145)

## 2024-03-30 LAB — CBC
HCT: 27.9 % — ABNORMAL LOW (ref 36.0–46.0)
Hemoglobin: 7.9 g/dL — ABNORMAL LOW (ref 12.0–15.0)
MCH: 23.7 pg — ABNORMAL LOW (ref 26.0–34.0)
MCHC: 28.3 g/dL — ABNORMAL LOW (ref 30.0–36.0)
MCV: 83.5 fL (ref 80.0–100.0)
Platelets: 560 K/uL — ABNORMAL HIGH (ref 150–400)
RBC: 3.34 MIL/uL — ABNORMAL LOW (ref 3.87–5.11)
RDW: 17.6 % — ABNORMAL HIGH (ref 11.5–15.5)
WBC: 9.7 K/uL (ref 4.0–10.5)
nRBC: 0 % (ref 0.0–0.2)

## 2024-03-30 LAB — GLUCOSE, CAPILLARY
Glucose-Capillary: 102 mg/dL — ABNORMAL HIGH (ref 70–99)
Glucose-Capillary: 102 mg/dL — ABNORMAL HIGH (ref 70–99)
Glucose-Capillary: 148 mg/dL — ABNORMAL HIGH (ref 70–99)
Glucose-Capillary: 195 mg/dL — ABNORMAL HIGH (ref 70–99)
Glucose-Capillary: 277 mg/dL — ABNORMAL HIGH (ref 70–99)
Glucose-Capillary: 338 mg/dL — ABNORMAL HIGH (ref 70–99)

## 2024-03-30 MED ORDER — INSULIN ASPART 100 UNIT/ML IJ SOLN
0.0000 [IU] | Freq: Three times a day (TID) | INTRAMUSCULAR | Status: DC
  Administered 2024-03-30: 7 [IU] via SUBCUTANEOUS
  Administered 2024-03-30: 2 [IU] via SUBCUTANEOUS
  Administered 2024-03-31: 3 [IU] via SUBCUTANEOUS
  Filled 2024-03-30: qty 7
  Filled 2024-03-30: qty 3

## 2024-03-30 MED ORDER — INSULIN ASPART 100 UNIT/ML IJ SOLN
0.0000 [IU] | Freq: Every day | INTRAMUSCULAR | Status: DC
  Administered 2024-03-30: 3 [IU] via SUBCUTANEOUS
  Filled 2024-03-30: qty 3

## 2024-03-30 MED ORDER — HYDROMORPHONE HCL 1 MG/ML IJ SOLN: 0.5000 mg | INTRAMUSCULAR | Status: DC | PRN

## 2024-03-30 NOTE — Plan of Care (Signed)
   Problem: Education: Goal: Ability to describe self-care measures that may prevent or decrease complications (Diabetes Survival Skills Education) will improve Outcome: Progressing Goal: Individualized Educational Video(s) Outcome: Progressing

## 2024-03-30 NOTE — Care Management Important Message (Signed)
 Important Message  Patient Details IM Letter given. Name: Misty Cervantes MRN: [id number] Date of Birth: Feb 27, 1946   Important Message Given:  Yes - Medicare IM     [place], [place] 03/30/2024, 4:09 PM

## 2024-03-30 NOTE — Plan of Care (Signed)
   Problem: Coping: Goal: Ability to adjust to condition or change in health will improve Outcome: Progressing

## 2024-03-30 NOTE — Progress Notes (Addendum)
 " PROGRESS NOTE  Misty Cervantes  DOB: [birth year]  PCP: Lucius Krabbe, NP FMW:[id number]  DOA: 03/18/2024  LOS: 12 days  Hospital Day: 13  Subjective: Patient was seen and examined this morning. Sitting up in recliner.  Not in distress.  Granddaughter at bedside General surgery advanced from clear liquid to soft diet today Afebrile, hemodynamically stable, breathing on room air. Blood sugar level has been in controlled range in the last 24 hours  Brief narrative: Misty Cervantes is a 79 y.o. female with PMH significant for T2DM, HTN, HLD, CKD 3a, dysphagia, osteoarthritis, h/o gastric ulcers 1/4, patient presented to ED with complaint of weakness, nausea, and confusion.   She was admitted for sepsis secondary to UTI.  Urine culture and blood culture grew E. coli.  Treated with antibiotics.  Her hospital course was complicated by acute drop in hemoglobin.  1/10, underwent colonoscopy which showed potential malignant tumor at the ileocecal valve.  Biopsy resulted to show invasive colorectal adenocarcinoma 1/14, underwent laparoscopic proximal right colectomy with anastomosis See below for details . Assessment and plan: Invasive colorectal adenocarcinoma 1/10, underwent colonoscopy for evaluation of anemia.  It showed potential malignant tumor at the ileocecal valve.   1/10, CT scan did not show any cecal mass, only suggested right-sided colitis infectious versus inflammatory. 1/13, biopsy resulted to show invasive colorectal adenocarcinoma.  General surgery was consulted 1/14, underwent laparoscopic proximal right colectomy with anastomosis. Postop ileus resolved.  Patient able to tolerate clear liquid diet. 1/16, advance to soft diet.  General surgery tentatively plans for discharge tomorrow No need of further IV hydration I have communicated with oncology Dr. Timmy.  His office will set up an outpatient appointment for her.  Sepsis secondary to UTI  E coli bacteremia and  UTI Primarily admitted for sepsis secondary to UTI Blood culture as well as urine culture grew E. Coli. Repeat blood culture sent on 1/6 has not shown any growth so far. Initially improved with IV Rocephin  and later transitioned to oral Bactrim .  Completed 7-day course of antibiotics on 1/10. Recent Labs  Lab 03/25/24 0503 03/27/24 1219 03/28/24 0458 03/29/24 0459 03/30/24 1033  WBC 9.7 6.3 7.4 12.4* 9.7   Acute blood loss anemia  d/t colon cancer  chronic anemia Baseline hemoglobin from 2023 was 11.7. Hemoglobin at the lowest of 6.3 on 1/5.  Anemia panel showed low ferritin and low iron  level.  She received 1 unit of blood on 1/5 Hemoglobin has been stable between 7 and 8 since then. 1/10, colonoscopy was done and ultimately diagnosed to have colorectal cancer as discussed above.  Recent Labs    03/19/24 0431 03/19/24 1631 03/25/24 0503 03/27/24 1219 03/28/24 0458 03/29/24 0459 03/30/24 1033  HGB  --    < > 7.5* 7.5* 7.6* 7.4* 7.9*  MCV  --    < > 80.2 81.4 80.7 81.1 83.5  VITAMINB12 2,598*  --   --   --   --   --   --   FERRITIN 52  --   --   --   --   --   --   TIBC 357  --   --   --   --   --   --   IRON  <10*  --   --   --   --   --   --    < > = values in this interval not displayed.   Acute multiple encephalopathy  MCI (mild cognitive impairment) Was confused  at presentation likely due to UTI.  Family also has concerns about developing dementia at home. Had episodes of sundowning in the hospital. Mental status gradually improved to normal.  Now able to have meaningful conversation. Continue delirium precautions Outpatient neurology follow-up.  Type 2 diabetes mellitus uncontrolled with hyperglycemia A1c 10.7 on 02/28/2024 PTA meds-glipizide  10 mg BID, metformin  500 mg BID, Jardiance  Diabetes care coordinator consult appreciated Oral meds on hold.   Currently on SSI with Accu-Cheks.  Blood sugar level good this morning at 102. Recent Labs  Lab 03/29/24 1602  03/29/24 2044 03/29/24 2343 03/30/24 0344 03/30/24 0726  GLUCAP 256* 162* 141* 102* 102*   HTN Blood pressure currently controlled on losartan  50 mg daily. losartan  on hold perioperatively.   Continue to monitor blood pressure.  IV hydralazine  as needed  HLD Continue simvastatin  CKD 3A Creatinine stable at baseline Recent Labs  Lab 03/24/24 0520 03/25/24 0503 03/28/24 0458 03/29/24 0459 03/30/24 1033  NA 136 137 136 137 138  K 3.6 3.5 3.7 3.9 4.0  CL 103 105 103 110 108  CO2 20* 21* 18* 19* 20*  GLUCOSE 212* 70 244* 149* 154*  BUN 14 9 11 9 9   CREATININE 1.15* 1.18* 0.99 1.08* 0.89  CALCIUM  8.4* 8.5* 8.7* 8.3* 8.5*  MG  --   --   --  1.9  --    Generalized weakness In the setting of acute illness and dehydration PT/OT consulted.  Home health recommended. Fall precautions  Systolic ejection murmur Likely age-related aortic sclerosis, confirmed with echocardiogram 1/8. No need of further workup   Nutrition Status:         Mobility:   PT Orders: Active   PT Follow up Rec: Home Health Pt1/15/2026 1200    Goals of care   Code Status: Full Code     DVT prophylaxis:  enoxaparin  (LOVENOX ) injection 40 mg Start: 03/29/24 0800 SCD's Start: 03/28/24 1226 Place and maintain sequential compression device Start: 03/19/24 1332   Antimicrobials: Completed the course of Bactrim  Fluid: None Consultants: GI Family Communication: Family not at bedside.  Status: Inpatient Level of care:  Med-Surg   Patient is from: Home Needs to continue in-hospital care: POD 1 Anticipated d/c to: Ongoing workup.  Ultimately home with home health PT.  Hopefully home tomorrow    Diet:  Diet Order             DIET SOFT Room service appropriate? Yes; Fluid consistency: Thin  Diet effective now                   Scheduled Meds:  acetaminophen   1,000 mg Oral Q6H   enoxaparin  (LOVENOX ) injection  40 mg Subcutaneous Q24H   feeding supplement  237 mL Oral BID BM    gabapentin   200 mg Oral QHS   insulin  aspart  0-5 Units Subcutaneous QHS   insulin  aspart  0-9 Units Subcutaneous TID WC   ondansetron  (ZOFRAN ) IV  2 mg Intravenous Once   pantoprazole   40 mg Oral Daily   polycarbophil  625 mg Oral BID    PRN meds: alum & mag hydroxide-simeth, hydrALAZINE , HYDROmorphone  (DILAUDID ) injection, hydrOXYzine , magic mouthwash, melatonin, menthol , metoprolol  tartrate, naphazoline-glycerin , ondansetron  (ZOFRAN ) IV, oxyCODONE , phenol, prochlorperazine  **OR** prochlorperazine , senna-docusate, simethicone , simethicone , sodium chloride    Infusions:       Antimicrobials: Anti-infectives (From admission, onward)    Start     Dose/Rate Route Frequency Ordered Stop   03/28/24 2000  cefoTEtan  (CEFOTAN ) 2 g in sodium  chloride 0.9 % 100 mL IVPB        2 g 200 mL/hr over 30 Minutes Intravenous Every 12 hours 03/28/24 1226 03/28/24 2020   03/28/24 0600  cefoTEtan  (CEFOTAN ) 2 g in sodium chloride  0.9 % 100 mL IVPB        2 g 200 mL/hr over 30 Minutes Intravenous On call to O.R. 03/27/24 1131 03/28/24 0923   03/27/24 1400  neomycin  (MYCIFRADIN ) tablet 1,000 mg       Placed in And Linked Group   1,000 mg Oral 3 times per day 03/27/24 1131 03/27/24 2236   03/27/24 1400  metroNIDAZOLE  (FLAGYL ) tablet 1,000 mg       Placed in And Linked Group   1,000 mg Oral 3 times per day 03/27/24 1131 03/27/24 2234   03/21/24 2200  sulfamethoxazole -trimethoprim  (BACTRIM  DS) 800-160 MG per tablet 2 tablet        2 tablet Oral Every 12 hours 03/21/24 1448 03/24/24 2121   03/20/24 0600  cefTRIAXone  (ROCEPHIN ) 2 g in sodium chloride  0.9 % 100 mL IVPB  Status:  Discontinued        2 g 200 mL/hr over 30 Minutes Intravenous Every 24 hours 03/19/24 0717 03/21/24 1448   03/19/24 0815  cefTRIAXone  (ROCEPHIN ) 1 g in sodium chloride  0.9 % 100 mL IVPB        1 g 200 mL/hr over 30 Minutes Intravenous  Once 03/19/24 0717 03/19/24 0930   03/19/24 0500  cefTRIAXone  (ROCEPHIN ) 1 g in sodium  chloride 0.9 % 100 mL IVPB  Status:  Discontinued        1 g 200 mL/hr over 30 Minutes Intravenous Every 24 hours 03/18/24 2117 03/19/24 0717   03/18/24 1715  vancomycin  (VANCOREADY) IVPB 1500 mg/300 mL        1,500 mg 150 mL/hr over 120 Minutes Intravenous  Once 03/18/24 1642 03/18/24 2011   03/18/24 1645  ceFEPIme  (MAXIPIME ) 2 g in sodium chloride  0.9 % 100 mL IVPB        2 g 200 mL/hr over 30 Minutes Intravenous  Once 03/18/24 1640 03/18/24 1800   03/18/24 1645  metroNIDAZOLE  (FLAGYL ) IVPB 500 mg        500 mg 100 mL/hr over 60 Minutes Intravenous  Once 03/18/24 1640 03/18/24 1831   03/18/24 1645  vancomycin  (VANCOCIN ) IVPB 1000 mg/200 mL premix  Status:  Discontinued        1,000 mg 200 mL/hr over 60 Minutes Intravenous  Once 03/18/24 1640 03/18/24 1642       Objective: Vitals:   03/29/24 2042 03/30/24 0508  BP: 129/60 109/65  Pulse: 79 99  Resp: 17 15  Temp: (!) 97.3 F (36.3 C) 98.6 F (37 C)  SpO2: 98% 96%    Intake/Output Summary (Last 24 hours) at 03/30/2024 1133 Last data filed at 03/30/2024 1000 Gross per 24 hour  Intake 1230.5 ml  Output 1100 ml  Net 130.5 ml   Filed Weights   03/28/24 0748 03/29/24 0605 03/30/24 0500  Weight: 66 kg 66 kg 68 kg   Weight change: 2 kg Body mass index is 30.28 kg/m.   Physical Exam: General exam: Pleasant, elderly Caucasian female.  Not in distress.  Pain controlled. Skin: No rashes, lesions or ulcers. HEENT: Atraumatic, normocephalic, no obvious bleeding Lungs: Clear to auscultation bilaterally,  CVS: S1, S2, systolic ejection murmur GI/Abd: Soft, appropriate postop tenderness, nondistended, bowel sound present. CNS: Alert, awake, ordered x 3 Extremities: No pedal edema, no calf tenderness,  Data Review: I have personally reviewed the laboratory data and studies available.  F/u labs ordered Unresulted Labs (From admission, onward)     Start     Ordered   04/04/24 0500  Creatinine, serum  (enoxaparin  (LOVENOX )   CrCl >/= 30 mL/min  )  Weekly,   R     Comments: while on enoxaparin  therapy.    03/28/24 1226            Signed, Chapman Rota, MD [facility] 03/30/2024  "

## 2024-03-30 NOTE — Progress Notes (Signed)
 Occupational Therapy Treatment Patient Details Name: Misty Cervantes MRN: [id number] DOB: 1945-12-25 Today's Date: 03/30/2024   History of present illness Misty Cervantes is a 79 y.o. female who presented to the ED for evaluation of generalized weakness, nausea and confusion dizziness, fever, tachycardic, initiated sepsis protocol in ED. Dx- UTI/sepsis  Hospital course complicated by drop in hgb.  GI consulted and pt underwent colonoscopy on 1/10 showed potential tumor - awaiting biopsyPMH: r T2DM, HTN, HLD, osteoarthritis, CKD 3A, spinal stenosis and insomnia, THA, TKA   OT comments  Pt in recliner upon therapy arrival. Agreeable to participate in OT treatment focusing on functional mobility, endurance, and activity tolerance post op. Pt experiencing some abdominal soreness and was educated on utilizing pillow for bracing and using log roll technique during bed mobility. Pt was able to demonstrate understanding.       If plan is discharge home, recommend the following:  A little help with walking and/or transfers;A little help with bathing/dressing/bathroom;Assistance with cooking/housework;Assist for transportation;Help with stairs or ramp for entrance   Equipment Recommendations  None recommended by OT       Precautions / Restrictions Precautions Precautions: Fall Recall of Precautions/Restrictions: Intact Restrictions Weight Bearing Restrictions Per Provider Order: No       Mobility Bed Mobility Overal bed mobility: Needs Assistance Bed Mobility: Rolling, Sit to Sidelying Rolling: Supervision       Sit to sidelying: Min assist General bed mobility comments: Pt educated on log roll technique to minimize abdominal pain in addition to bracing with pillow.    Transfers Overall transfer level: Needs assistance Equipment used: Rolling walker (2 wheels) Transfers: Sit to/from Stand, Bed to chair/wheelchair/BSC Sit to Stand: Supervision     Step pivot transfers:  Supervision           Balance Overall balance assessment: Needs assistance Sitting-balance support: Feet supported, No upper extremity supported Sitting balance-Leahy Scale: Fair Sitting balance - Comments: limited by pain   Standing balance support: During functional activity, Bilateral upper extremity supported, No upper extremity supported Standing balance-Leahy Scale: Fair          ADL either performed or assessed with clinical judgement   ADL     Functional mobility during ADLs: Supervision/safety;Rolling walker (2 wheels) General ADL Comments: Pt completed functional mobility while utilizing RW to walk from room down past nurse's station and back with SBA. Pt provided with education on pacing and self monitoring for fatigue level. Did experience some fatigue and SOB during ambulation as she was walking and talking.              Communication Communication Communication: No apparent difficulties   Cognition Arousal: Alert Behavior During Therapy: WFL for tasks assessed/performed Cognition: No apparent impairments     Following commands: Intact        Cueing   Cueing Techniques: Verbal cues             Pertinent Vitals/ Pain       Pain Assessment Pain Assessment: Faces Faces Pain Scale: Hurts even more Pain Location: right side surgical site Pain Descriptors / Indicators: Aching, Sore, Grimacing, Guarding Pain Intervention(s): Limited activity within patient's tolerance, Monitored during session, Other (comment) (provided education for use of pillow during coughing/sneezing/laughing, and bed mobility)         Frequency  Min 1X/week        Progress Toward Goals  OT Goals(current goals can now be found in the care plan section)  Progress towards OT goals:  Progressing toward goals            AM-PAC OT 6 Clicks Daily Activity     Outcome Measure   Help from another person eating meals?: None Help from another person taking care of personal  grooming?: A Little Help from another person toileting, which includes using toliet, bedpan, or urinal?: A Little Help from another person bathing (including washing, rinsing, drying)?: A Little Help from another person to put on and taking off regular upper body clothing?: A Little Help from another person to put on and taking off regular lower body clothing?: A Little 6 Click Score: 19    End of Session Equipment Utilized During Treatment: Rolling walker (2 wheels)  OT Visit Diagnosis: Unsteadiness on feet (R26.81)   Activity Tolerance Patient tolerated treatment well;Patient limited by pain   Patient Left in bed;with call bell/phone within reach;with bed alarm set   Nurse Communication Mobility status        Time: 8487-8463 OT Time Calculation (min): 24 min  Charges: OT General Charges $OT Visit: 1 Visit OT Treatments $Therapeutic Activity: 23-37 mins  Leita Howell, OTR/L,CBIS  Supplemental OT - [facility] and [facility] Secure Chat Preferred    Karalyne Nusser, Leita BIRCH 03/30/2024, 4:48 PM

## 2024-03-30 NOTE — Progress Notes (Signed)
 03/30/2024  [name] [id number] [birth year]  CARE TEAM: PCP: Lucius Krabbe, NP  Outpatient Care Team: Patient Care Team: Lucius Krabbe, NP as PCP - General (Family Medicine) Melodi Lerner, MD as Consulting Physician (Orthopedic Surgery) Camillo Golas, MD as Consulting Physician (Ophthalmology) Jannis Kate Norris, MD as Consulting Physician (Obstetrics and Gynecology) Sheldon Standing, MD as Consulting Physician (General Surgery) Abran Norleen SAILOR, MD as Consulting Physician (Gastroenterology)  Inpatient Treatment Team: Treatment Team:  Arlice Reichert, MD Bobbette Cartwright, MD Ccs, Md, MD [place], Leita BIRCH, OT Sebastian Boyer, RN [place], [place], RPH [place], Rosina LABOR, RN Daye, Freddy BROCKS, NT [place], [place]   Problem List:   Principal Problem:   Sepsis secondary to UTI [facility]) Active Problems:   Hypertension associated with diabetes (HCC)   Hyperlipidemia associated with type 2 diabetes mellitus (HCC)   Type 2 diabetes mellitus with hyperglycemia, without long-term current use of insulin  (HCC)   Chronic kidney disease, stage 3a (HCC)   Microcytic anemia   Generalized weakness   QT prolongation   E coli bacteremia   MCI (mild cognitive impairment)   Melena   Acute blood loss anemia   History of peptic ulcer disease   Mass of cecum   Anemia due to chronic blood loss   Heme positive stool   Neoplasm of uncertain behavior of cecum   Ileocecal ulcer   Adenocarcinoma of ileocecal valve (HCC)   Cancer of ascending colon (HCC)   03/28/2024  Procedures: LAPAROSCOPIC PROXIMAL RIGHT COLECTOMY,    Assessment [facility] Stay = 12 days) 2 Days Post-Op    Slowly improving.    Assessment/Plan POD 2, s/p lap assisted R colectomy for adenocarcinoma, Dr. Sheldon, 1/14 -path pending -ADAT - soft diet -foley out POD#1 -mobilize, pulm toilet  -labs reviewed and stable   FEN - FLD, Ensure, SLIV VTE - Lovenox  ID - none currently   Recent UTI with E  coli bacteremia Anemia - stable today following OR DM HTN HLD CKD   I updated the patient's status to the patient, her granddaughter, her nurse at bedside.  Recommendations were made.  Questions were answered.  They expressed understanding & appreciation.  -Disposition: Patient slowly improving.  Probably okay to discharge from surgery standpoint tomorrow if meets goals.  We will see.     I reviewed nursing notes, hospitalist notes, last 24 h vitals and pain scores, last 48 h intake and output, last 24 h labs and trends, and last 24 h imaging results.  I have reviewed this patient's available data, including medical history, events of note, test results, etc as part of my evaluation.   A significant portion of that time was spent in counseling. Care during the described time interval was provided by me.  This care required moderate level of medical decision making.  03/30/2024    Subjective: (Chief complaint)  Patient walking room but is not one to walk in hallways. Had questions about gabapentin  and other meds.  I clarified. Nurse in room. Patient does not have much of an appetite and prefers to do liquids.  No nausea or vomiting though. Patient denies much pain  Objective:  Vital signs:  Vitals:   03/29/24 1326 03/29/24 2042 03/30/24 0500 03/30/24 0508  BP: 120/68 129/60  109/65  Pulse: 85 79  99  Resp: 18 17  15   Temp: 97.6 F (36.4 C) (!) 97.3 F (36.3 C)  98.6 F (37 C)  TempSrc: Oral Oral  Oral  SpO2: 99% 98%  96%  Weight:   68 kg   Height:        Last BM Date : 03/29/24  Intake/Output   Yesterday:  01/15 0701 - 01/16 0700 In: 1395.5 [P.O.:720; I.V.:675.5] Out: 1100 [Urine:1100] This shift:  No intake/output data recorded.  Bowel function:  Flatus: YES  BM:  No  Drain: (No drain)   Physical Exam:  General: Pt awake/alert in no acute distress Eyes: PERRL, normal EOM.  Sclera clear.  No icterus Neuro: CN II-XII intact w/o focal sensory/motor  deficits. Lymph: No head/neck/groin lymphadenopathy Psych:  No delerium/psychosis/paranoia.  Oriented x 4 HENT: Normocephalic, Mucus membranes moist.  No thrush Neck: Supple, No tracheal deviation.  No obvious thyromegaly Chest: No pain to chest wall compression.  Good respiratory excursion.  No audible wheezing CV:  Pulses intact.  Regular rhythm.  No major extremity edema MS: Normal AROM mjr joints.  No obvious deformity  Abdomen: Soft.  Nondistended.  Nontender.  No evidence of peritonitis.  No incarcerated hernias.  I remove the dressings.  Incisions clean dry and intact  Ext:   No deformity.  No mjr edema.  No cyanosis Skin: No petechiae / purpurea.  No major sores.  Warm and dry    Results:   Cultures: Recent Results (from the past 720 hours)  Blood Culture (routine x 2)     Status: Abnormal   Collection Time: 03/18/24  5:08 PM   Specimen: Right Antecubital; Blood  Result Value Ref Range Status   Specimen Description RIGHT ANTECUBITAL BLOOD  Final   Special Requests   Final    Blood Culture adequate volume BOTTLES DRAWN AEROBIC AND ANAEROBIC   Culture  Setup Time   Final    GRAM NEGATIVE RODS IN BOTH AEROBIC AND ANAEROBIC BOTTLES CRITICAL RESULT CALLED TO, READ BACK BY AND VERIFIED WITH:  LOUANN LABOR PHARM D 03/19/2024 BY DD @ [phone]  Performed at [facility], [address] [place], [place] [address]    Culture ESCHERICHIA COLI (A)  Final   Report Status 03/21/2024 FINAL  Final   Organism ID, Bacteria ESCHERICHIA COLI  Final      Susceptibility   Escherichia coli - MIC*    AMPICILLIN >=32 RESISTANT Resistant     CEFAZOLIN  (NON-URINE) 16 RESISTANT Resistant     CEFEPIME  <=0.12 SENSITIVE Sensitive     ERTAPENEM <=0.12 SENSITIVE Sensitive     CEFTRIAXONE  <=0.25 SENSITIVE Sensitive     CIPROFLOXACIN <=0.06 SENSITIVE Sensitive     GENTAMICIN <=1 SENSITIVE Sensitive     MEROPENEM <=0.25 SENSITIVE Sensitive     TRIMETH /SULFA  <=20 SENSITIVE Sensitive      AMPICILLIN/SULBACTAM 16 INTERMEDIATE Intermediate     PIP/TAZO Value in next row Sensitive      <=4 SENSITIVEThis is a modified FDA-approved test that has been validated and its performance characteristics determined by the reporting laboratory.  This laboratory is certified under the Clinical Laboratory Improvement Amendments CLIA as qualified to perform high complexity clinical laboratory testing.    * ESCHERICHIA COLI  Blood Culture ID Panel (Reflexed)     Status: Abnormal   Collection Time: 03/18/24  5:08 PM  Result Value Ref Range Status   Enterococcus faecalis NOT DETECTED NOT DETECTED Final   Enterococcus Faecium NOT DETECTED NOT DETECTED Final   Listeria monocytogenes NOT DETECTED NOT DETECTED Final   Staphylococcus species NOT DETECTED NOT DETECTED Final   Staphylococcus aureus (BCID) NOT DETECTED NOT DETECTED Final   Staphylococcus epidermidis NOT DETECTED NOT DETECTED Final  Staphylococcus lugdunensis NOT DETECTED NOT DETECTED Final   Streptococcus species NOT DETECTED NOT DETECTED Final   Streptococcus agalactiae NOT DETECTED NOT DETECTED Final   Streptococcus pneumoniae NOT DETECTED NOT DETECTED Final   Streptococcus pyogenes NOT DETECTED NOT DETECTED Final   A.calcoaceticus-baumannii NOT DETECTED NOT DETECTED Final   Bacteroides fragilis NOT DETECTED NOT DETECTED Final   Enterobacterales DETECTED (A) NOT DETECTED Final    Comment: Enterobacterales represent a large order of gram negative bacteria, not a single organism. CRITICAL RESULT CALLED TO, READ BACK BY AND VERIFIED WITH:  UTOMWEN A PHARM D 03/19/2024 BY DD @ 0706     Enterobacter cloacae complex NOT DETECTED NOT DETECTED Final   Escherichia coli DETECTED (A) NOT DETECTED Final    Comment: CRITICAL RESULT CALLED TO, READ BACK BY AND VERIFIED WITH:  UTOMWEN A PHARM D 03/19/2024 BY DD @ 0706     Klebsiella aerogenes NOT DETECTED NOT DETECTED Final   Klebsiella oxytoca NOT DETECTED NOT DETECTED Final   Klebsiella  pneumoniae NOT DETECTED NOT DETECTED Final   Proteus species NOT DETECTED NOT DETECTED Final   Salmonella species NOT DETECTED NOT DETECTED Final   Serratia marcescens NOT DETECTED NOT DETECTED Final   Haemophilus influenzae NOT DETECTED NOT DETECTED Final   Neisseria meningitidis NOT DETECTED NOT DETECTED Final   Pseudomonas aeruginosa NOT DETECTED NOT DETECTED Final   Stenotrophomonas maltophilia NOT DETECTED NOT DETECTED Final   Candida albicans NOT DETECTED NOT DETECTED Final   Candida auris NOT DETECTED NOT DETECTED Final   Candida glabrata NOT DETECTED NOT DETECTED Final   Candida krusei NOT DETECTED NOT DETECTED Final   Candida parapsilosis NOT DETECTED NOT DETECTED Final   Candida tropicalis NOT DETECTED NOT DETECTED Final   Cryptococcus neoformans/gattii NOT DETECTED NOT DETECTED Final   CTX-M ESBL NOT DETECTED NOT DETECTED Final   Carbapenem resistance IMP NOT DETECTED NOT DETECTED Final   Carbapenem resistance KPC NOT DETECTED NOT DETECTED Final   Carbapenem resistance NDM NOT DETECTED NOT DETECTED Final   Carbapenem resist OXA 48 LIKE NOT DETECTED NOT DETECTED Final   Carbapenem resistance VIM NOT DETECTED NOT DETECTED Final    Comment: Performed at [facility], [address], [place], [place] [address]  Urine Culture     Status: Abnormal   Collection Time: 03/18/24  5:32 PM   Specimen: Urine, Random  Result Value Ref Range Status   Specimen Description   Final    URINE, RANDOM Performed at [facility], [address], [place], [place] [address]    Special Requests   Final    NONE Reflexed from [id number] Performed at [facility], [address], [place], [place] [address]    Culture >=100,000 COLONIES/mL ESCHERICHIA COLI (A)  Final   Report Status 03/20/2024 FINAL  Final   Organism ID, Bacteria ESCHERICHIA COLI (A)  Final      Susceptibility   Escherichia coli - MIC*    AMPICILLIN >=32 RESISTANT Resistant     CEFAZOLIN   (URINE) Value in next row Sensitive      4 SENSITIVEThis is a modified FDA-approved test that has been validated and its performance characteristics determined by the reporting laboratory.  This laboratory is certified under the Clinical Laboratory Improvement Amendments CLIA as qualified to perform high complexity clinical laboratory testing.    CEFEPIME  Value in next row Sensitive      4 SENSITIVEThis is a modified FDA-approved test that has been validated and its performance characteristics  determined by the reporting laboratory.  This laboratory is certified under the Clinical Laboratory Improvement Amendments CLIA as qualified to perform high complexity clinical laboratory testing.    ERTAPENEM Value in next row Sensitive      4 SENSITIVEThis is a modified FDA-approved test that has been validated and its performance characteristics determined by the reporting laboratory.  This laboratory is certified under the Clinical Laboratory Improvement Amendments CLIA as qualified to perform high complexity clinical laboratory testing.    CEFTRIAXONE  Value in next row Sensitive      4 SENSITIVEThis is a modified FDA-approved test that has been validated and its performance characteristics determined by the reporting laboratory.  This laboratory is certified under the Clinical Laboratory Improvement Amendments CLIA as qualified to perform high complexity clinical laboratory testing.    CIPROFLOXACIN Value in next row Sensitive      4 SENSITIVEThis is a modified FDA-approved test that has been validated and its performance characteristics determined by the reporting laboratory.  This laboratory is certified under the Clinical Laboratory Improvement Amendments CLIA as qualified to perform high complexity clinical laboratory testing.    GENTAMICIN Value in next row Sensitive      4 SENSITIVEThis is a modified FDA-approved test that has been validated and its performance characteristics determined by the reporting  laboratory.  This laboratory is certified under the Clinical Laboratory Improvement Amendments CLIA as qualified to perform high complexity clinical laboratory testing.    NITROFURANTOIN  Value in next row Sensitive      4 SENSITIVEThis is a modified FDA-approved test that has been validated and its performance characteristics determined by the reporting laboratory.  This laboratory is certified under the Clinical Laboratory Improvement Amendments CLIA as qualified to perform high complexity clinical laboratory testing.    TRIMETH /SULFA  Value in next row Sensitive      4 SENSITIVEThis is a modified FDA-approved test that has been validated and its performance characteristics determined by the reporting laboratory.  This laboratory is certified under the Clinical Laboratory Improvement Amendments CLIA as qualified to perform high complexity clinical laboratory testing.    AMPICILLIN/SULBACTAM Value in next row Intermediate      4 SENSITIVEThis is a modified FDA-approved test that has been validated and its performance characteristics determined by the reporting laboratory.  This laboratory is certified under the Clinical Laboratory Improvement Amendments CLIA as qualified to perform high complexity clinical laboratory testing.    PIP/TAZO Value in next row Sensitive      <=4 SENSITIVEThis is a modified FDA-approved test that has been validated and its performance characteristics determined by the reporting laboratory.  This laboratory is certified under the Clinical Laboratory Improvement Amendments CLIA as qualified to perform high complexity clinical laboratory testing.    MEROPENEM Value in next row Sensitive      <=4 SENSITIVEThis is a modified FDA-approved test that has been validated and its performance characteristics determined by the reporting laboratory.  This laboratory is certified under the Clinical Laboratory Improvement Amendments CLIA as qualified to perform high complexity clinical  laboratory testing.    * >=100,000 COLONIES/mL ESCHERICHIA COLI  Blood Culture (routine x 2)     Status: None   Collection Time: 03/18/24 10:58 PM   Specimen: BLOOD  Result Value Ref Range Status   Specimen Description   Final    BLOOD SITE NOT SPECIFIED Performed at [facility], [address], [place], [place] [address]    Special Requests   Final    BOTTLES DRAWN  AEROBIC ONLY Blood Culture adequate volume Performed at [facility], [address] [place], [place] [address]    Culture   Final    NO GROWTH 5 DAYS Performed at [facility], [address] [place], [place] [address]    Report Status 03/24/2024 FINAL  Final  Culture, blood (Routine X 2) w Reflex to ID Panel     Status: None   Collection Time: 03/20/24  2:20 PM   Specimen: BLOOD RIGHT ARM  Result Value Ref Range Status   Specimen Description   Final    BLOOD RIGHT ARM Performed at [facility], [address], [place], [place] [address]    Special Requests   Final    BOTTLES DRAWN AEROBIC AND ANAEROBIC Blood Culture adequate volume Performed at [facility], [address], [place], [place] [address]    Culture   Final    NO GROWTH 5 DAYS Performed at [facility], [address], [place], [place] [address]    Report Status 03/25/2024 FINAL  Final  Culture, blood (Routine X 2) w Reflex to ID Panel     Status: None   Collection Time: 03/20/24  2:25 PM   Specimen: BLOOD RIGHT ARM  Result Value Ref Range Status   Specimen Description   Final    BLOOD RIGHT ARM Performed at [facility], [address], [place], [place] [address]    Special Requests   Final    BOTTLES DRAWN AEROBIC AND ANAEROBIC Blood Culture adequate volume Performed at [facility], [address], [place], [place] [address]    Culture   Final    NO GROWTH 5 DAYS Performed at [facility], [address], [place], [place]  [address]    Report Status 03/25/2024 FINAL  Final  Surgical pcr screen     Status: None   Collection Time: 03/27/24 10:56 PM   Specimen: Nasal Mucosa; Nasal Swab  Result Value Ref Range Status   MRSA, PCR NEGATIVE NEGATIVE Final   Staphylococcus aureus NEGATIVE NEGATIVE Final    Comment: (NOTE) The Xpert SA Assay (FDA approved for NASAL specimens in patients 39 years of age and older), is one component of a comprehensive surveillance program. It is not intended to diagnose infection nor to guide or monitor treatment. Performed at [facility], [address], [place], [place] [address]     Labs: Results for orders placed or performed during the hospital encounter of 03/18/24 (from the past 48 hours)  Glucose, capillary     Status: Abnormal   Collection Time: 03/28/24 11:47 AM  Result Value Ref Range   Glucose-Capillary 202 (H) 70 - 99 mg/dL    Comment: Glucose reference range applies only to samples taken after fasting for at least 8 hours.  Glucose, capillary     Status: Abnormal   Collection Time: 03/28/24 12:37 PM  Result Value Ref Range   Glucose-Capillary 227 (H) 70 - 99 mg/dL    Comment: Glucose reference range applies only to samples taken after fasting for at least 8 hours.  Glucose, capillary     Status: Abnormal   Collection Time: 03/28/24  4:59 PM  Result Value Ref Range   Glucose-Capillary 264 (H) 70 - 99 mg/dL    Comment: Glucose reference range applies only to samples taken after fasting for at least 8 hours.  Glucose, capillary     Status: Abnormal  Collection Time: 03/28/24  8:21 PM  Result Value Ref Range   Glucose-Capillary 299 (H) 70 - 99 mg/dL    Comment: Glucose reference range applies only to samples taken after fasting for at least 8 hours.  Glucose, capillary     Status: Abnormal   Collection Time: 03/29/24 12:31 AM  Result Value Ref Range   Glucose-Capillary 189 (H) 70 - 99 mg/dL    Comment: Glucose reference range applies only to  samples taken after fasting for at least 8 hours.  Glucose, capillary     Status: Abnormal   Collection Time: 03/29/24  4:34 AM  Result Value Ref Range   Glucose-Capillary 144 (H) 70 - 99 mg/dL    Comment: Glucose reference range applies only to samples taken after fasting for at least 8 hours.  Basic metabolic panel     Status: Abnormal   Collection Time: 03/29/24  4:59 AM  Result Value Ref Range   Sodium 137 135 - 145 mmol/L   Potassium 3.9 3.5 - 5.1 mmol/L   Chloride 110 98 - 111 mmol/L   CO2 19 (L) 22 - 32 mmol/L   Glucose, Bld 149 (H) 70 - 99 mg/dL    Comment: Glucose reference range applies only to samples taken after fasting for at least 8 hours.   BUN 9 8 - 23 mg/dL   Creatinine, Ser 8.91 (H) 0.44 - 1.00 mg/dL   Calcium  8.3 (L) 8.9 - 10.3 mg/dL   GFR, Estimated 52 (L) >60 mL/min    Comment: (NOTE) Calculated using the CKD-EPI Creatinine Equation (2021)    Anion gap 9 5 - 15    Comment: Performed at [facility], [address], [place], [place] [address]  CBC     Status: Abnormal   Collection Time: 03/29/24  4:59 AM  Result Value Ref Range   WBC 12.4 (H) 4.0 - 10.5 K/uL   RBC 3.02 (L) 3.87 - 5.11 MIL/uL   Hemoglobin 7.4 (L) 12.0 - 15.0 g/dL   HCT 75.4 (L) 63.9 - 53.9 %   MCV 81.1 80.0 - 100.0 fL   MCH 24.5 (L) 26.0 - 34.0 pg   MCHC 30.2 30.0 - 36.0 g/dL   RDW 83.0 (H) 88.4 - 84.4 %   Platelets 488 (H) 150 - 400 K/uL   nRBC 0.0 0.0 - 0.2 %    Comment: Performed at [facility], [address], [place], [place] [address]  Magnesium      Status: None   Collection Time: 03/29/24  4:59 AM  Result Value Ref Range   Magnesium  1.9 1.7 - 2.4 mg/dL    Comment: Performed at [facility], [address], [place], [place] [address]  Glucose, capillary     Status: Abnormal   Collection Time: 03/29/24 11:37 AM  Result Value Ref Range   Glucose-Capillary 253 (H) 70 - 99 mg/dL    Comment: Glucose reference range applies  only to samples taken after fasting for at least 8 hours.  Glucose, capillary     Status: Abnormal   Collection Time: 03/29/24  4:02 PM  Result Value Ref Range   Glucose-Capillary 256 (H) 70 - 99 mg/dL    Comment: Glucose reference range applies only to samples taken after fasting for at least 8 hours.  Glucose, capillary     Status: Abnormal   Collection Time: 03/29/24  8:44 PM  Result Value Ref Range   Glucose-Capillary 162 (H) 70 - 99 mg/dL  Comment: Glucose reference range applies only to samples taken after fasting for at least 8 hours.  Glucose, capillary     Status: Abnormal   Collection Time: 03/29/24 11:43 PM  Result Value Ref Range   Glucose-Capillary 141 (H) 70 - 99 mg/dL    Comment: Glucose reference range applies only to samples taken after fasting for at least 8 hours.  Glucose, capillary     Status: Abnormal   Collection Time: 03/30/24  3:44 AM  Result Value Ref Range   Glucose-Capillary 102 (H) 70 - 99 mg/dL    Comment: Glucose reference range applies only to samples taken after fasting for at least 8 hours.  Glucose, capillary     Status: Abnormal   Collection Time: 03/30/24  7:26 AM  Result Value Ref Range   Glucose-Capillary 102 (H) 70 - 99 mg/dL    Comment: Glucose reference range applies only to samples taken after fasting for at least 8 hours.    Imaging / Studies: No results found.  Medications / Allergies: per chart  Antibiotics: Anti-infectives (From admission, onward)    Start     Dose/Rate Route Frequency Ordered Stop   03/28/24 2000  cefoTEtan  (CEFOTAN ) 2 g in sodium chloride  0.9 % 100 mL IVPB        2 g 200 mL/hr over 30 Minutes Intravenous Every 12 hours 03/28/24 1226 03/28/24 2020   03/28/24 0600  cefoTEtan  (CEFOTAN ) 2 g in sodium chloride  0.9 % 100 mL IVPB        2 g 200 mL/hr over 30 Minutes Intravenous On call to O.R. 03/27/24 1131 03/28/24 0923   03/27/24 1400  neomycin  (MYCIFRADIN ) tablet 1,000 mg       Placed in And Linked Group    1,000 mg Oral 3 times per day 03/27/24 1131 03/27/24 2236   03/27/24 1400  metroNIDAZOLE  (FLAGYL ) tablet 1,000 mg       Placed in And Linked Group   1,000 mg Oral 3 times per day 03/27/24 1131 03/27/24 2234   03/21/24 2200  sulfamethoxazole -trimethoprim  (BACTRIM  DS) 800-160 MG per tablet 2 tablet        2 tablet Oral Every 12 hours 03/21/24 1448 03/24/24 2121   03/20/24 0600  cefTRIAXone  (ROCEPHIN ) 2 g in sodium chloride  0.9 % 100 mL IVPB  Status:  Discontinued        2 g 200 mL/hr over 30 Minutes Intravenous Every 24 hours 03/19/24 0717 03/21/24 1448   03/19/24 0815  cefTRIAXone  (ROCEPHIN ) 1 g in sodium chloride  0.9 % 100 mL IVPB        1 g 200 mL/hr over 30 Minutes Intravenous  Once 03/19/24 0717 03/19/24 0930   03/19/24 0500  cefTRIAXone  (ROCEPHIN ) 1 g in sodium chloride  0.9 % 100 mL IVPB  Status:  Discontinued        1 g 200 mL/hr over 30 Minutes Intravenous Every 24 hours 03/18/24 2117 03/19/24 0717   03/18/24 1715  vancomycin  (VANCOREADY) IVPB 1500 mg/300 mL        1,500 mg 150 mL/hr over 120 Minutes Intravenous  Once 03/18/24 1642 03/18/24 2011   03/18/24 1645  ceFEPIme  (MAXIPIME ) 2 g in sodium chloride  0.9 % 100 mL IVPB        2 g 200 mL/hr over 30 Minutes Intravenous  Once 03/18/24 1640 03/18/24 1800   03/18/24 1645  metroNIDAZOLE  (FLAGYL ) IVPB 500 mg        500 mg 100 mL/hr over 60 Minutes Intravenous  Once 03/18/24  1640 03/18/24 1831   03/18/24 1645  vancomycin  (VANCOCIN ) IVPB 1000 mg/200 mL premix  Status:  Discontinued        1,000 mg 200 mL/hr over 60 Minutes Intravenous  Once 03/18/24 1640 03/18/24 1642         Note: Portions of this report may have been transcribed using voice recognition software. Every effort was made to ensure accuracy; however, inadvertent computerized transcription errors may be present.   Any transcriptional errors that result from this process are unintentional.    Elspeth KYM Schultze, MD, FACS, MASCRS Esophageal, Gastrointestinal &  Colorectal Surgery Robotic and Minimally Invasive Surgery  [facility] [address] [place], [place] [address] [phone] Fax [phone] Main  CONTACT INFORMATION: Weekday (9AM-5PM): Call [facility] main office at [phone] Weeknight (5PM-9AM) or Weekend/Holiday: Check EPIC Web Links tab & use AMION (password  [email or web address]) for General Surgery [facility] coverage  Please, DO NOT use SecureChat  (it is not reliable communication to reach operating surgeons & will lead to a delay in care).   [facility] staff messaging available for outpatient concerns needing 1-2 business day response.      03/30/2024  9:53 AM

## 2024-03-31 ENCOUNTER — Other Ambulatory Visit (HOSPITAL_COMMUNITY): Payer: Self-pay

## 2024-03-31 LAB — GLUCOSE, CAPILLARY
Glucose-Capillary: 120 mg/dL — ABNORMAL HIGH (ref 70–99)
Glucose-Capillary: 228 mg/dL — ABNORMAL HIGH (ref 70–99)

## 2024-03-31 MED ORDER — PANTOPRAZOLE SODIUM 40 MG PO TBEC
40.0000 mg | DELAYED_RELEASE_TABLET | Freq: Every day | ORAL | 0 refills | Status: AC
  Filled 2024-03-31: qty 30, 30d supply, fill #0

## 2024-03-31 MED ORDER — CALCIUM POLYCARBOPHIL 625 MG PO TABS
625.0000 mg | ORAL_TABLET | Freq: Two times a day (BID) | ORAL | 1 refills | Status: AC
  Filled 2024-03-31: qty 60, 30d supply, fill #0

## 2024-03-31 MED ORDER — SIMETHICONE 80 MG PO CHEW
40.0000 mg | CHEWABLE_TABLET | Freq: Four times a day (QID) | ORAL | 0 refills | Status: AC | PRN
  Filled 2024-03-31: qty 30, 15d supply, fill #0

## 2024-03-31 MED ORDER — GABAPENTIN 100 MG PO CAPS
200.0000 mg | ORAL_CAPSULE | Freq: Every day | ORAL | 1 refills | Status: AC
  Filled 2024-03-31: qty 60, 30d supply, fill #0

## 2024-03-31 MED ORDER — ENSURE SURGERY PO LIQD
237.0000 mL | Freq: Two times a day (BID) | ORAL | 2 refills | Status: AC
  Filled 2024-03-31: qty 20000, 42d supply, fill #0

## 2024-03-31 MED ORDER — FERROUS SULFATE 325 (65 FE) MG PO TBEC
325.0000 mg | DELAYED_RELEASE_TABLET | Freq: Two times a day (BID) | ORAL | 3 refills | Status: AC
  Filled 2024-03-31: qty 100, 50d supply, fill #0

## 2024-03-31 MED ORDER — SALINE SPRAY 0.65 % NA SOLN
1.0000 | Freq: Four times a day (QID) | NASAL | 2 refills | Status: AC | PRN
  Filled 2024-03-31: qty 44, 110d supply, fill #0

## 2024-03-31 MED ORDER — OXYCODONE HCL 5 MG PO TABS
2.5000 mg | ORAL_TABLET | Freq: Four times a day (QID) | ORAL | 0 refills | Status: AC | PRN
  Filled 2024-03-31: qty 20, 5d supply, fill #0

## 2024-03-31 MED ORDER — SENNOSIDES-DOCUSATE SODIUM 8.6-50 MG PO TABS
1.0000 | ORAL_TABLET | Freq: Every evening | ORAL | 1 refills | Status: AC | PRN
  Filled 2024-03-31: qty 30, 30d supply, fill #0

## 2024-03-31 NOTE — Progress Notes (Signed)
 Physical Therapy Treatment Patient Details Name: Misty Cervantes MRN: [id number] DOB: [birth year] Today's Date: 03/31/2024   History of Present Illness Misty Cervantes is a 79 y.o. female who presented to the ED for evaluation of generalized weakness, nausea and confusion dizziness, fever, tachycardic, initiated sepsis protocol in ED. Dx- UTI/sepsis  Hospital course complicated by drop in hgb.  GI consulted and pt underwent colonoscopy on 1/10 showed potential tumor - awaiting biopsyPMH: r T2DM, HTN, HLD, osteoarthritis, CKD 3A, spinal stenosis and insomnia, THA, TKA    PT Comments  Pt received in bed very but arousable to tactile stimulation. Pt required SUP for rolling to R and minA for trunk elevation during sidelying to sit (logroll bed mobility) with HOB elevated, pt reporting grandson can help with this upon discharge. Pt guided through STS with and without RW with CGA, no physical assist. Pt guided through short-distance ambulation without device but pt furniture surfing, recommending use of RW upon discharge (pt has one at home). Discharge destination appropriate. Recommending ambulation with mobility specialist/nursing staff with RW each day she remains acutely. We will continue to follow.    If plan is discharge home, recommend the following: A little help with walking and/or transfers;Assist for transportation;A little help with bathing/dressing/bathroom;Assistance with cooking/housework   Can travel by private vehicle        Equipment Recommendations  None recommended by PT    Recommendations for Other Services       Precautions / Restrictions Precautions Precautions: Fall Recall of Precautions/Restrictions: Intact Precaution/Restrictions Comments: pt cannot Restrictions Weight Bearing Restrictions Per Provider Order: No     Mobility  Bed Mobility Overal bed mobility: Needs Assistance Bed Mobility: Rolling, Sit to Sidelying Rolling: Supervision Sidelying to sit: Min  assist     Sit to sidelying: Min assist General bed mobility comments: Pt reeducated on log roll technique to minimize abdominal pain in addition to bracing with pillow.    Transfers Overall transfer level: Needs assistance Equipment used: None Transfers: Sit to/from Stand Sit to Stand: Supervision   Step pivot transfers: Supervision       General transfer comment: For safety, no physical assist required    Ambulation/Gait Ambulation/Gait assistance: Contact guard assist Gait Distance (Feet): 15 Feet Assistive device: None Gait Pattern/deviations: Step-through pattern, Trunk flexed Gait velocity: decreased but functional     General Gait Details: CGA for safety, pt unsteady upon standing and reliant on furniture for safety   Stairs             Wheelchair Mobility     Tilt Bed    Modified Rankin (Stroke Patients Only)       Balance Overall balance assessment: Needs assistance Sitting-balance support: Feet supported, Bilateral upper extremity supported Sitting balance-Leahy Scale: Fair Sitting balance - Comments: limited by pain   Standing balance support: During functional activity, Single extremity supported Standing balance-Leahy Scale: Fair                              Hotel Manager: No apparent difficulties  Cognition Arousal: Lethargic Behavior During Therapy: WFL for tasks assessed/performed   PT - Cognitive impairments: Memory                         Following commands: Intact      Cueing Cueing Techniques: Verbal cues  Exercises      General Comments  Pertinent Vitals/Pain Pain Assessment Pain Assessment: 0-10 Pain Score: 5  Faces Pain Scale: No hurt Pain Location: right side surgical site Pain Descriptors / Indicators: Aching, Sore, Grimacing, Guarding    Home Living                          Prior Function            PT Goals (current goals can now  be found in the care plan section) Acute Rehab PT Goals Patient Stated Goal: get stronger and go home PT Goal Formulation: With patient Time For Goal Achievement: 04/03/24 Potential to Achieve Goals: Good Progress towards PT goals: Progressing toward goals    Frequency    Min 2X/week      PT Plan      Co-evaluation              AM-PAC PT 6 Clicks Mobility   Outcome Measure  Help needed turning from your back to your side while in a flat bed without using bedrails?: A Little Help needed moving from lying on your back to sitting on the side of a flat bed without using bedrails?: A Little Help needed moving to and from a bed to a chair (including a wheelchair)?: A Little Help needed standing up from a chair using your arms (e.g., wheelchair or bedside chair)?: A Little Help needed to walk in hospital room?: A Little Help needed climbing 3-5 steps with a railing? : A Little 6 Click Score: 18    End of Session Equipment Utilized During Treatment: Gait belt Activity Tolerance: Patient tolerated treatment well;No increased pain Patient left: with call bell/phone within reach;in chair;with chair alarm set Nurse Communication: Mobility status PT Visit Diagnosis: Muscle weakness (generalized) (M62.81);Unsteadiness on feet (R26.81)     Time: 9045-8987 PT Time Calculation (min) (ACUTE ONLY): 18 min  Charges:    $Therapeutic Activity: 8-22 mins PT General Charges $$ ACUTE PT VISIT: 1 Visit                    Elsie Grieves, PT, DPT [facility] Office: [phone]   Elsie Grieves 03/31/2024, 10:39 AM

## 2024-03-31 NOTE — Discharge Summary (Signed)
 " Physician Discharge Summary   Patient: Misty Cervantes MRN: [id number] DOB: 1945-07-02  Admit date:     03/18/2024  Discharge date: 03/31/24  Discharge Physician: Amaryllis Dare   PCP: Lucius Krabbe, NP   Recommendations at discharge:  Please obtain CBC and BMP and follow-up Follow-up with general surgery Follow-up with oncology Follow-up with primary care provider  Discharge Diagnoses: Principal Problem:   Sepsis secondary to UTI [facility]) Active Problems:   Type 2 diabetes mellitus with hyperglycemia, without long-term current use of insulin  (HCC)   Hypertension associated with diabetes (HCC)   Chronic kidney disease, stage 3a (HCC)   Hyperlipidemia associated with type 2 diabetes mellitus (HCC)   Microcytic anemia   Generalized weakness   QT prolongation   E coli bacteremia   MCI (mild cognitive impairment)   Melena   Acute blood loss anemia   History of peptic ulcer disease   Mass of cecum   Anemia due to chronic blood loss   Heme positive stool   Neoplasm of uncertain behavior of cecum   Ileocecal ulcer   Adenocarcinoma of ileocecal valve (HCC)   Cancer of ascending colon [facility])   Hospital Course: 78yo with h/o T2DM, HTN, HLD, and stage 3a CKD who presented on 1/4 with weakness, nausea, and confusion.  Fever to 102.6 with tachycardia, admitted with concern of sepsis secondary to E. coli UTI and bacteremia.  Completed the course of antibiotic with Rocephin  and later with Bactrim .  Patient was also found to have acute on chronic iron  deficiency anemia.  GI was consulted and patient underwent colonoscopy on 1/10 with a diagnosis of Colo cecal adenocarcinoma.  General surgery was consulted.  CT abdomen did not show any mass but did show some concern of colitis.  Patient underwent laparoscopic proximal right colectomy with anastomosis, did develop postoperative ileus which has been resolved. Pathology with Colo cecal adenocarcinoma stage I T2 N0, 53 lymph nodes were sampled  and they were all negative.  Oncology was consulted and patient will follow-up with Dr. Timmy as outpatient for further assistance.  Suspecting that she will just need survival pathway and no further intervention but oncology will decide.  Postoperative ileus resolved, patient was able to tolerate diet and having bowel movements.  Still having some soreness in belly which was improving and normal with her surgery.  Hemoglobin currently stable around 7.9.  Patient was also given some iron  supplement and oncology will take over from here.  Patient did develop some sundowning during hospitalization which has been improved and patient is currently at baseline.  Patient was evaluated by physical therapist and they recommended home health which was ordered.  Patient was found to have a systolic ejection murmur, likely age-related aortic stenosis which was confirmed with echocardiogram done on 03/22/2024.  No need for further workup and she can follow-up with cardiology as outpatient.  She will continue on current medications and need to have a close follow-up with her providers for further assistance.   Pain control -   Controlled Substance Reporting System database was reviewed. and patient was instructed, not to drive, operate heavy machinery, perform activities at heights, swimming or participation in water activities or provide baby-sitting services while on Pain, Sleep and Anxiety Medications; until their outpatient Physician has advised to do so again. Also recommended to not to take more than prescribed Pain, Sleep and Anxiety Medications.  Consultants: Gastroenterology.  General surgery Procedures performed: Colonoscopy.  Laparoscopic right proximal colectomy with anastomosis Disposition: Home health Diet  recommendation:  Cardiac and Carb modified diet DISCHARGE MEDICATION: Allergies as of 03/31/2024       Reactions   Bacid Other (See Comments)   Unknown    Latex Other (See  Comments)   Exact reaction not cited   Norvasc  [amlodipine ] Swelling        Medication List     TAKE these medications    Accu-Chek Guide test strip Generic drug: glucose blood USE 1 STRIP TWICE A DAY   Accu-Chek Softclix Lancets lancets CHECK BLOOD SUGARS 1-2 TIMES PER DAY.   aspirin  EC 81 MG tablet Take 81 mg by mouth daily. Swallow whole.   cetirizine 10 MG tablet Commonly known as: ZYRTEC Take 10 mg by mouth daily.   cyanocobalamin  1000 MCG tablet Commonly known as: VITAMIN B12 Take 1,000 mcg by mouth daily.   dimenhyDRINATE 50 MG tablet Commonly known as: DRAMAMINE Take 50 mg by mouth daily as needed for nausea or dizziness.   feeding supplement Liqd Take 237 mLs by mouth 2 (two) times daily between meals.   fluticasone  50 MCG/ACT nasal spray Commonly known as: FLONASE  SPRAY 1 SPRAY INTO EACH NOSTRIL TWICE A DAY AS NEEDED FOR ALLERGIES OR RHINITIS What changed: See the new instructions.   gabapentin  100 MG capsule Commonly known as: NEURONTIN  Take 2 capsules (200 mg total) by mouth at bedtime.   glipiZIDE  10 MG 24 hr tablet Commonly known as: GLUCOTROL  XL Take 1 tablet (10 mg total) by mouth 2 (two) times daily after a meal.   Jardiance  25 MG Tabs tablet Generic drug: empagliflozin  Take 25 mg by mouth daily. What changed: Another medication with the same name was removed. Continue taking this medication, and follow the directions you see here.   losartan  50 MG tablet Commonly known as: COZAAR  Take 1 tablet (50 mg total) by mouth daily.   metFORMIN  500 MG 24 hr tablet Commonly known as: GLUCOPHAGE -XR TAKE 1 TABLET BY MOUTH EVERY DAY WITH BREAKFAST.   multivitamin with minerals Tabs tablet Take 2 tablets by mouth daily.   oxyCODONE  5 MG immediate release tablet Commonly known as: Oxy IR/ROXICODONE  Take 0.5-1 tablets (2.5-5 mg total) by mouth every 6 (six) hours as needed for moderate pain (pain score 4-6), severe pain (pain score 7-10) or  breakthrough pain (5mg  for moderate pain, 10mg  for severe pain).   pantoprazole  40 MG tablet Commonly known as: PROTONIX  Take 1 tablet (40 mg total) by mouth daily. Start taking on: April 01, 2024   polycarbophil 625 MG tablet Commonly known as: FIBERCON Take 1 tablet (625 mg total) by mouth 2 (two) times daily.   pravastatin  40 MG tablet Commonly known as: PRAVACHOL  TAKE 1 TABLET BY MOUTH EVERYDAY AT BEDTIME   senna-docusate 8.6-50 MG tablet Commonly known as: Senokot-S Take 1 tablet by mouth at bedtime as needed for mild constipation.   simethicone  80 MG chewable tablet Commonly known as: MYLICON Chew 0.5 tablets (40 mg total) by mouth every 6 (six) hours as needed for flatulence (bloating).   sodium chloride  0.65 % Soln nasal spray Commonly known as: OCEAN Place 1-2 sprays into both nostrils every 6 (six) hours as needed for congestion (dry nose).   traZODone  50 MG tablet Commonly known as: DESYREL  Take 0.5-1 tablets (25-50 mg total) by mouth at bedtime as needed. for sleep   VITAMIN D PO Take 2 capsules by mouth daily.               Discharge Care Instructions  (From admission, onward)  Start     Ordered   03/31/24 0000  No dressing needed        03/31/24 1258            Contact information for follow-up providers     Sheldon Standing, MD Follow up on 04/23/2024.   Specialties: General Surgery, Colon and Rectal Surgery Why: 9:15am, Arrive 30 minutes prior to your appointment time, Please bring your insurance card and photo ID Contact information: [address] [place] [address] [phone]         Lucius Krabbe, NP. Schedule an appointment as soon as possible for a visit in 1 week(s).   Specialty: Family Medicine Contact information: [address] [place] [address] [phone]         Timmy Maude SAUNDERS, MD. Schedule an appointment as soon as possible for a visit in 1 week(s).   Specialty:  Oncology Contact information: [address] [place] [address] [phone]              Contact information for after-discharge care     Home Medical Care     [facility]) .   Service: Home Health Services Why: Ar[place] will provide PT in the home after discharge. Contact information: [address] [place]  [id number] [phone]                    Discharge Exam: Filed Weights   03/29/24 0605 03/30/24 0500 03/31/24 0500  Weight: 66 kg 68 kg 69 kg   General.  Frail and hard of hearing elderly lady, in no acute distress. Pulmonary.  Lungs clear bilaterally, normal respiratory effort. CV.  Regular rate and rhythm, no JVD, rub or murmur. Abdomen.  Soft, nontender, nondistended, BS positive. CNS.  Alert and oriented .  No focal neurologic deficit. Extremities.  No edema,  pulses intact and symmetrical. Psychiatry.  Judgment and insight appears normal.   Condition at discharge: stable  The results of significant diagnostics from this hospitalization (including imaging, microbiology, ancillary and laboratory) are listed below for reference.   Imaging Studies: CT CHEST ABDOMEN PELVIS W CONTRAST Result Date: 03/24/2024 CLINICAL DATA:  Ulcerative cecal mass. Rule out metastatic disease. * Tracking Code: BO * EXAM: CT CHEST, ABDOMEN, AND PELVIS WITH CONTRAST TECHNIQUE: Multidetector CT imaging of the chest, abdomen and pelvis was performed following the standard protocol during bolus administration of intravenous contrast. RADIATION DOSE REDUCTION: This exam was performed according to the departmental dose-optimization program which includes automated exposure control, adjustment of the mA and/or kV according to patient size and/or use of iterative reconstruction technique. CONTRAST:  80mL OMNIPAQUE  IOHEXOL  300 MG/ML  SOLN COMPARISON:  10/23/2023 chest CT. 08/23/2021 chest abdomen and pelvic CTs. FINDINGS: CT CHEST  FINDINGS Cardiovascular: Aortic atherosclerosis. Tortuous thoracic aorta. Mild cardiomegaly. Three vessel coronary artery calcification. No central pulmonary embolism, on this non-dedicated study. Mediastinum/Nodes: No supraclavicular adenopathy. No mediastinal or hilar adenopathy. Anterior mediastinal soft tissue density is similar to 2023 including on image 17/2 and favored to be related to degenerative change of the first rib/sternal joint. Lungs/Pleura: Mild cylindrical bronchiectasis is lower lobe predominant and likely postinfectious/inflammatory. Musculoskeletal: Included within the abdomen pelvic section. CT ABDOMEN PELVIS FINDINGS Hepatobiliary: Normal liver. Normal gallbladder, without biliary ductal dilatation. Pancreas: Normal, without mass or ductal dilatation. Spleen: Normal in size, without focal abnormality. Adrenals/Urinary Tract: Normal adrenal glands. Punctate interpolar left renal collecting system calculus. Left-greater-than-right renal sinus cysts, without  hydronephrosis. Degraded evaluation of the pelvis, secondary to beam hardening artifact from left hip arthroplasty. Grossly normal urinary bladder. Stomach/Bowel: Normal stomach, without wall thickening. The colon is primarily decompressed. There is mild mucosal hyperenhancement which is most apparent in the ascending segment, including on image 78/2. No well-defined cecal mass. Normal terminal ileum and appendix. Normal small bowel. Vascular/Lymphatic: Aortic atherosclerosis. Nodes adjacent the cecum of maximally 5 mm on image 81/2 are enlarged from maximally 3 mm previously. Reproductive: Hysterectomy.  No adnexal mass. Other: Moderate pelvic floor laxity. No free intraperitoneal air. No significant free fluid. No evidence of omental or peritoneal disease. Musculoskeletal: Left hip arthroplasty. Mild convex left thoracolumbar spine curvature. IMPRESSION: 1. Right-sided colitis which could be infectious or inflammatory. No well-defined cecal  mass identified. 2. Prominent but not pathologically sized ileocolic mesenteric nodes which could be reactive in the setting of colitis or represent regional nodal metastasis. 3. Otherwise, no evidence of metastatic disease. 4. Incidental findings, including: Coronary artery atherosclerosis. Aortic Atherosclerosis (ICD10-I70.0). Left nephrolithiasis. Electronically Signed   By: Rockey Kilts M.D.   On: 03/24/2024 17:45   ECHOCARDIOGRAM COMPLETE Result Date: 03/22/2024    ECHOCARDIOGRAM REPORT   Patient Name:   Misty Cervantes Date of Exam: 03/22/2024 Medical Rec #:  [id number]       Height:       59.0 in Accession #:    [id number]      Weight:       146.0 lb Date of Birth:  12/22/1945      BSA:          1.613 m Patient Age:    78 years        BP:           143/56 mmHg Patient Gender: F               HR:           89 bpm. Exam Location:  Inpatient Procedure: 2D Echo, Cardiac Doppler and Color Doppler (Both Spectral and Color            Flow Doppler were utilized during procedure). Indications:    Murmur R01.1  History:        Patient has no prior history of Echocardiogram examinations.                 Risk Factors:Diabetes.  Sonographer:    Sydnee Wilson RDCS Referring Phys: [id number] BINAYA DAHAL IMPRESSIONS  1. Left ventricular ejection fraction, by estimation, is 65 to 70%. The left ventricle has normal function. The left ventricle has no regional wall motion abnormalities. There is moderate asymmetric left ventricular hypertrophy of the basal-septal segment. Left ventricular diastolic parameters are consistent with Grade I diastolic dysfunction (impaired relaxation).  2. Right ventricular systolic function is normal. The right ventricular size is normal.  3. The mitral valve is normal in structure. Trivial mitral valve regurgitation. No evidence of mitral stenosis.  4. The aortic valve is tricuspid. There is mild calcification of the aortic valve. Aortic valve regurgitation is trivial. Aortic valve sclerosis is  present, with no evidence of aortic valve stenosis.  5. The inferior vena cava is normal in size with greater than 50% respiratory variability, suggesting right atrial pressure of 3 mmHg. Comparison(s): No prior Echocardiogram. Conclusion(s)/Recommendation(s): Otherwise normal echocardiogram, with minor abnormalities described in the report. FINDINGS  Left Ventricle: Left ventricular ejection fraction, by estimation, is 65 to 70%. The left ventricle has normal function. The left ventricle has no regional  wall motion abnormalities. The left ventricular internal cavity size was normal in size. There is  moderate asymmetric left ventricular hypertrophy of the basal-septal segment. Left ventricular diastolic parameters are consistent with Grade I diastolic dysfunction (impaired relaxation). Right Ventricle: The right ventricular size is normal. No increase in right ventricular wall thickness. Right ventricular systolic function is normal. Left Atrium: Left atrial size was normal in size. Right Atrium: Right atrial size was normal in size. Pericardium: There is no evidence of pericardial effusion. Mitral Valve: The mitral valve is normal in structure. Trivial mitral valve regurgitation. No evidence of mitral valve stenosis. Tricuspid Valve: The tricuspid valve is not well visualized. Tricuspid valve regurgitation is trivial. No evidence of tricuspid stenosis. Aortic Valve: The aortic valve is tricuspid. There is mild calcification of the aortic valve. Aortic valve regurgitation is trivial. Aortic valve sclerosis is present, with no evidence of aortic valve stenosis. Aortic valve mean gradient measures 8.5 mmHg. Aortic valve peak gradient measures 14.8 mmHg. Aortic valve area, by VTI measures 1.90 cm. Pulmonic Valve: The pulmonic valve was not well visualized. Pulmonic valve regurgitation is not visualized. No evidence of pulmonic stenosis. Aorta: The aortic root, ascending aorta and aortic arch are all structurally  normal, with no evidence of dilitation or obstruction. Venous: The inferior vena cava is normal in size with greater than 50% respiratory variability, suggesting right atrial pressure of 3 mmHg. IAS/Shunts: The atrial septum is grossly normal.  LEFT VENTRICLE PLAX 2D LVIDd:         3.00 cm     Diastology LVIDs:         1.90 cm     LV e' medial:    5.44 cm/s LV PW:         0.80 cm     LV E/e' medial:  13.5 LV IVS:        1.79 cm     LV e' lateral:   7.94 cm/s LVOT diam:     1.63 cm     LV E/e' lateral: 9.3 LV SV:         70 LV SV Index:   44 LVOT Area:     2.10 cm  LV Volumes (MOD) LV vol d, MOD A2C: 38.7 ml LV vol d, MOD A4C: 61.9 ml LV vol s, MOD A2C: 21.0 ml LV vol s, MOD A4C: 24.0 ml LV SV MOD A2C:     17.7 ml LV SV MOD A4C:     61.9 ml LV SV MOD BP:      27.3 ml RIGHT VENTRICLE RV S prime:     13.50 cm/s TAPSE (M-mode): 1.9 cm LEFT ATRIUM           Index        RIGHT ATRIUM          Index LA diam:      2.70 cm 1.67 cm/m   RA Area:     9.67 cm LA Vol (A2C): 24.4 ml 15.12 ml/m  RA Volume:   18.80 ml 11.65 ml/m LA Vol (A4C): 18.3 ml 11.34 ml/m  AORTIC VALVE AV Area (Vmax):    1.95 cm AV Area (Vmean):   1.95 cm AV Area (VTI):     1.90 cm AV Vmax:           192.25 cm/s AV Vmean:          137.500 cm/s AV VTI:            0.371 m AV Peak  Grad:      14.8 mmHg AV Mean Grad:      8.5 mmHg LVOT Vmax:         179.00 cm/s LVOT Vmean:        127.750 cm/s LVOT VTI:          0.336 m LVOT/AV VTI ratio: 0.91  AORTA Ao Root diam: 2.20 cm Ao Asc diam:  3.10 cm MITRAL VALVE MV Area (PHT): 2.34 cm    SHUNTS MV Decel Time: 324 msec    Systemic VTI:  0.34 m MV E velocity: 73.70 cm/s  Systemic Diam: 1.63 cm MV A velocity: 99.80 cm/s MV E/A ratio:  0.74 Bridgette Christopher MD Electronically signed by Shelda Bruckner MD Signature Date/Time: 03/22/2024/7:52:48 PM    Final    CT Head Wo Contrast Result Date: 03/18/2024 EXAM: CT HEAD WITHOUT 03/18/2024 06:52:20 PM TECHNIQUE: CT of the head was performed without the  administration of intravenous contrast. Automated exposure control, iterative reconstruction, and/or weight based adjustment of the mA/kV was utilized to reduce the radiation dose to as low as reasonably achievable. COMPARISON: 03/01/2019 CLINICAL HISTORY: Mental status change, unknown cause. FINDINGS: BRAIN AND VENTRICLES: No acute intracranial hemorrhage. No mass effect or midline shift. No extra-axial fluid collection. No evidence of acute infarct. No hydrocephalus. ORBITS: No acute abnormality. SINUSES AND MASTOIDS: No acute abnormality. SOFT TISSUES AND SKULL: No acute skull fracture. No acute soft tissue abnormality. IMPRESSION: 1. No acute intracranial abnormality. Electronically signed by: Franky Crease MD 03/18/2024 07:05 PM EST RP Workstation: [id number]   DG Chest Port 1 View Result Date: 03/18/2024 EXAM: 1 VIEW(S) XRAY OF THE CHEST 03/18/2024 04:50:00 PM COMPARISON: CT chest 10/23/2023. CLINICAL HISTORY: Questionable sepsis - evaluate for abnormality. FINDINGS: LUNGS AND PLEURA: Lungs are clear. No pleural effusion. No pneumothorax. HEART AND MEDIASTINUM: Prominent mediastinal shadow corresponding to prominent mediastinal fat on prior CT. BONES AND SOFT TISSUES: No acute osseous abnormality. IMPRESSION: 1. No acute cardiopulmonary findings. Electronically signed by: Elsie Gravely MD 03/18/2024 05:04 PM EST RP Workstation: [id number]    Microbiology: Results for orders placed or performed during the hospital encounter of 03/18/24  Blood Culture (routine x 2)     Status: Abnormal   Collection Time: 03/18/24  5:08 PM   Specimen: Right Antecubital; Blood  Result Value Ref Range Status   Specimen Description RIGHT ANTECUBITAL BLOOD  Final   Special Requests   Final    Blood Culture adequate volume BOTTLES DRAWN AEROBIC AND ANAEROBIC   Culture  Setup Time   Final    GRAM NEGATIVE RODS IN BOTH AEROBIC AND ANAEROBIC BOTTLES CRITICAL RESULT CALLED TO, READ BACK BY AND VERIFIED WITH:  LOUANN DELENA GOWER D 03/19/2024 BY DD @ [phone]  Performed at [facility], [address] [place], [place] [address]    Culture ESCHERICHIA COLI (A)  Final   Report Status 03/21/2024 FINAL  Final   Organism ID, Bacteria ESCHERICHIA COLI  Final      Susceptibility   Escherichia coli - MIC*    AMPICILLIN >=32 RESISTANT Resistant     CEFAZOLIN  (NON-URINE) 16 RESISTANT Resistant     CEFEPIME  <=0.12 SENSITIVE Sensitive     ERTAPENEM <=0.12 SENSITIVE Sensitive     CEFTRIAXONE  <=0.25 SENSITIVE Sensitive     CIPROFLOXACIN <=0.06 SENSITIVE Sensitive     GENTAMICIN <=1 SENSITIVE Sensitive     MEROPENEM <=0.25 SENSITIVE Sensitive     TRIMETH /SULFA  <=20 SENSITIVE Sensitive     AMPICILLIN/SULBACTAM 16 INTERMEDIATE Intermediate  PIP/TAZO Value in next row Sensitive      <=4 SENSITIVEThis is a modified FDA-approved test that has been validated and its performance characteristics determined by the reporting laboratory.  This laboratory is certified under the Clinical Laboratory Improvement Amendments CLIA as qualified to perform high complexity clinical laboratory testing.    * ESCHERICHIA COLI  Blood Culture ID Panel (Reflexed)     Status: Abnormal   Collection Time: 03/18/24  5:08 PM  Result Value Ref Range Status   Enterococcus faecalis NOT DETECTED NOT DETECTED Final   Enterococcus Faecium NOT DETECTED NOT DETECTED Final   Listeria monocytogenes NOT DETECTED NOT DETECTED Final   Staphylococcus species NOT DETECTED NOT DETECTED Final   Staphylococcus aureus (BCID) NOT DETECTED NOT DETECTED Final   Staphylococcus epidermidis NOT DETECTED NOT DETECTED Final   Staphylococcus lugdunensis NOT DETECTED NOT DETECTED Final   Streptococcus species NOT DETECTED NOT DETECTED Final   Streptococcus agalactiae NOT DETECTED NOT DETECTED Final   Streptococcus pneumoniae NOT DETECTED NOT DETECTED Final   Streptococcus pyogenes NOT DETECTED NOT DETECTED Final   A.calcoaceticus-baumannii NOT DETECTED NOT DETECTED Final    Bacteroides fragilis NOT DETECTED NOT DETECTED Final   Enterobacterales DETECTED (A) NOT DETECTED Final    Comment: Enterobacterales represent a large order of gram negative bacteria, not a single organism. CRITICAL RESULT CALLED TO, READ BACK BY AND VERIFIED WITH:  UTOMWEN A PHARM D 03/19/2024 BY DD @ 0706     Enterobacter cloacae complex NOT DETECTED NOT DETECTED Final   Escherichia coli DETECTED (A) NOT DETECTED Final    Comment: CRITICAL RESULT CALLED TO, READ BACK BY AND VERIFIED WITH:  UTOMWEN A PHARM D 03/19/2024 BY DD @ 0706     Klebsiella aerogenes NOT DETECTED NOT DETECTED Final   Klebsiella oxytoca NOT DETECTED NOT DETECTED Final   Klebsiella pneumoniae NOT DETECTED NOT DETECTED Final   Proteus species NOT DETECTED NOT DETECTED Final   Salmonella species NOT DETECTED NOT DETECTED Final   Serratia marcescens NOT DETECTED NOT DETECTED Final   Haemophilus influenzae NOT DETECTED NOT DETECTED Final   Neisseria meningitidis NOT DETECTED NOT DETECTED Final   Pseudomonas aeruginosa NOT DETECTED NOT DETECTED Final   Stenotrophomonas maltophilia NOT DETECTED NOT DETECTED Final   Candida albicans NOT DETECTED NOT DETECTED Final   Candida auris NOT DETECTED NOT DETECTED Final   Candida glabrata NOT DETECTED NOT DETECTED Final   Candida krusei NOT DETECTED NOT DETECTED Final   Candida parapsilosis NOT DETECTED NOT DETECTED Final   Candida tropicalis NOT DETECTED NOT DETECTED Final   Cryptococcus neoformans/gattii NOT DETECTED NOT DETECTED Final   CTX-M ESBL NOT DETECTED NOT DETECTED Final   Carbapenem resistance IMP NOT DETECTED NOT DETECTED Final   Carbapenem resistance KPC NOT DETECTED NOT DETECTED Final   Carbapenem resistance NDM NOT DETECTED NOT DETECTED Final   Carbapenem resist OXA 48 LIKE NOT DETECTED NOT DETECTED Final   Carbapenem resistance VIM NOT DETECTED NOT DETECTED Final    Comment: Performed at [facility], [address] [place], [place] [address]  Urine  Culture     Status: Abnormal   Collection Time: 03/18/24  5:32 PM   Specimen: Urine, Random  Result Value Ref Range Status   Specimen Description   Final    URINE, RANDOM Performed at [facility], [address], [place], [place] [address]    Special Requests   Final    NONE Reflexed from [id number] Performed at [facility],  [address], [place], [place] [address]    Culture >=100,000 COLONIES/mL ESCHERICHIA COLI (A)  Final   Report Status 03/20/2024 FINAL  Final   Organism ID, Bacteria ESCHERICHIA COLI (A)  Final      Susceptibility   Escherichia coli - MIC*    AMPICILLIN >=32 RESISTANT Resistant     CEFAZOLIN  (URINE) Value in next row Sensitive      4 SENSITIVEThis is a modified FDA-approved test that has been validated and its performance characteristics determined by the reporting laboratory.  This laboratory is certified under the Clinical Laboratory Improvement Amendments CLIA as qualified to perform high complexity clinical laboratory testing.    CEFEPIME  Value in next row Sensitive      4 SENSITIVEThis is a modified FDA-approved test that has been validated and its performance characteristics determined by the reporting laboratory.  This laboratory is certified under the Clinical Laboratory Improvement Amendments CLIA as qualified to perform high complexity clinical laboratory testing.    ERTAPENEM Value in next row Sensitive      4 SENSITIVEThis is a modified FDA-approved test that has been validated and its performance characteristics determined by the reporting laboratory.  This laboratory is certified under the Clinical Laboratory Improvement Amendments CLIA as qualified to perform high complexity clinical laboratory testing.    CEFTRIAXONE  Value in next row Sensitive      4 SENSITIVEThis is a modified FDA-approved test that has been validated and its performance characteristics determined by the reporting laboratory.  This laboratory is  certified under the Clinical Laboratory Improvement Amendments CLIA as qualified to perform high complexity clinical laboratory testing.    CIPROFLOXACIN Value in next row Sensitive      4 SENSITIVEThis is a modified FDA-approved test that has been validated and its performance characteristics determined by the reporting laboratory.  This laboratory is certified under the Clinical Laboratory Improvement Amendments CLIA as qualified to perform high complexity clinical laboratory testing.    GENTAMICIN Value in next row Sensitive      4 SENSITIVEThis is a modified FDA-approved test that has been validated and its performance characteristics determined by the reporting laboratory.  This laboratory is certified under the Clinical Laboratory Improvement Amendments CLIA as qualified to perform high complexity clinical laboratory testing.    NITROFURANTOIN  Value in next row Sensitive      4 SENSITIVEThis is a modified FDA-approved test that has been validated and its performance characteristics determined by the reporting laboratory.  This laboratory is certified under the Clinical Laboratory Improvement Amendments CLIA as qualified to perform high complexity clinical laboratory testing.    TRIMETH /SULFA  Value in next row Sensitive      4 SENSITIVEThis is a modified FDA-approved test that has been validated and its performance characteristics determined by the reporting laboratory.  This laboratory is certified under the Clinical Laboratory Improvement Amendments CLIA as qualified to perform high complexity clinical laboratory testing.    AMPICILLIN/SULBACTAM Value in next row Intermediate      4 SENSITIVEThis is a modified FDA-approved test that has been validated and its performance characteristics determined by the reporting laboratory.  This laboratory is certified under the Clinical Laboratory Improvement Amendments CLIA as qualified to perform high complexity clinical laboratory testing.    PIP/TAZO Value  in next row Sensitive      <=4 SENSITIVEThis is a modified FDA-approved test that has been validated and its performance characteristics determined by the reporting laboratory.  This laboratory is certified under the Clinical Laboratory Improvement Amendments CLIA  as qualified to perform high complexity clinical laboratory testing.    MEROPENEM Value in next row Sensitive      <=4 SENSITIVEThis is a modified FDA-approved test that has been validated and its performance characteristics determined by the reporting laboratory.  This laboratory is certified under the Clinical Laboratory Improvement Amendments CLIA as qualified to perform high complexity clinical laboratory testing.    * >=100,000 COLONIES/mL ESCHERICHIA COLI  Blood Culture (routine x 2)     Status: None   Collection Time: 03/18/24 10:58 PM   Specimen: BLOOD  Result Value Ref Range Status   Specimen Description   Final    BLOOD SITE NOT SPECIFIED Performed at [facility], [address], [place], [place] [address]    Special Requests   Final    BOTTLES DRAWN AEROBIC ONLY Blood Culture adequate volume Performed at [facility], [address], [place], [place] [address]    Culture   Final    NO GROWTH 5 DAYS Performed at [facility], [address], [place], [place] [address]    Report Status 03/24/2024 FINAL  Final  Culture, blood (Routine X 2) w Reflex to ID Panel     Status: None   Collection Time: 03/20/24  2:20 PM   Specimen: BLOOD RIGHT ARM  Result Value Ref Range Status   Specimen Description   Final    BLOOD RIGHT ARM Performed at [facility], [address] [place], [place] [address]    Special Requests   Final    BOTTLES DRAWN AEROBIC AND ANAEROBIC Blood Culture adequate volume Performed at [facility], [address], [place], [place] [address]    Culture   Final    NO GROWTH 5 DAYS Performed at [facility], [address],  [place], [place] [address]    Report Status 03/25/2024 FINAL  Final  Culture, blood (Routine X 2) w Reflex to ID Panel     Status: None   Collection Time: 03/20/24  2:25 PM   Specimen: BLOOD RIGHT ARM  Result Value Ref Range Status   Specimen Description   Final    BLOOD RIGHT ARM Performed at [facility], [address] [place], [place] [address]    Special Requests   Final    BOTTLES DRAWN AEROBIC AND ANAEROBIC Blood Culture adequate volume Performed at [facility], [address] [place], [place] [address]    Culture   Final    NO GROWTH 5 DAYS Performed at [facility], [address], [place], [place] [address]    Report Status 03/25/2024 FINAL  Final  Surgical pcr screen     Status: None   Collection Time: 03/27/24 10:56 PM   Specimen: Nasal Mucosa; Nasal Swab  Result Value Ref Range Status   MRSA, PCR NEGATIVE NEGATIVE Final   Staphylococcus aureus NEGATIVE NEGATIVE Final    Comment: (NOTE) The Xpert SA Assay (FDA approved for NASAL specimens in patients 70 years of age and older), is one component of a comprehensive surveillance program. It is not intended to diagnose infection nor to guide or monitor treatment. Performed at [facility], [address], [place], [place] [address]     Labs: CBC: Recent Labs  Lab 03/25/24 0503 03/27/24 1219 03/28/24 0458 03/29/24 0459 03/30/24 1033  WBC 9.7 6.3 7.4 12.4* 9.7  NEUTROABS 6.2  --  5.3  --   --  HGB 7.5* 7.5* 7.6* 7.4* 7.9*  HCT 25.1* 24.9* 26.0* 24.5* 27.9*  MCV 80.2 81.4 80.7 81.1 83.5  PLT 444* 466* 478* 488* 560*   Basic Metabolic Panel: Recent Labs  Lab 03/25/24 0503 03/28/24 0458 03/29/24 0459 03/30/24 1033  NA 137 136 137 138  K 3.5 3.7 3.9 4.0  CL 105 103 110 108  CO2 21* 18* 19* 20*  GLUCOSE 70 244* 149* 154*  BUN 9 11 9 9   CREATININE 1.18* 0.99 1.08* 0.89  CALCIUM  8.5* 8.7* 8.3* 8.5*  MG  --   --  1.9  --    Liver Function Tests: No  results for input(s): AST, ALT, ALKPHOS, BILITOT, PROT, ALBUMIN in the last 168 hours. CBG: Recent Labs  Lab 03/30/24 1641 03/30/24 1949 03/30/24 2356 03/31/24 0755 03/31/24 1139  GLUCAP 338* 277* 148* 120* 228*    Discharge time spent: greater than 30 minutes.  This record has been created using Conservation officer, historic buildings. Errors have been sought and corrected,but may not always be located. Such creation errors do not reflect on the standard of care.   Signed: Amaryllis Dare, MD [facility] 03/31/2024 "

## 2024-03-31 NOTE — Progress Notes (Signed)
 " PROGRESS NOTE  Misty Cervantes  DOB: 12/16/1945  PCP: Lucius Krabbe, NP FMW:[id number]  DOA: 03/18/2024  LOS: 13 days  Hospital Day: 14  Brief narrative: Misty Cervantes is a 79 y.o. female with PMH significant for T2DM, HTN, HLD, CKD 3a, dysphagia, osteoarthritis, h/o gastric ulcers 1/4, patient presented to ED with complaint of weakness, nausea, and confusion.   She was admitted for sepsis secondary to UTI.  Urine culture and blood culture grew E. coli.  Treated with antibiotics.  Her hospital course was complicated by acute drop in hemoglobin.  1/10, underwent colonoscopy which showed potential malignant tumor at the ileocecal valve.  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  Likely will just need survival pathway and no further intervention but oncology will decide.  PT recommending home health which was ordered.  Surgery would like to keep for 1 more day.  Subjective: Patient was sitting in chair when seen today.  Complaining of significant soreness in her belly.  Had a bowel movement yesterday.  Assessment and plan: Invasive colorectal adenocarcinoma 1/10, underwent colonoscopy for evaluation of anemia.  It showed potential malignant tumor at the ileocecal valve.   1/10, CT scan did not show any cecal mass, only suggested right-sided colitis infectious versus inflammatory. 1/13, biopsy resulted to show invasive colorectal adenocarcinoma.  General surgery was consulted 1/14, underwent laparoscopic proximal right colectomy with anastomosis. Postop ileus resolved.  Patient able to tolerate clear liquid diet. 1/16, advance to soft diet.  General surgery tentatively plans for discharge tomorrow No need of further IV hydration I have  communicated with oncology Dr. Timmy.  His office will set up an outpatient appointment for her. 1/17: Remains stable, still some soreness in belly.  Had a bowel movement.  Surgery would like to keep her for 1 more day to improve mobilization.  Sepsis secondary to UTI  E coli bacteremia and UTI Primarily admitted for sepsis secondary to UTI Blood culture as well as urine culture grew E. Coli. Repeat blood culture sent on 1/6 has not shown any growth so far. Initially improved with IV Rocephin  and later transitioned to oral Bactrim .  Completed 7-day course of antibiotics on 1/10. Recent Labs  Lab 03/25/24 0503 03/27/24 1219 03/28/24 0458 03/29/24 0459 03/30/24 1033  WBC 9.7 6.3 7.4 12.4* 9.7   Acute blood loss anemia  d/t colon cancer  chronic anemia Baseline hemoglobin from 2023 was 11.7. Hemoglobin at the lowest of 6.3 on 1/5.  Anemia panel showed low ferritin and low iron  level.  She received 1 unit of blood on 1/5 Hemoglobin has been stable between 7 and 8 since then. 1/10, colonoscopy was done and ultimately diagnosed to have colorectal cancer as discussed above.  Recent Labs    03/19/24 0431 03/19/24 1631 03/25/24 0503 03/27/24 1219 03/28/24 0458 03/29/24 0459 03/30/24 1033  HGB  --    < > 7.5* 7.5* 7.6* 7.4* 7.9*  MCV  --    < > 80.2 81.4 80.7 81.1 83.5  VITAMINB12 2,598*  --   --   --   --   --   --   FERRITIN 52  --   --   --   --   --   --   TIBC 357  --   --   --   --   --   --  IRON  <10*  --   --   --   --   --   --    < > = values in this interval not displayed.   Acute multiple encephalopathy  MCI (mild cognitive impairment) Was confused at presentation likely due to UTI.  Family also has concerns about developing dementia at home. Had episodes of sundowning in the hospital. Mental status gradually improved to normal.  Now able to have meaningful conversation. Continue delirium precautions Outpatient neurology follow-up.  Type 2 diabetes mellitus  uncontrolled with hyperglycemia A1c 10.7 on 02/28/2024 PTA meds-glipizide  10 mg BID, metformin  500 mg BID, Jardiance  Diabetes care coordinator consult appreciated Oral meds on hold.   Currently on SSI with Accu-Cheks.   HTN Blood pressure currently controlled on losartan  50 mg daily.  Continue to monitor blood pressure.  IV hydralazine  as needed  HLD Continue simvastatin  CKD 3A Creatinine stable at baseline Monitor renal function Avoid nephrotoxins  Generalized weakness In the setting of acute illness and dehydration PT/OT consulted.  Home health recommended and ordered Fall precautions  Systolic ejection murmur Likely age-related aortic sclerosis, confirmed with echocardiogram 1/8. No need of further workup    Mobility:   PT Orders: Active   PT Follow up Rec: Home Health Pt1/17/2026 1005    Goals of care   Code Status: Full Code     DVT prophylaxis: Lovenox     Antimicrobials: Completed the course of Bactrim  Fluid: None Consultants: GI, general surgery Family Communication: Family not at bedside.  Discussed with patient  Status: Inpatient Level of care:  Med-Surg   Patient is from: Home Anticipated d/c to: Home with home health, likely tomorrow    Diet:  Diet Order             DIET SOFT Room service appropriate? Yes; Fluid consistency: Thin  Diet effective now                   Scheduled Meds:  acetaminophen   1,000 mg Oral Q6H   enoxaparin  (LOVENOX ) injection  40 mg Subcutaneous Q24H   feeding supplement  237 mL Oral BID BM   gabapentin   200 mg Oral QHS   insulin  aspart  0-5 Units Subcutaneous QHS   insulin  aspart  0-9 Units Subcutaneous TID WC   ondansetron  (ZOFRAN ) IV  2 mg Intravenous Once   pantoprazole   40 mg Oral Daily   polycarbophil  625 mg Oral BID    PRN meds: alum & mag hydroxide-simeth, hydrALAZINE , HYDROmorphone  (DILAUDID ) injection, hydrOXYzine , magic mouthwash, melatonin, menthol , metoprolol  tartrate,  naphazoline-glycerin , ondansetron  (ZOFRAN ) IV, oxyCODONE , phenol, prochlorperazine  **OR** prochlorperazine , senna-docusate, simethicone , simethicone , sodium chloride    Infusions:   Antimicrobials: Anti-infectives (From admission, onward)    Start     Dose/Rate Route Frequency Ordered Stop   03/28/24 2000  cefoTEtan  (CEFOTAN ) 2 g in sodium chloride  0.9 % 100 mL IVPB        2 g 200 mL/hr over 30 Minutes Intravenous Every 12 hours 03/28/24 1226 03/28/24 2020   03/28/24 0600  cefoTEtan  (CEFOTAN ) 2 g in sodium chloride  0.9 % 100 mL IVPB        2 g 200 mL/hr over 30 Minutes Intravenous On call to O.R. 03/27/24 1131 03/28/24 0923   03/27/24 1400  neomycin  (MYCIFRADIN ) tablet 1,000 mg       Placed in And Linked Group   1,000 mg Oral 3 times per day 03/27/24 1131 03/27/24 2236   03/27/24 1400  metroNIDAZOLE  (FLAGYL ) tablet 1,000 mg  Placed in And Linked Group   1,000 mg Oral 3 times per day 03/27/24 1131 03/27/24 2234   03/21/24 2200  sulfamethoxazole -trimethoprim  (BACTRIM  DS) 800-160 MG per tablet 2 tablet        2 tablet Oral Every 12 hours 03/21/24 1448 03/24/24 2121   03/20/24 0600  cefTRIAXone  (ROCEPHIN ) 2 g in sodium chloride  0.9 % 100 mL IVPB  Status:  Discontinued        2 g 200 mL/hr over 30 Minutes Intravenous Every 24 hours 03/19/24 0717 03/21/24 1448   03/19/24 0815  cefTRIAXone  (ROCEPHIN ) 1 g in sodium chloride  0.9 % 100 mL IVPB        1 g 200 mL/hr over 30 Minutes Intravenous  Once 03/19/24 0717 03/19/24 0930   03/19/24 0500  cefTRIAXone  (ROCEPHIN ) 1 g in sodium chloride  0.9 % 100 mL IVPB  Status:  Discontinued        1 g 200 mL/hr over 30 Minutes Intravenous Every 24 hours 03/18/24 2117 03/19/24 0717   03/18/24 1715  vancomycin  (VANCOREADY) IVPB 1500 mg/300 mL        1,500 mg 150 mL/hr over 120 Minutes Intravenous  Once 03/18/24 1642 03/18/24 2011   03/18/24 1645  ceFEPIme  (MAXIPIME ) 2 g in sodium chloride  0.9 % 100 mL IVPB        2 g 200 mL/hr over 30 Minutes  Intravenous  Once 03/18/24 1640 03/18/24 1800   03/18/24 1645  metroNIDAZOLE  (FLAGYL ) IVPB 500 mg        500 mg 100 mL/hr over 60 Minutes Intravenous  Once 03/18/24 1640 03/18/24 1831   03/18/24 1645  vancomycin  (VANCOCIN ) IVPB 1000 mg/200 mL premix  Status:  Discontinued        1,000 mg 200 mL/hr over 60 Minutes Intravenous  Once 03/18/24 1640 03/18/24 1642       Objective: Vitals:   03/30/24 2001 03/31/24 0522  BP: 128/72 117/70  Pulse: 88 86  Resp: 18 18  Temp: 98.3 F (36.8 C) 99.1 F (37.3 C)  SpO2: 97%     Intake/Output Summary (Last 24 hours) at 03/31/2024 1242 Last data filed at 03/31/2024 1100 Gross per 24 hour  Intake 1560 ml  Output 700 ml  Net 860 ml   Filed Weights   03/29/24 0605 03/30/24 0500 03/31/24 0500  Weight: 66 kg 68 kg 69 kg   Weight change: 1 kg Body mass index is 30.72 kg/m.   Physical Exam: General.  Frail and hard of hearing elderly lady, in no acute distress. Pulmonary.  Lungs clear bilaterally, normal respiratory effort. CV.  Regular rate and rhythm, no JVD, rub or murmur. Abdomen.  Soft, mild diffuse tenderness due to surgery, nondistended, BS positive. CNS.  Alert and oriented .  No focal neurologic deficit. Extremities.  No edema,  pulses intact and symmetrical. Psychiatry.  Judgment and insight appears normal.   Data Review: I have personally reviewed the laboratory data and studies available.  F/u labs ordered Unresulted Labs (From admission, onward)     Start     Ordered   04/04/24 0500  Creatinine, serum  (enoxaparin  (LOVENOX )  CrCl >/= 30 mL/min  )  Weekly,   R     Comments: while on enoxaparin  therapy.    03/28/24 1226           This record has been created using Conservation officer, historic buildings. Errors have been sought and corrected,but may not always be located. Such creation errors do not reflect on the  standard of care.   Signed, Amaryllis Dare, MD [facility] 03/31/2024  "

## 2024-03-31 NOTE — Progress Notes (Signed)
 3 Days Post-Op lap R colectomy Subjective: Tolerating soft diet and having bowel function.  Feeling sore.  Having some weakness with ambulation  Objective: Vital signs in last 24 hours: Temp:  [97.8 F (36.6 C)-99.1 F (37.3 C)] 99.1 F (37.3 C) (01/17 0522) Pulse Rate:  [86-101] 86 (01/17 0522) Resp:  [18] 18 (01/17 0522) BP: (117-128)/(70-73) 117/70 (01/17 0522) SpO2:  [97 %] 97 % (01/16 2001) Weight:  [69 kg] 69 kg (01/17 0500)   Intake/Output from previous day: 01/16 0701 - 01/17 0700 In: 1560 [P.O.:1560] Out: 700 [Urine:700] Intake/Output this shift: No intake/output data recorded.   General appearance: alert and cooperative GI: soft, non-distended  Incision: no significant drainage  Lab Results:  Recent Labs    03/29/24 0459 03/30/24 1033  WBC 12.4* 9.7  HGB 7.4* 7.9*  HCT 24.5* 27.9*  PLT 488* 560*   BMET Recent Labs    03/29/24 0459 03/30/24 1033  NA 137 138  K 3.9 4.0  CL 110 108  CO2 19* 20*  GLUCOSE 149* 154*  BUN 9 9  CREATININE 1.08* 0.89  CALCIUM  8.3* 8.5*   PT/INR No results for input(s): LABPROT, INR in the last 72 hours. ABG No results for input(s): PHART, HCO3 in the last 72 hours.  Invalid input(s): PCO2, PO2  MEDS, Scheduled  acetaminophen   1,000 mg Oral Q6H   enoxaparin  (LOVENOX ) injection  40 mg Subcutaneous Q24H   feeding supplement  237 mL Oral BID BM   gabapentin   200 mg Oral QHS   insulin  aspart  0-5 Units Subcutaneous QHS   insulin  aspart  0-9 Units Subcutaneous TID WC   ondansetron  (ZOFRAN ) IV  2 mg Intravenous Once   pantoprazole   40 mg Oral Daily   polycarbophil  625 mg Oral BID    Studies/Results: No results found.  Assessment: s/p Procedures: LAPAROSCOPIC PROXIMAL RIGHT COLECTOMY, Patient Active Problem List   Diagnosis Date Noted   Cancer of ascending colon (HCC) 03/28/2024   Adenocarcinoma of ileocecal valve (HCC) 03/27/2024   Neoplasm of uncertain behavior of cecum 03/26/2024   Ileocecal  ulcer 03/26/2024   Mass of cecum 03/24/2024   Anemia due to chronic blood loss 03/24/2024   Heme positive stool 03/24/2024   Melena 03/22/2024   Acute blood loss anemia 03/22/2024   History of peptic ulcer disease 03/22/2024   MCI (mild cognitive impairment) 03/20/2024   E coli bacteremia 03/19/2024   Sepsis secondary to UTI (HCC) 03/18/2024   Microcytic anemia 03/18/2024   Generalized weakness 03/18/2024   QT prolongation 03/18/2024   Pain in left wrist 11/03/2023   Primary insomnia 12/07/2021   Pneumonia of both lower lobes due to infectious organism    Chronic kidney disease, stage 3a (HCC)    Spinal stenosis of lumbar region 05/13/2020   Aortic atherosclerosis 06/22/2019   Type 2 diabetes mellitus with hyperglycemia, without long-term current use of insulin  (HCC) 06/18/2016   OA (osteoarthritis) of knee 11/03/2015   Hyperlipidemia associated with type 2 diabetes mellitus (HCC) 07/23/2014   Genu valgum 08/11/2012   Hypertension associated with diabetes (HCC) 02/25/2010    Expected post operative course  Plan: Plan for discharge tomorrow Pt will work on better mobilization today with plans for discharge tomorrow if still meeting her goals of recovery.  She does live with a grandson who can help her at home   LOS: 13 days     .Misty JAYSON Ned, MD [facility], Misty Cervantes    03/31/2024 8:21 AM

## 2024-03-31 NOTE — TOC Transition Note (Signed)
 Transition of Care [facility]) - Discharge Note   Patient Details  Name: Misty Cervantes MRN: [id number] Date of Birth: Dec 29, 1945  [facility] CM/SW Contact:  Doneta Glenys DASEN, RN Phone Number: 03/31/2024, 1:26 PM   Clinical Narrative:    Patient discharge with [facility]. IP CM signing off   Final next level of care: Home w Home Health Services Barriers to Discharge: Barriers Resolved   Patient Goals and CMS Choice Patient states their goals for this hospitalization and ongoing recovery are:: Home with [facility] CMS Medicare.gov Compare Post Acute Care list provided to:: Patient Choice offered to / list presented to : Patient [place] ownership interest in [facility].provided to:: Patient    Discharge Placement                       Discharge Plan and Services Additional resources added to the After Visit Summary for   In-house Referral: Clinical Social Work   Post Acute Care Choice: Home Health          DME Arranged: N/A DME Agency: NA       HH Arranged: PT HH Agency: [facility] Date HH Agency Contacted: 03/22/24 Time HH Agency Contacted: 1520 Representative spoke with at [facility] Agency: Arna  Social Drivers of Health (SDOH) Interventions SDOH Screenings   Food Insecurity: No Food Insecurity (03/18/2024)  Housing: Low Risk (03/18/2024)  Transportation Needs: No Transportation Needs (03/18/2024)  Utilities: Not At Risk (03/18/2024)  Alcohol Screen: Low Risk (09/27/2023)  Depression (PHQ2-9): Low Risk (09/27/2023)  Financial Resource Strain: Low Risk (09/27/2023)  Physical Activity: Inactive (09/27/2023)  Social Connections: Socially Isolated (03/18/2024)  Stress: No Stress Concern Present (09/27/2023)  Tobacco Use: Low Risk (03/28/2024)  Health Literacy: Adequate Health Literacy (09/27/2023)     Readmission Risk Interventions    03/22/2024    3:09 PM  Readmission Risk Prevention Plan  Post Dischage Appt Complete  Medication Screening  Complete  Transportation Screening Complete

## 2024-03-31 NOTE — Progress Notes (Signed)
 Rock CHRISTELLA Meissner to be D/C'd Home with home health per MD order.  Discussed with the patient and all questions fully answered.  IV catheter discontinued intact. Site without signs and symptoms of complications. Dressing and pressure applied.  An After Visit Summary was printed and given to the patient. Patient awaiting prescriptions to be delivered from pharmacy.  D/c education completed with patient/family including follow up instructions, medication list, d/c activities limitations if indicated, with other d/c instructions as indicated by MD - patient able to verbalize understanding, all questions fully answered.   Patient instructed to return to ED, call 911, or call MD for any changes in condition.   Patient granddaughter at bedside for transport home once medication has been delivered.  Edna Grover L Lowella Kindley 03/31/2024 1:25 PM

## 2024-03-31 NOTE — Progress Notes (Signed)
 Patient with anemia found to have bleeding colon mass.  Underwent proximal right colectomy.  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  53 lymph nodes sampled and all are negative.  Discussed with patient and copy in room.  Suspect patient will just need survival pathway and no further intervention.

## 2024-03-31 NOTE — Progress Notes (Signed)
 Discharge meds in a secure bag delivered to patient by this RN

## 2024-04-02 ENCOUNTER — Other Ambulatory Visit (HOSPITAL_COMMUNITY): Payer: Self-pay

## 2024-04-02 ENCOUNTER — Telehealth: Payer: Self-pay

## 2024-04-02 NOTE — Transitions of Care (Post Inpatient/ED Visit) (Signed)
" ° °  04/02/2024  Name: Misty Cervantes MRN: [id number] DOB: Nov 01, 1945  Today's TOC FU Call Status: Today's TOC FU Call Status:: Unsuccessful Call (2nd Attempt) Unsuccessful Call (2nd Attempt) Date: 04/02/24  Attempted to reach the patient regarding the most recent Inpatient/ED visit.  Follow Up Plan: Additional outreach attempts will be made to reach the patient to complete the Transitions of Care (Post Inpatient/ED visit) call.   Alan Ee, RN, BSN, CEN Population Health- Transition of Care Team.  [facility] [phone]  "

## 2024-04-02 NOTE — Transitions of Care (Post Inpatient/ED Visit) (Signed)
" ° °  04/02/2024  Name: Misty Cervantes MRN: [id number] DOB: 1945/05/15  Today's TOC FU Call Status: Today's TOC FU Call Status:: Unsuccessful Call (1st Attempt) Unsuccessful Call (1st Attempt) Date: 04/02/24  Attempted to reach the patient regarding the most recent Inpatient/ED visit.  Follow Up Plan: Additional outreach attempts will be made to reach the patient to complete the Transitions of Care (Post Inpatient/ED visit) call.   Alan Ee, RN, BSN, CEN Population Health- Transition of Care Team.  [facility] [phone]  "

## 2024-04-03 ENCOUNTER — Telehealth: Payer: Self-pay | Admitting: *Deleted

## 2024-04-03 ENCOUNTER — Telehealth: Payer: Self-pay

## 2024-04-03 ENCOUNTER — Encounter: Payer: Self-pay | Admitting: *Deleted

## 2024-04-03 NOTE — Progress Notes (Signed)
 Called and spoke to patient about post hospital follow up for new cancer diagnosis. She [place] the corner from [place] and requests that the referral be transferred to that location. Information sent to [facility] New Nurse Navigator for that location.   Oncology Nurse Navigator Documentation     04/03/2024    3:00 PM  Oncology Nurse Navigator Flowsheets  Navigator Location [facility]  Navigator Encounter Type Introductory Phone Call  Time Spent with Patient 15

## 2024-04-03 NOTE — Telephone Encounter (Signed)
 Left voicemail for pt to call back for scheduling

## 2024-04-03 NOTE — Transitions of Care (Post Inpatient/ED Visit) (Signed)
" ° °  04/03/2024  Name: Misty Cervantes MRN: [id number] DOB: 11/21/1945  Today's TOC FU Call Status: Today's TOC FU Call Status:: Successful TOC FU Call Completed TOC FU Call Complete Date: 04/03/24  Patient's Name and Date of Birth confirmed. Name, DOB  Transition Care Management Follow-up Telephone Call Date of Discharge: 03/31/24 Discharge Facility: [facility]) Primary Inpatient Discharge Diagnosis:: Sepsis secondary to UTI How have you been since you were released from the hospital?: Better Any questions or concerns?: No  Items Reviewed: Did you receive and understand the discharge instructions provided?: Yes Medications obtained,verified, and reconciled?: Yes (Medications Reviewed) Any new allergies since your discharge?: No Dietary orders reviewed?: No Do you have support at home?: Yes People in Home [RPT]: grandchild(ren)  Medications Reviewed Today: Medications Reviewed Today   Medications were not reviewed in this encounter     Home Care and Equipment/Supplies: Were Home Health Services Ordered?: Yes Name of Home Health Agency:: [facility] Has Agency set up a time to come to your home?: No Any new equipment or medical supplies ordered?: No  Functional Questionnaire: Do you need assistance with bathing/showering or dressing?: No Do you need assistance with meal preparation?: No Do you need assistance with eating?: No Do you have difficulty maintaining continence: No Do you need assistance with getting out of bed/getting out of a chair/moving?: No Do you have difficulty managing or taking your medications?: No  Follow up appointments reviewed: PCP Follow-up appointment confirmed?: No (will call today and schedule when she is home) MD Provider Line [phone] Given: No Specialist Hospital Follow-up appointment confirmed?: Yes Date of Specialist follow-up appointment?: 04/06/24 Follow-Up Specialty Provider:: Dr. Timmy Do you need transportation to  your follow-up appointment?: No Do you understand care options if your condition(s) worsen?: Yes-patient verbalized understanding  230pm.  Placed call to patient who is having a hard time hearing on the phone. Patient request a call back in about an hour to review medications. She reports that her granddaughter took home her instruction packet.   3:33pm  call back to patient with no answer.   Alan Ee, RN, BSN, CEN Population Health- Transition of Care Team.  [facility] [phone]  "

## 2024-04-04 ENCOUNTER — Telehealth: Payer: Self-pay | Admitting: *Deleted

## 2024-04-04 ENCOUNTER — Other Ambulatory Visit: Payer: Self-pay | Admitting: *Deleted

## 2024-04-04 DIAGNOSIS — C182 Malignant neoplasm of ascending colon: Secondary | ICD-10-CM

## 2024-04-04 LAB — SURGICAL PATHOLOGY

## 2024-04-04 NOTE — Telephone Encounter (Signed)
 Left detailed message for pt to call back to discuss scheduling New pt appt

## 2024-04-05 ENCOUNTER — Telehealth: Payer: Self-pay

## 2024-04-05 NOTE — Transitions of Care (Post Inpatient/ED Visit) (Signed)
" ° °  04/05/2024  Name: Misty Cervantes MRN: [id number] DOB: 29-Oct-1945  Today's TOC FU Call Status: Today's TOC FU Call Status:: Successful TOC FU Call Completed TOC FU Call Complete Date: 04/05/24  Patient's Name and Date of Birth confirmed. Name, DOB  Transition Care Management Follow-up Telephone Call Date of Discharge: 03/31/24 Discharge Facility: [facility]) Type of Discharge: Inpatient Admission Primary Inpatient Discharge Diagnosis:: Sepsis secondary to UTI  Items Reviewed:  Placed 4th attempt to patient to finish TOC call from 2 days ago.  Patient is very hard of hearing.  She states that she has her instructions and her medications.  States that she does not need to review them. States that she has friends and grandchildren assisting her.    Reports that she was supposed to go to the cancer center tomorrow but wanted to go to [facility]. Reports she was waiting for someone to call her back. I reviewed the EMR and noted that Sari New  RN has left numerous messages.  Patient does not know this. I encouraged patient to listen to her messages. I provided patient phone number to call.  I sent a message in teams to Sari Jules to inquire if she can call patient about appointment.     Note: patient decline medication review ( verbally states that she has her medications) and declines to complete TOC call.   Alan Ee, RN, BSN, CEN Population Health- Transition of Care Team.  [facility] [phone]        "

## 2024-04-06 ENCOUNTER — Inpatient Hospital Stay

## 2024-04-06 ENCOUNTER — Inpatient Hospital Stay: Admitting: Hematology & Oncology

## 2024-04-06 ENCOUNTER — Encounter: Payer: Self-pay | Admitting: *Deleted

## 2024-04-06 NOTE — Progress Notes (Signed)
 PATIENT NAVIGATOR PROGRESS NOTE  Name: BREANAH FADDIS Date: 04/06/2024 MRN: [id number]  DOB: [birth year]   Reason for visit:  Introductory phone call  Comments:  Spoke with pt and have scheduled her with Dr Cloretta on 04/20/23 at 1:40pm. Reviewed directions to building and parking and given contact information to call with any questions    Time spent counseling/coordinating care: 30-45 minutes

## 2024-04-10 ENCOUNTER — Telehealth: Payer: Self-pay | Admitting: Family

## 2024-04-10 NOTE — Telephone Encounter (Signed)
 [facility] faxed Home Health Certificate (Order [place] [id number]), to be filled out by provider. [facility] requested to send it back via Fax within ASAP. Document is located in providers tray at front office.Please advise at [phone].

## 2024-04-11 ENCOUNTER — Encounter (HOSPITAL_COMMUNITY): Payer: Self-pay

## 2024-04-11 NOTE — Telephone Encounter (Signed)
Received and placed on providers desk

## 2024-04-19 ENCOUNTER — Inpatient Hospital Stay

## 2024-04-19 ENCOUNTER — Inpatient Hospital Stay: Admitting: Family

## 2024-04-19 ENCOUNTER — Inpatient Hospital Stay: Admitting: Oncology

## 2024-04-19 ENCOUNTER — Encounter: Payer: Self-pay | Admitting: *Deleted

## 2024-04-19 NOTE — Progress Notes (Signed)
 PATIENT NAVIGATOR PROGRESS NOTE  Name: Misty Cervantes Date: 04/19/2024 MRN: [id number]  DOB: 1945-03-29   Reason for visit:  Pt called and unable to get to appt due to weather   Comments:  Rescheduled with Dr Cloretta on 05/02/24 at 1:10pm     Time spent counseling/coordinating care: 15-30 minutes

## 2024-04-23 ENCOUNTER — Inpatient Hospital Stay: Admitting: Family

## 2024-05-02 ENCOUNTER — Inpatient Hospital Stay

## 2024-05-02 ENCOUNTER — Inpatient Hospital Stay: Admitting: Oncology

## 2024-10-02 ENCOUNTER — Ambulatory Visit
